# Patient Record
Sex: Female | Born: 1935 | Race: Black or African American | Hispanic: No | Smoking: Former smoker
Health system: Southern US, Community
[De-identification: ages and names within clinical notes are randomized; demographics above are authoritative.]

## PROBLEM LIST (undated history)

## (undated) DIAGNOSIS — N3941 Urge incontinence: Secondary | ICD-10-CM

## (undated) DIAGNOSIS — I5189 Other ill-defined heart diseases: Secondary | ICD-10-CM

## (undated) DIAGNOSIS — N183 Chronic kidney disease, stage 3 unspecified: Secondary | ICD-10-CM

## (undated) DIAGNOSIS — R609 Edema, unspecified: Secondary | ICD-10-CM

## (undated) DIAGNOSIS — Z8719 Personal history of other diseases of the digestive system: Secondary | ICD-10-CM

## (undated) DIAGNOSIS — R12 Heartburn: Secondary | ICD-10-CM

## (undated) DIAGNOSIS — R8281 Pyuria: Secondary | ICD-10-CM

## (undated) DIAGNOSIS — M810 Age-related osteoporosis without current pathological fracture: Secondary | ICD-10-CM

## (undated) DIAGNOSIS — I10 Essential (primary) hypertension: Secondary | ICD-10-CM

## (undated) DIAGNOSIS — R42 Dizziness and giddiness: Secondary | ICD-10-CM

## (undated) DIAGNOSIS — D649 Anemia, unspecified: Secondary | ICD-10-CM

## (undated) DIAGNOSIS — I671 Cerebral aneurysm, nonruptured: Secondary | ICD-10-CM

## (undated) DIAGNOSIS — N059 Unspecified nephritic syndrome with unspecified morphologic changes: Secondary | ICD-10-CM

## (undated) DIAGNOSIS — M47816 Spondylosis without myelopathy or radiculopathy, lumbar region: Secondary | ICD-10-CM

## (undated) DIAGNOSIS — H6980 Other specified disorders of Eustachian tube, unspecified ear: Secondary | ICD-10-CM

## (undated) DIAGNOSIS — R339 Retention of urine, unspecified: Secondary | ICD-10-CM

## (undated) DIAGNOSIS — R350 Frequency of micturition: Secondary | ICD-10-CM

## (undated) DIAGNOSIS — H5450 Low vision, one eye, unspecified eye: Secondary | ICD-10-CM

## (undated) DIAGNOSIS — H6990 Unspecified Eustachian tube disorder, unspecified ear: Secondary | ICD-10-CM

## (undated) HISTORY — DX: Personal history of other diseases of the digestive system: Z87.19

## (undated) HISTORY — DX: Frequency of micturition: R35.0

## (undated) HISTORY — PX: FRACTURE SURGERY: SHX138

## (undated) HISTORY — DX: Spondylosis without myelopathy or radiculopathy, lumbar region: M47.816

## (undated) HISTORY — DX: Essential (primary) hypertension: I10

## (undated) HISTORY — DX: Low vision, one eye, unspecified eye: H54.50

## (undated) HISTORY — DX: Age-related osteoporosis without current pathological fracture: M81.0

## (undated) HISTORY — DX: Edema, unspecified: R60.9

## (undated) HISTORY — DX: Chronic kidney disease, stage 3 unspecified: N18.30

## (undated) HISTORY — PX: TOTAL KNEE ARTHROPLASTY: SHX125

## (undated) HISTORY — DX: Urge incontinence: N39.41

## (undated) HISTORY — DX: Unspecified eustachian tube disorder, unspecified ear: H69.90

## (undated) HISTORY — DX: Other ill-defined heart diseases: I51.89

## (undated) HISTORY — DX: Hypercalcemia: E83.52

## (undated) HISTORY — DX: Cerebral aneurysm, nonruptured: I67.1

## (undated) HISTORY — DX: Retention of urine, unspecified: R33.9

## (undated) HISTORY — DX: Heartburn: R12

## (undated) HISTORY — DX: Pyuria: R82.81

## (undated) HISTORY — DX: Other specified disorders of Eustachian tube, unspecified ear: H69.80

## (undated) HISTORY — DX: Dizziness and giddiness: R42

## (undated) HISTORY — DX: Unspecified nephritic syndrome with unspecified morphologic changes: N05.9

## (undated) HISTORY — PX: OTHER SURGICAL HISTORY: SHX169

## (undated) HISTORY — PX: JOINT REPLACEMENT: SHX530

## (undated) HISTORY — DX: Anemia, unspecified: D64.9

## (undated) HISTORY — DX: Chronic kidney disease, stage 3 (moderate): N18.3

---

## 1965-10-18 HISTORY — PX: VAGINAL HYSTERECTOMY: SUR661

## 2003-11-01 ENCOUNTER — Other Ambulatory Visit: Payer: Self-pay

## 2005-06-24 ENCOUNTER — Ambulatory Visit: Payer: Self-pay | Admitting: Family Medicine

## 2005-08-11 ENCOUNTER — Ambulatory Visit: Payer: Self-pay | Admitting: Neurology

## 2005-08-12 ENCOUNTER — Ambulatory Visit: Payer: Self-pay | Admitting: Neurology

## 2005-09-07 ENCOUNTER — Ambulatory Visit: Payer: Self-pay | Admitting: Neurology

## 2006-06-27 ENCOUNTER — Ambulatory Visit: Payer: Self-pay | Admitting: Family Medicine

## 2007-02-28 ENCOUNTER — Ambulatory Visit: Payer: Self-pay | Admitting: Emergency Medicine

## 2007-08-24 ENCOUNTER — Ambulatory Visit: Payer: Self-pay | Admitting: Family Medicine

## 2008-08-29 ENCOUNTER — Ambulatory Visit: Payer: Self-pay

## 2009-01-15 ENCOUNTER — Ambulatory Visit: Payer: Self-pay

## 2009-09-01 ENCOUNTER — Ambulatory Visit: Payer: Self-pay | Admitting: Family Medicine

## 2010-03-02 ENCOUNTER — Ambulatory Visit: Payer: Self-pay | Admitting: Unknown Physician Specialty

## 2010-05-04 ENCOUNTER — Ambulatory Visit: Payer: Self-pay | Admitting: Unknown Physician Specialty

## 2010-09-24 ENCOUNTER — Ambulatory Visit: Payer: Self-pay | Admitting: Family Medicine

## 2011-09-28 ENCOUNTER — Ambulatory Visit: Payer: Self-pay | Admitting: Family Medicine

## 2011-11-25 ENCOUNTER — Emergency Department: Payer: Self-pay | Admitting: Emergency Medicine

## 2011-11-25 LAB — CBC
MCV: 94 fL (ref 80–100)
Platelet: 134 10*3/uL — ABNORMAL LOW (ref 150–440)
RBC: 3.91 10*6/uL (ref 3.80–5.20)
RDW: 14.8 % — ABNORMAL HIGH (ref 11.5–14.5)
WBC: 4.2 10*3/uL (ref 3.6–11.0)

## 2011-11-25 LAB — COMPREHENSIVE METABOLIC PANEL
Albumin: 3.8 g/dL (ref 3.4–5.0)
Anion Gap: 11 (ref 7–16)
Calcium, Total: 9.6 mg/dL (ref 8.5–10.1)
Chloride: 99 mmol/L (ref 98–107)
Co2: 27 mmol/L (ref 21–32)
EGFR (Non-African Amer.): >60
Glucose: 115 mg/dL — ABNORMAL HIGH (ref 65–99)
Osmolality: 275 (ref 275–301)
Potassium: 3.4 mmol/L — ABNORMAL LOW (ref 3.5–5.1)
Sodium: 137 mmol/L (ref 136–145)
Total Protein: 8.6 g/dL — ABNORMAL HIGH (ref 6.4–8.2)

## 2011-11-25 LAB — URINALYSIS, COMPLETE
Bacteria: NONE SEEN
Bilirubin,UR: NEGATIVE
Blood: NEGATIVE
Nitrite: NEGATIVE
Squamous Epithelial: 1

## 2012-04-06 ENCOUNTER — Ambulatory Visit: Payer: Self-pay | Admitting: General Practice

## 2012-09-29 ENCOUNTER — Ambulatory Visit: Payer: Self-pay | Admitting: Family Medicine

## 2013-08-16 ENCOUNTER — Ambulatory Visit: Payer: Self-pay | Admitting: Unknown Physician Specialty

## 2013-08-20 LAB — PATHOLOGY REPORT

## 2014-07-02 ENCOUNTER — Ambulatory Visit: Payer: Self-pay | Admitting: Family Medicine

## 2015-09-26 ENCOUNTER — Other Ambulatory Visit: Payer: Self-pay | Admitting: Obstetrics and Gynecology

## 2015-09-26 DIAGNOSIS — G458 Other transient cerebral ischemic attacks and related syndromes: Secondary | ICD-10-CM

## 2015-10-02 ENCOUNTER — Ambulatory Visit
Admission: RE | Admit: 2015-10-02 | Discharge: 2015-10-02 | Disposition: A | Discharge status: Home / Self Care | Payer: Medicare HMO | Source: Ambulatory Visit | Attending: Obstetrics and Gynecology | Admitting: Obstetrics and Gynecology

## 2015-10-02 DIAGNOSIS — G458 Other transient cerebral ischemic attacks and related syndromes: Secondary | ICD-10-CM

## 2015-10-02 DIAGNOSIS — I6523 Occlusion and stenosis of bilateral carotid arteries: Secondary | ICD-10-CM | POA: Insufficient documentation

## 2016-04-23 ENCOUNTER — Ambulatory Visit (INDEPENDENT_AMBULATORY_CARE_PROVIDER_SITE_OTHER): Payer: Medicare HMO | Admitting: Urology

## 2016-04-23 ENCOUNTER — Encounter: Payer: Self-pay | Admitting: Urology

## 2016-04-23 VITALS — BP 134/72 | HR 67 | Ht 63.0 in | Wt 166.9 lb

## 2016-04-23 DIAGNOSIS — R339 Retention of urine, unspecified: Secondary | ICD-10-CM

## 2016-04-23 DIAGNOSIS — N183 Chronic kidney disease, stage 3 unspecified: Secondary | ICD-10-CM

## 2016-04-23 DIAGNOSIS — N390 Urinary tract infection, site not specified: Secondary | ICD-10-CM

## 2016-04-23 DIAGNOSIS — N39490 Overflow incontinence: Secondary | ICD-10-CM | POA: Diagnosis not present

## 2016-04-23 LAB — MICROSCOPIC EXAMINATION: Epithelial Cells (non renal): >10 /hpf — AB (ref 0–10)

## 2016-04-23 LAB — URINALYSIS, COMPLETE
Bilirubin, UA: NEGATIVE
Glucose, UA: NEGATIVE
Ketones, UA: NEGATIVE
Nitrite, UA: POSITIVE — AB
PH UA: 7 (ref 5.0–7.5)
SPEC GRAV UA: 1.015 (ref 1.005–1.030)
Urobilinogen, Ur: 0.2 mg/dL (ref 0.2–1.0)

## 2016-04-23 LAB — BLADDER SCAN AMB NON-IMAGING

## 2016-04-23 NOTE — Progress Notes (Signed)
04/23/2016 2:35 PM   [name] 02-16-1931 [id number]  Referring provider: No referring provider defined for this encounter.  Chief Complaint  Patient presents with  . Urinary Retention    referred by Dr. Trellis Moment    HPI: 82 yo F with history of chronic urinary retention, Incontinence, and recurrent urinary tract infections. She was seen in urology clinic a little over 3 years ago at which time she was recommended to clean intermittent catheter. She's been unable to catheterize /noncompliant with this.  More recently, she's had worsening problems with incontinence and was referred back by her PCP.  She does have issues with recurrent UTIs.  She was treated ~1 month ago as well as a few months ago prior.  At that time, she did report that she was having increased urgency and frequency consistent with infection.  She was treated by her PCP Dr. Mancel Bale.  UA today highly suspicious for ongoing infection/colonization. The patient denies fevers, chills, dysuria, gross hematuria, fevers, chills or any other symptoms related to urinary tract infection.  Kelfex and cipro are currently both on her medication list from her pharmacy but she does not think that she is taking these medications anymore.    No personal history of bladder or kidney stones.    She does wear pads and diapers which are often saturated.  She is currently on ditropan 5 mg bid started by her PCP which has not made much difference.  She is wet constantly wet but also has episodes where she'll have a sudden urge and not able to make it to the bathroom in time.  No leakage with physical activity.  In the past, there was some concern for overflow incontinence.    Bladder scan today shows >500 cc initially.  She was able to return to the bathroom but post void bladder scan still greater than 400 cc without sensation fo bladder fullness.   Cr 0.91 4 years ago, now 1.1 today, GFR 53.   PMH: Past Medical  History  Diagnosis Date  . Urinary retention   . Heartburn   . HTN (hypertension)     Surgical History: Past Surgical History  Procedure Laterality Date  . Total knee arthroplasty Bilateral   . Abdominal hysterectomy      Home Medications:    Medication List       This list is accurate as of: 04/23/16  2:35 PM.  Always use your most recent med list.               amLODipine 10 MG tablet  Commonly known as:  NORVASC     atenolol 25 MG tablet  Commonly known as:  TENORMIN     cephALEXin 500 MG capsule  Commonly known as:  KEFLEX  Reported on 04/23/2016     ciprofloxacin 250 MG tablet  Commonly known as:  CIPRO  Reported on 04/23/2016     furosemide 20 MG tablet  Commonly known as:  LASIX  Reported on 04/23/2016     hydrochlorothiazide 25 MG tablet  Commonly known as:  HYDRODIURIL     losartan 100 MG tablet  Commonly known as:  COZAAR     oxybutynin 5 MG tablet  Commonly known as:  DITROPAN        Allergies: No Known Allergies  Family History: Family History  Problem Relation Age of Onset  . Kidney disease Neg Hx   . Bladder Cancer Neg Hx     Social  History:  reports that she has quit smoking. She does not have any smokeless tobacco history on file. She reports that she does not drink alcohol or use illicit drugs.  ROS: UROLOGY Frequent Urination?: Yes Hard to postpone urination?: Yes Burning/pain with urination?: No Get up at night to urinate?: Yes Leakage of urine?: Yes Urine stream starts and stops?: No Trouble starting stream?: Yes Do you have to strain to urinate?: No Blood in urine?: No Urinary tract infection?: No Sexually transmitted disease?: No Injury to kidneys or bladder?: No Painful intercourse?: No Weak stream?: No Currently pregnant?: No Vaginal bleeding?: No Last menstrual period?: n  Gastrointestinal Nausea?: No Vomiting?: No Indigestion/heartburn?: No Diarrhea?: No Constipation?: Yes  Constitutional Fever: No Night  sweats?: No Weight loss?: No Fatigue?: No  Skin Skin rash/lesions?: No Itching?: No  Eyes Blurred vision?: No Double vision?: No  Ears/Nose/Throat Sore throat?: No Sinus problems?: No  Hematologic/Lymphatic Swollen glands?: No Easy bruising?: Yes  Cardiovascular Leg swelling?: No Chest pain?: No  Respiratory Cough?: No Shortness of breath?: No  Endocrine Excessive thirst?: No  Musculoskeletal Back pain?: No Joint pain?: No  Neurological Headaches?: No Dizziness?: No  Psychologic Depression?: No Anxiety?: No  Physical Exam: BP 134/72 mmHg  Pulse 67  Ht 5\' 3"  (1.6 m)  Wt 166 lb 14.4 oz (75.705 kg)  BMI 29.57 kg/m2  Constitutional:  Alert and oriented, No acute distress. HEENT: [place] AT, moist mucus membranes.  Trachea midline, no masses. Cardiovascular: No clubbing, cyanosis, or edema. Respiratory: Normal respiratory effort, no increased work of breathing. GI: Abdomen is soft, nontender, nondistended, no abdominal masses.  No suprapubic tenderness.   GU: No CVA tenderness.  Skin: No rashes, bruises or suspicious lesions. Lymph: No cervical adenopathy. Neurologic: Grossly intact, no focal deficits, moving all 4 extremities. Psychiatric: Normal mood and affect.  Laboratory Data: Lab Results  Component Value Date   WBC 4.2 11/25/2011   HGB 12.0 11/25/2011   HCT 36.7 11/25/2011   MCV 94 11/25/2011   PLT 134* 11/25/2011    Lab Results  Component Value Date   CREATININE 0.91 11/25/2011     Urinalysis Results for orders placed or performed in visit on 04/23/16  Microscopic Examination  Result Value Ref Range   WBC, UA >30 (A) 0 -  5 /hpf   RBC, UA 0-2 0 -  2 /hpf   Epithelial Cells (non renal) >10 (A) 0 - 10 /hpf   Bacteria, UA Few None seen/Few  Urinalysis, Complete  Result Value Ref Range   Specific Gravity, UA 1.015 1.005 - 1.030   pH, UA 7.0 5.0 - 7.5   Color, UA Yellow Yellow   Appearance Ur Cloudy (A) Clear   Leukocytes, UA 3+ (A)  Negative   Protein, UA 2+ (A) Negative/Trace   Glucose, UA Negative Negative   Ketones, UA Negative Negative   RBC, UA 1+ (A) Negative   Bilirubin, UA Negative Negative   Urobilinogen, Ur 0.2 0.2 - 1.0 mg/dL   Nitrite, UA Positive (A) Negative   Microscopic Examination See below:   Basic metabolic panel  Result Value Ref Range   Glucose 96 65 - 99 mg/dL   BUN 16 8 - 27 mg/dL   Creatinine, Ser 1.11 (H) 0.57 - 1.00 mg/dL   GFR calc non Af Amer 46 (L) >59 mL/min/1.73   GFR calc Af Amer 53 (L) >59 mL/min/1.73   BUN/Creatinine Ratio 14 12 - 28   Sodium 137 134 - 144 mmol/L   Potassium  3.7 3.5 - 5.2 mmol/L   Chloride 92 (L) 96 - 106 mmol/L   CO2 25 18 - 29 mmol/L   Calcium 10.1 8.7 - 10.3 mg/dL  BLADDER SCAN AMB NON-IMAGING  Result Value Ref Range   Scan Result 456ml     Pertinent Imaging: Bladder scan as above  Assessment & Plan:    1. Urinary retention Chronic urinary retention which is likely contributing to possible overflow incontinence as well as recurrent urinary tract infections/colonization and may also be underlying cause of worsening renal function.  Ms. Visone was made aware of the above and I expressed my concern about complications of incomplete bladder emptying.    As in the past, highly recommended learning clean intermittent catheterization today. She states that she is unable to do this due to motility issues done knee pain. I have offered her of indwelling Foley catheter which he also refused today. We also discussed the possibility of pursuing a suprapubic catheter which she is not particularly interested in.  As an alternative, she has ageed to try timed voiding every 2 hours during the daytime and double voiding.    - Urinalysis, Complete - BLADDER SCAN AMB NON-IMAGING - Basic metabolic panel  2. Overflow incontinence I have advised Mrs. Marseille to stop Ditropan as this may be contributing to her inability to empty her bladder.  A dditionally,  anticholinergic should be used sparingly and octogenarians due to concern for AMS.  3. Recurrent UTI As above.  Urine not sent for urine culture today as is currently asymptomatic and urine appears to be contaminated (not clean catch).  Will recheck next visit with catheterized specimen but only treat if symptomatic.  Patient advised to return if develops any sign/ symptoms of UTI.  4. CKD (chronic kidney disease) stage 3, GFR 30-59 ml/min New stage 3 CKD, mild renal decompensation over past 4 years which may (or at least partially) secondary to obstructive nephropathy.     Follow up in a few weeks for symptoms recheck, recheck PVR via catheterized urine specimen, pelvic exam  Hollice Espy, MD  [place] [address] [place], [place] [address] [phone]

## 2016-04-24 LAB — BASIC METABOLIC PANEL
BUN/Creatinine Ratio: 14 (ref 12–28)
BUN: 16 mg/dL (ref 8–27)
CHLORIDE: 92 mmol/L — AB (ref 96–106)
CO2: 25 mmol/L (ref 18–29)
CREATININE: 1.11 mg/dL — AB (ref 0.57–1.00)
Calcium: 10.1 mg/dL (ref 8.7–10.3)
GFR calc Af Amer: 53 mL/min/{1.73_m2} — ABNORMAL LOW (ref >59)
GFR calc non Af Amer: 46 mL/min/{1.73_m2} — ABNORMAL LOW (ref >59)
GLUCOSE: 96 mg/dL (ref 65–99)
Potassium: 3.7 mmol/L (ref 3.5–5.2)
Sodium: 137 mmol/L (ref 134–144)

## 2016-05-08 DIAGNOSIS — T1490XA Injury, unspecified, initial encounter: Secondary | ICD-10-CM | POA: Insufficient documentation

## 2016-05-08 DIAGNOSIS — S065XAA Traumatic subdural hemorrhage with loss of consciousness status unknown, initial encounter: Secondary | ICD-10-CM | POA: Insufficient documentation

## 2016-05-08 DIAGNOSIS — G936 Cerebral edema: Secondary | ICD-10-CM | POA: Insufficient documentation

## 2016-05-08 DIAGNOSIS — I609 Nontraumatic subarachnoid hemorrhage, unspecified: Secondary | ICD-10-CM | POA: Insufficient documentation

## 2016-05-08 DIAGNOSIS — D62 Acute posthemorrhagic anemia: Secondary | ICD-10-CM | POA: Insufficient documentation

## 2016-05-08 DIAGNOSIS — S37019A Minor contusion of unspecified kidney, initial encounter: Secondary | ICD-10-CM | POA: Insufficient documentation

## 2016-05-08 DIAGNOSIS — S065X9A Traumatic subdural hemorrhage with loss of consciousness of unspecified duration, initial encounter: Secondary | ICD-10-CM | POA: Insufficient documentation

## 2016-05-08 DIAGNOSIS — N189 Chronic kidney disease, unspecified: Secondary | ICD-10-CM | POA: Insufficient documentation

## 2016-05-08 DIAGNOSIS — I10 Essential (primary) hypertension: Secondary | ICD-10-CM | POA: Insufficient documentation

## 2016-05-08 DIAGNOSIS — T07XXXA Unspecified multiple injuries, initial encounter: Secondary | ICD-10-CM | POA: Insufficient documentation

## 2016-05-09 DIAGNOSIS — S37019A Minor contusion of unspecified kidney, initial encounter: Secondary | ICD-10-CM | POA: Insufficient documentation

## 2016-05-25 DIAGNOSIS — S62314A Displaced fracture of base of fourth metacarpal bone, right hand, initial encounter for closed fracture: Secondary | ICD-10-CM | POA: Insufficient documentation

## 2016-05-25 DIAGNOSIS — S62316A Displaced fracture of base of fifth metacarpal bone, right hand, initial encounter for closed fracture: Secondary | ICD-10-CM | POA: Insufficient documentation

## 2016-05-25 DIAGNOSIS — M9712XA Periprosthetic fracture around internal prosthetic left knee joint, initial encounter: Secondary | ICD-10-CM | POA: Insufficient documentation

## 2016-05-25 DIAGNOSIS — S72402A Unspecified fracture of lower end of left femur, initial encounter for closed fracture: Secondary | ICD-10-CM | POA: Insufficient documentation

## 2016-05-26 ENCOUNTER — Ambulatory Visit: Payer: Medicare HMO | Admitting: Urology

## 2016-06-08 ENCOUNTER — Emergency Department: Payer: Medicare HMO

## 2016-06-08 ENCOUNTER — Observation Stay
Admission: EM | Admit: 2016-06-08 | Discharge: 2016-06-11 | Disposition: A | Discharge status: Skilled Nursing Facility | Payer: Medicare HMO | Attending: Internal Medicine | Admitting: Internal Medicine

## 2016-06-08 ENCOUNTER — Encounter: Payer: Self-pay | Admitting: Emergency Medicine

## 2016-06-08 DIAGNOSIS — M5136 Other intervertebral disc degeneration, lumbar region: Secondary | ICD-10-CM | POA: Insufficient documentation

## 2016-06-08 DIAGNOSIS — X58XXXA Exposure to other specified factors, initial encounter: Secondary | ICD-10-CM | POA: Diagnosis not present

## 2016-06-08 DIAGNOSIS — I6602 Occlusion and stenosis of left middle cerebral artery: Secondary | ICD-10-CM | POA: Diagnosis not present

## 2016-06-08 DIAGNOSIS — Z87891 Personal history of nicotine dependence: Secondary | ICD-10-CM | POA: Insufficient documentation

## 2016-06-08 DIAGNOSIS — R55 Syncope and collapse: Secondary | ICD-10-CM | POA: Diagnosis present

## 2016-06-08 DIAGNOSIS — G9341 Metabolic encephalopathy: Secondary | ICD-10-CM | POA: Insufficient documentation

## 2016-06-08 DIAGNOSIS — L89619 Pressure ulcer of right heel, unspecified stage: Secondary | ICD-10-CM | POA: Diagnosis not present

## 2016-06-08 DIAGNOSIS — I129 Hypertensive chronic kidney disease with stage 1 through stage 4 chronic kidney disease, or unspecified chronic kidney disease: Secondary | ICD-10-CM | POA: Diagnosis not present

## 2016-06-08 DIAGNOSIS — N183 Chronic kidney disease, stage 3 (moderate): Secondary | ICD-10-CM | POA: Diagnosis not present

## 2016-06-08 DIAGNOSIS — L8990 Pressure ulcer of unspecified site, unspecified stage: Secondary | ICD-10-CM | POA: Insufficient documentation

## 2016-06-08 DIAGNOSIS — L89629 Pressure ulcer of left heel, unspecified stage: Secondary | ICD-10-CM | POA: Diagnosis not present

## 2016-06-08 DIAGNOSIS — K219 Gastro-esophageal reflux disease without esophagitis: Secondary | ICD-10-CM | POA: Diagnosis not present

## 2016-06-08 DIAGNOSIS — M810 Age-related osteoporosis without current pathological fracture: Secondary | ICD-10-CM | POA: Diagnosis not present

## 2016-06-08 DIAGNOSIS — N390 Urinary tract infection, site not specified: Principal | ICD-10-CM | POA: Diagnosis present

## 2016-06-08 DIAGNOSIS — S065X9A Traumatic subdural hemorrhage with loss of consciousness of unspecified duration, initial encounter: Secondary | ICD-10-CM | POA: Diagnosis not present

## 2016-06-08 DIAGNOSIS — S066X9A Traumatic subarachnoid hemorrhage with loss of consciousness of unspecified duration, initial encounter: Secondary | ICD-10-CM | POA: Diagnosis not present

## 2016-06-08 DIAGNOSIS — R4189 Other symptoms and signs involving cognitive functions and awareness: Secondary | ICD-10-CM

## 2016-06-08 LAB — CBC
HEMATOCRIT: 25.3 % — AB (ref 35.0–47.0)
Hemoglobin: 8.3 g/dL — ABNORMAL LOW (ref 12.0–16.0)
MCH: 29.3 pg (ref 26.0–34.0)
MCHC: 33 g/dL (ref 32.0–36.0)
MCV: 89 fL (ref 80.0–100.0)
PLATELETS: 226 10*3/uL (ref 150–440)
RBC: 2.85 MIL/uL — ABNORMAL LOW (ref 3.80–5.20)
RDW: 16.6 % — ABNORMAL HIGH (ref 11.5–14.5)
WBC: 13.5 10*3/uL — ABNORMAL HIGH (ref 3.6–11.0)

## 2016-06-08 LAB — COMPREHENSIVE METABOLIC PANEL
ALK PHOS: 96 U/L (ref 38–126)
ALT: 20 U/L (ref 14–54)
AST: 33 U/L (ref 15–41)
Albumin: 3 g/dL — ABNORMAL LOW (ref 3.5–5.0)
Anion gap: 11 (ref 5–15)
BILIRUBIN TOTAL: 0.6 mg/dL (ref 0.3–1.2)
BUN: 43 mg/dL — AB (ref 6–20)
CALCIUM: 9.9 mg/dL (ref 8.9–10.3)
CHLORIDE: 95 mmol/L — AB (ref 101–111)
CO2: 28 mmol/L (ref 22–32)
CREATININE: 1.17 mg/dL — AB (ref 0.44–1.00)
GFR calc Af Amer: 49 mL/min — ABNORMAL LOW (ref >60)
GFR, EST NON AFRICAN AMERICAN: 42 mL/min — AB (ref >60)
Glucose, Bld: 117 mg/dL — ABNORMAL HIGH (ref 65–99)
Potassium: 3.9 mmol/L (ref 3.5–5.1)
Sodium: 134 mmol/L — ABNORMAL LOW (ref 135–145)
Total Protein: 7.4 g/dL (ref 6.5–8.1)

## 2016-06-08 LAB — URINALYSIS COMPLETE WITH MICROSCOPIC (ARMC ONLY)
Bilirubin Urine: NEGATIVE
Glucose, UA: NEGATIVE mg/dL
KETONES UR: NEGATIVE mg/dL
Nitrite: NEGATIVE
PROTEIN: 100 mg/dL — AB
Specific Gravity, Urine: 1.014 (ref 1.005–1.030)
pH: 6 (ref 5.0–8.0)

## 2016-06-08 LAB — TROPONIN I
TROPONIN I: 0.04 ng/mL — AB (ref <0.03)
Troponin I: 0.03 ng/mL (ref <0.03)

## 2016-06-08 LAB — MAGNESIUM: MAGNESIUM: 1.7 mg/dL (ref 1.7–2.4)

## 2016-06-08 MED ORDER — LOSARTAN POTASSIUM 50 MG PO TABS: 100.0000 mg | ORAL_TABLET | Freq: Every day | ORAL | Status: DC

## 2016-06-08 MED ORDER — OXYBUTYNIN CHLORIDE 5 MG PO TABS
5.0000 mg | ORAL_TABLET | Freq: Two times a day (BID) | ORAL | Status: DC
  Administered 2016-06-09 – 2016-06-11 (×5): 5 mg via ORAL
  Filled 2016-06-08 (×6): qty 1

## 2016-06-08 MED ORDER — DEXTROSE 5 % IV SOLN
1.0000 g | INTRAVENOUS | Status: DC
  Administered 2016-06-08 – 2016-06-10 (×3): 1 g via INTRAVENOUS
  Filled 2016-06-08 (×4): qty 10

## 2016-06-08 MED ORDER — FERROUS SULFATE 325 (65 FE) MG PO TABS
325.0000 mg | ORAL_TABLET | Freq: Every day | ORAL | Status: DC
  Administered 2016-06-09 – 2016-06-11 (×3): 325 mg via ORAL
  Filled 2016-06-08 (×3): qty 1

## 2016-06-08 MED ORDER — ADULT MULTIVITAMIN W/MINERALS CH
1.0000 | ORAL_TABLET | Freq: Every day | ORAL | Status: DC
  Administered 2016-06-09 – 2016-06-11 (×3): 1 via ORAL
  Filled 2016-06-08 (×3): qty 1

## 2016-06-08 MED ORDER — ENOXAPARIN SODIUM 40 MG/0.4ML ~~LOC~~ SOLN
40.0000 mg | SUBCUTANEOUS | Status: DC
Start: 2016-06-08 — End: 2016-06-08

## 2016-06-08 MED ORDER — ACETAMINOPHEN 650 MG RE SUPP: 650.0000 mg | Freq: Four times a day (QID) | RECTAL | Status: DC | PRN

## 2016-06-08 MED ORDER — ONDANSETRON HCL 4 MG/2ML IJ SOLN: 4.0000 mg | Freq: Four times a day (QID) | INTRAMUSCULAR | Status: DC | PRN

## 2016-06-08 MED ORDER — AMLODIPINE BESYLATE 5 MG PO TABS: 5.0000 mg | ORAL_TABLET | Freq: Every day | ORAL | Status: DC

## 2016-06-08 MED ORDER — ONDANSETRON HCL 4 MG PO TABS
4.0000 mg | ORAL_TABLET | Freq: Four times a day (QID) | ORAL | Status: DC | PRN
Start: 2016-06-08 — End: 2016-06-11

## 2016-06-08 MED ORDER — SODIUM CHLORIDE 0.9 % IV SOLN
INTRAVENOUS | Status: DC
Start: 2016-06-08 — End: 2016-06-10
  Administered 2016-06-09 – 2016-06-10 (×3): via INTRAVENOUS

## 2016-06-08 MED ORDER — OXYCODONE HCL 5 MG PO TABS: 5.0000 mg | ORAL_TABLET | ORAL | Status: DC | PRN

## 2016-06-08 MED ORDER — GABAPENTIN 300 MG PO CAPS
300.0000 mg | ORAL_CAPSULE | Freq: Three times a day (TID) | ORAL | Status: DC
  Administered 2016-06-09 – 2016-06-11 (×8): 300 mg via ORAL
  Filled 2016-06-08 (×8): qty 1

## 2016-06-08 MED ORDER — ATENOLOL 25 MG PO TABS: 25.0000 mg | ORAL_TABLET | Freq: Every day | ORAL | Status: DC

## 2016-06-08 MED ORDER — ACETAMINOPHEN 325 MG PO TABS: 650.0000 mg | ORAL_TABLET | Freq: Four times a day (QID) | ORAL | Status: DC | PRN
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
## 2016-06-08 NOTE — ED Notes (Signed)
IV attempted by Shirlee Limerick, RN , Shelda Pal, and [employer] unsuccessful. MD Karma Greaser notified
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
## 2016-06-08 NOTE — ED Notes (Addendum)
Radiology called with critical values of CT results, MD Karma Greaser notified at this time

## 2016-06-09 ENCOUNTER — Observation Stay (HOSPITAL_COMMUNITY): Payer: Medicare HMO

## 2016-06-09 ENCOUNTER — Observation Stay: Payer: Medicare HMO

## 2016-06-09 DIAGNOSIS — R55 Syncope and collapse: Secondary | ICD-10-CM

## 2016-06-09 DIAGNOSIS — L8990 Pressure ulcer of unspecified site, unspecified stage: Secondary | ICD-10-CM | POA: Insufficient documentation

## 2016-06-09 LAB — URINE CULTURE: SPECIAL REQUESTS: NORMAL

## 2016-06-09 LAB — TROPONIN I: Troponin I: 0.04 ng/mL (ref <0.03)

## 2016-06-09 LAB — MRSA PCR SCREENING: MRSA by PCR: NEGATIVE

## 2016-06-09 MED ORDER — ENSURE ENLIVE PO LIQD
237.0000 mL | Freq: Three times a day (TID) | ORAL | Status: DC
  Administered 2016-06-09 – 2016-06-11 (×6): 237 mL via ORAL

## 2016-06-09 MED ORDER — LOSARTAN POTASSIUM 50 MG PO TABS
100.0000 mg | ORAL_TABLET | Freq: Every day | ORAL | Status: DC
  Administered 2016-06-10 – 2016-06-11 (×2): 100 mg via ORAL
  Filled 2016-06-09 (×3): qty 2

## 2016-06-09 MED ORDER — AMLODIPINE BESYLATE 5 MG PO TABS
5.0000 mg | ORAL_TABLET | Freq: Every day | ORAL | Status: DC
  Administered 2016-06-09 – 2016-06-10 (×2): 5 mg via ORAL
  Filled 2016-06-09 (×2): qty 1

## 2016-06-09 MED ORDER — GADOBENATE DIMEGLUMINE 529 MG/ML IV SOLN
15.0000 mL | Freq: Once | INTRAVENOUS | Status: AC | PRN
  Administered 2016-06-09: 14 mL via INTRAVENOUS

## 2016-06-09 MED ORDER — ATENOLOL 25 MG PO TABS
25.0000 mg | ORAL_TABLET | Freq: Every day | ORAL | Status: DC
  Administered 2016-06-09 – 2016-06-10 (×2): 25 mg via ORAL
  Filled 2016-06-09 (×2): qty 1

## 2016-06-09 NOTE — Care Management Obs Status (Signed)
MEDICARE OBSERVATION STATUS NOTIFICATION   Patient Details  Name: [name] MRN: [id number] Date of Birth: 11-Nov-1930   Medicare Observation Status Notification Given:  Yes    Jolly Mango, RN 06/09/2016, 9:07 AM
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
## 2016-06-09 NOTE — Progress Notes (Signed)
Paged and text paged dr. Benjie Karvonen to make aware of MRI result,right mca aneurysm measuring 85mm. Also patients blood pressure is back up to 143/61 and hr 103. Waiting for md to return page

## 2016-06-09 NOTE — Clinical Social Work Note (Signed)
MSW received consult that patient is from [facility].  MSW attempted to meet with patient to complete assessment, however she was in a procedure, but her sisters were at bedside.  The patient's sisters confirmed that patient would like to return back to [facility] to continue with her short term rehab.  MSW to complete assessment at another time.  Jones Broom. Norval Morton, MSW [phone]  Mon-Fri 8a-4:30p 06/09/2016 3:41 PM

## 2016-06-09 NOTE — Progress Notes (Signed)
Spoke with dr. Benjie Karvonen regarding blood pressure and mri result. Per md reorder norvasc 5mg  daily, atenolol 25mg  daily, losartan 100mg  daily

## 2016-06-09 NOTE — Consult Note (Signed)
[place] Nurse wound consult note Reason for Consult: Bilateral heel deep tissue injury  Wound type: Deep tissue injury Pressure Ulcer POA: Yes Wound bed: deep purple, soft skin over bilateral heels Drainage (amount, consistency, odor) none Periwound: soft, blanchable intact skin Dressing procedure/placement/frequency: foam heel pads, prevalon boots, strict off loading  Discussed POC with patient and bedside nurse.  Re consult if needed, will not follow at this time. Thank you, Dorna Bloom BSN, RN, CWOCN:

## 2016-06-09 NOTE — Care Management (Addendum)
From [facility]. CSW aware.

## 2016-06-10 DIAGNOSIS — R55 Syncope and collapse: Secondary | ICD-10-CM | POA: Diagnosis not present

## 2016-06-10 MED ORDER — ENSURE ENLIVE PO LIQD: 237.0000 mL | Freq: Three times a day (TID) | ORAL | 12 refills | Status: DC

## 2016-06-10 MED ORDER — LOSARTAN POTASSIUM 100 MG PO TABS: 100.0000 mg | ORAL_TABLET | Freq: Every day | ORAL | 0 refills | Status: DC

## 2016-06-10 MED ORDER — ONE-DAILY MULTI VITAMINS PO TABS: 1.0000 | ORAL_TABLET | Freq: Every day | ORAL | 0 refills | Status: DC

## 2016-06-10 MED ORDER — CEFUROXIME AXETIL 500 MG PO TABS: 500.0000 mg | ORAL_TABLET | Freq: Two times a day (BID) | ORAL | 0 refills | Status: DC

## 2016-06-10 MED ORDER — ATENOLOL 25 MG PO TABS: 25.0000 mg | ORAL_TABLET | Freq: Every day | ORAL | 0 refills | Status: DC

## 2016-06-10 MED ORDER — AMLODIPINE BESYLATE 5 MG PO TABS: 5.0000 mg | ORAL_TABLET | Freq: Every day | ORAL | 0 refills | Status: DC
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
## 2016-06-10 NOTE — Clinical Social Work Note (Addendum)
MSW faxed clinical information to insurance company awaiting insurance authorization for patient to return back to SNF.  MSW to facilitate discharge planning once insurance authorization has been received.  2:30pm  MSW received phone call from [place] ext [phone] from insurance company and they would like an OT evaluation for patient.  MSW contacted physician who ordered OT for patient.  MSW notified OT who are going to see patient as soon as they can.  MSW still awaiting insurance authorization for patient to return back to SNF.  5:00pm  MSW spoke to [employer] and they are requesting a peer to peer from the attending physician and the Market researcher.  MSW gave [facility] attending physician's phone number.  MSW informed [employer], the physician will only be available till 6pm today.  [employer] stated they will call MSW back with update on authorization in the morning.  MSW updated case manager, SNF, bedside nurse, physician, patient and her sister [name] who was at bedside.  MSW to continue to follow patient's progress. throughout discharge planning.  Jones Broom. Duilio Heritage, MSW [phone]  Mon-Fri 8a-4:30p 06/10/2016 1:34 PM

## 2016-06-10 NOTE — NC FL2 (Signed)
[place] LEVEL OF CARE SCREENING TOOL     IDENTIFICATION  Patient Name: [name] Birthdate: [birth year] Sex: female Admission Date (Current Location): 06/08/2016  [place] and [place] Number:  Engineering geologist and Address:  [facility], [address], [place],  [address]      Provider Number: [id number]  Attending Physician Name and Address:  Bettey Costa, MD  Relative Name and Phone Number:       Current Level of Care: Hospital Recommended Level of Care: [place] Prior Approval Number:    Date Approved/Denied:   PASRR Number: [id number]  Discharge Plan: SNF    Current Diagnoses: Patient Active Problem List   Diagnosis Date Noted  . Pressure ulcer 06/09/2016  . Syncope 06/08/2016  . Urinary tract infection 06/08/2016    Orientation RESPIRATION BLADDER Height & Weight     Self  Normal Continent Weight: 161 lb 1.6 oz (73.1 kg) Height:  5\' 6"  (167.6 cm)  BEHAVIORAL SYMPTOMS/MOOD NEUROLOGICAL BOWEL NUTRITION STATUS      Continent Diet (Cardiac )  AMBULATORY STATUS COMMUNICATION OF NEEDS Skin   Limited Assist Verbally PU Stage and Appropriate Care                       Personal Care Assistance Level of Assistance  Bathing, Dressing Bathing Assistance: Limited assistance   Dressing Assistance: Limited assistance     Functional Limitations Info  Sight, Hearing, Speech Sight Info: Adequate Hearing Info: Adequate Speech Info: Adequate    SPECIAL CARE FACTORS FREQUENCY  PT (By licensed PT)     PT Frequency: 5x a week              Contractures Contractures Info: Not present    Additional Factors Info  Code Status, Allergies Code Status Info: Full Code Allergies Info: NKA           Current Medications (06/10/2016):  This is the current hospital active medication list Current Facility-Administered Medications  Medication Dose Route Frequency Provider Last Rate Last Dose  .  0.9 %  sodium chloride infusion   Intravenous Continuous Lytle Butte, MD 75 mL/hr at 06/10/16 [phone]    . acetaminophen (TYLENOL) tablet 650 mg  650 mg Oral Q6H PRN Lytle Butte, MD       Or  . acetaminophen (TYLENOL) suppository 650 mg  650 mg Rectal Q6H PRN Lytle Butte, MD      . amLODipine (NORVASC) tablet 5 mg  5 mg Oral Daily Bettey Costa, MD   5 mg at 06/09/16 1638  . atenolol (TENORMIN) tablet 25 mg  25 mg Oral Daily Bettey Costa, MD   25 mg at 06/09/16 1638  . cefTRIAXone (ROCEPHIN) 1 g in dextrose 5 % 50 mL IVPB  1 g Intravenous Q24H Lytle Butte, MD   1 g at 06/09/16 2120  . feeding supplement (ENSURE ENLIVE) (ENSURE ENLIVE) liquid 237 mL  237 mL Oral TID BM Bettey Costa, MD   237 mL at 06/09/16 2054  . ferrous sulfate tablet 325 mg  325 mg Oral Q breakfast Lytle Butte, MD   325 mg at 06/10/16 0741  . gabapentin (NEURONTIN) capsule 300 mg  300 mg Oral TID Lytle Butte, MD   300 mg at 06/10/16 0941  . losartan (COZAAR) tablet 100 mg  100 mg Oral Daily Bettey Costa, MD   100 mg at 06/10/16 0941  . multivitamin with minerals  tablet 1 tablet  1 tablet Oral Daily Lytle Butte, MD   1 tablet at 06/10/16 0941  . ondansetron (ZOFRAN) tablet 4 mg  4 mg Oral Q6H PRN Lytle Butte, MD       Or  . ondansetron [facility]) injection 4 mg  4 mg Intravenous Q6H PRN Lytle Butte, MD      . oxybutynin (DITROPAN) tablet 5 mg  5 mg Oral BID Lytle Butte, MD   5 mg at 06/10/16 0941  . oxyCODONE (Oxy IR/ROXICODONE) immediate release tablet 5 mg  5 mg Oral Q4H PRN Lytle Butte, MD         Discharge Medications: Please see discharge summary for a list of discharge medications.  Relevant Imaging Results:  Relevant Lab Results:   Additional Information SSN [id number]  Ross Ludwig

## 2016-06-10 NOTE — Progress Notes (Signed)
Awake but confused. Very withdrawn, not speaking much. Refused to take nighttime PO meds. No s/s distress. Slept well through the night. Foley intact. IV fluids infusing.

## 2016-06-10 NOTE — Clinical Social Work Note (Signed)
Clinical Social Work Assessment  Patient Details  Name: [name] MRN: [id number] Date of Birth: Dec 21, 1930  Date of referral:  06/10/16               Reason for consult:  Facility Placement                Permission sought to share information with:  Facility Sport and exercise psychologist, Family Supports Permission granted to share information::  Yes, Verbal Permission Granted  Name::     Silvio Pate [id number]  [phone]   Agency::  SNF admissions  Relationship::     Contact Information:     Housing/Transportation Living arrangements for the past 2 months:  [place] of Information:  Patient, Other (Comment Required), Medical Team (Sister) Patient Interpreter Needed:  None Criminal Activity/Legal Involvement Pertinent to Current Situation/Hospitalization:    Significant Relationships:  Other Family Members Lives with:  Facility Resident Do you feel safe going back to the place where you live?  Yes Need for family participation in patient care:  Yes (Comment) (Patient has some confusion)  Care giving concerns:  Patient and family do not have any concerns about returning to [facility].   Social Worker assessment / plan:  Patient is an 82 year old female who is at [facility] [place] for short term rehab.  Patient is alert x3 and able to express herself.  Patient has some confusion but she is aware of what happened and where she is at.  Patient expressed she has been at [facility] for awhile, and is getting therapy there.  Patient states she does not have any issues with patient returning back to SNF.  Patient is familiar with process of going to SNF, MSW explained to her process for discharging back to SNF.  Patient expressed she did not have any other questions or concerns.  Patient states she is hopeful to discharge soon to continue with her therapy.  Employment status:  Retired Nurse, adult PT  Recommendations:  [place] / Referral to community resources:  [place]  Patient/Family's Response to care: Patient and family agreeable to returning back to SNF.  Patient/Family's Understanding of and Emotional Response to Diagnosis, Current Treatment, and Prognosis:  Patient has mild confusion but expressed that she is hopeful that she can begin walking again.  Patient expressed frustration with not healing quick enough, even though she stated she feels like she is doing better.  Patient is looking forward to getting back to rehab so she can return back home.   Emotional Assessment Appearance:  Appears stated age Attitude/Demeanor/Rapport:    Affect (typically observed):  Calm, Pleasant Orientation:  Oriented to Self Alcohol / Substance use:  Not Applicable Psych involvement (Current and /or in the community):  No (Comment)  Discharge Needs  Concerns to be addressed:  No discharge needs identified Readmission within the last 30 days:  No Current discharge risk:  None Barriers to Discharge:  No Barriers Identified   Ross Ludwig 06/10/2016, 11:45 AM
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
## 2016-06-11 NOTE — Procedures (Signed)
Patient remains alert and oriented, denies any pain at this time, VSS,  Mood calm, right hand splint in place, MD at bedside, patient updated about plan of care. NO distress noted
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
## 2016-06-11 NOTE — Progress Notes (Signed)
Patient is being discharge to SNF, patient and family informed by Education officer, museum. IV removed tele removed, report called to receiving nurse .

## 2016-07-14 ENCOUNTER — Telehealth: Payer: Self-pay | Admitting: Radiology

## 2016-07-14 ENCOUNTER — Ambulatory Visit (INDEPENDENT_AMBULATORY_CARE_PROVIDER_SITE_OTHER): Payer: Medicare HMO | Admitting: Urology

## 2016-07-14 ENCOUNTER — Other Ambulatory Visit: Payer: Self-pay | Admitting: Radiology

## 2016-07-14 ENCOUNTER — Encounter: Payer: Self-pay | Admitting: Urology

## 2016-07-14 VITALS — BP 180/80 | HR 80 | Ht 63.0 in | Wt 160.0 lb

## 2016-07-14 DIAGNOSIS — R339 Retention of urine, unspecified: Secondary | ICD-10-CM | POA: Diagnosis not present

## 2016-07-14 DIAGNOSIS — N183 Chronic kidney disease, stage 3 unspecified: Secondary | ICD-10-CM

## 2016-07-14 DIAGNOSIS — N390 Urinary tract infection, site not specified: Secondary | ICD-10-CM

## 2016-07-14 NOTE — Telephone Encounter (Signed)
Notified Pat, pt's nurse at [employer], as well as pt's sister, Mariane Duval, of SPT placement scheduled on 07/23/16 with arrival time to [facility] of 10:30. Advised that pt should be npo after mn except she should take amlodipine, atenolol & losartan with a sip of water. Both Pat & [name] voice understanding.
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
## 2016-07-16 ENCOUNTER — Ambulatory Visit: Payer: Medicare HMO

## 2016-07-22 ENCOUNTER — Other Ambulatory Visit: Payer: Self-pay | Admitting: General Surgery

## 2016-07-23 ENCOUNTER — Other Ambulatory Visit: Payer: Self-pay | Admitting: Urology

## 2016-07-23 ENCOUNTER — Ambulatory Visit
Admission: RE | Admit: 2016-07-23 | Discharge: 2016-07-23 | Disposition: A | Discharge status: Home / Self Care | Payer: Medicare HMO | Source: Ambulatory Visit | Attending: Urology | Admitting: Urology

## 2016-07-23 DIAGNOSIS — R339 Retention of urine, unspecified: Secondary | ICD-10-CM | POA: Insufficient documentation

## 2016-07-23 MED ORDER — SODIUM CHLORIDE 0.9 % IV SOLN
INTRAVENOUS | Status: DC
  Administered 2016-07-23: 12:00:00 via INTRAVENOUS

## 2016-07-23 MED ORDER — FENTANYL CITRATE (PF) 100 MCG/2ML IJ SOLN
INTRAMUSCULAR | Status: AC | PRN
  Administered 2016-07-23: 50 ug via INTRAVENOUS

## 2016-07-23 MED ORDER — MIDAZOLAM HCL 5 MG/5ML IJ SOLN
INTRAMUSCULAR | Status: AC | PRN
  Administered 2016-07-23 (×2): 1 mg via INTRAVENOUS

## 2016-07-23 MED ORDER — HYDROCODONE-ACETAMINOPHEN 5-325 MG PO TABS: 1.0000 | ORAL_TABLET | ORAL | Status: DC | PRN

## 2016-07-23 NOTE — Procedures (Signed)
Under CT guidance, 59F drainage catheter placed into urinary bladder using suprapubic approach. No immediate complication.

## 2016-07-23 NOTE — Procedures (Signed)
Procedure and risks discussed with patient. Informed consent obtained. Will perform CT-guided suprapubic catheter placement.

## 2016-08-02 ENCOUNTER — Telehealth: Payer: Self-pay

## 2016-08-02 NOTE — Telephone Encounter (Signed)
Notified Elmyra Ricks, pt's caregiver at [employer], that appt for SPT exchange has been scheduled for 08/05/16 with arrival time of 1:30 to [facility] Registration desk. Advised that pt should be npo after 7:00am on 08/05/16 except she should take amlodipine, atenolol & losartan with a sip of water. Elmyra Ricks voices understanding. Also notified pt's sister, Mariane Duval of the same.

## 2016-08-02 NOTE — Telephone Encounter (Signed)
Cathy caregiver from [employer] called stating that the patient 12" Suprapubic placed on 10/6 by specials is no longer draining as of this morning. They attempted to irrigate the the SP, but was unsuccessful. The caregiver states the catheter is full of sediment and leaking around the catheter. I spoke w/ Larene Beach she instructed that they cath the patient and call us back with her urine volume.   Tye Maryland called back stating Foley catheter was placed and 250cc of urine volume. She stated the urine appearance  hazy w/ a lot of sediment. She also stated the pt was asymptomatic w/ no dysuria, discomfort. I informed Tye Maryland that I will inform Larene Beach and give her a call back with her recommendation. Tye Maryland # [phone]

## 2016-08-05 ENCOUNTER — Ambulatory Visit
Admission: RE | Admit: 2016-08-05 | Discharge: 2016-08-05 | Disposition: A | Discharge status: Home / Self Care | Payer: Medicare HMO | Source: Ambulatory Visit | Attending: Urology | Admitting: Urology

## 2016-08-05 DIAGNOSIS — R339 Retention of urine, unspecified: Secondary | ICD-10-CM | POA: Diagnosis present

## 2016-08-05 HISTORY — PX: IR GENERIC HISTORICAL: IMG1180011

## 2016-08-05 MED ORDER — IOPAMIDOL (ISOVUE-300) INJECTION 61%: 10.0000 mL | Freq: Once | INTRAVENOUS | Status: DC | PRN

## 2016-08-05 NOTE — OR Nursing (Signed)
Foley bag leaking, dragging floor under wheelchair, changed new bag.

## 2016-08-05 NOTE — Discharge Instructions (Signed)
Suprapubic Catheter Replacement, Care After Refer to this sheet in the next few weeks. These instructions provide you with information on caring for yourself after changing your catheter. Your caregiver may also give you more specific instructions. Call your caregiver if you have any problems or questions after you change your catheter. HOME CARE INSTRUCTIONS   Take all medicines prescribed by your caregiver. Follow the directions carefully.  Drink 8 glasses of water every day. This produces good urine flow.  Check the skin around your catheter a few times every day. Watch for redness and swelling. Look for any fluids coming out of the opening.  Do not use powder or cream around the catheter opening.  Do not take tub baths or use pools or hot tubs.  Keep all follow-up appointments. SEEK MEDICAL CARE IF:   You leak urine.  Your skin around the catheter becomes red or sore.  Your urine flow slows down.  Your urine gets cloudy or smelly. SEEK IMMEDIATE MEDICAL CARE IF:   You have chills, nausea, or back pain.  You have trouble changing your catheter.  Your catheter comes out.  You have blood in your urine.  You have no urine flow for 1 hour.  You have a fever.   This information is not intended to replace advice given to you by your health care provider. Make sure you discuss any questions you have with your health care provider.   please flush suprapubic catheter daily with 10 ml normal saline or sterile water, do not aspirate    Document Released: 06/22/2011 Document Revised: 10/25/2014 Document Reviewed: 06/22/2011 Elsevier Interactive Patient Education 2016 [employer].

## 2016-09-01 ENCOUNTER — Ambulatory Visit: Payer: Medicare HMO | Admitting: Urology

## 2016-09-01 ENCOUNTER — Encounter: Payer: Self-pay | Admitting: Urology

## 2016-09-01 VITALS — BP 166/82 | HR 66

## 2016-09-01 DIAGNOSIS — R339 Retention of urine, unspecified: Secondary | ICD-10-CM

## 2016-09-01 DIAGNOSIS — R8299 Other abnormal findings in urine: Secondary | ICD-10-CM

## 2016-09-01 DIAGNOSIS — T83518A Infection and inflammatory reaction due to other urinary catheter, initial encounter: Principal | ICD-10-CM

## 2016-09-01 DIAGNOSIS — N390 Urinary tract infection, site not specified: Secondary | ICD-10-CM

## 2016-09-01 NOTE — Progress Notes (Signed)
09/01/2016 2:21 PM   [name] 1931-01-22 [id number]  Referring provider: [name], MD [address] [place], [place] [address]  Chief Complaint  Patient presents with  . Follow-up    urinary retention, supapubic     HPI: The patient is an 82 year old female who is managed with a suprapubic catheter who presents today for chief complaint of purple urine.  She is currently having no urinary symptoms at this time. She has no fevers, altered mental status, dysuria, flank pain, or suprapubic discomfort. She does want to check to make sure things wrong since her urine was purple. She currently has a 12 French suprapubic tube placed by interventional radiology. She is scheduled to undergo a catheter upsize in approximately 2 weeks.   PMH: Past Medical History:  Diagnosis Date  . Anemia   . Chronic kidney disease, stage 3   . Degenerative joint disease (DJD) of lumbar spine   . Diastolic dysfunction   . Dizziness   . Edema   . ETD (eustachian tube dysfunction)   . Heartburn   . History of rectal bleeding   . HTN (hypertension)   . Hypercalcemia   . Intracranial aneurysm   . Low vision, one eye   . Nephritis and nephropathy   . Osteoporosis   . Pyuria   . Urge incontinence   . Urinary frequency   . Urinary retention     Surgical History: Past Surgical History:  Procedure Laterality Date  . dilation of esophageal web    . FRACTURE SURGERY Left    femur  . IR GENERIC HISTORICAL  08/05/2016   IR CATHETER TUBE CHANGE 08/05/2016 [name], MD [facility]-INTERV RAD  . JOINT REPLACEMENT Bilateral   . TOTAL KNEE ARTHROPLASTY Bilateral   . VAGINAL HYSTERECTOMY  1967   ovaries also removed because of fibroida    Home Medications:    Medication List       Accurate as of 09/01/16  2:21 PM. Always use your most recent med list.          acetaminophen 325 MG tablet Commonly known as:  TYLENOL Take 650 mg by mouth 2 (two) times daily as needed.   amLODipine 5  MG tablet Commonly known as:  NORVASC Take 1 tablet (5 mg total) by mouth daily.   atenolol 25 MG tablet Commonly known as:  TENORMIN Take 1 tablet (25 mg total) by mouth daily.   clindamycin 300 MG capsule Commonly known as:  CLEOCIN   ferrous sulfate 325 (65 FE) MG tablet Take 325 mg by mouth daily with breakfast.   gabapentin 300 MG capsule Commonly known as:  NEURONTIN Take 300 mg by mouth 3 (three) times daily.   losartan 100 MG tablet Commonly known as:  COZAAR Take 1 tablet (100 mg total) by mouth daily.   multivitamin tablet Take 1 tablet by mouth daily.   silver sulfADIAZINE 1 % cream Commonly known as:  SILVADENE       Allergies: No Known Allergies  Family History: Family History  Problem Relation Age of Onset  . Kidney disease Neg Hx   . Bladder Cancer Neg Hx     Social History:  reports that she quit smoking about 52 years ago. She has never used smokeless tobacco. She reports that she does not drink alcohol or use drugs.  ROS: UROLOGY Frequent Urination?: No Hard to postpone urination?: No Burning/pain with urination?: No Get up at night to urinate?: No Leakage of urine?: No  Urine stream starts and stops?: No Trouble starting stream?: No Do you have to strain to urinate?: No Blood in urine?: No Urinary tract infection?: No Sexually transmitted disease?: No Injury to kidneys or bladder?: No Painful intercourse?: No Weak stream?: No Currently pregnant?: No Vaginal bleeding?: No Last menstrual period?: n  Gastrointestinal Nausea?: No Vomiting?: No Indigestion/heartburn?: No Diarrhea?: No Constipation?: No  Constitutional Fever: No Night sweats?: No Weight loss?: No Fatigue?: No  Skin Skin rash/lesions?: No Itching?: No  Eyes Blurred vision?: No Double vision?: No  Ears/Nose/Throat Sore throat?: No Sinus problems?: No  Hematologic/Lymphatic Swollen glands?: No Easy bruising?: No  Cardiovascular Leg swelling?:  Yes Chest pain?: No  Respiratory Cough?: No Shortness of breath?: No  Endocrine Excessive thirst?: No  Musculoskeletal Back pain?: No Joint pain?: No  Neurological Headaches?: No Dizziness?: No  Psychologic Depression?: No Anxiety?: No  Physical Exam: BP (!) 166/82   Pulse 66   Constitutional:  Alert and oriented, No acute distress. HEENT: [place] AT, moist mucus membranes.  Trachea midline, no masses. Cardiovascular: No clubbing, cyanosis, or edema. Respiratory: Normal respiratory effort, no increased work of breathing. GI: Abdomen is soft, nontender, nondistended, no abdominal masses GU: No CVA tenderness. 12 French superpubic catheter in place. Urine is purple and the back consistent with purple urine bag syndrome. Skin: No rashes, bruises or suspicious lesions. Lymph: No cervical or inguinal adenopathy. Neurologic: Grossly intact, no focal deficits, moving all 4 extremities. Psychiatric: Normal mood and affect.  Laboratory Data: Lab Results  Component Value Date   WBC 13.5 (H) 06/08/2016   HGB 8.3 (L) 06/08/2016   HCT 25.3 (L) 06/08/2016   MCV 89.0 06/08/2016   PLT 226 06/08/2016    Lab Results  Component Value Date   CREATININE 1.17 (H) 06/08/2016    No results found for: PSA  No results found for: TESTOSTERONE  No results found for: HGBA1C  Urinalysis    Component Value Date/Time   COLORURINE YELLOW (A) 06/08/2016 1740   APPEARANCEUR HAZY (A) 06/08/2016 1740   APPEARANCEUR Cloudy (A) 04/23/2016 1451   LABSPEC 1.014 06/08/2016 1740   LABSPEC 1.011 11/25/2011 1204   PHURINE 6.0 06/08/2016 1740   GLUCOSEU NEGATIVE 06/08/2016 1740   GLUCOSEU Negative 11/25/2011 1204   HGBUR 2+ (A) 06/08/2016 1740   BILIRUBINUR NEGATIVE 06/08/2016 1740   BILIRUBINUR Negative 04/23/2016 1451   BILIRUBINUR Negative 11/25/2011 1204   KETONESUR NEGATIVE 06/08/2016 1740   PROTEINUR 100 (A) 06/08/2016 1740   NITRITE NEGATIVE 06/08/2016 1740   LEUKOCYTESUR 3+ (A)  06/08/2016 1740   LEUKOCYTESUR 3+ (A) 04/23/2016 1451   LEUKOCYTESUR Negative 11/25/2011 1204      Assessment & Plan:   1. Purple urine bag syndrome 2. Urinary retention I discussed with the patient that having purple urine with a chronic SP tube is very rare phenomenon but it is harmless. This results from colonized bacteria in the bladder converting tryptophan into indigo. This does not need to be treated in the asymptomatic situation as the bacteria will likely come back and the urine will return to purple in color. This puts her at risk for unnecessary antibiotic resistance. She is scheduled to upsize her SP tube in a couple weeks with IR. She should continue with this appointment. She can follow up in our office as previously scheduled.  Return for as previously scheduled.  Nickie Retort, MD  [facility] [address] [place], [place] [address] [phone]

## 2016-09-01 NOTE — Progress Notes (Signed)
Supapubic catheter Irrigation Instilled and irrigated the 12' supapubic tube r with a 10 ml syringe through the night bag catheter port..  After irrigation urine flow was noted no complications were noted catheter is now draining fine.  Catheter was reattached to the night bag for drainage. Patient tolerated well.   Preformed by: K.Liyana Suniga,CMA

## 2016-09-03 ENCOUNTER — Ambulatory Visit: Admission: RE | Admit: 2016-09-03 | Payer: Medicare HMO | Source: Ambulatory Visit

## 2016-09-06 ENCOUNTER — Encounter: Payer: Medicare HMO | Attending: Surgery | Admitting: Surgery

## 2016-09-06 DIAGNOSIS — Z87891 Personal history of nicotine dependence: Secondary | ICD-10-CM | POA: Diagnosis not present

## 2016-09-06 DIAGNOSIS — L89620 Pressure ulcer of left heel, unstageable: Secondary | ICD-10-CM | POA: Diagnosis present

## 2016-09-06 DIAGNOSIS — L97512 Non-pressure chronic ulcer of other part of right foot with fat layer exposed: Secondary | ICD-10-CM | POA: Diagnosis not present

## 2016-09-06 DIAGNOSIS — L89610 Pressure ulcer of right heel, unstageable: Secondary | ICD-10-CM | POA: Diagnosis not present

## 2016-09-06 DIAGNOSIS — Z993 Dependence on wheelchair: Secondary | ICD-10-CM | POA: Diagnosis not present

## 2016-09-06 DIAGNOSIS — I129 Hypertensive chronic kidney disease with stage 1 through stage 4 chronic kidney disease, or unspecified chronic kidney disease: Secondary | ICD-10-CM | POA: Diagnosis not present

## 2016-09-06 DIAGNOSIS — N189 Chronic kidney disease, unspecified: Secondary | ICD-10-CM | POA: Diagnosis not present

## 2016-09-06 NOTE — Progress Notes (Signed)
[name], [name] ([id number]) Visit Report for 09/06/2016 Abuse/Suicide Risk Screen Details Patient Name: [name], [name]. Date of Service: 09/06/2016 8:45 AM Medical Record Number: [id number] Patient Account Number: [id number] Date of Birth/Sex: Jul 06, 1931 (82 y.o. Female) Treating RN: Cornell Barman Primary Care Physician: [name] Other Clinician: Referring Physician: [name] Treating Physician/Extender: Frann Rider in Treatment: 0 Abuse/Suicide Risk Screen Items Answer ABUSE/SUICIDE RISK SCREEN: Has anyone close to you tried to hurt or harm you recentlyo No Do you feel uncomfortable with anyone in your familyo No Has anyone forced you do things that you didnot want to doo No Do you have any thoughts of harming yourselfo No Patient displays signs or symptoms of abuse and/or neglect. No Electronic Signature(s) Signed: 09/06/2016 1:17:45 PM By: Gretta Cool, RN, BSN, Kim RN, BSN Entered By: Gretta Cool, RN, BSN, Kim on 09/06/2016 09:54:37 Urquilla, [name] ([id number]) -------------------------------------------------------------------------------- Activities of Daily Living Details Patient Name: [name], SCAFF. Date of Service: 09/06/2016 8:45 AM Medical Record Number: [id number] Patient Account Number: [id number] Date of Birth/Sex: 04-20-1931 (82 y.o. Female) Treating RN: Cornell Barman Primary Care Physician: [name] Other Clinician: Referring Physician: [name] Treating Physician/Extender: Frann Rider in Treatment: 0 Activities of Daily Living Items Answer Activities of Daily Living (Please select one for each item) Drive Automobile Not Able Take Medications Completely Able Use Telephone Completely Able Care for Appearance Need Assistance Use Toilet Need Assistance Bath / Shower Need Assistance Dress Self Need Assistance Feed Self Completely Able Walk Not Able Get In / Out Bed Not Able Housework Not Able Prepare Meals Not Able Handle  Money Completely Able Shop for Self Need Assistance Electronic Signature(s) Signed: 09/06/2016 1:17:45 PM By: Gretta Cool, RN, BSN, Kim RN, BSN Entered By: Gretta Cool, RN, BSN, Kim on 09/06/2016 09:55:22 [name] ([id number]) -------------------------------------------------------------------------------- Education Assessment Details Patient Name: [name] Date of Service: 09/06/2016 8:45 AM Medical Record Number: [id number] Patient Account Number: [id number] Date of Birth/Sex: [birth year] (82 y.o. Female) Treating RN: Cornell Barman Primary Care Physician: [name] Other Clinician: Referring Physician: [name] Treating Physician/Extender: Frann Rider in Treatment: 0 Primary Learner Assessed: Patient Learning Preferences/Education Level/Primary Language Learning Preference: Explanation Highest Education Level: High School Preferred Language: English Cognitive Barrier Assessment/Beliefs Language Barrier: No Translator Needed: No Memory Deficit: No Emotional Barrier: No Cultural/Religious Beliefs Affecting Medical No Care: Physical Barrier Assessment Impaired Vision: No Impaired Hearing: No Decreased Hand dexterity: No Knowledge/Comprehension Assessment Knowledge Level: High Comprehension Level: High Ability to understand written High instructions: Ability to understand verbal High instructions: Motivation Assessment Anxiety Level: Calm Cooperation: Cooperative Education Importance: Acknowledges Need Interest in Health Problems: Asks Questions Perception: Coherent Willingness to Engage in Self- High Management Activities: Readiness to Engage in Self- High Management Activities: Electronic Signature(s) [name], [name] ([id number]) Signed: 09/06/2016 1:17:45 PM By: Gretta Cool, RN, BSN, Kim RN, BSN Entered By: Gretta Cool, RN, BSN, Kim on 09/06/2016 09:55:53 [name]  ([id number]) -------------------------------------------------------------------------------- Fall Risk Assessment Details Patient Name: [name]. Date of Service: 09/06/2016 8:45 AM Medical Record Number: [id number] Patient Account Number: [id number] Date of Birth/Sex: 1931/01/19 (82 y.o. Female) Treating RN: Cornell Barman Primary Care Physician: [name] Other Clinician: Referring Physician: [name] Treating Physician/Extender: Frann Rider in Treatment: 0 Fall Risk Assessment Items Have you had 2 or more falls in the last 12 monthso 0 No Have you had any fall that resulted in injury in the last 12 monthso 0 No FALL RISK ASSESSMENT: History of falling - immediate or within 3 months  0 No Secondary diagnosis 15 Yes Ambulatory aid None/bed rest/wheelchair/nurse 0 Yes Crutches/cane/walker 0 No Furniture 0 No IV Access/Saline Lock 0 No Gait/Training Normal/bed rest/immobile 0 Yes Weak 0 No Impaired 0 No Mental Status Oriented to own ability 0 Yes Electronic Signature(s) Signed: 09/06/2016 1:17:45 PM By: Gretta Cool, RN, BSN, Kim RN, BSN Entered By: Gretta Cool, RN, BSN, Kim on 09/06/2016 09:56:21 [name] ([id number]) -------------------------------------------------------------------------------- Nutrition Risk Assessment Details Patient Name: [name]. Date of Service: 09/06/2016 8:45 AM Medical Record Number: [id number] Patient Account Number: [id number] Date of Birth/Sex: [birth year] (82 y.o. Female) Treating RN: Cornell Barman Primary Care Physician: [name] Other Clinician: Referring Physician: [name] Treating Physician/Extender: Frann Rider in Treatment: 0 Height (in): 63 Weight (lbs): 160 Body Mass Index (BMI): 28.3 Nutrition Risk Assessment Items NUTRITION RISK SCREEN: I have an illness or condition that made me change the kind and/or 0 No amount of food I eat I eat fewer than two meals per day 0 No I eat few  fruits and vegetables, or milk products 0 No I have three or more drinks of beer, liquor or wine almost every day 0 No I have tooth or mouth problems that make it hard for me to eat 0 No I don't always have enough money to buy the food I need 0 No I eat alone most of the time 0 No I take three or more different prescribed or over-the-counter drugs a 1 Yes day Without wanting to, I have lost or gained 10 pounds in the last six 0 No months I am not always physically able to shop, cook and/or feed myself 0 No Nutrition Protocols Good Risk Protocol 0 No interventions needed Moderate Risk Protocol Electronic Signature(s) Signed: 09/06/2016 1:17:45 PM By: Gretta Cool, RN, BSN, Kim RN, BSN Entered By: Gretta Cool, RN, BSN, Kim on 09/06/2016 09:56:30
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
## 2016-09-06 NOTE — Progress Notes (Signed)
ANJALINA, BERGEVIN ([id number]) Visit Report for 09/06/2016 Allergy List Details Patient Name: [name], [name]. Date of Service: 09/06/2016 8:45 AM Medical Record Number: [id number] Patient Account Number: [id number] Date of Birth/Sex: [birth year] (82 y.o. Female) Treating RN: Cornell Barman Primary Care Physician: [name] Other Clinician: Referring Physician: [name] Treating Physician/Extender: Frann Rider in Treatment: 0 Allergies Active Allergies No Known Drug Allergies Allergy Notes Electronic Signature(s) Signed: 09/06/2016 1:17:45 PM By: Gretta Cool, RN, BSN, Kim RN, BSN Entered By: Gretta Cool, RN, BSN, Kim on 09/06/2016 09:49:37 [name] ([id number]) -------------------------------------------------------------------------------- Arrival Information Details Patient Name: [name] Date of Service: 09/06/2016 8:45 AM Medical Record Number: [id number] Patient Account Number: [id number] Date of Birth/Sex: [birth year] (82 y.o. Female) Treating RN: Cornell Barman Primary Care Physician: [name] Other Clinician: Referring Physician: [name] Treating Physician/Extender: Frann Rider in Treatment: 0 Visit Information Patient Arrived: Ambulatory Arrival Time: 09:08 Accompanied By: sister Transfer Assistance: Hoyer Lift Patient Identification Verified: Yes Secondary Verification Process Yes Completed: Patient Requires Transmission-Based No Precautions: Patient Has Alerts: No Electronic Signature(s) Signed: 09/06/2016 1:17:45 PM By: Gretta Cool, RN, BSN, Kim RN, BSN Entered By: Gretta Cool, RN, BSN, Kim on 09/06/2016 09:11:06 [name] ([id number]) -------------------------------------------------------------------------------- Clinic Level of Care Assessment Details Patient Name: [name] Date of Service: 09/06/2016 8:45 AM Medical Record Number: [id number] Patient Account Number: [id number] Date of Birth/Sex: 1931-04-18 (82 y.o.  Female) Treating RN: Cornell Barman Primary Care Physician: [name] Other Clinician: Referring Physician: [name] Treating Physician/Extender: Frann Rider in Treatment: 0 Clinic Level of Care Assessment Items TOOL 2 Quantity Score []  - Use when only an EandM is performed on the INITIAL visit 0 ASSESSMENTS - Nursing Assessment / Reassessment []  - General Physical Exam (combine w/ comprehensive assessment (listed just 0 below) when performed on new pt. evals) []  - Comprehensive Assessment (HX, ROS, Risk Assessments, Wounds Hx, etc.) 0 ASSESSMENTS - Wound and Skin Assessment / Reassessment []  - Simple Wound Assessment / Reassessment - one wound 0 X - Complex Wound Assessment / Reassessment - multiple wounds 6 5 []  - Dermatologic / Skin Assessment (not related to wound area) 0 ASSESSMENTS - Ostomy and/or Continence Assessment and Care []  - Incontinence Assessment and Management 0 []  - Ostomy Care Assessment and Management (repouching, etc.) 0 PROCESS - Coordination of Care []  - Simple Patient / Family Education for ongoing care 0 X - Complex (extensive) Patient / Family Education for ongoing care 1 20 X - Staff obtains Programmer, systems, Records, Test Results / Process Orders 1 10 X - Staff telephones HHA, Nursing Homes / Clarify orders / etc 1 10 []  - Routine Transfer to another Facility (non-emergent condition) 0 []  - Routine Hospital Admission (non-emergent condition) 0 []  - New Admissions / Biomedical engineer / Ordering NPWT, Apligraf, etc. 0 []  - Emergency Hospital Admission (emergent condition) 0 X - Simple Discharge Coordination 1 10 [name], Danielle S. ([id number]) []  - Complex (extensive) Discharge Coordination 0 PROCESS - Special Needs []  - Pediatric / Minor Patient Management 0 []  - Isolation Patient Management 0 []  - Hearing / Language / Visual special needs 0 []  - Assessment of Community assistance (transportation, D/C planning, etc.) 0 []  - Additional  assistance / Altered mentation 0 []  - Support Surface(s) Assessment (bed, cushion, seat, etc.) 0 INTERVENTIONS - Wound Cleansing / Measurement X - Wound Imaging (photographs - any number of wounds) 1 5 []  - Wound Tracing (instead of photographs) 0 []  - Simple Wound Measurement - one wound 0 X -  Complex Wound Measurement - multiple wounds 6 5 []  - Simple Wound Cleansing - one wound 0 X - Complex Wound Cleansing - multiple wounds 6 5 INTERVENTIONS - Wound Dressings X - Small Wound Dressing one or multiple wounds 4 10 X - Medium Wound Dressing one or multiple wounds 2 15 []  - Large Wound Dressing one or multiple wounds 0 []  - Application of Medications - injection 0 INTERVENTIONS - Miscellaneous []  - External ear exam 0 []  - Specimen Collection (cultures, biopsies, blood, body fluids, etc.) 0 []  - Specimen(s) / Culture(s) sent or taken to Lab for analysis 0 []  - Patient Transfer (multiple staff / Civil Service fast streamer / Similar devices) 0 []  - Simple Staple / Suture removal (25 or less) 0 []  - Complex Staple / Suture removal (26 or more) 0 [name], Danielle S. ([id number]) []  - Hypo / Hyperglycemic Management (close monitor of Blood Glucose) 0 X - Ankle / Brachial Index (ABI) - do not check if billed separately 1 15 Has the patient been seen at the hospital within the last three years: Yes Total Score: 230 Level Of Care: New/Established - Level 5 Electronic Signature(s) Signed: 09/06/2016 1:17:45 PM By: Gretta Cool, RN, BSN, Kim RN, BSN Entered By: Gretta Cool, RN, BSN, Kim on 09/06/2016 11:52:42 [name] ([id number]) -------------------------------------------------------------------------------- Encounter Discharge Information Details Patient Name: [name]. Date of Service: 09/06/2016 8:45 AM Medical Record Number: [id number] Patient Account Number: [id number] Date of Birth/Sex: 11-24-1930 (82 y.o. Female) Treating RN: Cornell Barman Primary Care Physician: [name] Other  Clinician: Referring Physician: [name] Treating Physician/Extender: Frann Rider in Treatment: 0 Encounter Discharge Information Items Discharge Pain Level: 0 Discharge Condition: Stable Ambulatory Status: Wheelchair Discharge Destination: Home Transportation: Private Auto Accompanied By: sister Schedule Follow-up Appointment: Yes Medication Reconciliation completed and provided to Patient/Care Yes Karandeep Resende: Provided on Clinical Summary of Care: 09/06/2016 Form Type Recipient Paper Patient EB Electronic Signature(s) Signed: 09/06/2016 1:17:45 PM By: Gretta Cool RN, BSN, Kim RN, BSN Previous Signature: 09/06/2016 10:53:43 AM Version By: Ruthine Dose Entered By: Gretta Cool RN, BSN, Kim on 09/06/2016 12:54:01 [name] ([id number]) -------------------------------------------------------------------------------- Lower Extremity Assessment Details Patient Name: [name]. Date of Service: 09/06/2016 8:45 AM Medical Record Number: [id number] Patient Account Number: [id number] Date of Birth/Sex: 05/17/1931 (82 y.o. Female) Treating RN: Cornell Barman Primary Care Physician: [name] Other Clinician: Referring Physician: [name] Treating Physician/Extender: Frann Rider in Treatment: 0 Vascular Assessment Pulses: Posterior Tibial Palpable: [Left:Yes] [Right:Yes] Dorsalis Pedis Palpable: [Left:Yes] [Right:Yes] Extremity colors, hair growth, and conditions: Extremity Color: [Left:Normal] [Right:Normal] Hair Growth on Extremity: [Left:Yes] [Right:Yes] Temperature of Extremity: [Left:Warm] [Right:Warm] Capillary Refill: [Left:< 3 seconds] [Right:< 3 seconds] Dependent Rubor: [Left:No] [Right:No] Blanched when Elevated: [Left:No] [Right:No] Lipodermatosclerosis: [Left:No] [Right:No] Blood Pressure: Brachial: [Left:170] [Right:170] Dorsalis Pedis: 150 [Left:Dorsalis Pedis: 200] Ankle: Posterior Tibial: [Left:Posterior Tibial: 200 0.88]  [Right:1.18] Toe Nail Assessment Left: Right: Thick: Yes Yes Discolored: Yes Yes Deformed: Yes Yes Improper Length and Hygiene: Yes Yes Electronic Signature(s) Signed: 09/06/2016 1:17:45 PM By: Gretta Cool, RN, BSN, Kim RN, BSN Entered By: Gretta Cool, RN, BSN, Kim on 09/06/2016 09:36:36 [name], [name] ([id number]) -------------------------------------------------------------------------------- Multi Wound Chart Details Patient Name: [name]. Date of Service: 09/06/2016 8:45 AM Medical Record Number: [id number] Patient Account Number: [id number] Date of Birth/Sex: 1930-12-25 (82 y.o. Female) Treating RN: Cornell Barman Primary Care Physician: [name] Other Clinician: Referring Physician: [name] Treating Physician/Extender: Frann Rider in Treatment: 0 Vital Signs Height(in): 63 Pulse(bpm): 80 Weight(lbs): 160 Blood Pressure 148/62 (mmHg):  Body Mass Index(BMI): 28 Temperature(F): 98.2 Respiratory Rate 16 (breaths/min): Photos: Wound Location: Right Calcaneus - Medial Left Calcaneus - Medial Right Toe Fifth - Medial Wounding Event: Pressure Injury Pressure Injury Gradually Appeared Primary Etiology: Pressure Ulcer Pressure Ulcer Fungal Date Acquired: 07/02/2016 07/02/2016 08/02/2016 Weeks of Treatment: 0 0 0 Wound Status: Open Open Open Measurements L x W x D 3.5x4.5x0.1 4.5x5.6x0.1 0.5x0.7x0.1 (cm) Area (cm) : 12.37 19.792 0.275 Volume (cm) : 1.237 1.979 0.027 Classification: Unstageable/Unclassified Unstageable/Unclassified Full Thickness Without Exposed Support Structures Exudate Amount: None Present Small Medium Exudate Type: N/A Serous Purulent Exudate Color: N/A amber yellow, brown, green Foul Odor After No Yes Yes Cleansing: Odor Anticipated Due to N/A No No Product Use: Wound Margin: Flat and Intact Flat and Intact Flat and Intact Granulation Amount: None Present (0%) None Present (0%) None Present (0%) Necrotic Amount: Large  (67-100%) Large (67-100%) Large (67-100%) Necrotic Tissue: Eschar Eschar Adherent [name], Danielle S. ([id number]) Epithelialization: None None None Periwound Skin Texture: Edema: No Edema: No Edema: No Excoriation: No Excoriation: No Excoriation: No Induration: No Induration: No Induration: No Callus: No Callus: No Callus: No Crepitus: No Crepitus: No Crepitus: No Fluctuance: No Fluctuance: No Fluctuance: No Friable: No Friable: No Friable: No Rash: No Rash: No Rash: No Scarring: No Scarring: No Scarring: No Periwound Skin Dry/Scaly: Yes Moist: Yes Maceration: Yes Moisture: Maceration: No Maceration: No Moist: No Moist: No Dry/Scaly: No Dry/Scaly: No Periwound Skin Color: Atrophie Blanche: No Atrophie Blanche: No Atrophie Blanche: No Cyanosis: No Cyanosis: No Cyanosis: No Ecchymosis: No Ecchymosis: No Ecchymosis: No Erythema: No Erythema: No Erythema: No Hemosiderin Staining: No Hemosiderin Staining: No Hemosiderin Staining: No Mottled: No Mottled: No Mottled: No Pallor: No Pallor: No Pallor: No Rubor: No Rubor: No Rubor: No Temperature: N/A No Abnormality N/A Tenderness on No Yes No Palpation: Wound Preparation: Ulcer Cleansing: Ulcer Cleansing: Ulcer Cleansing: Rinsed/Irrigated with Rinsed/Irrigated with Rinsed/Irrigated with Saline Saline Saline Topical Anesthetic Topical Anesthetic Topical Anesthetic Applied: None Applied: None Applied: Other: lidocaine 4% Wound Number: [id number]  Photos: Wound Location: Right Toe Fourth - Lateral Toe Fourth - Medial Right Toe Third - Lateral Wounding Event: Gradually Appeared Gradually Appeared Gradually Appeared Primary Etiology: Fungal Fungal Fungal Date Acquired: 08/01/2016 08/02/2016 08/02/2016 Weeks of Treatment: 0 0 0 Wound Status: Open Open Open Measurements L x W x D 0.5x0.9x0.4 0.1x0.1x0.1 0.7x0.8x0.5 (cm) Area (cm) : 0.353 0.008 0.44 Volume (cm) : 0.141 0.001 0.22 Classification:  Partial Thickness [name], BUELNA. ([id number]) Full Thickness Without Full Thickness Without Exposed Support Exposed Support Structures Structures Exudate Amount: Medium None Present Large Exudate Type: Purulent N/A Purulent Exudate Color: yellow, brown, green N/A yellow, brown, green Foul Odor After N/A No Yes Cleansing: Odor Anticipated Due to N/A N/A No Product Use: Wound Margin: Flat and Intact Flat and Intact Flat and Intact Granulation Amount: None Present (0%) Large (67-100%) None Present (0%) Necrotic Amount: Large (67-100%) None Present (0%) Large (67-100%) Necrotic Tissue: Adherent [place] Exposed Structures: Fat: Yes Fascia: No Fat: Yes Fascia: No Fat: No Fascia: No Tendon: No Tendon: No Tendon: No Muscle: No Muscle: No Muscle: No Joint: No Joint: No Joint: No Bone: No Bone: No Bone: No Limited to Skin Breakdown Epithelialization: Large (67-100%) Large (67-100%) None Periwound Skin Texture: Excoriation: Yes Edema: No Edema: No Edema: No Excoriation: No Excoriation: No Induration: No Induration: No Induration: No Callus: No Callus: No Callus: No Crepitus: No Crepitus: No Crepitus: No Fluctuance: No Fluctuance: No Fluctuance: No Friable: No Friable:  No Friable: No Rash: No Rash: No Rash: No Scarring: No Scarring: No Scarring: No Periwound Skin Moist: Yes Maceration: No Maceration: Yes Moisture: Maceration: No Moist: No Moist: No Dry/Scaly: No Dry/Scaly: No Dry/Scaly: No Periwound Skin Color: Atrophie Blanche: No Atrophie Blanche: No Atrophie Blanche: No Cyanosis: No Cyanosis: No Cyanosis: No Ecchymosis: No Ecchymosis: No Ecchymosis: No Erythema: No Erythema: No Erythema: No Hemosiderin Staining: No Hemosiderin Staining: No Hemosiderin Staining: No Mottled: No Mottled: No Mottled: No Pallor: No Pallor: No Pallor: No Rubor: No Rubor: No Rubor: No Temperature: N/A N/A N/A Tenderness on No No  No Palpation: Wound Preparation: Ulcer Cleansing: Ulcer Cleansing: Ulcer Cleansing: Rinsed/Irrigated with Rinsed/Irrigated with Rinsed/Irrigated with Saline Saline Saline [name], Danielle S. ([id number]) Topical Anesthetic Topical Anesthetic Topical Anesthetic Applied: Other: lidocaine Applied: None Applied: Other: lidociane 4% 4% Treatment Notes Electronic Signature(s) Signed: 09/06/2016 1:17:45 PM By: Gretta Cool, RN, BSN, Kim RN, BSN Entered By: Gretta Cool, RN, BSN, Kim on 09/06/2016 10:27:37 [name] ([id number]) -------------------------------------------------------------------------------- [place] Details Patient Name: [name] Date of Service: 09/06/2016 8:45 AM Medical Record Number: [id number] Patient Account Number: [id number] Date of Birth/Sex: May 15, 1931 (82 y.o. Female) Treating RN: Cornell Barman Primary Care Physician: [name] Other Clinician: Referring Physician: [name] Treating Physician/Extender: Frann Rider in Treatment: 0 Active Inactive Abuse / Safety / Falls / Self Care Management Nursing Diagnoses: Impaired physical mobility Potential for falls Goals: Patient will remain injury free Date Initiated: 09/06/2016 Goal Status: Active Interventions: Assess fall risk on admission and as needed Assess self care needs on admission and as needed Notes: Necrotic Tissue Nursing Diagnoses: Impaired tissue integrity related to necrotic/devitalized tissue Goals: Necrotic/devitalized tissue will be minimized in the wound bed Date Initiated: 09/06/2016 Goal Status: Active Interventions: Assess patient pain level pre-, during and post procedure and prior to discharge Notes: Orientation to the Wound Care Program Nursing Diagnoses: Knowledge deficit related to the wound healing center program Goals: SHEMIAH, ROSCH ([id number]) Patient/caregiver will verbalize understanding of the [place] Program Date  Initiated: 09/06/2016 Goal Status: Active Interventions: Provide education on orientation to the wound center Notes: Pressure Nursing Diagnoses: Knowledge deficit related to management of pressures ulcers Potential for impaired tissue integrity related to pressure, friction, moisture, and shear Goals: Patient will remain free of pressure ulcers Date Initiated: 09/06/2016 Goal Status: Active Interventions: Assess: immobility, friction, shearing, incontinence upon admission and as needed Notes: Soft Tissue Infection Nursing Diagnoses: Impaired tissue integrity Potential for infection: soft tissue Goals: Patient will remain free of wound infection Date Initiated: 09/06/2016 Goal Status: Active Interventions: Assess signs and symptoms of infection every visit Notes: Wound/Skin Impairment Nursing Diagnoses: Impaired tissue integrity Goals: Ulcer/skin breakdown will heal within 14 weeks [name], [name] ([id number]) Date Initiated: 09/06/2016 Goal Status: Active Interventions: Assess ulceration(s) every visit Notes: Electronic Signature(s) Signed: 09/06/2016 1:17:45 PM By: Gretta Cool, RN, BSN, Kim RN, BSN Entered By: Gretta Cool, RN, BSN, Kim on 09/06/2016 10:26:46 [name] ([id number]) -------------------------------------------------------------------------------- Pain Assessment Details Patient Name: [name]. Date of Service: 09/06/2016 8:45 AM Medical Record Number: [id number] Patient Account Number: [id number] Date of Birth/Sex: 1931/03/26 (82 y.o. Female) Treating RN: Cornell Barman Primary Care Physician: [name] Other Clinician: Referring Physician: [name] Treating Physician/Extender: Frann Rider in Treatment: 0 Active Problems Location of Pain Severity and Description of Pain Patient Has Paino No Site Locations With Dressing Change: No Pain Management and Medication Current Pain Management: Electronic Signature(s) Signed:  09/06/2016 1:17:45 PM By: Gretta Cool, RN, BSN, Kim RN,  BSN Entered By: Gretta Cool, RN, BSN, Kim on 09/06/2016 32:95:18 [name] ([id number]) -------------------------------------------------------------------------------- Patient/Caregiver Education Details Patient Name: [name] Date of Service: 09/06/2016 8:45 AM Medical Record Number: [id number] Patient Account Number: [id number] Date of Birth/Gender: 1931/07/26 (82 y.o. Female) Treating RN: Cornell Barman Primary Care Physician: [name] Other Clinician: Referring Physician: [name] Treating Physician/Extender: Frann Rider in Treatment: 0 Education Assessment Education Provided To: Patient Education Topics Provided Pressure: Preventing Pressure Ulcers, Other: float heels when in bed, no pressureon heels when sitting Handouts: in wheel chair Methods: Demonstration, Explain/Verbal Responses: State content correctly Welcome To [facility] [place]: Handouts: Welcome To [facility] [place] Methods: Demonstration, Explain/Verbal Wound/Skin Impairment: Handouts: Caring for Your Ulcer Methods: Demonstration Responses: State content correctly Electronic Signature(s) Signed: 09/06/2016 1:17:45 PM By: Gretta Cool, RN, BSN, Kim RN, BSN Entered By: Gretta Cool, RN, BSN, Kim on 09/06/2016 12:55:01 [name] ([id number]) -------------------------------------------------------------------------------- Wound Assessment Details Patient Name: [name]. Date of Service: 09/06/2016 8:45 AM Medical Record Number: [id number] Patient Account Number: [id number] Date of Birth/Sex: 11-11-1930 (82 y.o. Female) Treating RN: Cornell Barman Primary Care Physician: [name] Other Clinician: Referring Physician: [name] Treating Physician/Extender: Frann Rider in Treatment: 0 Wound Status Wound Number: 1 Primary Etiology: Pressure Ulcer Wound Location: Right Calcaneus - Medial Wound Status:  Open Wounding Event: Pressure Injury Date Acquired: 07/02/2016 Weeks Of Treatment: 0 Clustered Wound: No Photos Wound Measurements Length: (cm) 3.5 Width: (cm) 4.5 Depth: (cm) 0.1 Area: (cm) 12.37 Volume: (cm) 1.237 % Reduction in Area: % Reduction in Volume: Epithelialization: None Wound Description Classification: Unstageable/Unclassified Wound Margin: Flat and Intact Exudate Amount: None Present Foul Odor After Cleansing: No Wound Bed Granulation Amount: None Present (0%) Necrotic Amount: Large (67-100%) Necrotic Quality: Eschar Periwound Skin Texture Texture Color No Abnormalities Noted: No No Abnormalities Noted: No Callus: No Atrophie Blanche: No Crepitus: No Cyanosis: No Excoriation: No Ecchymosis: No EVORA, SCHECHTER. ([id number]) Fluctuance: No Erythema: No Friable: No Hemosiderin Staining: No Induration: No Mottled: No Localized Edema: No Pallor: No Rash: No Rubor: No Scarring: No Moisture No Abnormalities Noted: No Dry / Scaly: Yes Maceration: No Moist: No Wound Preparation Ulcer Cleansing: Rinsed/Irrigated with Saline Topical Anesthetic Applied: None Treatment Notes Wound #1 (Right, Medial Calcaneus) 1. Cleansed with: Clean wound with Normal Saline Cleanse wound with antibacterial soap and water 2. Anesthetic Topical Lidocaine 4% cream to wound bed prior to debridement 4. Dressing Applied: Aquacel Ag 5. Secondary Dressing Applied Gauze and Kerlix/Conform 7. Secured with Tape Notes Bilateral heel cups. Electronic Signature(s) Signed: 09/06/2016 1:17:45 PM By: Gretta Cool, RN, BSN, Kim RN, BSN Entered By: Gretta Cool, RN, BSN, Kim on 09/06/2016 09:38:52 [name], [name] ([id number]) -------------------------------------------------------------------------------- Wound Assessment Details Patient Name: [name]. Date of Service: 09/06/2016 8:45 AM Medical Record Number: [id number] Patient Account Number: [id number] Date of Birth/Sex:  11-21-1930 (82 y.o. Female) Treating RN: Cornell Barman Primary Care Physician: [name] Other Clinician: Referring Physician: [name] Treating Physician/Extender: Frann Rider in Treatment: 0 Wound Status Wound Number: 2 Primary Etiology: Pressure Ulcer Wound Location: Left Calcaneus - Medial Wound Status: Open Wounding Event: Pressure Injury Date Acquired: 07/02/2016 Weeks Of Treatment: 0 Clustered Wound: No Photos Wound Measurements Length: (cm) 4.5 Width: (cm) 5.6 Depth: (cm) 0.1 Area: (cm) 19.792 Volume: (cm) 1.979 % Reduction in Area: % Reduction in Volume: Epithelialization: None Wound Description Classification: Unstageable/Unclassified Wound Margin: Flat and Intact Exudate Amount: Small Exudate Type: Serous Exudate Color: amber Foul Odor After Cleansing: Yes Due to  Product Use: No Wound Bed Granulation Amount: None Present (0%) Necrotic Amount: Large (67-100%) Necrotic Quality: Eschar Periwound Skin Texture Texture Color No Abnormalities Noted: No No Abnormalities Noted: No Callus: No Atrophie Blanche: No [name], [name] ([id number]) Crepitus: No Cyanosis: No Excoriation: No Ecchymosis: No Fluctuance: No Erythema: No Friable: No Hemosiderin Staining: No Induration: No Mottled: No Localized Edema: No Pallor: No Rash: No Rubor: No Scarring: No Temperature / Pain Moisture Temperature: No Abnormality No Abnormalities Noted: No Tenderness on Palpation: Yes Dry / Scaly: No Maceration: No Moist: Yes Wound Preparation Ulcer Cleansing: Rinsed/Irrigated with Saline Topical Anesthetic Applied: None Treatment Notes Wound #2 (Left, Medial Calcaneus) 1. Cleansed with: Clean wound with Normal Saline Cleanse wound with antibacterial soap and water 2. Anesthetic Topical Lidocaine 4% cream to wound bed prior to debridement 4. Dressing Applied: Aquacel Ag 5. Secondary Dressing Applied Gauze and Kerlix/Conform 7. Secured  with Tape Notes Bilateral heel cups. Electronic Signature(s) Signed: 09/06/2016 1:17:45 PM By: Gretta Cool, RN, BSN, Kim RN, BSN Entered By: Gretta Cool, RN, BSN, Kim on 09/06/2016 09:40:37 Glad, [name] ([id number]) -------------------------------------------------------------------------------- Wound Assessment Details Patient Name: [name]. Date of Service: 09/06/2016 8:45 AM Medical Record Number: [id number] Patient Account Number: [id number] Date of Birth/Sex: 02/28/1931 (82 y.o. Female) Treating RN: Cornell Barman Primary Care Physician: [name] Other Clinician: Referring Physician: [name] Treating Physician/Extender: Frann Rider in Treatment: 0 Wound Status Wound Number: 3 Primary Etiology: Fungal Wound Location: Right Toe Fifth - Medial Wound Status: Open Wounding Event: Gradually Appeared Date Acquired: 08/02/2016 Weeks Of Treatment: 0 Clustered Wound: No Photos Wound Measurements Length: (cm) 0.5 Width: (cm) 0.7 Depth: (cm) 0.1 Area: (cm) 0.275 Volume: (cm) 0.027 % Reduction in Area: % Reduction in Volume: Epithelialization: None Tunneling: No Undermining: No Wound Description Full Thickness Without Exposed Classification: Support Structures Wound Margin: Flat and Intact Exudate Medium Amount: Exudate Type: Purulent Exudate Color: yellow, brown, green Foul Odor After Cleansing: Yes Due to Product Use: No Wound Bed Granulation Amount: None Present (0%) Exposed Structure Necrotic Amount: Large (67-100%) Fascia Exposed: No Necrotic Quality: Adherent Slough Fat Layer Exposed: Yes Tendon Exposed: No Muscle Exposed: No Joint Exposed: No [name], Danielle S. ([id number]) Bone Exposed: No Periwound Skin Texture Texture Color No Abnormalities Noted: No No Abnormalities Noted: No Callus: No Atrophie Blanche: No Crepitus: No Cyanosis: No Excoriation: No Ecchymosis: No Fluctuance: No Erythema: No Friable: No Hemosiderin  Staining: No Induration: No Mottled: No Localized Edema: No Pallor: No Rash: No Rubor: No Scarring: No Moisture No Abnormalities Noted: No Dry / Scaly: No Maceration: Yes Moist: No Wound Preparation Ulcer Cleansing: Rinsed/Irrigated with Saline Topical Anesthetic Applied: Other: lidocaine 4%, Treatment Notes Wound #3 (Right, Medial Toe Fifth) 1. Cleansed with: Clean wound with Normal Saline Cleanse wound with antibacterial soap and water 2. Anesthetic Topical Lidocaine 4% cream to wound bed prior to debridement 4. Dressing Applied: Aquacel Ag 5. Secondary Dressing Applied Gauze and Kerlix/Conform 7. Secured with Tape Notes Bilateral heel cups. Electronic Signature(s) Signed: 09/06/2016 1:17:45 PM By: Gretta Cool, RN, BSN, Kim RN, BSN Entered By: Gretta Cool, RN, BSN, Kim on 09/06/2016 09:42:54 [name] ([id number]) -------------------------------------------------------------------------------- Wound Assessment Details Patient Name: [name]. Date of Service: 09/06/2016 8:45 AM Medical Record Number: [id number] Patient Account Number: [id number] Date of Birth/Sex: 04/25/1931 (82 y.o. 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 - Lateral Wound Status: Open Wounding Event: Gradually Appeared Date Acquired: 08/01/2016 Weeks Of Treatment: 0 Clustered Wound: No Photos Wound Measurements Length: (cm) 0.5 Width: (cm) 0.9 Depth: (cm) 0.4 Area: (cm) 0.353 Volume: (cm) 0.141 % Reduction in Area: % Reduction in Volume: Epithelialization: Large (67-100%) Tunneling: No Undermining: No Wound Description Full Thickness Without Exposed Classification: Support Structures Wound Margin: Flat and  Intact Exudate Medium Amount: Exudate Type: Purulent Exudate Color: yellow, brown, green Wound Bed Granulation Amount: None Present (0%) Exposed Structure Necrotic Amount: Large (67-100%) Fascia Exposed: No Necrotic Quality: Adherent Slough Fat Layer Exposed: Yes Tendon Exposed: No Muscle Exposed: No Joint Exposed: No Hellard, Kyrin S. ([id number]) Bone Exposed: No Periwound Skin Texture Texture Color No Abnormalities Noted: No No Abnormalities Noted: No Callus: No Atrophie Blanche: No Crepitus: No Cyanosis: No Excoriation: Yes Ecchymosis: No Fluctuance: No Erythema: No Friable: No Hemosiderin Staining: No Induration: No Mottled: No Localized Edema: No Pallor: No Rash: No Rubor: No Scarring: No Moisture No Abnormalities Noted: No Dry / Scaly: No Maceration: No Moist: Yes Wound Preparation Ulcer Cleansing: Rinsed/Irrigated with Saline Topical Anesthetic Applied: Other: lidocaine 4%, Treatment Notes Wound #4 (Right, Lateral Toe Fourth) 1. Cleansed with: Clean wound with Normal Saline Cleanse wound with antibacterial soap and water 2. Anesthetic Topical Lidocaine 4% cream to wound bed prior to debridement 4. Dressing Applied: Aquacel Ag 5. Secondary Dressing Applied Gauze and Kerlix/Conform 7. Secured with Tape Notes Bilateral heel cups. Electronic Signature(s) Signed: 09/06/2016 1:17:45 PM By: Gretta Cool, RN, BSN, Kim RN, BSN Entered By: Gretta Cool, RN, BSN, Kim on 09/06/2016 09:44:52 Iorio, [name] ([id number]) -------------------------------------------------------------------------------- Wound Assessment Details Patient Name: [name]. Date of Service: 09/06/2016 8:45 AM Medical Record Number: [id number] Patient Account Number: [id number] Date of Birth/Sex: 06/21/1931 (82 y.o. Female) Treating RN: Cornell Barman Primary Care Physician: [name] Other Clinician: Referring Physician: [name] Treating Physician/Extender: Frann Rider in Treatment: 0 Wound Status Wound Number: 5 Primary Etiology: Fungal Wound Location: Toe Fourth - Medial Wound Status: Open Wounding Event: Gradually Appeared Date Acquired: 08/02/2016 Weeks Of Treatment: 0 Clustered Wound: No Photos Wound Measurements Length: (cm) 0.1 Width: (cm) 0.1 Depth: (cm) 0.1 Area: (cm) 0.008 Volume: (cm) 0.001 % Reduction in Area: % Reduction in Volume: Epithelialization: Large (67-100%) Tunneling: No Undermining: No Wound Description Classification: Partial Thickness Wound Margin: Flat and Intact Exudate Amount: None Present Foul Odor After Cleansing: No Wound Bed Granulation Amount: Large (67-100%) Exposed Structure Necrotic Amount: None Present (0%) Fascia Exposed: No Fat Layer Exposed: No Tendon Exposed: No Muscle Exposed: No Joint Exposed: No Bone Exposed: No Limited to Skin Breakdown Periwound Skin Texture Achor, Jamilla S. ([id number]) Texture Color No Abnormalities Noted: No No Abnormalities Noted: No Callus: No Atrophie Blanche: No Crepitus: No Cyanosis: No Excoriation: No Ecchymosis: No Fluctuance: No Erythema: No Friable: No Hemosiderin Staining: No Induration: No Mottled: No Localized Edema: No Pallor: No Rash: No Rubor: No Scarring: No Moisture No Abnormalities Noted: No Dry / Scaly: No Maceration: No Moist: No Wound Preparation Ulcer Cleansing: Rinsed/Irrigated with Saline Topical Anesthetic Applied: None Treatment Notes Wound #5 (Medial Toe Fourth) 1. Cleansed with: Clean wound with Normal Saline Cleanse wound with antibacterial soap and water 2. Anesthetic Topical Lidocaine 4% cream to wound bed prior to debridement 4. Dressing Applied: Aquacel Ag 5. Secondary Dressing Applied Gauze and Kerlix/Conform 7. Secured with Tape Notes Bilateral heel cups. Electronic Signature(s) Signed: 09/06/2016 1:17:45 PM By: Gretta Cool, RN, BSN, Kim RN, BSN Entered By: Gretta Cool, RN, BSN,  Kim on 09/06/2016  09:46:22 MATISHA, TERMINE ([id number]) -------------------------------------------------------------------------------- Wound Assessment Details Patient Name: THEODORE, RAHRIG. Date of Service: 09/06/2016 8:45 AM Medical Record Number: [id number] Patient Account Number: [id number] Date of Birth/Sex: Feb 09, 1931 (82 y.o. Female) Treating RN: Cornell Barman Primary Care Physician: [name] Other Clinician: Referring Physician: [name] Treating Physician/Extender: Frann Rider in Treatment: 0 Wound Status Wound Number: 6 Primary Etiology: Fungal Wound Location: Right Toe Third - Lateral Wound Status: Open Wounding Event: Gradually Appeared Date Acquired: 08/02/2016 Weeks Of Treatment: 0 Clustered Wound: No Photos Wound Measurements Length: (cm) 0.7 Width: (cm) 0.8 Depth: (cm) 0.5 Area: (cm) 0.44 Volume: (cm) 0.22 % Reduction in Area: % Reduction in Volume: Epithelialization: None Tunneling: No Undermining: No Wound Description Full Thickness Without Exposed Classification: Support Structures Wound Margin: Flat and Intact Exudate Large Amount: Exudate Type: Purulent Exudate Color: yellow, brown, green Foul Odor After Cleansing: Yes Due to Product Use: No Wound Bed Granulation Amount: None Present (0%) Exposed Structure Necrotic Amount: Large (67-100%) Fascia Exposed: No Necrotic Quality: Adherent Slough Fat Layer Exposed: Yes Tendon Exposed: No Muscle Exposed: No Joint Exposed: No Peacock, Leda S. ([id number]) Bone Exposed: No Periwound Skin Texture Texture Color No Abnormalities Noted: No No Abnormalities Noted: No Callus: No Atrophie Blanche: No Crepitus: No Cyanosis: No Excoriation: No Ecchymosis: No Fluctuance: No Erythema: No Friable: No Hemosiderin Staining: No Induration: No Mottled: No Localized Edema: No Pallor: No Rash: No Rubor: No Scarring: No Moisture No Abnormalities Noted: No Dry / Scaly: No Maceration:  Yes Moist: No Wound Preparation Ulcer Cleansing: Rinsed/Irrigated with Saline Topical Anesthetic Applied: Other: lidociane 4%, Treatment Notes Wound #6 (Right, Lateral Toe Third) 1. Cleansed with: Clean wound with Normal Saline Cleanse wound with antibacterial soap and water 2. Anesthetic Topical Lidocaine 4% cream to wound bed prior to debridement 4. Dressing Applied: Aquacel Ag 5. Secondary Dressing Applied Gauze and Kerlix/Conform 7. Secured with Tape Notes Bilateral heel cups. Electronic Signature(s) Signed: 09/06/2016 1:17:45 PM By: Gretta Cool, RN, BSN, Kim RN, BSN Entered By: Gretta Cool, RN, BSN, Kim on 09/06/2016 09:48:57 [name] ([id number]) -------------------------------------------------------------------------------- [facility]le Details Patient Name: [name] Date of Service: 09/06/2016 8:45 AM Medical Record Number: [id number] Patient Account Number: [id number] Date of Birth/Sex: 1930/11/22 (82 y.o. Female) Treating RN: Cornell Barman Primary Care Physician: [name] Other Clinician: Referring Physician: [name] Treating Physician/Extender: Frann Rider in Treatment: 0 Vital Signs Time Taken: 09:09 Temperature (F): 98.2 Height (in): 63 Pulse (bpm): 80 Source: Stated Respiratory Rate (breaths/min): 16 Weight (lbs): 160 Blood Pressure (mmHg): 148/62 Source: Stated Reference Range: 80 - 120 mg / dl Body Mass Index (BMI): 28.3 Electronic Signature(s) Signed: 09/06/2016 1:17:45 PM By: Gretta Cool, RN, BSN, Kim RN, BSN Entered By: Gretta Cool, RN, BSN, Kim on 09/06/2016 09:36:00

## 2016-09-13 ENCOUNTER — Encounter: Payer: Medicare HMO | Admitting: Surgery

## 2016-09-13 DIAGNOSIS — L89620 Pressure ulcer of left heel, unstageable: Secondary | ICD-10-CM | POA: Diagnosis not present

## 2016-09-13 NOTE — Progress Notes (Addendum)
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 serosanguineous drainage noted. The wound margin is flat and intact. There is large (67-100%) granulation within the wound bed. There is a small (1-33%) amount of necrotic tissue within the wound bed including Adherent Slough. The periwound skin appearance exhibited: Excoriation, Moist. The periwound skin appearance did not exhibit: Callus, Crepitus, Fluctuance, Friable, Induration, Localized Edema, Rash, Scarring, Dry/Scaly, Maceration, Atrophie Blanche, Cyanosis, Ecchymosis, Hemosiderin  Staining, Mottled, Pallor, Rubor,  Erythema. Wound #5 status is Open. Original cause of wound was Gradually Appeared. The wound is located on the Right,Medial Toe Fourth. The wound measures 0cm length x 0cm width x 0cm depth; 0cm^2 area and 0cm^3 volume. The wound is limited to skin breakdown. There is a none present amount of drainage noted. The wound margin is flat and intact. There is large (67-100%) granulation within the wound bed. There is no necrotic tissue within the wound bed. The periwound skin appearance did not exhibit: Callus, Crepitus, Excoriation, Fluctuance, Friable, Induration, Localized Edema, Rash, Scarring, Dry/Scaly, Maceration, Moist, Atrophie Blanche, Cyanosis, Ecchymosis, Hemosiderin Staining, Mottled, Pallor, Rubor, Erythema. Wound #6 status is Open. Original cause of wound was Gradually Appeared. The wound is located on the Right,Lateral Toe Third. The wound measures 0.4cm length x 0.7cm width x 0.2cm depth; 0.22cm^2 area and 0.044cm^3 volume. There is fat exposed. There is no tunneling or undermining noted. There is a medium amount of serosanguineous drainage noted. The wound margin is flat and intact. There is no granulation within the wound bed. There is a large (67-100%) amount of necrotic tissue within the wound bed including Adherent Slough. The periwound skin appearance exhibited: Dry/Scaly. The periwound skin appearance did not exhibit: Callus, Crepitus, Excoriation, Fluctuance, Friable, Induration, Localized Edema, Rash, Scarring, Maceration, Moist, Atrophie Blanche, Cyanosis, Ecchymosis, Hemosiderin Staining, Mottled, Pallor, Rubor, Erythema. Assessment Active Problems ICD-10 L89.620 - Pressure ulcer of left heel, unstageable L89.610 - Pressure ulcer of right heel, unstageable L97.512 - Non-pressure chronic ulcer of other part of right foot with fat layer exposed Traber, Shakala S. 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 There was a Skin/Subcutaneous Tissue Debridement ([id number]) debridement with total area of 16 sq cm performed by Christin Fudge, MD. with the following instrument(s): Zuniga, Forceps, and Scissors to remove Non-Viable tissue/material including Fat, Fibrin/Slough, Eschar, and Subcutaneous after achieving pain control using Other (lidocaine 4%). A time out was conducted at 14:40, prior to the start of the procedure. A Minimum amount of bleeding was controlled with Pressure. The procedure was tolerated well with a pain level of 0 throughout and a pain level of 0 following the procedure. Post Debridement Measurements: 3.2cm length x 5cm width x 0.6cm depth; 7.54cm^3 volume. Post debridement Stage noted as Unstageable/Unclassified. Character of Wound/Ulcer Post Debridement requires further debridement. Severity of Tissue Post Debridement is: Fat layer exposed. Post procedure Diagnosis Wound #1: Same as Pre-Procedure Wound #2 Wound #2 is a Pressure Ulcer located on the Left,Medial Calcaneus . There was a Skin/Subcutaneous Tissue Debridement ([id number]) debridement with total area of 21.28 sq cm performed by Christin Fudge, MD. with the following instrument(s): Zuniga, Forceps, and Scissors to remove Non-Viable tissue/material including Fat, Fibrin/Slough, Eschar, and Subcutaneous after achieving pain control using Other (lidocaine 4%). A time out was conducted at 14:40, prior to the start of the procedure. A Minimum amount of bleeding was controlled with Pressure. The procedure was tolerated well with a pain level of 0 throughout and a pain level of 0 following the procedure. Post Debridement Measurements: 3.8cm length x 5.6cm width x 0.1cm depth; 1.671cm^3 volume. Post debridement Stage noted as Unstageable/Unclassified. Character of Wound/Ulcer Post Debridement requires further debridement. Severity of Tissue Post Debridement is: Fat layer exposed. Post procedure Diagnosis  Wound #2: Same as Pre-Procedure Plan Wound Cleansing: Wound #1 Right,Medial Calcaneus: Clean wound with Normal Saline. Wound #2 Left,Medial Calcaneus: Clean wound with Normal  Saline. Wound #4 Right,Lateral Toe Fourth: Clean wound with Normal Saline. CLAIRA, JETER ([id number]) Wound #6 Right,Lateral Toe Third: Clean wound with Normal Saline. Anesthetic: Wound #1 Right,Medial Calcaneus: Topical Lidocaine 4% cream applied to wound bed prior to debridement Wound #2 Left,Medial Calcaneus: Topical Lidocaine 4% cream applied to wound bed prior to debridement Wound #4 Right,Lateral Toe Fourth: Topical Lidocaine 4% cream applied to wound bed prior to debridement Wound #6 Right,Lateral Toe Third: Topical Lidocaine 4% cream applied to wound bed prior to debridement Primary Wound Dressing: Wound #1 Right,Medial Calcaneus: Santyl Ointment Wound #2 Left,Medial Calcaneus: Santyl Ointment Wound #4 Right,Lateral Toe Fourth: Aquacel Ag Wound #6 Right,Lateral Toe Third: Aquacel Ag Secondary Dressing: Wound #1 Right,Medial Calcaneus: Conform/Kerlix Other - heel cups Wound #2 Left,Medial Calcaneus: Conform/Kerlix Other - heel cups Wound #4 Right,Lateral Toe Fourth: Conform/Kerlix Wound #6 Right,Lateral Toe Third: Conform/Kerlix Dressing Change Frequency: Wound #1 Right,Medial Calcaneus: Change dressing every day. Wound #2 Left,Medial Calcaneus: Change dressing every day. Wound #4 Right,Lateral Toe Fourth: Change dressing every other day. Wound #6 Right,Lateral Toe Third: Change dressing every other day. Follow-up Appointments: Wound #1 Right,Medial Calcaneus: Return Appointment in 1 week. Wound #2 Left,Medial Calcaneus: Return Appointment in 1 week. Wound #4 Right,Lateral Toe Fourth: Return Appointment in 1 week. Wound #6 Right,Lateral Toe Third: Return Appointment in 1 week. Off-Loading: Wound #1 Right,Medial Calcaneus: DONYA, HITCH. ([id number]) Other: - float heels when  in bed; keep pressure off during the day Wound #2 Left,Medial Calcaneus: Other: - float heels when in bed; keep pressure off during the day Home Health: Wound #1 Right,Medial Calcaneus: Continue Home Health Visits - [place] Nurse may visit PRN to address patient s wound care needs. FACE TO FACE ENCOUNTER: MEDICARE and MEDICAID PATIENTS: I certify that this patient is under my care and that I had a face-to-face encounter that meets the physician face-to-face encounter requirements with this patient on this date. The encounter with the patient was in whole or in part for the following MEDICAL CONDITION: (primary reason for [place]) MEDICAL NECESSITY: I certify, that based on my findings, NURSING services are a medically necessary home health service. HOME BOUND STATUS: I certify that my clinical findings support that this patient is homebound (i.e., Due to illness or injury, pt requires aid of supportive devices such as crutches, cane, wheelchairs, walkers, the use of special transportation or the assistance of another person to leave their place of residence. There is a normal inability to leave the home and doing so requires considerable and taxing effort. Other absences are for medical reasons / religious services and are infrequent or of short duration when for other reasons). If current dressing causes regression in wound condition, may D/C ordered dressing product/s and apply Normal Saline Moist Dressing daily until next [place] / Other MD appointment. [place] of regression in wound condition at [phone]. Please direct any NON-WOUND related issues/requests for orders to patient's Primary Care Physician Wound #2 Left,Medial Calcaneus: [place] Visits - [place] Nurse may visit PRN to address patient s wound care needs. FACE TO FACE ENCOUNTER: MEDICARE and MEDICAID PATIENTS: I certify that this patient is  under my care and that I had a face-to-face encounter that meets the physician face-to-face encounter requirements with this patient on this date. The encounter with the patient was in whole or in part for the following MEDICAL CONDITION: (primary reason for [place]) MEDICAL NECESSITY: I certify, that based on my findings,  NURSING services are a medically necessary home health service. HOME BOUND STATUS: I certify that my clinical findings support that this patient is homebound (i.e., Due to illness or injury, pt requires aid of supportive devices such as crutches, cane, wheelchairs, walkers, the use of special transportation or the assistance of another person to leave their place of residence. There is a normal inability to leave the home and doing so requires considerable and taxing effort. Other absences are for medical reasons / religious services and are infrequent or of short duration when for other reasons). If current dressing causes regression in wound condition, may D/C ordered dressing product/s and apply Normal Saline Moist Dressing daily until next [place] / Other MD appointment. [place] of regression in wound condition at [phone]. Please direct any NON-WOUND related issues/requests for orders to patient's Primary Care Physician Wound #4 Right,Lateral Toe Fourth: [place] Visits - [place] Nurse may visit PRN to address patient s wound care needs. FACE TO FACE ENCOUNTER: MEDICARE and MEDICAID PATIENTS: I certify that this patient is under my care and that I had a face-to-face encounter that meets the physician face-to-face encounter requirements with this patient on this date. The encounter with the patient was in whole or in part for the following MEDICAL CONDITION: (primary reason for [place]) MEDICAL NECESSITY: I certify, that based on my findings, NURSING services are a medically necessary home health  service. HOME BOUND STATUS: I certify that my clinical findings support that this patient is homebound (i.e., Due to illness or injury, pt requires aid of supportive devices such as crutches, cane, wheelchairs, walkers, the use of special transportation or the assistance of another person to leave their place of residence. There is a normal inability to leave the home and doing so requires considerable and taxing effort. Other absences are for medical reasons / religious services and are infrequent or of short duration when for other reasons). AVENELL, SELLERS ([id number]) If current dressing causes regression in wound condition, may D/C ordered dressing product/s and apply Normal Saline Moist Dressing daily until next [place] / Other MD appointment. [place] of regression in wound condition at [phone]. Please direct any NON-WOUND related issues/requests for orders to patient's Primary Care Physician Wound #6 Right,Lateral Toe Third: [place] Visits - [place] Nurse may visit PRN to address patient s wound care needs. FACE TO FACE ENCOUNTER: MEDICARE and MEDICAID PATIENTS: I certify that this patient is under my care and that I had a face-to-face encounter that meets the physician face-to-face encounter requirements with this patient on this date. The encounter with the patient was in whole or in part for the following MEDICAL CONDITION: (primary reason for [place]) MEDICAL NECESSITY: I certify, that based on my findings, NURSING services are a medically necessary home health service. HOME BOUND STATUS: I certify that my clinical findings support that this patient is homebound (i.e., Due to illness or injury, pt requires aid of supportive devices such as crutches, cane, wheelchairs, walkers, the use of special transportation or the assistance of another person to leave their place of residence. There is a normal inability to  leave the home and doing so requires considerable and taxing effort. Other absences are for medical reasons / religious services and are infrequent or of short duration when for other reasons). If current dressing causes regression in wound condition, may D/C ordered dressing product/s and apply Normal Saline Moist  Dressing daily until next [place] / Other MD appointment. [place] of regression in wound condition at [phone]. Please direct any NON-WOUND related issues/requests for orders to patient's Primary Care Physician Radiology ordered were: X-ray, foot - Bilateral heels I have recommended: 1. Santyl ointment to both heels, heel cups and offloading with Sage boots. 2. Silver alginate to be placed between her toes on the right foot. 3. x-rays of both feet -- she will get it done this week 4. Adequate proteins, vitamin A, vitamin C and zinc. Electronic Signature(s) Signed: 09/14/2016 10:12:40 AM By: Gretta Cool, RN, BSN, Kim RN, BSN Signed: 09/14/2016 10:22:59 AM By: Christin Fudge MD, FACS Previous Signature: 09/13/2016 3:12:11 PM Version By: Christin Fudge MD, FACS Previous Signature: 09/13/2016 2:38:53 PM Version By: Christin Fudge MD, FACS Entered By: Gretta Cool RN, BSN, Kim on 09/14/2016 10:12:39 [name] ([id number]) -------------------------------------------------------------------------------- SuperBill Details Patient Name: VIVICA, DOBOSZ. Date of Service: 09/13/2016 Medical Record Number: [id number] Patient Account Number: [id number] Date of Birth/Sex: [birth year] (82 y.o. Female) Treating RN: Cornell Barman Primary Care Physician: [name] Other Clinician: Referring Physician: [name] Treating Physician/Extender: Frann Rider in Treatment: 1 Diagnosis Coding ICD-10 Codes Code Description [phone] Pressure ulcer of left heel, unstageable L89.610 Pressure ulcer of right heel, unstageable L97.512 Non-pressure chronic ulcer  of other part of right foot with fat layer exposed Z99.3 Dependence on wheelchair Facility Procedures CPT4 Code Description: [id number] 11042 - DEB SUBQ TISSUE 20 SQ CM/< ICD-10 Description Diagnosis L89.620 Pressure ulcer of left heel, unstageable L89.610 Pressure ulcer of right heel, unstageable 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 Z99.3 Dependence on wheelchair Modifier: ot with fat la Quantity: 1 yer exposed CPT4 Code Description: [id number] 11045 - DEB SUBQ TISS EA ADDL 20CM ICD-10 Description Diagnosis L89.620 Pressure ulcer of left heel, unstageable L89.610 Pressure ulcer of right heel, unstageable 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 Z99.3 Dependence on wheelchair Modifier: ot with fat la Quantity: 1 yer exposed Physician Procedures CPT4 Code Description: [id number] 11042 - WC PHYS SUBQ TISS 20 SQ CM ICD-10 Description Diagnosis L89.620 Pressure ulcer of left heel, unstageable L89.610 Pressure ulcer of right heel, unstageable 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o Z99.3 Dependence on wheelchair KAMARIE, VENO ([id number]) Modifier: t with fat lay Quantity: 1 er exposed Electronic Signature(s) Signed: 09/13/2016 3:12:31 PM By: Christin Fudge MD, FACS Entered By: Christin Fudge on 09/13/2016 15:12:31

## 2016-09-14 NOTE — Progress Notes (Addendum)
SARAHI, BORLAND ([id number]) Visit Report for 09/13/2016 Arrival Information Details Patient Name: [name], [name]. Date of Service: 09/13/2016 1:45 PM Medical Record Number: [id number] Patient Account Number: [id number] Date of Birth/Sex: [birth year] (82 y.o. Female) Treating RN: Cornell Barman Primary Care Physician: [name] Other Clinician: Referring Physician: [name] Treating Physician/Extender: Frann Rider in Treatment: 1 Visit Information History Since Last Visit Added or deleted any medications: No Patient Arrived: Wheel Chair Any new allergies or adverse reactions: No Arrival Time: 13:58 Had a fall or experienced change in No activities of daily living that may affect Accompanied By: sister risk of falls: Transfer Assistance: [employer] Signs or symptoms of abuse/neglect since last No Patient Identification Verified: Yes visito Secondary Verification Process Yes Hospitalized since last visit: No Completed: Has Dressing in Place as Prescribed: Yes Patient Requires Transmission-Based No Pain Present Now: No Precautions: Patient Has Alerts: No Electronic Signature(s) Signed: 09/13/2016 5:17:06 PM By: Gretta Cool, RN, BSN, Kim RN, BSN Entered By: Gretta Cool, RN, BSN, Kim on 09/13/2016 13:58:46 [name], [name] ([id number]) -------------------------------------------------------------------------------- Encounter Discharge Information Details Patient Name: [name]. Date of Service: 09/13/2016 1:45 PM Medical Record Number: [id number] Patient Account Number: [id number] Date of Birth/Sex: 1931/09/15 (82 y.o. Female) Treating RN: Cornell Barman Primary Care Physician: [name] Other Clinician: Referring Physician: [name] Treating Physician/Extender: Frann Rider in Treatment: 1 Encounter Discharge Information Items Discharge Pain Level: 0 Discharge Condition: Stable Ambulatory Status: Wheelchair Discharge Destination:  Home Transportation: Private Auto Accompanied By: sister Schedule Follow-up Appointment: Yes Medication Reconciliation completed and provided to Patient/Care Yes [name]: Provided on Clinical Summary of Care: 09/13/2016 Form Type Recipient Paper Patient EB Electronic Signature(s) Signed: 09/13/2016 3:13:05 PM By: Ruthine Dose Entered By: Ruthine Dose on 09/13/2016 15:13:05 [name] ([id number]) -------------------------------------------------------------------------------- Lower Extremity Assessment Details Patient Name: [name]. Date of Service: 09/13/2016 1:45 PM Medical Record Number: [id number] Patient Account Number: [id number] Date of Birth/Sex: [birth year] (82 y.o. Female) Treating RN: Cornell Barman Primary Care Physician: [name] Other Clinician: Referring Physician: [name] Treating Physician/Extender: Frann Rider in Treatment: 1 Vascular Assessment Pulses: Posterior Tibial Dorsalis Pedis Palpable: [Left:Yes] [Right:Yes] Extremity colors, hair growth, and conditions: Capillary Refill: [Left:< 3 seconds] [Right:< 3 seconds] Electronic Signature(s) Signed: 09/13/2016 5:17:06 PM By: Gretta Cool, RN, BSN, Kim RN, BSN Entered By: Gretta Cool, RN, BSN, Kim on 09/13/2016 14:02:48 [name] ([id number]) -------------------------------------------------------------------------------- Multi Wound Chart Details Patient Name: [name]. Date of Service: 09/13/2016 1:45 PM Medical Record Number: [id number] Patient Account Number: [id number] Date of Birth/Sex: 21-Sep-1931 (82 y.o. Female) Treating RN: Cornell Barman Primary Care Physician: [name] Other Clinician: Referring Physician: [name] Treating Physician/Extender: Frann Rider in Treatment: 1 Vital Signs Height(in): 63 Pulse(bpm): 67 Weight(lbs): 160 Blood Pressure 139/61 (mmHg): Body Mass Index(BMI): 28 Temperature(F): 98.7 Respiratory  Rate 16 (breaths/min): Photos: [1:No Photos] [2:No Photos] [3:No Photos] Wound Location: [1:Right Calcaneus - Medial] [2:Left Calcaneus - Medial] [3:Right Toe Fifth - Medial] Wounding Event: [1:Pressure Injury] [2:Pressure Injury] [3:Gradually Appeared] Primary Etiology: [1:Pressure Ulcer] [2:Pressure Ulcer] [3:Fungal] Comorbid History: [1:Cataracts, Hypertension] [2:Cataracts, Hypertension] [3:Cataracts, Hypertension] Date Acquired: [1:07/02/2016] [2:07/02/2016] [3:08/02/2016] Weeks of Treatment: [1:1] [2:1] [3:1] Wound Status: [1:Open] [2:Open] [3:Open] Measurements L x W x D 3.2x5x0.1 [2:3.8x5.6x0.1] [3:0.1x0.1x0.1] (cm) Area (cm) : [1:12.566] [2:16.713] [3:0.008] Volume (cm) : [1:1.257] [2:1.671] [3:0.001] % Reduction in Area: [1:-1.60%] [2:15.60%] [3:97.10%] % Reduction in Volume: -1.60% [2:15.60%] [3:96.30%] Classification: [1:Unstageable/Unclassified] [2:Unstageable/Unclassified] [3:Full Thickness Without Exposed Support Structures] Exudate Amount: [1:Medium] [2:Small] [3:None Present] Exudate Type: [1:Serous] [  2:Serosanguineous] [3:N/A] Exudate Color: [1:amber] [2:red, brown] [3:N/A] Foul Odor After [1:Yes] [2:Yes] [3:No] Cleansing: Odor Anticipated Due to [1:No] [2:No] [3:N/A] Product Use: Wound Margin: [1:Flat and Intact] [2:Flat and Intact] [3:Flat and Intact] Granulation Amount: [1:None Present (0%)] [2:None Present (0%)] [3:Large (67-100%)] Necrotic Amount: [1:Large (67-100%)] [2:Large (67-100%)] [3:None Present (0%)] Necrotic Tissue: [1:Eschar] [2:Eschar] [3:N/A] Epithelialization: [1:None] [2:None] [3:Large (67-100%)] Periwound Skin Texture: Edema: No Edema: No Edema: No Excoriation: No Excoriation: No Excoriation: No Induration: No Induration: No Induration: No Callus: No Callus: No Callus: No Crepitus: No Crepitus: No Crepitus: No Fluctuance: No Fluctuance: No Fluctuance: No Friable: No Friable: No Friable: No Rash: No Rash: No Rash: No Scarring:  No Scarring: No Scarring: No Periwound Skin Maceration: Yes Maceration: Yes Maceration: Yes Moisture: Moist: No Moist: No Moist: No Dry/Scaly: No Dry/Scaly: No Dry/Scaly: No Periwound Skin Color: Atrophie Blanche: No Atrophie Blanche: No Atrophie Blanche: No Cyanosis: No Cyanosis: No Cyanosis: No Ecchymosis: No Ecchymosis: No Ecchymosis: No Erythema: No Erythema: No Erythema: No Hemosiderin Staining: No Hemosiderin Staining: No Hemosiderin Staining: No Mottled: No Mottled: No Mottled: No Pallor: No Pallor: No Pallor: No Rubor: No Rubor: No Rubor: No Temperature: N/A No Abnormality N/A Tenderness on No Yes No Palpation: Wound Preparation: Ulcer Cleansing: Ulcer Cleansing: Ulcer Cleansing: Rinsed/Irrigated with Rinsed/Irrigated with Rinsed/Irrigated with Saline Saline Saline Topical Anesthetic Topical Anesthetic Applied: None, Other: Applied: None, Other: lidocaine 4% Lidocaine 4% Wound Number: [id number]  Photos: No Photos No Photos No Photos Wound Location: Right Toe Fourth - Lateral Right Toe Fourth - Medial Right Toe Third - Lateral Wounding Event: Gradually Appeared Gradually Appeared Gradually Appeared Primary Etiology: Fungal Fungal Fungal Comorbid History: Cataracts, Hypertension Cataracts, Hypertension Cataracts, Hypertension Date Acquired: 08/01/2016 08/02/2016 08/02/2016 Weeks of Treatment: 1 1 1  Wound Status: Open Open Open Measurements L x W x D 0.5x0.4x0.1 0x0x0 0.4x0.7x0.2 (cm) Area (cm) : 0.157 0 0.22 Volume (cm) : 0.016 0 0.044 % Reduction in Area: 55.50% 100.00% 50.00% % Reduction in Volume: 88.70% 100.00% 80.00% Classification: Full Thickness Without Partial Thickness Full Thickness Without Exposed Support Exposed Support Structures Structures Exudate Amount: Small None Present Medium [name], [name] ([id number]) Exudate Type: Serosanguineous N/A Serosanguineous Exudate Color: red, brown N/A red, brown Foul Odor After No No  Yes Cleansing: Odor Anticipated Due to N/A N/A No Product Use: Wound Margin: Flat and Intact Flat and Intact Flat and Intact Granulation Amount: Large (67-100%) Large (67-100%) None Present (0%) Necrotic Amount: Small (1-33%) None Present (0%) Large (67-100%) Necrotic Tissue: Adherent Tolar Exposed Structures: Fat: Yes Fascia: No Fat: Yes Fascia: No Fat: No Fascia: No Tendon: No Tendon: No Tendon: No Muscle: No Muscle: No Muscle: No Joint: No Joint: No Joint: No Bone: No Bone: No Bone: No Limited to Skin Breakdown Epithelialization: None Large (67-100%) None Periwound Skin Texture: Excoriation: Yes Edema: No Edema: No Edema: No Excoriation: No Excoriation: No Induration: No Induration: No Induration: No Callus: No Callus: No Callus: No Crepitus: No Crepitus: No Crepitus: No Fluctuance: No Fluctuance: No Fluctuance: No Friable: No Friable: No Friable: No Rash: No Rash: No Rash: No Scarring: No Scarring: No Scarring: No Periwound Skin Moist: Yes Maceration: No Dry/Scaly: Yes Moisture: Maceration: No Moist: No Maceration: No Dry/Scaly: No Dry/Scaly: No Moist: No Periwound Skin Color: Atrophie Blanche: No Atrophie Blanche: No Atrophie Blanche: No Cyanosis: No Cyanosis: No Cyanosis: No Ecchymosis: No Ecchymosis: No Ecchymosis: No Erythema: No Erythema: No Erythema: No Hemosiderin Staining: No Hemosiderin Staining: No Hemosiderin Staining: No Mottled: No  Mottled: No Mottled: No Pallor: No Pallor: No Pallor: No Rubor: No Rubor: No Rubor: No Temperature: N/A N/A N/A Tenderness on No No No Palpation: Wound Preparation: Ulcer Cleansing: Ulcer Cleansing: Ulcer Cleansing: Rinsed/Irrigated with Rinsed/Irrigated with Rinsed/Irrigated with Saline Saline Saline Topical Anesthetic Topical Anesthetic Topical Anesthetic Applied: Other: lidocaine Applied: None Applied: Other: lidociane 4% 4% [name], [name]  ([id number]) Treatment Notes Electronic Signature(s) Signed: 09/13/2016 5:17:06 PM By: Gretta Cool, RN, BSN, Kim RN, BSN Entered By: Gretta Cool, RN, BSN, Kim on 09/13/2016 14:18:42 [name] ([id number]) -------------------------------------------------------------------------------- [place] Details Patient Name: [name] Date of Service: 09/13/2016 1:45 PM Medical Record Number: [id number] Patient Account Number: [id number] Date of Birth/Sex: October 24, 1930 (82 y.o. Female) Treating RN: Cornell Barman Primary Care Physician: [name] Other Clinician: Referring Physician: [name] Treating Physician/Extender: Frann Rider in Treatment: 1 Active Inactive Abuse / Safety / Falls / Self Care Management Nursing Diagnoses: Impaired physical mobility Potential for falls Goals: Patient will remain injury free Date Initiated: 09/06/2016 Goal Status: Active Interventions: Assess fall risk on admission and as needed Assess self care needs on admission and as needed Notes: Necrotic Tissue Nursing Diagnoses: Impaired tissue integrity related to necrotic/devitalized tissue Goals: Necrotic/devitalized tissue will be minimized in the wound bed Date Initiated: 09/06/2016 Goal Status: Active Interventions: Assess patient pain level pre-, during and post procedure and prior to discharge Notes: Orientation to the Wound Care Program Nursing Diagnoses: Knowledge deficit related to the wound healing center program Goals: [name], [name] ([id number]) Patient/caregiver will verbalize understanding of the [place] Program Date Initiated: 09/06/2016 Goal Status: Active Interventions: Provide education on orientation to the wound center Notes: Pressure Nursing Diagnoses: Knowledge deficit related to management of pressures ulcers Potential for impaired tissue integrity related to pressure, friction, moisture, and shear Goals: Patient will  remain free of pressure ulcers Date Initiated: 09/06/2016 Goal Status: Active Interventions: Assess: immobility, friction, shearing, incontinence upon admission and as needed Notes: Soft Tissue Infection Nursing Diagnoses: Impaired tissue integrity Potential for infection: soft tissue Goals: Patient will remain free of wound infection Date Initiated: 09/06/2016 Goal Status: Active Interventions: Assess signs and symptoms of infection every visit Notes: Wound/Skin Impairment Nursing Diagnoses: Impaired tissue integrity Goals: Ulcer/skin breakdown will heal within 14 weeks [name], [name] ([id number]) Date Initiated: 09/06/2016 Goal Status: Active Interventions: Assess ulceration(s) every visit Notes: Electronic Signature(s) Signed: 09/13/2016 5:17:06 PM By: Gretta Cool, RN, BSN, Kim RN, BSN Entered By: Gretta Cool, RN, BSN, Kim on 09/13/2016 14:18:24 [name] ([id number]) -------------------------------------------------------------------------------- Pain Assessment Details Patient Name: [name]. Date of Service: 09/13/2016 1:45 PM Medical Record Number: [id number] Patient Account Number: [id number] Date of Birth/Sex: January 22, 1931 (82 y.o. Female) Treating RN: Cornell Barman Primary Care Physician: [name] Other Clinician: Referring Physician: [name] Treating Physician/Extender: Frann Rider in Treatment: 1 Active Problems Location of Pain Severity and Description of Pain Patient Has Paino No Site Locations With Dressing Change: No Pain Management and Medication Current Pain Management: Electronic Signature(s) Signed: 09/13/2016 5:17:06 PM By: Gretta Cool, RN, BSN, Kim RN, BSN Entered By: Gretta Cool, RN, BSN, Kim on 09/13/2016 13:58:54 [name] ([id number]) -------------------------------------------------------------------------------- Patient/Caregiver Education Details Patient Name: [name] Date of Service: 09/13/2016 1:45 PM Medical  Record Number: [id number] Patient Account Number: [id number] Date of Birth/Gender: Mar 09, 1931 (82 y.o. Female) Treating RN: Cornell Barman Primary Care Physician: [name] Other Clinician: Referring Physician: [name] Treating Physician/Extender: Frann Rider in Treatment: 1 Education Assessment Education Provided To: Patient Education Topics Provided Pressure:  Handouts: Pressure Ulcers: Care and Offloading, Other: elevate heels Methods: Demonstration Responses: State content correctly Wound/Skin Impairment: Handouts: Caring for Your Ulcer, Other: daily wound care as prescribed Methods: Demonstration, Explain/Verbal Responses: State content correctly Electronic Signature(s) Signed: 09/13/2016 5:17:06 PM By: Gretta Cool, RN, BSN, Kim RN, BSN Entered By: Gretta Cool, RN, BSN, Kim on 09/13/2016 15:11:57 [name] ([id number]) -------------------------------------------------------------------------------- Wound Assessment Details Patient Name: [name]. Date of Service: 09/13/2016 1:45 PM Medical Record Number: [id number] Patient Account Number: [id number] Date of Birth/Sex: 1931/02/23 (82 y.o. Female) Treating RN: Cornell Barman Primary Care Physician: [name] Other Clinician: Referring Physician: [name] Treating Physician/Extender: Frann Rider in Treatment: 1 Wound Status Wound Number: 1 Primary Etiology: Pressure Ulcer Wound Location: Right Calcaneus - Medial Wound Status: Open Wounding Event: Pressure Injury Comorbid History: Cataracts, Hypertension Date Acquired: 07/02/2016 Weeks Of Treatment: 1 Clustered Wound: No Photos Wound Measurements Length: (cm) 3.2 Width: (cm) 5 Depth: (cm) 0.1 Area: (cm) 12.566 Volume: (cm) 1.257 % Reduction in Area: -1.6% % Reduction in Volume: -1.6% Epithelialization: None Tunneling: No Undermining: No Wound Description Classification: Category/Stage III Wound Margin: Flat and  Intact Exudate Amount: Medium Exudate Type: Serous Exudate Color: amber Foul Odor After Cleansing: Yes Due to Product Use: No Wound Bed Granulation Amount: None Present (0%) Necrotic Amount: Large (67-100%) Necrotic Quality: Eschar Periwound Skin Texture Texture Color No Abnormalities Noted: No No Abnormalities Noted: No Callus: No Atrophie Blanche: No [name], [name] ([id number]) Crepitus: No Cyanosis: No Excoriation: No Ecchymosis: No Fluctuance: No Erythema: No Friable: No Hemosiderin Staining: No Induration: No Mottled: No Localized Edema: No Pallor: No Rash: No Rubor: No Scarring: No Moisture No Abnormalities Noted: No Dry / Scaly: No Maceration: Yes Moist: No Wound Preparation Ulcer Cleansing: Rinsed/Irrigated with Saline Topical Anesthetic Applied: None, Other: lidocaine 4%, Treatment Notes Wound #1 (Right, Medial Calcaneus) 1. Cleansed with: Clean wound with Normal Saline 2. Anesthetic Topical Lidocaine 4% cream to wound bed prior to debridement 4. Dressing Applied: Santyl Ointment 5. Secondary Dressing Applied Kerlix/Conform 7. Secured with Tape Notes heel cops; Best boy) Signed: 09/14/2016 1:34:55 PM By: Gretta Cool, RN, BSN, Kim RN, BSN Previous Signature: 09/13/2016 5:17:06 PM Version By: Gretta Cool, RN, BSN, Kim RN, BSN Entered By: Gretta Cool, RN, BSN, Kim on 09/14/2016 09:21:00 [name] ([id number]) -------------------------------------------------------------------------------- Wound Assessment Details Patient Name: [name]. Date of Service: 09/13/2016 1:45 PM Medical Record Number: [id number] Patient Account Number: [id number] Date of Birth/Sex: 1931-06-13 (82 y.o. Female) Treating RN: Cornell Barman Primary Care Physician: [name] Other Clinician: Referring Physician: [name] Treating Physician/Extender: Frann Rider in Treatment: 1 Wound Status Wound Number: 2 Primary Etiology:  Pressure Ulcer Wound Location: Left Calcaneus - Medial Wound Status: Open Wounding Event: Pressure Injury Comorbid History: Cataracts, Hypertension Date Acquired: 07/02/2016 Weeks Of Treatment: 1 Clustered Wound: No Photos Wound Measurements Length: (cm) 3.8 Width: (cm) 5.6 Depth: (cm) 0.1 Area: (cm) 16.713 Volume: (cm) 1.671 % Reduction in Area: 15.6% % Reduction in Volume: 15.6% Epithelialization: None Tunneling: No Undermining: No Wound Description Classification: Category/Stage III Wound Margin: Flat and Intact Exudate Amount: Small Exudate Type: Serosanguineous Exudate Color: red, brown Foul Odor After Cleansing: Yes Due to Product Use: No Wound Bed Granulation Amount: None Present (0%) Necrotic Amount: Large (67-100%) Necrotic Quality: Eschar Periwound Skin Texture Texture Color No Abnormalities Noted: No No Abnormalities Noted: No Callus: No Atrophie Blanche: No ANALYAH, MCCONNON. ([id number]) Crepitus: No Cyanosis: No Excoriation: No Ecchymosis: No Fluctuance: No Erythema: No Friable: No Hemosiderin Staining:  No Induration: No Mottled: No Localized Edema: No Pallor: No Rash: No Rubor: No Scarring: No Temperature / Pain Moisture Temperature: No Abnormality No Abnormalities Noted: No Tenderness on Palpation: Yes Dry / Scaly: No Maceration: Yes Moist: No Wound Preparation Ulcer Cleansing: Rinsed/Irrigated with Saline Topical Anesthetic Applied: None, Other: Lidocaine 4%, Treatment Notes Wound #2 (Left, Medial Calcaneus) 1. Cleansed with: Clean wound with Normal Saline 2. Anesthetic Topical Lidocaine 4% cream to wound bed prior to debridement 4. Dressing Applied: Santyl Ointment 5. Secondary Dressing Applied Kerlix/Conform 7. Secured with Tape Notes heel cops; Best boy) Signed: 09/14/2016 1:34:55 PM By: Gretta Cool, RN, BSN, Kim RN, BSN Previous Signature: 09/13/2016 5:17:06 PM Version By: Gretta Cool, RN, BSN, Kim RN,  BSN Entered By: Gretta Cool, RN, BSN, Kim on 09/14/2016 09:21:26 [name] ([id number]) -------------------------------------------------------------------------------- Wound Assessment Details Patient Name: [name]. Date of Service: 09/13/2016 1:45 PM Medical Record Number: [id number] Patient Account Number: [id number] Date of Birth/Sex: [birth year] (82 y.o. Female) Treating RN: Cornell Barman Primary Care Physician: [name] Other Clinician: Referring Physician: [name] Treating Physician/Extender: Frann Rider in Treatment: 1 Wound Status Wound Number: 3 Primary Etiology: Fungal Wound Location: Right Toe Fifth - Medial Wound Status: Healed - Epithelialized Wounding Event: Gradually Appeared Comorbid History: Cataracts, Hypertension Date Acquired: 08/02/2016 Weeks Of Treatment: 1 Clustered Wound: No Photos Photo Uploaded By: Gretta Cool, RN, BSN, Kim on 09/13/2016 17:10:36 Wound Measurements Length: (cm) 0 % Reduction i Width: (cm) 0 % Reduction i Depth: (cm) 0 Epithelializa Area: (cm) 0 Tunneling: Volume: (cm) 0 Undermining: n Area: 100% n Volume: 100% tion: Large (67-100%) No No Wound Description Full Thickness Without Exposed Foul Odor Aft Classification: Support Structures Wound Margin: Flat and Intact Exudate None Present Amount: er Cleansing: No Wound Bed Granulation Amount: Large (67-100%) Exposed Structure Necrotic Amount: None Present (0%) Fascia Exposed: No Fat Layer Exposed: Yes Tendon Exposed: No Muscle Exposed: No Joint Exposed: No Bone Exposed: No [name], Danielle S. ([id number]) Periwound Skin Texture Texture Color No Abnormalities Noted: No No Abnormalities Noted: No Callus: No Atrophie Blanche: No Crepitus: No Cyanosis: No Excoriation: No Ecchymosis: No Fluctuance: No Erythema: No Friable: No Hemosiderin Staining: No Induration: No Mottled: No Localized Edema: No Pallor: No Rash: No Rubor: No Scarring:  No Moisture No Abnormalities Noted: No Dry / Scaly: No Maceration: No Moist: No Wound Preparation Ulcer Cleansing: Rinsed/Irrigated with Saline Topical Anesthetic Applied: None Electronic Signature(s) Signed: 09/13/2016 5:17:06 PM By: Gretta Cool, RN, BSN, Kim RN, BSN Entered By: Gretta Cool, RN, BSN, Kim on 09/13/2016 15:03:56 [name] ([id number]) -------------------------------------------------------------------------------- Wound Assessment Details Patient Name: [name]. Date of Service: 09/13/2016 1:45 PM Medical Record Number: [id number] Patient Account Number: [id number] Date of Birth/Sex: [birth year] (82 y.o. Female) Treating RN: Cornell Barman Primary Care Physician: [name] Other Clinician: Referring Physician: [name] Treating Physician/Extender: Frann Rider in Treatment: 1 Wound Status Wound Number: 4 Primary Etiology: Fungal Wound Location: Right Toe Fourth - Lateral Wound Status: Open Wounding Event: Gradually Appeared Comorbid History: Cataracts, Hypertension Date Acquired: 08/01/2016 Weeks Of Treatment: 1 Clustered Wound: No Photos Photo Uploaded By: Gretta Cool, RN, BSN, Kim on 09/13/2016 17:10:36 Wound Measurements Length: (cm) 0.5 Width: (cm) 0.4 Depth: (cm) 0.1 Area: (cm) 0.157 Volume: (cm) 0.016 % Reduction in Area: 55.5% % Reduction in Volume: 88.7% Epithelialization: None Tunneling: No Undermining: No Wound Description Full Thickness Without Exposed Classification: Support Structures Wound Margin: Flat and Intact Exudate Small Amount: Exudate Type: Serosanguineous Exudate Color: red, brown Foul Odor After Cleansing:  No Wound Bed Granulation Amount: Large (67-100%) Exposed Structure Necrotic Amount: Small (1-33%) Fascia Exposed: No Necrotic Quality: Adherent Slough Fat Layer Exposed: Yes Tendon Exposed: No Muscle Exposed: No [name], Danielle S. ([id number]) Joint Exposed: No Bone Exposed: No Periwound Skin  Texture Texture Color No Abnormalities Noted: No No Abnormalities Noted: No Callus: No Atrophie Blanche: No Crepitus: No Cyanosis: No Excoriation: Yes Ecchymosis: No Fluctuance: No Erythema: No Friable: No Hemosiderin Staining: No Induration: No Mottled: No Localized Edema: No Pallor: No Rash: No Rubor: No Scarring: No Moisture No Abnormalities Noted: No Dry / Scaly: No Maceration: No Moist: Yes Wound Preparation Ulcer Cleansing: Rinsed/Irrigated with Saline Topical Anesthetic Applied: Other: lidocaine 4%, Electronic Signature(s) Signed: 09/13/2016 5:17:06 PM By: Gretta Cool, RN, BSN, Kim RN, BSN Entered By: Gretta Cool, RN, BSN, Kim on 09/13/2016 14:15:27 [name] ([id number]) -------------------------------------------------------------------------------- Wound Assessment Details Patient Name: [name]. Date of Service: 09/13/2016 1:45 PM Medical Record Number: [id number] Patient Account Number: [id number] Date of Birth/Sex: March 21, 1931 (82 y.o. Female) Treating RN: Cornell Barman Primary Care Physician: [name] Other Clinician: Referring Physician: [name] Treating Physician/Extender: Frann Rider in Treatment: 1 Wound Status Wound Number: 5 Primary Etiology: Fungal Wound Location: Right Toe Fourth - Medial Wound Status: Open Wounding Event: Gradually Appeared Comorbid History: Cataracts, Hypertension Date Acquired: 08/02/2016 Weeks Of Treatment: 1 Clustered Wound: No Photos Photo Uploaded By: Gretta Cool, RN, BSN, Kim on 09/13/2016 17:11:10 Wound Measurements Length: (cm) 0 % Reduction i Width: (cm) 0 % Reduction i Depth: (cm) 0 Epithelializa Area: (cm) 0 Volume: (cm) 0 n Area: 100% n Volume: 100% tion: Large (67-100%) Wound Description Classification: Partial Thickness Foul Odor Aft Wound Margin: Flat and Intact Exudate Amount: None Present er Cleansing: No Wound Bed Granulation Amount: Large (67-100%) Exposed  Structure Necrotic Amount: None Present (0%) Fascia Exposed: No Fat Layer Exposed: No Tendon Exposed: No Muscle Exposed: No Joint Exposed: No Bone Exposed: No Limited to Skin Breakdown [name], [name] ([id number]) Periwound Skin Texture Texture Color No Abnormalities Noted: No No Abnormalities Noted: No Callus: No Atrophie Blanche: No Crepitus: No Cyanosis: No Excoriation: No Ecchymosis: No Fluctuance: No Erythema: No Friable: No Hemosiderin Staining: No Induration: No Mottled: No Localized Edema: No Pallor: No Rash: No Rubor: No Scarring: No Moisture No Abnormalities Noted: No Dry / Scaly: No Maceration: No Moist: No Wound Preparation Ulcer Cleansing: Rinsed/Irrigated with Saline Topical Anesthetic Applied: None Electronic Signature(s) Signed: 09/13/2016 5:17:06 PM By: Gretta Cool, RN, BSN, Kim RN, BSN Entered By: Gretta Cool, RN, BSN, Kim on 09/13/2016 14:15:55 [name] ([id number]) -------------------------------------------------------------------------------- Wound Assessment Details Patient Name: [name]. Date of Service: 09/13/2016 1:45 PM Medical Record Number: [id number] Patient Account Number: [id number] Date of Birth/Sex: [birth year] (82 y.o. Female) Treating RN: Cornell Barman Primary Care Physician: [name] Other Clinician: Referring Physician: [name] Treating Physician/Extender: Frann Rider in Treatment: 1 Wound Status Wound Number: 6 Primary Etiology: Fungal Wound Location: Right Toe Third - Lateral Wound Status: Open Wounding Event: Gradually Appeared Comorbid History: Cataracts, Hypertension Date Acquired: 08/02/2016 Weeks Of Treatment: 1 Clustered Wound: No Photos Photo Uploaded By: Gretta Cool, RN, BSN, Kim on 09/13/2016 17:11:10 Wound Measurements Length: (cm) 0.4 Width: (cm) 0.7 Depth: (cm) 0.2 Area: (cm) 0.22 Volume: (cm) 0.044 % Reduction in Area: 50% % Reduction in Volume: 80% Epithelialization:  None Tunneling: No Undermining: No Wound Description Full Thickness Without Exposed Classification: Support Structures Wound Margin: Flat and Intact Exudate Medium Amount: Exudate Type: Serosanguineous Exudate Color: red, brown Foul Odor After Cleansing: Yes  Due to Product Use: No Wound Bed Granulation Amount: None Present (0%) Exposed Structure Necrotic Amount: Large (67-100%) Fascia Exposed: No Necrotic Quality: Adherent Slough Fat Layer Exposed: Yes Tendon Exposed: No Muscle Exposed: No [name], [name] S. ([id number]) Joint Exposed: No Bone Exposed: No Periwound Skin Texture Texture Color No Abnormalities Noted: No No Abnormalities Noted: No Callus: No Atrophie Blanche: No Crepitus: No Cyanosis: No Excoriation: No Ecchymosis: No Fluctuance: No Erythema: No Friable: No Hemosiderin Staining: No Induration: No Mottled: No Localized Edema: No Pallor: No Rash: No Rubor: No Scarring: No Moisture No Abnormalities Noted: No Dry / Scaly: Yes Maceration: No Moist: No Wound Preparation Ulcer Cleansing: Rinsed/Irrigated with Saline Topical Anesthetic Applied: Other: lidociane 4%, Treatment Notes Wound #6 (Right, Lateral Toe Third) 1. Cleansed with: Clean wound with Normal Saline 2. Anesthetic Topical Lidocaine 4% cream to wound bed prior to debridement 4. Dressing Applied: Aquacel Ag 5. Secondary Dressing Applied Kerlix/Conform 7. Secured with Recruitment consultant) Signed: 09/13/2016 5:17:06 PM By: Gretta Cool, RN, BSN, Kim RN, BSN Entered By: Gretta Cool, RN, BSN, Kim on 09/13/2016 14:15:39 [name] ([id number]) -------------------------------------------------------------------------------- [place] Details Patient Name: [name] Date of Service: 09/13/2016 1:45 PM Medical Record Number: [id number] Patient Account Number: [id number] Date of Birth/Sex: [birth year] (82 y.o. Female) Treating RN: Cornell Barman Primary Care Physician: [name] Other Clinician: Referring Physician: [name] Treating Physician/Extender: Frann Rider in Treatment: 1 Vital Signs Time Taken: 13:58 Temperature (F): 98.7 Height (in): 63 Pulse (bpm): 67 Weight (lbs): 160 Respiratory Rate (breaths/min): 16 Body Mass Index (BMI): 28.3 Blood Pressure (mmHg): 139/61 Reference Range: 80 - 120 mg / dl Electronic Signature(s) Signed: 09/13/2016 5:17:06 PM By: Gretta Cool, RN, BSN, Kim RN, BSN Entered By: Gretta Cool, RN, BSN, Kim on 09/13/2016 14:00:29

## 2016-09-17 ENCOUNTER — Ambulatory Visit
Admission: RE | Admit: 2016-09-17 | Discharge: 2016-09-17 | Disposition: A | Discharge status: Home / Self Care | Payer: Medicare HMO | Source: Ambulatory Visit | Attending: Surgery | Admitting: Surgery

## 2016-09-17 ENCOUNTER — Ambulatory Visit
Admission: RE | Admit: 2016-09-17 | Discharge: 2016-09-17 | Disposition: A | Discharge status: Home / Self Care | Payer: Medicare HMO | Source: Ambulatory Visit | Attending: Urology | Admitting: Urology

## 2016-09-17 ENCOUNTER — Other Ambulatory Visit: Payer: Self-pay | Admitting: Surgery

## 2016-09-17 DIAGNOSIS — R339 Retention of urine, unspecified: Secondary | ICD-10-CM | POA: Insufficient documentation

## 2016-09-17 DIAGNOSIS — M868X7 Other osteomyelitis, ankle and foot: Secondary | ICD-10-CM

## 2016-09-17 DIAGNOSIS — Z466 Encounter for fitting and adjustment of urinary device: Secondary | ICD-10-CM | POA: Insufficient documentation

## 2016-09-17 DIAGNOSIS — M7989 Other specified soft tissue disorders: Secondary | ICD-10-CM | POA: Diagnosis not present

## 2016-09-17 DIAGNOSIS — L97529 Non-pressure chronic ulcer of other part of left foot with unspecified severity: Secondary | ICD-10-CM | POA: Diagnosis not present

## 2016-09-17 DIAGNOSIS — L97519 Non-pressure chronic ulcer of other part of right foot with unspecified severity: Secondary | ICD-10-CM | POA: Insufficient documentation

## 2016-09-17 HISTORY — PX: IR GENERIC HISTORICAL: IMG1180011

## 2016-09-17 MED ORDER — LIDOCAINE HCL (PF) 1 % IJ SOLN
INTRAMUSCULAR | Status: DC | PRN
  Administered 2016-09-17: 8 mL

## 2016-09-17 MED ORDER — SODIUM CHLORIDE FLUSH 0.9 % IV SOLN
INTRAVENOUS | Status: AC
  Filled 2016-09-17: qty 80

## 2016-09-17 MED ORDER — IOPAMIDOL (ISOVUE-300) INJECTION 61%
10.0000 mL | Freq: Once | INTRAVENOUS | Status: AC | PRN
  Administered 2016-09-17: 10 mL

## 2016-09-17 NOTE — Sedation Documentation (Signed)
Unable to start IV after multiple tries-no sedation

## 2016-09-17 NOTE — Procedures (Signed)
12 Fr to 14 suprapubic catheter upsizing No comp/EBL

## 2016-09-20 ENCOUNTER — Other Ambulatory Visit: Payer: Self-pay | Admitting: Urology

## 2016-09-20 ENCOUNTER — Ambulatory Visit: Payer: Medicare HMO | Admitting: Surgery

## 2016-09-20 DIAGNOSIS — R339 Retention of urine, unspecified: Secondary | ICD-10-CM

## 2016-09-21 NOTE — Telephone Encounter (Signed)
Notified pt's sister, Mariane Duval, that appt for SPT exchange has been scheduled for 10/29/16 with arrival time of 7:30 to [facility] Registration desk. Advised that pt should be npo after mn on 10/28/16 except she should take amlodipine, atenolol & losartan with a sip of water. Also notified [name] that appt with Dr Erlene Quan has been rescheduled from 10/22/16 to 12/03/16 @1 :76 at the [facility] office. [name] voices understanding.

## 2016-09-23 ENCOUNTER — Encounter: Payer: Medicare HMO | Attending: Surgery | Admitting: Surgery

## 2016-09-23 DIAGNOSIS — D649 Anemia, unspecified: Secondary | ICD-10-CM | POA: Diagnosis not present

## 2016-09-23 DIAGNOSIS — L89620 Pressure ulcer of left heel, unstageable: Secondary | ICD-10-CM | POA: Insufficient documentation

## 2016-09-23 DIAGNOSIS — N189 Chronic kidney disease, unspecified: Secondary | ICD-10-CM | POA: Diagnosis not present

## 2016-09-23 DIAGNOSIS — L97512 Non-pressure chronic ulcer of other part of right foot with fat layer exposed: Secondary | ICD-10-CM | POA: Insufficient documentation

## 2016-09-23 DIAGNOSIS — Z993 Dependence on wheelchair: Secondary | ICD-10-CM | POA: Insufficient documentation

## 2016-09-23 DIAGNOSIS — I129 Hypertensive chronic kidney disease with stage 1 through stage 4 chronic kidney disease, or unspecified chronic kidney disease: Secondary | ICD-10-CM | POA: Insufficient documentation

## 2016-09-23 DIAGNOSIS — L89610 Pressure ulcer of right heel, unstageable: Secondary | ICD-10-CM | POA: Diagnosis not present

## 2016-09-24 NOTE — Progress Notes (Signed)
ELYANAH, FARINO ([id number]) Visit Report for 09/23/2016 Arrival Information Details Patient Name: [name], [name]. Date of Service: 09/23/2016 2:30 PM Medical Record Number: [id number] Patient Account Number: [id number] Date of Birth/Sex: 09-Aug-1931 (82 y.o. Female) Treating RN: Afful, RN, BSN, Velva Harman Primary Care Physician: [name] Other Clinician: Referring Physician: [name] Treating Physician/Extender: Frann Rider in Treatment: 2 Visit Information History Since Last Visit All ordered tests and consults were completed: No Patient Arrived: Wheel Chair Added or deleted any medications: No Arrival Time: 14:38 Any new allergies or adverse reactions: No Accompanied By: self Had a fall or experienced change in No activities of daily living that may affect Transfer Assistance: Harrel Lemon Lift risk of falls: Patient Identification Verified: Yes Signs or symptoms of abuse/neglect since last No Secondary Verification Process Yes visito Completed: Hospitalized since last visit: No Patient Requires Transmission-Based No Has Dressing in Place as Prescribed: Yes Precautions: Pain Present Now: No Patient Has Alerts: No Electronic Signature(s) Signed: 09/23/2016 5:09:39 PM By: Regan Lemming BSN, RN Entered By: Regan Lemming on 09/23/2016 14:45:13 [name] ([id number]) -------------------------------------------------------------------------------- Encounter Discharge Information Details Patient Name: [name]. Date of Service: 09/23/2016 2:30 PM Medical Record Number: [id number] Patient Account Number: [id number] Date of Birth/Sex: Jan 27, 1931 (82 y.o. Female) Treating RN: Baruch Gouty, RN, BSN, Velva Harman Primary Care Physician: [name] Other Clinician: Referring Physician: [name] Treating Physician/Extender: Frann Rider in Treatment: 2 Encounter Discharge Information Items Schedule Follow-up Appointment: No Medication Reconciliation  completed No and provided to Patient/Care Harumi Yamin: Provided on Clinical Summary of Care: 09/23/2016 Form Type Recipient Paper Patient EB Electronic Signature(s) Signed: 09/23/2016 3:43:22 PM By: Ruthine Dose Entered By: Ruthine Dose on 09/23/2016 15:43:22 [place], [name] ([id number]) -------------------------------------------------------------------------------- Lower Extremity Assessment Details Patient Name: [name]. Date of Service: 09/23/2016 2:30 PM Medical Record Number: [id number] Patient Account Number: [id number] Date of Birth/Sex: Feb 20, 1931 (82 y.o. Female) Treating RN: Afful, RN, BSN, Velva Harman Primary Care Physician: [name] Other Clinician: Referring Physician: [name] Treating Physician/Extender: Frann Rider in Treatment: 2 Edema Assessment Assessed: [Left: No] [Right: No] Edema: [Left: Yes] [Right: Yes] Vascular Assessment Pulses: Posterior Tibial Palpable: [Left:No] [Right:No] Dorsalis Pedis Palpable: [Left:Yes] [Right:Yes] Extremity colors, hair growth, and conditions: Extremity Color: [Left:Dusky] [Right:Dusky] Hair Growth on Extremity: [Left:No] [Right:No] Temperature of Extremity: [Left:Warm] [Right:Warm] Capillary Refill: [Left:< 3 seconds] [Right:< 3 seconds] Toe Nail Assessment Left: Right: Thick: Yes Yes Discolored: Yes Yes Deformed: No No Improper Length and Hygiene: Yes Yes Electronic Signature(s) Signed: 09/23/2016 5:09:39 PM By: Regan Lemming BSN, RN Entered By: Regan Lemming on 09/23/2016 14:52:27 [name] ([id number]) -------------------------------------------------------------------------------- Multi Wound Chart Details Patient Name: [name]. Date of Service: 09/23/2016 2:30 PM Medical Record Number: [id number] Patient Account Number: [id number] Date of Birth/Sex: 1931/08/05 (82 y.o. Female) Treating RN: Baruch Gouty, RN, BSN, Velva Harman Primary Care Physician: [name] Other Clinician: Referring  Physician: [name] Treating Physician/Extender: Frann Rider in Treatment: 2 Vital Signs Height(in): 63 Pulse(bpm): 69 Weight(lbs): 160 Blood Pressure 168/69 (mmHg): Body Mass Index(BMI): 28 Temperature(F): 98.7 Respiratory Rate 16 (breaths/min): Photos: [1:No Photos] [2:No Photos] [4:No Photos] Wound Location: [1:Right Calcaneus - Medial] [2:Left Calcaneus - Medial] [4:Right Toe Fourth - Lateral] Wounding Event: [1:Pressure Injury] [2:Pressure Injury] [4:Gradually Appeared] Primary Etiology: [1:Pressure Ulcer] [2:Pressure Ulcer] [4:Fungal] Secondary Etiology: [1:N/A] [2:N/A] [4:Pressure Ulcer] Comorbid History: [1:Cataracts, Hypertension] [2:Cataracts, Hypertension] [4:Cataracts, Hypertension] Date Acquired: [1:07/02/2016] [2:07/02/2016] [4:08/01/2016] Weeks of Treatment: [1:2] [2:2] [4:2] Wound Status: [1:Open] [2:Open] [4:Healed - Epithelialized] Measurements L x W x D 3.8x5x0.1 [  2:3.5x4.5x0.1] [4:0x0x0] (cm) Area (cm) : [1:14.923] [2:12.37] [4:0] Volume (cm) : [1:1.492] [2:1.237] [4:0] % Reduction in Area: [1:-20.60%] [2:37.50%] [4:100.00%] % Reduction in Volume: -20.60% [2:37.50%] [4:100.00%] Classification: [1:Category/Stage III] [2:Category/Stage III] [4:Full Thickness Without Exposed Support Structures] Exudate Amount: [1:Medium] [2:Small] [4:None Present] Exudate Type: [1:Serous] [2:Serosanguineous] [4:N/A] Exudate Color: [1:amber] [2:red, brown] [4:N/A] Foul Odor After [1:Yes] [2:Yes] [4:No] Cleansing: Odor Anticipated Due to [1:No] [2:No] [4:N/A] Product Use: Wound Margin: [1:Flat and Intact] [2:Flat and Intact] [4:Flat and Intact] Granulation Amount: [1:None Present (0%)] [2:None Present (0%)] [4:None Present (0%)] Necrotic Amount: [1:Large (67-100%)] [2:Large (67-100%)] [4:None Present (0%)] Necrotic Tissue: [1:Eschar, Adherent Slough] [2:Eschar] [4:N/A] Epithelialization: None None Large (67-100%) Periwound Skin Texture: Edema: No Edema: No  Edema: No Excoriation: No Excoriation: No Excoriation: No Induration: No Induration: No Induration: No Callus: No Callus: No Callus: No Crepitus: No Crepitus: No Crepitus: No Fluctuance: No Fluctuance: No Fluctuance: No Friable: No Friable: No Friable: No Rash: No Rash: No Rash: No Scarring: No Scarring: No Scarring: No Periwound Skin Moist: Yes Moist: Yes Maceration: No Moisture: Maceration: No Dry/Scaly: Yes Moist: No Dry/Scaly: No Maceration: No Dry/Scaly: No Periwound Skin Color: Atrophie Blanche: No Atrophie Blanche: No Atrophie Blanche: No Cyanosis: No Cyanosis: No Cyanosis: No Ecchymosis: No Ecchymosis: No Ecchymosis: No Erythema: No Erythema: No Erythema: No Hemosiderin Staining: No Hemosiderin Staining: No Hemosiderin Staining: No Mottled: No Mottled: No Mottled: No Pallor: No Pallor: No Pallor: No Rubor: No Rubor: No Rubor: No Temperature: N/A No Abnormality N/A Tenderness on No Yes No Palpation: Wound Preparation: Ulcer Cleansing: Ulcer Cleansing: Ulcer Cleansing: Rinsed/Irrigated with Rinsed/Irrigated with Rinsed/Irrigated with Saline Saline Saline Topical Anesthetic Topical Anesthetic Topical Anesthetic Applied: Other: lidocaine Applied: Other: Lidocaine Applied: None 4% 4% Wound Number: 6 N/A N/A Photos: No Photos N/A N/A Wound Location: Right Toe Third - Lateral N/A N/A Wounding Event: Gradually Appeared N/A N/A Primary Etiology: Fungal N/A N/A Secondary Etiology: N/A N/A N/A Comorbid History: Cataracts, Hypertension N/A N/A Date Acquired: 08/02/2016 N/A N/A Weeks of Treatment: 2 N/A N/A Wound Status: Open N/A N/A Measurements L x W x D 0.4x0.4x0.2 N/A N/A (cm) Area (cm) : 0.126 N/A N/A Volume (cm) : 0.025 N/A N/A % Reduction in Area: 71.40% N/A N/A % Reduction in Volume: 88.60% N/A N/A Classification: N/A N/A [name], [name] ([id number]) Full Thickness Without Exposed Support Structures Exudate Amount: Medium  N/A N/A Exudate Type: Serosanguineous N/A N/A Exudate Color: red, brown N/A N/A Foul Odor After Yes N/A N/A Cleansing: Odor Anticipated Due to No N/A N/A Product Use: Wound Margin: Flat and Intact N/A N/A Granulation Amount: None Present (0%) N/A N/A Necrotic Amount: Large (67-100%) N/A N/A Necrotic Tissue: Adherent Slough N/A N/A Exposed Structures: Fat: Yes N/A N/A Fascia: No Tendon: No Muscle: No Joint: No Bone: No Epithelialization: None N/A N/A Periwound Skin Texture: Edema: No N/A N/A Excoriation: No Induration: No Callus: No Crepitus: No Fluctuance: No Friable: No Rash: No Scarring: No Periwound Skin Moist: Yes N/A N/A Moisture: Dry/Scaly: Yes Maceration: No Periwound Skin Color: Atrophie Blanche: No N/A N/A Cyanosis: No Ecchymosis: No Erythema: No Hemosiderin Staining: No Mottled: No Pallor: No Rubor: No Temperature: No Abnormality N/A N/A Tenderness on No N/A N/A Palpation: Wound Preparation: Ulcer Cleansing: N/A N/A Rinsed/Irrigated with Saline Topical Anesthetic [name], [name] ([id number]) Applied: Other: lidociane 4% Treatment Notes Electronic Signature(s) Signed: 09/23/2016 5:09:39 PM By: Regan Lemming BSN, RN Entered By: Regan Lemming on 09/23/2016 15:10:23 [name] ([id number]) -------------------------------------------------------------------------------- Multi-Disciplinary Care Plan Details Patient Name: [name],  Danielle S. Date of Service: 09/23/2016 2:30 PM Medical Record Number: [id number] Patient Account Number: [id number] Date of Birth/Sex: 12-26-1930 (82 y.o. Female) Treating RN: Afful, RN, BSN, Velva Harman Primary Care Physician: [name] Other Clinician: Referring Physician: [name] Treating Physician/Extender: Frann Rider in Treatment: 2 Active Inactive Abuse / Safety / Falls / Self Care Management Nursing Diagnoses: Impaired physical mobility Potential for falls Goals: Patient will remain injury  free Date Initiated: 09/06/2016 Goal Status: Active Interventions: Assess fall risk on admission and as needed Assess self care needs on admission and as needed Notes: Necrotic Tissue Nursing Diagnoses: Impaired tissue integrity related to necrotic/devitalized tissue Goals: Necrotic/devitalized tissue will be minimized in the wound bed Date Initiated: 09/06/2016 Goal Status: Active Interventions: Assess patient pain level pre-, during and post procedure and prior to discharge Notes: Orientation to the Wound Care Program Nursing Diagnoses: Knowledge deficit related to the wound healing center program Goals: [name], [name] ([id number]) Patient/caregiver will verbalize understanding of the [facility] Date Initiated: 09/06/2016 Goal Status: Active Interventions: Provide education on orientation to the wound center Notes: Pressure Nursing Diagnoses: Knowledge deficit related to management of pressures ulcers Potential for impaired tissue integrity related to pressure, friction, moisture, and shear Goals: Patient will remain free of pressure ulcers Date Initiated: 09/06/2016 Goal Status: Active Interventions: Assess: immobility, friction, shearing, incontinence upon admission and as needed Notes: Soft Tissue Infection Nursing Diagnoses: Impaired tissue integrity Potential for infection: soft tissue Goals: Patient will remain free of wound infection Date Initiated: 09/06/2016 Goal Status: Active Interventions: Assess signs and symptoms of infection every visit Notes: Wound/Skin Impairment Nursing Diagnoses: Impaired tissue integrity Goals: Ulcer/skin breakdown will heal within 14 weeks [name], [name] ([id number]) Date Initiated: 09/06/2016 Goal Status: Active Interventions: Assess ulceration(s) every visit Notes: Electronic Signature(s) Signed: 09/23/2016 5:09:39 PM By: Regan Lemming BSN, RN Entered By: Regan Lemming on 09/23/2016 15:00:19 [name] ([id number]) -------------------------------------------------------------------------------- Pain Assessment Details Patient Name: [name]. Date of Service: 09/23/2016 2:30 PM Medical Record Number: [id number] Patient Account Number: [id number] Date of Birth/Sex: 1931/09/26 (82 y.o. Female) Treating RN: Baruch Gouty, RN, BSN, Velva Harman Primary Care Physician: [name] Other Clinician: Referring Physician: [name] Treating Physician/Extender: Frann Rider in Treatment: 2 Active Problems Location of Pain Severity and Description of Pain Patient Has Paino No Site Locations With Dressing Change: No Pain Management and Medication Current Pain Management: Electronic Signature(s) Signed: 09/23/2016 5:09:39 PM By: Regan Lemming BSN, RN Entered By: Regan Lemming on 09/23/2016 14:45:25 [name] ([id number]) -------------------------------------------------------------------------------- Wound Assessment Details Patient Name: [name]. Date of Service: 09/23/2016 2:30 PM Medical Record Number: [id number] Patient Account Number: [id number] Date of Birth/Sex: December 17, 1930 (82 y.o. Female) Treating RN: Afful, RN, BSN, Velva Harman Primary Care Physician: [name] Other Clinician: Referring Physician: [name] Treating Physician/Extender: Frann Rider in Treatment: 2 Wound Status Wound Number: 1 Primary Etiology: Pressure Ulcer Wound Location: Right Calcaneus - Medial Wound Status: Open Wounding Event: Pressure Injury Comorbid History: Cataracts, Hypertension Date Acquired: 07/02/2016 Weeks Of Treatment: 2 Clustered Wound: No Photos Photo Uploaded By: Regan Lemming on 09/23/2016 16:01:23 Wound Measurements Length: (cm) 3.8 Width: (cm) 5 Depth: (cm) 0.1 Area: (cm) 14.923 Volume: (cm) 1.492 % Reduction in Area: -20.6% % Reduction in Volume: -20.6% Epithelialization: None Tunneling: No Undermining: No Wound  Description Classification: Category/Stage III Wound Margin: Flat and Intact Exudate Amount: Medium Exudate Type: Serous Exudate Color: amber [place], [name] ([id number]) Foul Odor After Cleansing: Yes Due to Product Use: No  Wound Bed Granulation Amount: None Present (0%) Necrotic Amount: Large (67-100%) Necrotic Quality: Eschar, Adherent Slough Periwound Skin Texture Texture Color No Abnormalities Noted: No No Abnormalities Noted: No Callus: No Atrophie Blanche: No Crepitus: No Cyanosis: No Excoriation: No Ecchymosis: No Fluctuance: No Erythema: No Friable: No Hemosiderin Staining: No Induration: No Mottled: No Localized Edema: No Pallor: No Rash: No Rubor: No Scarring: No Moisture No Abnormalities Noted: No Dry / Scaly: No Maceration: No Moist: Yes Wound Preparation Ulcer Cleansing: Rinsed/Irrigated with Saline Topical Anesthetic Applied: Other: lidocaine 4%, Electronic Signature(s) Signed: 09/23/2016 5:09:39 PM By: Regan Lemming BSN, RN Entered By: Regan Lemming on 09/23/2016 14:58:56 [name] ([id number]) -------------------------------------------------------------------------------- Wound Assessment Details Patient Name: [name]. Date of Service: 09/23/2016 2:30 PM Medical Record Number: [id number] Patient Account Number: [id number] Date of Birth/Sex: December 24, 1930 (82 y.o. Female) Treating RN: Afful, RN, BSN, [place] Primary Care Physician: [name] Other Clinician: Referring Physician: [name] Treating Physician/Extender: Frann Rider in Treatment: 2 Wound Status Wound Number: 2 Primary Etiology: Pressure Ulcer Wound Location: Left Calcaneus - Medial Wound Status: Open Wounding Event: Pressure Injury Comorbid History: Cataracts, Hypertension Date Acquired: 07/02/2016 Weeks Of Treatment: 2 Clustered Wound: No Photos Photo Uploaded By: Regan Lemming on 09/23/2016 16:02:08 Wound Measurements Length: (cm) 3.5 Width: (cm)  4.5 Depth: (cm) 0.1 Area: (cm) 12.37 Volume: (cm) 1.237 % Reduction in Area: 37.5% % Reduction in Volume: 37.5% Epithelialization: None Tunneling: No Undermining: No Wound Description Classification: Category/Stage III Wound Margin: Flat and Intact Exudate Amount: Small Exudate Type: Serosanguineous Exudate Color: red, brown Foul Odor After Cleansing: Yes Due to Product Use: No Wound Bed Granulation Amount: None Present (0%) Necrotic Amount: Large (67-100%) Necrotic Quality: Eschar Periwound Skin Texture Krigbaum, Danielle S. ([id number]) Texture Color No Abnormalities Noted: No No Abnormalities Noted: No Callus: No Atrophie Blanche: No Crepitus: No Cyanosis: No Excoriation: No Ecchymosis: No Fluctuance: No Erythema: No Friable: No Hemosiderin Staining: No Induration: No Mottled: No Localized Edema: No Pallor: No Rash: No Rubor: No Scarring: No Temperature / Pain Moisture Temperature: No Abnormality No Abnormalities Noted: No Tenderness on Palpation: Yes Dry / Scaly: Yes Maceration: No Moist: Yes Wound Preparation Ulcer Cleansing: Rinsed/Irrigated with Saline Topical Anesthetic Applied: Other: Lidocaine 4%, Electronic Signature(s) Signed: 09/23/2016 5:09:39 PM By: Regan Lemming BSN, RN Entered By: Regan Lemming on 09/23/2016 14:59:20 [name] ([id number]) -------------------------------------------------------------------------------- Wound Assessment Details Patient Name: [name]. Date of Service: 09/23/2016 2:30 PM Medical Record Number: [id number] Patient Account Number: [id number] Date of Birth/Sex: [birth year] (82 y.o. Female) Treating RN: Afful, RN, BSN, [place] Primary Care Physician: [name] Other Clinician: Referring Physician: [name] Treating Physician/Extender: Frann Rider in Treatment: 2 Wound Status Wound Number: 4 Primary Etiology: Fungal Wound Location: Right Toe Fourth - Lateral Secondary Etiology:  Pressure Ulcer Wounding Event: Gradually Appeared Wound Status: Healed - Epithelialized Date Acquired: 08/01/2016 Comorbid History: Cataracts, Hypertension Weeks Of Treatment: 2 Clustered Wound: No Photos Photo Uploaded By: Regan Lemming on 09/23/2016 16:02:46 Wound Measurements Length: (cm) 0 % Reduction Width: (cm) 0 % Reduction Depth: (cm) 0 Epithelializ Area: (cm) 0 Tunneling: Volume: (cm) 0 Undermining in Area: 100% in Volume: 100% ation: Large (67-100%) No : No Wound Description Full Thickness Without Exposed Classification: Support Structures Wound Margin: Flat and Intact Exudate None Present Amount: Foul Odor After Cleansing: No Wound Bed Granulation Amount: None Present (0%) Exposed Structure Necrotic Amount: None Present (0%) Fascia Exposed: No Fat Layer Exposed: No Tendon Exposed: No Muscle Exposed: No [name], Danielle S. (  [id number]) Joint Exposed: No Bone Exposed: No Periwound Skin Texture Texture Color No Abnormalities Noted: No No Abnormalities Noted: No Callus: No Atrophie Blanche: No Crepitus: No Cyanosis: No Excoriation: No Ecchymosis: No Fluctuance: No Erythema: No Friable: No Hemosiderin Staining: No Induration: No Mottled: No Localized Edema: No Pallor: No Rash: No Rubor: No Scarring: No Moisture No Abnormalities Noted: No Dry / Scaly: No Maceration: No Moist: No Wound Preparation Ulcer Cleansing: Rinsed/Irrigated with Saline Topical Anesthetic Applied: None Electronic Signature(s) Signed: 09/23/2016 5:09:39 PM By: Regan Lemming BSN, RN Entered By: Regan Lemming on 09/23/2016 14:59:53 [name] ([id number]) -------------------------------------------------------------------------------- Wound Assessment Details Patient Name: [name]. Date of Service: 09/23/2016 2:30 PM Medical Record Number: [id number] Patient Account Number: [id number] Date of Birth/Sex: 04-12-1931 (82 y.o. Female) Treating RN: Afful, RN, BSN,  [place] Primary Care Physician: [name] Other Clinician: Referring Physician: [name] Treating Physician/Extender: Frann Rider in Treatment: 2 Wound Status Wound Number: 6 Primary Etiology: Fungal Wound Location: Right Toe Third - Lateral Wound Status: Open Wounding Event: Gradually Appeared Comorbid History: Cataracts, Hypertension Date Acquired: 08/02/2016 Weeks Of Treatment: 2 Clustered Wound: No Photos Photo Uploaded By: Regan Lemming on 09/23/2016 16:02:46 Wound Measurements Length: (cm) 0.4 Width: (cm) 0.4 Depth: (cm) 0.2 Area: (cm) 0.126 Volume: (cm) 0.025 % Reduction in Area: 71.4% % Reduction in Volume: 88.6% Epithelialization: None Tunneling: No Undermining: No Wound Description Full Thickness Without Exposed Classification: Support Structures Wound Margin: Flat and Intact Exudate Medium Amount: [name], [name] ([id number]) Foul Odor After Cleansing: Yes Due to Product Use: No Exudate Type: Serosanguineous Exudate Color: red, brown Wound Bed Granulation Amount: None Present (0%) Exposed Structure Necrotic Amount: Large (67-100%) Fascia Exposed: No Necrotic Quality: Adherent Slough Fat Layer Exposed: Yes Tendon Exposed: No Muscle Exposed: No Joint Exposed: No Bone Exposed: No Periwound Skin Texture Texture Color No Abnormalities Noted: No No Abnormalities Noted: No Callus: No Atrophie Blanche: No Crepitus: No Cyanosis: No Excoriation: No Ecchymosis: No Fluctuance: No Erythema: No Friable: No Hemosiderin Staining: No Induration: No Mottled: No Localized Edema: No Pallor: No Rash: No Rubor: No Scarring: No Temperature / Pain Moisture Temperature: No Abnormality No Abnormalities Noted: No Dry / Scaly: Yes Maceration: No Moist: Yes Wound Preparation Ulcer Cleansing: Rinsed/Irrigated with Saline Topical Anesthetic Applied: Other: lidociane 4%, Electronic Signature(s) Signed: 09/23/2016 5:09:39 PM By:  Regan Lemming BSN, RN Entered By: Regan Lemming on 09/23/2016 15:00:12 [name] ([id number]) -------------------------------------------------------------------------------- Vitals Details Patient Name: [name] Date of Service: 09/23/2016 2:30 PM Medical Record Number: [id number] Patient Account Number: [id number] Date of Birth/Sex: July 11, 1931 (82 y.o. Female) Treating RN: Afful, RN, BSN, Velva Harman Primary Care Physician: [name] Other Clinician: Referring Physician: [name] Treating Physician/Extender: Frann Rider in Treatment: 2 Vital Signs Time Taken: 14:47 Temperature (F): 98.7 Height (in): 63 Pulse (bpm): 69 Weight (lbs): 160 Respiratory Rate (breaths/min): 16 Body Mass Index (BMI): 28.3 Blood Pressure (mmHg): 168/69 Reference Range: 80 - 120 mg / dl Electronic Signature(s) Signed: 09/23/2016 5:09:39 PM By: Regan Lemming BSN, RN Entered By: Regan Lemming on 09/23/2016 14:47:39

## 2016-09-24 NOTE — Progress Notes (Addendum)
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 performed by Christin Fudge, MD. with the following instrument(s): Zuniga and Forceps to remove Viable and Non-Viable tissue/material including Fibrin/Slough, Eschar, and Subcutaneous after achieving pain control using Lidocaine 4% Topical Solution. A time out was conducted at 15:11, prior to the start of the procedure. A Minimum amount of bleeding was controlled with Pressure. The procedure was tolerated well with a pain level of 0 throughout and a pain level of 0 following the procedure. Post Debridement Measurements: 3.8cm length x 5cm width x 0.3cm depth; 4.477cm^3 volume. Post debridement Stage noted as Category/Stage III. Character of  Wound/Ulcer Post Debridement requires further  debridement. Severity of Tissue Post Debridement is: Fat layer exposed. Post procedure Diagnosis Wound #1: Same as Pre-Procedure Wound #2 Wound #2 is a Pressure Ulcer located on the Left,Medial Calcaneus . There was a Skin/Subcutaneous Tissue Debridement ([id number]) debridement with total area of 15.75 sq cm performed by Christin Fudge, MD. with the following instrument(s): Zuniga and Forceps to remove Viable and Non-Viable tissue/material including Fibrin/Slough, Eschar, and Subcutaneous after achieving pain control using Lidocaine 4% Topical Solution. A time out was conducted at 15:07, prior to the start of the procedure. A Minimum amount of bleeding was controlled with Pressure. The procedure was tolerated well with a pain level of 0 throughout and a pain level of 0 following the procedure. Post Debridement Measurements: 3.5cm length x 4.5cm width x 0.3cm depth; 3.711cm^3 volume. Post debridement Stage noted as Category/Stage III. Character of Wound/Ulcer Post Debridement requires further debridement. Severity of Tissue Post Debridement is: Fat layer exposed. Post procedure Diagnosis Wound #2: Same as Pre-Procedure Plan Wound Cleansing: Wound #1 Right,Medial Calcaneus: Clean wound with Normal Saline. Wound #2 Left,Medial Calcaneus: Clean wound with Normal Saline. Wound #6 Right,Lateral Toe Third: Clean wound with Normal Saline. Anesthetic: Wound #1 Right,Medial Calcaneus: Topical Lidocaine 4% cream applied to wound bed prior to debridement Wound #2 Left,Medial Calcaneus: Topical Lidocaine 4% cream applied to wound bed prior to debridement Wound #6 Right,Lateral Toe Third: Topical Lidocaine 4% cream applied to wound bed prior to debridement Primary Wound Dressing: Wound #1 Right,Medial Calcaneus: Santyl Ointment LAKITHA, GORDY ([id number]) Wound #2 Left,Medial Calcaneus: Santyl Ointment Wound #6 Right,Lateral Toe Third: Aquacel Ag Secondary Dressing: Wound #1 Right,Medial  Calcaneus: Conform/Kerlix Other - heel cups Wound #2 Left,Medial Calcaneus: Conform/Kerlix Other - heel cups Wound #6 Right,Lateral Toe Third: Conform/Kerlix Dressing Change Frequency: Wound #1 Right,Medial Calcaneus: Change dressing every day. Wound #2 Left,Medial Calcaneus: Change dressing every day. Wound #6 Right,Lateral Toe Third: Change dressing every other day. Follow-up Appointments: Wound #1 Right,Medial Calcaneus: Return Appointment in 1 week. Wound #2 Left,Medial Calcaneus: Return Appointment in 1 week. Wound #6 Right,Lateral Toe Third: Return Appointment in 1 week. Off-Loading: Wound #1 Right,Medial Calcaneus: Other: - float heels when in bed; keep pressure off during the day Wound #2 Left,Medial Calcaneus: Other: - float heels when in bed; keep pressure off during the day Home Health: Wound #1 Right,Medial Calcaneus: Continue Home Health Visits - [facility] Nurse may visit PRN to address patient s wound care needs. FACE TO FACE ENCOUNTER: MEDICARE and MEDICAID PATIENTS: I certify that this patient is under my care and that I had a face-to-face encounter that meets the physician face-to-face encounter requirements with this patient on this date. The encounter with the patient was in whole or in part for the following MEDICAL CONDITION: (primary reason for [place]) MEDICAL NECESSITY: I certify, that based on my findings, NURSING services are a medically necessary home health service. HOME BOUND STATUS: I certify that my clinical findings support that this patient is homebound (i.e., Due to illness or injury, pt requires aid of supportive devices such as crutches, cane, wheelchairs, walkers, the use of special transportation or the assistance of another person to leave their place of residence. There is a normal inability to leave the home and doing so requires considerable and taxing effort. Other absences are for medical reasons / religious  services and are infrequent or of short duration when for other reasons). If current dressing causes regression in wound condition, may D/C ordered dressing  product/s and apply Normal Saline Moist Dressing daily until next [place] / Other MD appointment. [place] of regression in wound condition at [phone]. Please direct any NON-WOUND related issues/requests for orders to patient's Primary Care Physician Wound #2 Left,Medial Calcaneus: VAANI, MORREN ([id number]) [place] Visits - [place] Nurse may visit PRN to address patient s wound care needs. FACE TO FACE ENCOUNTER: MEDICARE and MEDICAID PATIENTS: I certify that this patient is under my care and that I had a face-to-face encounter that meets the physician face-to-face encounter requirements with this patient on this date. The encounter with the patient was in whole or in part for the following MEDICAL CONDITION: (primary reason for [place]) MEDICAL NECESSITY: I certify, that based on my findings, NURSING services are a medically necessary home health service. HOME BOUND STATUS: I certify that my clinical findings support that this patient is homebound (i.e., Due to illness or injury, pt requires aid of supportive devices such as crutches, cane, wheelchairs, walkers, the use of special transportation or the assistance of another person to leave their place of residence. There is a normal inability to leave the home and doing so requires considerable and taxing effort. Other absences are for medical reasons / religious services and are infrequent or of short duration when for other reasons). If current dressing causes regression in wound condition, may D/C ordered dressing product/s and apply Normal Saline Moist Dressing daily until next [place] / Other MD appointment. [place] of regression in wound condition at [phone]. Please direct  any NON-WOUND related issues/requests for orders to patient's Primary Care Physician Wound #6 Right,Lateral Toe Third: [place] Visits - [place] Nurse may visit PRN to address patient s wound care needs. FACE TO FACE ENCOUNTER: MEDICARE and MEDICAID PATIENTS: I certify that this patient is under my care and that I had a face-to-face encounter that meets the physician face-to-face encounter requirements with this patient on this date. The encounter with the patient was in whole or in part for the following MEDICAL CONDITION: (primary reason for [place]) MEDICAL NECESSITY: I certify, that based on my findings, NURSING services are a medically necessary home health service. HOME BOUND STATUS: I certify that my clinical findings support that this patient is homebound (i.e., Due to illness or injury, pt requires aid of supportive devices such as crutches, cane, wheelchairs, walkers, the use of special transportation or the assistance of another person to leave their place of residence. There is a normal inability to leave the home and doing so requires considerable and taxing effort. Other absences are for medical reasons / religious services and are infrequent or of short duration when for other reasons). If current dressing causes regression in wound condition, may D/C ordered dressing product/s and apply Normal Saline Moist Dressing daily until next [place] / Other MD appointment. [place] of regression in wound condition at [phone]. Please direct any NON-WOUND related issues/requests for orders to patient's Primary Care Physician I have recommended: 1. Santyl ointment to both heels, heel cups and offloading with Sage boots. 2. Silver alginate to be placed between her toes on the right foot. 3. x-rays of both feet -- she will get it done this week -- x-ray results discussed with the patient 4. Adequate proteins, vitamin A,  vitamin C and zinc. Electronic Signature(s) Signed: 09/23/2016 3:53:19 PM By: Christin Fudge MD, FACS [name] ([id number]) Entered  By: Christin Fudge on 09/23/2016 15:53:19 [place], [name] ([id number]) -------------------------------------------------------------------------------- SuperBill Details Patient Name: [name]. Date of Service: 09/23/2016 Medical Record Number: [id number] Patient Account Number: [id number] Date of Birth/Sex: 11/25/1930 (82 y.o. 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 Facility Procedures CPT4 Code Description: [id number] 11042 - DEB SUBQ TISSUE 20 SQ CM/< ICD-10 Description Diagnosis L89.620 Pressure ulcer of left heel, unstageable L89.610 Pressure ulcer of right heel, unstageable 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 Z99.3 Dependence on wheelchair Modifier: ot with fat la Quantity: 1 yer exposed CPT4 Code Description: [id number] 11045 - DEB SUBQ TISS EA ADDL 20CM ICD-10 Description Diagnosis L89.620 Pressure ulcer of left heel, unstageable L89.610 Pressure ulcer of right heel, unstageable 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 Z99.3 Dependence on wheelchair Modifier: ot with fat la Quantity: 1 yer exposed Physician Procedures CPT4 Code Description: 1840375 99213 - WC PHYS LEVEL 3 - EST PT ICD-10 Description Diagnosis L89.620 Pressure ulcer of left heel, unstageable L89.610 Pressure ulcer of right heel, unstageable L97.512 Non-pressure chronic ulcer of other part of right foo  Z99.3  Dependence on wheelchair RAYEN, PALEN ([id number]) Modifier: 25 t with fat lay Quantity: 1 er exposed Electronic Signature(s) Signed: 09/23/2016 3:53:47 PM By: Christin Fudge MD, FACS Entered By: Christin Fudge on 09/23/2016 15:53:46

## 2016-10-04 ENCOUNTER — Encounter: Payer: Medicare HMO | Admitting: Surgery

## 2016-10-04 DIAGNOSIS — L89620 Pressure ulcer of left heel, unstageable: Secondary | ICD-10-CM | POA: Diagnosis not present

## 2016-10-05 NOTE — Progress Notes (Signed)
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 another person to leave their place of residence. There is a normal inability to leave the home and doing so requires considerable and taxing effort. Other absences are for medical reasons / religious services and are infrequent or of short duration when for other reasons). If current dressing causes regression in wound condition, may D/C ordered dressing product/s and apply Normal Saline Moist Dressing daily until next [place] / Other MD appointment. [place] of regression in wound condition at [phone]. Please direct any NON-WOUND related issues/requests for orders to patient's Primary Care Physician Wound #2 Left,Medial Calcaneus: [place] Visits - [place] Nurse [phone]  visit PRN to address patient s wound care needs. FACE TO FACE ENCOUNTER: MEDICARE and MEDICAID PATIENTS: I certify that this patient is under my care and that I had a face-to-face encounter  that meets the physician face-to-face encounter requirements with this patient on this date. The encounter with the patient was in whole or in part for the following MEDICAL CONDITION: (primary reason for [place]) MEDICAL NECESSITY: I certify, that based on my findings, NURSING services are a medically necessary home health service. HOME BOUND STATUS: I certify that my clinical findings support that this patient is homebound (i.e., Due to illness or injury, pt requires aid of supportive devices such as crutches, cane, wheelchairs, walkers, the use of special transportation or the assistance of another person to leave their place of residence. There is a normal inability to leave the home and doing so requires considerable and taxing effort. Other absences are for medical reasons / religious services and are infrequent or of short duration when for other reasons). If current dressing causes regression in wound condition, may D/C ordered dressing product/s and apply Normal Saline Moist Dressing daily until next [place] / Other MD appointment. [place] of regression in wound condition at [phone]. Please direct any NON-WOUND related issues/requests for orders to patient's Primary Care Physician I have recommended: 1. Santyl ointment to both heels, heel cups and offloading with Sage boots. 2. Silver alginate to be placed between her toes on the right foot. 3. Adequate proteins, vitamin A, vitamin C and zinc. Electronic Signature(s) Signed: 10/04/2016 1:39:15 PM By: Christin Fudge MD, FACS Entered By: Christin Fudge on 10/04/2016 13:39:15 [name], [name] ([id number]) [name], [name] ([id number]) -------------------------------------------------------------------------------- [facility]Bill Details Patient Name: [name] Date of Service: 10/04/2016 Medical Record Number: [id number] Patient Account Number: [id number] Date of Birth/Sex: April 19, 1931 (82 y.o.  Female) Treating RN: Cornell Barman Primary Care Physician: [name] Other Clinician: Referring Physician: [name] Treating Physician/Extender: Frann Rider in Treatment: 4 Diagnosis Coding ICD-10 Codes Code Description [phone] Pressure ulcer of left heel, unstageable L89.610 Pressure ulcer of right heel, unstageable L97.512 Non-pressure chronic ulcer of other part of right foot with fat layer exposed Z99.3 Dependence on wheelchair Facility Procedures CPT4 Code Description: [id number] 11042 - DEB SUBQ TISSUE 20 SQ CM/< ICD-10 Description Diagnosis L89.620 Pressure ulcer of left heel, unstageable L89.610 Pressure ulcer of right heel, unstageable 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 Modifier: ot with fat la Quantity: 1 yer exposed CPT4 Code Description: [id number] 11045 - DEB SUBQ TISS EA ADDL 20CM ICD-10 Description Diagnosis L89.620 Pressure ulcer of left heel, unstageable L89.610 Pressure ulcer of right heel, unstageable 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 Modifier: ot with fat la Quantity: 1 yer exposed Physician Procedures CPT4 Code Description: [id number] 11042 - WC PHYS SUBQ TISS 20 SQ CM ICD-10 Description Diagnosis L89.620 Pressure ulcer of left heel, unstageable L89.610 Pressure ulcer of right heel, unstageable 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o Modifier: t with fat lay Quantity: 1 er exposed CPT4 Code Description: 5916384 11045 - WC PHYS SUBQ TISS EA ADDL 20 CM Description BRITNAY, MAGNUSSEN ([id number]) Modifier: Quantity: 1 Electronic Signature(s) Signed: 10/04/2016 1:39:40 PM By: Christin Fudge MD, FACS Entered By: Christin Fudge on 10/04/2016 13:39:40

## 2016-10-05 NOTE — Progress Notes (Signed)
KAROLYN, MESSING ([id number]) Visit Report for 10/04/2016 Arrival Information Details Patient Name: [name], [name]. Date of Service: 10/04/2016 12:30 PM Medical Record Number: [id number] Patient Account Number: [id number] Date of Birth/Sex: April 07, 1931 (82 y.o. Female) Treating RN: Cornell Barman Primary Care Physician: [name] Other Clinician: Referring Physician: [name] Treating Physician/Extender: Frann Rider in Treatment: 4 Visit Information History Since Last Visit Added or deleted any medications: No Patient Arrived: Wheel Chair Any new allergies or adverse reactions: No Arrival Time: 12:50 Had a fall or experienced change in No activities of daily living that may affect Accompanied By: self risk of falls: Transfer Assistance: Hoyer Lift Signs or symptoms of abuse/neglect since last No Patient Identification Verified: Yes visito Secondary Verification Process Yes Hospitalized since last visit: No Completed: Has Dressing in Place as Prescribed: Yes Patient Requires Transmission-Based No Pain Present Now: No Precautions: Patient Has Alerts: No Electronic Signature(s) Signed: 10/04/2016 5:05:40 PM By: Gretta Cool, RN, BSN, Kim RN, BSN Entered By: Gretta Cool, RN, BSN, Kim on 10/04/2016 12:51:09 [name] ([id number]) -------------------------------------------------------------------------------- Encounter Discharge Information Details Patient Name: [name]. Date of Service: 10/04/2016 12:30 PM Medical Record Number: [id number] Patient Account Number: [id number] Date of Birth/Sex: 1931-05-07 (82 y.o. Female) Treating RN: Cornell Barman Primary Care Physician: [name] Other Clinician: Referring Physician: [name] Treating Physician/Extender: Frann Rider in Treatment: 4 Encounter Discharge Information Items Schedule Follow-up Appointment: No Medication Reconciliation completed and provided to Patient/Care  No Elizbeth Posa: Provided on Clinical Summary of Care: 10/04/2016 Form Type Recipient Paper Patient EB Electronic Signature(s) Signed: 10/04/2016 1:35:34 PM By: Ruthine Dose Entered By: Ruthine Dose on 10/04/2016 13:35:34 Chiarelli, [name] ([id number]) -------------------------------------------------------------------------------- Lower Extremity Assessment Details Patient Name: [name]. Date of Service: 10/04/2016 12:30 PM Medical Record Number: [id number] Patient Account Number: [id number] Date of Birth/Sex: 1931/04/05 (82 y.o. Female) Treating RN: Cornell Barman Primary Care Physician: [name] Other Clinician: Referring Physician: [name] Treating Physician/Extender: Frann Rider in Treatment: 4 Edema Assessment Assessed: [Left: No] [Right: No] Edema: [Left: No] [Right: No] Vascular Assessment Claudication: Claudication Assessment [Left:None] [Right:None] Pulses: Posterior Tibial Popliteal Doppler Audible: [Left:Yes] [Right:Yes] Extremity colors, hair growth, and conditions: Extremity Color: [Left:Dusky] [Right:Dusky] Hair Growth on Extremity: [Left:No] [Right:No] Temperature of Extremity: [Left:Warm] [Right:Warm] Capillary Refill: [Left:< 3 seconds] [Right:< 3 seconds] Dependent Rubor: [Left:No] [Right:No] Blanched when Elevated: [Left:No] [Right:No] Lipodermatosclerosis: [Left:No] [Right:No] Toe Nail Assessment Left: Right: Thick: Yes Yes Discolored: Yes Yes Deformed: Yes Yes Improper Length and Hygiene: Yes Yes Electronic Signature(s) Signed: 10/04/2016 5:05:40 PM By: Gretta Cool, RN, BSN, Kim RN, BSN Entered By: Gretta Cool, RN, BSN, Kim on 10/04/2016 13:02:17 [name] ([id number]) -------------------------------------------------------------------------------- Multi Wound Chart Details Patient Name: [name]. Date of Service: 10/04/2016 12:30 PM Medical Record Number: [id number] Patient Account Number: [id number] Date of Birth/Sex:  [birth year] (82 y.o. Female) Treating RN: Cornell Barman Primary Care Physician: [name] Other Clinician: Referring Physician: [name] Treating Physician/Extender: Frann Rider in Treatment: 4 Vital Signs Height(in): 63 Pulse(bpm): 70 Weight(lbs): 160 Blood Pressure 148/90 (mmHg): Body Mass Index(BMI): 28 Temperature(F): 98.5 Respiratory Rate 16 (breaths/min): Photos: [1:No Photos] [2:No Photos] [6:No Photos] Wound Location: [1:Right Calcaneus - Medial] [2:Left Calcaneus - Medial] [6:Right Toe Third - Lateral] Wounding Event: [1:Pressure Injury] [2:Pressure Injury] [6:Gradually Appeared] Primary Etiology: [1:Pressure Ulcer] [2:Pressure Ulcer] [6:Fungal] Comorbid History: [1:Cataracts, Hypertension] [2:Cataracts, Hypertension] [6:Cataracts, Hypertension] Date Acquired: [1:07/02/2016] [2:07/02/2016] [6:08/02/2016] Weeks of Treatment: [1:4] [2:4] [6:4] Wound Status: [1:Open] [2:Open] [6:Open] Measurements L x W x D 3.8x5x0.3 [2:3.2x4.5x0.3] [6:0.2x0.4x0.1] (  cm) Area (cm) : [1:14.923] [2:11.31] [6:0.063] Volume (cm) : [1:4.477] [2:3.393] [6:0.006] % Reduction in Area: [1:-20.60%] [2:42.90%] [6:85.70%] % Reduction in Volume: -261.90% [2:-71.50%] [6:97.30%] Classification: [1:Category/Stage III] [2:Category/Stage III] [6:Full Thickness Without Exposed Support Structures] Exudate Amount: [1:Medium] [2:Large] [6:Medium] Exudate Type: [1:Serous] [2:Serosanguineous] [6:Serosanguineous] Exudate Color: [1:amber] [2:red, brown] [6:red, brown] Foul Odor After [1:Yes] [2:Yes] [6:Yes] Cleansing: Odor Anticipated Due to [1:No] [2:No] [6:No] Product Use: Wound Margin: [1:Flat and Intact] [2:Flat and Intact] [6:Flat and Intact] Granulation Amount: [1:None Present (0%)] [2:None Present (0%)] [6:None Present (0%)] Necrotic Amount: [1:Large (67-100%)] [2:Large (67-100%)] [6:Large (67-100%)] Necrotic Tissue: [1:Eschar, Adherent Slough] [2:Eschar] [6:Eschar] Exposed  Structures: [1:Fat: Yes] [2:Fat: Yes] [6:Fat: Yes Fascia: No] Tendon: No Muscle: No Joint: No Bone: No Epithelialization: None None None Periwound Skin Texture: Edema: No Edema: No Edema: No Excoriation: No Excoriation: No Excoriation: No Induration: No Induration: No Induration: No Callus: No Callus: No Callus: No Crepitus: No Crepitus: No Crepitus: No Fluctuance: No Fluctuance: No Fluctuance: No Friable: No Friable: No Friable: No Rash: No Rash: No Rash: No Scarring: No Scarring: No Scarring: No Periwound Skin Moist: Yes Moist: Yes Dry/Scaly: Yes Moisture: Maceration: No Maceration: No Maceration: No Dry/Scaly: No Dry/Scaly: No Moist: No Periwound Skin Color: Atrophie Blanche: No Atrophie Blanche: No Atrophie Blanche: No Cyanosis: No Cyanosis: No Cyanosis: No Ecchymosis: No Ecchymosis: No Ecchymosis: No Erythema: No Erythema: No Erythema: No Hemosiderin Staining: No Hemosiderin Staining: No Hemosiderin Staining: No Mottled: No Mottled: No Mottled: No Pallor: No Pallor: No Pallor: No Rubor: No Rubor: No Rubor: No Temperature: No Abnormality No Abnormality No Abnormality Tenderness on Yes Yes No Palpation: Wound Preparation: Ulcer Cleansing: Ulcer Cleansing: Ulcer Cleansing: Rinsed/Irrigated with Rinsed/Irrigated with Rinsed/Irrigated with Saline Saline Saline Topical Anesthetic Topical Anesthetic Topical Anesthetic Applied: Other: lidocaine Applied: Other: Lidocaine Applied: Other: lidociane 4% 4% 4% Treatment Notes Electronic Signature(s) Signed: 10/04/2016 5:05:40 PM By: Gretta Cool, RN, BSN, Kim RN, BSN Entered By: Gretta Cool, RN, BSN, Kim on 10/04/2016 13:12:44 [name] ([id number]) -------------------------------------------------------------------------------- [place] Details Patient Name: [name] Date of Service: 10/04/2016 12:30 PM Medical Record Number: [id number] Patient Account Number:  [id number] Date of Birth/Sex: [birth year] (82 y.o. Female) Treating RN: Cornell Barman Primary Care Physician: [name] Other Clinician: Referring Physician: [name] Treating Physician/Extender: Frann Rider in Treatment: 4 Active Inactive Abuse / Safety / Falls / Self Care Management Nursing Diagnoses: Impaired physical mobility Potential for falls Goals: Patient will remain injury free Date Initiated: 09/06/2016 Goal Status: Active Interventions: Assess fall risk on admission and as needed Assess self care needs on admission and as needed Notes: Necrotic Tissue Nursing Diagnoses: Impaired tissue integrity related to necrotic/devitalized tissue Goals: Necrotic/devitalized tissue will be minimized in the wound bed Date Initiated: 09/06/2016 Goal Status: Active Interventions: Assess patient pain level pre-, during and post procedure and prior to discharge Notes: Orientation to the Wound Care Program Nursing Diagnoses: Knowledge deficit related to the wound healing center program Goals: MYKAH, BELLOMO ([id number]) Patient/caregiver will verbalize understanding of the [place] Program Date Initiated: 09/06/2016 Goal Status: Active Interventions: Provide education on orientation to the wound center Notes: Pressure Nursing Diagnoses: Knowledge deficit related to management of pressures ulcers Potential for impaired tissue integrity related to pressure, friction, moisture, and shear Goals: Patient will remain free of pressure ulcers Date Initiated: 09/06/2016 Goal Status: Active Interventions: Assess: immobility, friction, shearing, incontinence upon admission and as needed Notes: Soft Tissue Infection Nursing Diagnoses: Impaired tissue integrity Potential for infection: soft tissue Goals:  Patient will remain free of wound infection Date Initiated: 09/06/2016 Goal Status: Active Interventions: Assess signs and symptoms of infection  every visit Notes: Wound/Skin Impairment Nursing Diagnoses: Impaired tissue integrity Goals: Ulcer/skin breakdown will heal within 14 weeks ESTELLAR, CADENA ([id number]) Date Initiated: 09/06/2016 Goal Status: Active Interventions: Assess ulceration(s) every visit Notes: Electronic Signature(s) Signed: 10/04/2016 5:05:40 PM By: Gretta Cool, RN, BSN, Kim RN, BSN Entered By: Gretta Cool, RN, BSN, Kim on 10/04/2016 13:12:37 [name] ([id number]) -------------------------------------------------------------------------------- Pain Assessment Details Patient Name: [name]. Date of Service: 10/04/2016 12:30 PM Medical Record Number: [id number] Patient Account Number: [id number] Date of Birth/Sex: 09/20/1931 (82 y.o. Female) Treating RN: Cornell Barman Primary Care Physician: [name] Other Clinician: Referring Physician: [name] Treating Physician/Extender: Frann Rider in Treatment: 4 Active Problems Location of Pain Severity and Description of Pain Patient Has Paino No Site Locations With Dressing Change: No Pain Management and Medication Current Pain Management: Electronic Signature(s) Signed: 10/04/2016 5:05:40 PM By: Gretta Cool, RN, BSN, Kim RN, BSN Entered By: Gretta Cool, RN, BSN, Kim on 10/04/2016 12:51:39 [name] ([id number]) -------------------------------------------------------------------------------- Wound Assessment Details Patient Name: [name]. Date of Service: 10/04/2016 12:30 PM Medical Record Number: [id number] Patient Account Number: [id number] Date of Birth/Sex: Oct 02, 1931 (82 y.o. Female) Treating RN: Cornell Barman Primary Care Physician: [name] Other Clinician: Referring Physician: [name] Treating Physician/Extender: Frann Rider in Treatment: 4 Wound Status Wound Number: 1 Primary Etiology: Pressure Ulcer Wound Location: Right Calcaneus - Medial Wound Status: Open Wounding Event: Pressure  Injury Comorbid History: Cataracts, Hypertension Date Acquired: 07/02/2016 Weeks Of Treatment: 4 Clustered Wound: No Photos Photo Uploaded By: Gretta Cool, RN, BSN, Kim on 10/04/2016 15:46:12 Wound Measurements Length: (cm) 3.8 Width: (cm) 5 Depth: (cm) 0.3 Area: (cm) 14.923 Volume: (cm) 4.477 % Reduction in Area: -20.6% % Reduction in Volume: -261.9% Epithelialization: None Tunneling: No Undermining: No Wound Description Classification: Category/Stage III Wound Margin: Flat and Intact Exudate Amount: Medium Exudate Type: Serous Exudate Color: amber Foul Odor After Cleansing: Yes Due to Product Use: No Wound Bed Granulation Amount: None Present (0%) Exposed Structure Necrotic Amount: Large (67-100%) Fat Layer Exposed: Yes Necrotic Quality: Eschar, Adherent Slough Periwound Skin Texture Texture Color No Abnormalities Noted: No No Abnormalities Noted: No CHALISE, PE. ([id number]) Callus: No Atrophie Blanche: No Crepitus: No Cyanosis: No Excoriation: No Ecchymosis: No Fluctuance: No Erythema: No Friable: No Hemosiderin Staining: No Induration: No Mottled: No Localized Edema: No Pallor: No Rash: No Rubor: No Scarring: No Temperature / Pain Moisture Temperature: No Abnormality No Abnormalities Noted: No Tenderness on Palpation: Yes Dry / Scaly: No Maceration: No Moist: Yes Wound Preparation Ulcer Cleansing: Rinsed/Irrigated with Saline Topical Anesthetic Applied: Other: lidocaine 4%, Electronic Signature(s) Signed: 10/04/2016 5:05:40 PM By: Gretta Cool, RN, BSN, Kim RN, BSN Entered By: Gretta Cool, RN, BSN, Kim on 10/04/2016 13:03:50 [name] ([id number]) -------------------------------------------------------------------------------- Wound Assessment Details Patient Name: [name] Date of Service: 10/04/2016 12:30 PM Medical Record Number: [id number] Patient Account Number: [id number] Date of Birth/Sex: [birth year] (82 y.o. Female) Treating RN: Cornell Barman Primary Care Physician: [name] Other Clinician: Referring Physician: [name] Treating Physician/Extender: Frann Rider in Treatment: 4 Wound Status Wound Number: 2 Primary Etiology: Pressure Ulcer Wound Location: Left Calcaneus - Medial Wound Status: Open Wounding Event: Pressure Injury Comorbid History: Cataracts, Hypertension Date Acquired: 07/02/2016 Weeks Of Treatment: 4 Clustered Wound: No Photos Photo Uploaded By: Gretta Cool, RN, BSN, Kim on 10/04/2016 15:46:12 Wound Measurements Length: (cm) 3.2 Width: (cm) 4.5 Depth: (cm) 0.3  Area: (cm) 11.31 Volume: (cm) 3.393 % Reduction in Area: 42.9% % Reduction in Volume: -71.5% Epithelialization: None Tunneling: No Undermining: No Wound Description Classification: Category/Stage III Wound Margin: Flat and Intact Exudate Amount: Large Exudate Type: Serosanguineous Exudate Color: red, brown Foul Odor After Cleansing: Yes Due to Product Use: No Wound Bed Granulation Amount: None Present (0%) Exposed Structure Necrotic Amount: Large (67-100%) Fat Layer Exposed: Yes Necrotic Quality: Eschar Periwound Skin Texture Texture Color No Abnormalities Noted: No No Abnormalities Noted: No Strickland, Nakeya S. ([id number]) Callus: No Atrophie Blanche: No Crepitus: No Cyanosis: No Excoriation: No Ecchymosis: No Fluctuance: No Erythema: No Friable: No Hemosiderin Staining: No Induration: No Mottled: No Localized Edema: No Pallor: No Rash: No Rubor: No Scarring: No Temperature / Pain Moisture Temperature: No Abnormality No Abnormalities Noted: No Tenderness on Palpation: Yes Dry / Scaly: No Maceration: No Moist: Yes Wound Preparation Ulcer Cleansing: Rinsed/Irrigated with Saline Topical Anesthetic Applied: Other: Lidocaine 4%, Electronic Signature(s) Signed: 10/04/2016 5:05:40 PM By: Gretta Cool, RN, BSN, Kim RN, BSN Entered By: Gretta Cool, RN, BSN, Kim on 10/04/2016 13:04:16 [name]  ([id number]) -------------------------------------------------------------------------------- Wound Assessment Details Patient Name: [name] Date of Service: 10/04/2016 12:30 PM Medical Record Number: [id number] Patient Account Number: [id number] Date of Birth/Sex: 1930-10-23 (82 y.o. Female) Treating RN: Cornell Barman Primary Care Physician: [name] Other Clinician: Referring Physician: [name] Treating Physician/Extender: Frann Rider in Treatment: 4 Wound Status Wound Number: 6 Primary Etiology: Fungal Wound Location: Right Toe Third - Lateral Wound Status: Healed - Epithelialized Wounding Event: Gradually Appeared Comorbid History: Cataracts, Hypertension Date Acquired: 08/02/2016 Weeks Of Treatment: 4 Clustered Wound: No Photos Photo Uploaded By: Gretta Cool, RN, BSN, Kim on 10/04/2016 15:46:31 Wound Measurements Length: (cm) 0 % Reduction i Width: (cm) 0 % Reduction i Depth: (cm) 0 Epithelializa Area: (cm) 0 Tunneling: Volume: (cm) 0 Undermining: n Area: 100% n Volume: 100% tion: Large (67-100%) No No Wound Description Full Thickness Without Exposed Classification: Support Structures Wound Margin: Flat and Intact Exudate Medium Amount: Exudate Type: Serosanguineous Exudate Color: red, brown Foul Odor After Cleansing: Yes Due to Product Use: No Wound Bed Granulation Amount: None Present (0%) Exposed Structure Necrotic Amount: None Present (0%) Fascia Exposed: No Fat Layer Exposed: Yes Tendon Exposed: No Muscle Exposed: No Seelye, Shandi S. ([id number]) Joint Exposed: No Bone Exposed: No Periwound Skin Texture Texture Color No Abnormalities Noted: No No Abnormalities Noted: No Callus: No Atrophie Blanche: No Crepitus: No Cyanosis: No Excoriation: No Ecchymosis: No Fluctuance: No Erythema: No Friable: No Hemosiderin Staining: No Induration: No Mottled: No Localized Edema: No Pallor: No Rash: No Rubor:  No Scarring: No Temperature / Pain Moisture Temperature: No Abnormality No Abnormalities Noted: No Dry / Scaly: Yes Maceration: No Moist: No Wound Preparation Ulcer Cleansing: Rinsed/Irrigated with Saline Topical Anesthetic Applied: None Electronic Signature(s) Signed: 10/04/2016 5:05:40 PM By: Gretta Cool, RN, BSN, Kim RN, BSN Entered By: Gretta Cool, RN, BSN, Kim on 10/04/2016 13:17:52 [name] ([id number]) -------------------------------------------------------------------------------- [place] Details Patient Name: [name]. Date of Service: 10/04/2016 12:30 PM Medical Record Number: [id number] Patient Account Number: [id number] Date of Birth/Sex: 1931/09/04 (82 y.o. Female) Treating RN: Cornell Barman Primary Care Physician: [name] Other Clinician: Referring Physician: [name] Treating Physician/Extender: Frann Rider in Treatment: 4 Vital Signs Time Taken: 12:50 Temperature (F): 98.5 Height (in): 63 Pulse (bpm): 70 Weight (lbs): 160 Respiratory Rate (breaths/min): 16 Body Mass Index (BMI): 28.3 Blood Pressure (mmHg): 148/90 Reference Range: 80 - 120 mg / dl Electronic Signature(s) Signed: 10/04/2016  5:05:40 PM By: Gretta Cool, RN, BSN, Kim RN, BSN Entered By: Gretta Cool, RN, BSN, Kim on 10/04/2016 12:52:25

## 2016-10-15 ENCOUNTER — Encounter: Payer: Medicare HMO | Admitting: Surgery

## 2016-10-15 ENCOUNTER — Ambulatory Visit: Payer: Medicare HMO | Admitting: Surgery

## 2016-10-15 DIAGNOSIS — L89620 Pressure ulcer of left heel, unstageable: Secondary | ICD-10-CM | POA: Diagnosis not present

## 2016-10-16 NOTE — Progress Notes (Signed)
JARELYN, BAMBACH ([id number]) Visit Report for 10/15/2016 Arrival Information Details Patient Name: [name], [name]. Date of Service: 10/15/2016 12:45 PM Medical Record Number: [id number] Patient Account Number: [id number] Date of Birth/Sex: 04-Feb-1931 (82 y.o. Female) Treating RN: Afful, RN, BSN, Velva Harman Primary Care Physician: [name] Other Clinician: Referring Physician: [name] Treating Physician/Extender: Frann Rider in Treatment: 5 Visit Information History Since Last Visit All ordered tests and consults were completed: No Patient Arrived: Wheel Chair Added or deleted any medications: No Arrival Time: 12:40 Any new allergies or adverse reactions: No Accompanied By: self Had a fall or experienced change in No activities of daily living that may affect Transfer Assistance: None risk of falls: Patient Identification Verified: Yes Signs or symptoms of abuse/neglect since last No Secondary Verification Process Yes visito Completed: Hospitalized since last visit: No Patient Requires Transmission-Based No Has Dressing in Place as Prescribed: Yes Precautions: Pain Present Now: No Patient Has Alerts: No Electronic Signature(s) Signed: 10/15/2016 5:28:47 PM By: Regan Lemming BSN, RN Entered By: Regan Lemming on 10/15/2016 12:40:59 [name] ([id number]) -------------------------------------------------------------------------------- Encounter Discharge Information Details Patient Name: [name]. Date of Service: 10/15/2016 12:45 PM Medical Record Number: [id number] Patient Account Number: [id number] Date of Birth/Sex: [birth year] (82 y.o. Female) Treating RN: Afful, RN, BSN, Velva Harman Primary Care Physician: [name] Other Clinician: Referring Physician: [name] Treating Physician/Extender: Frann Rider in Treatment: 5 Encounter Discharge Information Items Discharge Pain Level: 0 Discharge Condition: Stable Ambulatory Status:  Wheelchair Discharge Destination: Home Transportation: Other Accompanied By: self Schedule Follow-up Appointment: No Medication Reconciliation completed and provided to Patient/Care No [name]: Provided on Clinical Summary of Care: 10/15/2016 Form Type Recipient Paper Patient EB Electronic Signature(s) Signed: 10/15/2016 1:22:21 PM By: Regan Lemming BSN, RN Previous Signature: 10/15/2016 1:13:56 PM Version By: Ruthine Dose Entered By: Regan Lemming on 10/15/2016 13:22:21 [name] ([id number]) -------------------------------------------------------------------------------- Lower Extremity Assessment Details Patient Name: [name]. Date of Service: 10/15/2016 12:45 PM Medical Record Number: [id number] Patient Account Number: [id number] Date of Birth/Sex: 1931-08-26 (82 y.o. Female) Treating RN: Afful, RN, BSN, Velva Harman Primary Care Physician: [name] Other Clinician: Referring Physician: [name] Treating Physician/Extender: Frann Rider in Treatment: 5 Edema Assessment Assessed: [Left: No] [Right: No] Edema: [Left: No] [Right: No] Vascular Assessment Pulses: Posterior Tibial Extremity colors, hair growth, and conditions: Extremity Color: [Left:Dusky] [Right:Dusky] Hair Growth on Extremity: [Left:No] [Right:No] Temperature of Extremity: [Left:Warm] [Right:Warm] Capillary Refill: [Left:< 3 seconds] [Right:< 3 seconds] Toe Nail Assessment Left: Right: Thick: Yes Yes Discolored: Yes Yes Deformed: Yes Yes Improper Length and Hygiene: Yes Yes Electronic Signature(s) Signed: 10/15/2016 5:28:47 PM By: Regan Lemming BSN, RN Entered By: Regan Lemming on 10/15/2016 12:41:42 [name] ([id number]) -------------------------------------------------------------------------------- Multi Wound Chart Details Patient Name: [name]. Date of Service: 10/15/2016 12:45 PM Medical Record Number: [id number] Patient Account Number: [id number] Date of  Birth/Sex: [birth year] (82 y.o. Female) Treating RN: Baruch Gouty, RN, BSN, Velva Harman Primary Care Physician: [name] Other Clinician: Referring Physician: [name] Treating Physician/Extender: Frann Rider in Treatment: 5 Vital Signs Height(in): 63 Pulse(bpm): 72 Weight(lbs): 160 Blood Pressure 143/87 (mmHg): Body Mass Index(BMI): 28 Temperature(F): 97.9 Respiratory Rate 16 (breaths/min): Photos: [1:No Photos] [2:No Photos] [N/A:N/A] Wound Location: [1:Right Calcaneus - Medial] [2:Left Calcaneus - Medial] [N/A:N/A] Wounding Event: [1:Pressure Injury] [2:Pressure Injury] [N/A:N/A] Primary Etiology: [1:Pressure Ulcer] [2:Pressure Ulcer] [N/A:N/A] Comorbid History: [1:Cataracts, Hypertension] [2:Cataracts, Hypertension] [N/A:N/A] Date Acquired: [1:07/02/2016] [2:07/02/2016] [N/A:N/A] Weeks of Treatment: [1:5] [2:5] [N/A:N/A] Wound Status: [1:Open] [2:Open] [N/A:N/A] Measurements L  x W x D 3.4x4.8x0.3 [2:3x4.3x0.3] [N/A:N/A] (cm) Area (cm) : [1:12.818] [2:10.132] [N/A:N/A] Volume (cm) : [1:3.845] [2:3.039] [N/A:N/A] % Reduction in Area: [1:-3.60%] [2:48.80%] [N/A:N/A] % Reduction in Volume: -210.80% [2:-53.60%] [N/A:N/A] Classification: [1:Category/Stage III] [2:Category/Stage III] [N/A:N/A] Exudate Amount: [1:Medium] [2:Large] [N/A:N/A] Exudate Type: [1:Serosanguineous] [2:Serosanguineous] [N/A:N/A] Exudate Color: [1:red, brown] [2:red, brown] [N/A:N/A] Foul Odor After [1:Yes] [2:Yes] [N/A:N/A] Cleansing: Odor Anticipated Due to No [2:No] [N/A:N/A] Product Use: Wound Margin: [1:Flat and Intact] [2:Flat and Intact] [N/A:N/A] Granulation Amount: [1:Small (1-33%)] [2:Small (1-33%)] [N/A:N/A] Granulation Quality: [1:Pink] [2:Pink] [N/A:N/A] Necrotic Amount: [1:Large (67-100%)] [2:Large (67-100%)] [N/A:N/A] Necrotic Tissue: [1:Eschar, Adherent Slough] [2:Eschar] [N/A:N/A] Exposed Structures: [1:Fat: Yes] [2:Fat: Yes] [N/A:N/A] Epithelialization: [1:None]  [2:None] [N/A:N/A] Periwound Skin Texture: Edema: No Edema: No N/A Excoriation: No Excoriation: No Induration: No Induration: No Callus: No Callus: No Crepitus: No Crepitus: No Fluctuance: No Fluctuance: No Friable: No Friable: No Rash: No Rash: No Scarring: No Scarring: No Periwound Skin Moist: Yes Moist: Yes N/A Moisture: Maceration: No Maceration: No Dry/Scaly: No Dry/Scaly: No Periwound Skin Color: Atrophie Blanche: No Atrophie Blanche: No N/A Cyanosis: No Cyanosis: No Ecchymosis: No Ecchymosis: No Erythema: No Erythema: No Hemosiderin Staining: No Hemosiderin Staining: No Mottled: No Mottled: No Pallor: No Pallor: No Rubor: No Rubor: No Temperature: No Abnormality No Abnormality N/A Tenderness on Yes Yes N/A Palpation: Wound Preparation: Ulcer Cleansing: Ulcer Cleansing: N/A Rinsed/Irrigated with Rinsed/Irrigated with Saline Saline Topical Anesthetic Topical Anesthetic Applied: Other: lidocaine Applied: Other: Lidocaine 4% 4% Treatment Notes Electronic Signature(s) Signed: 10/15/2016 5:28:47 PM By: Regan Lemming BSN, RN Entered By: Regan Lemming on 10/15/2016 12:58:45 [name] ([id number]) -------------------------------------------------------------------------------- [place] Details Patient Name: [name] Date of Service: 10/15/2016 12:45 PM Medical Record Number: [id number] Patient Account Number: [id number] Date of Birth/Sex: 05/14/1931 (82 y.o. Female) Treating RN: Afful, RN, BSN, Velva Harman Primary Care Physician: [name] Other Clinician: Referring Physician: [name] Treating Physician/Extender: Frann Rider in Treatment: 5 Active Inactive Abuse / Safety / Falls / Self Care Management Nursing Diagnoses: Impaired physical mobility Potential for falls Goals: Patient will remain injury free Date Initiated: 09/06/2016 Goal Status: Active Interventions: Assess fall risk on admission and  as needed Assess self care needs on admission and as needed Notes: Necrotic Tissue Nursing Diagnoses: Impaired tissue integrity related to necrotic/devitalized tissue Goals: Necrotic/devitalized tissue will be minimized in the wound bed Date Initiated: 09/06/2016 Goal Status: Active Interventions: Assess patient pain level pre-, during and post procedure and prior to discharge Notes: Orientation to the Wound Care Program Nursing Diagnoses: Knowledge deficit related to the wound healing center program Goals: [name], [name] ([id number]) Patient/caregiver will verbalize understanding of the [facility] Date Initiated: 09/06/2016 Goal Status: Active Interventions: Provide education on orientation to the wound center Notes: Pressure Nursing Diagnoses: Knowledge deficit related to management of pressures ulcers Potential for impaired tissue integrity related to pressure, friction, moisture, and shear Goals: Patient will remain free of pressure ulcers Date Initiated: 09/06/2016 Goal Status: Active Interventions: Assess: immobility, friction, shearing, incontinence upon admission and as needed Notes: Soft Tissue Infection Nursing Diagnoses: Impaired tissue integrity Potential for infection: soft tissue Goals: Patient will remain free of wound infection Date Initiated: 09/06/2016 Goal Status: Active Interventions: Assess signs and symptoms of infection every visit Notes: Wound/Skin Impairment Nursing Diagnoses: Impaired tissue integrity Goals: Ulcer/skin breakdown will heal within 14 weeks [name], [name] ([id number]) Date Initiated: 09/06/2016 Goal Status: Active Interventions: Assess ulceration(s) every visit Notes: Electronic Signature(s) Signed: 10/15/2016 5:28:47 PM By: Regan Lemming  BSN, RN Entered By: Regan Lemming on 10/15/2016 12:58:35 [name]  ([id number]) -------------------------------------------------------------------------------- Pain Assessment Details Patient Name: MALAYNA, NOORI. Date of Service: 10/15/2016 12:45 PM Medical Record Number: [id number] Patient Account Number: [id number] Date of Birth/Sex: 20-Nov-1930 (82 y.o. Female) Treating RN: Baruch Gouty, RN, BSN, Velva Harman Primary Care Physician: [name] Other Clinician: Referring Physician: [name] Treating Physician/Extender: Frann Rider in Treatment: 5 Active Problems Location of Pain Severity and Description of Pain Patient Has Paino No Site Locations With Dressing Change: No Pain Management and Medication Current Pain Management: Electronic Signature(s) Signed: 10/15/2016 5:28:47 PM By: Regan Lemming BSN, RN Entered By: Regan Lemming on 10/15/2016 12:41:12 [name] ([id number]) -------------------------------------------------------------------------------- Patient/Caregiver Education Details Patient Name: [name]. Date of Service: 10/15/2016 12:45 PM Medical Record Number: [id number] Patient Account Number: [id number] Date of Birth/Gender: 1931-07-31 (82 y.o. Female) Treating RN: Baruch Gouty, RN, BSN, Velva Harman Primary Care Physician: [name] Other Clinician: Referring Physician: [name] Treating Physician/Extender: Frann Rider in Treatment: 5 Education Assessment Education Provided To: Patient Education Topics Provided Welcome To [facility]: Methods: Explain/Verbal Responses: State content correctly Wound Debridement: Methods: Explain/Verbal Responses: State content correctly Wound/Skin Impairment: Methods: Explain/Verbal Responses: State content correctly Electronic Signature(s) Signed: 10/15/2016 5:28:47 PM By: Regan Lemming BSN, RN Entered By: Regan Lemming on 10/15/2016 13:22:41 [name]  ([id number]) -------------------------------------------------------------------------------- Wound Assessment Details Patient Name: [name]. Date of Service: 10/15/2016 12:45 PM Medical Record Number: [id number] Patient Account Number: [id number] Date of Birth/Sex: 1931-05-13 (82 y.o. Female) Treating RN: Afful, RN, BSN, [place] Primary Care Physician: [name] Other Clinician: Referring Physician: [name] Treating Physician/Extender: Frann Rider in Treatment: 5 Wound Status Wound Number: 1 Primary Etiology: Pressure Ulcer Wound Location: Right Calcaneus - Medial Wound Status: Open Wounding Event: Pressure Injury Comorbid History: Cataracts, Hypertension Date Acquired: 07/02/2016 Weeks Of Treatment: 5 Clustered Wound: No Photos Photo Uploaded By: Regan Lemming on 10/15/2016 17:25:52 Wound Measurements Length: (cm) 3.4 Width: (cm) 4.8 Depth: (cm) 0.3 Area: (cm) 12.818 Volume: (cm) 3.845 % Reduction in Area: -3.6% % Reduction in Volume: -210.8% Epithelialization: None Tunneling: No Undermining: No Wound Description Classification: Category/Stage III Wound Margin: Flat and Intact Exudate Amount: Medium Exudate Type: Serosanguineous Exudate Color: red, brown [name], BERTELSON. ([id number]) Foul Odor After Cleansing: Yes Due to Product Use: No Wound Bed Granulation Amount: Small (1-33%) Exposed Structure Granulation Quality: Pink Fat Layer Exposed: Yes Necrotic Amount: Large (67-100%) Necrotic Quality: Eschar, Adherent Slough Periwound Skin Texture Texture Color No Abnormalities Noted: No No Abnormalities Noted: No Callus: No Atrophie Blanche: No Crepitus: No Cyanosis: No Excoriation: No Ecchymosis: No Fluctuance: No Erythema: No Friable: No Hemosiderin Staining: No Induration: No Mottled: No Localized Edema: No Pallor: No Rash: No Rubor: No Scarring: No Temperature / Pain Moisture Temperature: No Abnormality No  Abnormalities Noted: No Tenderness on Palpation: Yes Dry / Scaly: No Maceration: No Moist: Yes Wound Preparation Ulcer Cleansing: Rinsed/Irrigated with Saline Topical Anesthetic Applied: Other: lidocaine 4%, Treatment Notes Wound #1 (Right, Medial Calcaneus) 1. Cleansed with: Clean wound with Normal Saline 2. Anesthetic Topical Lidocaine 4% cream to wound bed prior to debridement 4. Dressing Applied: Santyl Ointment 5. Secondary Dressing Applied Kerlix/Conform 7. Secured with Tape Notes heel cops; sage boots Electronic Signature(s) Signed: 10/15/2016 5:28:47 PM By: Regan Lemming BSN, RN [name], [name] ([id number]) Entered By: Regan Lemming on 10/15/2016 12:50:58 [name] ([id number]) -------------------------------------------------------------------------------- Wound Assessment Details Patient Name: [name]. Date of Service: 10/15/2016 12:45 PM Medical Record  Number: [id number] Patient Account Number: [id number] Date of Birth/Sex: 16-Jan-1931 (82 y.o. Female) Treating RN: Afful, RN, BSN, [place] Primary Care Physician: [name] Other Clinician: Referring Physician: [name] Treating Physician/Extender: Frann Rider in Treatment: 5 Wound Status Wound Number: 2 Primary Etiology: Pressure Ulcer Wound Location: Left Calcaneus - Medial Wound Status: Open Wounding Event: Pressure Injury Comorbid History: Cataracts, Hypertension Date Acquired: 07/02/2016 Weeks Of Treatment: 5 Clustered Wound: No Photos Photo Uploaded By: Regan Lemming on 10/15/2016 17:25:52 Wound Measurements Length: (cm) 3 Width: (cm) 4.3 Depth: (cm) 0.3 Area: (cm) 10.132 Volume: (cm) 3.039 % Reduction in Area: 48.8% % Reduction in Volume: -53.6% Epithelialization: None Tunneling: No Undermining: No Wound Description Classification: Category/Stage III Wound Margin: Flat and Intact Exudate Amount: Large Exudate Type: Serosanguineous Exudate Color: red,  brown [name], Danielle S. ([id number]) Foul Odor After Cleansing: Yes Due to Product Use: No Wound Bed Granulation Amount: Small (1-33%) Exposed Structure Granulation Quality: Pink Fat Layer Exposed: Yes Necrotic Amount: Large (67-100%) Necrotic Quality: Eschar Periwound Skin Texture Texture Color No Abnormalities Noted: No No Abnormalities Noted: No Callus: No Atrophie Blanche: No Crepitus: No Cyanosis: No Excoriation: No Ecchymosis: No Fluctuance: No Erythema: No Friable: No Hemosiderin Staining: No Induration: No Mottled: No Localized Edema: No Pallor: No Rash: No Rubor: No Scarring: No Temperature / Pain Moisture Temperature: No Abnormality No Abnormalities Noted: No Tenderness on Palpation: Yes Dry / Scaly: No Maceration: No Moist: Yes Wound Preparation Ulcer Cleansing: Rinsed/Irrigated with Saline Topical Anesthetic Applied: Other: Lidocaine 4%, Treatment Notes Wound #2 (Left, Medial Calcaneus) 1. Cleansed with: Clean wound with Normal Saline 2. Anesthetic Topical Lidocaine 4% cream to wound bed prior to debridement 4. Dressing Applied: Santyl Ointment 5. Secondary Dressing Applied Kerlix/Conform 7. Secured with Tape Notes heel cops; sage boots Electronic Signature(s) Signed: 10/15/2016 5:28:47 PM By: Regan Lemming BSN, RN [name], [name] ([id number]) Entered By: Regan Lemming on 10/15/2016 12:51:13 [name] ([id number]) -------------------------------------------------------------------------------- Vitals Details Patient Name: [name] Date of Service: 10/15/2016 12:45 PM Medical Record Number: [id number] Patient Account Number: [id number] Date of Birth/Sex: Jan 01, 1931 (82 y.o. Female) Treating RN: Afful, RN, BSN, Velva Harman Primary Care Physician: [name] Other Clinician: Referring Physician: [name] Treating Physician/Extender: Frann Rider in Treatment: 5 Vital Signs Time Taken: 12:47 Temperature (F):  97.9 Height (in): 63 Pulse (bpm): 72 Weight (lbs): 160 Respiratory Rate (breaths/min): 16 Body Mass Index (BMI): 28.3 Blood Pressure (mmHg): 143/87 Reference Range: 80 - 120 mg / dl Electronic Signature(s) Signed: 10/15/2016 5:28:47 PM By: Regan Lemming BSN, RN Entered By: Regan Lemming on 10/15/2016 12:47:29

## 2016-10-16 NOTE — Progress Notes (Signed)
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 [id number] 11042 - DEB SUBQ TISSUE 20 SQ CM/< ICD-10 Description Diagnosis L89.620 Pressure ulcer of left heel, unstageable L89.610 Pressure ulcer of right heel, unstageable 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 Modifier: ot with fat la Quantity: 1 yer exposed CPT4 Code Description: [id number] 11045 - DEB SUBQ TISS EA ADDL 20CM ICD-10 Description Diagnosis L89.620 Pressure ulcer of left heel, unstageable L89.610 Pressure ulcer of right heel, unstageable 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 Modifier: ot with fat la Quantity: 1 yer exposed Physician Procedures CPT4 Code Description: [id number] 11042 - WC PHYS SUBQ TISS 20 SQ CM ICD-10 Description Diagnosis L89.620 Pressure ulcer of left heel, unstageable L89.610 Pressure ulcer of right heel, unstageable 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o Modifier: t with fat lay Quantity: 1 er exposed CPT4 Code Description: [id number] 11045 - WC PHYS SUBQ  TISS EA ADDL 20 CM Description BOWEN, GOYAL ([id number]) Modifier: Quantity: 1 Electronic Signature(s) Signed: 10/15/2016 1:12:13  PM By: Christin Fudge MD, FACS Entered By: Christin Fudge on 10/15/2016 13:12:13

## 2016-10-20 ENCOUNTER — Ambulatory Visit: Payer: Medicare HMO | Admitting: Urology

## 2016-10-22 ENCOUNTER — Ambulatory Visit: Payer: Medicare HMO | Admitting: Urology

## 2016-10-29 ENCOUNTER — Ambulatory Visit: Payer: Medicare HMO

## 2016-10-29 ENCOUNTER — Ambulatory Visit: Admission: RE | Admit: 2016-10-29 | Payer: Medicare HMO | Source: Ambulatory Visit

## 2016-11-01 ENCOUNTER — Encounter: Payer: Medicare HMO | Attending: Surgery | Admitting: Surgery

## 2016-11-01 DIAGNOSIS — D649 Anemia, unspecified: Secondary | ICD-10-CM | POA: Insufficient documentation

## 2016-11-01 DIAGNOSIS — L89620 Pressure ulcer of left heel, unstageable: Secondary | ICD-10-CM | POA: Insufficient documentation

## 2016-11-01 DIAGNOSIS — L97512 Non-pressure chronic ulcer of other part of right foot with fat layer exposed: Secondary | ICD-10-CM | POA: Insufficient documentation

## 2016-11-01 DIAGNOSIS — I129 Hypertensive chronic kidney disease with stage 1 through stage 4 chronic kidney disease, or unspecified chronic kidney disease: Secondary | ICD-10-CM | POA: Diagnosis not present

## 2016-11-01 DIAGNOSIS — Z993 Dependence on wheelchair: Secondary | ICD-10-CM | POA: Insufficient documentation

## 2016-11-01 DIAGNOSIS — N189 Chronic kidney disease, unspecified: Secondary | ICD-10-CM | POA: Diagnosis not present

## 2016-11-01 DIAGNOSIS — L89610 Pressure ulcer of right heel, unstageable: Secondary | ICD-10-CM | POA: Diagnosis not present

## 2016-11-02 NOTE — Progress Notes (Signed)
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 Left,Medial Calcaneus: Dry Gauze Conform/Kerlix Other - heel cups Dressing Change Frequency: Wound #1 Right,Medial Calcaneus: Change dressing every day. - by [facility] or trained caregiver. Three times weekly - HHRN Wound #2 Left,Medial Calcaneus: [name], [name]. ([id number]) Change dressing every day. - by [facility] or trained caregiver. Three times weekly - HHRN Follow-up Appointments: Wound #1 Right,Medial Calcaneus: Return Appointment in 1 week. Wound #2 Left,Medial Calcaneus: Return Appointment in 1 week. Off-Loading: Wound #1 Right,Medial Calcaneus: Other: - float heels when in bed; keep pressure off during the day. Sage Boots at night. Wound #2 Left,Medial Calcaneus: Other: - float heels when in bed; keep pressure off during the day. Sage Boots at night. Home Health: Wound #1 Right,Medial Calcaneus: Continue Home Health Visits - [place]  Nurse may visit PRN to address  patient s wound care needs. FACE TO FACE ENCOUNTER: MEDICARE and MEDICAID PATIENTS: I certify that this patient is under my care and that I had a face-to-face encounter that meets the physician face-to-face encounter requirements with this patient on this date. The encounter with the patient was in whole or in part for the following MEDICAL CONDITION: (primary reason for [place]) MEDICAL NECESSITY: I certify, that based on my findings, NURSING services are a medically necessary home health service. HOME BOUND STATUS: I certify that my clinical findings support that this patient is homebound (i.e., Due to illness or injury, pt requires aid of supportive devices such as crutches, cane, wheelchairs, walkers, the use of special transportation or the assistance of another person to leave their place of residence. There is a normal inability to leave the home and doing so requires considerable and taxing effort. Other absences are for medical reasons / religious services and are infrequent or of short duration when for other reasons). If current dressing causes regression in wound condition, may D/C ordered dressing product/s and apply Normal Saline Moist Dressing daily until next [place] / Other MD appointment. [place] of regression in wound condition at [phone]. Please direct any NON-WOUND related issues/requests for orders to patient's Primary Care Physician Wound #2 Left,Medial Calcaneus: [place] Visits - [place] Nurse may visit PRN to address patient s wound care needs. FACE TO FACE ENCOUNTER: MEDICARE and MEDICAID PATIENTS: I certify that this patient is under my care and that I had a face-to-face encounter that meets the physician face-to-face encounter requirements with this patient on this date. The encounter with the patient was in whole or in part for the following MEDICAL CONDITION:  (primary reason for [place]) MEDICAL NECESSITY: I certify, that based on my findings, NURSING services are a medically necessary home health service. HOME BOUND STATUS: I certify that my clinical findings support that this patient is homebound (i.e., Due to illness or injury, pt requires aid of supportive devices such as crutches, cane, wheelchairs, walkers, the use of special transportation or the assistance of another person to leave their place of residence. There is a normal inability to leave the home and doing so requires considerable and taxing effort. Other absences are for medical reasons / religious services and are infrequent or of short duration when for other reasons). If current dressing causes regression in wound condition, may D/C ordered dressing product/s and apply Normal Saline Moist Dressing daily until next [place] / Other MD appointment. [place] of regression in wound condition at [phone]. Please direct any NON-WOUND related issues/requests for orders to patient's Primary Care Physician General Notes: *****Please address patient's urine***** [name] ([id number]) I have recommended: 1. Santyl ointment to both heels, heel cups and offloading with Sage boots. 2. Silver alginate to be placed between her toes on the right foot. 3. Adequate proteins, vitamin A, vitamin C and zinc. 4. also asked her to talk to her urologist or PCP regarding regular flushing of her suprapubic catheter. Electronic Signature(s) Signed: 11/01/2016 3:44:30 PM By: Christin Fudge MD, FACS Previous Signature: 11/01/2016 1:43:00 PM Version By: Christin Fudge MD, FACS Entered By: Christin Fudge on 11/01/2016 15:44:30 [name] ([id number]) -------------------------------------------------------------------------------- SuperBill Details Patient Name: [name] Date of Service: 11/01/2016 Medical Record Number: [id number] Patient Account Number:  [id number] Date of Birth/Sex: 07-14-1931 (83 y.o. Female) Treating RN: Cornell Barman Primary Care Physician: [name] Other Clinician: Referring  Physician: [name] Treating Physician/Extender: Frann Rider in Treatment: 8 Diagnosis Coding ICD-10 Codes Code Description [phone] Pressure ulcer of left heel, unstageable L89.610 Pressure ulcer of right heel, unstageable L97.512 Non-pressure chronic ulcer of other part of right foot with fat layer exposed Z99.3 Dependence on wheelchair Facility Procedures CPT4 Code Description: 75732256 11042 - DEB SUBQ TISSUE 20 SQ CM/< ICD-10 Description Diagnosis L89.620 Pressure ulcer of left heel, unstageable L89.610 Pressure ulcer of right heel, unstageable 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 Z99.3 Dependence on wheelchair Modifier: ot with fat la Quantity: 1 yer exposed Physician Procedures CPT4 Code Description: 7209198 11042 - WC PHYS SUBQ TISS 20 SQ CM ICD-10 Description Diagnosis L89.620 Pressure ulcer of left heel, unstageable L89.610 Pressure ulcer of right heel, unstageable L97.512 Non-pressure chronic ulcer of other part of right fo  Z99.3 Dependence on wheelchair Modifier: ot with fat lay Quantity: 1 er exposed Electronic Signature(s) Signed: 11/01/2016 3:44:41 PM By: Christin Fudge MD, FACS Previous Signature: 11/01/2016 1:43:10 PM Version By: Christin Fudge MD, FACS Entered By: Christin Fudge on 11/01/2016 15:44:41

## 2016-11-02 NOTE — Progress Notes (Addendum)
Danielle, Zuniga ([id number]) Visit Report for 11/01/2016 Arrival Information Details Patient Name: [name], [name]. Date of Service: 11/01/2016 12:30 PM Medical Record Number: [id number] Patient Account Number: [id number] Date of Birth/Sex: 1931/06/04 (83 y.o. Female) Treating RN: Carolyne Fiscal, Debi Primary Care Physician: [name] Other Clinician: Referring Physician: [name] Treating Physician/Extender: Frann Rider in Treatment: 8 Visit Information History Since Last Visit All ordered tests and consults were completed: No Patient Arrived: Wheel Chair Added or deleted any medications: No Arrival Time: 12:35 Any new allergies or adverse reactions: No Accompanied By: self Had a fall or experienced change in No activities of daily living that may affect Transfer Assistance: Harrel Lemon Lift risk of falls: Patient Identification Verified: Yes Signs or symptoms of abuse/neglect since last No Secondary Verification Process Yes visito Completed: Hospitalized since last visit: No Patient Requires Transmission-Based No Has Dressing in Place as Prescribed: Yes Precautions: Pain Present Now: No Patient Has Alerts: No Electronic Signature(s) Signed: 11/02/2016 10:45:15 AM By: Alric Quan Entered By: Alric Quan on 11/01/2016 12:35:30 [name] ([id number]) -------------------------------------------------------------------------------- Encounter Discharge Information Details Patient Name: [name]. Date of Service: 11/01/2016 12:30 PM Medical Record Number: [id number] Patient Account Number: [id number] Date of Birth/Sex: 02/03/1931 (83 y.o. Female) Treating RN: Carolyne Fiscal, Debi Primary Care Blayton Huttner: [name] Other Clinician: Referring Allysen Lazo: [name] Treating Marcell Pfeifer/Extender: Frann Rider in Treatment: 8 Encounter Discharge Information Items Discharge Pain Level: 0 Discharge Condition: Stable Ambulatory Status:  Wheelchair Discharge Destination: Home Transportation: Other Accompanied By: self Schedule Follow-up Appointment: Yes Medication Reconciliation completed and provided to Patient/Care Yes Desira Alessandrini: Provided on Clinical Summary of Care: 11/08/2016 Form Type Recipient Paper Patient EB Electronic Signature(s) Signed: 02/15/2017 4:43:15 PM By: Alric Quan Previous Signature: 11/02/2016 10:45:15 AM Version By: Alric Quan Previous Signature: 11/01/2016 1:32:42 PM Version By: Sharon Mt Entered By: Alric Quan on 11/11/2016 14:19:02 [name] ([id number]) -------------------------------------------------------------------------------- Lower Extremity Assessment Details Patient Name: [name]. Date of Service: 11/01/2016 12:30 PM Medical Record Number: [id number] Patient Account Number: [id number] Date of Birth/Sex: [birth year] (83 y.o. Female) Treating RN: Carolyne Fiscal, Debi Primary Care Physician: [name] Other Clinician: Referring Physician: [name] Treating Physician/Extender: Frann Rider in Treatment: 8 Vascular Assessment Pulses: Dorsalis Pedis Palpable: [Left:Yes] [Right:Yes] Posterior Tibial Extremity colors, hair growth, and conditions: Extremity Color: [Left:Dusky] [Right:Dusky] Temperature of Extremity: [Left:Warm] [Right:Warm] Capillary Refill: [Left:< 3 seconds] [Right:< 3 seconds] Toe Nail Assessment Left: Right: Thick: Yes Yes Discolored: Yes Yes Deformed: Yes Yes Improper Length and Hygiene: Yes Yes Electronic Signature(s) Signed: 11/02/2016 10:45:15 AM By: Alric Quan Entered By: Alric Quan on 11/01/2016 12:58:20 [name] ([id number]) -------------------------------------------------------------------------------- Multi Wound Chart Details Patient Name: [name]. Date of Service: 11/01/2016 12:30 PM Medical Record Number: [id number] Patient Account Number: [id number] Date of Birth/Sex:  03-15-1931 (83 y.o. Female) Treating RN: Carolyne Fiscal, Debi Primary Care Physician: [name] Other Clinician: Referring Physician: [name] Treating Physician/Extender: Frann Rider in Treatment: 8 Vital Signs Height(in): 63 Pulse(bpm): 68 Weight(lbs): 160 Blood Pressure 156/64 (mmHg): Body Mass Index(BMI): 28 Temperature(F): 98.2 Respiratory Rate 16 (breaths/min): Photos: [N/A:N/A] Wound Location: Right Calcaneus - Medial Left Calcaneus - Medial N/A Wounding Event: Pressure Injury Pressure Injury N/A Primary Etiology: Pressure Ulcer Pressure Ulcer N/A Comorbid History: Cataracts, Hypertension Cataracts, Hypertension N/A Date Acquired: 07/02/2016 07/02/2016 N/A Weeks of Treatment: 8 8 N/A Wound Status: Open Open N/A Measurements L x W x D 3x3x0.3 3.5x4x0.4 N/A (cm) Area (cm) : 7.069 10.996 N/A Volume (cm) : 2.121 4.398 N/A %  Reduction in Area: 42.90% 44.40% N/A % Reduction in Volume: -71.50% -122.20% N/A Starting Position 1 7 (o'clock): Ending Position 1 12 (o'clock): Maximum Distance 1 0.6 (cm): Undermining: No Yes N/A Classification: Category/Stage III Category/Stage III N/A Exudate Amount: Large Large N/A Exudate Type: Serosanguineous Serosanguineous N/A JACARRA, BOBAK. ([id number]) Exudate Color: red, brown red, brown N/A Foul Odor After Yes Yes N/A Cleansing: Odor Anticipated Due to No No N/A Product Use: Wound Margin: Flat and Intact Flat and Intact N/A Granulation Amount: Medium (34-66%) Medium (34-66%) N/A Granulation Quality: Pink Pink N/A Necrotic Amount: Medium (34-66%) Medium (34-66%) N/A Necrotic Tissue: Eschar, Adherent Slough Eschar N/A Exposed Structures: Fat: Yes Fat: Yes N/A Epithelialization: None None N/A Debridement: Debridement (40981- Debridement (19147- N/A 11047) 11047) Pre-procedure 13:03 13:03 N/A Verification/Time Out Taken: Pain Control: Lidocaine 4% Topical Lidocaine 4% Topical N/A Solution  Solution Tissue Debrided: Fibrin/Slough, Exudates, Fibrin/Slough, Exudates, N/A Subcutaneous Subcutaneous Level: Skin/Subcutaneous Skin/Subcutaneous N/A Tissue Tissue Debridement Area (sq 9 14 N/A cm): Instrument: Forceps, Scissors Forceps, Scissors N/A Bleeding: Minimum Moderate N/A Hemostasis Achieved: Pressure Silver Nitrate N/A Procedural Pain: 0 0 N/A Post Procedural Pain: 0 0 N/A Debridement Treatment Procedure was tolerated Procedure was tolerated N/A Response: well well Post Debridement 3x3x0.3 3.6x4.2x0.5 N/A Measurements L x W x D (cm) Post Debridement 2.121 5.938 N/A Volume: (cm) Post Debridement Category/Stage III Category/Stage III N/A Stage: Periwound Skin Texture: Edema: No Edema: No N/A Excoriation: No Excoriation: No Induration: No Induration: No Callus: No Callus: No Crepitus: No Crepitus: No Fluctuance: No Fluctuance: No Friable: No Friable: No Rash: No Rash: No Scarring: No Scarring: No N/A [place], Donis S. ([id number]) Periwound Skin Moist: Yes Moist: Yes Moisture: Maceration: No Maceration: No Dry/Scaly: No Dry/Scaly: No Periwound Skin Color: Atrophie Blanche: No Atrophie Blanche: No N/A Cyanosis: No Cyanosis: No Ecchymosis: No Ecchymosis: No Erythema: No Erythema: No Hemosiderin Staining: No Hemosiderin Staining: No Mottled: No Mottled: No Pallor: No Pallor: No Rubor: No Rubor: No Temperature: No Abnormality No Abnormality N/A Tenderness on Yes Yes N/A Palpation: Wound Preparation: Ulcer Cleansing: Ulcer Cleansing: N/A Rinsed/Irrigated with Rinsed/Irrigated with Saline Saline Topical Anesthetic Topical Anesthetic Applied: Other: lidocaine Applied: Other: Lidocaine 4% 4% Procedures Performed: Debridement Debridement N/A Treatment Notes Electronic Signature(s) Signed: 11/01/2016 1:40:16 PM By: Christin Fudge MD, FACS Entered By: Christin Fudge on 11/01/2016 13:40:16 [name]  ([id number]) -------------------------------------------------------------------------------- [place] Details Patient Name: [name]. Date of Service: 11/01/2016 12:30 PM Medical Record Number: [id number] Patient Account Number: [id number] Date of Birth/Sex: 1931-08-27 (83 y.o. Female) Treating RN: Carolyne Fiscal, Debi Primary Care Physician: [name] Other Clinician: Referring Physician: [name] Treating Physician/Extender: Frann Rider in Treatment: 8 Active Inactive Abuse / Safety / Falls / Self Care Management Nursing Diagnoses: Impaired physical mobility Potential for falls Goals: Patient will remain injury free Date Initiated: 09/06/2016 Goal Status: Active Interventions: Assess fall risk on admission and as needed Assess self care needs on admission and as needed Notes: Necrotic Tissue Nursing Diagnoses: Impaired tissue integrity related to necrotic/devitalized tissue Goals: Necrotic/devitalized tissue will be minimized in the wound bed Date Initiated: 09/06/2016 Goal Status: Active Interventions: Assess patient pain level pre-, during and post procedure and prior to discharge Notes: Orientation to the Wound Care Program Nursing Diagnoses: Knowledge deficit related to the wound healing center program Goals: PESSY, DELAMAR ([id number]) Patient/caregiver will verbalize understanding of the [place] Program Date Initiated: 09/06/2016 Goal Status: Active Interventions: Provide education on orientation to the wound center Notes: Pressure Nursing Diagnoses:  Knowledge deficit related to management of pressures ulcers Potential for impaired tissue integrity related to pressure, friction, moisture, and shear Goals: Patient will remain free of pressure ulcers Date Initiated: 09/06/2016 Goal Status: Active Interventions: Assess: immobility, friction, shearing, incontinence upon admission and as  needed Notes: Soft Tissue Infection Nursing Diagnoses: Impaired tissue integrity Potential for infection: soft tissue Goals: Patient will remain free of wound infection Date Initiated: 09/06/2016 Goal Status: Active Interventions: Assess signs and symptoms of infection every visit Notes: Wound/Skin Impairment Nursing Diagnoses: Impaired tissue integrity Goals: Ulcer/skin breakdown will heal within 14 weeks EVELINE, SAUVE ([id number]) Date Initiated: 09/06/2016 Goal Status: Active Interventions: Assess ulceration(s) every visit Notes: Electronic Signature(s) Signed: 11/02/2016 10:45:15 AM By: Alric Quan Entered By: Alric Quan on 11/01/2016 12:58:24 [name] ([id number]) -------------------------------------------------------------------------------- Pain Assessment Details Patient Name: [name]. Date of Service: 11/01/2016 12:30 PM Medical Record Number: [id number] Patient Account Number: [id number] Date of Birth/Sex: Jun 21, 1931 (83 y.o. Female) Treating RN: Carolyne Fiscal, Debi Primary Care Physician: [name] Other Clinician: Referring Physician: [name] Treating Physician/Extender: Frann Rider in Treatment: 8 Active Problems Location of Pain Severity and Description of Pain Patient Has Paino No Site Locations With Dressing Change: No Pain Management and Medication Current Pain Management: Electronic Signature(s) Signed: 11/02/2016 10:45:15 AM By: Alric Quan Entered By: Alric Quan on 11/01/2016 12:35:40 [name] ([id number]) -------------------------------------------------------------------------------- Patient/Caregiver Education Details Patient Name: [name]. Date of Service: 11/01/2016 12:30 PM Medical Record Number: [id number] Patient Account Number: [id number] Date of Birth/Gender: 06-11-1931 (83 y.o. Female) Treating RN: Carolyne Fiscal, Debi Primary Care Physician: [name] Other  Clinician: Referring Physician: [name] Treating Physician/Extender: Frann Rider in Treatment: 8 Education Assessment Education Provided To: Patient Education Topics Provided Wound/Skin Impairment: Handouts: Other: change dressing as ordered Methods: Demonstration, Explain/Verbal Responses: State content correctly Electronic Signature(s) Signed: 02/15/2017 4:43:15 PM By: Alric Quan Previous Signature: 11/02/2016 10:45:15 AM Version By: Alric Quan Entered By: Alric Quan on 11/11/2016 14:19:08 [name] ([id number]) -------------------------------------------------------------------------------- Wound Assessment Details Patient Name: [name]. Date of Service: 11/01/2016 12:30 PM Medical Record Number: [id number] Patient Account Number: [id number] Date of Birth/Sex: 1931/03/27 (83 y.o. Female) Treating RN: Carolyne Fiscal, Debi Primary Care Physician: [name] Other Clinician: Referring Physician: [name] Treating Physician/Extender: Frann Rider in Treatment: 8 Wound Status Wound Number: 1 Primary Etiology: Pressure Ulcer Wound Location: Right Calcaneus - Medial Wound Status: Open Wounding Event: Pressure Injury Comorbid History: Cataracts, Hypertension Date Acquired: 07/02/2016 Weeks Of Treatment: 8 Clustered Wound: No Photos Photo Uploaded By: Alric Quan on 11/01/2016 13:01:17 Wound Measurements Length: (cm) 3 Width: (cm) 3 Depth: (cm) 0.3 Area: (cm) 7.069 Volume: (cm) 2.121 % Reduction in Area: 42.9% % Reduction in Volume: -71.5% Epithelialization: None Tunneling: No Undermining: No Wound Description Classification: Category/Stage III Wound Margin: Flat and Intact Exudate Amount: Large Exudate Type: Serosanguineous Exudate Color: red, brown Foul Odor After Cleansing: Yes Due to Product Use: No Wound Bed Granulation Amount: Medium (34-66%) Exposed Structure Granulation Quality:  Pink Fat Layer Exposed: Yes Necrotic Amount: Medium (34-66%) Necrotic Quality: Eschar, Adherent [address] [place]. ([id number]) Periwound Skin Texture Texture Color No Abnormalities Noted: No No Abnormalities Noted: No Callus: No Atrophie Blanche: No Crepitus: No Cyanosis: No Excoriation: No Ecchymosis: No Fluctuance: No Erythema: No Friable: No Hemosiderin Staining: No Induration: No Mottled: No Localized Edema: No Pallor: No Rash: No Rubor: No Scarring: No Temperature / Pain Moisture Temperature: No Abnormality No Abnormalities Noted: No Tenderness on Palpation: Yes Dry / Scaly:  No Maceration: No Moist: Yes Wound Preparation Ulcer Cleansing: Rinsed/Irrigated with Saline Topical Anesthetic Applied: Other: lidocaine 4%, Electronic Signature(s) Signed: 11/02/2016 10:45:15 AM By: Alric Quan Entered By: Alric Quan on 11/01/2016 12:56:36 [name] ([id number]) -------------------------------------------------------------------------------- Wound Assessment Details Patient Name: [name]. Date of Service: 11/01/2016 12:30 PM Medical Record Number: [id number] Patient Account Number: [id number] Date of Birth/Sex: Aug 13, 1931 (83 y.o. Female) Treating RN: Carolyne Fiscal, Debi Primary Care Physician: [name] Other Clinician: Referring Physician: [name] Treating Physician/Extender: Frann Rider in Treatment: 8 Wound Status Wound Number: 2 Primary Etiology: Pressure Ulcer Wound Location: Left Calcaneus - Medial Wound Status: Open Wounding Event: Pressure Injury Comorbid History: Cataracts, Hypertension Date Acquired: 07/02/2016 Weeks Of Treatment: 8 Clustered Wound: No Photos Photo Uploaded By: Alric Quan on 11/01/2016 13:01:17 Wound Measurements Length: (cm) 3.5 Width: (cm) 4 Depth: (cm) 0.4 Area: (cm) 10.996 Volume: (cm) 4.398 % Reduction in Area: 44.4% % Reduction in Volume: -122.2% Epithelialization:  None Tunneling: No Undermining: Yes Starting Position (o'clock): 7 Ending Position (o'clock): 12 Maximum Distance: (cm) 0.6 Wound Description Classification: Category/Stage III Wound Margin: Flat and Intact Exudate Amount: Large Exudate Type: Serosanguineous Exudate Color: red, brown Foul Odor After Cleansing: Yes Due to Product Use: No Wound Bed FRANZISKA, PODGURSKI. ([id number]) Granulation Amount: Medium (34-66%) Exposed Structure Granulation Quality: Pink Fat Layer Exposed: Yes Necrotic Amount: Medium (34-66%) Necrotic Quality: Eschar Periwound Skin Texture Texture Color No Abnormalities Noted: No No Abnormalities Noted: No Callus: No Atrophie Blanche: No Crepitus: No Cyanosis: No Excoriation: No Ecchymosis: No Fluctuance: No Erythema: No Friable: No Hemosiderin Staining: No Induration: No Mottled: No Localized Edema: No Pallor: No Rash: No Rubor: No Scarring: No Temperature / Pain Moisture Temperature: No Abnormality No Abnormalities Noted: No Tenderness on Palpation: Yes Dry / Scaly: No Maceration: No Moist: Yes Wound Preparation Ulcer Cleansing: Rinsed/Irrigated with Saline Topical Anesthetic Applied: Other: Lidocaine 4%, Electronic Signature(s) Signed: 11/02/2016 10:45:15 AM By: Alric Quan Entered By: Alric Quan on 11/01/2016 12:57:25 [name] ([id number]) -------------------------------------------------------------------------------- Vitals Details Patient Name: [name] Date of Service: 11/01/2016 12:30 PM Medical Record Number: [id number] Patient Account Number: [id number] Date of Birth/Sex: 04-14-1931 (83 y.o. Female) Treating RN: Carolyne Fiscal, Debi Primary Care Physician: [name] Other Clinician: Referring Physician: [name] Treating Physician/Extender: Frann Rider in Treatment: 8 Vital Signs Time Taken: 12:43 Temperature (F): 98.2 Height (in): 63 Pulse (bpm): 68 Weight (lbs):  160 Respiratory Rate (breaths/min): 16 Body Mass Index (BMI): 28.3 Blood Pressure (mmHg): 156/64 Reference Range: 80 - 120 mg / dl Electronic Signature(s) Signed: 11/02/2016 10:45:15 AM By: Alric Quan Entered By: Alric Quan on 11/01/2016 12:44:38

## 2016-11-05 ENCOUNTER — Ambulatory Visit: Admission: RE | Admit: 2016-11-05 | Payer: Medicare HMO | Source: Ambulatory Visit

## 2016-11-08 ENCOUNTER — Ambulatory Visit: Payer: Medicare HMO | Admitting: Surgery

## 2016-11-11 ENCOUNTER — Encounter: Payer: Medicare HMO | Admitting: Surgery

## 2016-11-11 DIAGNOSIS — L89620 Pressure ulcer of left heel, unstageable: Secondary | ICD-10-CM | POA: Diagnosis not present

## 2016-11-12 ENCOUNTER — Ambulatory Visit
Admission: RE | Admit: 2016-11-12 | Discharge: 2016-11-12 | Disposition: A | Discharge status: Home / Self Care | Payer: Medicare HMO | Source: Ambulatory Visit | Attending: Urology | Admitting: Urology

## 2016-11-12 NOTE — OR Nursing (Signed)
Pt called since didn't arrive for procedure at 9am as scheduled. She said she was so confused and needed to reschedule. She wasn't clear about what happened this am. She was encouraged to call her physician and report her confusion and reschedule tube exchange to another day.

## 2016-11-12 NOTE — Progress Notes (Signed)
ESMA, KILTS ([id number]) Visit Report for 11/11/2016 Arrival Information Details Patient Name: [name], [name]. Date of Service: 11/11/2016 1:30 PM Medical Record Number: [id number] Patient Account Number: [id number] Date of Birth/Sex: 03/10/1931 (83 y.o. Female) Treating RN: Carolyne Fiscal, Debi Primary Care Sundiata Ferrick: [name] Other Clinician: Referring Audrey Eller: [name] Treating Gyneth Hubka/Extender: Frann Rider in Treatment: 9 Visit Information History Since Last Visit All ordered tests and consults were completed: No Patient Arrived: Wheel Chair Added or deleted any medications: No Arrival Time: 13:27 Any new allergies or adverse reactions: No Accompanied By: self Had a fall or experienced change in No activities of daily living that may affect Transfer Assistance: Harrel Lemon Lift risk of falls: Patient Identification Verified: Yes Signs or symptoms of abuse/neglect since last No Secondary Verification Process Yes visito Completed: Hospitalized since last visit: No Patient Requires Transmission-Based No Has Dressing in Place as Prescribed: Yes Precautions: Pain Present Now: No Patient Has Alerts: No Electronic Signature(s) Signed: 11/11/2016 3:37:28 PM By: Alric Quan Entered By: Alric Quan on 11/11/2016 13:34:56 [name] ([id number]) -------------------------------------------------------------------------------- Encounter Discharge Information Details Patient Name: [name]. Date of Service: 11/11/2016 1:30 PM Medical Record Number: [id number] Patient Account Number: [id number] Date of Birth/Sex: 01-11-1931 (83 y.o. Female) Treating RN: Carolyne Fiscal, Debi Primary Care Samit Sylve: [name] Other Clinician: Referring Azaryah Heathcock: [name] Treating Tovah Slavick/Extender: Frann Rider in Treatment: 9 Encounter Discharge Information Items Discharge Pain Level: 0 Discharge Condition: Stable Ambulatory Status:  Wheelchair Discharge Destination: Home Transportation: Private Auto Accompanied By: self Schedule Follow-up Appointment: Yes Medication Reconciliation completed and provided to Patient/Care Yes Excell Neyland: Provided on Clinical Summary of Care: 11/11/2016 Form Type Recipient Paper Patient EB Electronic Signature(s) Signed: 11/11/2016 3:37:28 PM By: Alric Quan Previous Signature: 11/11/2016 2:13:16 PM Version By: Ruthine Dose Entered By: Alric Quan on 11/11/2016 14:52:57 [name] ([id number]) -------------------------------------------------------------------------------- Lower Extremity Assessment Details Patient Name: [name]. Date of Service: 11/11/2016 1:30 PM Medical Record Number: [id number] Patient Account Number: [id number] Date of Birth/Sex: 1930-12-15 (83 y.o. Female) Treating RN: Carolyne Fiscal, Debi Primary Care Shawntavia Saunders: [name] Other Clinician: Referring Damiano Stamper: [name] Treating Angelica Wix/Extender: Frann Rider in Treatment: 9 Vascular Assessment Pulses: Dorsalis Pedis Palpable: [Left:Yes] [Right:Yes] Posterior Tibial Extremity colors, hair growth, and conditions: Extremity Color: [Left:Dusky] [Right:Dusky] Temperature of Extremity: [Left:Warm] [Right:Warm] Capillary Refill: [Left:< 3 seconds] [Right:< 3 seconds] Toe Nail Assessment Left: Right: Thick: Yes Yes Discolored: Yes Yes Deformed: Yes Yes Improper Length and Hygiene: Yes Yes Electronic Signature(s) Signed: 11/11/2016 3:37:28 PM By: Alric Quan Entered By: Alric Quan on 11/11/2016 13:39:13 Seitzinger, [name] ([id number]) -------------------------------------------------------------------------------- Multi Wound Chart Details Patient Name: [name]. Date of Service: 11/11/2016 1:30 PM Medical Record Number: [id number] Patient Account Number: [id number] Date of Birth/Sex: 1930-11-12 (83 y.o. Female) Treating RN: Carolyne Fiscal, Debi Primary Care  Jearlean Demauro: [name] Other Clinician: Referring Nacole Fluhr: [name] Treating Shaymus Eveleth/Extender: Frann Rider in Treatment: 9 Vital Signs Height(in): 63 Pulse(bpm): 67 Weight(lbs): 160 Blood Pressure 156/60 (mmHg): Body Mass Index(BMI): 28 Temperature(F): 98.0 Respiratory Rate 16 (breaths/min): Photos: [1:No Photos] [2:No Photos] [N/A:N/A] Wound Location: [1:Right Calcaneus - Medial] [2:Left Calcaneus - Medial] [N/A:N/A] Wounding Event: [1:Pressure Injury] [2:Pressure Injury] [N/A:N/A] Primary Etiology: [1:Pressure Ulcer] [2:Pressure Ulcer] [N/A:N/A] Comorbid History: [1:Cataracts, Hypertension] [2:Cataracts, Hypertension] [N/A:N/A] Date Acquired: [1:07/02/2016] [2:07/02/2016] [N/A:N/A] Weeks of Treatment: [1:9] [2:9] [N/A:N/A] Wound Status: [1:Open] [2:Open] [N/A:N/A] Measurements L x W x D 2.5x3.5x0.3 [2:3x3.8x0.4] [N/A:N/A] (cm) Area (cm) : [1:6.872] [2:8.954] [N/A:N/A] Volume (cm) : [1:2.062] [2:3.581] [N/A:N/A] % Reduction in Area: [1:44.40%] [  2:54.80%] [N/A:N/A] % Reduction in Volume: -66.70% [2:-80.90%] [N/A:N/A] Starting Position 1 [2:6] (o'clock): Ending Position 1 [2:5] (o'clock): Maximum Distance 1 [2:0.8] (cm): Undermining: [1:No] [2:Yes] [N/A:N/A] Classification: [1:Category/Stage III] [2:Category/Stage III] [N/A:N/A] Exudate Amount: [1:Large] [2:Large] [N/A:N/A] Exudate Type: [1:Serosanguineous] [2:Serosanguineous] [N/A:N/A] Exudate Color: [1:red, brown] [2:red, brown] [N/A:N/A] Foul Odor After [1:Yes] [2:Yes] [N/A:N/A] Cleansing: Odor Anticipated Due to No [2:No] [N/A:N/A] Product Use: Wound Margin: [1:Flat and Intact] [2:Flat and Intact] [N/A:N/A] Granulation Amount: Medium (34-66%) Medium (34-66%) N/A Granulation Quality: Pink Pink N/A Necrotic Amount: Medium (34-66%) Medium (34-66%) N/A Necrotic Tissue: Eschar, Adherent Slough Eschar, Adherent Slough N/A Exposed Structures: Fat Layer (Subcutaneous Fat Layer (Subcutaneous  N/A Tissue) Exposed: Yes Tissue) Exposed: Yes Epithelialization: None None N/A Debridement: Debridement (81448- Debridement (18563- N/A 11047) 11047) Pre-procedure 13:52 13:52 N/A Verification/Time Out Taken: Pain Control: Lidocaine 4% Topical Lidocaine 4% Topical N/A Solution Solution Tissue Debrided: Fibrin/Slough, Exudates, Fibrin/Slough, Exudates, N/A Subcutaneous Subcutaneous Level: Skin/Subcutaneous Skin/Subcutaneous N/A Tissue Tissue Debridement Area (sq 8.75 11.4 N/A cm): Instrument: Zuniga, Forceps Zuniga, Forceps N/A Bleeding: Minimum Minimum N/A Hemostasis Achieved: Pressure Pressure N/A Procedural Pain: 0 0 N/A Post Procedural Pain: 0 0 N/A Debridement Treatment Procedure was tolerated Procedure was tolerated N/A Response: well well Post Debridement 2.5x3.5x0.3 3x3.8x0.4 N/A Measurements L x W x D (cm) Post Debridement 2.062 3.581 N/A Volume: (cm) Post Debridement Category/Stage III Category/Stage III N/A Stage: Periwound Skin Texture: Excoriation: No Excoriation: No N/A Induration: No Induration: No Callus: No Callus: No Crepitus: No Crepitus: No Rash: No Rash: No Scarring: No Scarring: No Periwound Skin Maceration: No Maceration: No N/A Moisture: Dry/Scaly: No Dry/Scaly: No Periwound Skin Color: Atrophie Blanche: No Atrophie Blanche: No N/A Cyanosis: No Cyanosis: No Ecchymosis: No Ecchymosis: No Erythema: No Erythema: No Hemosiderin Staining: No Hemosiderin Staining: No Mottled: No Mottled: No Pallor: No Pallor: No Rubor: No Rubor: No Amano, Meesha S. ([id number]) Temperature: No Abnormality No Abnormality N/A Tenderness on Yes Yes N/A Palpation: Wound Preparation: Ulcer Cleansing: Ulcer Cleansing: N/A Rinsed/Irrigated with Rinsed/Irrigated with Saline Saline Topical Anesthetic Topical Anesthetic Applied: Other: lidocaine Applied: Other: Lidocaine 4% 4% Procedures Performed: Debridement Debridement N/A Treatment Notes Wound #1  (Right, Medial Calcaneus) 1. Cleansed with: Clean wound with Normal Saline 2. Anesthetic Topical Lidocaine 4% cream to wound bed prior to debridement 4. Dressing Applied: Santyl Ointment 5. Secondary Dressing Applied ABD Pad Dry Gauze Kerlix/Conform Notes coban, heel cups Wound #2 (Left, Medial Calcaneus) 1. Cleansed with: Clean wound with Normal Saline 2. Anesthetic Topical Lidocaine 4% cream to wound bed prior to debridement 4. Dressing Applied: Santyl Ointment 5. Secondary Dressing Applied ABD Pad Dry Gauze Kerlix/Conform Notes coban, heel cups Electronic Signature(s) Signed: 11/11/2016 2:28:15 PM By: Christin Fudge MD, FACS Entered By: Christin Fudge on 11/11/2016 14:28:15 BOBBYJO, MARULANDA ([name] ([id number]) -------------------------------------------------------------------------------- Pain Assessment Details Patient Name: [name]. Date of Service: 11/11/2016 1:30 PM Medical Record Number: [id number] Patient Account Number: [id number] Date of Birth/Sex: Jan 28, 1931 (83 y.o. Female) Treating RN: Carolyne Fiscal, Debi Primary Care Nicoletta Hush: [name] Other Clinician: Referring Aaidyn San: [name] Treating Sole Lengacher/Extender: Frann Rider in Treatment: 9 Active Problems Location of Pain Severity and Description of Pain Patient Has Paino No Site Locations With Dressing Change: No Pain Management and Medication Current Pain Management: Electronic Signature(s) Signed: 11/11/2016 3:37:28 PM By: Alric Quan Entered By: Alric Quan on 11/11/2016 13:35:05 [name] ([id number]) -------------------------------------------------------------------------------- Patient/Caregiver Education Details Patient Name: [name] Date of Service: 11/11/2016 1:30 PM Medical Record Number: [id number] Patient Account Number:  [id number] Date of Birth/Gender: 1931/10/17 (83 y.o. Female) Treating RN: Carolyne Fiscal, Debi Primary Care  Physician: [name] Other Clinician: Referring Physician: [name] Treating Physician/Extender: Frann Rider in Treatment: 9 Education Assessment Education Provided To: Patient Education Topics Provided Wound/Skin Impairment: Handouts: Other: change dressing as ordered Methods: Demonstration, Explain/Verbal Responses: State content correctly Electronic Signature(s) Signed: 11/11/2016 3:37:28 PM By: Alric Quan Entered By: Alric Quan on 11/11/2016 14:53:12 [place], [name] ([id number]) -------------------------------------------------------------------------------- Wound Assessment Details Patient Name: [name]. Date of Service: 11/11/2016 1:30 PM Medical Record Number: [id number] Patient Account Number: [id number] Date of Birth/Sex: 05-07-1931 (83 y.o. Female) Treating RN: Carolyne Fiscal, Debi Primary Care Velma Agnes: [name] Other Clinician: Referring Jakaree Pickard: [name] Treating Deshaun Weisinger/Extender: Frann Rider in Treatment: 9 Wound Status Wound Number: 1 Primary Etiology: Pressure Ulcer Wound Location: Right Calcaneus - Medial Wound Status: Open Wounding Event: Pressure Injury Comorbid History: Cataracts, Hypertension Date Acquired: 07/02/2016 Weeks Of Treatment: 9 Clustered Wound: No Photos Photo Uploaded By: Gretta Cool, RN, BSN, Kim on 11/11/2016 15:24:37 Wound Measurements Length: (cm) 2.5 Width: (cm) 3.5 Depth: (cm) 0.3 Area: (cm) 6.872 Volume: (cm) 2.062 % Reduction in Area: 44.4% % Reduction in Volume: -66.7% Epithelialization: None Tunneling: No Undermining: No Wound Description Classification: Category/Stage III Wound Margin: Flat and Intact Exudate Amount: Large Exudate Type: Serosanguineous Exudate Color: red, brown Foul Odor After Cleansing: Yes Due to Product Use: No Wound Bed Granulation Amount: Medium (34-66%) Exposed Structure Granulation Quality: Pink Fat Layer (Subcutaneous Tissue)  Exposed: Yes Necrotic Amount: Medium (34-66%) Necrotic Quality: Eschar, Adherent [address], Iridessa S. ([id number]) Periwound Skin Texture Texture Color No Abnormalities Noted: No No Abnormalities Noted: No Callus: No Atrophie Blanche: No Crepitus: No Cyanosis: No Excoriation: No Ecchymosis: No Induration: No Erythema: No Rash: No Hemosiderin Staining: No Scarring: No Mottled: No Pallor: No Moisture Rubor: No No Abnormalities Noted: No Dry / Scaly: No Temperature / Pain Maceration: No Temperature: No Abnormality Tenderness on Palpation: Yes Wound Preparation Ulcer Cleansing: Rinsed/Irrigated with Saline Topical Anesthetic Applied: Other: lidocaine 4%, Treatment Notes Wound #1 (Right, Medial Calcaneus) 1. Cleansed with: Clean wound with Normal Saline 2. Anesthetic Topical Lidocaine 4% cream to wound bed prior to debridement 4. Dressing Applied: Santyl Ointment 5. Secondary Dressing Applied ABD Pad Dry Gauze Kerlix/Conform Notes coban, heel cups Electronic Signature(s) Signed: 11/11/2016 3:37:28 PM By: Alric Quan Entered By: Alric Quan on 11/11/2016 13:48:28 Bohanon, [name] ([id number]) -------------------------------------------------------------------------------- Wound Assessment Details Patient Name: [name]. Date of Service: 11/11/2016 1:30 PM Medical Record Number: [id number] Patient Account Number: [id number] Date of Birth/Sex: 1930-11-07 (83 y.o. Female) Treating RN: Carolyne Fiscal, Debi Primary Care Aaleyah Witherow: [name] Other Clinician: Referring Providencia Hottenstein: [name] Treating Elva Breaker/Extender: Frann Rider in Treatment: 9 Wound Status Wound Number: 2 Primary Etiology: Pressure Ulcer Wound Location: Left Calcaneus - Medial Wound Status: Open Wounding Event: Pressure Injury Comorbid History: Cataracts, Hypertension Date Acquired: 07/02/2016 Weeks Of Treatment: 9 Clustered Wound: No Photos Photo Uploaded By:  Gretta Cool, RN, BSN, Kim on 11/11/2016 15:24:54 Wound Measurements Length: (cm) 3 Width: (cm) 3.8 Depth: (cm) 0.4 Area: (cm) 8.954 Volume: (cm) 3.581 % Reduction in Area: 54.8% % Reduction in Volume: -80.9% Epithelialization: None Tunneling: No Undermining: Yes Starting Position (o'clock): 6 Ending Position (o'clock): 5 Maximum Distance: (cm) 0.8 Wound Description Classification: Category/Stage III Foul Odor Afte Wound Margin: Flat and Intact Due to Product Exudate Amount: Large Slough/Fibrino Exudate Type: Serosanguineous Exudate Color: red, brown r Cleansing: Yes Use: No Yes Wound Bed Umholtz, Josceline S. ([id number]) Granulation Amount: Medium (  34-66%) Exposed Structure Granulation Quality: Pink Fat Layer (Subcutaneous Tissue) Exposed: Yes Necrotic Amount: Medium (34-66%) Necrotic Quality: Eschar, Adherent Slough Periwound Skin Texture Texture Color No Abnormalities Noted: No No Abnormalities Noted: No Callus: No Atrophie Blanche: No Crepitus: No Cyanosis: No Excoriation: No Ecchymosis: No Induration: No Erythema: No Rash: No Hemosiderin Staining: No Scarring: No Mottled: No Pallor: No Moisture Rubor: No No Abnormalities Noted: No Dry / Scaly: No Temperature / Pain Maceration: No Temperature: No Abnormality Tenderness on Palpation: Yes Wound Preparation Ulcer Cleansing: Rinsed/Irrigated with Saline Topical Anesthetic Applied: Other: Lidocaine 4%, Treatment Notes Wound #2 (Left, Medial Calcaneus) 1. Cleansed with: Clean wound with Normal Saline 2. Anesthetic Topical Lidocaine 4% cream to wound bed prior to debridement 4. Dressing Applied: Santyl Ointment 5. Secondary Dressing Applied ABD Pad Dry Gauze Kerlix/Conform Notes coban, heel cups Electronic Signature(s) Signed: 11/11/2016 3:37:28 PM By: Alric Quan Entered By: Alric Quan on 11/11/2016 13:47:04 Gruver, [name]  ([id number]) -------------------------------------------------------------------------------- [place] Details Patient Name: [name] Date of Service: 11/11/2016 1:30 PM Medical Record Number: [id number] Patient Account Number: [id number] Date of Birth/Sex: 1930-12-24 (83 y.o. Female) Treating RN: Carolyne Fiscal, Debi Primary Care Yovani Cogburn: [name] Other Clinician: Referring Delwyn Scoggin: [name] Treating Koleton Duchemin/Extender: Frann Rider in Treatment: 9 Vital Signs Time Taken: 13:35 Temperature (F): 98.0 Height (in): 63 Pulse (bpm): 67 Weight (lbs): 160 Respiratory Rate (breaths/min): 16 Body Mass Index (BMI): 28.3 Blood Pressure (mmHg): 156/60 Reference Range: 80 - 120 mg / dl Electronic Signature(s) Signed: 11/11/2016 3:37:28 PM By: Alric Quan Entered By: Alric Quan on 11/11/2016 13:35:49

## 2016-11-12 NOTE — Progress Notes (Signed)
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 Appointment in 1 week. Off-Loading: Wound #1 Right,Medial Calcaneus: Other: - float heels when in bed; keep pressure off during the day. Sage Boots at night. Wound #2 Left,Medial Calcaneus: Other: - float heels when in bed; keep pressure off during the day. Sage Boots at night. Home Health: Wound #1 Right,Medial Calcaneus: Continue Home Health Visits - [place] Nurse may visit PRN to address patient s wound care needs. FACE TO FACE ENCOUNTER: MEDICARE and MEDICAID PATIENTS: I certify that this patient is under my care and that I had a face-to-face encounter  that meets the physician face-to-face encounter requirements with this patient on this date. The encounter with the patient was in whole or in part  for the following MEDICAL CONDITION: (primary reason for [place]) MEDICAL NECESSITY: I certify, that based on my findings, NURSING services are a medically necessary home health service. HOME BOUND STATUS: I certify that my clinical findings support that this patient is homebound (i.e., Due to illness or injury, pt requires aid of supportive devices such as crutches, cane, wheelchairs, walkers, the use of special transportation or the assistance of another person to leave their place of residence. There is a normal inability to leave the home and doing so requires considerable and taxing effort. Other absences are for medical reasons / religious services and are infrequent or of short duration when for other reasons). If current dressing causes regression in wound condition, may D/C ordered dressing product/s and apply Normal Saline Moist Dressing daily until next [place] / Other MD appointment. [place] of regression in wound condition at [phone]. Please direct any NON-WOUND related issues/requests for orders to patient's Primary Care Physician Wound #2 Left,Medial Calcaneus: [place] Visits - [place] Nurse may visit PRN to address patient s wound care needs. FACE TO FACE ENCOUNTER: MEDICARE and MEDICAID PATIENTS: I certify that this patient is under my care and that I had a face-to-face encounter that meets the physician face-to-face encounter requirements with this patient on this date. The encounter with the patient was in whole or in part for the following MEDICAL CONDITION: (primary reason for [place]) MEDICAL NECESSITY: I certify, that based on my findings, NURSING services are a medically necessary home health service. HOME BOUND STATUS: I certify that my clinical findings support that this patient is homebound (i.e., Due to illness or injury, pt requires aid of supportive devices such as crutches, cane,  wheelchairs, walkers, the use of special transportation or the assistance of another person to leave their place of residence. There is a normal inability to leave the home and doing so requires considerable and taxing effort. Other absences are for medical reasons / religious services and are infrequent or of short duration when for other reasons). If current dressing causes regression in wound condition, may D/C ordered dressing product/s and apply Normal Saline Moist Dressing daily until next [place] / Other MD appointment. [place] of regression in wound condition at [phone]. Please direct any NON-WOUND related issues/requests for orders to patient's Primary Care Physician [name], [name] ([id number]) I have recommended: 1. Santyl ointment to both heels, heel cups and offloading with Sage boots. 2. Silver alginate to be placed between her toes on the right foot. 3. Adequate proteins, vitamin A, vitamin C and zinc. 4. also asked her to talk to her urologist or PCP regarding regular flushing of her suprapubic catheter. Electronic Signature(s) Signed: 11/11/2016 2:30:04 PM By: Christin Fudge MD, FACS Entered By: Christin Fudge on 11/11/2016 14:30:04 [name] ([id number]) -------------------------------------------------------------------------------- SuperBill Details Patient Name: [name] Date of Service: 11/11/2016 Medical Record Number: [id number] Patient Account Number: [id number] Date of Birth/Sex: 18-Aug-1931 (83 y.o. 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, unstageable L89.610 Pressure ulcer of right heel, unstageable L97.512 Non-pressure chronic ulcer of other part of right foot with fat layer  exposed  Z99.3 Dependence on wheelchair Facility Procedures CPT4 Code Description: 06269485 11042 - DEB SUBQ TISSUE 20 SQ CM/< ICD-10 Description Diagnosis L89.620 Pressure ulcer of left heel, unstageable L89.610 Pressure ulcer of right heel, unstageable 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 Modifier: ot with fat la Quantity: 1 yer exposed CPT4 Code Description: 46270350 11045 - DEB SUBQ TISS EA ADDL 20CM ICD-10 Description Diagnosis L89.620 Pressure ulcer of left heel, unstageable L89.610 Pressure ulcer of right heel, unstageable 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 Modifier: ot with fat la Quantity: 1 yer exposed Physician Procedures CPT4 Code Description: 0938182 11042 - WC PHYS SUBQ TISS 20 SQ CM ICD-10 Description Diagnosis L89.620 Pressure ulcer of left heel, unstageable L89.610 Pressure ulcer of right heel, unstageable 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o Modifier: t with fat lay Quantity: 1 er exposed CPT4 Code Description: 9937169 11045 - WC PHYS SUBQ TISS EA ADDL 20 CM Description [name], [name] ([id number]) Modifier: Quantity: 1 Electronic Signature(s) Signed: 11/11/2016 2:34:55 PM By: Christin Fudge MD, FACS Entered By: Christin Fudge on 11/11/2016 14:34:54

## 2016-11-15 ENCOUNTER — Ambulatory Visit: Payer: Medicare HMO | Admitting: Surgery

## 2016-11-18 ENCOUNTER — Encounter: Payer: Medicare HMO | Attending: Surgery | Admitting: Surgery

## 2016-11-18 ENCOUNTER — Other Ambulatory Visit: Payer: Self-pay | Admitting: Physician Assistant

## 2016-11-18 DIAGNOSIS — N189 Chronic kidney disease, unspecified: Secondary | ICD-10-CM | POA: Diagnosis not present

## 2016-11-18 DIAGNOSIS — Z993 Dependence on wheelchair: Secondary | ICD-10-CM | POA: Insufficient documentation

## 2016-11-18 DIAGNOSIS — D649 Anemia, unspecified: Secondary | ICD-10-CM | POA: Insufficient documentation

## 2016-11-18 DIAGNOSIS — L89610 Pressure ulcer of right heel, unstageable: Secondary | ICD-10-CM | POA: Insufficient documentation

## 2016-11-18 DIAGNOSIS — L89620 Pressure ulcer of left heel, unstageable: Secondary | ICD-10-CM | POA: Insufficient documentation

## 2016-11-18 DIAGNOSIS — L97512 Non-pressure chronic ulcer of other part of right foot with fat layer exposed: Secondary | ICD-10-CM | POA: Insufficient documentation

## 2016-11-18 DIAGNOSIS — I129 Hypertensive chronic kidney disease with stage 1 through stage 4 chronic kidney disease, or unspecified chronic kidney disease: Secondary | ICD-10-CM | POA: Diagnosis not present

## 2016-11-19 ENCOUNTER — Encounter: Payer: Self-pay | Admitting: Diagnostic Radiology

## 2016-11-19 ENCOUNTER — Ambulatory Visit
Admission: RE | Admit: 2016-11-19 | Discharge: 2016-11-19 | Disposition: A | Discharge status: Home / Self Care | Payer: Medicare HMO | Source: Ambulatory Visit | Attending: Urology | Admitting: Urology

## 2016-11-19 DIAGNOSIS — R339 Retention of urine, unspecified: Secondary | ICD-10-CM | POA: Diagnosis not present

## 2016-11-19 DIAGNOSIS — Z435 Encounter for attention to cystostomy: Secondary | ICD-10-CM | POA: Insufficient documentation

## 2016-11-19 HISTORY — PX: IR GENERIC HISTORICAL: IMG1180011

## 2016-11-19 MED ORDER — IOHEXOL 300 MG/ML  SOLN
10.0000 mL | Freq: Once | INTRAMUSCULAR | Status: AC | PRN
  Administered 2016-11-19: 10 mL via INTRATHECAL

## 2016-11-19 MED ORDER — LIDOCAINE HCL (PF) 1 % IJ SOLN
INTRAMUSCULAR | Status: DC | PRN
  Administered 2016-11-19: 3 mL

## 2016-11-19 MED ORDER — SODIUM CHLORIDE 0.9 % IV SOLN: INTRAVENOUS | Status: DC

## 2016-11-19 NOTE — Procedures (Signed)
Suprapubic catheter was successfully exchanged with fluoroscopy.  New 16 French catheter placed.  No immediate complication.  No bleeding.

## 2016-11-19 NOTE — Progress Notes (Addendum)
[name], [name] ([id number]) Visit Report for 11/18/2016 Arrival Information Details Patient Name: [name], [name]. Date of Service: 11/18/2016 12:45 PM Medical Record Number: [id number] Patient Account Number: [id number] Date of Birth/Sex: 1931/01/05 (83 y.o. Female) Treating RN: Afful, RN, BSN, Velva Harman Primary Care Yashvi Jasinski: [name] Other Clinician: Referring Symia Herdt: [name] Treating Jennamarie Goings/Extender: Frann Rider in Treatment: 10 Visit Information History Since Last Visit All ordered tests and consults were completed: No Patient Arrived: Wheel Chair Added or deleted any medications: No Arrival Time: 12:48 Any new allergies or adverse reactions: No Accompanied By: son Had a fall or experienced change in No activities of daily living that may affect Transfer Assistance: Harrel Lemon Lift risk of falls: Patient Identification Verified: Yes Signs or symptoms of abuse/neglect since last No Secondary Verification Process Yes visito Completed: Hospitalized since last visit: No Patient Requires Transmission-Based No Has Dressing in Place as Prescribed: Yes Precautions: Pain Present Now: No Patient Has Alerts: No Electronic Signature(s) Signed: 11/18/2016 3:37:28 PM By: Regan Lemming BSN, RN Entered By: Regan Lemming on 11/18/2016 12:48:40 [name] ([id number]) -------------------------------------------------------------------------------- Encounter Discharge Information Details Patient Name: [name]. Date of Service: 11/18/2016 12:45 PM Medical Record Number: [id number] Patient Account Number: [id number] Date of Birth/Sex: 04-02-1931 (83 y.o. Female) Treating RN: Afful, RN, BSN, Velva Harman Primary Care Miqueas Whilden: [name] Other Clinician: Referring Alessio Bogan: [name] Treating Carlotta Telfair/Extender: Frann Rider in Treatment: 10 Encounter Discharge Information Items Discharge Pain Level: 0 Discharge Condition: Stable Ambulatory Status:  Wheelchair Discharge Destination: Home Transportation: Private Auto nephew in Accompanied By: lobby Schedule Follow-up Appointment: No Medication Reconciliation completed No and provided to Patient/Care Lizzy Hamre: Provided on Clinical Summary of Care: 11/18/2016 Form Type Recipient Paper Patient EB Electronic Signature(s) Signed: 12/10/2016 10:04:27 AM By: Regan Lemming BSN, RN Previous Signature: 11/18/2016 1:28:59 PM Version By: Ruthine Dose Entered By: Regan Lemming on 12/10/2016 10:04:26 [name] ([id number]) -------------------------------------------------------------------------------- Lower Extremity Assessment Details Patient Name: [name]. Date of Service: 11/18/2016 12:45 PM Medical Record Number: [id number] Patient Account Number: [id number] Date of Birth/Sex: 10/21/1930 (83 y.o. Female) Treating RN: Afful, RN, BSN, Velva Harman Primary Care Devory Mckinzie: [name] Other Clinician: Referring Reise Hietala: [name] Treating Yolande Skoda/Extender: Frann Rider in Treatment: 10 Vascular Assessment Pulses: Dorsalis Pedis Palpable: [Left:Yes] [Right:Yes] Posterior Tibial Extremity colors, hair growth, and conditions: Temperature of Extremity: [Left:Warm] [Right:Warm] Dependent Rubor: [Left:No] [Right:No] Blanched when Elevated: [Left:No] [Right:No] Lipodermatosclerosis: [Left:No] [Right:No] Toe Nail Assessment Left: Right: Thick: Yes Yes Discolored: Yes Yes Deformed: Yes Yes Improper Length and Hygiene: Yes Yes Electronic Signature(s) Signed: 11/18/2016 3:37:28 PM By: Regan Lemming BSN, RN Entered By: Regan Lemming on 11/18/2016 13:01:56 [name] ([id number]) -------------------------------------------------------------------------------- Multi Wound Chart Details Patient Name: [name]. Date of Service: 11/18/2016 12:45 PM Medical Record Number: [id number] Patient Account Number: [id number] Date of Birth/Sex: 1931-05-14 (83 y.o. Female) Treating  RN: Baruch Gouty, RN, BSN, Velva Harman Primary Care Isaiah Cianci: [name] Other Clinician: Referring Simisola Sandles: [name] Treating Navia Lindahl/Extender: Frann Rider in Treatment: 10 Vital Signs Height(in): 63 Pulse(bpm): 65 Weight(lbs): 160 Blood Pressure 133/66 (mmHg): Body Mass Index(BMI): 28 Temperature(F): 98.5 Respiratory Rate 16 (breaths/min): Photos: [1:No Photos] [2:No Photos] [N/A:N/A] Wound Location: [1:Right Calcaneus - Medial] [2:Left Calcaneus - Medial] [N/A:N/A] Wounding Event: [1:Pressure Injury] [2:Pressure Injury] [N/A:N/A] Primary Etiology: [1:Pressure Ulcer] [2:Pressure Ulcer] [N/A:N/A] Comorbid History: [1:Cataracts, Hypertension] [2:Cataracts, Hypertension] [N/A:N/A] Date Acquired: [1:07/02/2016] [2:07/02/2016] [N/A:N/A] Weeks of Treatment: [1:10] [2:10] [N/A:N/A] Wound Status: [1:Open] [2:Open] [N/A:N/A] Measurements L x W x D 3.5x4x0.7 [2:3.2x5.3x0.4] [N/A:N/A] (cm) Area (  cm) : [1:10.996] [2:13.32] [N/A:N/A] Volume (cm) : [1:7.697] [2:5.328] [N/A:N/A] % Reduction in Area: [1:11.10%] [2:32.70%] [N/A:N/A] % Reduction in Volume: -522.20% [2:-169.20%] [N/A:N/A] Starting Position 1 [2:5] (o'clock): Ending Position 1 [2:7] (o'clock): Maximum Distance 1 [2:2] (cm): Undermining: [1:No] [2:Yes] [N/A:N/A] Classification: [1:Category/Stage III] [2:Category/Stage III] [N/A:N/A] Exudate Amount: [1:Large] [2:Large] [N/A:N/A] Exudate Type: [1:Serosanguineous] [2:Serosanguineous] [N/A:N/A] Exudate Color: [1:red, brown] [2:red, brown] [N/A:N/A] Foul Odor After [1:Yes] [2:Yes] [N/A:N/A] Cleansing: Odor Anticipated Due to No [2:No] [N/A:N/A] Product Use: Wound Margin: [1:Flat and Intact] [2:Flat and Intact] [N/A:N/A] Granulation Amount: Medium (34-66%) Medium (34-66%) N/A Granulation Quality: Pink Pink N/A Necrotic Amount: Medium (34-66%) Medium (34-66%) N/A Necrotic Tissue: Eschar, Adherent Slough Eschar, Adherent Slough N/A Exposed Structures: Fat  Layer (Subcutaneous Fat Layer (Subcutaneous N/A Tissue) Exposed: Yes Tissue) Exposed: Yes Epithelialization: None None N/A Debridement: Debridement ([id number]- Debridement (13244- N/A 11047) 11047) Pre-procedure 13:09 13:09 N/A Verification/Time Out Taken: Pain Control: Lidocaine 4% Topical Lidocaine 4% Topical N/A Solution Solution Tissue Debrided: Necrotic/Eschar, Necrotic/Eschar, N/A Fibrin/Slough, Exudates, Fibrin/Slough, Exudates, Subcutaneous Subcutaneous Level: Skin/Subcutaneous Skin/Subcutaneous N/A Tissue Tissue Debridement Area (sq 14 16.96 N/A cm): Instrument: Forceps, Scissors Forceps, Scissors N/A Bleeding: Moderate Moderate N/A Hemostasis Achieved: Pressure Pressure N/A Procedural Pain: 0 0 N/A Post Procedural Pain: 0 0 N/A Debridement Treatment Procedure was tolerated Procedure was tolerated N/A Response: well well Post Debridement 3.5x4x0.7 3.2x5.3x0.4 N/A Measurements L x W x D (cm) Post Debridement 7.697 5.328 N/A Volume: (cm) Post Debridement Category/Stage III Category/Stage III N/A Stage: Periwound Skin Texture: Excoriation: No Excoriation: No N/A Induration: No Induration: No Callus: No Callus: No Crepitus: No Crepitus: No Rash: No Rash: No Scarring: No Scarring: No Periwound Skin Maceration: No Maceration: No N/A Moisture: Dry/Scaly: No Dry/Scaly: No Periwound Skin Color: Atrophie Blanche: No Atrophie Blanche: No N/A Cyanosis: No Cyanosis: No Ecchymosis: No Ecchymosis: No Erythema: No Erythema: No Hemosiderin Staining: No Hemosiderin Staining: No Mottled: No Mottled: No [name], RANDA. ([id number]) Pallor: No Pallor: No Rubor: No Rubor: No Temperature: No Abnormality No Abnormality N/A Tenderness on Yes Yes N/A Palpation: Wound Preparation: Ulcer Cleansing: Ulcer Cleansing: N/A Rinsed/Irrigated with Rinsed/Irrigated with Saline Saline Topical Anesthetic Topical Anesthetic Applied: Other: lidocaine Applied: Other:  Lidocaine 4% 4% Procedures Performed: Debridement Debridement N/A Treatment Notes Electronic Signature(s) Signed: 11/18/2016 1:20:50 PM By: Christin Fudge MD, FACS Entered By: Christin Fudge on 11/18/2016 13:20:50 [name] ([id number]) -------------------------------------------------------------------------------- [place] Details Patient Name: [name]. Date of Service: 11/18/2016 12:45 PM Medical Record Number: [id number] Patient Account Number: [id number] Date of Birth/Sex: 08/14/1931 (83 y.o. Female) Treating RN: Afful, RN, BSN, Velva Harman Primary Care Kadin Bera: [name] Other Clinician: Referring Stein Windhorst: [name] Treating Amor Packard/Extender: Frann Rider in Treatment: 10 Active Inactive ` Abuse / Safety / Falls / Self Care Management Nursing Diagnoses: Impaired physical mobility Potential for falls Goals: Patient will remain injury free Date Initiated: 09/06/2016 Target Resolution Date: 12/02/2016 Goal Status: Active Interventions: Assess fall risk on admission and as needed Assess self care needs on admission and as needed Notes: ` Necrotic Tissue Nursing Diagnoses: Impaired tissue integrity related to necrotic/devitalized tissue Goals: Necrotic/devitalized tissue will be minimized in the wound bed Date Initiated: 09/06/2016 Target Resolution Date: 12/02/2016 Goal Status: Active Interventions: Assess patient pain level pre-, during and post procedure and prior to discharge Notes: ` Orientation to the Wound Care Program Nursing Diagnoses: [name], [name] ([id number]) Knowledge deficit related to the wound healing center program Goals: Patient/caregiver will verbalize understanding of the [facility] Date Initiated: 09/06/2016  Target Resolution Date: 12/02/2016 Goal Status: Active Interventions: Provide education on orientation to the wound center Notes: ` Pressure Nursing Diagnoses: Knowledge  deficit related to management of pressures ulcers Potential for impaired tissue integrity related to pressure, friction, moisture, and shear Goals: Patient will remain free of pressure ulcers Date Initiated: 09/06/2016 Target Resolution Date: 12/02/2016 Goal Status: Active Interventions: Assess: immobility, friction, shearing, incontinence upon admission and as needed Notes: ` Soft Tissue Infection Nursing Diagnoses: Impaired tissue integrity Potential for infection: soft tissue Goals: Patient will remain free of wound infection Date Initiated: 09/06/2016 Target Resolution Date: 12/02/2016 Goal Status: Active Interventions: Assess signs and symptoms of infection every visit Notes: ` Wound/Skin Impairment [name], [name] ([id number]) Nursing Diagnoses: Impaired tissue integrity Goals: Ulcer/skin breakdown will heal within 14 weeks Date Initiated: 09/06/2016 Target Resolution Date: 12/16/2016 Goal Status: Active Interventions: Assess ulceration(s) every visit Notes: Electronic Signature(s) Signed: 11/18/2016 3:37:28 PM By: Regan Lemming BSN, RN Entered By: Regan Lemming on 11/18/2016 13:03:07 [name] ([id number]) -------------------------------------------------------------------------------- Pain Assessment Details Patient Name: [name]. Date of Service: 11/18/2016 12:45 PM Medical Record Number: [id number] Patient Account Number: [id number] Date of Birth/Sex: 02/06/1931 (83 y.o. Female) Treating RN: Afful, RN, BSN, Velva Harman Primary Care Brizeyda Holtmeyer: [name] Other Clinician: Referring Ilisha Blust: [name] Treating Tomas Schamp/Extender: Frann Rider in Treatment: 10 Active Problems Location of Pain Severity and Description of Pain Patient Has Paino No Site Locations With Dressing Change: No Pain Management and Medication Current Pain Management: Electronic Signature(s) Signed: 11/18/2016 3:37:28 PM By: Regan Lemming BSN, RN Entered By: Regan Lemming on  11/18/2016 12:48:47 [name] ([id number]) -------------------------------------------------------------------------------- Patient/Caregiver Education Details Patient Name: [name]. Date of Service: 11/18/2016 12:45 PM Medical Record Number: [id number] Patient Account Number: [id number] Date of Birth/Gender: 10/24/1930 (83 y.o. Female) Treating RN: Baruch Gouty, RN, BSN, Velva Harman Primary Care Physician: [name] Other Clinician: Referring Physician: [name] Treating Physician/Extender: Frann Rider in Treatment: 10 Education Assessment Education Provided To: Patient Education Topics Provided Basic Hygiene: Methods: Explain/Verbal Responses: State content correctly Welcome To [facility]: Methods: Explain/Verbal Responses: State content correctly Wound Debridement: Methods: Explain/Verbal Responses: State content correctly Wound/Skin Impairment: Methods: Explain/Verbal Responses: State content correctly Electronic Signature(s) Signed: 02/15/2017 4:24:13 PM By: Regan Lemming BSN, RN Entered By: Regan Lemming on 12/10/2016 10:06:16 [name] ([id number]) -------------------------------------------------------------------------------- Wound Assessment Details Patient Name: [name]. Date of Service: 11/18/2016 12:45 PM Medical Record Number: [id number] Patient Account Number: [id number] Date of Birth/Sex: [birth year] (83 y.o. Female) Treating RN: Afful, RN, BSN, [place] Primary Care Timberlynn Kizziah: [name] Other Clinician: Referring Amandalee Lacap: [name] Treating Anabeth Chilcott/Extender: Frann Rider in Treatment: 10 Wound Status Wound Number: 1 Primary Etiology: Pressure Ulcer Wound Location: Right Calcaneus - Medial Wound Status: Open Wounding Event: Pressure Injury Comorbid History: Cataracts, Hypertension Date Acquired: 07/02/2016 Weeks Of Treatment: 10 Clustered Wound: No Photos Photo Uploaded By: Regan Lemming on  11/18/2016 15:33:06 Wound Measurements Length: (cm) 3.5 Width: (cm) 4 Depth: (cm) 0.7 Area: (cm) 10.996 Volume: (cm) 7.697 % Reduction in Area: 11.1% % Reduction in Volume: -522.2% Epithelialization: None Tunneling: No Undermining: No Wound Description Classification: Category/Stage III Foul Odor Aft Wound Margin: Flat and Intact Due to Produc Exudate Amount: Large Exudate Type: Serosanguineous Exudate Color: red, brown SUA, [name] ([id number]) er Cleansing: Yes t Use: No Wound Bed Granulation Amount: Medium (34-66%) Exposed Structure Granulation Quality: Pink Fat Layer (Subcutaneous Tissue) Exposed: Yes Necrotic Amount: Medium (34-66%) Necrotic Quality: Eschar, Adherent Slough Periwound Skin Texture Texture Color  No Abnormalities Noted: No No Abnormalities Noted: No Callus: No Atrophie Blanche: No Crepitus: No Cyanosis: No Excoriation: No Ecchymosis: No Induration: No Erythema: No Rash: No Hemosiderin Staining: No Scarring: No Mottled: No Pallor: No Moisture Rubor: No No Abnormalities Noted: No Dry / Scaly: No Temperature / Pain Maceration: No Temperature: No Abnormality Tenderness on Palpation: Yes Wound Preparation Ulcer Cleansing: Rinsed/Irrigated with Saline Topical Anesthetic Applied: Other: lidocaine 4%, Electronic Signature(s) Signed: 11/18/2016 3:37:28 PM By: Regan Lemming BSN, RN Entered By: Regan Lemming on 11/18/2016 12:59:08 [name] ([id number]) -------------------------------------------------------------------------------- Wound Assessment Details Patient Name: [name]. Date of Service: 11/18/2016 12:45 PM Medical Record Number: [id number] Patient Account Number: [id number] Date of Birth/Sex: [birth year] (83 y.o. Female) Treating RN: Afful, RN, BSN, [place] Primary Care Douglas Rooks: [name] Other Clinician: Referring Chidinma Clites: [name] Treating Pookela Sellin/Extender: Frann Rider in Treatment: 10 Wound  Status Wound Number: 2 Primary Etiology: Pressure Ulcer Wound Location: Left Calcaneus - Medial Wound Status: Open Wounding Event: Pressure Injury Comorbid History: Cataracts, Hypertension Date Acquired: 07/02/2016 Weeks Of Treatment: 10 Clustered Wound: No Photos Photo Uploaded By: Regan Lemming on 11/18/2016 15:33:07 Wound Measurements Length: (cm) 3.2 % Reduction in Width: (cm) 5.3 % Reduction in Depth: (cm) 0.4 Epithelializat Area: (cm) 13.32 Undermining: Volume: (cm) 5.328 Starting P Ending Posi Maximum Dis Area: 32.7% Volume: -169.2% ion: None Yes osition (o'clock): 5 tion (o'clock): 7 tance: (cm) 2 Wound Description Classification: Category/Stage III Foul Odor Aft Wound Margin: Flat and Intact Due to Produc [name], [name] ([id number]) er Cleansing: Yes t Use: No Exudate Amount: Large Slough/Fibrino Yes Exudate Type: Serosanguineous Exudate Color: red, brown Wound Bed Granulation Amount: Medium (34-66%) Exposed Structure Granulation Quality: Pink Fat Layer (Subcutaneous Tissue) Exposed: Yes Necrotic Amount: Medium (34-66%) Necrotic Quality: Eschar, Adherent Slough Periwound Skin Texture Texture Color No Abnormalities Noted: No No Abnormalities Noted: No Callus: No Atrophie Blanche: No Crepitus: No Cyanosis: No Excoriation: No Ecchymosis: No Induration: No Erythema: No Rash: No Hemosiderin Staining: No Scarring: No Mottled: No Pallor: No Moisture Rubor: No No Abnormalities Noted: No Dry / Scaly: No Temperature / Pain Maceration: No Temperature: No Abnormality Tenderness on Palpation: Yes Wound Preparation Ulcer Cleansing: Rinsed/Irrigated with Saline Topical Anesthetic Applied: Other: Lidocaine 4%, Electronic Signature(s) Signed: 11/18/2016 3:37:28 PM By: Regan Lemming BSN, RN Entered By: Regan Lemming on 11/18/2016 13:01:05 [name] ([id number]) -------------------------------------------------------------------------------- Vitals  Details Patient Name: [name] Date of Service: 11/18/2016 12:45 PM Medical Record Number: [id number] Patient Account Number: [id number] Date of Birth/Sex: 03-05-1931 (83 y.o. Female) Treating RN: Afful, RN, BSN, [place] Primary Care Megahn Killings: [name] Other Clinician: Referring Selassie Spatafore: [name] Treating Ehsan Corvin/Extender: Frann Rider in Treatment: 10 Vital Signs Time Taken: 12:48 Temperature (F): 98.5 Height (in): 63 Pulse (bpm): 65 Weight (lbs): 160 Respiratory Rate (breaths/min): 16 Body Mass Index (BMI): 28.3 Blood Pressure (mmHg): 133/66 Reference Range: 80 - 120 mg / dl Electronic Signature(s) Signed: 11/18/2016 3:37:28 PM By: Regan Lemming BSN, RN Entered By: Regan Lemming on 11/18/2016 12:49:05

## 2016-11-19 NOTE — Progress Notes (Signed)
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 dressing every day. - by [facility] or trained caregiver. Three times weekly - [place]. ([id number]) Follow-up Appointments: Wound #1 Right,Medial Calcaneus: Return Appointment in 1 week. Wound #2 Left,Medial Calcaneus: Return Appointment in 1 week. Off-Loading: Wound #1 Right,Medial Calcaneus: Other: - float heels when in bed; keep pressure off during the day. Sage Boots at night. Wound #2 Left,Medial Calcaneus: Other: - float heels when in bed; keep pressure off during the day. Sage Boots at night. Home Health: Wound #1 Right,Medial Calcaneus: Continue Home Health Visits - [place] Nurse may visit PRN to address patient s wound care needs. FACE TO FACE ENCOUNTER: MEDICARE and MEDICAID PATIENTS: I certify that this patient  is under my care and that I had a face-to-face encounter that meets the physician face-to-face encounter requirements with this patient on this date. The encounter with the patient was in whole or in  part for the following MEDICAL CONDITION: (primary reason for [place]) MEDICAL NECESSITY: I certify, that based on my findings, NURSING services are a medically necessary home health service. HOME BOUND STATUS: I certify that my clinical findings support that this patient is homebound (i.e., Due to illness or injury, pt requires aid of supportive devices such as crutches, cane, wheelchairs, walkers, the use of special transportation or the assistance of another person to leave their place of residence. There is a normal inability to leave the home and doing so requires considerable and taxing effort. Other absences are for medical reasons / religious services and are infrequent or of short duration when for other reasons). If current dressing causes regression in wound condition, may D/C ordered dressing product/s and apply Normal Saline Moist Dressing daily until next [place] / Other MD appointment. [place] of regression in wound condition at [phone]. Please direct any NON-WOUND related issues/requests for orders to patient's Primary Care Physician Wound #2 Left,Medial Calcaneus: [place] Visits - [place] Nurse may visit PRN to address patient s wound care needs. FACE TO FACE ENCOUNTER: MEDICARE and MEDICAID PATIENTS: I certify that this patient is under my care and that I had a face-to-face encounter that meets the physician face-to-face encounter requirements with this patient on this date. The encounter with the patient was in whole or in part for the following MEDICAL CONDITION: (primary reason for [place]) MEDICAL NECESSITY: I certify, that based on my findings, NURSING services are a medically necessary home health service. HOME BOUND STATUS: I certify that my clinical findings support that this patient is homebound (i.e., Due to illness or injury, pt requires aid of supportive devices such as crutches, cane,  wheelchairs, walkers, the use of special transportation or the assistance of another person to leave their place of residence. There is a normal inability to leave the home and doing so requires considerable and taxing effort. Other absences are for medical reasons / religious services and are infrequent or of short duration when for other reasons). If current dressing causes regression in wound condition, may D/C ordered dressing product/s and apply Normal Saline Moist Dressing daily until next [place] / Other MD appointment. [place] of regression in wound condition at [phone]. Please direct any NON-WOUND related issues/requests for orders to patient's Primary Care Physician [name], [name] ([id number]) I have recommended: 1. Santyl ointment to both heels, heel cups and offloading with Sage boots. 2. Silver alginate to be placed between her toes on the right foot. 3. Adequate proteins, vitamin A, vitamin C and zinc. 4. also asked her to talk to her urologist or PCP regarding regular flushing of her suprapubic catheter -- I understand her appointment is tomorrow. Electronic Signature(s) Signed: 11/18/2016 1:23:54 PM By: Danielle Fudge MD, FACS Entered By: [name] on 11/18/2016 13:23:54 [name] ([id number]) -------------------------------------------------------------------------------- SuperBill Details Patient Name: [name] Date of Service: 11/18/2016 Medical Record Number: [id number] Patient Account Number: [id number] Date of Birth/Sex: Oct 17, 1931 (83 y.o. 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 Facility Procedures CPT4 Code Description: [id number] 11042 - DEB SUBQ TISSUE 20 SQ CM/< ICD-10 Description Diagnosis L89.620 Pressure ulcer of left heel, unstageable L97.512 Non-pressure chronic ulcer of other part of right fo L89.610 Pressure ulcer of right heel,  unstageable 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

## 2016-11-25 ENCOUNTER — Ambulatory Visit: Payer: Medicare HMO | Admitting: Surgery

## 2016-11-26 ENCOUNTER — Ambulatory Visit: Payer: Medicare HMO

## 2016-11-26 ENCOUNTER — Ambulatory Visit: Payer: Medicare HMO | Admitting: Surgery

## 2016-11-29 ENCOUNTER — Ambulatory Visit
Admission: RE | Admit: 2016-11-29 | Discharge: 2016-11-29 | Disposition: A | Discharge status: Home / Self Care | Payer: Medicare HMO | Source: Ambulatory Visit | Attending: Urology | Admitting: Urology

## 2016-11-29 ENCOUNTER — Other Ambulatory Visit: Payer: Self-pay | Admitting: Urology

## 2016-11-29 ENCOUNTER — Encounter: Payer: Self-pay | Admitting: Interventional Radiology

## 2016-11-29 ENCOUNTER — Telehealth: Payer: Self-pay

## 2016-11-29 DIAGNOSIS — R339 Retention of urine, unspecified: Secondary | ICD-10-CM

## 2016-11-29 DIAGNOSIS — Z435 Encounter for attention to cystostomy: Secondary | ICD-10-CM | POA: Insufficient documentation

## 2016-11-29 HISTORY — PX: IR GENERIC HISTORICAL: IMG1180011

## 2016-11-29 MED ORDER — IOPAMIDOL (ISOVUE-300) INJECTION 61%: 5.0000 mL | Freq: Once | INTRAVENOUS | Status: DC | PRN

## 2016-11-29 NOTE — Procedures (Signed)
Interventional Radiology Procedure Note  Procedure: Rescue of displaced supra-pubic catheter.  New 48F pigtail to gravity.  Retention suture placed.   Complications: None Recommendations:  - Ok to DC home now - To gravity drainage. - Do not submerge   - Routine drain and wound care   Signed,  Dulcy Fanny. Earleen Newport, DO

## 2016-11-29 NOTE — Telephone Encounter (Signed)
Patient's son called stating patient's home nurse had just notified him that her tube from her belly came out. Patient recently had an Suprapubic tube exchange in [email or web address]  on 11-19-16. Tried contacting [facility] to see if patient could be seen today, nurse was in a procedure unable to update. Spoke with Zara Council, PAC she states patient needs to go to the ER for cath replacement with fluoroscopy. Spoke with Audry Pili and notified him of this and he states he will take patient to ER for SP Tube replacement.

## 2016-12-03 ENCOUNTER — Ambulatory Visit: Payer: Medicare HMO | Admitting: Urology

## 2016-12-03 ENCOUNTER — Encounter: Payer: Medicare HMO | Admitting: Surgery

## 2016-12-03 DIAGNOSIS — L89620 Pressure ulcer of left heel, unstageable: Secondary | ICD-10-CM | POA: Diagnosis not present

## 2016-12-04 NOTE — Progress Notes (Signed)
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 caregiver. Three times weekly - HHRN Follow-up Appointments: Wound #1 Right,Medial Calcaneus: Return Appointment in 1 week. Wound #2 Left,Medial Calcaneus: Return Appointment in 1 week. Off-Loading: Wound #1 Right,Medial Calcaneus: Other: - float heels when in bed; keep pressure off during the day. Sage Boots at night. Wound #2 Left,Medial Calcaneus: Other: - float heels when in bed; keep pressure off during the day. Sage Boots at night. Home Health: Wound #1 Right,Medial Calcaneus: Continue Home Health Visits - [facility]  Home Health Nurse may visit PRN to address patient s wound care needs. FACE TO FACE ENCOUNTER: MEDICARE and MEDICAID PATIENTS: I certify that this patient is under my care and that I had a face-to-face encounter that meets the physician face-to-face encounter requirements with this patient on this date. The encounter  with the patient was in whole or in part for the following MEDICAL CONDITION: (primary reason for [place]) MEDICAL NECESSITY: I certify, that based on my findings, NURSING services are a medically necessary home health service. HOME BOUND STATUS: I certify that my clinical findings support that this patient is homebound (i.e., Due to illness or injury, pt requires aid of supportive devices such as crutches, cane, wheelchairs, walkers, the use of special transportation or the assistance of another person to leave their place of residence. There is a normal inability to leave the home and doing so requires considerable and taxing effort. Other absences are for medical reasons / religious services and are infrequent or of short duration when for other reasons). If current dressing causes regression in wound condition, may D/C ordered dressing product/s and apply Normal Saline Moist Dressing daily until next [place] / Other MD appointment. [place] of regression in wound condition at [phone]. Please direct any NON-WOUND related issues/requests for orders to patient's Primary Care Physician Wound #2 Left,Medial Calcaneus: [place] Visits - [place] Nurse may visit PRN to address patient s wound care needs. FACE TO FACE ENCOUNTER: MEDICARE and MEDICAID PATIENTS: I certify that this patient is under my care and that I had a face-to-face encounter that meets the physician face-to-face encounter requirements with this patient on this date. The encounter with the patient was in whole or in part for the following MEDICAL CONDITION: (primary reason for [place]) MEDICAL NECESSITY: I certify, that based on my findings, NURSING services are a medically necessary home health service. HOME BOUND STATUS: I certify that my clinical findings support that this patient is homebound (i.e., Due to illness or injury, pt requires aid of  supportive devices such as crutches, cane, wheelchairs, walkers, the use of special transportation or the assistance of another person to leave their place of residence. There is a normal inability to leave the home and doing so requires considerable and taxing effort. Other absences are for medical reasons / religious services and are infrequent or of short duration when for other reasons). If current dressing causes regression in wound condition, may D/C ordered dressing product/s and apply Normal Saline Moist Dressing daily until next [place] / Other MD appointment. [place] of regression in wound condition at [phone]. Please direct any NON-WOUND related issues/requests for orders to patient's Primary Care Physician General Notes: Epifix authorization [name], [name] ([id number]) After 12 weeks of aggressive wound care treatment this is the best her heels have looked at at this stage she has healthy granulation tissue and minimal subcutaneous debris. I have recommended application of a skin substitute and depending on her insurance we will try and get up Epifix, Oasis or appropriate cellular tissue base product. In the meanwhile I have recommended: 1. Santyl ointment to both heels, heel cups and offloading with Sage boots. 2. Silver alginate to be placed between her toes on the right foot. 3. Adequate proteins, vitamin A, vitamin C and zinc. Electronic Signature(s) Signed: 12/03/2016 3:59:20 PM By: Christin Fudge MD, FACS Previous Signature: 12/03/2016 3:03:11 PM Version By: Christin Fudge MD, FACS Previous Signature: 12/03/2016  3:02:55 PM Version By: Christin Fudge MD, FACS Entered By: Christin Fudge on 12/03/2016 15:59:20 [name] ([id number]) -------------------------------------------------------------------------------- SuperBill Details Patient Name: [name] Date of Service: 12/03/2016 Medical Record Number: [id number] Patient Account  Number: [id number] Date of Birth/Sex: 1931-10-15 (83 y.o. 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 Facility Procedures CPT4 Code Description: 60479987 11042 - DEB SUBQ TISSUE 20 SQ CM/< ICD-10 Description Diagnosis L89.620 Pressure ulcer of left heel, unstageable L89.610 Pressure ulcer of right heel, unstageable 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 Modifier: ot with fat la Quantity: 1 yer exposed Physician Procedures CPT4 Code Description: [id number] 11042 - WC PHYS SUBQ TISS 20 SQ CM ICD-10 Description Diagnosis L89.620 Pressure ulcer of left heel, unstageable L89.610 Pressure ulcer of right heel, unstageable L97.512 Non-pressure chronic ulcer of other part of right fo Modifier: ot with fat lay Quantity: 1 er exposed Electronic Signature(s) Signed: 12/03/2016 3:03:20 PM By: Christin Fudge MD, FACS Entered By: Christin Fudge on 12/03/2016 15:03:20

## 2016-12-04 NOTE — Progress Notes (Signed)
[name], [name] ([id number]) Visit Report for 12/03/2016 Arrival Information Details Patient Name: [name], [name]. Date of Service: 12/03/2016 2:15 PM Medical Record Number: [id number] Patient Account Number: [id number] Date of Birth/Sex: [birth year] (83 y.o. Female) Treating RN: Cornell Barman Primary Care Morgen Ritacco: [name] Other Clinician: Referring Amy Belloso: [name] Treating Holdan Stucke/Extender: Frann Rider in Treatment: 12 Visit Information History Since Last Visit Added or deleted any medications: No Patient Arrived: Wheel Chair Any new allergies or adverse reactions: No Arrival Time: 14:16 Had a fall or experienced change in No activities of daily living that may affect Accompanied By: self risk of falls: Transfer Assistance: Hoyer Lift Signs or symptoms of abuse/neglect since last No Patient Identification Verified: Yes visito Secondary Verification Process Yes Hospitalized since last visit: No Completed: Has Dressing in Place as Prescribed: Yes Patient Requires Transmission-Based No Pain Present Now: No Precautions: Patient Has Alerts: No Electronic Signature(s) Signed: 12/03/2016 4:57:49 PM By: Gretta Cool, RN, BSN, Kim RN, BSN Entered By: Gretta Cool, RN, BSN, Kim on 12/03/2016 14:16:51 [name] ([id number]) -------------------------------------------------------------------------------- Encounter Discharge Information Details Patient Name: [name]. Date of Service: 12/03/2016 2:15 PM Medical Record Number: [id number] Patient Account Number: [id number] Date of Birth/Sex: Jul 23, 1931 (83 y.o. Female) Treating RN: Cornell Barman Primary Care Rosaisela Jamroz: [name] Other Clinician: Referring Leticia Coletta: [name] Treating Karsyn Jamie/Extender: Frann Rider in Treatment: 12 Encounter Discharge Information Items Discharge Pain Level: 2 Discharge Condition: Stable Ambulatory Status: Wheelchair Discharge Destination: Home Transportation:  Private Auto Accompanied By: self Schedule Follow-up Appointment: Yes Medication Reconciliation completed and provided to Patient/Care Yes Eulonda Andalon: Provided on Clinical Summary of Care: 12/03/2016 Form Type Recipient Paper Patient EB Electronic Signature(s) Signed: 12/03/2016 3:08:22 PM By: Ruthine Dose Entered By: Ruthine Dose on 12/03/2016 15:08:22 [name] ([id number]) -------------------------------------------------------------------------------- Lower Extremity Assessment Details Patient Name: [name]. Date of Service: 12/03/2016 2:15 PM Medical Record Number: [id number] Patient Account Number: [id number] Date of Birth/Sex: [birth year] (83 y.o. Female) Treating RN: Cornell Barman Primary Care Areg Bialas: [name] Other Clinician: Referring Bethel Gaglio: [name] Treating Kensey Luepke/Extender: Frann Rider in Treatment: 12 Vascular Assessment Pulses: Dorsalis Pedis Palpable: [Left:Yes] [Right:Yes] Posterior Tibial Extremity colors, hair growth, and conditions: Hair Growth on Extremity: [Left:No] [Right:No] Temperature of Extremity: [Left:Warm] [Right:Warm] Capillary Refill: [Left:< 3 seconds] [Right:> 3 seconds] Dependent Rubor: [Left:No] [Right:No] Blanched when Elevated: [Left:No] [Right:No] Lipodermatosclerosis: [Left:No] [Right:No] Toe Nail Assessment Left: Right: Thick: Yes Yes Discolored: Yes Yes Deformed: Yes Yes Improper Length and Hygiene: Yes Yes Electronic Signature(s) Signed: 12/03/2016 4:57:49 PM By: Gretta Cool, RN, BSN, Kim RN, BSN Entered By: Gretta Cool, RN, BSN, Kim on 12/03/2016 14:33:39 [name] ([id number]) -------------------------------------------------------------------------------- Multi Wound Chart Details Patient Name: [name]. Date of Service: 12/03/2016 2:15 PM Medical Record Number: [id number] Patient Account Number: [id number] Date of Birth/Sex: Nov 01, 1930 (83 y.o. Female) Treating RN: Cornell Barman Primary  Care Jonesha Tsuchiya: [name] Other Clinician: Referring Beckam Abdulaziz: [name] Treating Maisen Schmit/Extender: Frann Rider in Treatment: 12 Vital Signs Height(in): 63 Pulse(bpm): 72 Weight(lbs): 160 Blood Pressure 114/44 (mmHg): Body Mass Index(BMI): 28 Temperature(F): 98.2 Respiratory Rate 16 (breaths/min): Photos: [1:No Photos] [2:No Photos] [N/A:N/A] Wound Location: [1:Right Calcaneus - Medial] [2:Left Calcaneus - Medial] [N/A:N/A] Wounding Event: [1:Pressure Injury] [2:Pressure Injury] [N/A:N/A] Primary Etiology: [1:Pressure Ulcer] [2:Pressure Ulcer] [N/A:N/A] Comorbid History: [1:Cataracts, Hypertension] [2:Cataracts, Hypertension] [N/A:N/A] Date Acquired: [1:07/02/2016] [2:07/02/2016] [N/A:N/A] Weeks of Treatment: [1:12] [2:12] [N/A:N/A] Wound Status: [1:Open] [2:Open] [N/A:N/A] Measurements L x W x D 4x4.5x0.9 [2:2x3x0.9] [N/A:N/A] (cm) Area (cm) : [1:14.137] [2:4.712] [N/A:N/A] Volume (cm) : [  1:12.723] [2:4.241] [N/A:N/A] % Reduction in Area: [1:-14.30%] [2:76.20%] [N/A:N/A] % Reduction in Volume: -928.50% [2:-114.30%] [N/A:N/A] Classification: [1:Category/Stage III] [2:Category/Stage III] [N/A:N/A] Exudate Amount: [1:Large] [2:Large] [N/A:N/A] Exudate Type: [1:Serosanguineous] [2:Serosanguineous] [N/A:N/A] Exudate Color: [1:red, brown] [2:red, brown] [N/A:N/A] Foul Odor After [1:Yes] [2:Yes] [N/A:N/A] Cleansing: Odor Anticipated Due to No [2:No] [N/A:N/A] Product Use: Wound Margin: [1:Flat and Intact] [2:Flat and Intact] [N/A:N/A] Granulation Amount: [1:Medium (34-66%)] [2:Medium (34-66%)] [N/A:N/A] Granulation Quality: [1:Pink] [2:Pink] [N/A:N/A] Necrotic Amount: [1:Medium (34-66%)] [2:Medium (34-66%)] [N/A:N/A] Necrotic Tissue: [1:Eschar, Adherent Slough] [2:Eschar, Adherent Slough] [N/A:N/A] Exposed Structures: [1:Fat Layer (Subcutaneous Tissue) Exposed: Yes] [2:Fat Layer (Subcutaneous Tissue) Exposed: Yes] [N/A:N/A] Epithelialization: None  None N/A Debridement: Debridement (77412- Debridement (87867- N/A 11047) 11047) Pre-procedure 14:39 14:39 N/A Verification/Time Out Taken: Pain Control: Other Other N/A Tissue Debrided: Fat Fat, Subcutaneous N/A Level: Skin/Subcutaneous Skin/Subcutaneous N/A Tissue Tissue Debridement Area (sq 18 6 N/A cm): Instrument: Forceps, Scissors Forceps, Scissors N/A Bleeding: Minimum Minimum N/A Hemostasis Achieved: Pressure Pressure N/A Procedural Pain: 0 0 N/A Debridement Treatment Procedure was tolerated Procedure was tolerated N/A Response: well well Post Debridement 4x4.5x0.9 2x3x0.9 N/A Measurements L x W x D (cm) Post Debridement 12.723 4.241 N/A Volume: (cm) Post Debridement Category/Stage III Category/Stage III N/A Stage: Periwound Skin Texture: Excoriation: Yes Excoriation: Yes N/A Induration: No Induration: No Callus: No Callus: No Crepitus: No Crepitus: No Rash: No Rash: No Scarring: No Scarring: No Periwound Skin Maceration: Yes Maceration: Yes N/A Moisture: Dry/Scaly: No Dry/Scaly: No Periwound Skin Color: Atrophie Blanche: No Atrophie Blanche: No N/A Cyanosis: No Cyanosis: No Ecchymosis: No Ecchymosis: No Erythema: No Erythema: No Hemosiderin Staining: No Hemosiderin Staining: No Mottled: No Mottled: No Pallor: No Pallor: No Rubor: No Rubor: No Temperature: No Abnormality No Abnormality N/A Tenderness on Yes Yes N/A Palpation: Wound Preparation: Ulcer Cleansing: Ulcer Cleansing: N/A Rinsed/Irrigated with Rinsed/Irrigated with Saline Saline Topical Anesthetic Topical Anesthetic [name], [name] ([id number]) Applied: Other: lidocaine Applied: Other: Lidocaine 4% 4% Procedures Performed: Debridement Debridement N/A Treatment Notes Wound #1 (Right, Medial Calcaneus) 1. Cleansed with: Clean wound with Normal Saline 2. Anesthetic Topical Lidocaine 4% cream to wound bed prior to debridement 4. Dressing Applied: Santyl Ointment 5. Secondary  Dressing Applied Gauze and Kerlix/Conform Notes heel cup Wound #2 (Left, Medial Calcaneus) 1. Cleansed with: Clean wound with Normal Saline 2. Anesthetic Topical Lidocaine 4% cream to wound bed prior to debridement 4. Dressing Applied: Santyl Ointment 5. Secondary Dressing Applied Gauze and Kerlix/Conform Notes heel cup Electronic Signature(s) Signed: 12/03/2016 2:59:10 PM By: Christin Fudge MD, FACS Entered By: Christin Fudge on 12/03/2016 14:59:09 [name] ([id number]) -------------------------------------------------------------------------------- [place] Details Patient Name: [name] Date of Service: 12/03/2016 2:15 PM Medical Record Number: [id number] Patient Account Number: [id number] Date of Birth/Sex: 05-Mar-1931 (83 y.o. Female) Treating RN: Cornell Barman Primary Care Joniya Boberg: [name] Other Clinician: Referring Derran Sear: [name] Treating Inocencio Roy/Extender: Frann Rider in Treatment: 12 Active Inactive ` Abuse / Safety / Falls / Self Care Management Nursing Diagnoses: Impaired physical mobility Potential for falls Goals: Patient will remain injury free Date Initiated: 09/06/2016 Target Resolution Date: 12/02/2016 Goal Status: Active Interventions: Assess fall risk on admission and as needed Assess self care needs on admission and as needed Notes: ` Necrotic Tissue Nursing Diagnoses: Impaired tissue integrity related to necrotic/devitalized tissue Goals: Necrotic/devitalized tissue will be minimized in the wound bed Date Initiated: 09/06/2016 Target Resolution Date: 12/02/2016 Goal Status: Active Interventions: Assess patient pain level pre-, during and post procedure and prior to discharge Notes: ` Orientation  to the Wound Care Program Nursing Diagnoses: [name], [name] ([id number]) Knowledge deficit related to the wound healing center program Goals: Patient/caregiver will verbalize understanding  of the [facility] Date Initiated: 09/06/2016 Target Resolution Date: 12/02/2016 Goal Status: Active Interventions: Provide education on orientation to the wound center Notes: ` Pressure Nursing Diagnoses: Knowledge deficit related to management of pressures ulcers Potential for impaired tissue integrity related to pressure, friction, moisture, and shear Goals: Patient will remain free of pressure ulcers Date Initiated: 09/06/2016 Target Resolution Date: 12/02/2016 Goal Status: Active Interventions: Assess: immobility, friction, shearing, incontinence upon admission and as needed Notes: ` Soft Tissue Infection Nursing Diagnoses: Impaired tissue integrity Potential for infection: soft tissue Goals: Patient will remain free of wound infection Date Initiated: 09/06/2016 Target Resolution Date: 12/02/2016 Goal Status: Active Interventions: Assess signs and symptoms of infection every visit Notes: ` Wound/Skin Impairment [name], [name] ([id number]) Nursing Diagnoses: Impaired tissue integrity Goals: Ulcer/skin breakdown will heal within 14 weeks Date Initiated: 09/06/2016 Target Resolution Date: 12/16/2016 Goal Status: Active Interventions: Assess ulceration(s) every visit Notes: Electronic Signature(s) Signed: 12/03/2016 4:57:49 PM By: Gretta Cool, RN, BSN, Kim RN, BSN Entered By: Gretta Cool, RN, BSN, Kim on 12/03/2016 14:33:44 [name] ([id number]) -------------------------------------------------------------------------------- Pain Assessment Details Patient Name: [name]. Date of Service: 12/03/2016 2:15 PM Medical Record Number: [id number] Patient Account Number: [id number] Date of Birth/Sex: 1931/02/08 (83 y.o. Female) Treating RN: Cornell Barman Primary Care Nathali Vent: [name] Other Clinician: Referring Aalyssa Elderkin: [name] Treating Stephana Morell/Extender: Frann Rider in Treatment: 12 Active Problems Location of Pain Severity  and Description of Pain Patient Has Paino No Site Locations With Dressing Change: No Pain Management and Medication Current Pain Management: Electronic Signature(s) Signed: 12/03/2016 4:57:49 PM By: Gretta Cool, RN, BSN, Kim RN, BSN Entered By: Gretta Cool, RN, BSN, Kim on 12/03/2016 14:16:58 [name] ([id number]) -------------------------------------------------------------------------------- Patient/Caregiver Education Details Patient Name: [name] Date of Service: 12/03/2016 2:15 PM Medical Record Number: [id number] Patient Account Number: [id number] Date of Birth/Gender: Jan 01, 1931 (83 y.o. Female) Treating RN: Cornell Barman Primary Care Physician: [name] Other Clinician: Referring Physician: [name] Treating Physician/Extender: Frann Rider in Treatment: 12 Education Assessment Education Provided To: Patient Education Topics Provided Wound Debridement: Handouts: Wound Debridement Methods: Demonstration Responses: State content correctly Wound/Skin Impairment: Handouts: Caring for Your Ulcer Methods: Demonstration, Explain/Verbal Responses: State content correctly Electronic Signature(s) Signed: 12/03/2016 4:57:49 PM By: Gretta Cool, RN, BSN, Kim RN, BSN Entered By: Gretta Cool, RN, BSN, Kim on 12/03/2016 14:46:22 [name] ([id number]) -------------------------------------------------------------------------------- Wound Assessment Details Patient Name: [name]. Date of Service: 12/03/2016 2:15 PM Medical Record Number: [id number] Patient Account Number: [id number] Date of Birth/Sex: Feb 15, 1931 (83 y.o. Female) Treating RN: Cornell Barman Primary Care Junaid Wurzer: [name] Other Clinician: Referring Evadna Donaghy: [name] Treating Mindy Behnken/Extender: Frann Rider in Treatment: 12 Wound Status Wound Number: 1 Primary Etiology: Pressure Ulcer Wound Location: Right Calcaneus - Medial Wound Status: Open Wounding Event: Pressure  Injury Comorbid History: Cataracts, Hypertension Date Acquired: 07/02/2016 Weeks Of Treatment: 12 Clustered Wound: No Photos Photo Uploaded By: Gretta Cool, RN, BSN, Kim on 12/03/2016 15:20:00 Wound Measurements Length: (cm) 4 Width: (cm) 4.5 Depth: (cm) 0.9 Area: (cm) 14.137 Volume: (cm) 12.723 % Reduction in Area: -14.3% % Reduction in Volume: -928.5% Epithelialization: None Tunneling: No Undermining: No Wound Description Classification: Category/Stage III Wound Margin: Flat and Intact Exudate Amount: Large Exudate Type: Serosanguineous Exudate Color: red, brown Foul Odor After Cleansing: Yes Due to Product Use: No Wound Bed Granulation Amount: Medium (34-66%)  Exposed Structure Granulation Quality: Pink Fat Layer (Subcutaneous Tissue) Exposed: Yes Necrotic Amount: Medium (34-66%) Necrotic Quality: Eschar, Adherent Slough Periwound Skin Texture Texture Color [name], HENSARLING. ([id number]) No Abnormalities Noted: No No Abnormalities Noted: No Callus: No Atrophie Blanche: No Crepitus: No Cyanosis: No Excoriation: Yes Ecchymosis: No Induration: No Erythema: No Rash: No Hemosiderin Staining: No Scarring: No Mottled: No Pallor: No Moisture Rubor: No No Abnormalities Noted: No Dry / Scaly: No Temperature / Pain Maceration: Yes Temperature: No Abnormality Tenderness on Palpation: Yes Wound Preparation Ulcer Cleansing: Rinsed/Irrigated with Saline Topical Anesthetic Applied: Other: lidocaine 4%, Treatment Notes Wound #1 (Right, Medial Calcaneus) 1. Cleansed with: Clean wound with Normal Saline 2. Anesthetic Topical Lidocaine 4% cream to wound bed prior to debridement 4. Dressing Applied: Santyl Ointment 5. Secondary Dressing Applied Gauze and Kerlix/Conform Notes heel cup Electronic Signature(s) Signed: 12/03/2016 4:57:49 PM By: Gretta Cool, RN, BSN, Kim RN, BSN Entered By: Gretta Cool, RN, BSN, Kim on 12/03/2016 14:32:12 [name]  ([id number]) -------------------------------------------------------------------------------- Wound Assessment Details Patient Name: [name]. Date of Service: 12/03/2016 2:15 PM Medical Record Number: [id number] Patient Account Number: [id number] Date of Birth/Sex: [birth year] (83 y.o. Female) Treating RN: Cornell Barman Primary Care Kween Bacorn: [name] Other Clinician: Referring Lyda Colcord: [name] Treating Fajr Fife/Extender: Frann Rider in Treatment: 12 Wound Status Wound Number: 2 Primary Etiology: Pressure Ulcer Wound Location: Left Calcaneus - Medial Wound Status: Open Wounding Event: Pressure Injury Comorbid History: Cataracts, Hypertension Date Acquired: 07/02/2016 Weeks Of Treatment: 12 Clustered Wound: No Photos Photo Uploaded By: Gretta Cool, RN, BSN, Kim on 12/03/2016 15:20:16 Wound Measurements Length: (cm) 2 Width: (cm) 3 Depth: (cm) 0.9 Area: (cm) 4.712 Volume: (cm) 4.241 % Reduction in Area: 76.2% % Reduction in Volume: -114.3% Epithelialization: None Tunneling: No Undermining: No Wound Description Classification: Category/Stage III Wound Margin: Flat and Intact Exudate Amount: Large Exudate Type: Serosanguineous Exudate Color: red, brown Foul Odor After Cleansing: Yes Due to Product Use: No Slough/Fibrino Yes Wound Bed Granulation Amount: Medium (34-66%) Exposed Structure Granulation Quality: Pink Fat Layer (Subcutaneous Tissue) Exposed: Yes Necrotic Amount: Medium (34-66%) Necrotic Quality: Eschar, Adherent Slough Periwound Skin Texture Texture Color [name], ROYCE. ([id number]) No Abnormalities Noted: No No Abnormalities Noted: No Callus: No Atrophie Blanche: No Crepitus: No Cyanosis: No Excoriation: Yes Ecchymosis: No Induration: No Erythema: No Rash: No Hemosiderin Staining: No Scarring: No Mottled: No Pallor: No Moisture Rubor: No No Abnormalities Noted: No Dry / Scaly: No Temperature / Pain Maceration:  Yes Temperature: No Abnormality Tenderness on Palpation: Yes Wound Preparation Ulcer Cleansing: Rinsed/Irrigated with Saline Topical Anesthetic Applied: Other: Lidocaine 4%, Treatment Notes Wound #2 (Left, Medial Calcaneus) 1. Cleansed with: Clean wound with Normal Saline 2. Anesthetic Topical Lidocaine 4% cream to wound bed prior to debridement 4. Dressing Applied: Santyl Ointment 5. Secondary Dressing Applied Gauze and Kerlix/Conform Notes heel cup Electronic Signature(s) Signed: 12/03/2016 4:57:49 PM By: Gretta Cool, RN, BSN, Kim RN, BSN Entered By: Gretta Cool, RN, BSN, Kim on 12/03/2016 14:32:30 [name] ([id number]) -------------------------------------------------------------------------------- [place] Details Patient Name: [name] Date of Service: 12/03/2016 2:15 PM Medical Record Number: [id number] Patient Account Number: [id number] Date of Birth/Sex: [birth year] (83 y.o. Female) Treating RN: Cornell Barman Primary Care Chantella Creech: [name] Other Clinician: Referring Dyonna Jaspers: [name] Treating Marvia Troost/Extender: Frann Rider in Treatment: 12 Vital Signs Time Taken: 14:17 Temperature (F): 98.2 Height (in): 63 Pulse (bpm): 72 Weight (lbs): 160 Respiratory Rate (breaths/min): 16 Body Mass Index (BMI): 28.3 Blood Pressure (mmHg): 114/44 Reference Range: 80 - 120 mg /  dl Electronic Signature(s) Signed: 12/03/2016 4:57:49 PM By: Gretta Cool, RN, BSN, Kim RN, BSN Entered By: Gretta Cool, RN, BSN, Kim on 12/03/2016 14:17:49

## 2016-12-10 ENCOUNTER — Encounter: Payer: Medicare HMO | Admitting: Surgery

## 2016-12-10 DIAGNOSIS — L89620 Pressure ulcer of left heel, unstageable: Secondary | ICD-10-CM | POA: Diagnosis not present

## 2016-12-11 NOTE — Progress Notes (Addendum)
MAKYNLEIGH, BRESLIN ([id number]) Visit Report for 12/10/2016 Arrival Information Details Patient Name: [name], [name]. Date of Service: 12/10/2016 2:15 PM Medical Record Number: [id number] Patient Account Number: [id number] Date of Birth/Sex: [birth year] (83 y.o. Female) Treating RN: Afful, RN, BSN, Velva Harman Primary Care Kimela Malstrom: [name] Other Clinician: Referring Gabriele Loveland: [name] Treating Karsin Pesta/Extender: Frann Rider in Treatment: 13 Visit Information History Since Last Visit All ordered tests and consults were completed: No Patient Arrived: Wheel Chair Added or deleted any medications: No Arrival Time: 14:42 Any new allergies or adverse reactions: No Accompanied By: self Had a fall or experienced change in No Transfer Assistance: [employer] activities of daily living that may affect Patient Lift risk of falls: Patient Identification Verified: Yes Signs or symptoms of abuse/neglect since last No Secondary Verification Process Yes visito Completed: Hospitalized since last visit: No Patient Requires Transmission- No Has Dressing in Place as Prescribed: Yes Based Precautions: Pain Present Now: Yes Patient Has Alerts: No Electronic Signature(s) Signed: 12/10/2016 3:39:46 PM By: Regan Lemming BSN, RN Entered By: Regan Lemming on 12/10/2016 14:50:40 [name] ([id number]) -------------------------------------------------------------------------------- Encounter Discharge Information Details Patient Name: [name]. Date of Service: 12/10/2016 2:15 PM Medical Record Number: [id number] Patient Account Number: [id number] Date of Birth/Sex: [name], 1931 (83 y.o. Female) Treating RN: Afful, RN, BSN, Velva Harman Primary Care Kamillah Didonato: [name] Other Clinician: Referring Mayumi Summerson: [name] Treating Jaekwon Mcclune/Extender: Frann Rider in Treatment: 13 Encounter Discharge Information Items Discharge Pain Level: 3 Discharge Condition:  Stable Ambulatory Status: Wheelchair Discharge Destination: Home Transportation: Other Accompanied By: self Schedule Follow-up Appointment: No Medication Reconciliation completed and provided to Patient/Care No Joren Rehm: Provided on Clinical Summary of Care: 12/10/2016 Form Type Recipient Paper Patient EB Notes transported by [facility] transportation. Electronic Signature(s) Signed: 12/10/2016 3:39:46 PM By: Regan Lemming BSN, RN Previous Signature: 12/10/2016 3:20:32 PM Version By: Ruthine Dose Entered By: Regan Lemming on 12/10/2016 15:21:24 [name] ([id number]) -------------------------------------------------------------------------------- Lower Extremity Assessment Details Patient Name: [name]. Date of Service: 12/10/2016 2:15 PM Medical Record Number: [id number] Patient Account Number: [id number] Date of Birth/Sex: 01-15-1931 (83 y.o. Female) Treating RN: Afful, RN, BSN, Velva Harman Primary Care Eldine Rencher: [name] Other Clinician: Referring Anias Bartol: [name] Treating Rainn Zupko/Extender: Frann Rider in Treatment: 13 Edema Assessment Assessed: [Left: No] [Right: No] Edema: [Left: No] [Right: No] Vascular Assessment Pulses: Dorsalis Pedis Palpable: [Left:Yes] [Right:Yes] Posterior Tibial Extremity colors, hair growth, and conditions: Extremity Color: [Left:Dusky] [Right:Dusky] Hair Growth on Extremity: [Left:No] [Right:No] Temperature of Extremity: [Left:Warm] [Right:Warm] Capillary Refill: [Left:< 3 seconds] [Right:< 3 seconds] Toe Nail Assessment Left: Right: Thick: Yes Yes Discolored: Yes Yes Deformed: Yes Yes Improper Length and Hygiene: Yes Yes Electronic Signature(s) Signed: 12/10/2016 3:39:46 PM By: Regan Lemming BSN, RN Entered By: Regan Lemming on 12/10/2016 15:03:11 [name] ([id number]) -------------------------------------------------------------------------------- Multi Wound Chart Details Patient Name: [name]. Date  of Service: 12/10/2016 2:15 PM Medical Record Number: [id number] Patient Account Number: [id number] Date of Birth/Sex: 09-Jun-1931 (83 y.o. Female) Treating RN: Baruch Gouty, RN, BSN, Velva Harman Primary Care Gentri Guardado: [name] Other Clinician: Referring Akisha Sturgill: [name] Treating Luise Yamamoto/Extender: Frann Rider in Treatment: 13 Vital Signs Height(in): 63 Pulse(bpm): 65 Weight(lbs): 160 Blood Pressure 136/74 (mmHg): Body Mass Index(BMI): 28 Temperature(F): 98.4 Respiratory Rate 16 (breaths/min): Photos: [N/A:N/A] Wound Location: Right Calcaneus - Medial Left Calcaneus - Medial N/A Wounding Event: Pressure Injury Pressure Injury N/A Primary Etiology: Pressure Ulcer Pressure Ulcer N/A Comorbid History: Cataracts, Hypertension Cataracts, Hypertension N/A Date Acquired: 07/02/2016 07/02/2016 N/A Weeks of Treatment: 13  13 N/A Wound Status: Open Open N/A Measurements L x W x D 4x4.5x0.3 1.8x2.5x0.3 N/A (cm) Area (cm) : 14.137 3.534 N/A Volume (cm) : 4.241 1.06 N/A % Reduction in Area: -14.30% 82.10% N/A % Reduction in Volume: -242.80% 46.40% N/A Classification: Category/Stage III Category/Stage III N/A Exudate Amount: Large Large N/A Exudate Type: Serosanguineous Serosanguineous N/A Exudate Color: red, brown red, brown N/A Foul Odor After Yes Yes N/A Cleansing: Odor Anticipated Due to No No N/A Product Use: Wound Margin: Flat and Intact Flat and Intact N/A Granulation Amount: Large (67-100%) Medium (34-66%) N/A KAMERIN, AXFORD. ([id number]) Granulation Quality: Pink Pink N/A Necrotic Amount: Small (1-33%) Medium (34-66%) N/A Exposed Structures: Fat Layer (Subcutaneous Fat Layer (Subcutaneous N/A Tissue) Exposed: Yes Tissue) Exposed: Yes Epithelialization: None None N/A Debridement: Debridement (06301- Debridement (60109- N/A 11047) 11047) Pre-procedure 15:02 15:06 N/A Verification/Time Out Taken: Pain Control: Lidocaine 4% Topical Lidocaine 4% Topical  N/A Solution Solution Tissue Debrided: Fibrin/Slough, Fibrin/Slough, N/A Subcutaneous Subcutaneous Level: Skin/Subcutaneous Skin/Subcutaneous N/A Tissue Tissue Debridement Area (sq 18 4.5 N/A cm): Instrument: Curette Curette N/A Bleeding: Minimum Minimum N/A Hemostasis Achieved: Pressure Pressure N/A Procedural Pain: 3 3 N/A Post Procedural Pain: 3 3 N/A Debridement Treatment Procedure was tolerated Procedure was tolerated N/A Response: well well Post Debridement 4x4.5x0.3 1.8x2.5x0.3 N/A Measurements L x W x D (cm) Post Debridement 4.241 1.06 N/A Volume: (cm) Post Debridement Category/Stage III Category/Stage III N/A Stage: Periwound Skin Texture: Excoriation: Yes Excoriation: Yes N/A Induration: No Induration: No Callus: No Callus: No Crepitus: No Crepitus: No Rash: No Rash: No Scarring: No Scarring: No Periwound Skin Maceration: Yes Maceration: Yes N/A Moisture: Dry/Scaly: No Dry/Scaly: No Periwound Skin Color: Atrophie Blanche: No Atrophie Blanche: No N/A Cyanosis: No Cyanosis: No Ecchymosis: No Ecchymosis: No Erythema: No Erythema: No Hemosiderin Staining: No Hemosiderin Staining: No Mottled: No Mottled: No Pallor: No Pallor: No Rubor: No Rubor: No Temperature: No Abnormality No Abnormality N/A Yes Yes N/A Raver, Celica S. ([id number]) Tenderness on Palpation: Wound Preparation: Ulcer Cleansing: Ulcer Cleansing: N/A Rinsed/Irrigated with Rinsed/Irrigated with Saline Saline Topical Anesthetic Topical Anesthetic Applied: Other: lidocaine Applied: Other: Lidocaine 4% 4% Procedures Performed: Debridement Debridement N/A Treatment Notes Wound #1 (Right, Medial Calcaneus) 1. Cleansed with: Clean wound with Normal Saline 2. Anesthetic Topical Lidocaine 4% cream to wound bed prior to debridement 4. Dressing Applied: Santyl Ointment 5. Secondary Dressing Applied Gauze and Kerlix/Conform Notes heel cup Wound #2 (Left, Medial Calcaneus) 1.  Cleansed with: Clean wound with Normal Saline 2. Anesthetic Topical Lidocaine 4% cream to wound bed prior to debridement 4. Dressing Applied: Santyl Ointment 5. Secondary Dressing Applied Gauze and Kerlix/Conform Notes heel cup Electronic Signature(s) Signed: 12/10/2016 4:02:20 PM By: Christin Fudge MD, FACS Previous Signature: 12/10/2016 3:39:46 PM Version By: Regan Lemming BSN, RN Entered By: Christin Fudge on 12/10/2016 16:02:20 [name] ([id number]) -------------------------------------------------------------------------------- [place] Details Patient Name: [name] Date of Service: 12/10/2016 2:15 PM Medical Record Number: [id number] Patient Account Number: [id number] Date of Birth/Sex: [birth year] (83 y.o. Female) Treating RN: Afful, RN, BSN, Velva Harman Primary Care Maryah Marinaro: [name] Other Clinician: Referring Na Waldrip: [name] Treating Jaquaya Coyle/Extender: Frann Rider in Treatment: 13 Active Inactive ` Abuse / Safety / Falls / Self Care Management Nursing Diagnoses: Impaired physical mobility Potential for falls Goals: Patient will remain injury free Date Initiated: 09/06/2016 Target Resolution Date: 12/02/2016 Goal Status: Active Interventions: Assess fall risk on admission and as needed Assess self care needs on admission and as needed Notes: ` Necrotic Tissue Nursing  Diagnoses: Impaired tissue integrity related to necrotic/devitalized tissue Goals: Necrotic/devitalized tissue will be minimized in the wound bed Date Initiated: 09/06/2016 Target Resolution Date: 12/02/2016 Goal Status: Active Interventions: Assess patient pain level pre-, during and post procedure and prior to discharge Notes: ` Orientation to the Wound Care Program Nursing Diagnoses: BREEONNA, MONE ([id number]) Knowledge deficit related to the wound healing center program Goals: Patient/caregiver will verbalize understanding of the [place] Date Initiated: 09/06/2016 Target Resolution Date: 12/02/2016 Goal Status: Active Interventions: Provide education on orientation to the wound center Notes: ` Pressure Nursing Diagnoses: Knowledge deficit related to management of pressures ulcers Potential for impaired tissue integrity related to pressure, friction, moisture, and shear Goals: Patient will remain free of pressure ulcers Date Initiated: 09/06/2016 Target Resolution Date: 12/02/2016 Goal Status: Active Interventions: Assess: immobility, friction, shearing, incontinence upon admission and as needed Notes: ` Soft Tissue Infection Nursing Diagnoses: Impaired tissue integrity Potential for infection: soft tissue Goals: Patient will remain free of wound infection Date Initiated: 09/06/2016 Target Resolution Date: 12/02/2016 Goal Status: Active Interventions: Assess signs and symptoms of infection every visit Notes: ` Wound/Skin Impairment MILLY, GOGGINS ([id number]) Nursing Diagnoses: Impaired tissue integrity Goals: Ulcer/skin breakdown will heal within 14 weeks Date Initiated: 09/06/2016 Target Resolution Date: 12/16/2016 Goal Status: Active Interventions: Assess ulceration(s) every visit Notes: Electronic Signature(s) Signed: 12/10/2016 3:39:46 PM By: Regan Lemming BSN, RN Entered By: Regan Lemming on 12/10/2016 15:03:15 [name] ([id number]) -------------------------------------------------------------------------------- Pain Assessment Details Patient Name: [name]. Date of Service: 12/10/2016 2:15 PM Medical Record Number: [id number] Patient Account Number: [id number] Date of Birth/Sex: 1931-06-05 (83 y.o. Female) Treating RN: Afful, RN, BSN, Velva Harman Primary Care Marylu Dudenhoeffer: [name] Other Clinician: Referring Coulson Wehner: [name] Treating Saeed Toren/Extender: Frann Rider in Treatment: 13 Active Problems Location of Pain Severity and Description of  Pain Patient Has Paino Yes Site Locations Pain Location: Pain in Ulcers Rate the pain. Current Pain Level: 3 Character of Pain Describe the Pain: Aching, Tender Pain Management and Medication Current Pain Management: Electronic Signature(s) Signed: 12/10/2016 3:39:46 PM By: Regan Lemming BSN, RN Entered By: Regan Lemming on 12/10/2016 14:51:10 [name] ([id number]) -------------------------------------------------------------------------------- Patient/Caregiver Education Details Patient Name: [name]. Date of Service: 12/10/2016 2:15 PM Medical Record Number: [id number] Patient Account Number: [id number] Date of Birth/Gender: 03-23-1931 (83 y.o. Female) Treating RN: Baruch Gouty, RN, BSN, Velva Harman Primary Care Physician: [name] Other Clinician: Referring Physician: [name] Treating Physician/Extender: Frann Rider in Treatment: 13 Education Assessment Education Provided To: Patient and [place] [facility] Education Topics Provided [place]: Methods: Explain/Verbal Responses: State content correctly Wound Debridement: Methods: Explain/Verbal Responses: State content correctly Wound/Skin Impairment: Methods: Explain/Verbal Responses: State content correctly Electronic Signature(s) Signed: 12/10/2016 3:39:46 PM By: Regan Lemming BSN, RN Entered By: Regan Lemming on 12/10/2016 15:21:48 [name] ([id number]) -------------------------------------------------------------------------------- Wound Assessment Details Patient Name: [name]. Date of Service: 12/10/2016 2:15 PM Medical Record Number: [id number] Patient Account Number: [id number] Date of Birth/Sex: [birth year] (83 y.o. Female) Treating RN: Afful, RN, BSN, Velva Harman Primary Care Mackie Goon: [name] Other Clinician: Referring  Jon: [name] Treating Noell Shular/Extender: Frann Rider in Treatment: 13 Wound Status Wound Number:  1 Primary Etiology: Pressure Ulcer Wound Location: Right Calcaneus - Medial Wound Status: Open Wounding Event: Pressure Injury Comorbid History: Cataracts, Hypertension Date Acquired: 07/02/2016 Weeks Of Treatment: 13 Clustered Wound: No Photos Photo Uploaded By: Regan Lemming on 12/10/2016 15:39:18 Wound Measurements Length: (cm) 4 Width: (cm) 4.5  Depth: (cm) 0.3 Area: (cm) 14.137 Volume: (cm) 4.241 % Reduction in Area: -14.3% % Reduction in Volume: -242.8% Epithelialization: None Tunneling: No Undermining: No Wound Description Classification: Category/Stage III Foul Odor Aft Wound Margin: Flat and Intact Due to Produc Exudate Amount: Large Exudate Type: Serosanguineous Exudate Color: red, brown er Cleansing: Yes t Use: No Wound Bed Granulation Amount: Large (67-100%) Exposed Structure Granulation Quality: Pink Fat Layer (Subcutaneous Tissue) Exposed: Yes Necrotic Amount: Small (1-33%) Necrotic Quality: [address] [place]. ([id number]) Periwound Skin Texture Texture Color No Abnormalities Noted: No No Abnormalities Noted: No Callus: No Atrophie Blanche: No Crepitus: No Cyanosis: No Excoriation: Yes Ecchymosis: No Induration: No Erythema: No Rash: No Hemosiderin Staining: No Scarring: No Mottled: No Pallor: No Moisture Rubor: No No Abnormalities Noted: No Dry / Scaly: No Temperature / Pain Maceration: Yes Temperature: No Abnormality Tenderness on Palpation: Yes Wound Preparation Ulcer Cleansing: Rinsed/Irrigated with Saline Topical Anesthetic Applied: Other: lidocaine 4%, Treatment Notes Wound #1 (Right, Medial Calcaneus) 1. Cleansed with: Clean wound with Normal Saline 2. Anesthetic Topical Lidocaine 4% cream to wound bed prior to debridement 4. Dressing Applied: Santyl Ointment 5. Secondary Dressing Applied Gauze and Kerlix/Conform Notes heel cup Electronic Signature(s) Signed: 12/10/2016 3:39:46 PM By: Regan Lemming BSN,  RN Entered By: Regan Lemming on 12/10/2016 14:57:08 [name] ([id number]) -------------------------------------------------------------------------------- Wound Assessment Details Patient Name: [name]. Date of Service: 12/10/2016 2:15 PM Medical Record Number: [id number] Patient Account Number: [id number] Date of Birth/Sex: [birth year] (83 y.o. Female) Treating RN: Afful, RN, BSN, [place] Primary Care Advay Volante: [name] Other Clinician: Referring Jilene Spohr: [name] Treating Ensley [name]/Extender: Frann Rider in Treatment: 13 Wound Status Wound Number: 2 Primary Etiology: Pressure Ulcer Wound Location: Left Calcaneus - Medial Wound Status: Open Wounding Event: Pressure Injury Comorbid History: Cataracts, Hypertension Date Acquired: 07/02/2016 Weeks Of Treatment: 13 Clustered Wound: No Photos Photo Uploaded By: Regan Lemming on 12/10/2016 15:39:19 Wound Measurements Length: (cm) 1.8 % Reduction in Width: (cm) 2.5 % Reduction in Depth: (cm) 0.3 Epithelializat Area: (cm) 3.534 Tunneling: Volume: (cm) 1.06 Undermining: Area: 82.1% Volume: 46.4% ion: None No No Wound Description Classification: Category/Stage III Foul Odor Aft Wound Margin: Flat and Intact Due to Produc Exudate Amount: Large Slough/Fibrin Exudate Type: Serosanguineous Exudate Color: red, brown er Cleansing: Yes t Use: No o Yes Wound Bed Granulation Amount: Medium (34-66%) Exposed Structure Granulation Quality: Pink Fat Layer (Subcutaneous Tissue) Exposed: Yes Necrotic Amount: Medium (34-66%) Necrotic Quality: [address] [place] S. ([id number]) Periwound Skin Texture Texture Color No Abnormalities Noted: No No Abnormalities Noted: No Callus: No Atrophie Blanche: No Crepitus: No Cyanosis: No Excoriation: Yes Ecchymosis: No Induration: No Erythema: No Rash: No Hemosiderin Staining: No Scarring: No Mottled: No Pallor: No Moisture Rubor: No No  Abnormalities Noted: No Dry / Scaly: No Temperature / Pain Maceration: Yes Temperature: No Abnormality Tenderness on Palpation: Yes Wound Preparation Ulcer Cleansing: Rinsed/Irrigated with Saline Topical Anesthetic Applied: Other: Lidocaine 4%, Treatment Notes Wound #2 (Left, Medial Calcaneus) 1. Cleansed with: Clean wound with Normal Saline 2. Anesthetic Topical Lidocaine 4% cream to wound bed prior to debridement 4. Dressing Applied: Santyl Ointment 5. Secondary Dressing Applied Gauze and Kerlix/Conform Notes heel cup Electronic Signature(s) Signed: 12/10/2016 3:39:46 PM By: Regan Lemming BSN, RN Entered By: Regan Lemming on 12/10/2016 14:57:21 [name] ([id number]) -------------------------------------------------------------------------------- [place] Details Patient Name: [name] Date of Service: 12/10/2016 2:15 PM Medical Record Number: [id number] Patient Account Number: [id number] Date of Birth/Sex: 1931-03-22 (83 y.o. Female) Treating RN: Afful,  RN, BSN, Velva Harman Primary Care Shahad Mazurek: [name] Other Clinician: Referring Mohan Erven: [name] Treating Kaulana Brindle/Extender: Frann Rider in Treatment: 13 Vital Signs Time Taken: 14:51 Temperature (F): 98.4 Height (in): 63 Pulse (bpm): 65 Weight (lbs): 160 Respiratory Rate (breaths/min): 16 Body Mass Index (BMI): 28.3 Blood Pressure (mmHg): 136/74 Reference Range: 80 - 120 mg / dl Electronic Signature(s) Signed: 12/10/2016 3:39:46 PM By: Regan Lemming BSN, RN Entered By: Regan Lemming on 12/10/2016 14:51:57

## 2016-12-15 NOTE — Progress Notes (Signed)
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 Normal Saline. Wound #2 Left,Medial Calcaneus: Clean wound with Normal Saline. Anesthetic: Wound #1 Right,Medial Calcaneus: Topical Lidocaine 4% cream applied to wound bed prior to debridement - in clinic only Wound #2 Left,Medial Calcaneus: Topical Lidocaine 4% cream applied to wound bed prior to debridement - in clinic only Primary Wound Dressing: Wound #1 Right,Medial Calcaneus: Santyl Ointment Wound #2 Left,Medial Calcaneus: Santyl Ointment Secondary Dressing: Wound #1 Right,Medial Calcaneus: Dry Gauze -  Foam on tops of feet due to reddened areas. Conform/Kerlix Other - heel cups or ABD pads to support offloading Wound #2 Left,Medial Calcaneus: Dry Gauze - Foam on tops of feet due to reddened areas. Conform/Kerlix Other - heel cups/ABD pads for offloading support Dressing Change Frequency: Wound #1 Right,Medial  Calcaneus: [name], Danielle S. ([id number]) Change dressing every day. - by [facility] or trained caregiver. Three times weekly - HHRN Wound #2 Left,Medial Calcaneus: Change dressing every day. - by [facility] or trained caregiver. Three times weekly - HHRN Follow-up Appointments: Wound #1 Right,Medial Calcaneus: Return Appointment in 1 week. Wound #2 Left,Medial Calcaneus: Return Appointment in 1 week. Off-Loading: Wound #1 Right,Medial Calcaneus: Other: - float heels when in bed; keep pressure off during the day. Sage Boots at night. Wound #2 Left,Medial Calcaneus: Other: - float heels when in bed; keep pressure off during the day. Sage Boots at night. Home Health: Wound #1 Right,Medial Calcaneus: Continue Home Health Visits - [place] Nurse may visit PRN to address patient s wound care needs. FACE TO FACE ENCOUNTER: MEDICARE and MEDICAID PATIENTS: I certify that this patient is under my care and that I had a face-to-face encounter that meets the physician face-to-face encounter requirements with this patient on this date. The encounter with the patient was in whole or in part for the following MEDICAL CONDITION: (primary reason for [place]) MEDICAL NECESSITY: I certify, that based on my findings, NURSING services are a medically necessary home health service. HOME BOUND STATUS: I certify that my clinical findings support that this patient is homebound (i.e., Due to illness or injury, pt requires aid of supportive devices such as crutches, cane, wheelchairs, walkers, the use of special transportation or the assistance of another person to leave their place of residence. There is a normal inability to leave the home and doing so requires considerable and taxing effort. Other absences are for medical reasons / religious services and are infrequent or of short duration when for other reasons). If current dressing causes regression in wound condition, may D/C ordered dressing  product/s and apply Normal Saline Moist Dressing daily until next [place] / Other MD appointment. [place] of regression in wound condition at [phone]. Please direct any NON-WOUND related issues/requests for orders to patient's Primary Care Physician Wound #2 Left,Medial Calcaneus: [place] Visits - [place] Nurse may visit PRN to address patient s wound care needs. FACE TO FACE ENCOUNTER: MEDICARE and MEDICAID PATIENTS: I certify that this patient is under my care and that I had a face-to-face encounter that meets the physician face-to-face encounter requirements with this patient on this date. The encounter with the patient was in whole or in part for the following MEDICAL CONDITION: (primary reason for [place]) MEDICAL NECESSITY: I certify, that based on my findings, NURSING services are a medically necessary home health service. HOME BOUND STATUS: I certify that my clinical findings support that this patient is homebound (i.e., Due to illness or injury, pt requires aid of supportive devices such as crutches, cane, wheelchairs, walkers, the use of special transportation or the assistance of another person to leave their place of residence. There is a normal inability to leave the home and doing so requires considerable and taxing effort. Other absences are for medical reasons / religious services and are infrequent or of short duration when for other reasons). If current dressing causes regression in wound condition, may D/C ordered dressing product/s and apply Normal Saline Moist  Dressing daily until next [place] / Other MD appointment. [place] of regression in wound condition at [phone]. Please direct any NON-WOUND related issues/requests for orders to patient's Primary Care Physician [name], [name] ([id number]) I have recommended application of a skin substitute and depending on her  insurance we will try and get up Epifix, Oasis or appropriate cellular tissue base product. Co-payment and insurance clearance is pending. In the meanwhile I have recommended: 1. Santyl ointment to both heels, heel cups and offloading with Sage boots. 2. Silver alginate to be placed between her toes on the right foot. 3. Adequate proteins, vitamin A, vitamin C and zinc. Electronic Signature(s) Signed: 12/10/2016 4:08:51 PM By: Christin Fudge MD, FACS Entered By: Christin Fudge on 12/10/2016 16:08:51 [name] ([id number]) -------------------------------------------------------------------------------- SuperBill Details Patient Name: [name]. Date of Service: 12/10/2016 Medical Record Number: [id number] Patient Account Number: [id number] Date of Birth/Sex: Oct 08, 1931 (83 y.o. 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 Facility Procedures CPT4 Code: 10034961 Description: 11042 - DEB SUBQ TISSUE 20 SQ CM/< ICD-10 Description Diagnosis L89.620 Pressure ulcer of left heel, unstageable L89.610 Pressure ulcer of right heel, unstageable Z99.3 Dependence on wheelchair Modifier: Quantity: 1 Physician Procedures CPT4 Code: [id number] Description: 11042 - WC PHYS SUBQ TISS 20 SQ CM ICD-10 Description Diagnosis L89.620 Pressure ulcer of left heel, unstageable L89.610 Pressure ulcer of right heel, unstageable Z99.3 Dependence on wheelchair Modifier: Quantity: 1 Electronic Signature(s) Signed: 12/10/2016 4:09:02 PM By: Christin Fudge MD, FACS Entered  By: Christin Fudge on 12/10/2016 16:09:02

## 2016-12-17 ENCOUNTER — Ambulatory Visit: Payer: Medicare HMO | Admitting: Urology

## 2016-12-17 ENCOUNTER — Encounter: Payer: Medicare HMO | Attending: Surgery | Admitting: Surgery

## 2016-12-17 DIAGNOSIS — L89620 Pressure ulcer of left heel, unstageable: Secondary | ICD-10-CM | POA: Insufficient documentation

## 2016-12-17 DIAGNOSIS — I129 Hypertensive chronic kidney disease with stage 1 through stage 4 chronic kidney disease, or unspecified chronic kidney disease: Secondary | ICD-10-CM | POA: Diagnosis not present

## 2016-12-17 DIAGNOSIS — L97512 Non-pressure chronic ulcer of other part of right foot with fat layer exposed: Secondary | ICD-10-CM | POA: Insufficient documentation

## 2016-12-17 DIAGNOSIS — D649 Anemia, unspecified: Secondary | ICD-10-CM | POA: Diagnosis not present

## 2016-12-17 DIAGNOSIS — Z993 Dependence on wheelchair: Secondary | ICD-10-CM | POA: Diagnosis not present

## 2016-12-17 DIAGNOSIS — N189 Chronic kidney disease, unspecified: Secondary | ICD-10-CM | POA: Insufficient documentation

## 2016-12-17 DIAGNOSIS — L89610 Pressure ulcer of right heel, unstageable: Secondary | ICD-10-CM | POA: Insufficient documentation

## 2016-12-18 NOTE — Progress Notes (Signed)
[name], [name] ([id number]) Visit Report for 12/17/2016 Arrival Information Details Patient Name: [name]. Date of Service: 12/17/2016 3:30 PM Medical Record Number: [id number] Patient Account Number: [id number] Date of Birth/Sex: [birth year] (83 y.o. Female) Treating RN: Montey Hora Primary Care Neo Yepiz: [name] Other Clinician: Referring Edker Punt: [name] Treating Natasha Paulson/Extender: Frann Rider in Treatment: 14 Visit Information History Since Last Visit Added or deleted any medications: No Patient Arrived: Ambulatory Any new allergies or adverse reactions: No Arrival Time: 15:48 Hospitalized since last visit: No Accompanied By: self Has Dressing in Place as Prescribed: Yes Transfer Assistance: None Has Compression in Place as Prescribed: No Patient Identification Verified: Yes Has Footwear/Offloading in Place as Yes Secondary Verification Process Yes Prescribed: Completed: Left: Other:Sage Patient Requires Transmission-Based No Boot Precautions: Right: Other:Sage Patient Has Alerts: No Boot Pain Present Now: No Electronic Signature(s) Signed: 12/17/2016 4:52:07 PM By: Montey Hora Entered By: Montey Hora on 12/17/2016 15:49:21 [name] ([id number]) -------------------------------------------------------------------------------- Encounter Discharge Information Details Patient Name: [name]. Date of Service: 12/17/2016 3:30 PM Medical Record Number: [id number] Patient Account Number: [id number] Date of Birth/Sex: March 20, 1931 (83 y.o. Female) Treating RN: Montey Hora Primary Care Amberlyn Martinezgarcia: [name] Other Clinician: Referring Yandel Zeiner: [name] Treating Pennie Vanblarcom/Extender: Frann Rider in Treatment: 14 Encounter Discharge Information Items Discharge Pain Level: 0 Discharge Condition: Stable Ambulatory Status: Wheelchair Discharge Destination: Home Transportation: Private Auto Accompanied By:  self Schedule Follow-up Appointment: Yes Medication Reconciliation completed No and provided to Patient/Care [place]: Provided on Clinical Summary of Care: 12/17/2016 Form Type Recipient Paper Patient EB Electronic Signature(s) Signed: 12/17/2016 4:40:11 PM By: Montey Hora Previous Signature: 12/17/2016 4:27:03 PM Version By: Sharon Mt Entered By: Montey Hora on 12/17/2016 16:40:11 [name] ([id number]) -------------------------------------------------------------------------------- Lower Extremity Assessment Details Patient Name: [name]. Date of Service: 12/17/2016 3:30 PM Medical Record Number: [id number] Patient Account Number: [id number] Date of Birth/Sex: 11/12/1930 (83 y.o. Female) Treating RN: Montey Hora Primary Care Verdene Creson: [name] Other Clinician: Referring Kham Zuckerman: [name] Treating Jearld Hemp/Extender: Frann Rider in Treatment: 14 Vascular Assessment Pulses: Dorsalis Pedis Palpable: [Left:Yes] [Right:Yes] Posterior Tibial Extremity colors, hair growth, and conditions: Extremity Color: [Left:Hyperpigmented] [Right:Hyperpigmented] Hair Growth on Extremity: [Left:No] [Right:No] Temperature of Extremity: [Left:Warm] [Right:Warm] Capillary Refill: [Left:< 3 seconds] [Right:< 3 seconds] Toe Nail Assessment Left: Right: Thick: Yes Yes Discolored: Yes Yes Deformed: Yes Yes Improper Length and Hygiene: Yes Yes Electronic Signature(s) Signed: 12/17/2016 4:52:07 PM By: Montey Hora Entered By: Montey Hora on 12/17/2016 15:58:45 [name] ([id number]) -------------------------------------------------------------------------------- Multi Wound Chart Details Patient Name: [name]. Date of Service: 12/17/2016 3:30 PM Medical Record Number: [id number] Patient Account Number: [id number] Date of Birth/Sex: 1931-08-21 (83 y.o. Female) Treating RN: Montey Hora Primary Care Markale Birdsell: [name] Other  Clinician: Referring Dura Mccormack: [name] Treating Adron Geisel/Extender: Frann Rider in Treatment: 14 Vital Signs Height(in): 63 Pulse(bpm): 67 Weight(lbs): 160 Blood Pressure 126/55 (mmHg): Body Mass Index(BMI): 28 Temperature(F): 98.2 Respiratory Rate 16 (breaths/min): Photos: [1:No Photos] [2:No Photos] [N/A:N/A] Wound Location: [1:Right Calcaneus - Medial] [2:Left Calcaneus - Medial] [N/A:N/A] Wounding Event: [1:Pressure Injury] [2:Pressure Injury] [N/A:N/A] Primary Etiology: [1:Pressure Ulcer] [2:Pressure Ulcer] [N/A:N/A] Comorbid History: [1:Cataracts, Hypertension] [2:Cataracts, Hypertension] [N/A:N/A] Date Acquired: [1:07/02/2016] [2:07/02/2016] [N/A:N/A] Weeks of Treatment: [1:14] [2:14] [N/A:N/A] Wound Status: [1:Open] [2:Open] [N/A:N/A] Measurements L x W x D 1.4x3x0.3 [2:2x6x0.9] [N/A:N/A] (cm) Area (cm) : [1:3.299] [2:9.425] [N/A:N/A] Volume (cm) : [1:0.99] [2:8.482] [N/A:N/A] % Reduction in Area: [1:73.30%] [2:52.40%] [N/A:N/A] % Reduction in Volume: 20.00% [2:-328.60%] [N/A:N/A] Classification: [1:Category/Stage  III] [2:Category/Stage III] [N/A:N/A] Exudate Amount: [1:Large] [2:Large] [N/A:N/A] Exudate Type: [1:Serosanguineous] [2:Serosanguineous] [N/A:N/A] Exudate Color: [1:red, brown] [2:red, brown] [N/A:N/A] Foul Odor After [1:Yes] [2:Yes] [N/A:N/A] Cleansing: Odor Anticipated Due to No [2:No] [N/A:N/A] Product Use: Wound Margin: [1:Flat and Intact] [2:Flat and Intact] [N/A:N/A] Granulation Amount: [1:Large (67-100%)] [2:Large (67-100%)] [N/A:N/A] Granulation Quality: [1:Pink] [2:Pink] [N/A:N/A] Necrotic Amount: [1:Small (1-33%)] [2:Small (1-33%)] [N/A:N/A] Exposed Structures: [1:Fat Layer (Subcutaneous Tissue) Exposed: Yes] [2:Fat Layer (Subcutaneous Tissue) Exposed: Yes] [N/A:N/A] Epithelialization: [1:None] [2:None] [N/A:N/A] Debridement: Debridement ([id number]- Debridement ([id number]- N/A 11047) 11047) Pre-procedure 15:59 16:01  N/A Verification/Time Out Taken: Pain Control: Lidocaine 4% Topical Lidocaine 4% Topical N/A Solution Solution Tissue Debrided: Fibrin/Slough, Fibrin/Slough, N/A Subcutaneous Subcutaneous Level: Skin/Subcutaneous Skin/Subcutaneous N/A Tissue Tissue Debridement Area (sq 4.2 12 N/A cm): Instrument: Curette Curette N/A Bleeding: Minimum Minimum N/A Hemostasis Achieved: Pressure Pressure N/A Procedural Pain: 0 0 N/A Post Procedural Pain: 0 0 N/A Debridement Treatment Procedure was tolerated Procedure was tolerated N/A Response: well well Post Debridement 1.4x3x0.3 2x6x0.9 N/A Measurements L x W x D (cm) Post Debridement 0.99 8.482 N/A Volume: (cm) Post Debridement Category/Stage III Category/Stage III N/A Stage: Periwound Skin Texture: Excoriation: Yes Excoriation: Yes N/A Induration: No Induration: No Callus: No Callus: No Crepitus: No Crepitus: No Rash: No Rash: No Scarring: No Scarring: No Periwound Skin Maceration: Yes Maceration: Yes N/A Moisture: Dry/Scaly: No Dry/Scaly: No Periwound Skin Color: Atrophie Blanche: No Atrophie Blanche: No N/A Cyanosis: No Cyanosis: No Ecchymosis: No Ecchymosis: No Erythema: No Erythema: No Hemosiderin Staining: No Hemosiderin Staining: No Mottled: No Mottled: No Pallor: No Pallor: No Rubor: No Rubor: No Temperature: No Abnormality No Abnormality N/A Tenderness on Yes Yes N/A Palpation: Wound Preparation: Ulcer Cleansing: Ulcer Cleansing: N/A Rinsed/Irrigated with Rinsed/Irrigated with Saline Saline Robotham, Zell S. ([id number]) Topical Anesthetic Topical Anesthetic Applied: Other: lidocaine Applied: Other: Lidocaine 4% 4% Procedures Performed: Debridement Debridement N/A Treatment Notes Electronic Signature(s) Signed: 12/17/2016 4:09:46 PM By: Christin Fudge MD, FACS Entered By: Christin Fudge on 12/17/2016 16:09:46 [name]  ([id number]) -------------------------------------------------------------------------------- [place] Details Patient Name: [name] Date of Service: 12/17/2016 3:30 PM Medical Record Number: [id number] Patient Account Number: [id number] Date of Birth/Sex: 04-23-1931 (83 y.o. Female) Treating RN: Montey Hora Primary Care Alyssandra Hulsebus: [name] Other Clinician: Referring Jazzmin Newbold: [name] Treating Delmer Kowalski/Extender: Frann Rider in Treatment: 14 Active Inactive ` Abuse / Safety / Falls / Self Care Management Nursing Diagnoses: Impaired physical mobility Potential for falls Goals: Patient will remain injury free Date Initiated: 09/06/2016 Target Resolution Date: 12/02/2016 Goal Status: Active Interventions: Assess fall risk on admission and as needed Assess self care needs on admission and as needed Notes: ` Necrotic Tissue Nursing Diagnoses: Impaired tissue integrity related to necrotic/devitalized tissue Goals: Necrotic/devitalized tissue will be minimized in the wound bed Date Initiated: 09/06/2016 Target Resolution Date: 12/02/2016 Goal Status: Active Interventions: Assess patient pain level pre-, during and post procedure and prior to discharge Notes: ` Orientation to the Wound Care Program Nursing Diagnoses: LASHUNDA, GREIS ([id number]) Knowledge deficit related to the wound healing center program Goals: Patient/caregiver will verbalize understanding of the [facility] Date Initiated: 09/06/2016 Target Resolution Date: 12/02/2016 Goal Status: Active Interventions: Provide education on orientation to the wound center Notes: ` Pressure Nursing Diagnoses: Knowledge deficit related to management of pressures ulcers Potential for impaired tissue integrity related to pressure, friction, moisture, and shear Goals: Patient will remain free of pressure ulcers Date Initiated: 09/06/2016 Target  Resolution Date: 12/02/2016 Goal Status: Active Interventions: Assess: immobility,  friction, shearing, incontinence upon admission and as needed Notes: ` Soft Tissue Infection Nursing Diagnoses: Impaired tissue integrity Potential for infection: soft tissue Goals: Patient will remain free of wound infection Date Initiated: 09/06/2016 Target Resolution Date: 12/02/2016 Goal Status: Active Interventions: Assess signs and symptoms of infection every visit Notes: ` Wound/Skin Impairment DILAN, FULLENWIDER ([id number]) Nursing Diagnoses: Impaired tissue integrity Goals: Ulcer/skin breakdown will heal within 14 weeks Date Initiated: 09/06/2016 Target Resolution Date: 12/16/2016 Goal Status: Active Interventions: Assess ulceration(s) every visit Notes: Electronic Signature(s) Signed: 12/17/2016 4:52:07 PM By: Montey Hora Entered By: Montey Hora on 12/17/2016 15:58:59 Sirmon, Bobbe Zuniga ([id number]) -------------------------------------------------------------------------------- Pain Assessment Details Patient Name: [name]. Date of Service: 12/17/2016 3:30 PM Medical Record Number: [id number] Patient Account Number: [id number] Date of Birth/Sex: 1931-08-11 (83 y.o. Female) Treating RN: Montey Hora Primary Care Zalayah Pizzuto: [name] Other Clinician: Referring Berniece Abid: [name] Treating Maurico Perrell/Extender: Frann Rider in Treatment: 14 Active Problems Location of Pain Severity and Description of Pain Patient Has Paino No Site Locations With Dressing Change: No Pain Management and Medication Current Pain Management: Electronic Signature(s) Signed: 12/17/2016 4:52:07 PM By: Montey Hora Entered By: Montey Hora on 12/17/2016 15:49:27 [name] ([id number]) -------------------------------------------------------------------------------- Patient/Caregiver Education Details Patient Name: [name]. Date of Service: 12/17/2016 3:30  PM Medical Record Number: [id number] Patient Account Number: [id number] Date of Birth/Gender: [birth year] (83 y.o. Female) Treating RN: Montey Hora Primary Care Physician: [name] Other Clinician: Referring Physician: [name] Treating Physician/Extender: Frann Rider in Treatment: 14 Education Assessment Education Provided To: Patient Education Topics Provided Wound/Skin Impairment: Handouts: Other: wound care as ordered Methods: Demonstration, Explain/Verbal Responses: State content correctly Electronic Signature(s) Signed: 12/17/2016 4:52:07 PM By: Montey Hora Entered By: Montey Hora on 12/17/2016 16:40:27 Blankley, Bobbe Zuniga ([id number]) -------------------------------------------------------------------------------- Wound Assessment Details Patient Name: [name]. Date of Service: 12/17/2016 3:30 PM Medical Record Number: [id number] Patient Account Number: [id number] Date of Birth/Sex: 04-15-1931 (83 y.o. Female) Treating RN: Montey Hora Primary Care Gini Caputo: [name] Other Clinician: Referring Joycie Aerts: [name] Treating Dezyrae Kensinger/Extender: Frann Rider in Treatment: 14 Wound Status Wound Number: 1 Primary Etiology: Pressure Ulcer Wound Location: Right Calcaneus - Medial Wound Status: Open Wounding Event: Pressure Injury Comorbid History: Cataracts, Hypertension Date Acquired: 07/02/2016 Weeks Of Treatment: 14 Clustered Wound: No Photos Photo Uploaded By: Montey Hora on 12/17/2016 16:54:39 Wound Measurements Length: (cm) 1.4 Width: (cm) 3 Depth: (cm) 0.3 Area: (cm) 3.299 Volume: (cm) 0.99 % Reduction in Area: 73.3% % Reduction in Volume: 20% Epithelialization: None Tunneling: No Undermining: No Wound Description Classification: Category/Stage III Wound Margin: Flat and Intact Exudate Amount: Large Exudate Type: Serosanguineous Exudate Color: red, brown Foul Odor After Cleansing: Yes Due to  Product Use: No Wound Bed Granulation Amount: Large (67-100%) Exposed Structure Granulation Quality: Pink Fat Layer (Subcutaneous Tissue) Exposed: Yes Necrotic Amount: Small (1-33%) Necrotic Quality: Adherent [address] [place] ([id number]) Periwound Skin Texture Texture Color No Abnormalities Noted: No No Abnormalities Noted: No Callus: No Atrophie Blanche: No Crepitus: No Cyanosis: No Excoriation: Yes Ecchymosis: No Induration: No Erythema: No Rash: No Hemosiderin Staining: No Scarring: No Mottled: No Pallor: No Moisture Rubor: No No Abnormalities Noted: No Dry / Scaly: No Temperature / Pain Maceration: Yes Temperature: No Abnormality Tenderness on Palpation: Yes Wound Preparation Ulcer Cleansing: Rinsed/Irrigated with Saline Topical Anesthetic Applied: Other: lidocaine 4%, Treatment Notes Wound #1 (Right, Medial Calcaneus) 1. Cleansed with: Clean wound with Normal Saline 2. Anesthetic Topical Lidocaine 4% cream to wound bed prior to debridement  4. Dressing Applied: Other dressing (specify in notes) 5. Secondary Dressing Applied Gauze and Kerlix/Conform Notes endoform, heel cup Electronic Signature(s) Signed: 12/17/2016 4:52:07 PM By: Montey Hora Entered By: Montey Hora on 12/17/2016 15:57:03 Sharber, Bobbe Zuniga ([id number]) -------------------------------------------------------------------------------- Wound Assessment Details Patient Name: [name]. Date of Service: 12/17/2016 3:30 PM Medical Record Number: [id number] Patient Account Number: [id number] Date of Birth/Sex: 24-Jul-1931 (83 y.o. Female) Treating RN: Montey Hora Primary Care Marla Pouliot: [name] Other Clinician: Referring Kabria Hetzer: [name] Treating Ciria Bernardini/Extender: Frann Rider in Treatment: 14 Wound Status Wound Number: 2 Primary Etiology: Pressure Ulcer Wound Location: Left Calcaneus - Medial Wound Status: Open Wounding Event: Pressure  Injury Comorbid History: Cataracts, Hypertension Date Acquired: 07/02/2016 Weeks Of Treatment: 14 Clustered Wound: No Photos Photo Uploaded By: Montey Hora on 12/17/2016 16:54:20 Wound Measurements Length: (cm) 2 Width: (cm) 6 Depth: (cm) 0.9 Area: (cm) 9.425 Volume: (cm) 8.482 % Reduction in Area: 52.4% % Reduction in Volume: -328.6% Epithelialization: None Tunneling: No Undermining: No Wound Description Classification: Category/Stage III Wound Margin: Flat and Intact Exudate Amount: Large Exudate Type: Serosanguineous Exudate Color: red, brown Foul Odor After Cleansing: Yes Due to Product Use: No Slough/Fibrino Yes Wound Bed Granulation Amount: Large (67-100%) Exposed Structure Granulation Quality: Pink Fat Layer (Subcutaneous Tissue) Exposed: Yes Necrotic Amount: Small (1-33%) Necrotic Quality: Adherent [address], [place]. ([id number]) Periwound Skin Texture Texture Color No Abnormalities Noted: No No Abnormalities Noted: No Callus: No Atrophie Blanche: No Crepitus: No Cyanosis: No Excoriation: Yes Ecchymosis: No Induration: No Erythema: No Rash: No Hemosiderin Staining: No Scarring: No Mottled: No Pallor: No Moisture Rubor: No No Abnormalities Noted: No Dry / Scaly: No Temperature / Pain Maceration: Yes Temperature: No Abnormality Tenderness on Palpation: Yes Wound Preparation Ulcer Cleansing: Rinsed/Irrigated with Saline Topical Anesthetic Applied: Other: Lidocaine 4%, Treatment Notes Wound #2 (Left, Medial Calcaneus) 1. Cleansed with: Clean wound with Normal Saline 2. Anesthetic Topical Lidocaine 4% cream to wound bed prior to debridement 4. Dressing Applied: Other dressing (specify in notes) 5. Secondary Dressing Applied Gauze and Kerlix/Conform Notes endoform, heel cup Electronic Signature(s) Signed: 12/17/2016 4:52:07 PM By: Montey Hora Entered By: Montey Hora on 12/17/2016 15:56:25 [name]  ([id number]) -------------------------------------------------------------------------------- [place] Details Patient Name: [name]. Date of Service: 12/17/2016 3:30 PM Medical Record Number: [id number] Patient Account Number: [id number] Date of Birth/Sex: 09-30-1931 (83 y.o. Female) Treating RN: Montey Hora Primary Care Chai Routh: [name] Other Clinician: Referring Alenah Sarria: [name] Treating Lopez Dentinger/Extender: Frann Rider in Treatment: 14 Vital Signs Time Taken: 15:49 Temperature (F): 98.2 Height (in): 63 Pulse (bpm): 67 Weight (lbs): 160 Respiratory Rate (breaths/min): 16 Body Mass Index (BMI): 28.3 Blood Pressure (mmHg): 126/55 Reference Range: 80 - 120 mg / dl Electronic Signature(s) Signed: 12/17/2016 4:52:07 PM By: Montey Hora Entered By: Montey Hora on 12/17/2016 15:50:00

## 2016-12-18 NOTE — Progress Notes (Signed)
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 Dressing Monday, Wednesday, Friday Three times weekly - [facility] Wound #2 Left,Medial Calcaneus: [name], [name] ([id number]) Change Dressing Monday, Wednesday, Friday Three times weekly - [facility] Follow-up Appointments: Wound #1 Right,Medial Calcaneus: Return Appointment in 1 week. Wound #2 Left,Medial Calcaneus: Return Appointment in 1 week. Off-Loading: Wound #1 Right,Medial Calcaneus: Other: - float heels  when in bed; keep pressure off during the day. Sage Boots at night. Wound #2 Left,Medial Calcaneus: Other: - float heels when in bed; keep pressure off during the day. Sage Boots at night. Home Health: Wound #1 Right,Medial Calcaneus: Continue Home Health Visits - [place] Nurse may visit PRN to address patient s wound care needs. FACE TO  FACE ENCOUNTER: MEDICARE and MEDICAID PATIENTS: I certify that this patient is under my care and that I had a face-to-face encounter that meets the physician face-to-face encounter requirements with this patient on this date. The encounter with the patient was in whole or in part for the following MEDICAL CONDITION: (primary reason for [place]) MEDICAL NECESSITY: I certify, that based on my findings, NURSING services are a medically necessary home health service. HOME BOUND STATUS: I certify that my clinical findings support that this patient is homebound (i.e., Due to illness or injury, pt requires aid of supportive devices such as crutches, cane, wheelchairs, walkers, the use of special transportation or the assistance of another person to leave their place of residence. There is a normal inability to leave the home and doing so requires considerable and taxing effort. Other absences are for medical reasons / religious services and are infrequent or of short duration when for other reasons). If current dressing causes regression in wound condition, may D/C ordered dressing product/s and apply Normal Saline Moist Dressing daily until next [place] / Other MD appointment. [place] of regression in wound condition at [phone]. Please direct any NON-WOUND related issues/requests for orders to patient's Primary Care Physician Wound #2 Left,Medial Calcaneus: [place] Visits - [place] Nurse may visit PRN to address patient s wound care needs. FACE TO FACE ENCOUNTER: MEDICARE and MEDICAID PATIENTS: I certify that this patient is under my care and that I had a face-to-face encounter that meets the physician face-to-face encounter requirements with this patient on this date. The encounter with the patient was in whole or in part for the following MEDICAL CONDITION: (primary reason for [place]) MEDICAL NECESSITY: I certify, that  based on my findings, NURSING services are a medically necessary home health service. HOME BOUND STATUS: I certify that my clinical findings support that this patient is homebound (i.e., Due to illness or injury, pt requires aid of supportive devices such as crutches, cane, wheelchairs, walkers, the use of special transportation or the assistance of another person to leave their place of residence. There is a normal inability to leave the home and doing so requires considerable and taxing effort. Other absences are for medical reasons / religious services and are infrequent or of short duration when for other reasons). If current dressing causes regression in wound condition, may D/C ordered dressing product/s and apply Normal Saline Moist Dressing daily until next [place] / Other MD appointment. [place] of regression in wound condition at [phone]. Please direct any NON-WOUND related issues/requests for orders to patient's Primary Care Physician [name], [name] ([id number]) Authotization for a skin substitute has not yet been approved. In the meanwhile I have recommended: 1. Endoform to both heels, heel cups and offloading with Sage boots. 2. Adequate proteins, vitamin A, vitamin C and zinc. Electronic Signature(s) Signed: 12/17/2016 4:13:01 PM By: Christin Fudge MD, FACS Entered By: Christin Fudge on 12/17/2016 16:13:01 [name] ([id number]) -------------------------------------------------------------------------------- SuperBill Details Patient Name: [name]. Date of Service: 12/17/2016 Medical Record Number: [id number] Patient Account Number: [id number] Date  of Birth/Sex: Jan 04, 1931 (83 y.o. 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 Facility Procedures CPT4 Code Description: 32951884 11042 - DEB SUBQ TISSUE 20 SQ CM/< ICD-10 Description Diagnosis L89.620 Pressure ulcer of left heel, unstageable L89.610 Pressure ulcer of right heel, unstageable 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

## 2016-12-24 ENCOUNTER — Encounter: Payer: Medicare HMO | Admitting: Surgery

## 2016-12-24 DIAGNOSIS — L89620 Pressure ulcer of left heel, unstageable: Secondary | ICD-10-CM | POA: Diagnosis not present

## 2016-12-25 NOTE — Progress Notes (Signed)
KADINCE, BOXLEY ([id number]) Visit Report for 12/24/2016 Arrival Information Details Patient Name: [name], [name]. Date of Service: 12/24/2016 1:30 PM Medical Record Number: [id number] Patient Account Number: [id number] Date of Birth/Sex: 1930-11-23 (83 y.o. Female) Treating RN: Montey Hora Primary Care Yevette Knust: [name] Other Clinician: Referring Divine Imber: [name] Treating Raniya Golembeski/Extender: Frann Rider in Treatment: 15 Visit Information History Since Last Visit Added or deleted any medications: No Patient Arrived: Wheel Chair Any new allergies or adverse reactions: No Arrival Time: 14:45 Had a fall or experienced change in No activities of daily living that may affect Accompanied By: self risk of falls: Transfer Assistance: Hoyer Lift Signs or symptoms of abuse/neglect since last No Patient Identification Verified: Yes visito Secondary Verification Process Yes Hospitalized since last visit: No Completed: Has Dressing in Place as Prescribed: Yes Patient Requires Transmission-Based No Pain Present Now: No Precautions: Patient Has Alerts: No Electronic Signature(s) Signed: 12/24/2016 5:14:48 PM By: Montey Hora Entered By: Montey Hora on 12/24/2016 14:46:08 [name] ([id number]) -------------------------------------------------------------------------------- Encounter Discharge Information Details Patient Name: [name]. Date of Service: 12/24/2016 1:30 PM Medical Record Number: [id number] Patient Account Number: [id number] Date of Birth/Sex: [birth year] (83 y.o. Female) Treating RN: Montey Hora Primary Care Toniann Dickerson: [name] Other Clinician: Referring Nayelli Inglis: [name] Treating Irbin Fines/Extender: Frann Rider in Treatment: 15 Encounter Discharge Information Items Discharge Pain Level: 0 Discharge Condition: Stable Ambulatory Status: Wheelchair Discharge Destination: Home Transportation:  Other Accompanied By: self Schedule Follow-up Appointment: Yes Medication Reconciliation completed No and provided to Patient/Care Lidya Mccalister: Provided on Clinical Summary of Care: 12/24/2016 Form Type Recipient Paper Patient EB Electronic Signature(s) Signed: 12/24/2016 3:17:48 PM By: Ruthine Dose Entered By: Ruthine Dose on 12/24/2016 15:17:48 [name] ([id number]) -------------------------------------------------------------------------------- Lower Extremity Assessment Details Patient Name: [name]. Date of Service: 12/24/2016 1:30 PM Medical Record Number: [id number] Patient Account Number: [id number] Date of Birth/Sex: [birth year] (83 y.o. Female) Treating RN: Montey Hora Primary Care Rebel Willcutt: [name] Other Clinician: Referring Zyliah Schier: [name] Treating Aliyanna Wassmer/Extender: Frann Rider in Treatment: 15 Vascular Assessment Pulses: Dorsalis Pedis Palpable: [Left:Yes] [Right:Yes] Posterior Tibial Extremity colors, hair growth, and conditions: Extremity Color: [Left:Hyperpigmented] [Right:Hyperpigmented] Temperature of Extremity: [Left:Warm] [Right:Warm] Electronic Signature(s) Signed: 12/24/2016 5:14:48 PM By: Montey Hora Entered By: Montey Hora on 12/24/2016 15:00:30 [name] ([id number]) -------------------------------------------------------------------------------- Multi Wound Chart Details Patient Name: [name]. Date of Service: 12/24/2016 1:30 PM Medical Record Number: [id number] Patient Account Number: [id number] Date of Birth/Sex: 08-Feb-1931 (83 y.o. Female) Treating RN: Montey Hora Primary Care Corion Sherrod: [name] Other Clinician: Referring Verneta Hamidi: [name] Treating Hoang Pettingill/Extender: Frann Rider in Treatment: 15 Vital Signs Height(in): 63 Pulse(bpm): 64 Weight(lbs): 160 Blood Pressure 123/53 (mmHg): Body Mass Index(BMI): 28 Temperature(F): 98.1 Respiratory  Rate 16 (breaths/min): Photos: [1:No Photos] [2:No Photos] [7:No Photos] Wound Location: [1:Right Calcaneus - Medial] [2:Left Calcaneus - Medial] [7:Left Gluteal fold] Wounding Event: [1:Pressure Injury] [2:Pressure Injury] [7:Pressure Injury] Primary Etiology: [1:Pressure Ulcer] [2:Pressure Ulcer] [7:Pressure Ulcer] Comorbid History: [1:Cataracts, Hypertension] [2:Cataracts, Hypertension] [7:Cataracts, Hypertension] Date Acquired: [1:07/02/2016] [2:07/02/2016] [7:12/24/2016] Weeks of Treatment: [1:15] [2:15] [7:0] Wound Status: [1:Open] [2:Open] [7:Open] Measurements L x W x D 1.9x2.5x0.2 [2:2.5x3.7x1] [7:2.5x1.5x0.2] (cm) Area (cm) : [1:3.731] [2:7.265] [7:2.945] Volume (cm) : [1:0.746] [2:7.265] [7:0.589] % Reduction in Area: [1:69.80%] [2:63.30%] [7:N/A] % Reduction in Volume: 39.70% [2:-267.10%] [7:N/A] Classification: [1:Category/Stage III] [2:Category/Stage III] [7:Category/Stage II] Exudate Amount: [1:Large] [2:Large] [7:Large] Exudate Type: [1:Serosanguineous] [2:Serosanguineous] [7:Serous] Exudate Color: [1:red, brown] [2:red, brown] [7:amber] Foul Odor After [1:Yes] [2:Yes] [7:No] Cleansing:  Odor Anticipated Due to No [2:No] [7:N/A] Product Use: Wound Margin: [1:Flat and Intact] [2:Flat and Intact] [7:Flat and Intact] Granulation Amount: [1:Medium (34-66%)] [2:Large (67-100%)] [7:Large (67-100%)] Granulation Quality: [1:Pink] [2:Pink] [7:Red] Necrotic Amount: [1:Medium (34-66%)] [2:Small (1-33%)] [7:None Present (0%)] Exposed Structures: [1:Fat Layer (Subcutaneous Tissue) Exposed: Yes] [2:Fat Layer (Subcutaneous Tissue) Exposed: Yes] [7:Fascia: No Fat Layer (Subcutaneous Tissue) Exposed: No Tendon: No] Muscle: No Joint: No Bone: No Limited to Skin Breakdown Epithelialization: None None None Debridement: N/A Debridement ([id number]) Pre-procedure N/A 15:00 N/A Verification/Time Out Taken: Pain Control: N/A Lidocaine 4% Topical N/A Solution Tissue  Debrided: N/A Fibrin/Slough, Exudates, N/A Subcutaneous Level: N/A Skin/Subcutaneous N/A Tissue Debridement Area (sq N/A 9.25 N/A cm): Instrument: N/A Curette N/A Bleeding: N/A Minimum N/A Hemostasis Achieved: N/A Pressure N/A Procedural Pain: N/A 0 N/A Post Procedural Pain: N/A 0 N/A Debridement Treatment N/A Procedure was tolerated N/A Response: well Post Debridement N/A 2.5x3.7x1 N/A Measurements L x W x D (cm) Post Debridement N/A 7.265 N/A Volume: (cm) Post Debridement N/A Category/Stage III N/A Stage: Periwound Skin Texture: Excoriation: Yes Excoriation: Yes Excoriation: No Induration: No Induration: No Induration: No Callus: No Callus: No Callus: No Crepitus: No Crepitus: No Crepitus: No Rash: No Rash: No Rash: No Scarring: No Scarring: No Scarring: No Periwound Skin Maceration: Yes Maceration: Yes Maceration: No Moisture: Dry/Scaly: No Dry/Scaly: No Dry/Scaly: No Periwound Skin Color: Atrophie Blanche: No Atrophie Blanche: No Atrophie Blanche: No Cyanosis: No Cyanosis: No Cyanosis: No Ecchymosis: No Ecchymosis: No Ecchymosis: No Erythema: No Erythema: No Erythema: No Hemosiderin Staining: No Hemosiderin Staining: No Hemosiderin Staining: No Mottled: No Mottled: No Mottled: No Pallor: No Pallor: No Pallor: No Rubor: No Rubor: No Rubor: No Temperature: No Abnormality No Abnormality No Abnormality Tregoning, Breanda S. ([id number]) Tenderness on Yes Yes Yes Palpation: Wound Preparation: Ulcer Cleansing: Ulcer Cleansing: Ulcer Cleansing: Rinsed/Irrigated with Rinsed/Irrigated with Rinsed/Irrigated with Saline Saline Saline Topical Anesthetic Topical Anesthetic Topical Anesthetic Applied: Other: lidocaine Applied: Other: Lidocaine Applied: Other: lidocaine 4% 4% 4% Procedures Performed: N/A Debridement N/A Treatment Notes Wound #1 (Right, Medial Calcaneus) 1. Cleansed with: Clean wound with Normal Saline 2. Anesthetic Topical Lidocaine  4% cream to wound bed prior to debridement 4. Dressing Applied: Other dressing (specify in notes) 5. Secondary Dressing Applied Dry Gauze Kerlix/Conform 7. Secured with Tape Notes endoform Wound #2 (Left, Medial Calcaneus) 1. Cleansed with: Clean wound with Normal Saline 2. Anesthetic Topical Lidocaine 4% cream to wound bed prior to debridement 4. Dressing Applied: Other dressing (specify in notes) 5. Secondary Dressing Applied Dry Gauze Kerlix/Conform 7. Secured with Tape Notes endoform Wound #7 (Left Gluteal fold) 1. Cleansed with: Clean wound with Normal Saline 2. Anesthetic DAYANNARA, PASCAL ([id number]) Topical Lidocaine 4% cream to wound bed prior to debridement 3. Peri-wound Care: Skin Prep 4. Dressing Applied: Aquacel Ag 5. Secondary Dressing Applied Bordered Foam Dressing Electronic Signature(s) Signed: 12/24/2016 3:05:58 PM By: Christin Fudge MD, FACS Entered By: Christin Fudge on 12/24/2016 15:05:58 [name] ([id number]) -------------------------------------------------------------------------------- [place] Details Patient Name: [name] Date of Service: 12/24/2016 1:30 PM Medical Record Number: [id number] Patient Account Number: [id number] Date of Birth/Sex: 06-08-1931 (83 y.o. Female) Treating RN: Montey Hora Primary Care Dura Mccormack: [name] Other Clinician: Referring Wilbert Hayashi: [name] Treating Yuval Nolet/Extender: Frann Rider in Treatment: 15 Active Inactive ` Abuse / Safety / Falls / Self Care Management Nursing Diagnoses: Impaired physical mobility Potential for falls Goals: Patient will remain injury free Date Initiated: 09/06/2016 Target Resolution Date: 12/02/2016 Goal Status:  Active Interventions: Assess fall risk on admission and as needed Assess self care needs on admission and as needed Notes: ` Necrotic Tissue Nursing Diagnoses: Impaired tissue integrity related to  necrotic/devitalized tissue Goals: Necrotic/devitalized tissue will be minimized in the wound bed Date Initiated: 09/06/2016 Target Resolution Date: 12/02/2016 Goal Status: Active Interventions: Assess patient pain level pre-, during and post procedure and prior to discharge Notes: ` Orientation to the Wound Care Program Nursing Diagnoses: MYLISSA, LAMBE ([id number]) Knowledge deficit related to the wound healing center program Goals: Patient/caregiver will verbalize understanding of the [place] Program Date Initiated: 09/06/2016 Target Resolution Date: 12/02/2016 Goal Status: Active Interventions: Provide education on orientation to the wound center Notes: ` Pressure Nursing Diagnoses: Knowledge deficit related to management of pressures ulcers Potential for impaired tissue integrity related to pressure, friction, moisture, and shear Goals: Patient will remain free of pressure ulcers Date Initiated: 09/06/2016 Target Resolution Date: 12/02/2016 Goal Status: Active Interventions: Assess: immobility, friction, shearing, incontinence upon admission and as needed Notes: ` Soft Tissue Infection Nursing Diagnoses: Impaired tissue integrity Potential for infection: soft tissue Goals: Patient will remain free of wound infection Date Initiated: 09/06/2016 Target Resolution Date: 12/02/2016 Goal Status: Active Interventions: Assess signs and symptoms of infection every visit Notes: ` Wound/Skin Impairment MARIGENE, ERLER ([id number]) Nursing Diagnoses: Impaired tissue integrity Goals: Ulcer/skin breakdown will heal within 14 weeks Date Initiated: 09/06/2016 Target Resolution Date: 12/16/2016 Goal Status: Active Interventions: Assess ulceration(s) every visit Notes: Electronic Signature(s) Signed: 12/24/2016 5:14:48 PM By: Montey Hora Entered By: Montey Hora on 12/24/2016 14:51:23 [name]  ([id number]) -------------------------------------------------------------------------------- Pain Assessment Details Patient Name: [name]. Date of Service: 12/24/2016 1:30 PM Medical Record Number: [id number] Patient Account Number: [id number] Date of Birth/Sex: [birth year] (83 y.o. Female) Treating RN: Montey Hora Primary Care Jaslene Marsteller: [name] Other Clinician: Referring Izaias Krupka: [name] Treating Thaddaeus Granja/Extender: Frann Rider in Treatment: 15 Active Problems Location of Pain Severity and Description of Pain Patient Has Paino No Site Locations Pain Management and Medication Current Pain Management: Electronic Signature(s) Signed: 12/24/2016 5:14:48 PM By: Montey Hora Entered By: Montey Hora on 12/24/2016 14:46:27 [name] ([id number]) -------------------------------------------------------------------------------- Patient/Caregiver Education Details Patient Name: [name]. Date of Service: 12/24/2016 1:30 PM Medical Record Number: [id number] Patient Account Number: [id number] Date of Birth/Gender: 07-Apr-1931 (83 y.o. Female) Treating RN: Montey Hora Primary Care Physician: [name] Other Clinician: Referring Physician: [name] Treating Physician/Extender: Frann Rider in Treatment: 15 Education Assessment Education Provided To: Patient Education Topics Provided Wound/Skin Impairment: Handouts: Other: change dressing as ordered Methods: Demonstration, Explain/Verbal Responses: State content correctly Electronic Signature(s) Signed: 12/24/2016 5:14:48 PM By: Montey Hora Entered By: Montey Hora on 12/24/2016 14:51:15 [name] ([id number]) -------------------------------------------------------------------------------- Wound Assessment Details Patient Name: [name]. Date of Service: 12/24/2016 1:30 PM Medical Record Number: [id number] Patient Account Number: [id number] Date of  Birth/Sex: Jan 03, 1931 (83 y.o. Female) Treating RN: Montey Hora Primary Care Lorrayne Ismael: [name] Other Clinician: Referring Jayli Fogleman: [name] Treating Aaron Boeh/Extender: Frann Rider in Treatment: 15 Wound Status Wound Number: 1 Primary Etiology: Pressure Ulcer Wound Location: Right Calcaneus - Medial Wound Status: Open Wounding Event: Pressure Injury Comorbid History: Cataracts, Hypertension Date Acquired: 07/02/2016 Weeks Of Treatment: 15 Clustered Wound: No Wound Measurements Length: (cm) 1.9 Width: (cm) 2.5 Depth: (cm) 0.2 Area: (cm) 3.731 Volume: (cm) 0.746 % Reduction in Area: 69.8% % Reduction in Volume: 39.7% Epithelialization: None Tunneling: No Undermining: No Wound Description Classification: Category/Stage III Wound Margin: Flat and Intact  Exudate Amount: Large Exudate Type: Serosanguineous Exudate Color: red, brown Foul Odor After Cleansing: Yes Due to Product Use: No Wound Bed Granulation Amount: Medium (34-66%) Exposed Structure Granulation Quality: Pink Fat Layer (Subcutaneous Tissue) Exposed: Yes Necrotic Amount: Medium (34-66%) Necrotic Quality: Adherent Slough Periwound Skin Texture Texture Color No Abnormalities Noted: No No Abnormalities Noted: No Callus: No Atrophie Blanche: No Crepitus: No Cyanosis: No Excoriation: Yes Ecchymosis: No Induration: No Erythema: No Rash: No Hemosiderin Staining: No Scarring: No Mottled: No Pallor: No Moisture Rubor: No No Abnormalities Noted: No JAVEN, RIDINGS. ([id number]) Dry / Scaly: No Temperature / Pain Maceration: Yes Temperature: No Abnormality Tenderness on Palpation: Yes Wound Preparation Ulcer Cleansing: Rinsed/Irrigated with Saline Topical Anesthetic Applied: Other: lidocaine 4%, Treatment Notes Wound #1 (Right, Medial Calcaneus) 1. Cleansed with: Clean wound with Normal Saline 2. Anesthetic Topical Lidocaine 4% cream to wound bed prior to  debridement 4. Dressing Applied: Other dressing (specify in notes) 5. Secondary Dressing Applied Dry Gauze Kerlix/Conform 7. Secured with Tape Notes endoform Electronic Signature(s) Signed: 12/24/2016 5:14:48 PM By: Montey Hora Entered By: Montey Hora on 12/24/2016 14:56:37 [name] ([id number]) -------------------------------------------------------------------------------- Wound Assessment Details Patient Name: [name]. Date of Service: 12/24/2016 1:30 PM Medical Record Number: [id number] Patient Account Number: [id number] Date of Birth/Sex: Jan 16, 1931 (83 y.o. Female) Treating RN: Montey Hora Primary Care Trenell Moxey: [name] Other Clinician: Referring Vinal Rosengrant: [name] Treating Cheryel Kyte/Extender: Frann Rider in Treatment: 15 Wound Status Wound Number: 2 Primary Etiology: Pressure Ulcer Wound Location: Left Calcaneus - Medial Wound Status: Open Wounding Event: Pressure Injury Comorbid History: Cataracts, Hypertension Date Acquired: 07/02/2016 Weeks Of Treatment: 15 Clustered Wound: No Wound Measurements Length: (cm) 2.5 Width: (cm) 3.7 Depth: (cm) 1 Area: (cm) 7.265 Volume: (cm) 7.265 % Reduction in Area: 63.3% % Reduction in Volume: -267.1% Epithelialization: None Tunneling: No Undermining: No Wound Description Classification: Category/Stage III Wound Margin: Flat and Intact Exudate Amount: Large Exudate Type: Serosanguineous Exudate Color: red, brown Foul Odor After Cleansing: Yes Due to Product Use: No Slough/Fibrino Yes Wound Bed Granulation Amount: Large (67-100%) Exposed Structure Granulation Quality: Pink Fat Layer (Subcutaneous Tissue) Exposed: Yes Necrotic Amount: Small (1-33%) Necrotic Quality: Adherent Slough Periwound Skin Texture Texture Color No Abnormalities Noted: No No Abnormalities Noted: No Callus: No Atrophie Blanche: No Crepitus: No Cyanosis: No Excoriation: Yes Ecchymosis:  No Induration: No Erythema: No Rash: No Hemosiderin Staining: No Scarring: No Mottled: No Pallor: No Moisture Rubor: No No Abnormalities Noted: No HONESTII, MARTON. ([id number]) Dry / Scaly: No Temperature / Pain Maceration: Yes Temperature: No Abnormality Tenderness on Palpation: Yes Wound Preparation Ulcer Cleansing: Rinsed/Irrigated with Saline Topical Anesthetic Applied: Other: Lidocaine 4%, Treatment Notes Wound #2 (Left, Medial Calcaneus) 1. Cleansed with: Clean wound with Normal Saline 2. Anesthetic Topical Lidocaine 4% cream to wound bed prior to debridement 4. Dressing Applied: Other dressing (specify in notes) 5. Secondary Dressing Applied Dry Gauze Kerlix/Conform 7. Secured with Tape Notes endoform Electronic Signature(s) Signed: 12/24/2016 5:14:48 PM By: Montey Hora Entered By: Montey Hora on 12/24/2016 14:57:24 [name] ([id number]) -------------------------------------------------------------------------------- Wound Assessment Details Patient Name: [name]. Date of Service: 12/24/2016 1:30 PM Medical Record Number: [id number] Patient Account Number: [id number] Date of Birth/Sex: 1931-09-12 (83 y.o. Female) Treating RN: Montey Hora Primary Care Lynnzie Blackson: [name] Other Clinician: Referring Elic Vencill: [name] Treating Cleola Perryman/Extender: Frann Rider in Treatment: 15 Wound Status Wound Number: 7 Primary Etiology: Pressure Ulcer Wound Location: Left Gluteal fold Wound Status: Open Wounding Event: Pressure Injury Comorbid History:  Cataracts, Hypertension Date Acquired: 12/24/2016 Weeks Of Treatment: 0 Clustered Wound: No Wound Measurements Length: (cm) 2.5 Width: (cm) 1.5 Depth: (cm) 0.2 Area: (cm) 2.945 Volume: (cm) 0.589 % Reduction in Area: % Reduction in Volume: Epithelialization: None Tunneling: No Undermining: No Wound Description Classification: Category/Stage II Wound Margin: Flat and  Intact Exudate Amount: Large Exudate Type: Serous Exudate Color: amber Foul Odor After Cleansing: No Slough/Fibrino No Wound Bed Granulation Amount: Large (67-100%) Exposed Structure Granulation Quality: Red Fascia Exposed: No Necrotic Amount: None Present (0%) Fat Layer (Subcutaneous Tissue) Exposed: No Tendon Exposed: No Muscle Exposed: No Joint Exposed: No Bone Exposed: No Limited to Skin Breakdown Periwound Skin Texture Texture Color No Abnormalities Noted: No No Abnormalities Noted: No Callus: No Atrophie Blanche: No Crepitus: No Cyanosis: No Excoriation: No Ecchymosis: No Induration: No Erythema: No GENESE, QUEBEDEAUX. ([id number]) Rash: No Hemosiderin Staining: No Scarring: No Mottled: No Pallor: No Moisture Rubor: No No Abnormalities Noted: No Dry / Scaly: No Temperature / Pain Maceration: No Temperature: No Abnormality Tenderness on Palpation: Yes Wound Preparation Ulcer Cleansing: Rinsed/Irrigated with Saline Topical Anesthetic Applied: Other: lidocaine 4%, Treatment Notes Wound #7 (Left Gluteal fold) 1. Cleansed with: Clean wound with Normal Saline 2. Anesthetic Topical Lidocaine 4% cream to wound bed prior to debridement 3. Peri-wound Care: Skin Prep 4. Dressing Applied: Aquacel Ag 5. Secondary Dressing Applied Bordered Foam Dressing Electronic Signature(s) Signed: 12/24/2016 5:14:48 PM By: Montey Hora Entered By: Montey Hora on 12/24/2016 14:55:50 [name] ([id number]) -------------------------------------------------------------------------------- [place] Details Patient Name: [name] Date of Service: 12/24/2016 1:30 PM Medical Record Number: [id number] Patient Account Number: [id number] Date of Birth/Sex: 1931/06/29 (83 y.o. Female) Treating RN: Montey Hora Primary Care Yuritza Paulhus: [name] Other Clinician: Referring Lalena Salas: [name] Treating Hilari Wethington/Extender: Frann Rider in Treatment:  15 Vital Signs Time Taken: 14:46 Temperature (F): 98.1 Height (in): 63 Pulse (bpm): 64 Weight (lbs): 160 Respiratory Rate (breaths/min): 16 Body Mass Index (BMI): 28.3 Blood Pressure (mmHg): 123/53 Reference Range: 80 - 120 mg / dl Electronic Signature(s) Signed: 12/24/2016 5:14:48 PM By: Montey Hora Entered By: Montey Hora on 12/24/2016 14:46:48

## 2016-12-25 NOTE — Progress Notes (Signed)
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 procedure was tolerated well with a pain level of 0 throughout and a pain level of 0 following the procedure. Post Debridement Measurements: 2.5cm length x 3.7cm width x 1cm depth; 7.265cm^3 volume. Post debridement Stage noted as Category/Stage III. Character of Wound/Ulcer Post Debridement requires further debridement. Severity of Tissue Post Debridement is: Fat layer exposed. Post procedure Diagnosis Wound #2: Same as Pre-Procedure Plan Wound Cleansing: Wound #1 Right,Medial Calcaneus: Clean wound with Normal Saline. Wound #7 Left Gluteal fold: Clean wound with Normal Saline. Wound #2 Left,Medial Calcaneus: Clean wound with Normal Saline. Anesthetic: Wound #1 Right,Medial Calcaneus: Topical Lidocaine 4% cream applied to wound bed prior to debridement - in clinic only Wound  #7 Left Gluteal fold: Topical Lidocaine 4% cream applied to wound bed prior to debridement - in clinic only Wound #2 Left,Medial Calcaneus: Topical Lidocaine 4% cream applied to wound bed prior to debridement - in clinic only Skin Barriers/Peri-Wound Care: Wound #7 Left Gluteal fold: Skin Prep Primary Wound Dressing: Wound #1 Right,Medial Calcaneus: Other: - endoform - HH to provide this for patient to use Wound #2 Left,Medial Calcaneus: Other: - endoform - HH to provide this for patient to use Wound #7 Left Gluteal fold: Aquacel Ag Secondary Dressing: [name] ([id number]) Wound #1 Right,Medial Calcaneus: Dry Gauze - Foam on tops of feet due to reddened areas. Conform/Kerlix Other - heel cups or ABD pads to support offloading Wound #2 Left,Medial Calcaneus: Dry Gauze - Foam on tops of feet due to reddened areas. Conform/Kerlix Other - heel cups/ABD pads for offloading support Wound #7 Left Gluteal fold: Boardered Foam Dressing Dressing Change Frequency: Wound #1 Right,Medial Calcaneus: Change Dressing Monday, Wednesday, Friday Three times weekly - [facility] Wound #7 Left Gluteal fold: Change Dressing Monday, Wednesday, Friday Three times weekly - [facility] Wound #2 Left,Medial Calcaneus: Change Dressing Monday, Wednesday, Friday Three times weekly - [facility] Follow-up Appointments: Wound #1 Right,Medial Calcaneus: Return Appointment in 1 week. Wound #7 Left Gluteal fold: Return Appointment in 1 week. Wound #2 Left,Medial Calcaneus: Return Appointment in 1 week. Off-Loading: Wound #1 Right,Medial Calcaneus: Other: - float heels when in bed; keep pressure off during the day. Sage Boots at night. Wound #7 Left Gluteal fold: Other: - float heels when in bed; keep pressure off during the day. Sage Boots at night. Wound #2 Left,Medial Calcaneus: Other: - float heels when in bed; keep pressure off during the day. Sage Boots at night. Home Health: Wound #1 Right,Medial  Calcaneus: Continue Home Health Visits - [place] Nurse may visit PRN to address patient s wound care needs. FACE TO FACE ENCOUNTER: MEDICARE and MEDICAID PATIENTS: I certify that this patient is under my care and that I had a face-to-face encounter that meets the physician face-to-face encounter requirements with this patient on this date. The encounter with the patient was in whole or in part for the following MEDICAL CONDITION: (primary reason for [place]) MEDICAL NECESSITY: I certify, that based on my findings, NURSING services are a medically necessary home health service. HOME BOUND STATUS: I certify that my clinical findings support that this patient is homebound (i.e., Due to illness or injury, pt requires aid of supportive devices such as crutches, cane, wheelchairs, walkers, the use of special transportation or the assistance of another person to leave their place of residence. There is a normal inability to leave the home and doing so requires considerable and  taxing effort. Other absences are for medical reasons / religious services and are infrequent or of short duration when for other reasons). If current dressing causes regression in wound condition, may D/C ordered dressing product/s and apply Normal Saline Moist Dressing daily until next [place] / Other MD appointment. [place] of regression in wound condition at [phone]. DALAYSIA, HARMS ([id number]) Please direct any NON-WOUND related issues/requests for orders to patient's Primary Care Physician Wound #7 Left Gluteal fold: [place] Visits - [place] Nurse may visit PRN to address patient s wound care needs. FACE TO FACE ENCOUNTER: MEDICARE and MEDICAID PATIENTS: I certify that this patient is under my care and that I had a face-to-face encounter that meets the physician face-to-face encounter requirements with this patient on this date. The  encounter with the patient was in whole or in part for the following MEDICAL CONDITION: (primary reason for [place]) MEDICAL NECESSITY: I certify, that based on my findings, NURSING services are a medically necessary home health service. HOME BOUND STATUS: I certify that my clinical findings support that this patient is homebound (i.e., Due to illness or injury, pt requires aid of supportive devices such as crutches, cane, wheelchairs, walkers, the use of special transportation or the assistance of another person to leave their place of residence. There is a normal inability to leave the home and doing so requires considerable and taxing effort. Other absences are for medical reasons / religious services and are infrequent or of short duration when for other reasons). If current dressing causes regression in wound condition, may D/C ordered dressing product/s and apply Normal Saline Moist Dressing daily until next [place] / Other MD appointment. [place] of regression in wound condition at [phone]. Please direct any NON-WOUND related issues/requests for orders to patient's Primary Care Physician Wound #2 Left,Medial Calcaneus: [place] Visits - [place] Nurse may visit PRN to address patient s wound care needs. FACE TO FACE ENCOUNTER: MEDICARE and MEDICAID PATIENTS: I certify that this patient is under my care and that I had a face-to-face encounter that meets the physician face-to-face encounter requirements with this patient on this date. The encounter with the patient was in whole or in part for the following MEDICAL CONDITION: (primary reason for [place]) MEDICAL NECESSITY: I certify, that based on my findings, NURSING services are a medically necessary home health service. HOME BOUND STATUS: I certify that my clinical findings support that this patient is homebound (i.e., Due to illness or injury, pt requires aid  of supportive devices such as crutches, cane, wheelchairs, walkers, the use of special transportation or the assistance of another person to leave their place of residence. There is a normal inability to leave the home and doing so requires considerable and taxing effort. Other absences are for medical reasons / religious services and are infrequent or of short duration when for other reasons). If current dressing causes regression in wound condition, may D/C ordered dressing product/s and apply Normal Saline Moist Dressing daily until next [place] / Other MD appointment. [place] of regression in wound condition at [phone]. Please direct any NON-WOUND related issues/requests for orders to patient's Primary Care Physician Authotization for a skin substitute has not yet been approved. In the meanwhile I have recommended: 1. Endoform to both heels, heel cups and offloading with Sage boots. 2. Adequate proteins, vitamin A, vitamin C and zinc. 3. Silver alginate  and a bordered foam to the left gluteal region. 4. Offloading has been stressed with her again in great detail especially about change in position while sitting and sleeping JESYCA, [place] ([id number]) Electronic Signature(s) Signed: 12/24/2016 3:08:05 PM By: Christin Fudge MD, FACS Entered By: Christin Fudge on 12/24/2016 15:08:05 [name] ([id number]) -------------------------------------------------------------------------------- SuperBill Details Patient Name: [name]. Date of Service: 12/24/2016 Medical Record Number: [id number] Patient Account Number: [id number] Date of Birth/Sex: October 11, 1931 (83 y.o. Female) Treating RN: Afful, RN, BSN, [place] Primary Care Provider: [name] Other Clinician: Referring Provider: [name] Treating Provider/Extender: Christin Fudge Service Line: Outpatient Weeks in Treatment: 15 Diagnosis Coding ICD-10 Codes Code Description L89.620  Pressure ulcer of left heel, unstageable L89.610 Pressure ulcer of right heel, unstageable L97.512 Non-pressure chronic ulcer of other part of right foot with fat layer exposed Z99.3 Dependence on wheelchair L89.322 Pressure ulcer of left buttock, stage 2 Facility Procedures CPT4 Code: [id number] Description: 11042 - DEB SUBQ TISSUE 20 SQ CM/< ICD-10 Description Diagnosis L89.620 Pressure ulcer of left heel, unstageable L89.610 Pressure ulcer of right heel, unstageable L89.322 Pressure ulcer of left buttock, stage 2 Z99.3 Dependence on wheelchair Modifier: Quantity: 1 Physician Procedures CPT4 Code Description: 8381840 99213 - WC PHYS LEVEL 3 - EST PT ICD-10 Description Diagnosis L89.620 Pressure ulcer of left heel, unstageable L89.610 Pressure ulcer of right heel, unstageable L97.512 Non-pressure chronic ulcer of other part of right foo  L89.322 Pressure ulcer of left buttock, stage 2 Modifier: 25 t with fat lay Quantity: 1 er exposed CPT4 Code Description: [id number] 11042 - WC PHYS SUBQ TISS 20 SQ CM ICD-10 Description Diagnosis L89.620 Pressure ulcer of left heel, unstageable L89.610 Pressure ulcer of right heel, KODA, DEFRANK ([id number]) Modifier: Quantity: 1 Electronic Signature(s) Signed: 12/24/2016 3:08:36 PM By: Christin Fudge MD, FACS Entered By: Christin Fudge on 12/24/2016 15:08:36

## 2016-12-30 ENCOUNTER — Ambulatory Visit (INDEPENDENT_AMBULATORY_CARE_PROVIDER_SITE_OTHER): Payer: Medicare HMO | Admitting: Urology

## 2016-12-30 ENCOUNTER — Encounter: Payer: Self-pay | Admitting: Urology

## 2016-12-30 VITALS — BP 103/52 | HR 56

## 2016-12-30 DIAGNOSIS — R339 Retention of urine, unspecified: Secondary | ICD-10-CM | POA: Diagnosis not present

## 2016-12-30 DIAGNOSIS — N183 Chronic kidney disease, stage 3 unspecified: Secondary | ICD-10-CM

## 2016-12-30 DIAGNOSIS — N390 Urinary tract infection, site not specified: Secondary | ICD-10-CM | POA: Diagnosis not present
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
## 2016-12-31 ENCOUNTER — Encounter: Payer: Medicare HMO | Admitting: Surgery

## 2016-12-31 DIAGNOSIS — L89620 Pressure ulcer of left heel, unstageable: Secondary | ICD-10-CM | POA: Diagnosis not present

## 2017-01-01 NOTE — Progress Notes (Addendum)
KEANDRA, MEDERO ([id number]) Visit Report for 12/31/2016 Arrival Information Details Patient Name: [name], [name]. Date of Service: 12/31/2016 3:00 PM Medical Record Number: [id number] Patient Account Number: [id number] Date of Birth/Sex: 11-Feb-1931 (83 y.o. Female) Treating RN: Afful, RN, BSN, Velva Harman Primary Care Miku Udall: [name] Other Clinician: Referring Tyashia Morrisette: [name] Treating Lekia Nier/Extender: Frann Rider in Treatment: 16 Visit Information History Since Last Visit All ordered tests and consults were completed: No Patient Arrived: Wheel Chair Added or deleted any medications: No Arrival Time: 14:55 Any new allergies or adverse reactions: No Accompanied By: self Had a fall or experienced change in No activities of daily living that may affect Transfer Assistance: Harrel Lemon Lift risk of falls: Patient Identification Verified: Yes Signs or symptoms of abuse/neglect since last No Secondary Verification Process Yes visito Completed: Hospitalized since last visit: No Patient Requires Transmission-Based No Has Dressing in Place as Prescribed: Yes Precautions: Pain Present Now: Yes Patient Has Alerts: No Electronic Signature(s) Signed: 12/31/2016 2:55:29 PM By: Regan Lemming BSN, RN Entered By: Regan Lemming on 12/31/2016 14:55:29 [name] ([id number]) -------------------------------------------------------------------------------- Encounter Discharge Information Details Patient Name: [name]. Date of Service: 12/31/2016 3:00 PM Medical Record Number: [id number] Patient Account Number: [id number] Date of Birth/Sex: 10-26-1930 (83 y.o. Female) Treating RN: Afful, RN, BSN, Velva Harman Primary Care Lazarus Sudbury: [name] Other Clinician: Referring Micheal Murad: [name] Treating Alphonsus Doyel/Extender: Frann Rider in Treatment: 16 Encounter Discharge Information Items Discharge Pain Level: 0 Discharge Condition: Stable Ambulatory Status:  Wheelchair Discharge Destination: Home Transportation: Other Accompanied By: self Schedule Follow-up Appointment: No Medication Reconciliation completed and No provided to Patient/Care Forrestine Lecrone: Clinical Summary of Care: Electronic Signature(s) Signed: 12/31/2016 4:40:53 PM By: Regan Lemming BSN, RN Previous Signature: 12/31/2016 3:43:05 PM Version By: Ruthine Dose Entered By: Regan Lemming on 12/31/2016 15:43:24 [name] ([id number]) -------------------------------------------------------------------------------- Lower Extremity Assessment Details Patient Name: [name]. Date of Service: 12/31/2016 3:00 PM Medical Record Number: [id number] Patient Account Number: [id number] Date of Birth/Sex: May 06, 1931 (83 y.o. Female) Treating RN: Afful, RN, BSN, Velva Harman Primary Care Mysha Peeler: [name] Other Clinician: Referring Venecia Mehl: [name] Treating Galina Haddox/Extender: Frann Rider in Treatment: 16 Vascular Assessment Pulses: Dorsalis Pedis Palpable: [Left:Yes] [Right:Yes] Posterior Tibial Extremity colors, hair growth, and conditions: Extremity Color: [Left:Hyperpigmented] [Right:Hyperpigmented] Hair Growth on Extremity: [Left:No] [Right:No] Temperature of Extremity: [Left:Warm] Capillary Refill: [Left:< 3 seconds] [Right:< 3 seconds] Toe Nail Assessment Left: Right: Thick: Yes Yes Discolored: Yes Yes Deformed: Yes Yes Improper Length and Hygiene: Yes Yes Electronic Signature(s) Signed: 12/31/2016 4:40:53 PM By: Regan Lemming BSN, RN Entered By: Regan Lemming on 12/31/2016 15:07:16 [name] ([id number]) -------------------------------------------------------------------------------- Multi Wound Chart Details Patient Name: [name]. Date of Service: 12/31/2016 3:00 PM Medical Record Number: [id number] Patient Account Number: [id number] Date of Birth/Sex: [birth year] (83 y.o. Female) Treating RN: Baruch Gouty, RN, BSN, Velva Harman Primary Care Surie Suchocki:  [name] Other Clinician: Referring Amando Ishikawa: [name] Treating Cyara Devoto/Extender: Frann Rider in Treatment: 16 Vital Signs Height(in): 63 Pulse(bpm): 78 Weight(lbs): 160 Blood Pressure 122/67 (mmHg): Body Mass Index(BMI): 28 Temperature(F): 98.1 Respiratory Rate 16 (breaths/min): Photos: [1:No Photos] [2:No Photos] [7:No Photos] Wound Location: [1:Right Calcaneus - Medial] [2:Left Calcaneus - Medial] [7:Left Gluteal fold] Wounding Event: [1:Pressure Injury] [2:Pressure Injury] [7:Pressure Injury] Primary Etiology: [1:Pressure Ulcer] [2:Pressure Ulcer] [7:Pressure Ulcer] Comorbid History: [1:Cataracts, Hypertension] [2:Cataracts, Hypertension] [7:Cataracts, Hypertension] Date Acquired: [1:07/02/2016] [2:07/02/2016] [7:12/24/2016] Weeks of Treatment: [1:16] [2:16] [7:1] Wound Status: [1:Open] [2:Open] [7:Open] Measurements L x W x D 1x2x0.2 [2:2x4x1] [7:1x2x0.1] (cm) Area (cm) : [  1:1.571] [2:6.283] [7:1.571] Volume (cm) : [1:0.314] [2:6.283] [7:0.157] % Reduction in Area: [1:87.30%] [2:68.30%] [7:46.70%] % Reduction in Volume: 74.60% [2:-217.50%] [7:73.30%] Classification: [1:Category/Stage III] [2:Category/Stage III] [7:Category/Stage II] Exudate Amount: [1:Large] [2:Large] [7:Large] Exudate Type: [1:Serosanguineous] [2:Serosanguineous] [7:Serous] Exudate Color: [1:red, brown] [2:red, brown] [7:amber] Foul Odor After [1:Yes] [2:Yes] [7:No] Cleansing: Odor Anticipated Due to No [2:No] [7:N/A] Product Use: Wound Margin: [1:Flat and Intact] [2:Flat and Intact] [7:Flat and Intact] Granulation Amount: [1:Medium (34-66%)] [2:Medium (34-66%)] [7:Medium (34-66%)] Granulation Quality: [1:Pink] [2:Pink] [7:Red] Necrotic Amount: [1:Medium (34-66%)] [2:Medium (34-66%)] [7:Medium (34-66%)] Exposed Structures: [1:Fat Layer (Subcutaneous Tissue) Exposed: Yes] [2:Fat Layer (Subcutaneous Tissue) Exposed: Yes] [7:Fat Layer (Subcutaneous Tissue) Exposed: Yes  Fascia: No Tendon: No] Muscle: No Joint: No Bone: No Epithelialization: Small (1-33%) None None Debridement: N/A Debridement ([id number]- Debridement ([id number]) 11047) Pre-procedure N/A 15:20 15:20 Verification/Time Out Taken: Pain Control: N/A Lidocaine 4% Topical Lidocaine 4% Topical Solution Solution Tissue Debrided: N/A Fibrin/Slough, Fibrin/Slough, Subcutaneous Subcutaneous Level: N/A Skin/Subcutaneous Skin/Subcutaneous Tissue Tissue Debridement Area (sq N/A 8 2 cm): Instrument: N/A Curette Curette Bleeding: N/A Minimum Minimum Hemostasis Achieved: N/A Pressure Pressure Procedural Pain: N/A 0 0 Post Procedural Pain: N/A 0 0 Debridement Treatment N/A Procedure was tolerated Procedure was tolerated Response: well well Post Debridement N/A 2x4x1 1x2x0.1 Measurements L x W x D (cm) Post Debridement N/A 6.283 0.157 Volume: (cm) Post Debridement N/A Category/Stage III Category/Stage II Stage: Periwound Skin Texture: Excoriation: Yes Excoriation: Yes Excoriation: No Induration: No Induration: No Induration: No Callus: No Callus: No Callus: No Crepitus: No Crepitus: No Crepitus: No Rash: No Rash: No Rash: No Scarring: No Scarring: No Scarring: No Periwound Skin Maceration: Yes Maceration: Yes Maceration: Yes Moisture: Dry/Scaly: No Dry/Scaly: No Dry/Scaly: No Periwound Skin Color: Atrophie Blanche: No Atrophie Blanche: No Atrophie Blanche: No Cyanosis: No Cyanosis: No Cyanosis: No Ecchymosis: No Ecchymosis: No Ecchymosis: No Erythema: No Erythema: No Erythema: No Hemosiderin Staining: No Hemosiderin Staining: No Hemosiderin Staining: No Mottled: No Mottled: No Mottled: No Pallor: No Pallor: No Pallor: No Rubor: No Rubor: No Rubor: No Temperature: No Abnormality No Abnormality No Abnormality Tenderness on Yes Yes Yes Palpation: [name], [name] ([id number]) Wound Preparation: Ulcer Cleansing: Ulcer Cleansing: Ulcer  Cleansing: Rinsed/Irrigated with Rinsed/Irrigated with Rinsed/Irrigated with Saline Saline Saline Topical Anesthetic Topical Anesthetic Topical Anesthetic Applied: Other: lidocaine Applied: Other: Lidocaine Applied: Other: lidocaine 4% 4% 4% Procedures Performed: N/A Debridement Debridement Treatment Notes Electronic Signature(s) Signed: 12/31/2016 3:26:07 PM By: Christin Fudge MD, FACS Entered By: Christin Fudge on 12/31/2016 15:26:07 [name] ([id number]) -------------------------------------------------------------------------------- [place] Details Patient Name: [name] Date of Service: 12/31/2016 3:00 PM Medical Record Number: [id number] Patient Account Number: [id number] Date of Birth/Sex: Mar 05, 1931 (83 y.o. Female) Treating RN: Afful, RN, BSN, Velva Harman Primary Care Lamount Bankson: [name] Other Clinician: Referring Makenah Karas: [name] Treating Lida Berkery/Extender: Frann Rider in Treatment: 16 Active Inactive ` Abuse / Safety / Falls / Self Care Management Nursing Diagnoses: Impaired physical mobility Potential for falls Goals: Patient will remain injury free Date Initiated: 09/06/2016 Target Resolution Date: 12/02/2016 Goal Status: Active Interventions: Assess fall risk on admission and as needed Assess self care needs on admission and as needed Notes: ` Necrotic Tissue Nursing Diagnoses: Impaired tissue integrity related to necrotic/devitalized tissue Goals: Necrotic/devitalized tissue will be minimized in the wound bed Date Initiated: 09/06/2016 Target Resolution Date: 12/02/2016 Goal Status: Active Interventions: Assess patient pain level pre-, during and post procedure and prior to discharge Notes: ` Orientation to the Wound Care Program Nursing Diagnoses:  [name], [name] ([id number]) Knowledge deficit related to the wound healing center program Goals: Patient/caregiver will verbalize understanding of the  [place] Date Initiated: 09/06/2016 Target Resolution Date: 12/02/2016 Goal Status: Active Interventions: Provide education on orientation to the wound center Notes: ` Pressure Nursing Diagnoses: Knowledge deficit related to management of pressures ulcers Potential for impaired tissue integrity related to pressure, friction, moisture, and shear Goals: Patient will remain free of pressure ulcers Date Initiated: 09/06/2016 Target Resolution Date: 12/02/2016 Goal Status: Active Interventions: Assess: immobility, friction, shearing, incontinence upon admission and as needed Notes: ` Soft Tissue Infection Nursing Diagnoses: Impaired tissue integrity Potential for infection: soft tissue Goals: Patient will remain free of wound infection Date Initiated: 09/06/2016 Target Resolution Date: 12/02/2016 Goal Status: Active Interventions: Assess signs and symptoms of infection every visit Notes: ` Wound/Skin Impairment [name], [name] ([id number]) Nursing Diagnoses: Impaired tissue integrity Goals: Ulcer/skin breakdown will heal within 14 weeks Date Initiated: 09/06/2016 Target Resolution Date: 12/16/2016 Goal Status: Active Interventions: Assess ulceration(s) every visit Notes: Electronic Signature(s) Signed: 12/31/2016 4:40:53 PM By: Regan Lemming BSN, RN Entered By: Regan Lemming on 12/31/2016 15:22:37 [name] ([id number]) -------------------------------------------------------------------------------- Pain Assessment Details Patient Name: [name]. Date of Service: 12/31/2016 3:00 PM Medical Record Number: [id number] Patient Account Number: [id number] Date of Birth/Sex: October 20, 1930 (83 y.o. Female) Treating RN: Afful, RN, BSN, Velva Harman Primary Care Mykela Mewborn: [name] Other Clinician: Referring Tyera Hansley: [name] Treating Patrica Mendell/Extender: Frann Rider in Treatment: 16 Active Problems Location of Pain Severity and Description  of Pain Patient Has Paino Yes Site Locations Pain Location: Pain in Ulcers Rate the pain. Current Pain Level: 4 Character of Pain Describe the Pain: Aching, Tender Pain Management and Medication Current Pain Management: How does your wound impact your activities of daily livingo Sleep: Yes Bathing: Yes Appetite: Yes Relationship With Others: Yes Bladder Continence: Yes Emotions: Yes Bowel Continence: Yes Work: Yes Toileting: Yes Drive: Yes Dressing: Yes Hobbies: Yes Electronic Signature(s) Signed: 12/31/2016 2:55:44 PM By: Regan Lemming BSN, RN Entered By: Regan Lemming on 12/31/2016 14:55:43 [name] ([id number]) -------------------------------------------------------------------------------- Patient/Caregiver Education Details Patient Name: [name] Date of Service: 12/31/2016 3:00 PM Medical Record Number: [id number] Patient Account Number: [id number] Date of Birth/Gender: 11-24-1930 (83 y.o. Female) Treating RN: Baruch Gouty, RN, BSN, Velva Harman Primary Care Physician: [name] Other Clinician: Referring Physician: [name] Treating Physician/Extender: Frann Rider in Treatment: 16 Education Assessment Education Provided To: Patient Education Topics Provided Welcome To The [place]: Methods: Explain/Verbal Responses: State content correctly Wound Debridement: Methods: Explain/Verbal Responses: Reinforcements needed Wound/Skin Impairment: Methods: Explain/Verbal Responses: Reinforcements needed Electronic Signature(s) Signed: 12/31/2016 4:40:53 PM By: Regan Lemming BSN, RN Entered By: Regan Lemming on 12/31/2016 15:43:46 [name] ([id number]) -------------------------------------------------------------------------------- Wound Assessment Details Patient Name: [name]. Date of Service: 12/31/2016 3:00 PM Medical Record Number: [id number] Patient Account Number: [id number] Date of Birth/Sex: 12-30-1930 (83 y.o.  Female) Treating RN: Afful, RN, BSN, [place] Primary Care Yeshaya Vath: [name] Other Clinician: Referring Adele Milson: [name] Treating Kamani Lewter/Extender: Frann Rider in Treatment: 16 Wound Status Wound Number: 1 Primary Etiology: Pressure Ulcer Wound Location: Right Calcaneus - Medial Wound Status: Open Wounding Event: Pressure Injury Comorbid History: Cataracts, Hypertension Date Acquired: 07/02/2016 Weeks Of Treatment: 16 Clustered Wound: No Photos Wound Measurements Length: (cm) 1 Width: (cm) 2 Depth: (cm) 0.2 Area: (cm) 1.571 Volume: (cm) 0.314 % Reduction in Area: 87.3% % Reduction in Volume: 74.6% Epithelialization: Small (1-33%) Tunneling: No Undermining: No Wound Description Classification:  Category/Stage III Wound Margin: Flat and Intact Exudate Amount: Large Exudate Type: Serosanguineous Exudate Color: red, brown [name], [name]. ([id number]) Foul Odor After Cleansing: Yes Due to Product Use: No Wound Bed Granulation Amount: Medium (34-66%) Exposed Structure Granulation Quality: Pink Fat Layer (Subcutaneous Tissue) Exposed: Yes Necrotic Amount: Medium (34-66%) Necrotic Quality: Adherent Slough Periwound Skin Texture Texture Color No Abnormalities Noted: No No Abnormalities Noted: No Callus: No Atrophie Blanche: No Crepitus: No Cyanosis: No Excoriation: Yes Ecchymosis: No Induration: No Erythema: No Rash: No Hemosiderin Staining: No Scarring: No Mottled: No Pallor: No Moisture Rubor: No No Abnormalities Noted: No Dry / Scaly: No Temperature / Pain Maceration: Yes Temperature: No Abnormality Tenderness on Palpation: Yes Wound Preparation Ulcer Cleansing: Rinsed/Irrigated with Saline Topical Anesthetic Applied: Other: lidocaine 4%, Treatment Notes Wound #1 (Right, Medial Calcaneus) 1. Cleansed with: Clean wound with Normal Saline 2. Anesthetic Topical Lidocaine 4% cream to wound bed prior to debridement 4.  Dressing Applied: Aquacel Ag Other dressing (specify in notes) 5. Secondary Dressing Applied Bordered Foam Dressing Dry Gauze Kerlix/Conform 7. Secured with Tape Notes endoform Electronic Signature(s) Signed: 12/31/2016 4:04:36 PM By: Regan Lemming BSN, RN Entered By: Regan Lemming on 12/31/2016 16:04:36 [name], [name] ([id number]) [name], [name] ([id number]) -------------------------------------------------------------------------------- Wound Assessment Details Patient Name: [name]. Date of Service: 12/31/2016 3:00 PM Medical Record Number: [id number] Patient Account Number: [id number] Date of Birth/Sex: 06-Feb-1931 (83 y.o. Female) Treating RN: Afful, RN, BSN, [place] Primary Care Pradeep Beaubrun: [name] Other Clinician: Referring Nazier Neyhart: [name] Treating Shenita Trego/Extender: Frann Rider in Treatment: 16 Wound Status Wound Number: 2 Primary Etiology: Pressure Ulcer Wound Location: Left Calcaneus - Medial Wound Status: Open Wounding Event: Pressure Injury Comorbid History: Cataracts, Hypertension Date Acquired: 07/02/2016 Weeks Of Treatment: 16 Clustered Wound: No Photos Photo Uploaded By: Regan Lemming on 12/31/2016 16:03:07 Wound Measurements Length: (cm) 2 Width: (cm) 4 Depth: (cm) 1 Area: (cm) 6.283 Volume: (cm) 6.283 % Reduction in Area: 68.3% % Reduction in Volume: -217.5% Epithelialization: None Tunneling: No Undermining: No Wound Description Classification: Category/Stage III Foul Odor Aft Wound Margin: Flat and Intact Due to Produc Exudate Amount: Large Slough/Fibrin Exudate Type: Serosanguineous Exudate Color: red, brown [name], [name] ([id number]) er Cleansing: Yes t Use: No o Yes Wound Bed Granulation Amount: Medium (34-66%) Exposed Structure Granulation Quality: Pink Fat Layer (Subcutaneous Tissue) Exposed: Yes Necrotic Amount: Medium (34-66%) Necrotic Quality: Adherent Slough Periwound Skin Texture Texture Color No  Abnormalities Noted: No No Abnormalities Noted: No Callus: No Atrophie Blanche: No Crepitus: No Cyanosis: No Excoriation: Yes Ecchymosis: No Induration: No Erythema: No Rash: No Hemosiderin Staining: No Scarring: No Mottled: No Pallor: No Moisture Rubor: No No Abnormalities Noted: No Dry / Scaly: No Temperature / Pain Maceration: Yes Temperature: No Abnormality Tenderness on Palpation: Yes Wound Preparation Ulcer Cleansing: Rinsed/Irrigated with Saline Topical Anesthetic Applied: Other: Lidocaine 4%, Treatment Notes Wound #2 (Left, Medial Calcaneus) 1. Cleansed with: Clean wound with Normal Saline 2. Anesthetic Topical Lidocaine 4% cream to wound bed prior to debridement 4. Dressing Applied: Aquacel Ag Other dressing (specify in notes) 5. Secondary Dressing Applied Bordered Foam Dressing Dry Gauze Kerlix/Conform 7. Secured with Tape Notes endoform Electronic Signature(s) Signed: 12/31/2016 4:40:53 PM By: Regan Lemming BSN, RN [name], [name] ([id number]) Entered By: Regan Lemming on 12/31/2016 15:22:01 [name] ([id number]) -------------------------------------------------------------------------------- Wound Assessment Details Patient Name: [name]. Date of Service: 12/31/2016 3:00 PM Medical Record Number: [id number] Patient Account Number: [id number] Date of Birth/Sex: 06/24/1931 (83 y.o. Female) Treating RN: Afful,  RN, BSN, Velva Harman Primary Care Olga Seyler: [name] Other Clinician: Referring Joscelin Fray: [name] Treating Graceanna Theissen/Extender: Frann Rider in Treatment: 16 Wound Status Wound Number: 7 Primary Etiology: Pressure Ulcer Wound Location: Left Gluteal fold Wound Status: Open Wounding Event: Pressure Injury Comorbid History: Cataracts, Hypertension Date Acquired: 12/24/2016 Weeks Of Treatment: 1 Clustered Wound: No Photos Photo Uploaded By: Regan Lemming on 12/31/2016 16:03:08 Wound Measurements Length: (cm) 1 %  Reduction in Width: (cm) 2 % Reduction in Depth: (cm) 0.1 Epithelializat Area: (cm) 1.571 Tunneling: Volume: (cm) 0.157 Undermining: Area: 46.7% Volume: 73.3% ion: None No No Wound Description Classification: Category/Stage II Foul Odor Aft Wound Margin: Flat and Intact Slough/Fibrin Exudate Amount: Large Exudate Type: Serous Exudate Color: amber er Cleansing: No o No Wound Bed Granulation Amount: Medium (34-66%) Exposed Structure Granulation Quality: Red Fascia Exposed: No Necrotic Amount: Medium (34-66%) Fat Layer (Subcutaneous Tissue) Exposed: Yes Necrotic Quality: Adherent Slough Tendon Exposed: No [name], Danielle S. ([id number]) Muscle Exposed: No Joint Exposed: No Bone Exposed: No Periwound Skin Texture Texture Color No Abnormalities Noted: No No Abnormalities Noted: No Callus: No Atrophie Blanche: No Crepitus: No Cyanosis: No Excoriation: No Ecchymosis: No Induration: No Erythema: No Rash: No Hemosiderin Staining: No Scarring: No Mottled: No Pallor: No Moisture Rubor: No No Abnormalities Noted: No Dry / Scaly: No Temperature / Pain Maceration: Yes Temperature: No Abnormality Tenderness on Palpation: Yes Wound Preparation Ulcer Cleansing: Rinsed/Irrigated with Saline Topical Anesthetic Applied: Other: lidocaine 4%, Treatment Notes Wound #7 (Left Gluteal fold) 1. Cleansed with: Clean wound with Normal Saline 2. Anesthetic Topical Lidocaine 4% cream to wound bed prior to debridement 4. Dressing Applied: Aquacel Ag Other dressing (specify in notes) 5. Secondary Dressing Applied Bordered Foam Dressing Dry Gauze Kerlix/Conform 7. Secured with Tape Notes endoform Electronic Signature(s) Signed: 12/31/2016 4:40:53 PM By: Regan Lemming BSN, RN Entered By: Regan Lemming on 12/31/2016 15:22:26 [name] ([id number]) -------------------------------------------------------------------------------- [place] Details Patient Name: [name] Date of Service: 12/31/2016 3:00 PM Medical Record Number: [id number] Patient Account Number: [id number] Date of Birth/Sex: 11-28-1930 (83 y.o. Female) Treating RN: Afful, RN, BSN, [place] Primary Care Kaeley Vinje: [name] Other Clinician: Referring Len Azeez: [name] Treating Betti Goodenow/Extender: Frann Rider in Treatment: 16 Vital Signs Time Taken: 15:00 Temperature (F): 98.1 Height (in): 63 Pulse (bpm): 78 Weight (lbs): 160 Respiratory Rate (breaths/min): 16 Body Mass Index (BMI): 28.3 Blood Pressure (mmHg): 122/67 Reference Range: 80 - 120 mg / dl Electronic Signature(s) Signed: 12/31/2016 4:40:53 PM By: Regan Lemming BSN, RN Previous Signature: 12/31/2016 2:56:14 PM Version By: Regan Lemming BSN, RN Previous Signature: 12/31/2016 2:56:01 PM Version By: Regan Lemming BSN, RN Entered By: Regan Lemming on 12/31/2016 15:06:45

## 2017-01-01 NOTE — Progress Notes (Addendum)
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 exhibited: Excoriation, Maceration. The periwound skin appearance did not exhibit: Callus, Crepitus,  Induration, Rash, Scarring, Dry/Scaly, Atrophie Blanche, Cyanosis, Ecchymosis, Hemosiderin Staining, Mottled, Pallor, Rubor, Erythema. Periwound temperature was noted as No Abnormality. The periwound has tenderness on palpation. Wound #7 status is Open. Original cause of wound was Pressure Injury. The wound is located on the Left Gluteal fold. The wound measures 1cm length x 2cm width x 0.1cm depth; 1.571cm^2 area and 0.157cm^3 volume. There is Fat Layer (Subcutaneous Tissue) Exposed exposed. There is no tunneling or undermining noted. There is a large amount of serous drainage noted. The wound margin is flat and intact. There is medium (34-66%) red granulation within the wound bed. There is a medium (34-66%) amount of necrotic tissue within the  wound bed including Adherent Slough. The periwound skin appearance exhibited: Maceration. The periwound skin appearance did not exhibit: Callus, Crepitus, Excoriation, Induration, Rash, Scarring, Dry/Scaly, Atrophie Blanche, Cyanosis, Ecchymosis, Hemosiderin Staining, Mottled, Pallor, Rubor, Erythema. Periwound temperature was noted as No Abnormality. The periwound has tenderness on palpation. 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 ([id number]) Procedures Wound #2 Wound #2 is a Pressure Ulcer located on the Left,Medial Calcaneus . There was a Skin/Subcutaneous Tissue Debridement ([id number]) debridement with total area of 8 sq cm performed by Christin Fudge, MD. with the following instrument(s): Curette to remove Non-Viable tissue/material including Fibrin/Slough and Subcutaneous after achieving pain control using Lidocaine 4% Topical Solution. A time out was conducted at 15:20, prior to the start of the procedure. A Minimum amount of bleeding was controlled with Pressure. The procedure was tolerated well with a pain level of 0 throughout and a pain level of 0 following the procedure. Post Debridement Measurements: 2cm length x 4cm width x 1cm depth; 6.283cm^3 volume. Post debridement Stage noted as Category/Stage III. Character of Wound/Ulcer Post Debridement requires further debridement. Severity of Tissue Post Debridement is: Fat layer exposed. Post procedure Diagnosis Wound #2: Same as Pre-Procedure Wound #7 Wound #7 is a Pressure Ulcer located on the Left Gluteal fold . There was a Skin/Subcutaneous Tissue Debridement ([id number]) debridement with total area of 2 sq cm performed by Christin Fudge, MD.  with the following instrument(s): Curette to remove Non-Viable tissue/material including Fibrin/Slough and Subcutaneous after achieving pain control using Lidocaine 4% Topical Solution. A time out was conducted at 15:20, prior to the start of the procedure. A Minimum amount of bleeding was controlled with Pressure. The procedure was tolerated well with a pain level of 0 throughout and a pain level of 0 following the procedure. Post Debridement Measurements: 1cm length x 2cm width x 0.1cm depth; 0.157cm^3 volume. Post debridement Stage noted as Category/Stage II. Character of Wound/Ulcer Post Debridement requires further debridement. Severity of Tissue Post Debridement is: Fat layer exposed. Post procedure Diagnosis Wound #7: Same as Pre-Procedure Plan Wound Cleansing: Wound #1 Right,Medial Calcaneus: Clean wound with Normal Saline. Wound #2 Left,Medial Calcaneus: Clean wound with Normal Saline. Wound #7 Left Gluteal fold: Clean wound with Normal Saline. Anesthetic: Wound #1 Right,Medial Calcaneus: Topical Lidocaine 4% cream applied to wound bed prior to debridement - in clinic only Wound #2 Left,Medial Calcaneus: JEWELDEAN, DROHAN. ([id number]) Topical Lidocaine 4% cream applied to wound bed prior to debridement - in clinic only Wound #7 Left Gluteal fold: Topical Lidocaine 4% cream applied to wound bed prior to  debridement - in clinic only Skin Barriers/Peri-Wound Care: Wound #7 Left Gluteal fold: Skin Prep Primary Wound Dressing: Wound #1 Right,Medial Calcaneus: Other: - endoform - HH to provide this for patient to use Wound #2 Left,Medial Calcaneus: Other: - endoform - HH to provide this for patient to use Wound #7 Left Gluteal fold: Aquacel Ag Secondary Dressing: Wound #1 Right,Medial Calcaneus: Dry Gauze - Foam on tops of feet due to reddened areas. Conform/Kerlix Other - heel cups or ABD pads to support offloading Wound #2 Left,Medial Calcaneus: Dry Gauze - Foam on tops of  feet due to reddened areas. Conform/Kerlix Other - heel cups/ABD pads for offloading support Wound #7 Left Gluteal fold: Boardered Foam Dressing Dressing Change Frequency: Wound #1 Right,Medial Calcaneus: Change Dressing Monday, Wednesday, Friday Three times weekly - [facility] Wound #2 Left,Medial Calcaneus: Change Dressing Monday, Wednesday, Friday Three times weekly - [facility] Wound #7 Left Gluteal fold: Change Dressing Monday, Wednesday, Friday Three times weekly - [facility] Follow-up Appointments: Wound #1 Right,Medial Calcaneus: Return Appointment in 1 week. Wound #2 Left,Medial Calcaneus: Return Appointment in 1 week. Wound #7 Left Gluteal fold: Return Appointment in 1 week. Off-Loading: Wound #1 Right,Medial Calcaneus: Other: - float heels when in bed; keep pressure off during the day. Sage Boots at night. Wound #2 Left,Medial Calcaneus: Other: - float heels when in bed; keep pressure off during the day. Sage Boots at night. Wound #7 Left Gluteal fold: Other: - float heels when in bed; keep pressure off during the day. Sage Boots at night. Home Health: Wound #1 Right,Medial Calcaneus: [facility] Visits - [address] [place], MCCAMY ([id number]) [place] Nurse may visit PRN to address patient s wound care needs. FACE TO FACE ENCOUNTER: MEDICARE and MEDICAID PATIENTS: I certify that this patient is under my care and that I had a face-to-face encounter that meets the physician face-to-face encounter requirements with this patient on this date. The encounter with the patient was in whole or in part for the following MEDICAL CONDITION: (primary reason for [place]) MEDICAL NECESSITY: I certify, that based on my findings, NURSING services are a medically necessary home health service. HOME BOUND STATUS: I certify that my clinical findings support that this patient is homebound (i.e., Due to illness or injury, pt requires aid of supportive devices such as crutches, cane,  wheelchairs, walkers, the use of special transportation or the assistance of another person to leave their place of residence. There is a normal inability to leave the home and doing so requires considerable and taxing effort. Other absences are for medical reasons / religious services and are infrequent or of short duration when for other reasons). If current dressing causes regression in wound condition, may D/C ordered dressing product/s and apply Normal Saline Moist Dressing daily until next [place] / Other MD appointment. [place] of regression in wound condition at [phone]. Please direct any NON-WOUND related issues/requests for orders to patient's Primary Care Physician Wound #2 Left,Medial Calcaneus: [place] Visits - [place] Nurse may visit PRN to address patient s wound care needs. FACE TO FACE ENCOUNTER: MEDICARE and MEDICAID PATIENTS: I certify that this patient is under my care and that I had a face-to-face encounter that meets the physician face-to-face encounter requirements with this patient on this date. The encounter with the patient was in whole or in part for the following MEDICAL CONDITION: (primary reason for [place]) MEDICAL NECESSITY: I certify, that based on my findings, NURSING services are  a medically necessary home health service. HOME BOUND STATUS: I certify that my clinical findings support that this patient is homebound (i.e., Due to illness or injury, pt requires aid of supportive devices such as crutches, cane, wheelchairs, walkers, the use of special transportation or the assistance of another person to leave their place of residence. There is a normal inability to leave the home and doing so requires considerable and taxing effort. Other absences are for medical reasons / religious services and are infrequent or of short duration when for other reasons). If current dressing causes regression  in wound condition, may D/C ordered dressing product/s and apply Normal Saline Moist Dressing daily until next [place] / Other MD appointment. [place] of regression in wound condition at [phone]. Please direct any NON-WOUND related issues/requests for orders to patient's Primary Care Physician Wound #7 Left Gluteal fold: [place] Visits - [place] Nurse may visit PRN to address patient s wound care needs. FACE TO FACE ENCOUNTER: MEDICARE and MEDICAID PATIENTS: I certify that this patient is under my care and that I had a face-to-face encounter that meets the physician face-to-face encounter requirements with this patient on this date. The encounter with the patient was in whole or in part for the following MEDICAL CONDITION: (primary reason for [name]) MEDICAL NECESSITY: I certify, that based on my findings, NURSING services are a medically necessary home health service. HOME BOUND STATUS: I certify that my clinical findings support that this patient is homebound (i.e., Due to illness or injury, pt requires aid of supportive devices such as crutches, cane, wheelchairs, walkers, the use of special transportation or the assistance of another person to leave their place of residence. There is a normal inability to leave the home and doing so requires considerable and taxing effort. Other absences are for medical reasons / religious services and are infrequent or of short duration when for other reasons). If current dressing causes regression in wound condition, may D/C ordered dressing product/s and apply Normal Saline Moist Dressing daily until next [place] / Other MD appointment. [place] of regression in wound condition at [phone]. Please direct any NON-WOUND related issues/requests for orders to patient's Primary Care Physician SHIAH, BERHOW ([id number]) She has reached a stage where we  will try and get her some skin substitute samples to hasten the process of healing. In the meanwhile I have recommended: 1. Endoform to both heels, heel cups and offloading with Sage boots. 2. Adequate proteins, vitamin A, vitamin C and zinc. 3. Silver alginate and a bordered foam to the left gluteal region. 4. Offloading has been stressed with her again in great detail especially about change in position while sitting and sleeping Electronic Signature(s) Signed: 12/31/2016 5:01:14 PM By: Gretta Cool RN, BSN, Kim RN, BSN Signed: 01/03/2017 4:53:16 PM By: Christin Fudge MD, FACS Previous Signature: 12/31/2016 3:55:50 PM Version By: Christin Fudge MD, FACS Previous Signature: 12/31/2016 3:28:36 PM Version By: Christin Fudge MD, FACS Entered By: Gretta Cool RN, BSN, Kim on 12/31/2016 17:00:37 [name] ([id number]) -------------------------------------------------------------------------------- SuperBill Details Patient Name: NAIAH, DONAHOE. Date of Service: 12/31/2016 Medical Record Number: [id number] Patient Account Number: [id number] Date of Birth/Sex: 10/18/1931 (83 y.o. 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, stage 2 Facility Procedures CPT4 Code Description: [id number] 11042 - DEB SUBQ TISSUE 20 SQ CM/< ICD-10 Description Diagnosis L89.620 Pressure ulcer of left heel, unstageable L89.610 Pressure ulcer of right heel, unstageable 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 L89.322 Pressure ulcer  of left buttock, stage 2 Modifier: ot with fat la Quantity: 1 yer exposed Physician Procedures CPT4 Code Description: [id number] 11042 - WC PHYS SUBQ TISS 20 SQ CM ICD-10 Description Diagnosis L89.620 Pressure ulcer of left heel, unstageable L89.610 Pressure ulcer of right heel, unstageable 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o L89.322 Pressure ulcer of left buttock, stage 2 Modifier: t with fat lay Quantity: 1 er exposed Electronic Signature(s) Signed: 12/31/2016 3:28:48 PM By: Christin Fudge MD, FACS Entered By: Christin Fudge on 12/31/2016 31:54:00

## 2017-01-06 ENCOUNTER — Telehealth: Payer: Self-pay | Admitting: Urology

## 2017-01-06 NOTE — Telephone Encounter (Signed)
Spoke with pt in reference to SPT not draining and urine going into depends. Offered pt a nurse visit today or tomorrow. Pt declined stating she has other appts. Pt then stated that she will call back when she is able to RTC.

## 2017-01-06 NOTE — Telephone Encounter (Signed)
Pt called office asking to speak with Dr. Erlene Quan.  Pt states instead of her urine going into her bag, it is going into her diaper. Would like for someone to call her back. Please advise. Thanks.

## 2017-01-07 ENCOUNTER — Encounter: Payer: Medicare HMO | Admitting: Surgery

## 2017-01-07 DIAGNOSIS — L89620 Pressure ulcer of left heel, unstageable: Secondary | ICD-10-CM | POA: Diagnosis not present

## 2017-01-12 NOTE — Progress Notes (Signed)
[name], [name] ([id number]) Visit Report for 01/07/2017 Arrival Information Details Patient Name: [name], [name]. Date of Service: 01/07/2017 3:15 PM Medical Record Number: [id number] Patient Account Number: [id number] Date of Birth/Sex: August 26, 1931 (83 y.o. Female) Treating RN: Afful, RN, BSN, Velva Harman Primary Care Idelia Caudell: [name] Other Clinician: Referring Davonta Stroot: [name] Treating Jelan Batterton/Extender: Frann Rider in Treatment: 2 Visit Information History Since Last Visit All ordered tests and consults were completed: No Patient Arrived: Wheel Chair Added or deleted any medications: No Arrival Time: 15:30 Any new allergies or adverse reactions: No Accompanied By: self Had a fall or experienced change in No activities of daily living that may affect Transfer Assistance: Harrel Lemon Lift risk of falls: Patient Identification Verified: Yes Signs or symptoms of abuse/neglect since last No Secondary Verification Process Yes visito Completed: Hospitalized since last visit: No Patient Requires Transmission-Based No Has Dressing in Place as Prescribed: Yes Precautions: Pain Present Now: Yes Patient Has Alerts: No Electronic Signature(s) Signed: 01/11/2017 4:40:29 PM By: Regan Lemming BSN, RN Entered By: Regan Lemming on 01/07/2017 15:33:50 [name] ([id number]) -------------------------------------------------------------------------------- Encounter Discharge Information Details Patient Name: [name]. Date of Service: 01/07/2017 3:15 PM Medical Record Number: [id number] Patient Account Number: [id number] Date of Birth/Sex: 03/20/1931 (83 y.o. Female) Treating RN: Afful, RN, BSN, Velva Harman Primary Care Inocente Krach: [name] Other Clinician: Referring Savier Trickett: [name] Treating Derak Schurman/Extender: Frann Rider in Treatment: 17 Encounter Discharge Information Items Discharge Pain Level: 0 Discharge Condition: Stable Ambulatory Status:  Wheelchair Discharge Destination: Home Transportation: Other Accompanied By: self Schedule Follow-up Appointment: No Medication Reconciliation completed and provided to Patient/Care No Daijha Leggio: Provided on Clinical Summary of Care: 01/07/2017 Form Type Recipient Paper Patient EB Electronic Signature(s) Signed: 01/07/2017 4:27:12 PM By: Regan Lemming BSN, RN Previous Signature: 01/07/2017 4:07:53 PM Version By: Ruthine Dose Entered By: Regan Lemming on 01/07/2017 16:27:12 [name] ([id number]) -------------------------------------------------------------------------------- Lower Extremity Assessment Details Patient Name: [name]. Date of Service: 01/07/2017 3:15 PM Medical Record Number: [id number] Patient Account Number: [id number] Date of Birth/Sex: 07-Dec-1930 (83 y.o. Female) Treating RN: Afful, RN, BSN, Velva Harman Primary Care Yeraldi Fidler: [name] Other Clinician: Referring Loralye Loberg: [name] Treating Nithila Sumners/Extender: Frann Rider in Treatment: 17 Edema Assessment Assessed: [Left: No] [Right: No] Edema: [Left: No] [Right: No] Vascular Assessment Pulses: Dorsalis Pedis Palpable: [Left:Yes] [Right:Yes] Posterior Tibial Extremity colors, hair growth, and conditions: Extremity Color: [Left:Dusky] [Right:Dusky] Hair Growth on Extremity: [Left:No] [Right:No] Temperature of Extremity: [Left:Warm] [Right:Warm] Capillary Refill: [Left:< 3 seconds] [Right:< 3 seconds] Toe Nail Assessment Left: Right: Thick: Yes Yes Discolored: Yes Yes Deformed: Yes Yes Improper Length and Hygiene: Yes Yes Electronic Signature(s) Signed: 01/11/2017 4:40:29 PM By: Regan Lemming BSN, RN Entered By: Regan Lemming on 01/07/2017 15:34:48 [name] ([id number]) -------------------------------------------------------------------------------- Multi Wound Chart Details Patient Name: [name]. Date of Service: 01/07/2017 3:15 PM Medical Record Number:  [id number] Patient Account Number: [id number] Date of Birth/Sex: 08/16/1931 (83 y.o. Female) Treating RN: Baruch Gouty, RN, BSN, Velva Harman Primary Care Panda Crossin: [name] Other Clinician: Referring Zamir Staples: [name] Treating Mirakle Tomlin/Extender: Frann Rider in Treatment: 17 Vital Signs Height(in): 63 Pulse(bpm): 64 Weight(lbs): 160 Blood Pressure 108/40 (mmHg): Body Mass Index(BMI): 28 Temperature(F): 98.2 Respiratory Rate 17 (breaths/min): Photos: [1:No Photos] [2:No Photos] [7:No Photos] Wound Location: [1:Right Calcaneus - Medial] [2:Left Calcaneus - Medial] [7:Left Gluteal fold] Wounding Event: [1:Pressure Injury] [2:Pressure Injury] [7:Pressure Injury] Primary Etiology: [1:Pressure Ulcer] [2:Pressure Ulcer] [7:Pressure Ulcer] Comorbid History: [1:Cataracts, Hypertension] [2:Cataracts, Hypertension] [7:Cataracts, Hypertension] Date Acquired: [1:07/02/2016] [2:07/02/2016] [7:12/24/2016] Weeks  of Treatment: [1:17] [2:17] [7:2] Wound Status: [1:Open] [2:Open] [7:Open] Measurements L x W x D 1.3x2x0.2 [2:2x4x1] [7:0.8x1.5x0.1] (cm) Area (cm) : [1:2.042] [2:6.283] [7:0.942] Volume (cm) : [1:0.408] [2:6.283] [7:0.094] % Reduction in Area: [1:83.50%] [2:68.30%] [7:68.00%] % Reduction in Volume: 67.00% [2:-217.50%] [7:84.00%] Classification: [1:Category/Stage III] [2:Category/Stage III] [7:Category/Stage II] Exudate Amount: [1:Large] [2:Large] [7:Large] Exudate Type: [1:Serosanguineous] [2:Serosanguineous] [7:Serous] Exudate Color: [1:red, brown] [2:red, brown] [7:amber] Foul Odor After [1:Yes] [2:Yes] [7:Yes] Cleansing: Odor Anticipated Due to No [2:No] [7:No] Product Use: Wound Margin: [1:Flat and Intact] [2:Flat and Intact] [7:Flat and Intact] Granulation Amount: [1:Medium (34-66%)] [2:Medium (34-66%)] [7:Small (1-33%)] Granulation Quality: [1:Pink] [2:Pink] [7:Pink] Necrotic Amount: [1:Medium (34-66%)] [2:Medium (34-66%)] [7:Medium (34-66%)] Exposed  Structures: [1:Fat Layer (Subcutaneous Tissue) Exposed: Yes] [2:Fat Layer (Subcutaneous Tissue) Exposed: Yes] [7:Fat Layer (Subcutaneous Tissue) Exposed: Yes Fascia: No Tendon: No] Muscle: No Joint: No Bone: No Epithelialization: Small (1-33%) None None Debridement: N/A N/A Debridement ([id number]) Pre-procedure N/A N/A 15:49 Verification/Time Out Taken: Pain Control: N/A N/A Lidocaine 4% Topical Solution Tissue Debrided: N/A N/A Fibrin/Slough, Subcutaneous Level: N/A N/A Skin/Subcutaneous Tissue Debridement Area (sq N/A N/A 1.2 cm): Instrument: N/A N/A Curette Bleeding: N/A N/A Minimum Hemostasis Achieved: N/A N/A Silver Nitrate Procedural Pain: N/A N/A 0 Post Procedural Pain: N/A N/A 0 Debridement Treatment N/A N/A Procedure was tolerated Response: well Post Debridement N/A N/A 0.8x1.5x0.1 Measurements L x W x D (cm) Post Debridement N/A N/A 0.094 Volume: (cm) Post Debridement N/A N/A Category/Stage III Stage: Periwound Skin Texture: Excoriation: Yes Excoriation: Yes Excoriation: No Callus: Yes Induration: No Induration: No Induration: No Callus: No Callus: No Crepitus: No Crepitus: No Crepitus: No Rash: No Rash: No Rash: No Scarring: No Scarring: No Scarring: No Periwound Skin Maceration: Yes Maceration: Yes Maceration: Yes Moisture: Dry/Scaly: No Dry/Scaly: No Dry/Scaly: No Periwound Skin Color: Atrophie Blanche: No Atrophie Blanche: No Atrophie Blanche: No Cyanosis: No Cyanosis: No Cyanosis: No Ecchymosis: No Ecchymosis: No Ecchymosis: No Erythema: No Erythema: No Erythema: No Hemosiderin Staining: No Hemosiderin Staining: No Hemosiderin Staining: No Mottled: No Mottled: No Mottled: No Pallor: No Pallor: No Pallor: No Rubor: No Rubor: No Rubor: No Temperature: No Abnormality No Abnormality No Abnormality Tenderness on Yes Yes Yes Palpation: [name], [name] ([id number]) Wound Preparation: Ulcer Cleansing: Ulcer Cleansing:  Ulcer Cleansing: Rinsed/Irrigated with Rinsed/Irrigated with Rinsed/Irrigated with Saline Saline Saline Topical Anesthetic Topical Anesthetic Topical Anesthetic Applied: Other: lidocaine Applied: Other: Lidocaine Applied: Other: lidocaine 4% 4% 4% Procedures Performed: N/A N/A Debridement Treatment Notes Electronic Signature(s) Signed: 01/07/2017 3:55:03 PM By: Christin Fudge MD, FACS Previous Signature: 01/07/2017 3:45:27 PM Version By: Regan Lemming BSN, RN Entered By: Christin Fudge on 01/07/2017 15:55:03 [name] ([id number]) -------------------------------------------------------------------------------- [place] Details Patient Name: [name]. Date of Service: 01/07/2017 3:15 PM Medical Record Number: [id number] Patient Account Number: [id number] Date of Birth/Sex: 1931/08/13 (83 y.o. Female) Treating RN: Afful, RN, BSN, Velva Harman Primary Care Spyros Winch: [name] Other Clinician: Referring Jamiria Langill: [name] Treating Faatima Tench/Extender: Frann Rider in Treatment: 17 Active Inactive ` Abuse / Safety / Falls / Self Care Management Nursing Diagnoses: Impaired physical mobility Potential for falls Goals: Patient will remain injury free Date Initiated: 09/06/2016 Target Resolution Date: 12/02/2016 Goal Status: Active Interventions: Assess fall risk on admission and as needed Assess self care needs on admission and as needed Notes: ` Necrotic Tissue Nursing Diagnoses: Impaired tissue integrity related to necrotic/devitalized tissue Goals: Necrotic/devitalized tissue will be minimized in the wound bed Date Initiated: 09/06/2016 Target Resolution Date: 12/02/2016 Goal Status: Active  Interventions: Assess patient pain level pre-, during and post procedure and prior to discharge Notes: ` Orientation to the Wound Care Program Nursing Diagnoses: [name], [name] ([id number]) Knowledge deficit related to the wound healing center  program Goals: Patient/caregiver will verbalize understanding of the [place] Date Initiated: 09/06/2016 Target Resolution Date: 12/02/2016 Goal Status: Active Interventions: Provide education on orientation to the wound center Notes: ` Pressure Nursing Diagnoses: Knowledge deficit related to management of pressures ulcers Potential for impaired tissue integrity related to pressure, friction, moisture, and shear Goals: Patient will remain free of pressure ulcers Date Initiated: 09/06/2016 Target Resolution Date: 12/02/2016 Goal Status: Active Interventions: Assess: immobility, friction, shearing, incontinence upon admission and as needed Notes: ` Soft Tissue Infection Nursing Diagnoses: Impaired tissue integrity Potential for infection: soft tissue Goals: Patient will remain free of wound infection Date Initiated: 09/06/2016 Target Resolution Date: 12/02/2016 Goal Status: Active Interventions: Assess signs and symptoms of infection every visit Notes: ` Wound/Skin Impairment [name], [name] ([id number]) Nursing Diagnoses: Impaired tissue integrity Goals: Ulcer/skin breakdown will heal within 14 weeks Date Initiated: 09/06/2016 Target Resolution Date: 12/16/2016 Goal Status: Active Interventions: Assess ulceration(s) every visit Notes: Electronic Signature(s) Signed: 01/07/2017 3:45:15 PM By: Regan Lemming BSN, RN Entered By: Regan Lemming on 01/07/2017 15:45:15 [name] ([id number]) -------------------------------------------------------------------------------- Pain Assessment Details Patient Name: [name]. Date of Service: 01/07/2017 3:15 PM Medical Record Number: [id number] Patient Account Number: [id number] Date of Birth/Sex: January 25, 1931 (83 y.o. Female) Treating RN: Afful, RN, BSN, Velva Harman Primary Care Saphyre Cillo: [name] Other Clinician: Referring Jacai Kipp: [name] Treating Malik Ruffino/Extender: Frann Rider in  Treatment: 17 Active Problems Location of Pain Severity and Description of Pain Patient Has Paino Yes Site Locations Pain Location: Pain in Ulcers Rate the pain. Current Pain Level: 3 Character of Pain Describe the Pain: Tender Pain Management and Medication Current Pain Management: Electronic Signature(s) Signed: 01/11/2017 4:40:29 PM By: Regan Lemming BSN, RN Entered By: Regan Lemming on 01/07/2017 15:34:01 [name] ([id number]) -------------------------------------------------------------------------------- Patient/Caregiver Education Details Patient Name: [name]. Date of Service: 01/07/2017 3:15 PM Medical Record Number: [id number] Patient Account Number: [id number] Date of Birth/Gender: 1931/09/03 (83 y.o. Female) Treating RN: Afful, RN, BSN, Velva Harman Primary Care Physician: [name] Other Clinician: Referring Physician: [name] Treating Physician/Extender: Frann Rider in Treatment: 17 Education Assessment Education Provided To: Patient Education Topics Provided Welcome To [facility]: Electronic Signature(s) Signed: 01/11/2017 4:40:29 PM By: Regan Lemming BSN, RN Entered By: Regan Lemming on 01/07/2017 16:27:27 [name] ([id number]) -------------------------------------------------------------------------------- Wound Assessment Details Patient Name: [name]. Date of Service: 01/07/2017 3:15 PM Medical Record Number: [id number] Patient Account Number: [id number] Date of Birth/Sex: 1931/09/09 (83 y.o. Female) Treating RN: Afful, RN, BSN, [place] Primary Care Cesily Cuoco: [name] Other Clinician: Referring Sharyon Peitz: [name] Treating Eraina Winnie/Extender: Frann Rider in Treatment: 17 Wound Status Wound Number: 1 Primary Etiology: Pressure Ulcer Wound Location: Right Calcaneus - Medial Wound Status: Open Wounding Event: Pressure Injury Comorbid History: Cataracts, Hypertension Date Acquired:  07/02/2016 Weeks Of Treatment: 17 Clustered Wound: No Photos Photo Uploaded By: Regan Lemming on 01/07/2017 16:22:23 Wound Measurements Length: (cm) 1.3 Width: (cm) 2 Depth: (cm) 0.2 Area: (cm) 2.042 Volume: (cm) 0.408 % Reduction in Area: 83.5% % Reduction in Volume: 67% Epithelialization: Small (1-33%) Tunneling: No Undermining: No Wound Description Classification: Category/Stage III Foul Odor Aft Wound Margin: Flat and Intact Due to Produc Exudate Amount: Large Slough/Fibrin Exudate Type: Serosanguineous Exudate Color: red, brown er Cleansing: Yes  t Use: No o Yes Wound Bed Granulation Amount: Medium (34-66%) Exposed Structure Granulation Quality: Pink Fat Layer (Subcutaneous Tissue) Exposed: Yes Necrotic Amount: Medium (34-66%) Necrotic Quality: [address] [place] ([id number]) Periwound Skin Texture Texture Color No Abnormalities Noted: No No Abnormalities Noted: No Callus: Yes Atrophie Blanche: No Crepitus: No Cyanosis: No Excoriation: Yes Ecchymosis: No Induration: No Erythema: No Rash: No Hemosiderin Staining: No Scarring: No Mottled: No Pallor: No Moisture Rubor: No No Abnormalities Noted: No Dry / Scaly: No Temperature / Pain Maceration: Yes Temperature: No Abnormality Tenderness on Palpation: Yes Wound Preparation Ulcer Cleansing: Rinsed/Irrigated with Saline Topical Anesthetic Applied: Other: lidocaine 4%, Treatment Notes Wound #1 (Right, Medial Calcaneus) 1. Cleansed with: Clean wound with Normal Saline 4. Dressing Applied: Other dressing (specify in notes) 5. Secondary Dressing Applied Bordered Foam Dressing Dry Gauze Kerlix/Conform 7. Secured with Tape Notes endoform Electronic Signature(s) Signed: 01/07/2017 3:45:05 PM By: Regan Lemming BSN, RN Entered By: Regan Lemming on 01/07/2017 15:45:05 [name] ([id number]) -------------------------------------------------------------------------------- Wound Assessment  Details Patient Name: [name]. Date of Service: 01/07/2017 3:15 PM Medical Record Number: [id number] Patient Account Number: [id number] Date of Birth/Sex: [birth year] (83 y.o. Female) Treating RN: Afful, RN, BSN, [place] Primary Care Fitzgerald Dunne: [name] Other Clinician: Referring Tod Abrahamsen: [name] Treating Azayla Polo/Extender: Frann Rider in Treatment: 17 Wound Status Wound Number: 2 Primary Etiology: Pressure Ulcer Wound Location: Left Calcaneus - Medial Wound Status: Open Wounding Event: Pressure Injury Comorbid History: Cataracts, Hypertension Date Acquired: 07/02/2016 Weeks Of Treatment: 17 Clustered Wound: No Photos Photo Uploaded By: Regan Lemming on 01/07/2017 16:22:24 Wound Measurements Length: (cm) 2 Width: (cm) 4 Depth: (cm) 1 Area: (cm) 6.283 Volume: (cm) 6.283 % Reduction in Area: 68.3% % Reduction in Volume: -217.5% Epithelialization: None Tunneling: No Undermining: No Wound Description Classification: Category/Stage III Foul Odor Aft Wound Margin: Flat and Intact Due to Produc Exudate Amount: Large Slough/Fibrin Exudate Type: Serosanguineous Exudate Color: red, brown er Cleansing: Yes t Use: No o Yes Wound Bed Granulation Amount: Medium (34-66%) Exposed Structure Granulation Quality: Pink Fat Layer (Subcutaneous Tissue) Exposed: Yes Necrotic Amount: Medium (34-66%) Necrotic Quality: [address], Danielle S. ([id number]) Periwound Skin Texture Texture Color No Abnormalities Noted: No No Abnormalities Noted: No Callus: No Atrophie Blanche: No Crepitus: No Cyanosis: No Excoriation: Yes Ecchymosis: No Induration: No Erythema: No Rash: No Hemosiderin Staining: No Scarring: No Mottled: No Pallor: No Moisture Rubor: No No Abnormalities Noted: No Dry / Scaly: No Temperature / Pain Maceration: Yes Temperature: No Abnormality Tenderness on Palpation: Yes Wound Preparation Ulcer Cleansing: Rinsed/Irrigated  with Saline Topical Anesthetic Applied: Other: Lidocaine 4%, Treatment Notes Wound #2 (Left, Medial Calcaneus) 1. Cleansed with: Clean wound with Normal Saline 4. Dressing Applied: Other dressing (specify in notes) 5. Secondary Dressing Applied Bordered Foam Dressing Dry Gauze Kerlix/Conform 7. Secured with Tape Notes endoform Electronic Signature(s) Signed: 01/11/2017 4:40:29 PM By: Regan Lemming BSN, RN Entered By: Regan Lemming on 01/07/2017 15:43:09 [name] ([id number]) -------------------------------------------------------------------------------- Wound Assessment Details Patient Name: [name]. Date of Service: 01/07/2017 3:15 PM Medical Record Number: [id number] Patient Account Number: [id number] Date of Birth/Sex: 03-Oct-1931 (83 y.o. Female) Treating RN: Afful, RN, BSN, Velva Harman Primary Care Sohan Potvin: [name] Other Clinician: Referring Lucila Klecka: [name] Treating Kerra Guilfoil/Extender: Frann Rider in Treatment: 17 Wound Status Wound Number: 7 Primary Etiology: Pressure Ulcer Wound Location: Left Gluteal fold Wound Status: Open Wounding Event: Pressure Injury Comorbid History: Cataracts, Hypertension Date Acquired: 12/24/2016 Weeks Of Treatment: 2 Clustered Wound: No Photos  Photo Uploaded By: Regan Lemming on 01/07/2017 16:22:45 Wound Measurements Length: (cm) 0.8 % Reduction in Width: (cm) 1.5 % Reduction in Depth: (cm) 0.1 Epithelializat Area: (cm) 0.942 Tunneling: Volume: (cm) 0.094 Undermining: Area: 68% Volume: 84% ion: None No No Wound Description Classification: Category/Stage II Foul Odor Aft Wound Margin: Flat and Intact Due to Produc Exudate Amount: Large Slough/Fibrin Exudate Type: Serous Exudate Color: amber er Cleansing: Yes t Use: No o Yes Wound Bed Granulation Amount: Small (1-33%) Exposed Structure Granulation Quality: Pink Fascia Exposed: No Necrotic Amount: Medium (34-66%) Fat Layer (Subcutaneous  Tissue) Exposed: Yes Necrotic Quality: Adherent Slough Tendon Exposed: No Zuniga, Danielle S. ([id number]) Muscle Exposed: No Joint Exposed: No Bone Exposed: No Periwound Skin Texture Texture Color No Abnormalities Noted: No No Abnormalities Noted: No Callus: No Atrophie Blanche: No Crepitus: No Cyanosis: No Excoriation: No Ecchymosis: No Induration: No Erythema: No Rash: No Hemosiderin Staining: No Scarring: No Mottled: No Pallor: No Moisture Rubor: No No Abnormalities Noted: No Dry / Scaly: No Temperature / Pain Maceration: Yes Temperature: No Abnormality Tenderness on Palpation: Yes Wound Preparation Ulcer Cleansing: Rinsed/Irrigated with Saline Topical Anesthetic Applied: Other: lidocaine 4%, Treatment Notes Wound #7 (Left Gluteal fold) 1. Cleansed with: Clean wound with Normal Saline 4. Dressing Applied: Aquacel Ag 5. Secondary Dressing Applied Bordered Foam Dressing Electronic Signature(s) Signed: 01/07/2017 3:44:40 PM By: Regan Lemming BSN, RN Entered By: Regan Lemming on 01/07/2017 15:44:40 [name] ([id number]) -------------------------------------------------------------------------------- [place] Details Patient Name: [name] Date of Service: 01/07/2017 3:15 PM Medical Record Number: [id number] Patient Account Number: [id number] Date of Birth/Sex: [birth year] (83 y.o. Female) Treating RN: Afful, RN, BSN, [place] Primary Care Iraida Cragin: [name] Other Clinician: Referring Lukasz Rogus: [name] Treating Lisha Vitale/Extender: Frann Rider in Treatment: 17 Vital Signs Time Taken: 15:34 Temperature (F): 98.2 Height (in): 63 Pulse (bpm): 64 Weight (lbs): 160 Respiratory Rate (breaths/min): 17 Body Mass Index (BMI): 28.3 Blood Pressure (mmHg): 108/40 Reference Range: 80 - 120 mg / dl Electronic Signature(s) Signed: 01/11/2017 4:40:29 PM By: Regan Lemming BSN, RN Entered By: Regan Lemming on 01/07/2017 15:34:21

## 2017-01-12 NOTE — Progress Notes (Signed)
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 controlled with Silver Nitrate. The procedure was tolerated well with a pain level of 0 throughout and a pain level of 0 following the procedure. Post Debridement Measurements: 0.8cm length x 1.5cm width x 0.1cm depth; 0.094cm^3 volume. Post debridement Stage noted as Category/Stage III. Character of Wound/Ulcer Post Debridement is stable. Severity of Tissue Post Debridement is: Fat layer exposed. Post procedure Diagnosis Wound #7: Same as Pre-Procedure Plan Wound Cleansing: Wound #1 Right,Medial Calcaneus: Clean wound with Normal Saline. Wound #2 Left,Medial Calcaneus: Clean wound with Normal Saline. Wound #7 Left Gluteal fold: Clean wound with Normal Saline. Anesthetic: Wound #1 Right,Medial Calcaneus: Topical Lidocaine 4% cream applied to wound bed prior to debridement - in clinic only Wound #2 Left,Medial Calcaneus: Topical Lidocaine 4% cream  applied to wound bed prior to debridement - in clinic only Wound #7 Left Gluteal fold: Topical Lidocaine 4% cream applied to wound bed prior to debridement - in clinic only Skin Barriers/Peri-Wound Care: Wound #7 Left Gluteal fold: Skin Prep Primary Wound Dressing: Wound #1 Right,Medial Calcaneus: Other: - endoform - HH to provide this for patient to use Wound #2 Left,Medial Calcaneus: Other: - endoform - HH to provide this for patient to use Wound #7 Left Gluteal fold: Aquacel Ag Secondary Dressing: [name], [name] ([id number]) Wound #1 Right,Medial Calcaneus: Dry Gauze - Foam on tops of feet due to reddened areas. Conform/Kerlix Other - heel cups or ABD pads to support offloading Wound #2 Left,Medial Calcaneus: Dry Gauze - Foam on tops of feet due to reddened areas. Conform/Kerlix Other - heel cups/ABD pads for offloading support Wound #7 Left Gluteal fold: Boardered Foam Dressing Dressing Change Frequency: Wound #1 Right,Medial Calcaneus: Change Dressing Monday, Wednesday, Friday Three times weekly - [facility] Wound #2 Left,Medial Calcaneus: Change Dressing Monday, Wednesday, Friday Three times weekly - [facility] Wound #7 Left Gluteal fold: Change Dressing Monday, Wednesday, Friday Three times weekly - [facility] Follow-up Appointments: Wound #1 Right,Medial Calcaneus: Return Appointment in 1 week. Wound #2 Left,Medial Calcaneus: Return Appointment in 1 week. Wound #7 Left Gluteal fold: Return Appointment in 1 week. Off-Loading: Wound #1 Right,Medial Calcaneus: Other: - float heels when in bed; keep pressure off during the day. Sage Boots at night. Wound #2 Left,Medial Calcaneus: Other: - float heels when in bed; keep pressure off during the day. Sage Boots at night. Wound #7 Left Gluteal fold: Other: - float heels when in bed; keep pressure off during the day. Sage Boots at night. Home Health: Wound #1 Right,Medial Calcaneus: Continue Home Health Visits - [place]  Nurse may visit PRN to address patient s wound care needs. FACE TO FACE ENCOUNTER: MEDICARE and MEDICAID PATIENTS: I certify that this patient is under my care and that I had a face-to-face encounter that meets the physician face-to-face encounter requirements with this patient on this date. The encounter with the patient was in whole or in part for the following MEDICAL CONDITION: (primary reason for [place]) MEDICAL NECESSITY: I certify, that based on my findings, NURSING services are a medically necessary home health service. HOME BOUND STATUS: I certify that my clinical findings support that this patient is homebound (i.e., Due to illness or injury, pt requires aid of supportive devices such as crutches, cane, wheelchairs, walkers, the use of special transportation or the assistance of another person to leave their place of residence. There is a normal inability to leave the home and doing so requires considerable and taxing effort. Other absences  are for medical reasons / religious services and are infrequent or of short duration when for other reasons). If current dressing causes regression in wound condition, may D/C ordered dressing product/s and apply Normal Saline Moist Dressing daily until next [place] / Other MD appointment. [place] of regression in wound condition at [phone]. [name], [name] ([id number]) Please direct any NON-WOUND related issues/requests for orders to patient's Primary Care Physician Wound #2 Left,Medial Calcaneus: [place] Visits - [place] Nurse may visit PRN to address patient s wound care needs. FACE TO FACE ENCOUNTER: MEDICARE and MEDICAID PATIENTS: I certify that this patient is under my care and that I had a face-to-face encounter that meets the physician face-to-face encounter requirements with this patient on this date. The encounter with the patient was in whole or in part for  the following MEDICAL CONDITION: (primary reason for [place]) MEDICAL NECESSITY: I certify, that based on my findings, NURSING services are a medically necessary home health service. HOME BOUND STATUS: I certify that my clinical findings support that this patient is homebound (i.e., Due to illness or injury, pt requires aid of supportive devices such as crutches, cane, wheelchairs, walkers, the use of special transportation or the assistance of another person to leave their place of residence. There is a normal inability to leave the home and doing so requires considerable and taxing effort. Other absences are for medical reasons / religious services and are infrequent or of short duration when for other reasons). If current dressing causes regression in wound condition, may D/C ordered dressing product/s and apply Normal Saline Moist Dressing daily until next [place] / Other MD appointment. [place] of regression in wound condition at [phone]. Please direct any NON-WOUND related issues/requests for orders to patient's Primary Care Physician Wound #7 Left Gluteal fold: [place] Visits - [place] Nurse may visit PRN to address patient s wound care needs. FACE TO FACE ENCOUNTER: MEDICARE and MEDICAID PATIENTS: I certify that this patient is under my care and that I had a face-to-face encounter that meets the physician face-to-face encounter requirements with this patient on this date. The encounter with the patient was in whole or in part for the following MEDICAL CONDITION: (primary reason for [place]) MEDICAL NECESSITY: I certify, that based on my findings, NURSING services are a medically necessary home health service. HOME BOUND STATUS: I certify that my clinical findings support that this patient is homebound (i.e., Due to illness or injury, pt requires aid of supportive devices such as crutches, cane, wheelchairs,  walkers, the use of special transportation or the assistance of another person to leave their place of residence. There is a normal inability to leave the home and doing so requires considerable and taxing effort. Other absences are for medical reasons / religious services and are infrequent or of short duration when for other reasons). If current dressing causes regression in wound condition, may D/C ordered dressing product/s and apply Normal Saline Moist Dressing daily until next [place] / Other MD appointment. [place] of regression in wound condition at [phone]. Please direct any NON-WOUND related issues/requests for orders to patient's Primary Care Physician She has reached a stage where we will try and get her some skin substitute samples to hasten the process of healing. her insurance will not cover this so we are trying to get some samples from the vendors In the meanwhile I have  recommended: 1. Endoform to both heels, heel cups and offloading with Sage boots. 2. Adequate proteins, vitamin A, vitamin C and zinc. 3. Silver alginate and a bordered foam to the left gluteal region. [name], [name] ([id number]) 4. Offloading has been stressed with her again in great detail especially about change in position while sitting and sleeping Electronic Signature(s) Signed: 01/07/2017 3:57:13 PM By: Christin Fudge MD, FACS Entered By: Christin Fudge on 01/07/2017 15:57:13 [name] ([id number]) -------------------------------------------------------------------------------- SuperBill Details Patient Name: [name]. Date of Service: 01/07/2017 Medical Record Number: [id number] Patient Account Number: [id number] Date of Birth/Sex: April 27, 1931 (83 y.o. 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 L89.322 Pressure ulcer of left buttock, stage 2 Facility Procedures CPT4 Code: [id number] Description: 16553 - DEB SUBQ TISSUE 20 SQ CM/< ICD-10 Description Diagnosis L89.322 Pressure ulcer of left buttock, stage 2 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

## 2017-01-13 ENCOUNTER — Encounter: Payer: Medicare HMO | Admitting: Surgery

## 2017-01-13 DIAGNOSIS — L89620 Pressure ulcer of left heel, unstageable: Secondary | ICD-10-CM | POA: Diagnosis not present

## 2017-01-14 NOTE — Progress Notes (Signed)
AISHI, COURTS ([id number]) Visit Report for 01/13/2017 Arrival Information Details Patient Name: [name], [name]. Date of Service: 01/13/2017 3:30 PM Medical Record Number: [id number] Patient Account Number: [id number] Date of Birth/Sex: 1930-12-16 (83 y.o. Female) Treating RN: Carolyne Fiscal, Debi Primary Care Jadence Kinlaw: [name] Other Clinician: Referring Ishika Chesterfield: [name] Treating Elsie Baynes/Extender: Frann Rider in Treatment: 18 Visit Information History Since Last Visit All ordered tests and consults were completed: No Patient Arrived: Wheel Chair Added or deleted any medications: No Arrival Time: 15:48 Any new allergies or adverse reactions: No Accompanied By: self Had a fall or experienced change in No activities of daily living that may affect Transfer Assistance: Harrel Lemon Lift risk of falls: Patient Identification Verified: Yes Signs or symptoms of abuse/neglect since last No Secondary Verification Process Yes visito Completed: Hospitalized since last visit: No Patient Requires Transmission-Based No Has Dressing in Place as Prescribed: Yes Precautions: Pain Present Now: No Patient Has Alerts: No Electronic Signature(s) Signed: 01/13/2017 5:02:31 PM By: Alric Quan Entered By: Alric Quan on 01/13/2017 15:52:41 [name] ([id number]) -------------------------------------------------------------------------------- Encounter Discharge Information Details Patient Name: [name]. Date of Service: 01/13/2017 3:30 PM Medical Record Number: [id number] Patient Account Number: [id number] Date of Birth/Sex: 01-29-1931 (83 y.o. Female) Treating RN: Carolyne Fiscal, Debi Primary Care Sundance Moise: [name] Other Clinician: Referring Brelyn Woehl: [name] Treating Jonai Weyland/Extender: Frann Rider in Treatment: 31 Encounter Discharge Information Items Discharge Pain Level: 2 Discharge Condition: Stable Ambulatory Status:  Wheelchair Discharge Destination: Home Transportation: Other Accompanied By: self Schedule Follow-up Appointment: Yes Medication Reconciliation completed and provided to Patient/Care Yes Destiny Hagin: Provided on Clinical Summary of Care: 01/13/2017 Form Type Recipient Paper Patient EB Electronic Signature(s) Signed: 01/13/2017 4:34:27 PM By: Ruthine Dose Entered By: Ruthine Dose on 01/13/2017 16:34:26 [name] ([id number]) -------------------------------------------------------------------------------- General Visit Notes Details Patient Name: [name]. Date of Service: 01/13/2017 3:30 PM Medical Record Number: [id number] Patient Account Number: [id number] Date of Birth/Sex: [birth year] (83 y.o. Female) Treating RN: Montey Hora Primary Care Nikol Lemar: [name] Other Clinician: Referring Jorian [place]hoite: [name] Treating Kree Rafter/Extender: Frann Rider in Treatment: 18 Notes Patient arrived via transport. Upon arrival to patient room, she was found to be sitting in her wheelchair at the bottom of her chair on the pillow where she keeps her feet propped up. Her feet were on the floor and her head was resting on the back of the seat slightly below where her shoulders should have been. Debi, Sallie and I pulled patient up to the proper position in her chair prior to moving her from chair to bed via hoyer lift. Patient states that she was positioned like this at home and was transported this way to [facility]. Electronic Signature(s) Signed: 01/13/2017 4:14:36 PM By: Montey Hora Entered By: Montey Hora on 01/13/2017 16:14:36 Holley, [name] ([id number]) -------------------------------------------------------------------------------- Lower Extremity Assessment Details Patient Name: [name]. Date of Service: 01/13/2017 3:30 PM Medical Record Number: [id number] Patient Account Number: [id number] Date of Birth/Sex: 11-Oct-1931 (83 y.o. Female) Treating  RN: Carolyne Fiscal, Debi Primary Care Margot Oriordan: [name] Other Clinician: Referring Ellyana Crigler: [name] Treating Zebastian Carico/Extender: Frann Rider in Treatment: 18 Vascular Assessment Pulses: Dorsalis Pedis Palpable: [Left:Yes] [Right:Yes] Posterior Tibial Extremity colors, hair growth, and conditions: Extremity Color: [Left:Dusky] [Right:Dusky] Hair Growth on Extremity: [Left:No] [Right:No] Temperature of Extremity: [Left:Warm] [Right:Warm] Capillary Refill: [Left:< 3 seconds] [Right:< 3 seconds] Dependent Rubor: [Left:No] [Right:No] Blanched when Elevated: [Left:No] [Right:No] Lipodermatosclerosis: [Right:No] Toe Nail Assessment Left: Right: Thick: Yes Yes Discolored: Yes Yes Deformed: Yes  Yes Improper Length and Hygiene: Yes Yes Electronic Signature(s) Signed: 01/13/2017 5:02:31 PM By: Alric Quan Entered By: Alric Quan on 01/13/2017 16:08:08 [name] ([id number]) -------------------------------------------------------------------------------- Multi Wound Chart Details Patient Name: [name]. Date of Service: 01/13/2017 3:30 PM Medical Record Number: [id number] Patient Account Number: [id number] Date of Birth/Sex: 02-26-1931 (83 y.o. Female) Treating RN: Carolyne Fiscal, Debi Primary Care Lucyann Romano: [name] Other Clinician: Referring Verneice Caspers: [name] Treating Abir Craine/Extender: Frann Rider in Treatment: 18 Vital Signs Height(in): 63 Pulse(bpm): 62 Weight(lbs): 160 Blood Pressure 105/48 (mmHg): Body Mass Index(BMI): 28 Temperature(F): 98.7 Respiratory Rate 18 (breaths/min): Photos: [1:No Photos] [2:No Photos] [7:No Photos] Wound Location: [1:Right Calcaneus - Medial] [2:Left Calcaneus - Medial] [7:Left Gluteal fold] Wounding Event: [1:Pressure Injury] [2:Pressure Injury] [7:Pressure Injury] Primary Etiology: [1:Pressure Ulcer] [2:Pressure Ulcer] [7:Pressure Ulcer] Comorbid History: [1:Cataracts,  Hypertension] [2:Cataracts, Hypertension] [7:Cataracts, Hypertension] Date Acquired: [1:07/02/2016] [2:07/02/2016] [7:12/24/2016] Weeks of Treatment: [1:18] [2:18] [7:2] Wound Status: [1:Open] [2:Open] [7:Open] Measurements L x W x D 1.4x2x0.2 [2:2.3x3.2x0.8] [7:0.5x1x0.1] (cm) Area (cm) : [1:2.199] [2:5.781] [7:0.393] Volume (cm) : [1:0.44] [2:4.624] [7:0.039] % Reduction in Area: [1:82.20%] [2:70.80%] [7:86.70%] % Reduction in Volume: 64.40% [2:-133.70%] [7:93.40%] Classification: [1:Category/Stage III] [2:Category/Stage III] [7:Category/Stage II] Exudate Amount: [1:Large] [2:Large] [7:Medium] Exudate Type: [1:Serosanguineous] [2:Serosanguineous] [7:Serous] Exudate Color: [1:red, brown] [2:red, brown] [7:amber] Foul Odor After [1:Yes] [2:Yes] [7:Yes] Cleansing: Odor Anticipated Due to No [2:No] [7:No] Product Use: Wound Margin: [1:Flat and Intact] [2:Flat and Intact] [7:Flat and Intact] Granulation Amount: [1:Medium (34-66%)] [2:Medium (34-66%)] [7:None Present (0%)] Granulation Quality: [1:Pink] [2:Pink] [7:N/A] Necrotic Amount: [1:Medium (34-66%)] [2:Medium (34-66%)] [7:Large (67-100%)] Exposed Structures: [1:Fat Layer (Subcutaneous Tissue) Exposed: Yes] [2:Fat Layer (Subcutaneous Tissue) Exposed: Yes] [7:Fat Layer (Subcutaneous Tissue) Exposed: Yes Fascia: No Tendon: No] Muscle: No Joint: No Bone: No Epithelialization: Small (1-33%) None None Debridement: Debridement (95093- Debridement (26712- Debridement (11042- 11047) 11047) 11047) Pre-procedure 16:00 16:00 16:00 Verification/Time Out Taken: Pain Control: Other Other Other Tissue Debrided: Fibrin/Slough, Exudates, Fibrin/Slough, Exudates, Fibrin/Slough, Exudates, Subcutaneous Subcutaneous Subcutaneous Level: Skin/Subcutaneous Skin/Subcutaneous Skin/Subcutaneous Tissue Tissue Tissue Debridement Area (sq 2.8 7.36 0.5 cm): Instrument: Curette Curette Curette Bleeding: Minimum Minimum Minimum Hemostasis Achieved:  Pressure Pressure Pressure Procedural Pain: 3 3 3  Post Procedural Pain: 3 3 3  Debridement Treatment Procedure was tolerated Procedure was tolerated Procedure was tolerated Response: well well well Post Debridement 1.4x2x0.3 2.3x3.2x0.8 0.5x1x0.2 Measurements L x W x D (cm) Post Debridement 0.66 4.624 0.079 Volume: (cm) Post Debridement Category/Stage III Category/Stage III Category/Stage II Stage: Periwound Skin Texture: Callus: Yes Excoriation: No Excoriation: No Excoriation: No Induration: No Induration: No Induration: No Callus: No Callus: No Crepitus: No Crepitus: No Crepitus: No Rash: No Rash: No Rash: No Scarring: No Scarring: No Scarring: No Periwound Skin Maceration: No Maceration: Yes Maceration: No Moisture: Dry/Scaly: No Dry/Scaly: No Dry/Scaly: No Periwound Skin Color: Atrophie Blanche: No Atrophie Blanche: No Atrophie Blanche: No Cyanosis: No Cyanosis: No Cyanosis: No Ecchymosis: No Ecchymosis: No Ecchymosis: No Erythema: No Erythema: No Erythema: No Hemosiderin Staining: No Hemosiderin Staining: No Hemosiderin Staining: No Mottled: No Mottled: No Mottled: No Pallor: No Pallor: No Pallor: No Rubor: No Rubor: No Rubor: No Temperature: No Abnormality No Abnormality No Abnormality Tenderness on Yes Yes Yes Palpation: Wound Preparation: [name], [name] ([id number]) Ulcer Cleansing: Ulcer Cleansing: Ulcer Cleansing: Rinsed/Irrigated with Rinsed/Irrigated with Rinsed/Irrigated with Saline Saline Saline Topical Anesthetic Topical Anesthetic Topical Anesthetic Applied: Other: lidocaine Applied: Other: Lidocaine Applied: Other: lidocaine 4% 4% 4% Procedures Performed: Debridement Debridement Debridement Treatment Notes Electronic Signature(s) Signed: 01/13/2017 4:18:34 PM By:  Christin Fudge MD, FACS Entered By: Christin Fudge on 01/13/2017 16:18:34 [name]  ([id number]) -------------------------------------------------------------------------------- [place] Details Patient Name: [name], [name]. Date of Service: 01/13/2017 3:30 PM Medical Record Number: [id number] Patient Account Number: [id number] Date of Birth/Sex: 09/23/1931 (83 y.o. Female) Treating RN: Carolyne Fiscal, Debi Primary Care Raylynn Hersh: [name] Other Clinician: Referring Virl Coble: [name] Treating Maija Biggers/Extender: Frann Rider in Treatment: 34 Active Inactive ` Abuse / Safety / Falls / Self Care Management Nursing Diagnoses: Impaired physical mobility Potential for falls Goals: Patient will remain injury free Date Initiated: 09/06/2016 Target Resolution Date: 12/02/2016 Goal Status: Active Interventions: Assess fall risk on admission and as needed Assess self care needs on admission and as needed Notes: ` Necrotic Tissue Nursing Diagnoses: Impaired tissue integrity related to necrotic/devitalized tissue Goals: Necrotic/devitalized tissue will be minimized in the wound bed Date Initiated: 09/06/2016 Target Resolution Date: 12/02/2016 Goal Status: Active Interventions: Assess patient pain level pre-, during and post procedure and prior to discharge Notes: ` Orientation to the Wound Care Program Nursing Diagnoses: [name], [name] ([id number]) Knowledge deficit related to the wound healing center program Goals: Patient/caregiver will verbalize understanding of the [facility] Program Date Initiated: 09/06/2016 Target Resolution Date: 12/02/2016 Goal Status: Active Interventions: Provide education on orientation to the wound center Notes: ` Pressure Nursing Diagnoses: Knowledge deficit related to management of pressures ulcers Potential for impaired tissue integrity related to pressure, friction, moisture, and shear Goals: Patient will remain free of pressure ulcers Date Initiated: 09/06/2016 Target  Resolution Date: 12/02/2016 Goal Status: Active Interventions: Assess: immobility, friction, shearing, incontinence upon admission and as needed Notes: ` Soft Tissue Infection Nursing Diagnoses: Impaired tissue integrity Potential for infection: soft tissue Goals: Patient will remain free of wound infection Date Initiated: 09/06/2016 Target Resolution Date: 12/02/2016 Goal Status: Active Interventions: Assess signs and symptoms of infection every visit Notes: ` Wound/Skin Impairment [name], [name] ([id number]) Nursing Diagnoses: Impaired tissue integrity Goals: Ulcer/skin breakdown will heal within 14 weeks Date Initiated: 09/06/2016 Target Resolution Date: 12/16/2016 Goal Status: Active Interventions: Assess ulceration(s) every visit Notes: Electronic Signature(s) Signed: 01/13/2017 5:02:31 PM By: Alric Quan Entered By: Alric Quan on 01/13/2017 16:08:27 [name] ([id number]) -------------------------------------------------------------------------------- Pain Assessment Details Patient Name: [name]. Date of Service: 01/13/2017 3:30 PM Medical Record Number: [id number] Patient Account Number: [id number] Date of Birth/Sex: [birth year] (83 y.o. Female) Treating RN: Carolyne Fiscal, Debi Primary Care Anedra Penafiel: [name] Other Clinician: Referring Cionna Collantes: [name] Treating Macarena Langseth/Extender: Frann Rider in Treatment: 18 Active Problems Location of Pain Severity and Description of Pain Patient Has Paino No Site Locations With Dressing Change: No Pain Management and Medication Current Pain Management: Electronic Signature(s) Signed: 01/13/2017 5:02:31 PM By: Alric Quan Entered By: Alric Quan on 01/13/2017 15:52:48 [name] ([id number]) -------------------------------------------------------------------------------- Patient/Caregiver Education Details Patient Name: [name]. Date of Service: 01/13/2017  3:30 PM Medical Record Number: [id number] Patient Account Number: [id number] Date of Birth/Gender: May 27, 1931 (83 y.o. Female) Treating RN: Carolyne Fiscal, Debi Primary Care Physician: [name] Other Clinician: Referring Physician: [name] Treating Physician/Extender: Frann Rider in Treatment: 4 Education Assessment Education Provided To: Patient Education Topics Provided Wound/Skin Impairment: Handouts: Caring for Your Ulcer, Other: HHRN to continue wound care as prescribed Methods: Demonstration, Explain/Verbal Responses: State content correctly Notes Patient was not positioned properly in her wheelchair upon arrival to the wound care center. Electronic Signature(s) Signed: 01/13/2017 5:02:31 PM By: Alric Quan Entered By: Alric Quan on 01/13/2017 16:23:09 [name] ([id number]) --------------------------------------------------------------------------------  Wound Assessment Details Patient Name: [name], [name]. Date of Service: 01/13/2017 3:30 PM Medical Record Number: [id number] Patient Account Number: [id number] Date of Birth/Sex: 1931-06-17 (83 y.o. Female) Treating RN: Carolyne Fiscal, Debi Primary Care Aundrea Higginbotham: [name] Other Clinician: Referring Joquan Lotz: [name] Treating Terri Malerba/Extender: Frann Rider in Treatment: 18 Wound Status Wound Number: 1 Primary Etiology: Pressure Ulcer Wound Location: Right Calcaneus - Medial Wound Status: Open Wounding Event: Pressure Injury Comorbid History: Cataracts, Hypertension Date Acquired: 07/02/2016 Weeks Of Treatment: 18 Clustered Wound: No Wound Measurements Length: (cm) 1.4 Width: (cm) 2 Depth: (cm) 0.2 Area: (cm) 2.199 Volume: (cm) 0.44 % Reduction in Area: 82.2% % Reduction in Volume: 64.4% Epithelialization: Small (1-33%) Tunneling: No Undermining: No Wound Description Classification: Category/Stage III Wound Margin: Flat and Intact Exudate Amount:  Large Exudate Type: Serosanguineous Exudate Color: red, brown Foul Odor After Cleansing: Yes Due to Product Use: No Slough/Fibrino Yes Wound Bed Granulation Amount: Medium (34-66%) Exposed Structure Granulation Quality: Pink Fat Layer (Subcutaneous Tissue) Exposed: Yes Necrotic Amount: Medium (34-66%) Necrotic Quality: Adherent Slough Periwound Skin Texture Texture Color No Abnormalities Noted: No No Abnormalities Noted: No Callus: Yes Atrophie Blanche: No Crepitus: No Cyanosis: No Excoriation: No Ecchymosis: No Induration: No Erythema: No Rash: No Hemosiderin Staining: No Scarring: No Mottled: No Pallor: No Moisture Rubor: No No Abnormalities Noted: No [name], [name]. ([id number]) Dry / Scaly: No Temperature / Pain Maceration: No Temperature: No Abnormality Tenderness on Palpation: Yes Wound Preparation Ulcer Cleansing: Rinsed/Irrigated with Saline Topical Anesthetic Applied: Other: lidocaine 4%, Treatment Notes Wound #1 (Right, Medial Calcaneus) 1. Cleansed with: Clean wound with Normal Saline 4. Dressing Applied: Other dressing (specify in notes) 5. Secondary Dressing Applied Bordered Foam Dressing Dry Gauze Kerlix/Conform 7. Secured with Tape Notes endoform Electronic Signature(s) Signed: 01/13/2017 5:02:31 PM By: Alric Quan Entered By: Alric Quan on 01/13/2017 16:07:04 [name] ([id number]) -------------------------------------------------------------------------------- Wound Assessment Details Patient Name: [name]. Date of Service: 01/13/2017 3:30 PM Medical Record Number: [id number] Patient Account Number: [id number] Date of Birth/Sex: November 11, 1930 (83 y.o. Female) Treating RN: Carolyne Fiscal, Debi Primary Care Malene Blaydes: [name] Other Clinician: Referring Arlisha Patalano: [name] Treating Dmetrius Ambs/Extender: Frann Rider in Treatment: 18 Wound Status Wound Number: 2 Primary Etiology: Pressure Ulcer Wound  Location: Left Calcaneus - Medial Wound Status: Open Wounding Event: Pressure Injury Comorbid History: Cataracts, Hypertension Date Acquired: 07/02/2016 Weeks Of Treatment: 18 Clustered Wound: No Wound Measurements Length: (cm) 2.3 Width: (cm) 3.2 Depth: (cm) 0.8 Area: (cm) 5.781 Volume: (cm) 4.624 % Reduction in Area: 70.8% % Reduction in Volume: -133.7% Epithelialization: None Tunneling: No Undermining: No Wound Description Classification: Category/Stage III Wound Margin: Flat and Intact Exudate Amount: Large Exudate Type: Serosanguineous Exudate Color: red, brown Foul Odor After Cleansing: Yes Due to Product Use: No Slough/Fibrino Yes Wound Bed Granulation Amount: Medium (34-66%) Exposed Structure Granulation Quality: Pink Fat Layer (Subcutaneous Tissue) Exposed: Yes Necrotic Amount: Medium (34-66%) Necrotic Quality: Adherent Slough Periwound Skin Texture Texture Color No Abnormalities Noted: No No Abnormalities Noted: No Callus: No Atrophie Blanche: No Crepitus: No Cyanosis: No Excoriation: No Ecchymosis: No Induration: No Erythema: No Rash: No Hemosiderin Staining: No Scarring: No Mottled: No Pallor: No Moisture Rubor: No No Abnormalities Noted: No [name], [name]. ([id number]) Dry / Scaly: No Temperature / Pain Maceration: Yes Temperature: No Abnormality Tenderness on Palpation: Yes Wound Preparation Ulcer Cleansing: Rinsed/Irrigated with Saline Topical Anesthetic Applied: Other: Lidocaine 4%, Treatment Notes Wound #2 (Left, Medial Calcaneus) 1. Cleansed with: Clean wound with Normal Saline 4. Dressing Applied: Other  dressing (specify in notes) 5. Secondary Dressing Applied Bordered Foam Dressing Dry Gauze Kerlix/Conform 7. Secured with Tape Notes endoform Electronic Signature(s) Signed: 01/13/2017 5:02:31 PM By: Alric Quan Entered By: Alric Quan on 01/13/2017 [name], Danielle [place].  ([id number]) -------------------------------------------------------------------------------- Wound Assessment Details Patient Name: [name]. Date of Service: 01/13/2017 3:30 PM Medical Record Number: [id number] Patient Account Number: [id number] Date of Birth/Sex: Oct 29, 1930 (83 y.o. Female) Treating RN: Carolyne Fiscal, Debi Primary Care Masako Overall: [name] Other Clinician: Referring Paeton Studer: [name] Treating Altin Sease/Extender: Frann Rider in Treatment: 18 Wound Status Wound Number: 7 Primary Etiology: Pressure Ulcer Wound Location: Left Gluteal fold Wound Status: Open Wounding Event: Pressure Injury Comorbid History: Cataracts, Hypertension Date Acquired: 12/24/2016 Weeks Of Treatment: 2 Clustered Wound: No Wound Measurements Length: (cm) 0.5 Width: (cm) 1 Depth: (cm) 0.1 Area: (cm) 0.393 Volume: (cm) 0.039 % Reduction in Area: 86.7% % Reduction in Volume: 93.4% Epithelialization: None Tunneling: No Undermining: No Wound Description Classification: Category/Stage II Wound Margin: Flat and Intact Exudate Amount: Medium Exudate Type: Serous Exudate Color: amber Foul Odor After Cleansing: Yes Due to Product Use: No Slough/Fibrino Yes Wound Bed Granulation Amount: None Present (0%) Exposed Structure Necrotic Amount: Large (67-100%) Fascia Exposed: No Necrotic Quality: Adherent Slough Fat Layer (Subcutaneous Tissue) Exposed: Yes Tendon Exposed: No Muscle Exposed: No Joint Exposed: No Bone Exposed: No Periwound Skin Texture Texture Color No Abnormalities Noted: No No Abnormalities Noted: No Callus: No Atrophie Blanche: No Crepitus: No Cyanosis: No Excoriation: No Ecchymosis: No Induration: No Erythema: No Rash: No Hemosiderin Staining: No [name], [name]. ([id number]) Scarring: No Mottled: No Pallor: No Moisture Rubor: No No Abnormalities Noted: No Dry / Scaly: No Temperature / Pain Maceration: No Temperature: No  Abnormality Tenderness on Palpation: Yes Wound Preparation Ulcer Cleansing: Rinsed/Irrigated with Saline Topical Anesthetic Applied: Other: lidocaine 4%, Treatment Notes Wound #7 (Left Gluteal fold) 1. Cleansed with: Clean wound with Normal Saline 2. Anesthetic Topical Lidocaine 4% cream to wound bed prior to debridement 4. Dressing Applied: Santyl Ointment 5. Secondary Dressing Applied Bordered Foam Dressing Electronic Signature(s) Signed: 01/13/2017 5:02:31 PM By: Alric Quan Entered By: Alric Quan on 01/13/2017 16:02:48 [name] ([id number]) -------------------------------------------------------------------------------- [place] Details Patient Name: [name] Date of Service: 01/13/2017 3:30 PM Medical Record Number: [id number] Patient Account Number: [id number] Date of Birth/Sex: [birth year] (83 y.o. Female) Treating RN: Carolyne Fiscal, Debi Primary Care Annamaria Salah: [name] Other Clinician: Referring Phillips Goulette: [name] Treating Kyndal Gloster/Extender: Frann Rider in Treatment: 18 Vital Signs Time Taken: 15:52 Temperature (F): 98.7 Height (in): 63 Pulse (bpm): 62 Weight (lbs): 160 Respiratory Rate (breaths/min): 18 Body Mass Index (BMI): 28.3 Blood Pressure (mmHg): 105/48 Reference Range: 80 - 120 mg / dl Electronic Signature(s) Signed: 01/13/2017 5:02:31 PM By: Alric Quan Entered By: Alric Quan on 01/13/2017 15:55:11

## 2017-01-15 NOTE — Progress Notes (Signed)
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 within the wound bed. There is a medium (34- 66%) amount of necrotic tissue within the wound bed including Adherent Slough. The periwound skin appearance exhibited: Callus. The periwound skin appearance did not exhibit: Crepitus, Excoriation, Induration, Rash, Scarring, Dry/Scaly, Maceration, Atrophie Blanche, Cyanosis, Ecchymosis, Hemosiderin Staining, Mottled, Pallor, Rubor, Erythema.  Periwound temperature was noted as No Abnormality. The periwound has tenderness on palpation. Wound #2 status is Open. Original cause of wound was Pressure Injury. The wound is located on the Left,Medial Calcaneus. The wound measures 2.3cm length x 3.2cm width x 0.8cm depth; 5.781cm^2  area and 4.624cm^3 volume. There is Fat Layer (Subcutaneous Tissue) Exposed exposed. There is no tunneling or undermining noted. There is a large amount of serosanguineous drainage noted. The wound margin is flat and intact. There is medium (34-66%) pink granulation within the wound bed. There is a medium (34- 66%) amount of necrotic tissue within the wound bed including Adherent Slough. The periwound skin appearance exhibited: Maceration. The periwound skin appearance did not exhibit: Callus, Crepitus, Excoriation, Induration, Rash, Scarring, Dry/Scaly, Atrophie Blanche, Cyanosis, Ecchymosis, Hemosiderin Staining, Mottled, Pallor, Rubor, Erythema. Periwound temperature was noted as No Abnormality. The periwound has tenderness on palpation. Wound #7 status is Open. Original cause of wound was Pressure Injury. The wound is located on the Left Gluteal fold. The wound measures 0.5cm length x 1cm width x 0.1cm depth; 0.393cm^2 area and 0.039cm^3 volume. There is Fat Layer (Subcutaneous Tissue) Exposed exposed. There is no tunneling or undermining noted. There is a medium amount of serous drainage noted. The wound margin is flat and intact. There is no granulation within the wound bed. There is a large (67-100%) amount of necrotic tissue within the wound bed including Adherent Slough. The periwound skin appearance did not exhibit: Callus, Crepitus, Excoriation, Induration, Rash, Scarring, Dry/Scaly, Maceration, Atrophie Blanche, Cyanosis, Ecchymosis, Hemosiderin Staining, Mottled, Pallor, Rubor, Erythema. Periwound temperature was noted as No Abnormality. The periwound has tenderness on palpation. 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 ([id number]) Wound #1 Wound #1 is a Pressure Ulcer located on the Right,Medial Calcaneus . There was a Skin/Subcutaneous Tissue Debridement ([id number]) debridement with total area of 2.8 sq cm performed by Christin Fudge, MD. with the following instrument(s): Curette to remove Viable and Non-Viable tissue/material including Exudate, Fibrin/Slough, and Subcutaneous after achieving pain control using Other (lidocaine 4%). A time out was conducted at 16:00, prior to the start of the procedure. A Minimum amount of bleeding was controlled with Pressure. The procedure was tolerated well with a pain level of 3 throughout and a pain level of 3 following the procedure. Post Debridement Measurements: 1.4cm length x 2cm width x 0.3cm depth; 0.66cm^3 volume. Post debridement Stage noted as Category/Stage III. Character of Wound/Ulcer Post Debridement requires further debridement. Severity of Tissue Post Debridement is: Fat layer exposed. Post procedure Diagnosis Wound #1: Same as Pre-Procedure Wound #2 Wound #2 is a Pressure Ulcer located on the Left,Medial Calcaneus . There was a Skin/Subcutaneous Tissue Debridement ([id number]) debridement with total area of 7.36 sq cm performed by Christin Fudge, MD. with the following instrument(s): Curette to remove Viable and Non-Viable tissue/material including Exudate, Fibrin/Slough, and Subcutaneous after achieving pain control using Other (lidocaine 4%). A time out was conducted at 16:00, prior to the start of the procedure. A Minimum amount of bleeding was controlled with Pressure. The procedure was tolerated well with a pain level of 3 throughout and a pain level of 3 following the procedure. Post Debridement Measurements: 2.3cm length x 3.2cm width x 0.8cm depth; 4.624cm^3 volume. Post debridement  Stage noted as Category/Stage III. Character of Wound/Ulcer Post Debridement requires further debridement. Severity of Tissue  Post Debridement is: Fat layer exposed. Post procedure Diagnosis Wound #2: Same as Pre-Procedure Wound #7 Wound #7 is a Pressure Ulcer located on the Left Gluteal fold . There was a Skin/Subcutaneous Tissue Debridement ([id number]) debridement with total area of 0.5 sq cm performed by Christin Fudge, MD. with the following instrument(s): Curette to remove Viable and Non-Viable tissue/material including Exudate, Fibrin/Slough, and Subcutaneous after achieving pain control using Other (lidocaine 4%). A time out was conducted at 16:00, prior to the start of the procedure. A Minimum amount of bleeding was controlled with Pressure. The procedure was tolerated well with a pain level of 3 throughout and a pain level of 3 following the procedure. Post Debridement Measurements: 0.5cm length x 1cm width x 0.2cm depth; 0.079cm^3 volume. Post debridement Stage noted as Category/Stage II. Character of Wound/Ulcer Post Debridement requires further debridement. Severity of Tissue Post Debridement is: Fat layer exposed. Post procedure Diagnosis Wound #7: Same as Pre-Procedure Plan Wound Cleansing: [name], [name]. ([id number]) Wound #1 Right,Medial Calcaneus: Clean wound with Normal Saline. Wound #2 Left,Medial Calcaneus: Clean wound with Normal Saline. Wound #7 Left Gluteal fold: Clean wound with Normal Saline. Anesthetic: Wound #1 Right,Medial Calcaneus: Topical Lidocaine 4% cream applied to wound bed prior to debridement - in clinic only Wound #2 Left,Medial Calcaneus: Topical Lidocaine 4% cream applied to wound bed prior to debridement - in clinic only Wound #7 Left Gluteal fold: Topical Lidocaine 4% cream applied to wound bed prior to debridement - in clinic only Skin Barriers/Peri-Wound Care: Wound #1 Right,Medial Calcaneus: Skin Prep Wound #2 Left,Medial Calcaneus: Skin Prep Wound #7 Left Gluteal fold: Skin Prep Primary Wound Dressing: Wound #1 Right,Medial Calcaneus: Other: - endoform -  HH to provide this for patient to use Wound #2 Left,Medial Calcaneus: Other: - endoform - HH to provide this for patient to use Wound #7 Left Gluteal fold: Santyl Ointment Secondary Dressing: Wound #1 Right,Medial Calcaneus: Dry Gauze - Foam on tops of feet due to reddened areas. Conform/Kerlix Other - heel cups or ABD pads to support offloading Wound #2 Left,Medial Calcaneus: Dry Gauze - Foam on tops of feet due to reddened areas. Conform/Kerlix Other - heel cups or ABD pads to support offloading Wound #7 Left Gluteal fold: Boardered Foam Dressing Dressing Change Frequency: Wound #1 Right,Medial Calcaneus: Change Dressing Monday, Wednesday, Friday Three times weekly - [facility] Wound #2 Left,Medial Calcaneus: Change Dressing Monday, Wednesday, Friday Three times weekly - [facility] Wound #7 Left Gluteal fold: Change Dressing Monday, Wednesday, Friday Three times weekly - [facility] Follow-up Appointments: Wound #1 Right,Medial Calcaneus: Return Appointment in 1 week. [name], [name] ([id number]) Wound #2 Left,Medial Calcaneus: Return Appointment in 1 week. Wound #7 Left Gluteal fold: Return Appointment in 1 week. Off-Loading: Wound #1 Right,Medial Calcaneus: Other: - float heels when in bed; keep pressure off during the day. Sage Boots at night. Wound #2 Left,Medial Calcaneus: Other: - float heels when in bed; keep pressure off during the day. Sage Boots at night. Wound #7 Left Gluteal fold: Turn and reposition every 2 hours Home Health: Wound #1 Right,Medial Calcaneus: Continue Home Health Visits - [place] Nurse may visit PRN to address patient s wound care needs. FACE TO FACE ENCOUNTER: MEDICARE and MEDICAID PATIENTS: I certify that this patient is under my care and that I had a face-to-face encounter that meets the physician face-to-face encounter requirements with this patient on this date. The encounter  with the patient was in whole or in part for the following  MEDICAL CONDITION: (primary reason for [place]) MEDICAL NECESSITY: I certify, that based on my findings, NURSING services are a medically necessary home health service. HOME BOUND STATUS: I certify that my clinical findings support that this patient is homebound (i.e., Due to illness or injury, pt requires aid of supportive devices such as crutches, cane, wheelchairs, walkers, the use of special transportation or the assistance of another person to leave their place of residence. There is a normal inability to leave the home and doing so requires considerable and taxing effort. Other absences are for medical reasons / religious services and are infrequent or of short duration when for other reasons). If current dressing causes regression in wound condition, may D/C ordered dressing product/s and apply Normal Saline Moist Dressing daily until next [place] / Other MD appointment. [place] of regression in wound condition at [phone]. Please direct any NON-WOUND related issues/requests for orders to patient's Primary Care Physician Wound #2 Left,Medial Calcaneus: [place] Visits - [place] Nurse may visit PRN to address patient s wound care needs. FACE TO FACE ENCOUNTER: MEDICARE and MEDICAID PATIENTS: I certify that this patient is under my care and that I had a face-to-face encounter that meets the physician face-to-face encounter requirements with this patient on this date. The encounter with the patient was in whole or in part for the following MEDICAL CONDITION: (primary reason for [place]) MEDICAL NECESSITY: I certify, that based on my findings, NURSING services are a medically necessary home health service. HOME BOUND STATUS: I certify that my clinical findings support that this patient is homebound (i.e., Due to illness or injury, pt requires aid of supportive devices such as crutches, cane, wheelchairs, walkers, the  use of special transportation or the assistance of another person to leave their place of residence. There is a normal inability to leave the home and doing so requires considerable and taxing effort. Other absences are for medical reasons / religious services and are infrequent or of short duration when for other reasons). If current dressing causes regression in wound condition, may D/C ordered dressing product/s and apply Normal Saline Moist Dressing daily until next [place] / Other MD appointment. [place] of regression in wound condition at [phone]. Please direct any NON-WOUND related issues/requests for orders to patient's Primary Care Physician Wound #7 Left Gluteal fold: [place] Visits - [place] Nurse may visit PRN to address patient s wound care needs. FACE TO FACE ENCOUNTER: MEDICARE and MEDICAID PATIENTS: I certify that this patient is under my care and that I had a face-to-face encounter that meets the physician face-to-face encounter [name], [name] ([id number]) requirements with this patient on this date. The encounter with the patient was in whole or in part for the following MEDICAL CONDITION: (primary reason for [place]) MEDICAL NECESSITY: I certify, that based on my findings, NURSING services are a medically necessary home health service. HOME BOUND STATUS: I certify that my clinical findings support that this patient is homebound (i.e., Due to illness or injury, pt requires aid of supportive devices such as crutches, cane, wheelchairs, walkers, the use of special transportation or the assistance of another person to leave their place of residence. There is a normal inability to leave the home and doing so requires considerable and taxing effort. Other absences are for medical reasons / religious services and are  infrequent or of short duration when for other reasons). If current dressing causes regression  in wound condition, may D/C ordered dressing product/s and apply Normal Saline Moist Dressing daily until next [place] / Other MD appointment. [place] of regression in wound condition at [phone]. Please direct any NON-WOUND related issues/requests for orders to patient's Primary Care Physician Medications-please add to medication list.: Wound #7 Left Gluteal fold: Santyl Enzymatic Ointment She has reached a stage where we will try and get her some skin substitute samples to hasten the process of healing. Her insurance will not cover this so we are trying to get some samples from the vendors -- this has not yet happened In the meanwhile I have recommended: 1. Endoform to both heels, heel cups and offloading with Sage boots. 2. Adequate proteins, vitamin A, vitamin C and zinc. 3. Santyl ointment and a bordered foam to the left gluteal region. 4. Offloading has been stressed with her again in great detail especially about change in position while sitting and sleeping Electronic Signature(s) Signed: 01/13/2017 4:21:39 PM By: Christin Fudge MD, FACS Entered By: Christin Fudge on 01/13/2017 16:21:39 [name] ([id number]) -------------------------------------------------------------------------------- SuperBill Details Patient Name: [name]. Date of Service: 01/13/2017 Medical Record Number: [id number] Patient Account Number: [id number] Date of Birth/Sex: 05/20/1931 (83 y.o. Female) Treating RN: Carolyne Fiscal, Debi Primary Care Provider: [name] Other Clinician: Referring Provider: [name] Treating Provider/Extender: Frann Rider in Treatment: 18 Diagnosis Coding ICD-10 Codes Code Description L89.620 Pressure ulcer of left heel, unstageable L89.610 Pressure ulcer of right heel, unstageable L97.512 Non-pressure chronic ulcer of other part of right foot with fat layer exposed Z99.3 Dependence on wheelchair L89.322  Pressure ulcer of left buttock, stage 2 Facility Procedures CPT4 Code Description: [id number] 11042 - DEB SUBQ TISSUE 20 SQ CM/< ICD-10 Description Diagnosis L89.620 Pressure ulcer of left heel, unstageable L97.512 Non-pressure chronic ulcer of other part of right fo L89.610 Pressure ulcer of right heel,  unstageable L89.322 Pressure ulcer of left buttock, stage 2 Modifier: ot with fat la Quantity: 1 yer exposed Physician Procedures CPT4 Code Description: [id number] 11042 - WC PHYS SUBQ TISS 20 SQ CM ICD-10 Description Diagnosis L89.620 Pressure ulcer of left heel, unstageable L97.512 Non-pressure chronic ulcer of other part of right fo L89.610 Pressure ulcer of right heel, unstageable  L89.322 Pressure ulcer of left buttock, stage 2 Modifier: ot with fat lay Quantity: 1 er exposed Electronic Signature(s) Signed: 01/13/2017 4:21:50 PM By: Christin Fudge MD, FACS Entered By: Christin Fudge on 01/13/2017 16:21:50

## 2017-01-20 ENCOUNTER — Encounter: Payer: Medicare HMO | Attending: Surgery | Admitting: Surgery

## 2017-01-20 DIAGNOSIS — I129 Hypertensive chronic kidney disease with stage 1 through stage 4 chronic kidney disease, or unspecified chronic kidney disease: Secondary | ICD-10-CM | POA: Insufficient documentation

## 2017-01-20 DIAGNOSIS — L97512 Non-pressure chronic ulcer of other part of right foot with fat layer exposed: Secondary | ICD-10-CM | POA: Insufficient documentation

## 2017-01-20 DIAGNOSIS — L89610 Pressure ulcer of right heel, unstageable: Secondary | ICD-10-CM | POA: Insufficient documentation

## 2017-01-20 DIAGNOSIS — Z993 Dependence on wheelchair: Secondary | ICD-10-CM | POA: Insufficient documentation

## 2017-01-20 DIAGNOSIS — L89322 Pressure ulcer of left buttock, stage 2: Secondary | ICD-10-CM | POA: Insufficient documentation

## 2017-01-20 DIAGNOSIS — N189 Chronic kidney disease, unspecified: Secondary | ICD-10-CM | POA: Insufficient documentation

## 2017-01-20 DIAGNOSIS — D649 Anemia, unspecified: Secondary | ICD-10-CM | POA: Diagnosis not present

## 2017-01-20 DIAGNOSIS — L89620 Pressure ulcer of left heel, unstageable: Secondary | ICD-10-CM | POA: Insufficient documentation

## 2017-01-21 NOTE — Progress Notes (Addendum)
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 2.3cm width x 0.2cm depth; 2.348cm^2 area and 0.47cm^3 volume. There is Fat Layer (Subcutaneous Tissue) Exposed exposed. There is no tunneling or undermining noted. There is a large amount of serosanguineous drainage noted. The wound margin is flat and intact. There is medium (34-66%) pink granulation within the wound bed. There is a medium (34- 66%) amount of necrotic  tissue within the wound bed including Adherent Slough. The periwound skin appearance exhibited: Callus. The periwound skin appearance did not exhibit: Crepitus, Excoriation, Induration, Rash, Scarring, Dry/Scaly, Maceration, Atrophie Blanche, Cyanosis, Ecchymosis, Hemosiderin Staining, Mottled, Pallor, Rubor, Erythema. Periwound temperature was noted as No Abnormality. The periwound has tenderness on palpation. Wound #2 status is Open. Original cause of wound was  Pressure Injury. The wound is located on the Left,Medial Calcaneus. The wound measures 2cm length x 2.5cm width x 0.8cm depth; 3.927cm^2 area and 3.142cm^3 volume. There is Fat Layer (Subcutaneous Tissue) Exposed exposed. There is no tunneling or undermining noted. There is a large amount of serosanguineous drainage noted. The wound margin is flat and intact. There is medium (34-66%) pink granulation within the wound bed. There is a medium (34- 66%) amount of necrotic tissue within the wound bed including Adherent Slough. The periwound skin appearance exhibited: Maceration. The periwound skin appearance did not exhibit: Callus, Crepitus, Excoriation, Induration, Rash, Scarring, Dry/Scaly, Atrophie Blanche, Cyanosis, Ecchymosis, Hemosiderin Staining, Mottled, Pallor, Rubor, Erythema. Periwound temperature was noted as No Abnormality. The periwound has tenderness on palpation. Wound #7 status is Open. Original cause of wound was Pressure Injury. The wound is located on the Left Gluteal fold. The wound measures 0.8cm length x 0.8cm width x 0.2cm depth; 0.503cm^2 area and 0.101cm^3 volume. There is Fat Layer (Subcutaneous Tissue) Exposed exposed. There is no tunneling or undermining noted. There is a medium amount of serous drainage noted. The wound margin is flat and intact. There is medium (34-66%) pink, pale granulation within the wound bed. There is a small (1-33%) amount of necrotic tissue within the wound bed including Adherent Slough. The periwound skin appearance did not exhibit: Callus, Crepitus, Excoriation, Induration, Rash, Scarring, Dry/Scaly, Maceration, Atrophie Blanche, Cyanosis, Ecchymosis, Hemosiderin Staining, Mottled, Pallor, Rubor, Erythema. Periwound temperature was noted as No Abnormality. The periwound has tenderness on palpation. 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 ([id number]) Procedures Wound #1 Wound #1 is a Pressure Ulcer located on the Right,Medial Calcaneus . There was a Skin/Subcutaneous Tissue Debridement ([id number]) debridement with total area of 2.99 sq cm performed by Christin Fudge, MD. with the following instrument(s): Curette to remove Non-Viable tissue/material including Exudate, Fat Layer (and Subcutaneous Tissue) Exposed, Fibrin/Slough, Eschar, and Subcutaneous after achieving pain control using Lidocaine 4% Topical Solution. A time out was conducted at 09:55, prior to the start of the procedure. A Minimum amount of bleeding was controlled with Pressure. The procedure was tolerated well with a pain level of 0 throughout and a pain level of 0 following the procedure. Post Debridement Measurements: 1.3cm length x 2.3cm width x 0.2cm depth; 0.47cm^3 volume. Post debridement Stage noted as Category/Stage III. Character of Wound/Ulcer Post Debridement requires further debridement. Severity of Tissue Post Debridement is: Fat layer exposed. Post procedure Diagnosis Wound #1: Same as Pre-Procedure Wound #2 Wound #2 is a Pressure Ulcer located on the Left,Medial Calcaneus . There was a Skin/Subcutaneous Tissue Debridement ([id number]) debridement with total area of 5 sq cm performed by Christin Fudge, MD. with the following instrument(s): Curette to remove Non-Viable tissue/material including Exudate, Fat Layer (and Subcutaneous Tissue) Exposed, Fibrin/Slough, Eschar, and Subcutaneous after achieving pain control using Lidocaine 4% Topical Solution. A time out was conducted at  09:55, prior to the start of the procedure. A Minimum amount of bleeding was controlled with Pressure. The procedure was tolerated well with a pain level of 0 throughout and a pain level of 0  following the procedure. Post Debridement Measurements: 2cm length x 2.5cm width x 0.2cm depth; 0.785cm^3 volume. Post debridement Stage noted as Category/Stage III. Character of Wound/Ulcer Post Debridement requires further debridement. Severity of Tissue Post Debridement is: Fat layer exposed. Post procedure Diagnosis Wound #2: Same as Pre-Procedure Wound #7 Wound #7 is a Pressure Ulcer located on the Left Gluteal fold . There was a Skin/Subcutaneous Tissue Debridement ([id number]) debridement with total area of 0.64 sq cm performed by Christin Fudge, MD. with the following instrument(s): Curette to remove Non-Viable tissue/material including Exudate, Fat Layer (and Subcutaneous Tissue) Exposed, Fibrin/Slough, Eschar, and Subcutaneous after achieving pain control using Lidocaine 4% Topical Solution. A time out was conducted at 09:55, prior to the start of the procedure. A Minimum amount of bleeding was controlled with Pressure. The procedure was tolerated well with a pain level of 0 throughout and a pain level of 0 following the procedure. Post Debridement Measurements: 0.8cm length x 0.8cm width x 0.2cm depth; 0.101cm^3 volume. Post debridement Stage noted as Category/Stage II. Character of Wound/Ulcer Post Debridement requires further debridement. Severity of Tissue Post Debridement is: Fat layer exposed. Post procedure Diagnosis Wound #7: Same as Pre-Procedure [name], [name]. ([id number]) Plan Wound Cleansing: Wound #1 Right,Medial Calcaneus: Clean wound with Normal Saline. Wound #2 Left,Medial Calcaneus: Clean wound with Normal Saline. Wound #7 Left Gluteal fold: Clean wound with Normal Saline. Anesthetic: Wound #1 Right,Medial Calcaneus: Topical Lidocaine 4% cream applied to wound bed prior to debridement - in clinic only Wound #2 Left,Medial Calcaneus: Topical Lidocaine 4% cream applied to wound bed prior to debridement - in clinic only Wound #7 Left Gluteal fold: Topical  Lidocaine 4% cream applied to wound bed prior to debridement - in clinic only Skin Barriers/Peri-Wound Care: Wound #1 Right,Medial Calcaneus: Skin Prep Wound #2 Left,Medial Calcaneus: Skin Prep Wound #7 Left Gluteal fold: Skin Prep Primary Wound Dressing: Wound #1 Right,Medial Calcaneus: Aquacel Ag Wound #2 Left,Medial Calcaneus: Aquacel Ag Wound #7 Left Gluteal fold: Aquacel Ag Secondary Dressing: Wound #1 Right,Medial Calcaneus: Dry Gauze - Foam on tops of feet due to reddened areas. Conform/Kerlix Other - heel cups or ABD pads to support offloading Wound #2 Left,Medial Calcaneus: Dry Gauze - Foam on tops of feet due to reddened areas. Conform/Kerlix Other - heel cups or ABD pads to support offloading Wound #7 Left Gluteal fold: Boardered Foam Dressing Dressing Change Frequency: Wound #1 Right,Medial Calcaneus: Change Dressing Monday, Wednesday, Friday Three times weekly - [facility] Wound #2 Left,Medial Calcaneus: Change Dressing Monday, Wednesday, Friday [name], [name]. ([id number]) Three times weekly - [facility] Wound #7 Left Gluteal fold: Change Dressing Monday, Wednesday, Friday Three times weekly - [facility] Follow-up Appointments: Wound #1 Right,Medial Calcaneus: Return Appointment in 1 week. Wound #2 Left,Medial Calcaneus: Return Appointment in 1 week. Wound #7 Left Gluteal fold: Return Appointment in 1 week. Off-Loading: Wound #1 Right,Medial Calcaneus: Other: - float heels when in bed; keep pressure off during the day. Sage Boots at night. Wound #2 Left,Medial Calcaneus: Other: - float heels when in bed; keep pressure off during the day. Sage Boots at night. Wound #7 Left Gluteal fold: Turn and reposition every 2 hours Home Health: Wound #1 Right,Medial Calcaneus: Continue Home Health Visits - [place] Nurse may visit PRN to address patient s wound care needs.  FACE TO FACE ENCOUNTER: MEDICARE and MEDICAID PATIENTS: I certify that this patient is under my  care and that I had a face-to-face encounter that meets the physician face-to-face encounter requirements with this patient on this date. The encounter with the patient was in whole or in part for the following MEDICAL CONDITION: (primary reason for [place]) MEDICAL NECESSITY: I certify, that based on my findings, NURSING services are a medically necessary home health service. HOME BOUND STATUS: I certify that my clinical findings support that this patient is homebound (i.e., Due to illness or injury, pt requires aid of supportive devices such as crutches, cane, wheelchairs, walkers, the use of special transportation or the assistance of another [name] to leave their place of residence. There is a normal inability to leave the home and doing so requires considerable and taxing effort. Other absences are for medical reasons / religious services and are infrequent or of short duration when for other reasons). If current dressing causes regression in wound condition, may D/C ordered dressing product/s and apply Normal Saline Moist Dressing daily until next [place] / Other MD appointment. [place] of regression in wound condition at [phone]. Please direct any NON-WOUND related issues/requests for orders to patient's Primary Care Physician Wound #2 Left,Medial Calcaneus: [place] Visits - [place] Nurse may visit PRN to address patient s wound care needs. FACE TO FACE ENCOUNTER: MEDICARE and MEDICAID PATIENTS: I certify that this patient is under my care and that I had a face-to-face encounter that meets the physician face-to-face encounter requirements with this patient on this date. The encounter with the patient was in whole or in part for the following MEDICAL CONDITION: (primary reason for [place]) MEDICAL NECESSITY: I certify, that based on my findings, NURSING services are a medically necessary home health service.  HOME BOUND STATUS: I certify that my clinical findings support that this patient is homebound (i.e., Due to illness or injury, pt requires aid of supportive devices such as crutches, cane, wheelchairs, walkers, the use of special transportation or the assistance of another [name] to leave their place of residence. There is a normal inability to leave the home and doing so requires considerable and taxing effort. Other absences are for medical reasons / religious services and are infrequent or of short duration when for other reasons). If current dressing causes regression in wound condition, may D/C ordered dressing product/s and apply Normal Saline Moist Dressing daily until next [place] / Other MD appointment. Notify Wound [name], [name] ([id number]) [name] of regression in wound condition at [phone]. Please direct any NON-WOUND related issues/requests for orders to patient's Primary Care Physician Wound #7 Left Gluteal fold: [place] Visits - [place] Nurse may visit PRN to address patient s wound care needs. FACE TO FACE ENCOUNTER: MEDICARE and MEDICAID PATIENTS: I certify that this patient is under my care and that I had a face-to-face encounter that meets the physician face-to-face encounter requirements with this patient on this date. The encounter with the patient was in whole or in part for the following MEDICAL CONDITION: (primary reason for [place]) MEDICAL NECESSITY: I certify, that based on my findings, NURSING services are a medically necessary home health service. HOME BOUND STATUS: I certify that my clinical findings support that this patient is homebound (i.e., Due to illness or injury, pt requires aid of supportive devices such as crutches, cane, wheelchairs, walkers, the use of special transportation or  the assistance of another [name] to leave their place of residence. There is a normal inability to leave the home and  doing so requires considerable and taxing effort. Other absences are for medical reasons / religious services and are infrequent or of short duration when for other reasons). If current dressing causes regression in wound condition, may D/C ordered dressing product/s and apply Normal Saline Moist Dressing daily until next [place] / Other MD appointment. [place] of regression in wound condition at [phone]. Please direct any NON-WOUND related issues/requests for orders to patient's Primary Care Physician Medications-please add to medication list.: Wound #7 Left Gluteal fold: Santyl Enzymatic Ointment She has reached a stage where we will try and get her some skin substitute samples to hasten the process of healing. We are trying to get some samples from the vendors -- this will be available next week In the meanwhile I have recommended: 1. Silver alginate to both heels, heel cups and offloading with Sage boots. 2. Adequate proteins, vitamin A, vitamin C and zinc. 3. Santyl ointment and a bordered foam to the left gluteal region. 4. Offloading has been stressed with her again in great detail especially about change in position while sitting and sleeping Electronic Signature(s) Signed: 01/20/2017 4:32:06 PM By: Christin Fudge MD, FACS Previous Signature: 01/20/2017 10:13:44 AM Version By: Christin Fudge MD, FACS Entered By: Christin Fudge on 01/20/2017 16:32:06 [name] ([id number]) -------------------------------------------------------------------------------- SuperBill Details Patient Name: [name]. Date of Service: 01/20/2017 Medical Record Number: [id number] Patient Account Number: [id number] Date of Birth/Sex: 08/25/1931 (83 y.o. Female) Treating RN: Afful, RN, BSN, [place] Primary Care Provider: [name] Other Clinician: Referring Provider: [name] Treating Provider/Extender: Frann Rider in Treatment: 19 Diagnosis  Coding ICD-10 Codes Code Description L89.620 Pressure ulcer of left heel, unstageable L89.610 Pressure ulcer of right heel, unstageable L97.512 Non-pressure chronic ulcer of other part of right foot with fat layer exposed Z99.3 Dependence on wheelchair L89.322 Pressure ulcer of left buttock, stage 2 Facility Procedures CPT4 Code Description: [id number] 11042 - DEB SUBQ TISSUE 20 SQ CM/< ICD-10 Description Diagnosis L89.620 Pressure ulcer of left heel, unstageable L89.610 Pressure ulcer of right heel, unstageable 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 L89.322 Pressure ulcer of left buttock, stage 2 Modifier: ot with fat la Quantity: 1 yer exposed Physician Procedures CPT4 Code Description: [id number] 11042 - WC PHYS SUBQ TISS 20 SQ CM ICD-10 Description Diagnosis L89.620 Pressure ulcer of left heel, unstageable L89.610 Pressure ulcer of right heel, unstageable 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o L89.322 Pressure ulcer of left buttock, stage 2 Modifier: t with fat lay Quantity: 1 er exposed Electronic Signature(s) Signed: 01/20/2017 10:14:02 AM By: Christin Fudge MD, FACS Entered By: Christin Fudge on 01/20/2017 10:14:01

## 2017-01-21 NOTE — Progress Notes (Signed)
[name], [name] ([id number]) Visit Report for 01/20/2017 Arrival Information Details Patient Name: [name], [name]. Date of Service: 01/20/2017 9:15 AM Medical Record Number: [id number] Patient Account Number: [id number] Date of Birth/Sex: 05-06-1931 (83 y.o. Female) Treating RN: Afful, RN, BSN, Velva Harman Primary Care Benjamin Merrihew: [name] Other Clinician: Referring Keithon Mccoin: [name] Treating Reida Hem/Extender: Frann Rider in Treatment: 88 Visit Information History Since Last Visit All ordered tests and consults were completed: No Patient Arrived: Wheel Chair Added or deleted any medications: No Arrival Time: 09:35 Any new allergies or adverse reactions: No Accompanied By: careigver Had a fall or experienced change in No activities of daily living that may affect Transfer Assistance: Harrel Lemon Lift risk of falls: Patient Identification Verified: Yes Signs or symptoms of abuse/neglect since last No Secondary Verification Process Yes visito Completed: Hospitalized since last visit: No Patient Requires Transmission-Based No Has Dressing in Place as Prescribed: Yes Precautions: Pain Present Now: Yes Patient Has Alerts: No Electronic Signature(s) Signed: 01/20/2017 3:20:12 PM By: Regan Lemming BSN, RN Entered By: Regan Lemming on 01/20/2017 09:40:43 [name] ([id number]) -------------------------------------------------------------------------------- Encounter Discharge Information Details Patient Name: [name]. Date of Service: 01/20/2017 9:15 AM Medical Record Number: [id number] Patient Account Number: [id number] Date of Birth/Sex: 1931/09/10 (83 y.o. Female) Treating RN: Afful, RN, BSN, Velva Harman Primary Care Anvita Hirata: [name] Other Clinician: Referring Nicolena Schurman: [name] Treating Leeam Cedrone/Extender: Frann Rider in Treatment: 41 Encounter Discharge Information Items Discharge Pain Level: 0 Discharge Condition: Stable Ambulatory Status:  Wheelchair Discharge Destination: Home Transportation: Other Accompanied By: CAREGIVER Schedule Follow-up Appointment: No Medication Reconciliation completed and provided to Patient/Care No Petula Rotolo: Provided on Clinical Summary of Care: 01/20/2017 Form Type Recipient Paper Patient EB Electronic Signature(s) Signed: 01/20/2017 1:05:09 PM By: Regan Lemming BSN, RN Previous Signature: 01/20/2017 10:20:28 AM Version By: Ruthine Dose Entered By: Regan Lemming on 01/20/2017 13:05:08 [name] ([id number]) -------------------------------------------------------------------------------- Lower Extremity Assessment Details Patient Name: [name]. Date of Service: 01/20/2017 9:15 AM Medical Record Number: [id number] Patient Account Number: [id number] Date of Birth/Sex: [birth year] (83 y.o. Female) Treating RN: Afful, RN, BSN, Velva Harman Primary Care Emilea Goga: [name] Other Clinician: Referring Margaretta Chittum: [name] Treating Camdynn Maranto/Extender: Frann Rider in Treatment: 19 Vascular Assessment Pulses: Dorsalis Pedis Palpable: [Left:Yes] [Right:Yes] Posterior Tibial Extremity colors, hair growth, and conditions: Extremity Color: [Left:Dusky] [Right:Dusky] Hair Growth on Extremity: [Left:No] [Right:No] Temperature of Extremity: [Left:Warm] [Right:Warm] Capillary Refill: [Left:< 3 seconds] [Right:< 3 seconds] Toe Nail Assessment Left: Right: Thick: Yes Yes Discolored: Yes Yes Deformed: Yes Yes Improper Length and Hygiene: Yes Yes Electronic Signature(s) Signed: 01/20/2017 3:20:12 PM By: Regan Lemming BSN, RN Entered By: Regan Lemming on 01/20/2017 09:52:59 [name] ([id number]) -------------------------------------------------------------------------------- Multi Wound Chart Details Patient Name: [name]. Date of Service: 01/20/2017 9:15 AM Medical Record Number: [id number] Patient Account Number: [id number] Date of Birth/Sex: May 24, 1931 (83 y.o.  Female) Treating RN: Baruch Gouty, RN, BSN, Velva Harman Primary Care Adriene Knipfer: [name] Other Clinician: Referring Danniell Rotundo: [name] Treating Breon Rehm/Extender: Frann Rider in Treatment: 19 Vital Signs Height(in): 63 Pulse(bpm): 58 Weight(lbs): 160 Blood Pressure 97/42 (mmHg): Body Mass Index(BMI): 28 Temperature(F): 97.4 Respiratory Rate 16 (breaths/min): Photos: [1:No Photos] [2:No Photos] [7:No Photos] Wound Location: [1:Right Calcaneus - Medial] [2:Left Calcaneus - Medial] [7:Left Gluteal fold] Wounding Event: [1:Pressure Injury] [2:Pressure Injury] [7:Pressure Injury] Primary Etiology: [1:Pressure Ulcer] [2:Pressure Ulcer] [7:Pressure Ulcer] Comorbid History: [1:Cataracts, Hypertension] [2:Cataracts, Hypertension] [7:Cataracts, Hypertension] Date Acquired: [1:07/02/2016] [2:07/02/2016] [7:12/24/2016] Weeks of Treatment: [1:19] [2:19] [7:3] Wound Status: [1:Open] [2:Open] [7:Open] Measurements L  x W x D 1.3x2.3x0.2 [2:2x2.5x0.8] [7:0.8x0.8x0.2] (cm) Area (cm) : [1:2.348] [2:3.927] [7:0.503] Volume (cm) : [1:0.47] [2:3.142] [7:0.101] % Reduction in Area: [1:81.00%] [2:80.20%] [7:82.90%] % Reduction in Volume: 62.00% [2:-58.80%] [7:82.90%] Classification: [1:Category/Stage III] [2:Category/Stage III] [7:Category/Stage II] Exudate Amount: [1:Large] [2:Large] [7:Medium] Exudate Type: [1:Serosanguineous] [2:Serosanguineous] [7:Serous] Exudate Color: [1:red, brown] [2:red, brown] [7:amber] Foul Odor After [1:Yes] [2:Yes] [7:Yes] Cleansing: Odor Anticipated Due to No [2:No] [7:No] Product Use: Wound Margin: [1:Flat and Intact] [2:Flat and Intact] [7:Flat and Intact] Granulation Amount: [1:Medium (34-66%)] [2:Medium (34-66%)] [7:Medium (34-66%)] Granulation Quality: [1:Pink] [2:Pink] [7:Pink, Pale] Necrotic Amount: [1:Medium (34-66%)] [2:Medium (34-66%)] [7:Small (1-33%)] Exposed Structures: [1:Fat Layer (Subcutaneous Tissue) Exposed: Yes] [2:Fat Layer  (Subcutaneous Tissue) Exposed: Yes] [7:Fat Layer (Subcutaneous Tissue) Exposed: Yes Fascia: No Tendon: No] Muscle: No Joint: No Bone: No Epithelialization: Small (1-33%) None None Debridement: Debridement (20254- Debridement (27062- Debridement (11042- 11047) 11047) 11047) Pre-procedure 09:55 09:55 09:55 Verification/Time Out Taken: Pain Control: Lidocaine 4% Topical Lidocaine 4% Topical Lidocaine 4% Topical Solution Solution Solution Tissue Debrided: Necrotic/Eschar, Necrotic/Eschar, Necrotic/Eschar, Fibrin/Slough, Fat, Fibrin/Slough, Fat, Fibrin/Slough, Fat, Exudates, Subcutaneous Exudates, Subcutaneous Exudates, Subcutaneous Level: Skin/Subcutaneous Skin/Subcutaneous Skin/Subcutaneous Tissue Tissue Tissue Debridement Area (sq 2.99 5 0.64 cm): Instrument: Curette Curette Curette Bleeding: Minimum Minimum Minimum Hemostasis Achieved: Pressure Pressure Pressure Procedural Pain: 0 0 0 Post Procedural Pain: 0 0 0 Debridement Treatment Procedure was tolerated Procedure was tolerated Procedure was tolerated Response: well well well Post Debridement 1.3x2.3x0.2 2x2.5x0.2 0.8x0.8x0.2 Measurements L x W x D (cm) Post Debridement 0.47 0.785 0.101 Volume: (cm) Post Debridement Category/Stage III Category/Stage III Category/Stage II Stage: Periwound Skin Texture: Callus: Yes Excoriation: No Excoriation: No Excoriation: No Induration: No Induration: No Induration: No Callus: No Callus: No Crepitus: No Crepitus: No Crepitus: No Rash: No Rash: No Rash: No Scarring: No Scarring: No Scarring: No Periwound Skin Maceration: No Maceration: Yes Maceration: No Moisture: Dry/Scaly: No Dry/Scaly: No Dry/Scaly: No Periwound Skin Color: Atrophie Blanche: No Atrophie Blanche: No Atrophie Blanche: No Cyanosis: No Cyanosis: No Cyanosis: No Ecchymosis: No Ecchymosis: No Ecchymosis: No Erythema: No Erythema: No Erythema: No Hemosiderin Staining: No Hemosiderin Staining:  No Hemosiderin Staining: No Mottled: No Mottled: No Mottled: No Pallor: No Pallor: No Pallor: No Rubor: No Rubor: No Rubor: No Temperature: No Abnormality No Abnormality No Abnormality Yes Yes Yes [name], Danielle S. ([id number]) Tenderness on Palpation: Wound Preparation: Ulcer Cleansing: Ulcer Cleansing: Ulcer Cleansing: Rinsed/Irrigated with Rinsed/Irrigated with Rinsed/Irrigated with Saline Saline Saline Topical Anesthetic Topical Anesthetic Topical Anesthetic Applied: Other: lidocaine Applied: Other: Lidocaine Applied: Other: lidocaine 4% 4% 4% Procedures Performed: Debridement Debridement Debridement Treatment Notes Electronic Signature(s) Signed: 01/20/2017 10:10:24 AM By: Christin Fudge MD, FACS Entered By: Christin Fudge on 01/20/2017 10:10:24 [name] ([id number]) -------------------------------------------------------------------------------- [place] Details Patient Name: [name] Date of Service: 01/20/2017 9:15 AM Medical Record Number: [id number] Patient Account Number: [id number] Date of Birth/Sex: [birth year] (83 y.o. Female) Treating RN: Afful, RN, BSN, Velva Harman Primary Care Welborn Keena: [name] Other Clinician: Referring Michelena Culmer: [name] Treating Elantra Caprara/Extender: Frann Rider in Treatment: 47 Active Inactive ` Abuse / Safety / Falls / Self Care Management Nursing Diagnoses: Impaired physical mobility Potential for falls Goals: Patient will remain injury free Date Initiated: 09/06/2016 Target Resolution Date: 12/02/2016 Goal Status: Active Interventions: Assess fall risk on admission and as needed Assess self care needs on admission and as needed Notes: ` Necrotic Tissue Nursing Diagnoses: Impaired tissue integrity related to necrotic/devitalized tissue Goals: Necrotic/devitalized tissue will be minimized in the wound bed Date Initiated:  09/06/2016 Target Resolution Date: 12/02/2016 Goal Status:  Active Interventions: Assess patient pain level pre-, during and post procedure and prior to discharge Notes: ` Orientation to the Wound Care Program Nursing Diagnoses: [name], [name] ([id number]) Knowledge deficit related to the wound healing center program Goals: Patient/caregiver will verbalize understanding of the [place] Date Initiated: 09/06/2016 Target Resolution Date: 12/02/2016 Goal Status: Active Interventions: Provide education on orientation to the wound center Notes: ` Pressure Nursing Diagnoses: Knowledge deficit related to management of pressures ulcers Potential for impaired tissue integrity related to pressure, friction, moisture, and shear Goals: Patient will remain free of pressure ulcers Date Initiated: 09/06/2016 Target Resolution Date: 12/02/2016 Goal Status: Active Interventions: Assess: immobility, friction, shearing, incontinence upon admission and as needed Notes: ` Soft Tissue Infection Nursing Diagnoses: Impaired tissue integrity Potential for infection: soft tissue Goals: Patient will remain free of wound infection Date Initiated: 09/06/2016 Target Resolution Date: 12/02/2016 Goal Status: Active Interventions: Assess signs and symptoms of infection every visit Notes: ` Wound/Skin Impairment [name], [name] ([id number]) Nursing Diagnoses: Impaired tissue integrity Goals: Ulcer/skin breakdown will heal within 14 weeks Date Initiated: 09/06/2016 Target Resolution Date: 12/16/2016 Goal Status: Active Interventions: Assess ulceration(s) every visit Notes: Electronic Signature(s) Signed: 01/20/2017 3:20:12 PM By: Regan Lemming BSN, RN Entered By: Regan Lemming on 01/20/2017 09:57:48 [name] ([id number]) -------------------------------------------------------------------------------- Pain Assessment Details Patient Name: [name]. Date of Service: 01/20/2017 9:15 AM Medical Record Number: [id number] Patient  Account Number: [id number] Date of Birth/Sex: Mar 06, 1931 (83 y.o. Female) Treating RN: Afful, RN, BSN, Velva Harman Primary Care Kanyon Bunn: [name] Other Clinician: Referring Alayzia Pavlock: [name] Treating Chamar Broughton/Extender: Frann Rider in Treatment: 19 Active Problems Location of Pain Severity and Description of Pain Patient Has Paino Yes Site Locations Pain Location: Pain in Ulcers With Dressing Change: Yes Rate the pain. Current Pain Level: 4 Character of Pain Describe the Pain: Tender Pain Management and Medication Current Pain Management: Electronic Signature(s) Signed: 01/20/2017 3:20:12 PM By: Regan Lemming BSN, RN Entered By: Regan Lemming on 01/20/2017 09:40:55 [name] ([id number]) -------------------------------------------------------------------------------- Patient/Caregiver Education Details Patient Name: [name]. Date of Service: 01/20/2017 9:15 AM Medical Record Number: [id number] Patient Account Number: [id number] Date of Birth/Gender: 01/15/1931 (83 y.o. Female) Treating RN: Baruch Gouty, RN, BSN, Velva Harman Primary Care Physician: [name] Other Clinician: Referring Physician: [name] Treating Physician/Extender: Frann Rider in Treatment: 83 Education Assessment Education Provided To: Patient Education Topics Provided Welcome To [facility]: Methods: Explain/Verbal Responses: State content correctly Wound Debridement: Methods: Explain/Verbal Responses: State content correctly Wound/Skin Impairment: Methods: Explain/Verbal Responses: State content correctly Electronic Signature(s) Signed: 01/20/2017 3:20:12 PM By: Regan Lemming BSN, RN Entered By: Regan Lemming on 01/20/2017 13:05:30 [name] ([id number]) -------------------------------------------------------------------------------- Wound Assessment Details Patient Name: [name]. Date of Service: 01/20/2017 9:15 AM Medical Record Number:  [id number] Patient Account Number: [id number] Date of Birth/Sex: 1930/11/16 (83 y.o. Female) Treating RN: Afful, RN, BSN, [place] Primary Care Nehemiah Mcfarren: [name] Other Clinician: Referring Gyselle Matthew: [name] Treating Manon Banbury/Extender: Frann Rider in Treatment: 19 Wound Status Wound Number: 1 Primary Etiology: Pressure Ulcer Wound Location: Right Calcaneus - Medial Wound Status: Open Wounding Event: Pressure Injury Comorbid History: Cataracts, Hypertension Date Acquired: 07/02/2016 Weeks Of Treatment: 19 Clustered Wound: No Photos Photo Uploaded By: Regan Lemming on 01/20/2017 15:03:56 Wound Measurements Length: (cm) 1.3 Width: (cm) 2.3 Depth: (cm) 0.2 Area: (cm) 2.348 Volume: (cm) 0.47 % Reduction in Area: 81% % Reduction in Volume: 62% Epithelialization: Small (1-33%)  Tunneling: No Undermining: No Wound Description Classification: Category/Stage III Foul Odor Aft Wound Margin: Flat and Intact Due to Produc Exudate Amount: Large Slough/Fibrin Exudate Type: Serosanguineous Exudate Color: red, brown er Cleansing: Yes t Use: No o Yes Wound Bed Granulation Amount: Medium (34-66%) Exposed Structure Granulation Quality: Pink Fat Layer (Subcutaneous Tissue) Exposed: Yes Necrotic Amount: Medium (34-66%) Necrotic Quality: [address] [place] ([id number]) Periwound Skin Texture Texture Color No Abnormalities Noted: No No Abnormalities Noted: No Callus: Yes Atrophie Blanche: No Crepitus: No Cyanosis: No Excoriation: No Ecchymosis: No Induration: No Erythema: No Rash: No Hemosiderin Staining: No Scarring: No Mottled: No Pallor: No Moisture Rubor: No No Abnormalities Noted: No Dry / Scaly: No Temperature / Pain Maceration: No Temperature: No Abnormality Tenderness on Palpation: Yes Wound Preparation Ulcer Cleansing: Rinsed/Irrigated with Saline Topical Anesthetic Applied: Other: lidocaine 4%, Treatment Notes Wound #1  (Right, Medial Calcaneus) 1. Cleansed with: Clean wound with Normal Saline 3. Peri-wound Care: Skin Prep 4. Dressing Applied: Aquacel Ag 5. Secondary Dressing Applied Bordered Foam Dressing Dry Gauze Foam Kerlix/Conform 6. Footwear/Offloading device applied Other footwear/offloading device applied (specify in notes) 7. Secured with Tape Notes Surveyor, mining) Signed: 01/20/2017 3:20:12 PM By: Regan Lemming BSN, RN Entered By: Regan Lemming on 01/20/2017 09:51:41 [name] ([id number]) -------------------------------------------------------------------------------- Wound Assessment Details Patient Name: [name]. Date of Service: 01/20/2017 9:15 AM Medical Record Number: [id number] Patient Account Number: [id number] Date of Birth/Sex: 1931/06/02 (83 y.o. Female) Treating RN: Afful, RN, BSN, [place] Primary Care Myca Perno: [name] Other Clinician: Referring Dwanna Goshert: [name] Treating Tamaira Ciriello/Extender: Frann Rider in Treatment: 19 Wound Status Wound Number: 2 Primary Etiology: Pressure Ulcer Wound Location: Left Calcaneus - Medial Wound Status: Open Wounding Event: Pressure Injury Comorbid History: Cataracts, Hypertension Date Acquired: 07/02/2016 Weeks Of Treatment: 19 Clustered Wound: No Photos Photo Uploaded By: Regan Lemming on 01/20/2017 15:03:57 Wound Measurements Length: (cm) 2 % Reduction in Width: (cm) 2.5 % Reduction in Depth: (cm) 0.8 Epithelializat Area: (cm) 3.927 Tunneling: Volume: (cm) 3.142 Undermining: Area: 80.2% Volume: -58.8% ion: None No No Wound Description Classification: Category/Stage III Foul Odor Aft Wound Margin: Flat and Intact Due to Produc Exudate Amount: Large Slough/Fibrin Exudate Type: Serosanguineous Exudate Color: red, brown er Cleansing: Yes t Use: No o Yes Wound Bed Granulation Amount: Medium (34-66%) Exposed Structure Granulation Quality: Pink Fat Layer (Subcutaneous  Tissue) Exposed: Yes Necrotic Amount: Medium (34-66%) Necrotic Quality: [address] [place]. ([id number]) Periwound Skin Texture Texture Color No Abnormalities Noted: No No Abnormalities Noted: No Callus: No Atrophie Blanche: No Crepitus: No Cyanosis: No Excoriation: No Ecchymosis: No Induration: No Erythema: No Rash: No Hemosiderin Staining: No Scarring: No Mottled: No Pallor: No Moisture Rubor: No No Abnormalities Noted: No Dry / Scaly: No Temperature / Pain Maceration: Yes Temperature: No Abnormality Tenderness on Palpation: Yes Wound Preparation Ulcer Cleansing: Rinsed/Irrigated with Saline Topical Anesthetic Applied: Other: Lidocaine 4%, Treatment Notes Wound #2 (Left, Medial Calcaneus) 1. Cleansed with: Clean wound with Normal Saline 3. Peri-wound Care: Skin Prep 4. Dressing Applied: Aquacel Ag 5. Secondary Dressing Applied Bordered Foam Dressing Dry Gauze Foam Kerlix/Conform 6. Footwear/Offloading device applied Other footwear/offloading device applied (specify in notes) 7. Secured with Tape Notes Surveyor, mining) Signed: 01/20/2017 3:20:12 PM By: Regan Lemming BSN, RN Entered By: Regan Lemming on 01/20/2017 09:51:55 [name] ([id number]) -------------------------------------------------------------------------------- Wound Assessment Details Patient Name: [name]. Date of Service: 01/20/2017 9:15 AM Medical Record Number: [id number] Patient Account Number: [id number] Date of Birth/Sex:  1931-07-06 (83 y.o. Female) Treating RN: Afful, RN, BSN, [place] Primary Care Jasean Ambrosia: [name] Other Clinician: Referring Clearnce Leja: [name] Treating Sohan Potvin/Extender: Frann Rider in Treatment: 19 Wound Status Wound Number: 7 Primary Etiology: Pressure Ulcer Wound Location: Left Gluteal fold Wound Status: Open Wounding Event: Pressure Injury Comorbid History: Cataracts, Hypertension Date Acquired:  12/24/2016 Weeks Of Treatment: 3 Clustered Wound: No Photos Photo Uploaded By: Regan Lemming on 01/20/2017 15:06:39 Wound Measurements Length: (cm) 0.8 % Reduction in Width: (cm) 0.8 % Reduction in Depth: (cm) 0.2 Epithelializat Area: (cm) 0.503 Tunneling: Volume: (cm) 0.101 Undermining: Area: 82.9% Volume: 82.9% ion: None No No Wound Description Classification: Category/Stage II Foul Odor Aft Wound Margin: Flat and Intact Due to Produc Exudate Amount: Medium Slough/Fibrin Exudate Type: Serous Exudate Color: amber er Cleansing: Yes t Use: No o Yes Wound Bed Granulation Amount: Medium (34-66%) Exposed Structure Granulation Quality: Pink, Pale Fascia Exposed: No Necrotic Amount: Small (1-33%) Fat Layer (Subcutaneous Tissue) Exposed: Yes Necrotic Quality: Adherent Slough Tendon Exposed: No [name], Danielle S. ([id number]) Muscle Exposed: No Joint Exposed: No Bone Exposed: No Periwound Skin Texture Texture Color No Abnormalities Noted: No No Abnormalities Noted: No Callus: No Atrophie Blanche: No Crepitus: No Cyanosis: No Excoriation: No Ecchymosis: No Induration: No Erythema: No Rash: No Hemosiderin Staining: No Scarring: No Mottled: No Pallor: No Moisture Rubor: No No Abnormalities Noted: No Dry / Scaly: No Temperature / Pain Maceration: No Temperature: No Abnormality Tenderness on Palpation: Yes Wound Preparation Ulcer Cleansing: Rinsed/Irrigated with Saline Topical Anesthetic Applied: Other: lidocaine 4%, Treatment Notes Wound #7 (Left Gluteal fold) 1. Cleansed with: Clean wound with Normal Saline 3. Peri-wound Care: Skin Prep 4. Dressing Applied: Aquacel Ag 5. Secondary Dressing Applied Bordered Foam Dressing Dry Gauze Foam Kerlix/Conform 6. Footwear/Offloading device applied Other footwear/offloading device applied (specify in notes) 7. Secured with Tape Notes Surveyor, mining) Signed: 01/20/2017 3:20:12 PM By: Regan Lemming BSN, RN Entered By: Regan Lemming on 01/20/2017 09:52:34 [name], [name] ([name] ([id number]) -------------------------------------------------------------------------------- Vitals Details Patient Name: [name] Date of Service: 01/20/2017 9:15 AM Medical Record Number: [id number] Patient Account Number: [id number] Date of Birth/Sex: Apr 03, 1931 (83 y.o. Female) Treating RN: Afful, RN, BSN, [place] Primary Care Odester Nilson: [name] Other Clinician: Referring Vallarie Fei: [name] Treating Lylith Bebeau/Extender: Frann Rider in Treatment: 19 Vital Signs Time Taken: 09:41 Temperature (F): 97.4 Height (in): 63 Pulse (bpm): 58 Weight (lbs): 160 Respiratory Rate (breaths/min): 16 Body Mass Index (BMI): 28.3 Blood Pressure (mmHg): 97/42 Reference Range: 80 - 120 mg / dl Electronic Signature(s) Signed: 01/20/2017 3:20:12 PM By: Regan Lemming BSN, RN Entered By: Regan Lemming on 01/20/2017 09:42:09

## 2017-01-27 ENCOUNTER — Encounter: Payer: Medicare HMO | Admitting: Surgery

## 2017-01-27 DIAGNOSIS — L89620 Pressure ulcer of left heel, unstageable: Secondary | ICD-10-CM | POA: Diagnosis not present
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
## 2017-01-29 NOTE — Progress Notes (Addendum)
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 will dress this with silver alginate The left heel wound had a lot of maceration today and had to be  sharply debrided with a #3 curet and we will dress this with silver alginate and drawtex. The right heel was prepared in the normal fashion and a sample of Affinity 2.5 x 2.5 skin substitute was applied bolster in place and [name], [name]. ([id number]) protected appropriately for the week Integumentary (Hair, Skin) No suspicious lesions. No crepitus or fluctuance. No peri-wound warmth or erythema. No masses.. Wound #1 status is Open. Original cause of wound was Pressure Injury. The wound is located on the Right,Medial Calcaneus. The wound measures 2cm length x 1.5cm width x 0.2cm depth; 2.356cm^2 area and 0.471cm^3 volume. There is Fat Layer (Subcutaneous Tissue) Exposed exposed. There is no tunneling noted. There is a large amount of  serosanguineous drainage noted. The wound margin is flat and intact. There is medium (34-66%) pink granulation within the wound bed. There is a medium (34-66%) amount of necrotic tissue within the wound bed including Adherent Slough. The periwound skin appearance exhibited: Callus, Maceration. The periwound skin appearance did not exhibit: Crepitus, Excoriation, Induration, Rash, Scarring, Dry/Scaly, Atrophie Blanche, Cyanosis, Ecchymosis, Hemosiderin Staining, Mottled, Pallor, Rubor, Erythema. Periwound temperature was noted as No Abnormality. The periwound has tenderness on palpation. Wound #2 status is Open. Original cause of wound was Pressure Injury. The wound is located on the Left,Medial Calcaneus. The wound measures 2.5cm length x 3.5cm width x 0.2cm depth; 6.872cm^2 area and 1.374cm^3 volume. There is Fat Layer (Subcutaneous Tissue) Exposed exposed. There is no tunneling or undermining noted. There is a large amount of serosanguineous drainage noted. The wound margin is flat and intact. There is medium (34-66%) pink granulation within the wound bed. There is a medium (34- 66%) amount of necrotic tissue within the wound bed including Adherent Slough. The periwound skin appearance exhibited: Callus, Maceration. The periwound skin appearance did not exhibit: Crepitus, Excoriation, Induration, Rash, Scarring, Dry/Scaly, Atrophie Blanche, Cyanosis, Ecchymosis, Hemosiderin Staining, Mottled, Pallor, Rubor, Erythema. Periwound temperature was noted as No Abnormality. The periwound has tenderness on palpation. Wound #7 status is Open. Original cause of wound was Pressure Injury. The wound is located on the Left Gluteal fold. The wound measures 0.4cm length x 0.8cm width x 0.2cm depth; 0.251cm^2 area and 0.05cm^3 volume. There is Fat Layer (Subcutaneous Tissue) Exposed exposed. There is no tunneling or undermining noted. There is a medium amount of serous drainage noted. The wound margin is flat  and intact. There is medium (34-66%) pink, pale granulation within the wound bed. There is a small (1-33%) amount of necrotic tissue within the wound bed including Adherent Slough. The periwound skin appearance did not exhibit: Callus, Crepitus, Excoriation, Induration, Rash, Scarring, Dry/Scaly, Maceration, Atrophie Blanche, Cyanosis, Ecchymosis, Hemosiderin Staining, Mottled, Pallor, Rubor, Erythema. Periwound temperature was noted as No Abnormality. The periwound has tenderness on palpation. 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 ([id number]) Procedures Wound #1 Wound #1 is a Pressure Ulcer located on the Right,Medial Calcaneus . There was a Skin/Subcutaneous Tissue Debridement ([id number]) debridement with total area of 3 sq cm performed by Christin Fudge, MD. with the following instrument(s): Curette to remove Non-Viable tissue/material including Fibrin/Slough and Subcutaneous after achieving pain control using Lidocaine 4% Topical Solution. A time out was conducted at 11:37, prior to the start of the procedure. A Minimum amount  of bleeding was controlled with Pressure. The procedure was tolerated well with a pain level of 0 throughout and a pain level of 0 following the procedure. Post Debridement Measurements: 2cm length x 1.5cm width x 0.2cm depth; 0.471cm^3 volume. Post debridement Stage noted as Category/Stage III. Character of Wound/Ulcer Post Debridement requires further debridement. Severity of Tissue Post Debridement is: Fat layer exposed. Post procedure Diagnosis Wound #1: Same as Pre-Procedure Wound #2 Wound #2 is a Pressure Ulcer located on the Left,Medial Calcaneus . There was a  Skin/Subcutaneous Tissue Debridement ([id number]) debridement with total area of 8.75 sq cm performed by Christin Fudge, MD. with the following instrument(s): Curette to remove Non-Viable tissue/material including Exudate, Fibrin/Slough, and Subcutaneous after achieving pain control using Lidocaine 4% Topical Solution. A time out was conducted at 11:39, prior to the start of the procedure. A Minimum amount of bleeding was controlled with Pressure. The procedure was tolerated well with a pain level of 0 throughout and a pain level of 0 following the procedure. Post Debridement Measurements: 2.5cm length x 3.5cm width x 0.2cm depth; 1.374cm^3 volume. Post debridement Stage noted as Category/Stage III. Character of Wound/Ulcer Post Debridement requires further debridement. Severity of Tissue Post Debridement is: Fat layer exposed. Post procedure Diagnosis Wound #2: Same as Pre-Procedure Wound #2 is a Pressure Ulcer located on the Left,Medial Calcaneus. A skin graft procedure using a bioengineered skin substitute/cellular or tissue based product was performed by Christin Fudge, MD. Other was applied and secured with Steri-Strips. 6.25 sq cm of product was utilized and 0 sq cm was wasted. Post Application, Mepitel and gauze bolster was applied. A Time Out was conducted at 11:45, prior to the start of the procedure. The procedure was tolerated well with a pain level of 0 throughout and a pain level of 0 following the procedure. Post procedure Diagnosis Wound #2: Same as Pre-Procedure General Notes: Donated Affinity 2.5 x 2.5- there is no charge to the patient for this product. Plan [name], [name] ([id number]) Wound Cleansing: Wound #1 Right,Medial Calcaneus: Clean wound with Normal Saline. Wound #2 Left,Medial Calcaneus: Clean wound with Normal Saline. Wound #7 Left Gluteal fold: Clean wound with Normal Saline. Anesthetic: Wound #1 Right,Medial Calcaneus: Topical Lidocaine 4% cream applied to  wound bed prior to debridement - in clinic only Wound #2 Left,Medial Calcaneus: Topical Lidocaine 4% cream applied to wound bed prior to debridement - in clinic only Wound #7 Left Gluteal fold: Topical Lidocaine 4% cream applied to wound bed prior to debridement - in clinic only Skin Barriers/Peri-Wound Care: Wound #1 Right,Medial Calcaneus: Skin Prep Wound #2 Left,Medial Calcaneus: Skin Prep Wound #7 Left Gluteal fold: Skin Prep Primary Wound Dressing: Wound #1 Right,Medial Calcaneus: Aquacel Ag Wound #7 Left Gluteal fold: Aquacel Ag Secondary Dressing: Wound #1 Right,Medial Calcaneus: Dry Gauze - Foam on tops of feet due to reddened areas. Conform/Kerlix Other - heel cups or ABD pads to support offloading Wound #7 Left Gluteal fold: Dry Gauze - Foam on tops of feet due to reddened areas. Conform/Kerlix Other - heel cups or ABD pads to support offloading Wound #2 Left,Medial Calcaneus: Gauze, ABD and Kerlix/Conform Dressing Change Frequency: Wound #1 Right,Medial Calcaneus: Change Dressing Monday, Wednesday, Friday Three times weekly - [facility] Wound #7 Left Gluteal fold: Change Dressing Monday, Wednesday, Friday Three times weekly - [facility]N Wound #2 Left,Medial Calcaneus: Change dressing every week - DO NOT TOUCH! Skin SUBSTITUTE APPLIED IN CLINIC TODAY. Follow-up Appointments: Wound #1 Right,Medial Calcaneus: Return Appointment in 1 week. Wound #2 Left,Medial Calcaneus:  [name], [name] ([id number]) Return Appointment in 1 week. Wound #7 Left Gluteal fold: Return Appointment in 1 week. Edema Control: Wound #1 Right,Medial Calcaneus: Elevate legs to the level of the heart and pump ankles as often as possible Wound #2 Left,Medial Calcaneus: Elevate legs to the level of the heart and pump ankles as often as possible Off-Loading: Wound #1 Right,Medial Calcaneus: Other: - float heels when in bed; keep pressure off during the day. Sage Boots at night. Wound #2 Left,Medial  Calcaneus: Other: - float heels when in bed; keep pressure off during the day. Sage Boots at night. Wound #7 Left Gluteal fold: Turn and reposition every 2 hours Home Health: [place] Visits - [place] Nurse may visit PRN to address patient s wound care needs. FACE TO FACE ENCOUNTER: MEDICARE and MEDICAID PATIENTS: I certify that this patient is under my care and that I had a face-to-face encounter that meets the physician face-to-face encounter requirements with this patient on this date. The encounter with the patient was in whole or in part for the following MEDICAL CONDITION: (primary reason for [place]) MEDICAL NECESSITY: I certify, that based on my findings, NURSING services are a medically necessary home health service. HOME BOUND STATUS: I certify that my clinical findings support that this patient is homebound (i.e., Due to illness or injury, pt requires aid of supportive devices such as crutches, cane, wheelchairs, walkers, the use of special transportation or the assistance of another person to leave their place of residence. There is a normal inability to leave the home and doing so requires considerable and taxing effort. Other absences are for medical reasons / religious services and are infrequent or of short duration when for other reasons). If current dressing causes regression in wound condition, may D/C ordered dressing product/s and apply Normal Saline Moist Dressing daily until next [place] / Other MD appointment. [place] of regression in wound condition at [phone]. Please direct any NON-WOUND related issues/requests for orders to patient's Primary Care Physician Wound #1 Right,Medial Calcaneus: [place] Visits - [place] Nurse may visit PRN to address patient s wound care needs. FACE TO FACE ENCOUNTER: MEDICARE and MEDICAID PATIENTS: I certify that this patient is under my care  and that I had a face-to-face encounter that meets the physician face-to-face encounter requirements with this patient on this date. The encounter with the patient was in whole or in part for the following MEDICAL CONDITION: (primary reason for [place]) MEDICAL NECESSITY: I certify, that based on my findings, NURSING services are a medically necessary home health service. HOME BOUND STATUS: I certify that my clinical findings support that this patient is homebound (i.e., Due to illness or injury, pt requires aid of supportive devices such as crutches, cane, wheelchairs, walkers, the use of special transportation or the assistance of another person to leave their place of residence. There is a normal inability to leave the home and doing so requires considerable and taxing effort. Other absences are for medical reasons / religious services and are infrequent or of short duration when for other reasons). If current dressing causes regression in wound condition, may D/C ordered dressing product/s and apply Normal Saline Moist Dressing daily until next [place] / Other MD appointment. [place] of regression in wound condition at [phone]. Please direct any NON-WOUND related issues/requests for orders to patient's Primary Care Physician Wound #7 Left Gluteal fold: [place] Visits -  [address] [name], [name] ([id number]) Home Health Nurse may visit PRN to address patient s wound care needs. FACE TO FACE ENCOUNTER: MEDICARE and MEDICAID PATIENTS: I certify that this patient is under my care and that I had a face-to-face encounter that meets the physician face-to-face encounter requirements with this patient on this date. The encounter with the patient was in whole or in part for the following MEDICAL CONDITION: (primary reason for [place]) MEDICAL NECESSITY: I certify, that based on my findings, NURSING services are a medically necessary home  health service. HOME BOUND STATUS: I certify that my clinical findings support that this patient is homebound (i.e., Due to illness or injury, pt requires aid of supportive devices such as crutches, cane, wheelchairs, walkers, the use of special transportation or the assistance of another person to leave their place of residence. There is a normal inability to leave the home and doing so requires considerable and taxing effort. Other absences are for medical reasons / religious services and are infrequent or of short duration when for other reasons). If current dressing causes regression in wound condition, may D/C ordered dressing product/s and apply Normal Saline Moist Dressing daily until next [place] / Other MD appointment. [place] of regression in wound condition at [phone]. Please direct any NON-WOUND related issues/requests for orders to patient's Primary Care Physician General Notes: Affinity 2.5 x 2.5 applied in clinic today. Secured with Mepitel, gauze bolster, steristrips. I have recommended: 1. Silver alginate to the left heels, heel cups and offloading with Sage boots. 2. the skin substitute Affinity has been applied to the right heel and will not be disturbed for the week 3. Adequate proteins, vitamin A, vitamin C and zinc. 4. Silver alginate and a bordered foam to the left gluteal region. 5. Offloading has been stressed with her again in great detail especially about change in position while sitting and sleeping Electronic Signature(s) Signed: 01/27/2017 2:20:26 PM By: Christin Fudge MD, FACS Previous Signature: 01/27/2017 12:18:10 PM Version By: Christin Fudge MD, FACS Entered By: Christin Fudge on 01/27/2017 14:20:26 [name] ([id number]) -------------------------------------------------------------------------------- SuperBill Details Patient Name: [name]. Date of Service: 01/27/2017 Medical Record Number: [id number] Patient  Account Number: [id number] Date of Birth/Sex: 28-Mar-1931 (83 y.o. Female) Treating RN: Afful, RN, BSN, [place] Primary Care Provider: [name] Other Clinician: Referring Provider: [name] Treating Provider/Extender: Frann Rider in Treatment: 20 Diagnosis Coding ICD-10 Codes Code Description L89.620 Pressure ulcer of left heel, unstageable L89.610 Pressure ulcer of right heel, unstageable L97.512 Non-pressure chronic ulcer of other part of right foot with fat layer exposed Z99.3 Dependence on wheelchair L89.322 Pressure ulcer of left buttock, stage 2 Facility Procedures CPT4 Code Description: [id number] 11042 - DEB SUBQ TISSUE 20 SQ CM/< ICD-10 Description Diagnosis L89.620 Pressure ulcer of left heel, unstageable L97.512 Non-pressure chronic ulcer of other part of right fo L89.322 Pressure ulcer of left buttock, stage 2 Modifier: ot with fat la Quantity: 1 yer exposed Physician Procedures CPT4 Code Description: [id number] 11042 - WC PHYS SUBQ TISS 20 SQ CM ICD-10 Description Diagnosis L89.620 Pressure ulcer of left heel, unstageable L97.512 Non-pressure chronic ulcer of other part of right foot L89.322 Pressure ulcer of left buttock, stage 2 Modifier: with fat lay Quantity: 1 er exposed CPT4 Code Description: [id number] 15275 - WC PHYS SKIN SUB GRAFT FACE/NK/HF/G ICD-10 Description Diagnosis L89.610 Pressure ulcer of right heel, unstageable Modifier: Quantity: 1 Notes Donated Affinity 2.5 x 2.5- there is no charge to the  patient for this product. [name], [name] ([id number]) Electronic Signature(s) Signed: 03/09/2017 5:00:31 PM By: Gretta Cool, BSN, RN, CWS, Kim RN, BSN Previous Signature: 03/09/2017 4:22:29 PM Version By: Gretta Cool BSN, RN, CWS, Kim RN, BSN Previous Signature: 01/27/2017 2:17:24 PM Version By: Christin Fudge MD, FACS Previous Signature: 01/27/2017 2:42:46 PM Version By: Gretta Cool RN, BSN, Kim RN, BSN Previous Signature: 01/27/2017 12:45:06 PM Version By: Christin Fudge  MD, FACS Entered By: Gretta Cool, BSN, RN, CWS, Kim on 03/09/2017 17:00:31

## 2017-01-31 ENCOUNTER — Ambulatory Visit: Payer: Medicare HMO

## 2017-02-02 ENCOUNTER — Ambulatory Visit (INDEPENDENT_AMBULATORY_CARE_PROVIDER_SITE_OTHER): Payer: Medicare HMO

## 2017-02-02 VITALS — BP 130/73 | HR 57 | Ht 63.0 in

## 2017-02-02 DIAGNOSIS — R339 Retention of urine, unspecified: Secondary | ICD-10-CM | POA: Diagnosis not present

## 2017-02-02 NOTE — Progress Notes (Signed)
Suprapubic Cath Change  Patient is present today for a suprapubic catheter change due to urinary retention.  9 ml of water was drained from the balloon, a 16 FR foley cath was removed from the tract with out difficulty.  Site was cleaned and prepped in a sterile fashion with betadine.  A 16 FR foley cath was replaced into the tract no complications were noted. Urine return was noted, 10 ml of sterile water was inflated into the balloon and a leg bag was attached for drainage.  Patient tolerated well. A night bag was given to patient and proper instruction was given on how to switch bags.    Preformed by: K.Russell,CMA

## 2017-02-03 ENCOUNTER — Encounter: Payer: Medicare HMO | Admitting: Surgery

## 2017-02-03 DIAGNOSIS — Z993 Dependence on wheelchair: Secondary | ICD-10-CM | POA: Diagnosis not present

## 2017-02-03 DIAGNOSIS — L97512 Non-pressure chronic ulcer of other part of right foot with fat layer exposed: Secondary | ICD-10-CM | POA: Diagnosis not present

## 2017-02-03 DIAGNOSIS — L89322 Pressure ulcer of left buttock, stage 2: Secondary | ICD-10-CM | POA: Diagnosis not present

## 2017-02-03 DIAGNOSIS — N189 Chronic kidney disease, unspecified: Secondary | ICD-10-CM | POA: Diagnosis not present

## 2017-02-03 DIAGNOSIS — L89610 Pressure ulcer of right heel, unstageable: Secondary | ICD-10-CM | POA: Diagnosis not present

## 2017-02-03 DIAGNOSIS — D649 Anemia, unspecified: Secondary | ICD-10-CM | POA: Diagnosis not present

## 2017-02-03 DIAGNOSIS — I129 Hypertensive chronic kidney disease with stage 1 through stage 4 chronic kidney disease, or unspecified chronic kidney disease: Secondary | ICD-10-CM | POA: Diagnosis not present

## 2017-02-03 DIAGNOSIS — L89620 Pressure ulcer of left heel, unstageable: Secondary | ICD-10-CM | POA: Diagnosis not present

## 2017-02-04 ENCOUNTER — Telehealth: Payer: Self-pay

## 2017-02-04 NOTE — Telephone Encounter (Signed)
Spoke with pt in reference to SPT draining properly. Pt stated that she has not emptied her SPT leg bag since Thursday. Pt denied any pain, n/v, f/c, discomfort of any kind. Made pt aware she needs to be seen. Offered pt an appt this afternoon. Pt declined stating she did not have a ride. Offered pt to come in on Monday and pt declined stating again she did not have a ride and could not get one that soon. Reinforced with pt she will need to seek tx at the ER if her s/s get worse. Pt voiced understanding of whole conversation.

## 2017-02-04 NOTE — Telephone Encounter (Signed)
The pt called complaining that urine was going in her diaper. The pt came in on Wednesday for Suprapubic change and the catheter was placed w/ no issues. She states since the catheter change she have not had to empty the bag, because no urine have been in her bag. I tried to get the pt to come in today and she informed me that she doesn't have transportation to get here. I stress to the pt the importants of coming in because she could go into urinary retention, but she said she's not in pain and just got to arrange transportation first.

## 2017-02-04 NOTE — Telephone Encounter (Signed)
Pt called stating she is taking her oxybutynin but continues to have urine leakage into her depends. Pt requested another medication. Please advise.

## 2017-02-04 NOTE — Telephone Encounter (Signed)
First, make sure her suprapubic tube is draining appropriately. She may have samples of Mybetriq 25 mg to try.  Hollice Espy, MD

## 2017-02-05 NOTE — Progress Notes (Addendum)
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 looked in a long while. 12/10/2016 -- we  had run her insurance regarding skin substitute and one of them was a copayment of $295 and we are awaiting a callback from the other vendors. 12/24/2016 -- she has a new ulceration on the left buttock which has come in during the last week. DEMETRIUS, BARRELL ([id number]) 01/27/2017 -- she had the first application of Affinity 2.5 x 2.5 cm applied to her right heel. This was a Scientist, research (medical) supplied sample product 02/03/2017 -- she had the second application of Affinity 2.5 x 2.5 cm applied to her right heel. This was a Scientist, research (medical) supplied sample product she had the first application of Nushield 2x3 cm applied to her leftt heel. This was a Scientist, research (medical) supplied  sample product Objective Constitutional Pulse regular. Respirations normal and unlabored. Afebrile. Vitals Time Taken: 2:35 PM, Height: 63 in, Weight: 160 lbs, BMI: 28.3, Temperature: 98.3 F, Pulse: 65 bpm, Respiratory Rate: 16 breaths/min, Blood Pressure: 123/65 mmHg. Eyes Nonicteric. Reactive to light. Ears, Nose, Mouth, and Throat Lips, teeth, and gums WNL.Marland Kitchen Moist mucosa without lesions. Neck supple and nontender. No palpable supraclavicular or cervical adenopathy. Normal sized without goiter. Respiratory WNL. No retractions.. Breath sounds WNL, No rubs, rales, rhonchi, or wheeze.. Cardiovascular Heart rhythm and rate regular, no murmur or gallop.. Pedal Pulses WNL. No clubbing, cyanosis or edema. Chest Breasts symmetical and no nipple discharge.. Breast tissue WNL, no masses, lumps, or tenderness.. Lymphatic No adneopathy. No adenopathy. No adenopathy. Musculoskeletal Adexa without tenderness or enlargement.. Digits and nails w/o clubbing, cyanosis, infection, petechiae, ischemia, or inflammatory conditions.Marland Kitchen Psychiatric Judgement and insight Intact.. No evidence of depression, anxiety, or agitation.Marland Kitchen JENIFER, STRUVE ([id number]) General Notes: The gluteal wound is looking very good and almost completely healed. We will continue with silver alginate there. The skin substitutes were used to the left and right heel after appropriate sharp debridement was done and bleeding controlled with pressure. Integumentary (Hair, Skin) No suspicious lesions. No crepitus or fluctuance. No peri-wound warmth or erythema. No masses.. Wound #1 status is Open. Original cause of wound was Pressure Injury. The wound is located on the Right,Medial Calcaneus. The wound measures 1.5cm length x 2cm width x 0.2cm depth; 2.356cm^2 area and 0.471cm^3 volume. There is Fat Layer (Subcutaneous Tissue) Exposed exposed. There is no tunneling or undermining noted. There is a large amount of serosanguineous  drainage noted. The wound margin is flat and intact. There is medium (34-66%) pink granulation within the wound bed. There is a small (1-33%) amount of necrotic tissue within the wound bed including Adherent Slough. The periwound skin appearance exhibited: Callus, Excoriation, Maceration. The periwound skin appearance did not exhibit: Crepitus, Induration, Rash, Scarring, Dry/Scaly, Atrophie Blanche, Cyanosis, Ecchymosis, Hemosiderin Staining, Mottled, Pallor, Rubor, Erythema. Periwound temperature was noted as No Abnormality. The periwound has tenderness on palpation. Wound #2 status is Open. Original cause of wound was Pressure Injury. The wound is located on the Left,Medial Calcaneus. The wound measures 2.5cm length x 3cm width x 0.2cm depth; 5.89cm^2 area and 1.178cm^3 volume. There is Fat Layer (Subcutaneous Tissue) Exposed exposed. There is no tunneling or undermining noted. There is a large amount of serosanguineous drainage noted. The wound margin is flat and intact. There is medium (34-66%) pink granulation within the wound bed. There is a medium (34-66%) amount of necrotic tissue within the wound bed including Adherent Slough. The periwound skin appearance exhibited: Callus, Maceration. The periwound skin appearance did not exhibit: Crepitus, Excoriation, Induration, Rash, Scarring, Dry/Scaly, Atrophie Blanche, Cyanosis, Ecchymosis, Hemosiderin Staining, Mottled, Pallor, Rubor, Erythema. Periwound temperature was noted  as No Abnormality. The periwound has tenderness on palpation. Wound #7 status is Open. Original cause of wound was Pressure Injury. The wound is located on the Left Gluteal fold. The wound measures 0.1cm length x 0.1cm width x 0.1cm depth; 0.008cm^2 area and 0.001cm^3 volume. There is Fat Layer (Subcutaneous Tissue) Exposed exposed. There is no tunneling or undermining noted. There is a none present amount of drainage noted. The wound margin is flat and intact. There is  no granulation within the wound bed. There is a small (1-33%) amount of necrotic tissue within the wound bed including Adherent Slough. The periwound skin appearance exhibited: Dry/Scaly. The periwound skin appearance did not exhibit: Callus, Crepitus, Excoriation, Induration, Rash, Scarring, Maceration, Atrophie Blanche, Cyanosis, Ecchymosis, Hemosiderin Staining, Mottled, Pallor, Rubor, Erythema. Periwound temperature was noted as No Abnormality. Assessment Active Problems ICD-10 L89.620 - Pressure ulcer of left heel, unstageable L89.610 - Pressure ulcer of right heel, unstageable L97.512 - Non-pressure chronic ulcer of other part of right foot with fat layer exposed Exantus, Christyanna S. ([id number]) Z99.3 - Dependence on wheelchair [phone] - Pressure ulcer of left buttock, stage 2 Procedures Wound #1 Wound #1 is a Pressure Ulcer located on the Right,Medial Calcaneus . There was a Skin/Subcutaneous Tissue Debridement ([id number]) debridement with total area of 3 sq cm performed by Christin Fudge, MD. with the following instrument(s): Curette to remove Non-Viable tissue/material including Exudate, Fibrin/Slough, and Subcutaneous after achieving pain control using Lidocaine 4% Topical Solution. A time out was conducted at 15:00, prior to the start of the procedure. A Minimum amount of bleeding was controlled with Pressure. The procedure was tolerated well with a pain level of 0 throughout and a pain level of 0 following the procedure. Post Debridement Measurements: 1.5cm length x 2cm width x 0.2cm depth; 0.471cm^3 volume. Post debridement Stage noted as Category/Stage III. Character of Wound/Ulcer Post Debridement is stable. Severity of Tissue Post Debridement is: Fat layer exposed. Post procedure Diagnosis Wound #1: Same as Pre-Procedure General Notes: after sharp debridement a sample of Affinity 2 x 5 x 2 x 5 cm was used.. Wound #1 is a Pressure Ulcer located on the Right,Medial Calcaneus. A  skin graft procedure using a bioengineered skin substitute/cellular or tissue based product was performed by Christin Fudge, MD. Other was applied and secured with Steri-Strips. 6.25 sq cm of product was utilized and 0 sq cm was wasted. Post Application, mepitel 1 was applied. A Time Out was conducted at 15:05, prior to the start of the procedure. The procedure was tolerated well with a pain level of 0 throughout and a pain level of 0 following the procedure. Post procedure Diagnosis Wound #1: Same as Pre-Procedure General Notes: Affinity 2.5x2.5 Applied . Donated. No charge. Wound #2 Wound #2 is a Pressure Ulcer located on the Left,Medial Calcaneus . There was a Skin/Subcutaneous Tissue Debridement ([id number]) debridement with total area of 7.5 sq cm performed by Christin Fudge, MD. with the following instrument(s): Curette to remove Non-Viable tissue/material including Exudate, Fibrin/Slough, and Subcutaneous after achieving pain control using Lidocaine 4% Topical Solution. A time out was conducted at 14:55, prior to the start of the procedure. A Minimum amount of bleeding was controlled with Pressure. The procedure was tolerated well with a pain level of 0 throughout and a pain level of 0 following the procedure. Post Debridement Measurements: 2.5cm length x 3cm width x 0.2cm depth; 1.178cm^3 volume. Post debridement Stage noted as Category/Stage III. Character of Wound/Ulcer Post Debridement is stable. Severity of Tissue Post Debridement is:  Fat layer exposed. Post procedure Diagnosis Wound #2: Same as Pre-Procedure General Notes: after sharp debridement a sample of nushield 2 x 3 cm was used on the left side. Wound #2 is a Pressure Ulcer located on the Left,Medial Calcaneus. A skin graft procedure using a bioengineered skin substitute/cellular or tissue based product was performed by Christin Fudge, MD. Other was applied and secured with Steri-Strips. 6 sq cm of product was utilized and 0  sq cm was wasted. [address] LUISANA, LUTZKE ([id number]) Application, mepitel 1 was applied. A Time Out was conducted at 15:05, prior to the start of the procedure. The procedure was tolerated well with a pain level of 0 throughout and a pain level of 0 following the procedure. Post procedure Diagnosis Wound #2: Same as Pre-Procedure General Notes: 2x3 Nushield donated. No Charge for this product. Plan Wound Cleansing: Wound #1 Right,Medial Calcaneus: Clean wound with Normal Saline. Wound #2 Left,Medial Calcaneus: Clean wound with Normal Saline. Wound #7 Left Gluteal fold: Clean wound with Normal Saline. Anesthetic: Wound #1 Right,Medial Calcaneus: Topical Lidocaine 4% cream applied to wound bed prior to debridement - in clinic only Wound #2 Left,Medial Calcaneus: Topical Lidocaine 4% cream applied to wound bed prior to debridement - in clinic only Wound #7 Left Gluteal fold: Topical Lidocaine 4% cream applied to wound bed prior to debridement - in clinic only Skin Barriers/Peri-Wound Care: Wound #1 Right,Medial Calcaneus: Skin Prep Wound #2 Left,Medial Calcaneus: Skin Prep Wound #7 Left Gluteal fold: Skin Prep Primary Wound Dressing: Wound #1 Right,Medial Calcaneus: Other: - NUSHIELD Other: - AFFINITY Wound #7 Left Gluteal fold: Aquacel Ag Secondary Dressing: Wound #1 Right,Medial Calcaneus: Dry Gauze - Foam on tops of feet due to reddened areas. Conform/Kerlix Other - heel cups or ABD pads to support offloading Wound #2 Left,Medial Calcaneus: Gauze, ABD and Kerlix/Conform Drawtex Wound #7 Left Gluteal fold: Dry Gauze - Foam on tops of feet due to reddened areas. Conform/Kerlix JULIENNE, VOGLER ([id number]) Drawtex Other - heel cups or ABD pads to support offloading Dressing Change Frequency: Wound #1 Right,Medial Calcaneus: Change dressing every week - TO BE CHANGED AT THE [place] Wound #2 Left,Medial Calcaneus: Change dressing every week - DO NOT TOUCH! Skin SUBSTITUTE  APPLIED IN CLINIC TODAY. TO BE CHANGED AT THE [facility] Wound #7 Left Gluteal fold: Change Dressing Monday, Wednesday, Friday Three times weekly - [facility] Follow-up Appointments: Wound #1 Right,Medial Calcaneus: Return Appointment in 1 week. Wound #2 Left,Medial Calcaneus: Return Appointment in 1 week. Wound #7 Left Gluteal fold: Return Appointment in 1 week. Edema Control: Wound #1 Right,Medial Calcaneus: Elevate legs to the level of the heart and pump ankles as often as possible Wound #2 Left,Medial Calcaneus: Elevate legs to the level of the heart and pump ankles as often as possible Off-Loading: Wound #1 Right,Medial Calcaneus: Other: - float heels when in bed; keep pressure off during the day. [employer] at night. Wound #2 Left,Medial Calcaneus: Other: - float heels when in bed; keep pressure off during the day. [employer] at night. Wound #7 Left Gluteal fold: Turn and reposition every 2 hours Home Health: [place] Visits - [place] Nurse may visit PRN to address patient s wound care needs. FACE TO FACE ENCOUNTER: MEDICARE and MEDICAID PATIENTS: I certify that this patient is under my care and that I had a face-to-face encounter that meets the physician face-to-face encounter requirements with this patient on this date. The encounter with the patient was in whole or in part for the  following MEDICAL CONDITION: (primary reason for Home Healthcare) MEDICAL NECESSITY: I certify, that based on my findings, NURSING services are a medically necessary home health service. HOME BOUND STATUS: I certify that my clinical findings support that this patient is homebound (i.e., Due to illness or injury, pt requires aid of supportive devices such as crutches, cane, wheelchairs, walkers, the use of special transportation or the assistance of another person to leave their place of residence. There is a normal inability to leave the home and doing so requires considerable and  taxing effort. Other absences are for medical reasons / religious services and are infrequent or of short duration when for other reasons). If current dressing causes regression in wound condition, may D/C ordered dressing product/s and apply Normal Saline Moist Dressing daily until next [place] / Other MD appointment. [place] of regression in wound condition at [phone]. Please direct any NON-WOUND related issues/requests for orders to patient's Primary Care Physician Wound #1 Right,Medial Calcaneus: [place] Visits - [place] Nurse may visit PRN to address patient s wound care needs. FACE TO FACE ENCOUNTER: MEDICARE and MEDICAID PATIENTS: I certify that this patient is under ZELIA, YZAGUIRRE ([id number]) my care and that I had a face-to-face encounter that meets the physician face-to-face encounter requirements with this patient on this date. The encounter with the patient was in whole or in part for the following MEDICAL CONDITION: (primary reason for [place]) MEDICAL NECESSITY: I certify, that based on my findings, NURSING services are a medically necessary home health service. HOME BOUND STATUS: I certify that my clinical findings support that this patient is homebound (i.e., Due to illness or injury, pt requires aid of supportive devices such as crutches, cane, wheelchairs, walkers, the use of special transportation or the assistance of another person to leave their place of residence. There is a normal inability to leave the home and doing so requires considerable and taxing effort. Other absences are for medical reasons / religious services and are infrequent or of short duration when for other reasons). If current dressing causes regression in wound condition, may D/C ordered dressing product/s and apply Normal Saline Moist Dressing daily until next [place] / Other MD appointment. [place] of regression in wound condition at [phone]. Please direct any NON-WOUND related issues/requests for orders to patient's Primary Care Physician Wound #7 Left Gluteal fold: [place] Visits - [place] Nurse may visit PRN to address patient s wound care needs. FACE TO FACE ENCOUNTER: MEDICARE and MEDICAID PATIENTS: I certify that this patient is under my care and that I had a face-to-face encounter that meets the physician face-to-face encounter requirements with this patient on this date. The encounter with the patient was in whole or in part for the following MEDICAL CONDITION: (primary reason for [place]) MEDICAL NECESSITY: I certify, that based on my findings, NURSING services are a medically necessary home health service. HOME BOUND STATUS: I certify that my clinical findings support that this patient is homebound (i.e., Due to illness or injury, pt requires aid of supportive devices such as crutches, cane, wheelchairs, walkers, the use of special transportation or the assistance of another person to leave their place of residence. There is a normal inability to leave the home and doing so requires considerable and taxing effort. Other absences are for medical reasons / religious services and are infrequent or of short duration when for other reasons). If current  dressing causes regression in wound condition, may D/C ordered dressing product/s and apply Normal Saline Moist Dressing daily until next [place] / Other MD appointment. [place] of regression in wound condition at [phone]. Please direct any NON-WOUND related issues/requests for orders to patient's Primary Care Physician I have recommended: 1. the skin substitute Nushield has been applied to the left heel and will be left undisturbed for the week 2. the skin substitute Affinity has been applied to the right heel and will not be disturbed for the week 3.  Adequate proteins, vitamin A, vitamin C and zinc. 4. Silver alginate and a bordered foam to the left gluteal region. 5. Offloading has been stressed with her again in great detail especially about change in position while sitting and sleeping Electronic Signature(s) Signed: 02/16/2017 11:27:18 AM By: Gretta Cool RN, BSN, Kim RN, BSN Signed: 02/17/2017 4:01:18 PM By: Christin Fudge MD, FACS Previous Signature: 02/10/2017 10:45:51 AM Version By: Christin Fudge MD, FACS LOIS, SLAGEL ([id number]) Previous Signature: 02/03/2017 3:28:11 PM Version By: Christin Fudge MD, FACS Entered By: Gretta Cool RN, BSN, Kim on 02/16/2017 11:27:18 [name] ([id number]) -------------------------------------------------------------------------------- SuperBill Details Patient Name: MAYLYN, NARVAIZ. Date of Service: 02/03/2017 Medical Record Number: [id number] Patient Account Number: [id number] Date of Birth/Sex: 29-Mar-1931 (83 y.o. Female) Treating RN: Afful, RN, BSN, [place] Primary Care Provider: [name] Other Clinician: Referring Provider: [name] Treating Provider/Extender: Frann Rider in Treatment: 21 Diagnosis Coding ICD-10 Codes Code Description L89.620 Pressure ulcer of left heel, unstageable L89.610 Pressure ulcer of right heel, unstageable L97.512 Non-pressure chronic ulcer of other part of right foot with fat layer exposed Z99.3 Dependence on wheelchair L89.322 Pressure ulcer of left buttock, stage 2 Facility Procedures CPT4 Code Description: [id number] 11042 - DEB SUBQ TISSUE 20 SQ CM/< ICD-10 Description Diagnosis L89.620 Pressure ulcer of left heel, unstageable L89.610 Pressure ulcer of right heel, unstageable 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 L89.322 Pressure ulcer of left buttock, stage 2 Modifier: ot with fat la Quantity: 1 yer exposed Physician Procedures CPT4 Code Description: [id number] 11042 - WC PHYS SUBQ TISS 20 SQ CM ICD-10 Description Diagnosis  L89.620 Pressure ulcer of left heel, unstageable L89.610 Pressure ulcer of right heel, unstageable 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o L89.322 Pressure ulcer of left buttock, stage 2 Modifier: t with fat lay Quantity: 1 er exposed Electronic Signature(s) Signed: 02/03/2017 3:28:26 PM By: Christin Fudge MD, FACS Entered By: Christin Fudge on 02/03/2017 15:28:25

## 2017-02-05 NOTE — Progress Notes (Signed)
EMILLIE, CHASEN ([id number]) Visit Report for 02/03/2017 Arrival Information Details Patient Name: [name], [name]. Date of Service: 02/03/2017 2:00 PM Medical Record Number: [id number] Patient Account Number: [id number] Date of Birth/Sex: 1931/01/21 (83 y.o. Female) Treating RN: Afful, RN, BSN, Velva Harman Primary Care Kasie Leccese: [name] Other Clinician: Referring Jaycie Kregel: [name] Treating Kimerly Rowand/Extender: Frann Rider in Treatment: 21 Visit Information History Since Last Visit All ordered tests and consults were completed: No Patient Arrived: Wheel Chair Added or deleted any medications: No Arrival Time: 14:35 Any new allergies or adverse reactions: No Accompanied By: self Had a fall or experienced change in No activities of daily living that may affect Transfer Assistance: Harrel Lemon Lift risk of falls: Patient Identification Verified: Yes Signs or symptoms of abuse/neglect since last No Secondary Verification Process Yes visito Completed: Hospitalized since last visit: No Patient Requires Transmission-Based No Has Dressing in Place as Prescribed: Yes Precautions: Pain Present Now: No Patient Has Alerts: No Electronic Signature(s) Signed: 02/03/2017 6:06:48 PM By: Regan Lemming BSN, RN Entered By: Regan Lemming on 02/03/2017 15:18:08 [name] ([id number]) -------------------------------------------------------------------------------- Encounter Discharge Information Details Patient Name: [name]. Date of Service: 02/03/2017 2:00 PM Medical Record Number: [id number] Patient Account Number: [id number] Date of Birth/Sex: [birth year] (83 y.o. Female) Treating RN: Afful, RN, BSN, Velva Harman Primary Care Santasia Rew: [name] Other Clinician: Referring Vangie Henthorn: [name] Treating Zori Benbrook/Extender: Frann Rider in Treatment: 21 Encounter Discharge Information Items Discharge Pain Level: 0 Discharge Condition: Stable Ambulatory Status:  Wheelchair Discharge Destination: Home Transportation: Other Accompanied By: self Schedule Follow-up Appointment: No Medication Reconciliation completed and provided to Patient/Care No Tyonna Talerico: Provided on Clinical Summary of Care: 02/03/2017 Form Type Recipient Paper Patient EB Electronic Signature(s) Signed: 02/03/2017 5:57:42 PM By: Regan Lemming BSN, RN Previous Signature: 02/03/2017 3:38:36 PM Version By: Ruthine Dose Entered By: Regan Lemming on 02/03/2017 17:57:42 [name], [name] ([id number]) -------------------------------------------------------------------------------- Lower Extremity Assessment Details Patient Name: [name]. Date of Service: 02/03/2017 2:00 PM Medical Record Number: [id number] Patient Account Number: [id number] Date of Birth/Sex: 1931-02-21 (83 y.o. Female) Treating RN: Afful, RN, BSN, Velva Harman Primary Care Ermelinda Eckert: [name] Other Clinician: Referring Nikholas Geffre: [name] Treating Larua Collier/Extender: Frann Rider in Treatment: 21 Edema Assessment Assessed: [Left: No] [Right: No] Edema: [Left: No] [Right: No] Vascular Assessment Pulses: Dorsalis Pedis Palpable: [Left:Yes] [Right:Yes] Posterior Tibial Extremity colors, hair growth, and conditions: Extremity Color: [Left:Hyperpigmented] [Right:Normal] Hair Growth on Extremity: [Left:No] [Right:No] Temperature of Extremity: [Left:Warm] [Right:Warm] Capillary Refill: [Left:< 3 seconds] [Right:< 3 seconds] Toe Nail Assessment Left: Right: Thick: Yes Yes Discolored: Yes Yes Deformed: Yes Yes Improper Length and Hygiene: Yes Yes Electronic Signature(s) Signed: 02/03/2017 6:06:48 PM By: Regan Lemming BSN, RN Entered By: Regan Lemming on 02/03/2017 14:51:25 [name] ([id number]) -------------------------------------------------------------------------------- Multi Wound Chart Details Patient Name: [name]. Date of Service: 02/03/2017 2:00 PM Medical Record Number:  [id number] Patient Account Number: [id number] Date of Birth/Sex: 01-08-1931 (83 y.o. Female) Treating RN: Baruch Gouty, RN, BSN, Velva Harman Primary Care Charlsie Fleeger: [name] Other Clinician: Referring Khrystian Schauf: [name] Treating Adrien Shankar/Extender: Frann Rider in Treatment: 21 Vital Signs Height(in): 63 Pulse(bpm): 65 Weight(lbs): 160 Blood Pressure 123/65 (mmHg): Body Mass Index(BMI): 28 Temperature(F): 98.3 Respiratory Rate 16 (breaths/min): Photos: [1:No Photos] [2:No Photos] [7:No Photos] Wound Location: [1:Right Calcaneus - Medial] [2:Left Calcaneus - Medial] [7:Left Gluteal fold] Wounding Event: [1:Pressure Injury] [2:Pressure Injury] [7:Pressure Injury] Primary Etiology: [1:Pressure Ulcer] [2:Pressure Ulcer] [7:Pressure Ulcer] Comorbid History: [1:Cataracts, Hypertension] [2:Cataracts, Hypertension] [7:Cataracts, Hypertension] Date Acquired: [1:07/02/2016] [2:07/02/2016] [7:12/24/2016] Weeks  of Treatment: [1:21] [2:21] [7:5] Wound Status: [1:Open] [2:Open] [7:Open] Measurements L x W x D 1.5x2x0.2 [2:2.5x3x0.2] [7:0.1x0.1x0.1] (cm) Area (cm) : [1:2.356] [2:5.89] [7:0.008] Volume (cm) : [1:0.471] [2:1.178] [7:0.001] % Reduction in Area: [1:81.00%] [2:70.20%] [7:99.70%] % Reduction in Volume: 61.90% [2:40.50%] [7:99.80%] Classification: [1:Category/Stage III] [2:Category/Stage III] [7:Category/Stage II] Exudate Amount: [1:Large] [2:Large] [7:None Present] Exudate Type: [1:Serosanguineous] [2:Serosanguineous] [7:N/A] Exudate Color: [1:red, brown] [2:red, brown] [7:N/A] Foul Odor After [1:Yes] [2:No] [7:No] Cleansing: Odor Anticipated Due to No [2:N/A] [7:N/A] Product Use: Wound Margin: [1:Flat and Intact] [2:Flat and Intact] [7:Flat and Intact] Granulation Amount: [1:Medium (34-66%)] [2:Medium (34-66%)] [7:None Present (0%)] Granulation Quality: [1:Pink] [2:Pink] [7:N/A] Necrotic Amount: [1:Small (1-33%)] [2:Medium (34-66%)] [7:Small (1-33%)] Exposed  Structures: [1:Fat Layer (Subcutaneous Tissue) Exposed: Yes] [2:Fat Layer (Subcutaneous Tissue) Exposed: Yes] [7:Fat Layer (Subcutaneous Tissue) Exposed: Yes Fascia: No Tendon: No] Muscle: No Joint: No Bone: No Epithelialization: Small (1-33%) None Large (67-100%) Debridement: Debridement (31540- Debridement (11042- N/A 11047) 11047) Pre-procedure 15:00 14:55 N/A Verification/Time Out Taken: Pain Control: Lidocaine 4% Topical Lidocaine 4% Topical N/A Solution Solution Tissue Debrided: Fibrin/Slough, Exudates, Fibrin/Slough, Exudates, N/A Subcutaneous Subcutaneous Level: Skin/Subcutaneous Skin/Subcutaneous N/A Tissue Tissue Debridement Area (sq 3 7.5 N/A cm): Instrument: Curette Curette N/A Bleeding: Minimum Minimum N/A Hemostasis Achieved: Pressure Pressure N/A Procedural Pain: 0 0 N/A Post Procedural Pain: 0 0 N/A Debridement Treatment Procedure was tolerated Procedure was tolerated N/A Response: well well Post Debridement 1.5x2x0.2 2.5x3x0.2 N/A Measurements L x W x D (cm) Post Debridement 0.471 1.178 N/A Volume: (cm) Post Debridement Category/Stage III Category/Stage III N/A Stage: Periwound Skin Texture: Excoriation: Yes Callus: Yes Excoriation: No Callus: Yes Excoriation: No Induration: No Induration: No Induration: No Callus: No Crepitus: No Crepitus: No Crepitus: No Rash: No Rash: No Rash: No Scarring: No Scarring: No Scarring: No Periwound Skin Maceration: Yes Maceration: Yes Dry/Scaly: Yes Moisture: Dry/Scaly: No Dry/Scaly: No Maceration: No Periwound Skin Color: Atrophie Blanche: No Atrophie Blanche: No Atrophie Blanche: No Cyanosis: No Cyanosis: No Cyanosis: No Ecchymosis: No Ecchymosis: No Ecchymosis: No Erythema: No Erythema: No Erythema: No Hemosiderin Staining: No Hemosiderin Staining: No Hemosiderin Staining: No Mottled: No Mottled: No Mottled: No Pallor: No Pallor: No Pallor: No Rubor: No Rubor: No Rubor:  No Temperature: No Abnormality No Abnormality No Abnormality Tenderness on Yes Yes No Palpation: [name], [name] ([id number]) Wound Preparation: Ulcer Cleansing: Ulcer Cleansing: Ulcer Cleansing: Rinsed/Irrigated with Rinsed/Irrigated with Rinsed/Irrigated with Saline Saline Saline Topical Anesthetic Topical Anesthetic Topical Anesthetic Applied: Other: lidocaine Applied: Other: Lidocaine Applied: Other: lidocaine 4% 4% 4% Procedures Performed: Debridement Debridement N/A Treatment Notes Electronic Signature(s) Signed: 02/03/2017 3:23:33 PM By: Christin Fudge MD, FACS Entered By: Christin Fudge on 02/03/2017 15:23:32 [name] ([id number]) -------------------------------------------------------------------------------- [place] Details Patient Name: [name]. Date of Service: 02/03/2017 2:00 PM Medical Record Number: [id number] Patient Account Number: [id number] Date of Birth/Sex: Jul 16, 1931 (83 y.o. Female) Treating RN: Afful, RN, BSN, Velva Harman Primary Care Demetrick Eichenberger: [name] Other Clinician: Referring Caidon Foti: [name] Treating Skye Plamondon/Extender: Frann Rider in Treatment: 21 Active Inactive ` Abuse / Safety / Falls / Self Care Management Nursing Diagnoses: Impaired physical mobility Potential for falls Goals: Patient will remain injury free Date Initiated: 09/06/2016 Target Resolution Date: 12/02/2016 Goal Status: Active Interventions: Assess fall risk on admission and as needed Assess self care needs on admission and as needed Notes: ` Necrotic Tissue Nursing Diagnoses: Impaired tissue integrity related to necrotic/devitalized tissue Goals: Necrotic/devitalized tissue will be minimized in the wound bed Date Initiated: 09/06/2016 Target Resolution Date:  12/02/2016 Goal Status: Active Interventions: Assess patient pain level pre-, during and post procedure and prior to discharge Notes: ` Orientation to the Wound  Care Program Nursing Diagnoses: [name], [name] ([id number]) Knowledge deficit related to the wound healing center program Goals: Patient/caregiver will verbalize understanding of the [facility] Date Initiated: 09/06/2016 Target Resolution Date: 12/02/2016 Goal Status: Active Interventions: Provide education on orientation to the wound center Notes: ` Pressure Nursing Diagnoses: Knowledge deficit related to management of pressures ulcers Potential for impaired tissue integrity related to pressure, friction, moisture, and shear Goals: Patient will remain free of pressure ulcers Date Initiated: 09/06/2016 Target Resolution Date: 12/02/2016 Goal Status: Active Interventions: Assess: immobility, friction, shearing, incontinence upon admission and as needed Notes: ` Soft Tissue Infection Nursing Diagnoses: Impaired tissue integrity Potential for infection: soft tissue Goals: Patient will remain free of wound infection Date Initiated: 09/06/2016 Target Resolution Date: 12/02/2016 Goal Status: Active Interventions: Assess signs and symptoms of infection every visit Notes: ` Wound/Skin Impairment [name], [name] ([id number]) Nursing Diagnoses: Impaired tissue integrity Goals: Ulcer/skin breakdown will heal within 14 weeks Date Initiated: 09/06/2016 Target Resolution Date: 12/16/2016 Goal Status: Active Interventions: Assess ulceration(s) every visit Notes: Electronic Signature(s) Signed: 02/03/2017 6:06:48 PM By: Regan Lemming BSN, RN Entered By: Regan Lemming on 02/03/2017 15:10:01 [name] ([id number]) -------------------------------------------------------------------------------- Pain Assessment Details Patient Name: [name]. Date of Service: 02/03/2017 2:00 PM Medical Record Number: [id number] Patient Account Number: [id number] Date of Birth/Sex: Dec 14, 1930 (83 y.o. Female) Treating RN: Afful, RN, BSN, Velva Harman Primary Care Ka Bench: [name] Other Clinician: Referring Leonor Darnell: [name] Treating Breyona Swander/Extender: Frann Rider in Treatment: 21 Active Problems Location of Pain Severity and Description of Pain Patient Has Paino No Site Locations With Dressing Change: No Pain Management and Medication Current Pain Management: Electronic Signature(s) Signed: 02/03/2017 6:06:48 PM By: Regan Lemming BSN, RN Entered By: Regan Lemming on 02/03/2017 14:35:40 [name] ([id number]) -------------------------------------------------------------------------------- Patient/Caregiver Education Details Patient Name: [name]. Date of Service: 02/03/2017 2:00 PM Medical Record Number: [id number] Patient Account Number: [id number] Date of Birth/Gender: 1931-06-01 (83 y.o. Female) Treating RN: Afful, RN, BSN, Velva Harman Primary Care Physician: [name] Other Clinician: Referring Physician: [name] Treating Physician/Extender: Frann Rider in Treatment: 21 Education Assessment Education Provided To: Patient Education Topics Provided Welcome To [facility]: Methods: Explain/Verbal Responses: State content correctly Electronic Signature(s) Signed: 02/03/2017 6:06:48 PM By: Regan Lemming BSN, RN Entered By: Regan Lemming on 02/03/2017 17:57:53 [name] ([id number]) -------------------------------------------------------------------------------- Wound Assessment Details Patient Name: [name]. Date of Service: 02/03/2017 2:00 PM Medical Record Number: [id number] Patient Account Number: [id number] Date of Birth/Sex: 26-Jul-1931 (83 y.o. Female) Treating RN: Afful, RN, BSN, Velva Harman Primary Care Yaret Hush: [name] Other Clinician: Referring Draiden Mirsky: [name] Treating Katye Valek/Extender: Frann Rider in Treatment: 21 Wound Status Wound Number: 1 Primary Etiology: Pressure Ulcer Wound Location: Right Calcaneus - Medial Wound Status: Open Wounding  Event: Pressure Injury Comorbid History: Cataracts, Hypertension Date Acquired: 07/02/2016 Weeks Of Treatment: 21 Clustered Wound: No Photos Photo Uploaded By: Regan Lemming on 02/03/2017 18:04:57 Wound Measurements Length: (cm) 1.5 Width: (cm) 2 Depth: (cm) 0.2 Area: (cm) 2.356 Volume: (cm) 0.471 % Reduction in Area: 81% % Reduction in Volume: 61.9% Epithelialization: Small (1-33%) Tunneling: No Undermining: No Wound Description Classification: Category/Stage III Foul Odor Aft Wound Margin: Flat and Intact Due to Produc Exudate Amount: Large Slough/Fibrin Exudate Type: Serosanguineous Exudate Color: red, brown er Cleansing: Yes t Use: No o Yes  Wound Bed Granulation Amount: Medium (34-66%) Exposed Structure Granulation Quality: Pink Fat Layer (Subcutaneous Tissue) Exposed: Yes Necrotic Amount: Small (1-33%) Necrotic Quality: [address] [place] ([id number]) Periwound Skin Texture Texture Color No Abnormalities Noted: No No Abnormalities Noted: No Callus: Yes Atrophie Blanche: No Crepitus: No Cyanosis: No Excoriation: Yes Ecchymosis: No Induration: No Erythema: No Rash: No Hemosiderin Staining: No Scarring: No Mottled: No Pallor: No Moisture Rubor: No No Abnormalities Noted: No Dry / Scaly: No Temperature / Pain Maceration: Yes Temperature: No Abnormality Tenderness on Palpation: Yes Wound Preparation Ulcer Cleansing: Rinsed/Irrigated with Saline Topical Anesthetic Applied: Other: lidocaine 4%, Treatment Notes Wound #1 (Right, Medial Calcaneus) 1. Cleansed with: Clean wound with Normal Saline 4. Dressing Applied: Other dressing (specify in notes) Notes BUNNY BOOTS, AFFINITY SKIN APPLIED BY MD, DRAWTEX FOR DRAINAGE CONTROL Electronic Signature(s) Signed: 02/03/2017 6:06:48 PM By: Regan Lemming BSN, RN Entered By: Regan Lemming on 02/03/2017 14:49:51 [name]  ([id number]) -------------------------------------------------------------------------------- Wound Assessment Details Patient Name: [name]. Date of Service: 02/03/2017 2:00 PM Medical Record Number: [id number] Patient Account Number: [id number] Date of Birth/Sex: [birth year] (83 y.o. Female) Treating RN: Afful, RN, BSN, [place] Primary Care Rozalynn Buege: [name] Other Clinician: Referring Stephannie Broner: [name] Treating Bishoy Cupp/Extender: Frann Rider in Treatment: 21 Wound Status Wound Number: 2 Primary Etiology: Pressure Ulcer Wound Location: Left Calcaneus - Medial Wound Status: Open Wounding Event: Pressure Injury Comorbid History: Cataracts, Hypertension Date Acquired: 07/02/2016 Weeks Of Treatment: 21 Clustered Wound: No Photos Photo Uploaded By: Regan Lemming on 02/03/2017 18:04:58 Wound Measurements Length: (cm) 2.5 % Reduction in Width: (cm) 3 % Reduction in Depth: (cm) 0.2 Epithelializat Area: (cm) 5.89 Tunneling: Volume: (cm) 1.178 Undermining: Area: 70.2% Volume: 40.5% ion: None No No Wound Description Classification: Category/Stage III Foul Odor Aft Wound Margin: Flat and Intact Slough/Fibrin Exudate Amount: Large Exudate Type: Serosanguineous Exudate Color: red, brown er Cleansing: No o Yes Wound Bed Granulation Amount: Medium (34-66%) Exposed Structure Granulation Quality: Pink Fat Layer (Subcutaneous Tissue) Exposed: Yes Necrotic Amount: Medium (34-66%) Necrotic Quality: [address], [place] ([id number]) Periwound Skin Texture Texture Color No Abnormalities Noted: No No Abnormalities Noted: No Callus: Yes Atrophie Blanche: No Crepitus: No Cyanosis: No Excoriation: No Ecchymosis: No Induration: No Erythema: No Rash: No Hemosiderin Staining: No Scarring: No Mottled: No Pallor: No Moisture Rubor: No No Abnormalities Noted: No Dry / Scaly: No Temperature / Pain Maceration: Yes Temperature: No  Abnormality Tenderness on Palpation: Yes Wound Preparation Ulcer Cleansing: Rinsed/Irrigated with Saline Topical Anesthetic Applied: Other: Lidocaine 4%, Treatment Notes Wound #2 (Left, Medial Calcaneus) 1. Cleansed with: Clean wound with Normal Saline 4. Dressing Applied: Other dressing (specify in notes) 5. Secondary Dressing Applied ABD Pad Kerlix/Conform 7. Secured with Tape Notes Drawtex, Nucell in place Electronic Signature(s) Signed: 02/03/2017 6:06:48 PM By: Regan Lemming BSN, RN Entered By: Regan Lemming on 02/03/2017 14:50:12 [name] ([id number]) -------------------------------------------------------------------------------- Wound Assessment Details Patient Name: [name]. Date of Service: 02/03/2017 2:00 PM Medical Record Number: [id number] Patient Account Number: [id number] Date of Birth/Sex: [birth year] (83 y.o. Female) Treating RN: Afful, RN, BSN, Velva Harman Primary Care Chassie Pennix: [name] Other Clinician: Referring Mee Macdonnell: [name] Treating Deriyah Kunath/Extender: Frann Rider in Treatment: 21 Wound Status Wound Number: 7 Primary Etiology: Pressure Ulcer Wound Location: Left Gluteal fold Wound Status: Open Wounding Event: Pressure Injury Comorbid History: Cataracts, Hypertension Date Acquired: 12/24/2016 Weeks Of Treatment: 5 Clustered Wound: No Photos Photo Uploaded By: Regan Lemming on 02/03/2017 18:05:28 Wound Measurements Length: (cm) 0.1 Width: (  cm) 0.1 Depth: (cm) 0.1 Area: (cm) 0.008 Volume: (cm) 0.001 % Reduction in Area: 99.7% % Reduction in Volume: 99.8% Epithelialization: Large (67-100%) Tunneling: No Undermining: No Wound Description Classification: Category/Stage II Foul Odor Aft Wound Margin: Flat and Intact Slough/Fibrin Exudate Amount: None Present er Cleansing: No o No Wound Bed Granulation Amount: None Present (0%) Exposed Structure Necrotic Amount: Small (1-33%) Fascia Exposed: No Necrotic  Quality: Adherent Slough Fat Layer (Subcutaneous Tissue) Exposed: Yes Tendon Exposed: No Muscle Exposed: No Joint Exposed: No [name], Danielle S. ([id number]) Bone Exposed: No Periwound Skin Texture Texture Color No Abnormalities Noted: No No Abnormalities Noted: No Callus: No Atrophie Blanche: No Crepitus: No Cyanosis: No Excoriation: No Ecchymosis: No Induration: No Erythema: No Rash: No Hemosiderin Staining: No Scarring: No Mottled: No Pallor: No Moisture Rubor: No No Abnormalities Noted: No Dry / Scaly: Yes Temperature / Pain Maceration: No Temperature: No Abnormality Wound Preparation Ulcer Cleansing: Rinsed/Irrigated with Saline Topical Anesthetic Applied: Other: lidocaine 4%, Treatment Notes Wound #7 (Left Gluteal fold) 1. Cleansed with: Clean wound with Normal Saline 4. Dressing Applied: Aquacel Ag 5. Secondary Dressing Applied Bordered Foam Dressing Electronic Signature(s) Signed: 02/03/2017 6:06:48 PM By: Regan Lemming BSN, RN Entered By: Regan Lemming on 02/03/2017 14:50:47 [name] ([id number]) -------------------------------------------------------------------------------- [place] Details Patient Name: [name] Date of Service: 02/03/2017 2:00 PM Medical Record Number: [id number] Patient Account Number: [id number] Date of Birth/Sex: Dec 10, 1930 (83 y.o. Female) Treating RN: Afful, RN, BSN, [place] Primary Care Tifanie Gardiner: [name] Other Clinician: Referring Tramaine Snell: [name] Treating Aaliyha Mumford/Extender: Frann Rider in Treatment: 21 Vital Signs Time Taken: 14:35 Temperature (F): 98.3 Height (in): 63 Pulse (bpm): 65 Weight (lbs): 160 Respiratory Rate (breaths/min): 16 Body Mass Index (BMI): 28.3 Blood Pressure (mmHg): 123/65 Reference Range: 80 - 120 mg / dl Electronic Signature(s) Signed: 02/03/2017 6:06:48 PM By: Regan Lemming BSN, RN Entered By: Regan Lemming on 02/03/2017 14:36:45

## 2017-02-07 ENCOUNTER — Ambulatory Visit: Payer: Medicare HMO

## 2017-02-07 VITALS — BP 152/76 | HR 69 | Ht 63.0 in

## 2017-02-07 DIAGNOSIS — R339 Retention of urine, unspecified: Secondary | ICD-10-CM

## 2017-02-07 NOTE — Progress Notes (Signed)
The came in the office complaining that her suprapubic was not in place. She stated that she have not had to empty her bag since her catheter change on Wednesday. The pt leg bag was full w/ about 360ml on today w/ dark, cloudy, and smelly urine. I asked the caregiver how often have she empty the bag. She said she didn't empty the bag this morning, because it was only a little bit. I informed the pt that she needs to increase her fluid intake to 8-8oz of fluids on a daily basis. I also recommended that she make sure the bag is hanging down, not elevated so it can drain appropriate. The pt main complaint is leakage in the diaper. She was given Myrbetriq 25MG   For the bladder spasm.

## 2017-02-07 NOTE — Telephone Encounter (Signed)
Patient came in today for evaluation of her suprapubic tube which appears to be draining well. She was given samples of Mybetriq. She is likely having bladder spasms causing urethral leakage.  Care takers educated.    Hollice Espy, MD

## 2017-02-10 ENCOUNTER — Encounter: Payer: Medicare HMO | Admitting: Surgery

## 2017-02-10 DIAGNOSIS — L89620 Pressure ulcer of left heel, unstageable: Secondary | ICD-10-CM | POA: Diagnosis not present

## 2017-02-11 NOTE — Progress Notes (Addendum)
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 Tissue) Exposed exposed. There is no tunneling or undermining noted. There is a large amount of serosanguineous drainage noted. The wound margin is flat and intact. There is medium (34-66%) pink granulation within the wound bed. There is a small (1-33%) amount of necrotic tissue within the wound bed including Adherent Slough. The periwound skin  appearance exhibited: Callus, Excoriation, Maceration. The periwound skin appearance did not exhibit: Crepitus, Induration, Rash, Scarring, Dry/Scaly, Atrophie Blanche, Cyanosis, Ecchymosis, Hemosiderin Staining, Mottled, Pallor, Rubor, Erythema. Periwound temperature was noted as No Abnormality. The periwound has tenderness on palpation. Wound #2 status is Open. Original cause of wound was Pressure Injury. The wound is located on the Left,Medial Calcaneus. The wound  measures 3.5cm length x 2.9cm width x 0.2cm depth; 7.972cm^2 area and 1.594cm^3 volume. There is Fat Layer (Subcutaneous Tissue) Exposed exposed. There is no tunneling or undermining noted. There is a large amount of serosanguineous drainage noted. The wound margin is flat and intact. There is medium (34-66%) pink granulation within the wound bed. There is a medium (34- 66%) amount of necrotic tissue within the wound bed including Adherent Slough. The periwound skin appearance exhibited: Callus, Maceration. The periwound skin appearance did not exhibit: Crepitus, Excoriation, Induration, Rash, Scarring, Dry/Scaly, Atrophie Blanche, Cyanosis, Ecchymosis, Hemosiderin Staining, Mottled, Pallor, Rubor, Erythema. Periwound temperature was noted as No Abnormality. The periwound has tenderness on palpation. Wound #7 status is Healed - Epithelialized. Original cause of wound was Pressure Injury. The wound is located on the Left Gluteal fold. The wound measures 0cm length x 0cm width x 0cm depth; 0cm^2 area and 0cm^3 volume. 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 ([id number]) Procedures Wound #1 Wound #1 is a Pressure Ulcer located on the Right,Medial Calcaneus . There was a Skin/Subcutaneous Tissue Debridement ([id number]) debridement with total area of 1.53 sq cm performed by Christin Fudge, MD. with the following instrument(s): Curette to remove Viable and Non-Viable tissue/material including Exudate, Fibrin/Slough, and Subcutaneous after achieving pain control using Lidocaine 4% Topical Solution. A time out was conducted at 10:53, prior to the start of the procedure. A Minimum amount of bleeding was controlled with  Pressure. The procedure was tolerated well with a pain level of 0 throughout and a pain level of 0 following the procedure. Post Debridement Measurements: 0.9cm length x 1.7cm width x 0.3cm depth; 0.36cm^3 volume. Post debridement Stage noted as Category/Stage III. Character of Wound/Ulcer Post Debridement is improved. Severity of Tissue Post Debridement is: Fat layer exposed. Post procedure Diagnosis Wound #1: Same as Pre-Procedure Wound #1 is a Pressure Ulcer located on the Right,Medial Calcaneus. A skin graft procedure using a bioengineered skin substitute/cellular or tissue based product was performed by Christin Fudge, MD. Other was applied and secured with Steri-Strips. 6.25 sq cm of product was utilized and 0 sq cm was wasted. Post Application, mepitel 1 was applied. A Time Out was conducted at 10:57, prior to the start of the procedure. The procedure was tolerated well with a pain level of 0 throughout and a pain level of 0 following the procedure. Post procedure Diagnosis Wound #1: Same as Pre-Procedure . Wound #2 Wound #2 is a Pressure Ulcer located on the Left,Medial Calcaneus . There was a Skin/Subcutaneous Tissue Debridement ([id number]) debridement with total area of 10.15 sq cm performed by Christin Fudge, MD. with the following instrument(s): Curette to remove Viable and Non-Viable tissue/material including Exudate, Fibrin/Slough, and Subcutaneous after achieving pain control using Lidocaine 4% Topical Solution. A time out was conducted at 10:51, prior to the start of the procedure. A Minimum amount of bleeding was controlled with  Pressure. The procedure was tolerated well with a pain level of 0 throughout and a pain level of 0 following the procedure. Post Debridement Measurements: 3.5cm length x 2.9cm width x 0.3cm depth; 2.392cm^3 volume. Post debridement Stage noted as Category/Stage III. Character of Wound/Ulcer Post Debridement is improved. Severity of Tissue Post  Debridement is: Fat layer exposed. Post procedure Diagnosis Wound #2: Same as Pre-Procedure Wound #2 is a Pressure Ulcer located on the Left,Medial Calcaneus. A skin graft procedure using a bioengineered skin substitute/cellular or tissue based product was performed by Christin Fudge, MD. Other was applied and secured with Steri-Strips. 6 sq cm of product was utilized and 0 sq cm was wasted. Post Application, mepitel 1 was applied. A Time Out was conducted at 10:57, prior to the start of the procedure. The procedure was tolerated well with a pain level of 0 throughout and a pain level of 0 following the ZELL, DOUCETTE. ([id number]) procedure. Post procedure Diagnosis Wound #2: Same as Pre-Procedure General Notes: 2x3 Nushield donated. No Charge for this product. Plan Skin Barriers/Peri-Wound Care: Wound #1 Right,Medial Calcaneus: Skin Prep Wound #2 Left,Medial Calcaneus: Skin Prep Primary Wound Dressing: Wound #1 Right,Medial Calcaneus: Other: - AFFINITY....SKIN SUBSTITUTE Wound #2 Left,Medial Calcaneus: Other: - NUSHIELD...Marland KitchenSKIN SUBSTITUTE Secondary Dressing: Wound #1 Right,Medial Calcaneus: ABD pad Conform/Kerlix Aquacel Ag - to macerated areas Drawtex Other - heel cups or ABD pads to support offloading Wound #2 Left,Medial Calcaneus: Gauze, ABD and Kerlix/Conform Aquacel Ag - to macerated areas Drawtex Dressing Change Frequency: Wound #1 Right,Medial Calcaneus: Other: - HH TO CHANGE ON MONDAY.....PLEASE DO NOT GO BEYOUND 2X2 GAUZE AND STERI STRIPS. Wound #2 Left,Medial Calcaneus: Other: - HH TO CHANGE ON MONDAY.....PLEASE DO NOT GO BEYOUND 2X2 GAUZE AND STERI STRIPS. Follow-up Appointments: Wound #1 Right,Medial Calcaneus: Return Appointment in 1 week. Wound #2 Left,Medial Calcaneus: Return Appointment in 1 week. Edema Control: Wound #1 Right,Medial Calcaneus: Elevate legs to the level of the heart and pump ankles as often as possible Wound #2 Left,Medial  Calcaneus: Elevate legs to the level of the heart and pump ankles as often as possible Off-Loading: Wound #1 Right,Medial Calcaneus: [place], [name] ([id number]) Other: - float heels when in bed; keep pressure off during the day. Sage Boots at night. Wound #2 Left,Medial Calcaneus: Other: - float heels when in bed; keep pressure off during the day. Sage Boots at night. Home Health: [facility] Visits - [place] Nurse may visit PRN to address patient s wound care needs. FACE TO FACE ENCOUNTER: MEDICARE and MEDICAID PATIENTS: I certify that this patient is under my care and that I had a face-to-face encounter that meets the physician face-to-face encounter requirements with this patient on this date. The encounter with the patient was in whole or in part for the following MEDICAL CONDITION: (primary reason for [place]) MEDICAL NECESSITY: I certify, that based on my findings, NURSING services are a medically necessary home health service. HOME BOUND STATUS: I certify that my clinical findings support that this patient is homebound (i.e., Due to illness or injury, pt requires aid of supportive devices such as crutches, cane, wheelchairs, walkers, the use of special transportation or the assistance of another person to leave their place of residence. There is a normal inability to leave the home and doing so requires considerable and taxing effort. Other absences are for medical reasons / religious services and are infrequent or of short duration when for other reasons). If current dressing causes regression in wound condition, may  D/C ordered dressing product/s and apply Normal Saline Moist Dressing daily until next [place] / Other MD appointment. [place] of regression in wound condition at [phone]. Please direct any NON-WOUND related issues/requests for orders to patient's Primary Care Physician Wound #1 Right,Medial  Calcaneus: [place] Visits - [place] Nurse may visit PRN to address patient s wound care needs. FACE TO FACE ENCOUNTER: MEDICARE and MEDICAID PATIENTS: I certify that this patient is under my care and that I had a face-to-face encounter that meets the physician face-to-face encounter requirements with this patient on this date. The encounter with the patient was in whole or in part for the following MEDICAL CONDITION: (primary reason for [place]) MEDICAL NECESSITY: I certify, that based on my findings, NURSING services are a medically necessary home health service. HOME BOUND STATUS: I certify that my clinical findings support that this patient is homebound (i.e., Due to illness or injury, pt requires aid of supportive devices such as crutches, cane, wheelchairs, walkers, the use of special transportation or the assistance of another person to leave their place of residence. There is a normal inability to leave the home and doing so requires considerable and taxing effort. Other absences are for medical reasons / religious services and are infrequent or of short duration when for other reasons). If current dressing causes regression in wound condition, may D/C ordered dressing product/s and apply Normal Saline Moist Dressing daily until next [place] / Other MD appointment. [place] of regression in wound condition at [phone]. Please direct any NON-WOUND related issues/requests for orders to patient's Primary Care Physician After appropriate debridement, I have recommended: 1. the skin substitute Nushield has been applied to the left heel and will be left undisturbed for the week 2. the skin substitute Affinity has been applied to the right heel and will not be disturbed for the week 3. Adequate proteins, vitamin A, vitamin C and zinc. 4. a bordered foam to the left gluteal region. KORTNY, LIRETTE ([id number]) 5. Offloading  has been stressed with her again in great detail especially about change in position while sitting and sleeping Electronic Signature(s) Signed: 02/16/2017 11:26:24 AM By: Gretta Cool RN, BSN, Kim RN, BSN Signed: 02/17/2017 4:01:18 PM By: Christin Fudge MD, FACS Previous Signature: 02/10/2017 11:38:06 AM Version By: Christin Fudge MD, FACS Entered By: Gretta Cool RN, BSN, Kim on 02/16/2017 11:26:24 [name] ([id number]) -------------------------------------------------------------------------------- [place] Details Patient Name: [name]. Date of Service: 02/10/2017 Medical Record Number: [id number] Patient Account Number: [id number] Date of Birth/Sex: 1931-01-06 (83 y.o. 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 No charge for product. Electronic Signature(s) Signed: 02/16/2017 11:26:02 AM By:  Gretta Cool RN, BSN, Kim RN, BSN Signed: 02/17/2017 4:01:18 PM By: Christin Fudge MD, FACS Previous Signature: 02/10/2017 11:38:38 AM Version By: Christin Fudge MD, FACS Entered By: Gretta Cool RN, BSN, Kim on 02/16/2017 11:26:02 [name] ([id number])

## 2017-02-11 NOTE — Progress Notes (Signed)
ASHLEY, BULTEMA ([id number]) Visit Report for 02/10/2017 Arrival Information Details Patient Name: [name], [name]. Date of Service: 02/10/2017 11:15 AM Medical Record Number: [id number] Patient Account Number: [id number] Date of Birth/Sex: 1930/12/27 (83 y.o. Female) Treating RN: Afful, RN, BSN, Velva Harman Primary Care Doyle Tegethoff: [name] Other Clinician: Referring Aydrien Froman: [name] Treating Zayra Devito/Extender: Frann Rider in Treatment: 22 Visit Information History Since Last Visit All ordered tests and consults were completed: No Patient Arrived: Wheel Chair Added or deleted any medications: No Arrival Time: 10:26 Any new allergies or adverse reactions: No Accompanied By: self Had a fall or experienced change in No activities of daily living that may affect Transfer Assistance: Harrel Lemon Lift risk of falls: Patient Identification Verified: Yes Signs or symptoms of abuse/neglect since last No Secondary Verification Process Yes visito Completed: Hospitalized since last visit: No Patient Requires Transmission-Based No Has Dressing in Place as Prescribed: Yes Precautions: Pain Present Now: No Patient Has Alerts: No Electronic Signature(s) Signed: 02/10/2017 5:08:13 PM By: Regan Lemming BSN, RN Entered By: Regan Lemming on 02/10/2017 10:27:14 [name] ([id number]) -------------------------------------------------------------------------------- Encounter Discharge Information Details Patient Name: [name]. Date of Service: 02/10/2017 11:15 AM Medical Record Number: [id number] Patient Account Number: [id number] Date of Birth/Sex: 05/30/1931 (83 y.o. Female) Treating RN: Afful, RN, BSN, Velva Harman Primary Care Golden Emile: [name] Other Clinician: Referring Rosalio Catterton: [name] Treating Kasi Lasky/Extender: Frann Rider in Treatment: 22 Encounter Discharge Information Items Discharge Pain Level: 0 Discharge Condition: Stable Ambulatory Status:  Wheelchair Discharge Destination: Home Transportation: Private Auto Accompanied By: self Schedule Follow-up Appointment: Yes Medication Reconciliation completed and provided to Patient/Care No Ralph Benavidez: Provided on Clinical Summary of Care: 02/10/2017 Form Type Recipient Paper Patient EB Electronic Signature(s) Signed: 02/10/2017 11:34:48 AM By: Ruthine Dose Entered By: Ruthine Dose on 02/10/2017 11:34:48 [name] ([id number]) -------------------------------------------------------------------------------- General Visit Notes Details Patient Name: [name]. Date of Service: 02/10/2017 11:15 AM Medical Record Number: [id number] Patient Account Number: [id number] Date of Birth/Sex: 18-Aug-1931 (83 y.o. Female) Treating RN: Baruch Gouty, RN, BSN, Velva Harman Primary Care Mesiah Manzo: [name] Other Clinician: Referring Cheryel Kyte: [name] Treating Daune Divirgilio/Extender: Frann Rider in Treatment: 22 Notes Danielle and [place] applied today by Zuniga. Electronic Signature(s) Signed: 02/10/2017 5:06:07 PM By: Regan Lemming BSN, RN Entered By: Regan Lemming on 02/10/2017 17:06:07 [name] ([id number]) -------------------------------------------------------------------------------- Lower Extremity Assessment Details Patient Name: [name]. Date of Service: 02/10/2017 11:15 AM Medical Record Number: [id number] Patient Account Number: [id number] Date of Birth/Sex: Apr 22, 1931 (83 y.o. Female) Treating RN: Afful, RN, BSN, Velva Harman Primary Care Teja Costen: [name] Other Clinician: Referring Crayton Savarese: [name] Treating Hanley Woerner/Extender: Frann Rider in Treatment: 22 Edema Assessment Assessed: [Left: No] [Right: No] Edema: [Left: No] [Right: No] Vascular Assessment Pulses: Dorsalis Pedis Palpable: [Left:Yes] [Right:Yes] Posterior Tibial Extremity colors, hair growth, and conditions: Extremity Color: [Left:Dusky] [Right:Dusky] Hair Growth on  Extremity: [Left:No] [Right:No] Temperature of Extremity: [Left:Warm] [Right:Warm] Capillary Refill: [Left:< 3 seconds] [Right:< 3 seconds] Electronic Signature(s) Signed: 02/10/2017 5:08:13 PM By: Regan Lemming BSN, RN Entered By: Regan Lemming on 02/10/2017 10:34:16 [name] ([id number]) -------------------------------------------------------------------------------- Multi Wound Chart Details Patient Name: [name]. Date of Service: 02/10/2017 11:15 AM Medical Record Number: [id number] Patient Account Number: [id number] Date of Birth/Sex: 03-Oct-1931 (83 y.o. Female) Treating RN: Baruch Gouty, RN, BSN, Velva Harman Primary Care Natesha Hassey: [name] Other Clinician: Referring Dvon Jiles: [name] Treating Ashwin Tibbs/Extender: Frann Rider in Treatment: 22 Vital Signs Height(in): 63 Pulse(bpm): 57 Weight(lbs): 160 Blood Pressure 133/76 (mmHg): Body Mass Index(BMI): 28  Temperature(F): 98.2 Respiratory Rate 16 (breaths/min): Photos: [1:No Photos] [2:No Photos] [7:No Photos] Wound Location: [1:Right Calcaneus - Medial] [2:Left Calcaneus - Medial] [7:Left Gluteal fold] Wounding Event: [1:Pressure Injury] [2:Pressure Injury] [7:Pressure Injury] Primary Etiology: [1:Pressure Ulcer] [2:Pressure Ulcer] [7:Pressure Ulcer] Comorbid History: [1:Cataracts, Hypertension] [2:Cataracts, Hypertension] [7:N/A] Date Acquired: [1:07/02/2016] [2:07/02/2016] [7:12/24/2016] Weeks of Treatment: [1:22] [2:22] [7:6] Wound Status: [1:Open] [2:Open] [7:Healed - Epithelialized] Measurements L x W x D 0.9x1.7x0.2 [2:3.5x2.9x0.2] [7:0x0x0] (cm) Area (cm) : [1:1.202] [2:7.972] [7:0] Volume (cm) : [1:0.24] [2:1.594] [7:0] % Reduction in Area: [1:90.30%] [2:59.70%] [7:100.00%] % Reduction in Volume: 80.60% [2:19.50%] [7:100.00%] Classification: [1:Category/Stage III] [2:Category/Stage III] [7:Category/Stage II] Exudate Amount: [1:Large] [2:Large] [7:N/A] Exudate Type: [1:Serosanguineous]  [2:Serosanguineous] [7:N/A] Exudate Color: [1:red, brown] [2:red, brown] [7:N/A] Foul Odor After [1:Yes] [2:No] [7:N/A] Cleansing: Odor Anticipated Due to No [2:N/A] [7:N/A] Product Use: Wound Margin: [1:Flat and Intact] [2:Flat and Intact] [7:N/A] Granulation Amount: [1:Medium (34-66%)] [2:Medium (34-66%)] [7:N/A] Granulation Quality: [1:Pink] [2:Pink] [7:N/A] Necrotic Amount: [1:Small (1-33%)] [2:Medium (34-66%)] [7:N/A] Exposed Structures: [1:Fat Layer (Subcutaneous Tissue) Exposed: Yes] [2:Fat Layer (Subcutaneous Tissue) Exposed: Yes] [7:N/A] Epithelialization: [1:Small (1-33%)] [2:None] [7:N/A] Debridement: Debridement ([id number] Debridement ([id number]- N/A 11047) 11047) Pre-procedure 10:53 10:51 N/A Verification/Time Out Taken: Pain Zuniga: Lidocaine 4% Topical Lidocaine 4% Topical N/A Solution Solution Tissue Debrided: Fibrin/Slough, Exudates, Fibrin/Slough, Exudates, N/A Subcutaneous Subcutaneous Level: Skin/Subcutaneous Skin/Subcutaneous N/A Tissue Tissue Debridement Area (sq 1.53 10.15 N/A cm): Instrument: Curette Curette N/A Bleeding: Minimum Minimum N/A Hemostasis Achieved: Pressure Pressure N/A Procedural Pain: 0 0 N/A Post Procedural Pain: 0 0 N/A Debridement Treatment Procedure was tolerated Procedure was tolerated N/A Response: well well Post Debridement 0.9x1.7x0.3 3.5x2.9x0.3 N/A Measurements L x W x D (cm) Post Debridement 0.36 2.392 N/A Volume: (cm) Post Debridement Category/Stage III Category/Stage III N/A Stage: Periwound Skin Texture: Excoriation: Yes Callus: Yes No Abnormalities Noted Callus: Yes Excoriation: No Induration: No Induration: No Crepitus: No Crepitus: No Rash: No Rash: No Scarring: No Scarring: No Periwound Skin Maceration: Yes Maceration: Yes No Abnormalities Noted Moisture: Dry/Scaly: No Dry/Scaly: No Periwound Skin Color: Atrophie Blanche: No Atrophie Blanche: No No Abnormalities Noted Cyanosis: No Cyanosis:  No Ecchymosis: No Ecchymosis: No Erythema: No Erythema: No Hemosiderin Staining: No Hemosiderin Staining: No Mottled: No Mottled: No Pallor: No Pallor: No Rubor: No Rubor: No Temperature: No Abnormality No Abnormality N/A Tenderness on Yes Yes No Palpation: Wound Preparation: Ulcer Cleansing: Ulcer Cleansing: N/A Rinsed/Irrigated with Rinsed/Irrigated with Saline Saline [name], Danielle S. ([id number]) Topical Anesthetic Topical Anesthetic Applied: Other: lidocaine Applied: Other: Lidocaine 4% 4% Procedures Performed: Debridement Debridement N/A Treatment Notes Electronic Signature(s) Signed: 02/10/2017 11:34:28 AM By: Christin Fudge Zuniga, FACS Entered By: Christin Fudge on 02/10/2017 11:34:28 [name] ([id number]) -------------------------------------------------------------------------------- [place] Details Patient Name: [name] Date of Service: 02/10/2017 11:15 AM Medical Record Number: [id number] Patient Account Number: [id number] Date of Birth/Sex: Feb 27, 1931 (83 y.o. Female) Treating RN: Afful, RN, BSN, Velva Harman Primary Care Jolane Bankhead: [name] Other Clinician: Referring Bo Rogue: [name] Treating Malachi Suderman/Extender: Frann Rider in Treatment: 22 Active Inactive ` Abuse / Safety / Falls / Self Care Management Nursing Diagnoses: Impaired physical mobility Potential for falls Goals: Patient will remain injury free Date Initiated: 09/06/2016 Target Resolution Date: 12/02/2016 Goal Status: Active Interventions: Assess fall risk on admission and as needed Assess self care needs on admission and as needed Notes: ` Necrotic Tissue Nursing Diagnoses: Impaired tissue integrity related to necrotic/devitalized tissue Goals: Necrotic/devitalized tissue will be minimized in the wound bed Date Initiated: 09/06/2016 Target Resolution Date: 12/02/2016  Goal Status: Active Interventions: Assess patient pain level pre-,  during and post procedure and prior to discharge Notes: ` Orientation to the Wound Care Program Nursing Diagnoses: [name], [name] ([id number]) Knowledge deficit related to the wound healing center program Goals: Patient/caregiver will verbalize understanding of the [place] Date Initiated: 09/06/2016 Target Resolution Date: 12/02/2016 Goal Status: Active Interventions: Provide education on orientation to the wound center Notes: ` Pressure Nursing Diagnoses: Knowledge deficit related to management of pressures ulcers Potential for impaired tissue integrity related to pressure, friction, moisture, and shear Goals: Patient will remain free of pressure ulcers Date Initiated: 09/06/2016 Target Resolution Date: 12/02/2016 Goal Status: Active Interventions: Assess: immobility, friction, shearing, incontinence upon admission and as needed Notes: ` Soft Tissue Infection Nursing Diagnoses: Impaired tissue integrity Potential for infection: soft tissue Goals: Patient will remain free of wound infection Date Initiated: 09/06/2016 Target Resolution Date: 12/02/2016 Goal Status: Active Interventions: Assess signs and symptoms of infection every visit Notes: ` Wound/Skin Impairment [name], [name] ([id number]) Nursing Diagnoses: Impaired tissue integrity Goals: Ulcer/skin breakdown will heal within 14 weeks Date Initiated: 09/06/2016 Target Resolution Date: 12/16/2016 Goal Status: Active Interventions: Assess ulceration(s) every visit Notes: Electronic Signature(s) Signed: 02/10/2017 5:08:13 PM By: Regan Lemming BSN, RN Entered By: Regan Lemming on 02/10/2017 10:50:22 [name] ([id number]) -------------------------------------------------------------------------------- Pain Assessment Details Patient Name: [name]. Date of Service: 02/10/2017 11:15 AM Medical Record Number: [id number] Patient Account Number: [id number] Date of Birth/Sex: [birth year]  (83 y.o. Female) Treating RN: Afful, RN, BSN, Velva Harman Primary Care Kavir Savoca: [name] Other Clinician: Referring Willa Brocks: [name] Treating Keny Donald/Extender: Frann Rider in Treatment: 22 Active Problems Location of Pain Severity and Description of Pain Patient Has Paino No Site Locations With Dressing Change: No Pain Management and Medication Current Pain Management: Electronic Signature(s) Signed: 02/10/2017 5:08:13 PM By: Regan Lemming BSN, RN Entered By: Regan Lemming on 02/10/2017 10:27:26 [name] ([id number]) -------------------------------------------------------------------------------- Patient/Caregiver Education Details Patient Name: [name]. Date of Service: 02/10/2017 11:15 AM Medical Record Number: [id number] Patient Account Number: [id number] Date of Birth/Gender: 06/11/1931 (83 y.o. Female) Treating RN: Baruch Gouty, RN, BSN, Velva Harman Primary Care Physician: [name] Other Clinician: Referring Physician: [name] Treating Physician/Extender: Frann Rider in Treatment: 22 Education Assessment Education Provided To: Patient Education Topics Provided Wound/Skin Impairment: Handouts: Other: pressure relief Methods: Explain/Verbal Responses: State content correctly Electronic Signature(s) Signed: 02/10/2017 5:08:13 PM By: Regan Lemming BSN, RN Entered By: Regan Lemming on 02/10/2017 10:53:29 [name] ([id number]) -------------------------------------------------------------------------------- Wound Assessment Details Patient Name: [name]. Date of Service: 02/10/2017 11:15 AM Medical Record Number: [id number] Patient Account Number: [id number] Date of Birth/Sex: 23-Jan-1931 (83 y.o. Female) Treating RN: Afful, RN, BSN, [place] Primary Care Farhad Burleson: [name] Other Clinician: Referring Kwanza Cancelliere: [name] Treating Altheria Shadoan/Extender: Frann Rider in Treatment: 22 Wound Status Wound  Number: 1 Primary Etiology: Pressure Ulcer Wound Location: Right Calcaneus - Medial Wound Status: Open Wounding Event: Pressure Injury Comorbid History: Cataracts, Hypertension Date Acquired: 07/02/2016 Weeks Of Treatment: 22 Clustered Wound: No Photos Photo Uploaded By: Regan Lemming on 02/10/2017 15:32:57 Wound Measurements Length: (cm) 0.9 Width: (cm) 1.7 Depth: (cm) 0.2 Area: (cm) 1.202 Volume: (cm) 0.24 % Reduction in Area: 90.3% % Reduction in Volume: 80.6% Epithelialization: Small (1-33%) Tunneling: No Undermining: No Wound Description Classification: Category/Stage III Wound Margin: Flat and Intact Exudate Amount: Large Exudate Type: Serosanguineous Exudate Color: red, brown Foul Odor After Cleansing: Yes Due to Product Use: No Slough/Fibrino Yes Wound Bed Granulation Amount:  Medium (34-66%) Exposed Structure Granulation Quality: Pink Fat Layer (Subcutaneous Tissue) Exposed: Yes Necrotic Amount: Small (1-33%) Necrotic Quality: [address] [place] ([id number]) Periwound Skin Texture Texture Color No Abnormalities Noted: No No Abnormalities Noted: No Callus: Yes Atrophie Blanche: No Crepitus: No Cyanosis: No Excoriation: Yes Ecchymosis: No Induration: No Erythema: No Rash: No Hemosiderin Staining: No Scarring: No Mottled: No Pallor: No Moisture Rubor: No No Abnormalities Noted: No Dry / Scaly: No Temperature / Pain Maceration: Yes Temperature: No Abnormality Tenderness on Palpation: Yes Wound Preparation Ulcer Cleansing: Rinsed/Irrigated with Saline Topical Anesthetic Applied: Other: lidocaine 4%, Treatment Notes Wound #1 (Right, Medial Calcaneus) 1. Cleansed with: Clean wound with Normal Saline 4. Dressing Applied: Other dressing (specify in notes) 5. Secondary Dressing Applied ABD Pad Kerlix/Conform 6. Footwear/Offloading device applied Other footwear/offloading device applied (specify in notes) 7. Secured  with Tape Notes [name], [name], [name] Electronic Signature(s) Signed: 02/10/2017 5:08:13 PM By: Regan Lemming BSN, RN Entered By: Regan Lemming on 02/10/2017 10:41:29 [name] ([id number]) -------------------------------------------------------------------------------- Wound Assessment Details Patient Name: [name]. Date of Service: 02/10/2017 11:15 AM Medical Record Number: [id number] Patient Account Number: [id number] Date of Birth/Sex: [birth year] (83 y.o. Female) Treating RN: Afful, RN, BSN, [place] Primary Care Ryane Konieczny: [name] Other Clinician: Referring Aurora Rody: [name] Treating Ajwa Kimberley/Extender: Frann Rider in Treatment: 22 Wound Status Wound Number: 2 Primary Etiology: Pressure Ulcer Wound Location: Left Calcaneus - Medial Wound Status: Open Wounding Event: Pressure Injury Comorbid History: Cataracts, Hypertension Date Acquired: 07/02/2016 Weeks Of Treatment: 22 Clustered Wound: No Photos Photo Uploaded By: Regan Lemming on 02/10/2017 15:32:58 Wound Measurements Length: (cm) 3.5 Width: (cm) 2.9 Depth: (cm) 0.2 Area: (cm) 7.972 Volume: (cm) 1.594 % Reduction in Area: 59.7% % Reduction in Volume: 19.5% Epithelialization: None Tunneling: No Undermining: No Wound Description Classification: Category/Stage III Wound Margin: Flat and Intact Exudate Amount: Large Exudate Type: Serosanguineous Exudate Color: red, brown Foul Odor After Cleansing: No Slough/Fibrino Yes Wound Bed Granulation Amount: Medium (34-66%) Exposed Structure Granulation Quality: Pink Fat Layer (Subcutaneous Tissue) Exposed: Yes Necrotic Amount: Medium (34-66%) Necrotic Quality: 38 [name], Danielle S. ([id number]) Periwound Skin Texture Texture Color No Abnormalities Noted: No No Abnormalities Noted: No Callus: Yes Atrophie Blanche: No Crepitus: No Cyanosis: No Excoriation:  No Ecchymosis: No Induration: No Erythema: No Rash: No Hemosiderin Staining: No Scarring: No Mottled: No Pallor: No Moisture Rubor: No No Abnormalities Noted: No Dry / Scaly: No Temperature / Pain Maceration: Yes Temperature: No Abnormality Tenderness on Palpation: Yes Wound Preparation Ulcer Cleansing: Rinsed/Irrigated with Saline Topical Anesthetic Applied: Other: Lidocaine 4%, Treatment Notes Wound #2 (Left, Medial Calcaneus) 1. Cleansed with: Clean wound with Normal Saline 4. Dressing Applied: Other dressing (specify in notes) 5. Secondary Dressing Applied ABD Pad Kerlix/Conform 6. Footwear/Offloading device applied Other footwear/offloading device applied (specify in notes) 7. Secured with Tape Notes [name], [name], [name] Electronic Signature(s) Signed: 02/10/2017 5:08:13 PM By: Regan Lemming BSN, RN Entered By: Regan Lemming on 02/10/2017 10:41:59 [name] ([id number]) -------------------------------------------------------------------------------- Wound Assessment Details Patient Name: [name]. Date of Service: 02/10/2017 11:15 AM Medical Record Number: [id number] Patient Account Number: [id number] Date of Birth/Sex: 12/24/1930 (83 y.o. Female) Treating RN: Baruch Gouty, RN, BSN, Velva Harman Primary Care Almer Bushey: [name] Other Clinician: Referring Kyliana Standen: [name] Treating Zonnique Norkus/Extender: Frann Rider in Treatment: 22 Wound Status Wound Number: 7 Primary Etiology: Pressure Ulcer Wound Location: Left Gluteal  fold Wound Status: Healed - Epithelialized Wounding Event: Pressure Injury Date Acquired: 12/24/2016 Weeks Of Treatment: 6 Clustered Wound: No Photos Photo Uploaded By: Regan Lemming on 02/10/2017 15:38:33 Wound Measurements Length: (cm) 0 % Reduction i Width: (cm) 0 % Reduction i Depth: (cm) 0 Area: (cm) 0 Volume: (cm) 0 n Area: 100% n Volume:  100% Wound Description Classification: Category/Stage II Periwound Skin Texture Texture Color No Abnormalities Noted: No No Abnormalities Noted: No [name], OBRIANT. ([id number]) Moisture No Abnormalities Noted: No Electronic Signature(s) Signed: 02/10/2017 5:08:13 PM By: Regan Lemming BSN, RN Entered By: Regan Lemming on 02/10/2017 10:41:07 [name] ([id number]) -------------------------------------------------------------------------------- Vitals Details Patient Name: [name]. Date of Service: 02/10/2017 11:15 AM Medical Record Number: [id number] Patient Account Number: [id number] Date of Birth/Sex: June 10, 1931 (83 y.o. Female) Treating RN: Afful, RN, BSN, [place] Primary Care Omauri Boeve: [name] Other Clinician: Referring Chase Knebel: [name] Treating Fernando Stoiber/Extender: Frann Rider in Treatment: 22 Vital Signs Time Taken: 10:27 Temperature (F): 98.2 Height (in): 63 Pulse (bpm): 57 Weight (lbs): 160 Respiratory Rate (breaths/min): 16 Body Mass Index (BMI): 28.3 Blood Pressure (mmHg): 133/76 Reference Range: 80 - 120 mg / dl Electronic Signature(s) Signed: 02/10/2017 5:08:13 PM By: Regan Lemming BSN, RN Entered By: Regan Lemming on 02/10/2017 58:68:25

## 2017-02-16 ENCOUNTER — Other Ambulatory Visit: Payer: Self-pay

## 2017-02-16 DIAGNOSIS — N3281 Overactive bladder: Secondary | ICD-10-CM

## 2017-02-16 MED ORDER — MIRABEGRON ER 25 MG PO TB24: 25.0000 mg | ORAL_TABLET | Freq: Every day | ORAL | 3 refills | Status: DC

## 2017-02-17 ENCOUNTER — Encounter: Payer: Medicare HMO | Attending: Surgery | Admitting: Surgery

## 2017-02-17 DIAGNOSIS — L89322 Pressure ulcer of left buttock, stage 2: Secondary | ICD-10-CM | POA: Insufficient documentation

## 2017-02-17 DIAGNOSIS — L89610 Pressure ulcer of right heel, unstageable: Secondary | ICD-10-CM | POA: Insufficient documentation

## 2017-02-17 DIAGNOSIS — D649 Anemia, unspecified: Secondary | ICD-10-CM | POA: Diagnosis not present

## 2017-02-17 DIAGNOSIS — I129 Hypertensive chronic kidney disease with stage 1 through stage 4 chronic kidney disease, or unspecified chronic kidney disease: Secondary | ICD-10-CM | POA: Diagnosis not present

## 2017-02-17 DIAGNOSIS — L89620 Pressure ulcer of left heel, unstageable: Secondary | ICD-10-CM | POA: Diagnosis present

## 2017-02-17 DIAGNOSIS — Z993 Dependence on wheelchair: Secondary | ICD-10-CM | POA: Insufficient documentation

## 2017-02-17 DIAGNOSIS — N189 Chronic kidney disease, unspecified: Secondary | ICD-10-CM | POA: Diagnosis not present

## 2017-02-17 DIAGNOSIS — L97512 Non-pressure chronic ulcer of other part of right foot with fat layer exposed: Secondary | ICD-10-CM | POA: Diagnosis not present

## 2017-02-19 NOTE — Progress Notes (Signed)
PIPPA, HANIF ([id number]) Visit Report for 02/17/2017 Arrival Information Details Patient Name: [name]. Date of Service: 02/17/2017 11:15 AM Medical Record Number: [id number] Patient Account Number: [id number] Date of Birth/Sex: 08-23-1931 (83 y.o. Female) Treating RN: Cornell Barman Primary Care Alioune Hodgkin: [name] Other Clinician: Referring Jyair Kiraly: [name] Treating Creig Landin/Extender: Frann Rider in Treatment: 23 Visit Information History Since Last Visit Added or deleted any medications: No Patient Arrived: Wheel Chair Any new allergies or adverse reactions: No Arrival Time: 11:31 Had a fall or experienced change in No activities of daily living that may affect Accompanied By: cg risk of falls: Transfer Assistance: [employer] Signs or symptoms of abuse/neglect since last No Patient Identification Verified: Yes visito Secondary Verification Process Yes Hospitalized since last visit: No Completed: Has Dressing in Place as Prescribed: Yes Patient Requires Transmission-Based No Pain Present Now: No Precautions: Patient Has Alerts: No Electronic Signature(s) Signed: 02/17/2017 5:54:52 PM By: Gretta Cool, RN, BSN, Kim RN, BSN Entered By: Gretta Cool, RN, BSN, Kim on 02/17/2017 11:31:29 [name] ([id number]) -------------------------------------------------------------------------------- Encounter Discharge Information Details Patient Name: [name]. Date of Service: 02/17/2017 11:15 AM Medical Record Number: [id number] Patient Account Number: [id number] Date of Birth/Sex: [birth year] (83 y.o. Female) Treating RN: Cornell Barman Primary Care Janiece Scovill: [name] Other Clinician: Referring Torre Schaumburg: [name] Treating Timothey Dahlstrom/Extender: Frann Rider in Treatment: 23 Encounter Discharge Information Items Discharge Pain Level: 0 Discharge Condition: Stable Ambulatory Status: Wheelchair Discharge Destination: Home Transportation:  Private Auto Accompanied By: caregiver Schedule Follow-up Appointment: Yes Medication Reconciliation completed Yes and provided to Patient/Care Sadik Piascik: Provided on Clinical Summary of Care: 02/17/2017 Form Type Recipient Paper Patient EB Electronic Signature(s) Signed: 02/17/2017 5:54:52 PM By: Gretta Cool, RN, BSN, Kim RN, BSN Previous Signature: 02/17/2017 12:28:07 PM Version By: Ruthine Dose Entered By: Gretta Cool RN, BSN, Kim on 02/17/2017 12:49:14 [name] ([id number]) -------------------------------------------------------------------------------- Lower Extremity Assessment Details Patient Name: [name]. Date of Service: 02/17/2017 11:15 AM Medical Record Number: [id number] Patient Account Number: [id number] Date of Birth/Sex: 11/07/1930 (83 y.o. Female) Treating RN: Cornell Barman Primary Care Kortne All: [name] Other Clinician: Referring Trinita Devlin: [name] Treating Rosina Cressler/Extender: Frann Rider in Treatment: 23 Edema Assessment Assessed: [Left: No] [Right: No] Edema: [Left: No] [Right: No] Vascular Assessment Pulses: Dorsalis Pedis Palpable: [Left:Yes] [Right:Yes] Posterior Tibial Extremity colors, hair growth, and conditions: Extremity Color: [Left:Dusky] [Right:Dusky] Hair Growth on Extremity: [Left:No] [Right:No] Temperature of Extremity: [Left:Warm] [Right:Warm] Capillary Refill: [Left:< 3 seconds] [Right:< 3 seconds] Toe Nail Assessment Left: Right: Thick: No No Discolored: No No Deformed: No No Improper Length and Hygiene: No No Electronic Signature(s) Signed: 02/17/2017 5:54:52 PM By: Gretta Cool, RN, BSN, Kim RN, BSN Entered By: Gretta Cool, RN, BSN, Kim on 02/17/2017 12:34:48 [name] ([id number]) -------------------------------------------------------------------------------- Multi Wound Chart Details Patient Name: [name]. Date of Service: 02/17/2017 11:15 AM Medical Record Number: [id number] Patient Account Number:  [id number] Date of Birth/Sex: August 20, 1931 (83 y.o. Female) Treating RN: Cornell Barman Primary Care Darrly Loberg: [name] Other Clinician: Referring Brinkley Peet: [name] Treating Samay Delcarlo/Extender: Frann Rider in Treatment: 23 Vital Signs Height(in): 63 Pulse(bpm): 54 Weight(lbs): 160 Blood Pressure 119/50 (mmHg): Body Mass Index(BMI): 28 Temperature(F): 98 Respiratory Rate 18 (breaths/min): Photos: [1:No Photos] [2:No Photos] [N/A:N/A] Wound Location: [1:Right Calcaneus - Medial] [2:Left Calcaneus - Medial] [N/A:N/A] Wounding Event: [1:Pressure Injury] [2:Pressure Injury] [N/A:N/A] Primary Etiology: [1:Pressure Ulcer] [2:Pressure Ulcer] [N/A:N/A] Comorbid History: [1:Cataracts, Hypertension] [2:Cataracts, Hypertension] [N/A:N/A] Date Acquired: [1:07/02/2016] [2:07/02/2016] [N/A:N/A] Weeks of Treatment: [1:23] [2:23] [N/A:N/A] Wound Status: [1:Open] [2:Open] [  N/A:N/A] Measurements L x W x D 1x0.2x0.3 [2:3.2x3x0.3] [N/A:N/A] (cm) Area (cm) : [1:0.157] [2:7.54] [N/A:N/A] Volume (cm) : [1:0.047] [2:2.262] [N/A:N/A] % Reduction in Area: [1:98.70%] [2:61.90%] [N/A:N/A] % Reduction in Volume: 96.20% [2:-14.30%] [N/A:N/A] Position 1 (o'clock): [2:4] Maximum Distance 1 [2:0.8] (cm): Tunneling: [1:No] [2:Yes] [N/A:N/A] Classification: [1:Category/Stage III] [2:Category/Stage III] [N/A:N/A] Exudate Amount: [1:Large] [2:Large] [N/A:N/A] Exudate Type: [1:Serosanguineous] [2:Serosanguineous] [N/A:N/A] Exudate Color: [1:red, brown] [2:red, brown] [N/A:N/A] Foul Odor After [1:Yes] [2:No] [N/A:N/A] Cleansing: Odor Anticipated Due to No [2:N/A] [N/A:N/A] Product Use: Wound Margin: [1:Flat and Intact] [2:Flat and Intact] [N/A:N/A] Granulation Amount: [1:Medium (34-66%)] [2:Medium (34-66%)] [N/A:N/A] Granulation Quality: [1:Pink] [2:Pink] [N/A:N/A] Necrotic Amount: Small (1-33%) Medium (34-66%) N/A Exposed Structures: Fat Layer (Subcutaneous Fat Layer (Subcutaneous  N/A Tissue) Exposed: Yes Tissue) Exposed: Yes Epithelialization: Small (1-33%) None N/A Periwound Skin Texture: Callus: Yes Excoriation: No N/A Excoriation: No Induration: No Induration: No Callus: No Crepitus: No Crepitus: No Rash: No Rash: No Scarring: No Scarring: No Periwound Skin Maceration: Yes Maceration: Yes N/A Moisture: Dry/Scaly: No Dry/Scaly: Yes Periwound Skin Color: Ecchymosis: Yes Ecchymosis: Yes N/A Atrophie Blanche: No Atrophie Blanche: No Cyanosis: No Cyanosis: No Erythema: No Erythema: No Hemosiderin Staining: No Hemosiderin Staining: No Mottled: No Mottled: No Pallor: No Pallor: No Rubor: No Rubor: No Temperature: No Abnormality No Abnormality N/A Tenderness on Yes Yes N/A Palpation: Wound Preparation: Ulcer Cleansing: Ulcer Cleansing: N/A Rinsed/Irrigated with Rinsed/Irrigated with Saline Saline Topical Anesthetic Topical Anesthetic Applied: Other: lidocaine Applied: Other: Lidocaine 4% 4% Treatment Notes Electronic Signature(s) Signed: 02/17/2017 5:54:52 PM By: Gretta Cool, RN, BSN, Kim RN, BSN Previous Signature: 02/17/2017 12:20:32 PM Version By: Christin Fudge MD, FACS Entered By: Gretta Cool RN, BSN, Kim on 02/17/2017 12:37:14 [name] ([id number]) -------------------------------------------------------------------------------- [place] Details Patient Name: RONITA, HARGREAVES. Date of Service: 02/17/2017 11:15 AM Medical Record Number: [id number] Patient Account Number: [id number] Date of Birth/Sex: 1931-01-26 (83 y.o. Female) Treating RN: Cornell Barman Primary Care Shyan Scalisi: [name] Other Clinician: Referring Laray Rivkin: [name] Treating Hermann Dottavio/Extender: Frann Rider in Treatment: 23 Active Inactive ` Abuse / Safety / Falls / Self Care Management Nursing Diagnoses: Impaired physical mobility Potential for falls Goals: Patient will remain injury free Date Initiated: 09/06/2016 Target  Resolution Date: 12/02/2016 Goal Status: Active Interventions: Assess fall risk on admission and as needed Assess self care needs on admission and as needed Notes: ` Necrotic Tissue Nursing Diagnoses: Impaired tissue integrity related to necrotic/devitalized tissue Goals: Necrotic/devitalized tissue will be minimized in the wound bed Date Initiated: 09/06/2016 Target Resolution Date: 12/02/2016 Goal Status: Active Interventions: Assess patient pain level pre-, during and post procedure and prior to discharge Notes: ` Orientation to the Wound Care Program Nursing Diagnoses: EMANUEL, CAMPOS ([id number]) Knowledge deficit related to the wound healing center program Goals: Patient/caregiver will verbalize understanding of the [place] Program Date Initiated: 09/06/2016 Target Resolution Date: 12/02/2016 Goal Status: Active Interventions: Provide education on orientation to the wound center Notes: ` Pressure Nursing Diagnoses: Knowledge deficit related to management of pressures ulcers Potential for impaired tissue integrity related to pressure, friction, moisture, and shear Goals: Patient will remain free of pressure ulcers Date Initiated: 09/06/2016 Target Resolution Date: 12/02/2016 Goal Status: Active Interventions: Assess: immobility, friction, shearing, incontinence upon admission and as needed Notes: ` Soft Tissue Infection Nursing Diagnoses: Impaired tissue integrity Potential for infection: soft tissue Goals: Patient will remain free of wound infection Date Initiated: 09/06/2016 Target Resolution Date: 12/02/2016 Goal Status: Active Interventions: Assess signs and symptoms of infection every visit Notes: `  Wound/Skin Impairment JALYNE, BRODZINSKI ([id number]) Nursing Diagnoses: Impaired tissue integrity Goals: Ulcer/skin breakdown will heal within 14 weeks Date Initiated: 09/06/2016 Target Resolution Date: 12/16/2016 Goal Status:  Active Interventions: Assess ulceration(s) every visit Notes: Electronic Signature(s) Signed: 02/17/2017 5:54:52 PM By: Gretta Cool, RN, BSN, Kim RN, BSN Entered By: Gretta Cool, RN, BSN, Kim on 02/17/2017 12:36:57 [name] ([id number]) -------------------------------------------------------------------------------- Pain Assessment Details Patient Name: [name]. Date of Service: 02/17/2017 11:15 AM Medical Record Number: [id number] Patient Account Number: [id number] Date of Birth/Sex: 1931/09/16 (83 y.o. Female) Treating RN: Cornell Barman Primary Care Lillybeth Tal: [name] Other Clinician: Referring Pete Merten: [name] Treating Tarin Johndrow/Extender: Frann Rider in Treatment: 23 Active Problems Location of Pain Severity and Description of Pain Patient Has Paino No Site Locations Pain Management and Medication Current Pain Management: Electronic Signature(s) Signed: 02/17/2017 5:54:52 PM By: Gretta Cool, RN, BSN, Kim RN, BSN Entered By: Gretta Cool, RN, BSN, Kim on 02/17/2017 11:31:36 [name] ([id number]) -------------------------------------------------------------------------------- Patient/Caregiver Education Details Patient Name: [name] Date of Service: 02/17/2017 11:15 AM Medical Record Number: [id number] Patient Account Number: [id number] Date of Birth/Gender: 1931-04-06 (83 y.o. Female) Treating RN: Cornell Barman Primary Care Physician: [name] Other Clinician: Referring Physician: [name] Treating Physician/Extender: Frann Rider in Treatment: 29 Education Assessment Education Provided To: Patient and Caregiver Education Topics Provided Wound/Skin Impairment: Handouts: Caring for Your Ulcer, Other: do not removed dressing beyone steri strips Methods: Demonstration Responses: State content correctly Electronic Signature(s) Signed: 02/17/2017 5:54:52 PM By: Gretta Cool, RN, BSN, Kim RN, BSN Entered By: Gretta Cool, RN, BSN, Kim on 02/17/2017  12:49:55 [name] ([id number]) -------------------------------------------------------------------------------- Wound Assessment Details Patient Name: [name]. Date of Service: 02/17/2017 11:15 AM Medical Record Number: [id number] Patient Account Number: [id number] Date of Birth/Sex: Jan 18, 1931 (83 y.o. Female) Treating RN: Cornell Barman Primary Care Crystel Demarco: [name] Other Clinician: Referring Masen Luallen: [name] Treating Adar Rase/Extender: Frann Rider in Treatment: 23 Wound Status Wound Number: 1 Primary Etiology: Pressure Ulcer Wound Location: Right Calcaneus - Medial Wound Status: Open Wounding Event: Pressure Injury Comorbid History: Cataracts, Hypertension Date Acquired: 07/02/2016 Weeks Of Treatment: 23 Clustered Wound: No Photos Photo Uploaded By: Gretta Cool, RN, BSN, Kim on 02/17/2017 12:37:54 Wound Measurements Length: (cm) 1 Width: (cm) 0.2 Depth: (cm) 0.3 Area: (cm) 0.157 Volume: (cm) 0.047 % Reduction in Area: 98.7% % Reduction in Volume: 96.2% Epithelialization: Small (1-33%) Tunneling: No Undermining: No Wound Description Classification: Category/Stage III Wound Margin: Flat and Intact Exudate Amount: Large Exudate Type: Serosanguineous Exudate Color: red, brown Foul Odor After Cleansing: Yes Due to Product Use: No Slough/Fibrino Yes Wound Bed Granulation Amount: Medium (34-66%) Exposed Structure Granulation Quality: Pink Fat Layer (Subcutaneous Tissue) Exposed: Yes Necrotic Amount: Small (1-33%) Necrotic Quality: Adherent Slough Periwound Skin Texture Texture Color LIZVET, CHUNN. ([id number]) No Abnormalities Noted: No No Abnormalities Noted: No Callus: Yes Atrophie Blanche: No Crepitus: No Cyanosis: No Excoriation: No Ecchymosis: Yes Induration: No Erythema: No Rash: No Hemosiderin Staining: No Scarring: No Mottled: No Pallor: No Moisture Rubor: No No Abnormalities Noted: No Dry / Scaly: No  Temperature / Pain Maceration: Yes Temperature: No Abnormality Tenderness on Palpation: Yes Wound Preparation Ulcer Cleansing: Rinsed/Irrigated with Saline Topical Anesthetic Applied: Other: lidocaine 4%, Treatment Notes Wound #1 (Right, Medial Calcaneus) 1. Cleansed with: Clean wound with Normal Saline 2. Anesthetic Topical Lidocaine 4% cream to wound bed prior to debridement 4. Dressing Applied: Aquacel Ag 5. Secondary Dressing Applied Foam 6. Footwear/Offloading device applied Other footwear/offloading device applied (specify in notes) 7. Secured with  Other (specify in notes) Notes BUNNY BOOTS, AFFINITY AND NUSHIELD SKIN APPLIED BY MD Kerlix and Coban to secure Electronic Signature(s) Signed: 02/17/2017 5:54:52 PM By: Gretta Cool, RN, BSN, Kim RN, BSN Entered By: Gretta Cool, RN, BSN, Kim on 02/17/2017 11:47:07 [name] ([id number]) -------------------------------------------------------------------------------- Wound Assessment Details Patient Name: [name]. Date of Service: 02/17/2017 11:15 AM Medical Record Number: [id number] Patient Account Number: [id number] Date of Birth/Sex: Dec 20, 1930 (83 y.o. Female) Treating RN: Cornell Barman Primary Care Vinod Mikesell: [name] Other Clinician: Referring Dereke Neumann: [name] Treating Minda Faas/Extender: Frann Rider in Treatment: 23 Wound Status Wound Number: 2 Primary Etiology: Pressure Ulcer Wound Location: Left Calcaneus - Medial Wound Status: Open Wounding Event: Pressure Injury Comorbid History: Cataracts, Hypertension Date Acquired: 07/02/2016 Weeks Of Treatment: 23 Clustered Wound: No Photos Photo Uploaded By: Gretta Cool, RN, BSN, Kim on 02/17/2017 12:37:54 Wound Measurements Length: (cm) 3.2 Width: (cm) 3 Depth: (cm) 0.3 Area: (cm) 7.54 Volume: (cm) 2.262 % Reduction in Area: 61.9% % Reduction in Volume: -14.3% Epithelialization: None Tunneling: Yes Position (o'clock): 4 Maximum Distance: (cm)  0.8 Undermining: No Wound Description Classification: Category/Stage III Wound Margin: Flat and Intact Exudate Amount: Large Exudate Type: Serosanguineous Exudate Color: red, brown Foul Odor After Cleansing: No Slough/Fibrino Yes Wound Bed Granulation Amount: Medium (34-66%) Exposed Structure Granulation Quality: Pink Fat Layer (Subcutaneous Tissue) Exposed: Yes Necrotic Amount: Medium (34-66%) Necrotic Quality: Adherent [address], [place] S. ([id number]) Periwound Skin Texture Texture Color No Abnormalities Noted: No No Abnormalities Noted: No Callus: No Atrophie Blanche: No Crepitus: No Cyanosis: No Excoriation: No Ecchymosis: Yes Induration: No Erythema: No Rash: No Hemosiderin Staining: No Scarring: No Mottled: No Pallor: No Moisture Rubor: No No Abnormalities Noted: No Dry / Scaly: Yes Temperature / Pain Maceration: Yes Temperature: No Abnormality Tenderness on Palpation: Yes Wound Preparation Ulcer Cleansing: Rinsed/Irrigated with Saline Topical Anesthetic Applied: Other: Lidocaine 4%, Treatment Notes Wound #2 (Left, Medial Calcaneus) Notes BUNNY BOOTS, AFFINITY AND NUSHIELD SKIN APPLIED BY MD, DRAWTEX FOR DRAINAGE CONTROL Electronic Signature(s) Signed: 02/17/2017 5:54:52 PM By: Gretta Cool, RN, BSN, Kim RN, BSN Entered By: Gretta Cool, RN, BSN, Kim on 02/17/2017 11:46:31 [name] ([id number]) -------------------------------------------------------------------------------- Vitals Details Patient Name: [name]. Date of Service: 02/17/2017 11:15 AM Medical Record Number: [id number] Patient Account Number: [id number] Date of Birth/Sex: 05/21/1931 (83 y.o. Female) Treating RN: Cornell Barman Primary Care Natasia Sanko: [name] Other Clinician: Referring Tavian Callander: [name] Treating Alaiya Martindelcampo/Extender: Frann Rider in Treatment: 23 Vital Signs Time Taken: 11:31 Temperature (F): 98 Height (in): 63 Pulse (bpm): 54 Weight (lbs):  160 Respiratory Rate (breaths/min): 18 Body Mass Index (BMI): 28.3 Blood Pressure (mmHg): 119/50 Reference Range: 80 - 120 mg / dl Electronic Signature(s) Signed: 02/17/2017 5:54:52 PM By: Gretta Cool, RN, BSN, Kim RN, BSN Entered By: Gretta Cool, RN, BSN, Kim on 02/17/2017 11:31:56

## 2017-02-19 NOTE — Progress Notes (Signed)
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 adneopathy. No adenopathy. No adenopathy. [name], [name] ([id number]) Musculoskeletal Adexa without tenderness or enlargement.. Digits and nails w/o clubbing, cyanosis, infection, petechiae, ischemia, or inflammatory conditions.Marland Kitchen Psychiatric Judgement and insight Intact.. No evidence of depression, anxiety, or agitation.. General Notes: Both the left and right heel wounds need to chart debridement with a #3 curet and bleeding was  controlled with pressure and with Silver nitrate. The wound beds were appropriately prepared and the skin substitutes were then applied and bolstered in place. Integumentary (Hair, Skin) No suspicious lesions. No crepitus or fluctuance. No peri-wound warmth or erythema. No masses.. Wound #1 status is Open. Original cause of wound was Pressure Injury. The wound is located on the Right,Medial Calcaneus. The wound measures 1cm length x 0.2cm width x 0.3cm depth; 0.157cm^2 area and 0.047cm^3 volume. There is Fat Layer (Subcutaneous Tissue)  Exposed exposed. There is no tunneling or undermining noted. There is a large amount of serosanguineous drainage noted. The wound margin is flat and intact. There is medium (34-66%) pink granulation within the wound bed. There is a small (1-33%) amount of necrotic tissue within the wound bed including Adherent Slough. The periwound skin appearance exhibited: Callus, Maceration, Ecchymosis. The periwound skin appearance did not exhibit: Crepitus, Excoriation, Induration, Rash, Scarring, Dry/Scaly, Atrophie Blanche, Cyanosis, Hemosiderin Staining, Mottled, Pallor, Rubor, Erythema. Periwound temperature was noted as No Abnormality. The periwound has tenderness on palpation. Wound #2 status is Open. Original cause of wound was Pressure Injury. The wound is located on the Left,Medial Calcaneus. The wound measures 3.2cm length x 3cm width x 0.3cm depth; 7.54cm^2 area and 2.262cm^3 volume. There is Fat Layer (Subcutaneous Tissue) Exposed exposed. There is no undermining noted, however, there is tunneling at 4:00 with a maximum distance of 0.8cm. There is a large amount of serosanguineous drainage noted. The wound margin is flat and intact. There is medium (34-66%) pink granulation within the wound bed. There is a medium (34-66%) amount of necrotic tissue within the wound bed including Adherent Slough. The periwound skin appearance exhibited: Dry/Scaly, Maceration, Ecchymosis. The periwound skin appearance did not exhibit: Callus, Crepitus, Excoriation, Induration, Rash, Scarring, Atrophie Blanche, Cyanosis, Hemosiderin Staining, Mottled, Pallor, Rubor, Erythema. Periwound temperature was noted as No Abnormality. The periwound has tenderness on palpation. 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 ([id number]) Procedures Wound #1 Wound #1 is a Pressure Ulcer located on the Right,Medial Calcaneus . There was a Skin/Subcutaneous Tissue Debridement ([id number]) debridement with total area of 0.2 sq cm performed by Christin Fudge, MD. with the following instrument(s): Curette to remove Viable tissue/material including Exudate, Fibrin/Slough, Skin, and Subcutaneous after achieving pain control using Other (lidocaine 4%). A time out was conducted at 12:00, prior to the start of the procedure. A Minimum amount of bleeding was controlled with Pressure. The procedure was tolerated well with a pain level of 3 throughout and a pain level of 1 following the procedure. Post Debridement Measurements: 1cm length x 0.2cm width x 0.3cm depth; 0.047cm^3 volume. Post debridement Stage noted as Category/Stage III. Character of Wound/Ulcer Post Debridement requires further debridement. Severity of Tissue Post Debridement is: Fat layer exposed. Post procedure Diagnosis Wound #1: Same as Pre-Procedure Wound #1 is a Pressure Ulcer located on the Right,Medial Calcaneus. A skin graft procedure using a bioengineered skin substitute/cellular or tissue based product was performed by Christin Fudge, MD. Other was applied and secured with Steri-Strips. 3 sq cm of product was utilized and 0 sq cm was wasted. Post Application, mepitel was applied. A Time Out was conducted at 12:00, prior to the start of the procedure. The procedure was tolerated well with  a pain level of 0 throughout and a pain level of 0 following the procedure. Post procedure Diagnosis Wound #1: Same as Pre-Procedure . Wound #2 Wound #2 is a Pressure Ulcer located on the Left,Medial Calcaneus . There was a Skin/Subcutaneous Tissue Debridement ([id number]) debridement with total area of 9.6 sq cm performed by Christin Fudge, MD. with the following instrument(s): Curette to  remove Viable tissue/material including Exudate, Fibrin/Slough, Skin, and Subcutaneous after achieving pain control using Other (lidocaine 4%). A time out was conducted at 12:00, prior to the start of the procedure. A Minimum amount of bleeding was controlled with Pressure. The procedure was tolerated well with a pain level of 3 throughout and a pain level of 1 following the procedure. Post Debridement Measurements: 3.2cm length x 3cm width x 0.4cm depth; 3.016cm^3 volume. Post debridement Stage noted as Category/Stage III. Character of Wound/Ulcer Post Debridement requires further debridement. Severity of Tissue Post Debridement is: Fat layer exposed. Post procedure Diagnosis Wound #2: Same as Pre-Procedure Wound #2 is a Pressure Ulcer located on the Left,Medial Calcaneus. A skin graft procedure using a bioengineered skin substitute/cellular or tissue based product was performed by Christin Fudge, MD. Other was applied and secured with Steri-Strips. 6 sq cm of product was utilized and 0 sq cm was wasted. Post Application, mepitel was applied. A Time Out was conducted at 12:00, prior to the start of the procedure. The procedure was tolerated well with a pain level of 0 throughout and a pain level of 0 following the [name], [name]. ([id number]) procedure. Post procedure Diagnosis Wound #2: Same as Pre-Procedure General Notes: 2x3 Nushield donated. No Charge for this product. Plan Anesthetic: Wound #1 Right,Medial Calcaneus: Topical Lidocaine 4% cream applied to wound bed prior to debridement Wound #2 Left,Medial Calcaneus: Topical Lidocaine 4% cream applied to wound bed prior to debridement Skin Barriers/Peri-Wound Care: Wound #1 Right,Medial Calcaneus: Skin Prep Wound #2 Left,Medial Calcaneus: Skin Prep Primary Wound Dressing: Wound #1 Right,Medial Calcaneus: Other: - AFFINITY....SKIN SUBSTITUTE Wound #2 Left,Medial Calcaneus: Other: - NUSHIELD...Marland KitchenSKIN SUBSTITUTE Secondary  Dressing: Wound #1 Right,Medial Calcaneus: ABD pad Conform/Kerlix Aquacel Ag - to macerated areas XtraSorb Other - heel cups or ABD pads to support offloading Wound #2 Left,Medial Calcaneus: Gauze, ABD and Kerlix/Conform Aquacel Ag - to macerated areas XtraSorb Dressing Change Frequency: Wound #1 Right,Medial Calcaneus: Other: - HH TO CHANGE ON MONDAY.....PLEASE DO NOT GO BEYOND 2X2 GAUZE AND STERI STRIPS. Wound #2 Left,Medial Calcaneus: Other: - HH TO CHANGE ON MONDAY.....PLEASE DO NOT GO BEYOND 2X2 GAUZE AND STERI STRIPS. Follow-up Appointments: Wound #1 Right,Medial Calcaneus: Return Appointment in 1 week. Wound #2 Left,Medial Calcaneus: Return Appointment in 1 week. Edema Control: Wound #1 Right,Medial Calcaneus: [name], [name]. ([id number]) Elevate legs to the level of the heart and pump ankles as often as possible Wound #2 Left,Medial Calcaneus: Elevate legs to the level of the heart and pump ankles as often as possible Off-Loading: Wound #1 Right,Medial Calcaneus: Other: - float heels when in bed; keep pressure off during the day. Sage Boots at night. Wound #2 Left,Medial Calcaneus: Other: - float heels when in bed; keep pressure off during the day. Sage Boots at night. Home Health: [facility] - [place] Nurse may visit PRN to address patient s wound care needs. FACE TO FACE ENCOUNTER: MEDICARE and MEDICAID PATIENTS: I certify that this patient is under my care and that I had a face-to-face encounter that meets the physician face-to-face encounter requirements with this patient on this date. The  encounter with the patient was in whole or in part for the following MEDICAL CONDITION: (primary reason for [place]) MEDICAL NECESSITY: I certify, that based on my findings, NURSING services are a medically necessary home health service. HOME BOUND STATUS: I certify that my clinical findings support that this patient is homebound (i.e., Due  to illness or injury, pt requires aid of supportive devices such as crutches, cane, wheelchairs, walkers, the use of special transportation or the assistance of another person to leave their place of residence. There is a normal inability to leave the home and doing so requires considerable and taxing effort. Other absences are for medical reasons / religious services and are infrequent or of short duration when for other reasons). If current dressing causes regression in wound condition, may D/C ordered dressing product/s and apply Normal Saline Moist Dressing daily until next [place] / Other MD appointment. [place] of regression in wound condition at [phone]. Please direct any NON-WOUND related issues/requests for orders to patient's Primary Care Physician Wound #1 Right,Medial Calcaneus: [place] [facility] - [place] Nurse may visit PRN to address patient s wound care needs. FACE TO FACE ENCOUNTER: MEDICARE and MEDICAID PATIENTS: I certify that this patient is under my care and that I had a face-to-face encounter that meets the physician face-to-face encounter requirements with this patient on this date. The encounter with the patient was in whole or in part for the following MEDICAL CONDITION: (primary reason for [place]) MEDICAL NECESSITY: I certify, that based on my findings, NURSING services are a medically necessary home health service. HOME BOUND STATUS: I certify that my clinical findings support that this patient is homebound (i.e., Due to illness or injury, pt requires aid of supportive devices such as crutches, cane, wheelchairs, walkers, the use of special transportation or the assistance of another person to leave their place of residence. There is a normal inability to leave the home and doing so requires considerable and taxing effort. Other absences are for medical reasons / religious services and are infrequent  or of short duration when for other reasons). If current dressing causes regression in wound condition, may D/C ordered dressing product/s and apply Normal Saline Moist Dressing daily until next [place] / Other MD appointment. Leon of regression in wound condition at [phone]. Please direct any NON-WOUND related issues/requests for orders to patient's Primary Care Physician After appropriate debridement, I have recommended: BRITNEY, NEWSTROM ([id number]) 1. the skin substitute Nushield has been applied to the left heel and will be left undisturbed for the week 2. the skin substitute Affinity has been applied to the right heel and will not be disturbed for the week 3. Adequate proteins, vitamin A, vitamin C and zinc. 4. a bordered foam to the left gluteal region. 5. Offloading has been stressed with her again in great detail especially about change in position while sitting and sleeping Electronic Signature(s) Signed: 02/17/2017 3:59:36 PM By: Christin Fudge MD, FACS Previous Signature: 02/17/2017 12:24:26 PM Version By: Christin Fudge MD, FACS Entered By: Christin Fudge on 02/17/2017 15:59:35 [name] ([id number]) -------------------------------------------------------------------------------- SuperBill Details Patient Name: [name]. Date of Service: 02/17/2017 Medical Record Number: [id number] Patient Account Number: [id number] Date of Birth/Sex: 1930-12-04 (83 y.o. Female) Treating RN: Cornell Barman Primary Care Provider: [name] Other Clinician: Referring Provider: [name] Treating Provider/Extender: Frann Rider in Treatment: 23 Diagnosis Coding ICD-10 Codes Code Description L89.620 Pressure ulcer of  left heel, unstageable L89.610 Pressure ulcer of right heel, unstageable L97.512 Non-pressure chronic ulcer of other part of right foot with fat layer exposed Z99.3 Dependence on wheelchair L89.322 Pressure ulcer of  left buttock, stage 2 Facility Procedures CPT4 Code Description: [id number] 11042 - DEB SUBQ TISSUE 20 SQ CM/< ICD-10 Description Diagnosis L89.620 Pressure ulcer of left heel, unstageable L89.610 Pressure ulcer of right heel, unstageable 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 Modifier: ot with fat lay Quantity: 1 er exposed Physician Procedures CPT4 Code Description: [id number] 11042 - WC PHYS SUBQ TISS 20 SQ CM ICD-10 Description Diagnosis L89.620 Pressure ulcer of left heel, unstageable L89.610 Pressure ulcer of right heel, unstageable 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ot w Modifier: ith fat lay Quantity: 1 er exposed Notes Affinity 1.5 x 1.5 and Nushield 2 x 3 donated for patient use. No charge for product. Electronic Signature(s) Signed: 02/17/2017 12:52:09 PM By: Christin Fudge MD, FACS Entered By: Christin Fudge on 02/17/2017 12:52:08

## 2017-02-24 ENCOUNTER — Encounter: Payer: Medicare HMO | Admitting: Surgery

## 2017-02-24 DIAGNOSIS — D649 Anemia, unspecified: Secondary | ICD-10-CM | POA: Diagnosis not present

## 2017-02-24 DIAGNOSIS — N189 Chronic kidney disease, unspecified: Secondary | ICD-10-CM | POA: Diagnosis not present

## 2017-02-24 DIAGNOSIS — Z993 Dependence on wheelchair: Secondary | ICD-10-CM | POA: Diagnosis not present

## 2017-02-24 DIAGNOSIS — L89322 Pressure ulcer of left buttock, stage 2: Secondary | ICD-10-CM | POA: Diagnosis not present

## 2017-02-24 DIAGNOSIS — L89620 Pressure ulcer of left heel, unstageable: Secondary | ICD-10-CM | POA: Diagnosis not present

## 2017-02-24 DIAGNOSIS — L89610 Pressure ulcer of right heel, unstageable: Secondary | ICD-10-CM | POA: Diagnosis not present

## 2017-02-24 DIAGNOSIS — I129 Hypertensive chronic kidney disease with stage 1 through stage 4 chronic kidney disease, or unspecified chronic kidney disease: Secondary | ICD-10-CM | POA: Diagnosis not present

## 2017-02-24 DIAGNOSIS — L97512 Non-pressure chronic ulcer of other part of right foot with fat layer exposed: Secondary | ICD-10-CM | POA: Diagnosis not present

## 2017-02-26 NOTE — Progress Notes (Signed)
Danielle, Zuniga ([id number]) Visit Report for 02/24/2017 Arrival Information Details Patient Name: [name], [name]. Date of Service: 02/24/2017 11:15 AM Medical Record Number: [id number] Patient Account Number: [id number] Date of Birth/Sex: 10-Aug-1931 (83 y.o. Female) Treating RN: Carolyne Fiscal, Debi Primary Care Taronda Comacho: [name] Other Clinician: Referring Jovi Alvizo: [name] Treating Glenda Kunst/Extender: Frann Rider in Treatment: 24 Visit Information History Since Last Visit All ordered tests and consults were completed: No Patient Arrived: Wheel Chair Added or deleted any medications: No Arrival Time: 11:26 Any new allergies or adverse reactions: No Accompanied By: self Had a fall or experienced change in No activities of daily living that may affect Transfer Assistance: Harrel Lemon Lift risk of falls: Patient Identification Verified: Yes Signs or symptoms of abuse/neglect since last No Secondary Verification Process Yes visito Completed: Hospitalized since last visit: No Patient Requires Transmission-Based No Has Dressing in Place as Prescribed: Yes Precautions: Pain Present Now: No Patient Has Alerts: No Electronic Signature(s) Signed: 02/24/2017 2:56:18 PM By: Alric Quan Entered By: Alric Quan on 02/24/2017 11:26:30 [name] ([id number]) -------------------------------------------------------------------------------- Encounter Discharge Information Details Patient Name: [name]. Date of Service: 02/24/2017 11:15 AM Medical Record Number: [id number] Patient Account Number: [id number] Date of Birth/Sex: 1931-10-15 (83 y.o. Female) Treating RN: Baruch Gouty, RN, BSN, Velva Harman Primary Care Little Bashore: [name] Other Clinician: Referring Renay Crammer: [name] Treating Ajeenah Heiny/Extender: Frann Rider in Treatment: 24 Encounter Discharge Information Items Schedule Follow-up Appointment: No Medication Reconciliation  completed No and provided to Patient/Care Lanika Colgate: Provided on Clinical Summary of Care: 02/24/2017 Form Type Recipient Paper Patient EB Electronic Signature(s) Signed: 02/24/2017 12:04:07 PM By: Ruthine Dose Entered By: Ruthine Dose on 02/24/2017 12:04:07 [name] ([id number]) -------------------------------------------------------------------------------- Lower Extremity Assessment Details Patient Name: [name]. Date of Service: 02/24/2017 11:15 AM Medical Record Number: [id number] Patient Account Number: [id number] Date of Birth/Sex: [birth year] (83 y.o. Female) Treating RN: Carolyne Fiscal, Debi Primary Care Kymia Simi: [name] Other Clinician: Referring Foye Damron: [name] Treating Latoyia Tecson/Extender: Frann Rider in Treatment: 24 Vascular Assessment Pulses: Dorsalis Pedis Palpable: [Left:Yes] [Right:Yes] Posterior Tibial Extremity colors, hair growth, and conditions: Extremity Color: [Left:Dusky] [Right:Dusky] Hair Growth on Extremity: [Left:No] [Right:No] Temperature of Extremity: [Left:Warm] [Right:Warm] Capillary Refill: [Left:< 3 seconds] [Right:< 3 seconds] Toe Nail Assessment Left: Right: Thick: Yes Yes Discolored: Yes Yes Deformed: Yes Yes Improper Length and Hygiene: Yes Yes Electronic Signature(s) Signed: 02/24/2017 2:56:18 PM By: Alric Quan Entered By: Alric Quan on 02/24/2017 11:37:55 Waltman, [name] ([id number]) -------------------------------------------------------------------------------- Multi Wound Chart Details Patient Name: [name]. Date of Service: 02/24/2017 11:15 AM Medical Record Number: [id number] Patient Account Number: [id number] Date of Birth/Sex: 01/23/1931 (83 y.o. Female) Treating RN: Carolyne Fiscal, Debi Primary Care Olesya Wike: [name] Other Clinician: Referring Ashna Dorough: [name] Treating Tameem Pullara/Extender: Frann Rider in Treatment: 24 Vital Signs Height(in):  63 Pulse(bpm): 56 Weight(lbs): 160 Blood Pressure 106/57 (mmHg): Body Mass Index(BMI): 28 Temperature(F): 97.8 Respiratory Rate 18 (breaths/min): Photos: [1:No Photos] [2:No Photos] [N/A:N/A] Wound Location: [1:Right Calcaneus - Medial] [2:Left Calcaneus - Medial] [N/A:N/A] Wounding Event: [1:Pressure Injury] [2:Pressure Injury] [N/A:N/A] Primary Etiology: [1:Pressure Ulcer] [2:Pressure Ulcer] [N/A:N/A] Comorbid History: [1:Cataracts, Hypertension] [2:Cataracts, Hypertension] [N/A:N/A] Date Acquired: [1:07/02/2016] [2:07/02/2016] [N/A:N/A] Weeks of Treatment: [1:24] [2:24] [N/A:N/A] Wound Status: [1:Open] [2:Open] [N/A:N/A] Measurements L x W x D 1x2x0.2 [2:4.6x3x0.2] [N/A:N/A] (cm) Area (cm) : [1:1.571] [2:10.838] [N/A:N/A] Volume (cm) : [1:0.314] [2:2.168] [N/A:N/A] % Reduction in Area: [1:87.30%] [2:45.20%] [N/A:N/A] % Reduction in Volume: 74.60% [2:-9.60%] [N/A:N/A] Classification: [1:Category/Stage III] [2:Category/Stage III] [N/A:N/A] Exudate Amount: [1:Large] [2:Large] [N/A:N/A]  Exudate Type: [1:Serosanguineous] [2:Serosanguineous] [N/A:N/A] Exudate Color: [1:red, brown] [2:red, brown] [N/A:N/A] Foul Odor After [1:Yes] [2:No] [N/A:N/A] Cleansing: Odor Anticipated Due to No [2:N/A] [N/A:N/A] Product Use: Wound Margin: [1:Flat and Intact] [2:Flat and Intact] [N/A:N/A] Granulation Amount: [1:Large (67-100%)] [2:Small (1-33%)] [N/A:N/A] Granulation Quality: [1:Pink] [2:Red] [N/A:N/A] Necrotic Amount: [1:Small (1-33%)] [2:Large (67-100%)] [N/A:N/A] Necrotic Tissue: [1:Adherent Slough] [2:Eschar, Adherent Slough] [N/A:N/A] Exposed Structures: [1:Fat Layer (Subcutaneous Tissue) Exposed: Yes] [2:Fat Layer (Subcutaneous Tissue) Exposed: Yes] [N/A:N/A] Epithelialization: Small (1-33%) None N/A Debridement: Debridement (34193- Debridement (79024- N/A 11047) 11047) Pre-procedure 11:39 11:39 N/A Verification/Time Out Taken: Pain Control: Lidocaine 4% Topical Lidocaine 4%  Topical N/A Solution Solution Tissue Debrided: Fibrin/Slough, Exudates, Fibrin/Slough, Exudates, N/A Subcutaneous Subcutaneous Level: Skin/Subcutaneous Skin/Subcutaneous N/A Tissue Tissue Debridement Area (sq 2 13.8 N/A cm): Instrument: Forceps, Scissors Forceps, Scissors N/A Bleeding: Minimum Minimum N/A Hemostasis Achieved: Pressure Pressure N/A Procedural Pain: 0 0 N/A Post Procedural Pain: 0 0 N/A Debridement Treatment Procedure was tolerated Procedure was tolerated N/A Response: well well Post Debridement 1.8x2.1x0.2 4.6x3x0.2 N/A Measurements L x W x D (cm) Post Debridement 0.594 2.168 N/A Volume: (cm) Post Debridement Category/Stage III Category/Stage III N/A Stage: Periwound Skin Texture: Callus: Yes Excoriation: No N/A Excoriation: No Induration: No Induration: No Callus: No Crepitus: No Crepitus: No Rash: No Rash: No Scarring: No Scarring: No Periwound Skin Maceration: Yes Maceration: Yes N/A Moisture: Dry/Scaly: No Dry/Scaly: Yes Periwound Skin Color: Ecchymosis: Yes Ecchymosis: Yes N/A Atrophie Blanche: No Atrophie Blanche: No Cyanosis: No Cyanosis: No Erythema: No Erythema: No Hemosiderin Staining: No Hemosiderin Staining: No Mottled: No Mottled: No Pallor: No Pallor: No Rubor: No Rubor: No Temperature: No Abnormality No Abnormality N/A Tenderness on Yes Yes N/A Palpation: Wound Preparation: Ulcer Cleansing: Ulcer Cleansing: N/A Rinsed/Irrigated with Rinsed/Irrigated with Saline Saline Mcaulay, Soliyana S. ([id number]) Topical Anesthetic Topical Anesthetic Applied: Other: lidocaine Applied: Other: Lidocaine 4% 4% Procedures Performed: Cellular or Tissue Based Debridement N/A Product Debridement Treatment Notes Electronic Signature(s) Signed: 02/24/2017 12:04:58 PM By: Christin Fudge MD, FACS Entered By: Christin Fudge on 02/24/2017 12:04:58 [name]  ([id number]) -------------------------------------------------------------------------------- [place] Details Patient Name: [name]. Date of Service: 02/24/2017 11:15 AM Medical Record Number: [id number] Patient Account Number: [id number] Date of Birth/Sex: Mar 13, 1931 (83 y.o. Female) Treating RN: Carolyne Fiscal, Debi Primary Care Shavaughn Seidl: [name] Other Clinician: Referring Kallum Jorgensen: [name] Treating Javion Holmer/Extender: Frann Rider in Treatment: 24 Active Inactive ` Abuse / Safety / Falls / Self Care Management Nursing Diagnoses: Impaired physical mobility Potential for falls Goals: Patient will remain injury free Date Initiated: 09/06/2016 Target Resolution Date: 12/02/2016 Goal Status: Active Interventions: Assess fall risk on admission and as needed Assess self care needs on admission and as needed Notes: ` Necrotic Tissue Nursing Diagnoses: Impaired tissue integrity related to necrotic/devitalized tissue Goals: Necrotic/devitalized tissue will be minimized in the wound bed Date Initiated: 09/06/2016 Target Resolution Date: 12/02/2016 Goal Status: Active Interventions: Assess patient pain level pre-, during and post procedure and prior to discharge Notes: ` Orientation to the Wound Care Program Nursing Diagnoses: MARGARETANN, ABATE ([id number]) Knowledge deficit related to the wound healing center program Goals: Patient/caregiver will verbalize understanding of the [place] Program Date Initiated: 09/06/2016 Target Resolution Date: 12/02/2016 Goal Status: Active Interventions: Provide education on orientation to the wound center Notes: ` Pressure Nursing Diagnoses: Knowledge deficit related to management of pressures ulcers Potential for impaired tissue integrity related to pressure, friction, moisture, and shear Goals: Patient will remain free of pressure ulcers Date Initiated: 09/06/2016 Target  Resolution Date: 12/02/2016 Goal Status: Active Interventions: Assess: immobility, friction, shearing, incontinence upon admission and as needed Notes: ` Soft Tissue Infection Nursing Diagnoses: Impaired tissue integrity Potential for infection: soft tissue Goals: Patient will remain free of wound infection Date Initiated: 09/06/2016 Target Resolution Date: 12/02/2016 Goal Status: Active Interventions: Assess signs and symptoms of infection every visit Notes: ` Wound/Skin Impairment DELVINA, MIZZELL ([id number]) Nursing Diagnoses: Impaired tissue integrity Goals: Ulcer/skin breakdown will heal within 14 weeks Date Initiated: 09/06/2016 Target Resolution Date: 12/16/2016 Goal Status: Active Interventions: Assess ulceration(s) every visit Notes: Electronic Signature(s) Signed: 02/24/2017 2:56:18 PM By: Alric Quan Entered By: Alric Quan on 02/24/2017 11:38:05 [name] ([id number]) -------------------------------------------------------------------------------- Pain Assessment Details Patient Name: [name]. Date of Service: 02/24/2017 11:15 AM Medical Record Number: [id number] Patient Account Number: [id number] Date of Birth/Sex: Feb 02, 1931 (83 y.o. Female) Treating RN: Carolyne Fiscal, Debi Primary Care Meghana Tullo: [name] Other Clinician: Referring Wallis Spizzirri: [name] Treating Clotile Whittington/Extender: Frann Rider in Treatment: 24 Active Problems Location of Pain Severity and Description of Pain Patient Has Paino No Site Locations With Dressing Change: No Pain Management and Medication Current Pain Management: Electronic Signature(s) Signed: 02/24/2017 2:56:18 PM By: Alric Quan Entered By: Alric Quan on 02/24/2017 11:26:37 [name] ([id number]) -------------------------------------------------------------------------------- Wound Assessment Details Patient Name: [name]. Date of Service: 02/24/2017 11:15  AM Medical Record Number: [id number] Patient Account Number: [id number] Date of Birth/Sex: [birth year] (83 y.o. Female) Treating RN: Carolyne Fiscal, Debi Primary Care Cieara Stierwalt: [name] Other Clinician: Referring Naomee Nowland: [name] Treating Ragna Kramlich/Extender: Frann Rider in Treatment: 24 Wound Status Wound Number: 1 Primary Etiology: Pressure Ulcer Wound Location: Right Calcaneus - Medial Wound Status: Open Wounding Event: Pressure Injury Comorbid History: Cataracts, Hypertension Date Acquired: 07/02/2016 Weeks Of Treatment: 24 Clustered Wound: No Photos Photo Uploaded By: Regan Lemming on 02/24/2017 14:01:22 Wound Measurements Length: (cm) 1 Width: (cm) 2 Depth: (cm) 0.2 Area: (cm) 1.571 Volume: (cm) 0.314 % Reduction in Area: 87.3% % Reduction in Volume: 74.6% Epithelialization: Small (1-33%) Tunneling: No Undermining: No Wound Description Classification: Category/Stage III Wound Margin: Flat and Intact Exudate Amount: Large Exudate Type: Serosanguineous Exudate Color: red, brown Foul Odor After Cleansing: Yes Due to Product Use: No Slough/Fibrino Yes Wound Bed Granulation Amount: Large (67-100%) Exposed Structure Granulation Quality: Pink Fat Layer (Subcutaneous Tissue) Exposed: Yes Necrotic Amount: Small (1-33%) Necrotic Quality: Adherent [address] [place]. ([id number]) Periwound Skin Texture Texture Color No Abnormalities Noted: No No Abnormalities Noted: No Callus: Yes Atrophie Blanche: No Crepitus: No Cyanosis: No Excoriation: No Ecchymosis: Yes Induration: No Erythema: No Rash: No Hemosiderin Staining: No Scarring: No Mottled: No Pallor: No Moisture Rubor: No No Abnormalities Noted: No Dry / Scaly: No Temperature / Pain Maceration: Yes Temperature: No Abnormality Tenderness on Palpation: Yes Wound Preparation Ulcer Cleansing: Rinsed/Irrigated with Saline Topical Anesthetic Applied: Other: lidocaine 4%, Electronic  Signature(s) Signed: 02/24/2017 2:56:18 PM By: Alric Quan Entered By: Alric Quan on 02/24/2017 11:36:12 [name] ([id number]) -------------------------------------------------------------------------------- Wound Assessment Details Patient Name: [name]. Date of Service: 02/24/2017 11:15 AM Medical Record Number: [id number] Patient Account Number: [id number] Date of Birth/Sex: 08-21-1931 (83 y.o. Female) Treating RN: Carolyne Fiscal, Debi Primary Care Tejuan Gholson: [name] Other Clinician: Referring Silveria Botz: [name] Treating Alexx Giambra/Extender: Frann Rider in Treatment: 24 Wound Status Wound Number: 2 Primary Etiology: Pressure Ulcer Wound Location: Left Calcaneus - Medial Wound Status: Open Wounding Event: Pressure Injury Comorbid History: Cataracts, Hypertension Date Acquired: 07/02/2016 Weeks Of Treatment: 24 Clustered Wound: No Photos Photo Uploaded  By: Regan Lemming on 02/24/2017 14:05:27 Wound Measurements Length: (cm) 4.6 Width: (cm) 3 Depth: (cm) 0.2 Area: (cm) 10.838 Volume: (cm) 2.168 % Reduction in Area: 45.2% % Reduction in Volume: -9.6% Epithelialization: None Tunneling: No Undermining: No Wound Description Classification: Category/Stage III Wound Margin: Flat and Intact Exudate Amount: Large Exudate Type: Serosanguineous Exudate Color: red, brown Foul Odor After Cleansing: No Slough/Fibrino Yes Wound Bed Granulation Amount: Small (1-33%) Exposed Structure Granulation Quality: Red Fat Layer (Subcutaneous Tissue) Exposed: Yes Necrotic Amount: Large (67-100%) Necrotic Quality: Eschar, [address] [place] S. ([id number]) Periwound Skin Texture Texture Color No Abnormalities Noted: No No Abnormalities Noted: No Callus: No Atrophie Blanche: No Crepitus: No Cyanosis: No Excoriation: No Ecchymosis: Yes Induration: No Erythema: No Rash: No Hemosiderin Staining: No Scarring: No Mottled: No Pallor:  No Moisture Rubor: No No Abnormalities Noted: No Dry / Scaly: Yes Temperature / Pain Maceration: Yes Temperature: No Abnormality Tenderness on Palpation: Yes Wound Preparation Ulcer Cleansing: Rinsed/Irrigated with Saline Topical Anesthetic Applied: Other: Lidocaine 4%, Electronic Signature(s) Signed: 02/24/2017 2:56:18 PM By: Alric Quan Entered By: Alric Quan on 02/24/2017 11:35:32 [name] ([id number]) -------------------------------------------------------------------------------- Vitals Details Patient Name: [name] Date of Service: 02/24/2017 11:15 AM Medical Record Number: [id number] Patient Account Number: [id number] Date of Birth/Sex: 11/08/1930 (83 y.o. Female) Treating RN: Carolyne Fiscal, Debi Primary Care Camden Knotek: [name] Other Clinician: Referring Terran Hollenkamp: [name] Treating Katori Wirsing/Extender: Frann Rider in Treatment: 24 Vital Signs Time Taken: 11:26 Temperature (F): 97.8 Height (in): 63 Pulse (bpm): 56 Weight (lbs): 160 Respiratory Rate (breaths/min): 18 Body Mass Index (BMI): 28.3 Blood Pressure (mmHg): 106/57 Reference Range: 80 - 120 mg / dl Electronic Signature(s) Signed: 02/24/2017 2:56:18 PM By: Alric Quan Entered By: Alric Quan on 02/24/2017 11:27:15

## 2017-02-26 NOTE — Progress Notes (Addendum)
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 had a lot of maceration subcutaneous debris and unhealthy looking tissue and sharp debridement was done and it was decided not to use a skin substitute. The right wound bed looked much healthier and after sharply debriding it, the fifth application of the skin substitute Affinity was used, and bolstered in place Integumentary (Hair, Skin) No suspicious lesions. No crepitus or fluctuance. No  peri-wound warmth or erythema. No masses.. Wound #1 status is Open. Original cause of wound was Pressure Injury. The wound is located on the Right,Medial Calcaneus. The wound measures 1cm length x 2cm width x 0.2cm depth; 1.571cm^2 area and 0.314cm^3 volume. There is Fat Layer (Subcutaneous Tissue) Exposed exposed. There is no tunneling or undermining noted. There is a large amount of serosanguineous drainage noted. The wound margin is flat and intact. There is large (67-100%) pink granulation within the wound bed. There is a small (1-33%) amount of necrotic tissue within the wound bed including Adherent Slough. The periwound skin appearance exhibited: Callus, Maceration, Ecchymosis. The periwound skin appearance did not exhibit: Crepitus, Excoriation, Induration, Rash, Scarring, Dry/Scaly, Atrophie  Blanche, Cyanosis, Hemosiderin Staining, Mottled, Pallor, Rubor, Erythema. Periwound temperature was noted as No Abnormality. The periwound has tenderness on palpation. Wound #2 status is Open. Original cause of wound was Pressure Injury. The wound is located on the Left,Medial Calcaneus. The wound measures 4.6cm length x 3cm width x 0.2cm depth; 10.838cm^2 area and 2.168cm^3 volume. There is Fat Layer (Subcutaneous Tissue) Exposed exposed. There is no tunneling or undermining noted. There is a large amount of serosanguineous drainage noted. The wound margin is flat and intact. There is small (1-33%) red granulation within the wound bed. There is a large (67-100%) amount of necrotic tissue within the wound bed including Eschar and Adherent Slough. The periwound skin appearance exhibited: Dry/Scaly, Maceration, Ecchymosis. The periwound skin appearance did not exhibit: Callus, Crepitus, Excoriation, Induration, Rash, Scarring, Atrophie Blanche, Cyanosis, Hemosiderin Staining, Mottled, Pallor, Rubor, Erythema. Periwound temperature was noted as No Abnormality. The periwound has tenderness on palpation. 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 There was a Skin/Subcutaneous Tissue Debridement ([id number]) debridement with total area of 2 sq cm performed by Christin Fudge, MD. with the following instrument(s): Forceps and Scissors to remove Viable and Non-Viable tissue/material including Exudate, Fibrin/Slough, and Subcutaneous after achieving pain control using Lidocaine 4% Topical Solution. A time out was conducted  at 11:39, prior to the start of the procedure. A Minimum amount of bleeding was controlled with Pressure. The procedure was tolerated well with a pain level of 0 throughout and a pain level of 0 following the procedure. Post Debridement Measurements: 1.8cm length x 2.1cm width x 0.2cm depth; 0.594cm^3 volume. Post debridement Stage noted as Category/Stage III. Character of Wound/Ulcer Post Debridement requires further debridement. Severity of Tissue Post Debridement is: Fat layer exposed. Post procedure Diagnosis Wound #1: Same as Pre-Procedure Wound #1 is a Pressure Ulcer located on the Right,Medial Calcaneus. A skin graft procedure using a bioengineered skin substitute/cellular or tissue based product was performed by Christin Fudge, MD. Other was applied and secured with Steri-Strips. 2.25 sq cm of product was utilized and 0 sq cm was wasted. Post Application, mepitel was applied. A Time Out was conducted at 11:51, prior to the start of the procedure. The procedure was tolerated well with a pain level of 0 throughout and a pain level of 0 following the procedure. Post procedure Diagnosis Wound #1: Same as Pre-Procedure General Notes: affinity 1.5 x 1.5 applied. Donated, no charge for product. Wound #2 Wound #2  is a Pressure Ulcer located on the Left,Medial Calcaneus . There was a Skin/Subcutaneous Tissue Debridement ([id number]) debridement with total area of 13.8 sq cm performed by Christin Fudge, MD. with the following instrument(s): Forceps and Scissors to remove Viable and Non-Viable tissue/material including Exudate, Fibrin/Slough, and Subcutaneous after achieving pain control using Lidocaine 4% Topical Solution. A time out was conducted at 11:39, prior to the start of the procedure. A Minimum amount of bleeding was controlled with Pressure. The procedure was tolerated well with a pain level of 0 throughout and a pain level of 0 following the procedure. Post Debridement Measurements:  4.6cm length x 3cm width x 0.2cm depth; 2.168cm^3 volume. Post debridement Stage noted as Category/Stage III. Character of Wound/Ulcer Post Debridement requires further debridement. Severity of Tissue Post [name], [name] ([id number]) Debridement is: Fat layer exposed. Post procedure Diagnosis Wound #2: Same as Pre-Procedure General Notes: this wound has significant amount of debris today in the base does not look healthy and I will not apply the skin substitute. Plan Anesthetic: Wound #1 Right,Medial Calcaneus: Topical Lidocaine 4% cream applied to wound bed prior to debridement Wound #2 Left,Medial Calcaneus: Topical Lidocaine 4% cream applied to wound bed prior to debridement Skin Barriers/Peri-Wound Care: Wound #1 Right,Medial Calcaneus: Skin Prep Primary Wound Dressing: Wound #1 Right,Medial Calcaneus: Other: - AFFINITY....SKIN SUBSTITUTE Wound #2 Left,Medial Calcaneus: Aquacel Ag Secondary Dressing: Wound #1 Right,Medial Calcaneus: ABD pad Conform/Kerlix Aquacel Ag - to macerated areas XtraSorb Other - heel cups or ABD pads to support offloading Wound #2 Left,Medial Calcaneus: Dry Gauze Gauze and Kerlix/Conform Other - allevyn heel cup Dressing Change Frequency: Wound #1 Right,Medial Calcaneus: Other: - HH TO CHANGE ON MONDAY.....PLEASE DO NOT GO BEYOND 2X2 GAUZE AND STERI STRIPS. Wound #2 Left,Medial Calcaneus: Change dressing every other day. Follow-up Appointments: Wound #1 Right,Medial Calcaneus: Return Appointment in 1 week. Wound #2 Left,Medial Calcaneus: Return Appointment in 1 week. Edema Control: Wound #1 Right,Medial Calcaneus: Elevate legs to the level of the heart and pump ankles as often as possible Wound #2 Left,Medial Calcaneus: Solimine, Joycelyn S. ([id number]) Elevate legs to the level of the heart and pump ankles as often as possible Off-Loading: Wound #1 Right,Medial Calcaneus: Other: - float heels when in bed; keep pressure off during the day.  Sage Boots at night. Wound #2 Left,Medial Calcaneus: Other: - float heels when in bed; keep pressure off during the day. Sage Boots at night. Home Health: [place] [facility] [place] Nurse may visit PRN to address patient s wound care needs. FACE TO FACE ENCOUNTER: MEDICARE and MEDICAID PATIENTS: I certify that this patient is under my care and that I had a face-to-face encounter that meets the physician face-to-face encounter requirements with this patient on this date. The encounter with the patient was in whole or in part for the following MEDICAL CONDITION: (primary reason for [place]) MEDICAL NECESSITY: I certify, that based on my findings, NURSING services are a medically necessary home health service. HOME BOUND STATUS: I certify that my clinical findings support that this patient is homebound (i.e., Due to illness or injury, pt requires aid of supportive devices such as crutches, cane, wheelchairs, walkers, the use of special transportation or the assistance of another person to leave their place of residence. There is a normal inability to leave the home and doing so requires considerable and taxing effort. Other absences are for medical reasons / religious services and are infrequent or of short duration when for other reasons). If current  dressing causes regression in wound condition, may D/C ordered dressing product/s and apply Normal Saline Moist Dressing daily until next [place] / Other MD appointment. [place] of regression in wound condition at [phone]. Please direct any NON-WOUND related issues/requests for orders to patient's Primary Care Physician Wound #1 Right,Medial Calcaneus: [place] [facility] - [place] Nurse may visit PRN to address patient s wound care needs. FACE TO FACE ENCOUNTER: MEDICARE and MEDICAID PATIENTS: I certify that this patient is under my care and that I had a  face-to-face encounter that meets the physician face-to-face encounter requirements with this patient on this date. The encounter with the patient was in whole or in part for the following MEDICAL CONDITION: (primary reason for [place]) MEDICAL NECESSITY: I certify, that based on my findings, NURSING services are a medically necessary home health service. HOME BOUND STATUS: I certify that my clinical findings support that this patient is homebound (i.e., Due to illness or injury, pt requires aid of supportive devices such as crutches, cane, wheelchairs, walkers, the use of special transportation or the assistance of another person to leave their place of residence. There is a normal inability to leave the home and doing so requires considerable and taxing effort. Other absences are for medical reasons / religious services and are infrequent or of short duration when for other reasons). If current dressing causes regression in wound condition, may D/C ordered dressing product/s and apply Normal Saline Moist Dressing daily until next [place] / Other MD appointment. [place] of regression in wound condition at [phone]. Please direct any NON-WOUND related issues/requests for orders to patient's Primary Care Physician After appropriate debridement, I have recommended: 1. Silver alginate on alternate days to be applied to the left heel. 2. the skin substitute Affinity has been applied to the right heel and will not be disturbed for the week Wishart, Kandyce S. ([id number]) 3. Adequate proteins, vitamin A, vitamin C and zinc. 4. a bordered foam to the left gluteal region. 5. Offloading has been stressed with her again in great detail especially about change in position while sitting and sleeping Electronic Signature(s) Signed: 02/28/2017 4:48:28 PM By: Christin Fudge MD, FACS Signed: 02/28/2017 5:01:54 PM By: Gretta Cool RN, BSN, Kim RN, BSN Previous Signature:  02/24/2017 12:11:57 PM Version By: Christin Fudge MD, FACS Entered By: Gretta Cool RN, BSN, Kim on 02/28/2017 10:55:02 [name] ([id number]) -------------------------------------------------------------------------------- SuperBill Details Patient Name: [name]. Date of Service: 02/24/2017 Medical Record Number: [id number] Patient Account Number: [id number] Date of Birth/Sex: 04-17-1931 (83 y.o. 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, stage 2 Facility Procedures CPT4 Code Description: 38937342 11042 - DEB SUBQ TISSUE 20 SQ CM/< ICD-10 Description Diagnosis L89.620 Pressure ulcer of left heel, unstageable L89.610 Pressure ulcer of right heel, unstageable 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o Modifier: t with fat la Quantity: 1 yer exposed CPT4 Code Description: [id number] 15275 - SKIN SUB GRAFT FACE/NK/HF/G ICD-10 Description Diagnosis L89.620 Pressure ulcer of left heel, unstageable L89.610 Pressure ulcer of right heel, unstageable Modifier: Quantity: 1 Physician Procedures  CPT4 Code Description: 2951884 11042 - WC PHYS SUBQ TISS 20 SQ CM ICD-10 Description Diagnosis L89.620 Pressure ulcer of left heel, unstageable L89.610 Pressure ulcer of right heel, unstageable 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ot Modifier: with fat lay Quantity: 1 er exposed CPT4 Code Description: 1660630 16010 - WC PHYS SKIN SUB GRAFT FACE/NK/HF/G Description KAIMANA, NEUZIL  ([id number]) Modifier: Quantity: 1 Notes Right heel: affinity 1.5 x 1.5 applied. Donated, no charge for product. left heel: debridement. Electronic Signature(s) Signed: 02/28/2017 10:54:18 AM By: Gretta Cool RN, BSN, Kim RN, BSN Signed: 02/28/2017 4:48:28 PM By: Christin Fudge MD, FACS Previous Signature: 02/24/2017 12:12:50 PM Version By: Christin Fudge MD, FACS Entered By: Gretta Cool RN, BSN, Kim on 02/28/2017 10:33:32

## 2017-03-03 ENCOUNTER — Encounter: Payer: Medicare HMO | Admitting: Surgery

## 2017-03-03 ENCOUNTER — Ambulatory Visit (INDEPENDENT_AMBULATORY_CARE_PROVIDER_SITE_OTHER): Payer: Medicare HMO

## 2017-03-03 VITALS — BP 98/60 | HR 55 | Ht 63.0 in | Wt 160.0 lb

## 2017-03-03 DIAGNOSIS — R339 Retention of urine, unspecified: Secondary | ICD-10-CM

## 2017-03-03 NOTE — Progress Notes (Signed)
Suprapubic Cath Change  Patient is present today for a suprapubic catheter change due to urinary retention.  24ml of water was drained from the balloon, a 16FR foley cath was removed from the tract with out difficulty.  Site was cleaned and prepped in a sterile fashion with betadine.  A 16FR foley cath was replaced into the tract no complications were noted. Urine return was noted, 10 ml of sterile water was inflated into the balloon and a night bag was attached for drainage.  Patient tolerated well.   Preformed by: Toniann Fail, LPN & Luci Bank, CMA  Blood pressure 98/60, pulse (!) 55, height 5\' 3"  (1.6 m), weight 160 lb (72.6 kg).

## 2017-03-04 ENCOUNTER — Encounter: Payer: Medicare HMO | Admitting: Surgery

## 2017-03-04 ENCOUNTER — Other Ambulatory Visit
Admission: RE | Admit: 2017-03-04 | Discharge: 2017-03-04 | Disposition: A | Discharge status: Home / Self Care | Payer: Medicare HMO | Source: Other Acute Inpatient Hospital | Attending: Obstetrics and Gynecology | Admitting: Obstetrics and Gynecology

## 2017-03-04 DIAGNOSIS — S91309A Unspecified open wound, unspecified foot, initial encounter: Secondary | ICD-10-CM | POA: Diagnosis present

## 2017-03-04 DIAGNOSIS — L89620 Pressure ulcer of left heel, unstageable: Secondary | ICD-10-CM | POA: Diagnosis not present

## 2017-03-06 NOTE — Progress Notes (Addendum)
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 11:33 AM, Height: 63 in, Weight: 160 lbs, BMI: 28.3, Temperature: 97.8 F, Pulse: 55 bpm, Respiratory Rate: 18 breaths/min, Blood Pressure: 114/56 mmHg. Eyes Nonicteric. Reactive to light. Ears, Nose, Mouth, and Throat  Lips, teeth, and gums WNL.Marland Kitchen Moist mucosa without lesions. [name], [name] ([id number]) Neck supple and nontender. No palpable supraclavicular or cervical adenopathy. Normal sized without goiter. Respiratory WNL. No retractions.. Cardiovascular Pedal Pulses WNL. No clubbing, cyanosis or edema. Lymphatic No adneopathy. No adenopathy. No adenopathy. Musculoskeletal Adexa without tenderness or enlargement.. Digits and nails w/o clubbing, cyanosis, infection, petechiae, ischemia, or inflammatory conditions.Marland Kitchen Psychiatric Judgement and insight Intact.. No evidence of depression, anxiety, or agitation.. General Notes: the right lateral heel has good resolution and needed a bit of sharp debridement before the sixth application of the skin substitute Affinity was used and bolstered in place. the left  medial heel continues to have a lot of subcutaneous debris and maceration and does not look healthy and sharp debridement was done today. Integumentary (Hair, Skin) No suspicious lesions. No crepitus or fluctuance. No peri-wound warmth or erythema. No masses.. Wound #1 status is Open. Original cause of wound was Pressure Injury. The wound is located on the Right,Medial Calcaneus. The wound measures 1cm length x 2cm width x 0.3cm depth; 1.571cm^2 area and 0.471cm^3 volume. Wound #2 status is Open. Original cause of wound was Pressure Injury. The wound is located on the Left,Medial Calcaneus. The wound measures 4.6cm length x 3.7cm width x 0.6cm depth; 13.367cm^2 area and 8.02cm^3 volume. There is Fat Layer (Subcutaneous Tissue) Exposed exposed. There is no tunneling noted, however, there is undermining starting at 10:00 and ending at 11:00 with a maximum distance of 1cm. There is a large amount of serosanguineous drainage noted. The wound margin is flat and intact. There is small (1-33%) red granulation within the wound bed. There is a large (67-100%) amount of necrotic tissue within the wound bed including Eschar and Adherent Slough. The periwound skin appearance exhibited: Dry/Scaly, Maceration, Ecchymosis. The periwound skin appearance did not exhibit: Callus, Crepitus, Excoriation, Induration, Rash, Scarring, Atrophie Blanche, Cyanosis, Hemosiderin Staining, Mottled, Pallor, Rubor, Erythema. Periwound temperature was noted as No Abnormality. The periwound has tenderness on palpation. 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 This left foot is not doing well and today I have taken deep tissue cultures and put her on doxycycline empirically The right foot has looked very good and she is had a vendor supplied skin substitute applied today. We will try and work with the home health nurses to make sure dressings appropriately changed Procedures Wound #1 Wound #1 is a Pressure Ulcer located on the Right,Medial Calcaneus . There was a Skin/Subcutaneous Tissue Debridement ([id number]) debridement with total area of 2 sq cm performed by Christin Fudge, MD. with the following instrument(s): Curette to remove Viable and Non-Viable tissue/material including Fibrin/Slough, Skin, and Subcutaneous after achieving pain control using Other (idocaine 4%). A time out was conducted at 11:50, prior to the start of the procedure. A Minimum amount of bleeding was controlled with Pressure. The procedure was tolerated well with a pain level of 4 throughout and a pain level of 3 following the procedure. Post Debridement Measurements: 1cm length x 2cm width x 0.4cm depth; 0.628cm^3 volume. Post debridement Stage noted as Category/Stage III. Character of Wound/Ulcer Post Debridement  is stable. Severity of Tissue Post Debridement is: Fat layer exposed. Post procedure Diagnosis Wound #1: Same as Pre-Procedure Wound #1 is a Pressure Ulcer located on the Right,Medial Calcaneus. A skin graft procedure using a bioengineered skin substitute/cellular or tissue based product was performed by Christin Fudge, MD. Other was applied and secured with Steri-Strips. 3 sq cm of product was utilized and 0 sq cm was wasted. Post Application, mepitel was applied. A Time Out was conducted at 11:55, prior to the start of the procedure. The procedure was tolerated well with a pain level of 0 throughout and a pain level of 0 following the procedure. Post procedure Diagnosis Wound #1: Same as  Pre-Procedure General Notes: Affinity 1.5 x 1.5 applied to wound. Donated product. No charge to patient. [name], [name] ([id number]) Wound #2 Wound #2 is a Pressure Ulcer located on the Left,Medial Calcaneus . There was a Skin/Subcutaneous Tissue Debridement ([id number]) debridement with total area of 17.02 sq cm performed by Christin Fudge, MD. with the following instrument(s): Forceps and Scissors to remove Viable and Non-Viable tissue/material including Fat Layer (and Subcutaneous Tissue) Exposed, Fibrin/Slough, Eschar, Skin, and Subcutaneous after achieving pain control using Other (idocaine 4%). 1 Specimen was taken by a Swab and sent to the lab per facility protocol.A time out was conducted at 11:50, prior to the start of the procedure. A Minimum amount of bleeding was controlled with Pressure. The procedure was tolerated well with a pain level of 4 throughout and a pain level of 3 following the procedure. Post Debridement Measurements: 4.6cm length x 3.7cm width x 0.7cm depth; 9.357cm^3 volume. Post debridement Stage noted as Category/Stage III. Character of Wound/Ulcer Post Debridement requires further debridement. Severity of Tissue Post Debridement is: Fat layer exposed. Post procedure Diagnosis Wound #2: Same as Pre-Procedure Plan Wound Cleansing: Wound #1 Right,Medial Calcaneus: Clean wound with Normal Saline. Wound #2 Left,Medial Calcaneus: Clean wound with Normal Saline. Anesthetic: Wound #1 Right,Medial Calcaneus: Topical Lidocaine 4% cream applied to wound bed prior to debridement Wound #2 Left,Medial Calcaneus: Topical Lidocaine 4% cream applied to wound bed prior to debridement Skin Barriers/Peri-Wound Care: Wound #1 Right,Medial Calcaneus: Skin Prep Wound #2 Left,Medial Calcaneus: Skin Prep Primary Wound Dressing: Wound #1 Right,Medial Calcaneus: Other: - Affinity Skin Substitute Wound #2 Left,Medial Calcaneus: Aquacel Ag Secondary Dressing: Wound #1  Right,Medial Calcaneus: ABD and Kerlix/Conform Other - heel cups Wound #2 Left,Medial Calcaneus: ABD and Kerlix/Conform Other - heel cups Dressing Change Frequency: Wound #1 Right,Medial Calcaneus: ATLANTIS, DELONG ([id number]) Other: - HHRN to change on Monday and Wednesday.Marland KitchenMarland KitchenDo not remove beyond steri-strips (Skin substitute applied 03/04/2017) Wound #2 Left,Medial Calcaneus: Change Dressing Monday, Wednesday, Friday - Friday to be changed in wound clinic Follow-up Appointments: Wound #1 Right,Medial Calcaneus: Return Appointment in 1 week. Wound #2 Left,Medial Calcaneus: Return Appointment in 1 week. Edema Control: Wound #1 Right,Medial Calcaneus: Elevate legs to the level of the heart and pump ankles as often as possible Wound #2 Left,Medial Calcaneus: Elevate legs to the level of the heart and pump ankles as often as possible Off-Loading: Wound #1 Right,Medial Calcaneus: Other: - No pressure on heels!!! Float heel while in chair or bed. Sage boots at night along with floating heels. Wound #2 Left,Medial Calcaneus: Other: - No pressure on heels!!! Float heel while in chair or bed. Sage boots at night along with floating heels. Additional Orders / Instructions: Wound #1 Right,Medial Calcaneus: Increase protein intake. Other: - Vitamins A, C and Zinc Wound #2 Left,Medial Calcaneus: Increase protein intake. Other: -  Vitamins A, C and Zinc Home Health: Wound #1 Right,Medial Calcaneus: Continue Home Health Visits - [place] Nurse may visit PRN to address patient s wound care needs. FACE TO FACE ENCOUNTER: MEDICARE and MEDICAID PATIENTS: I certify that this patient is under my care and that I had a face-to-face encounter that meets the physician face-to-face encounter requirements with this patient on this date. The encounter with the patient was in whole or in part for the following MEDICAL CONDITION: (primary reason for [place]) MEDICAL NECESSITY: I  certify, that based on my findings, NURSING services are a medically necessary home health service. HOME BOUND STATUS: I certify that my clinical findings support that this patient is homebound (i.e., Due to illness or injury, pt requires aid of supportive devices such as crutches, cane, wheelchairs, walkers, the use of special transportation or the assistance of another person to leave their place of residence. There is a normal inability to leave the home and doing so requires considerable and taxing effort. Other absences are for medical reasons / religious services and are infrequent or of short duration when for other reasons). If current dressing causes regression in wound condition, may D/C ordered dressing product/s and apply Normal Saline Moist Dressing daily until next [place] / Other MD appointment. [place] of regression in wound condition at [phone]. Please direct any NON-WOUND related issues/requests for orders to patient's Primary Care Physician Wound #2 Left,Medial Calcaneus: [place] Visits - [place] Nurse may visit PRN to address patient s wound care needs. FACE TO FACE ENCOUNTER: MEDICARE and MEDICAID PATIENTS: I certify that this patient is under my care and that I had a face-to-face encounter that meets the physician face-to-face encounter [name], [name] ([id number]) requirements with this patient on this date. The encounter with the patient was in whole or in part for the following MEDICAL CONDITION: (primary reason for [place]) MEDICAL NECESSITY: I certify, that based on my findings, NURSING services are a medically necessary home health service. HOME BOUND STATUS: I certify that my clinical findings support that this patient is homebound (i.e., Due to illness or injury, pt requires aid of supportive devices such as crutches, cane, wheelchairs, walkers, the use of special transportation or the  assistance of another person to leave their place of residence. There is a normal inability to leave the home and doing so requires considerable and taxing effort. Other absences are for medical reasons / religious services and are infrequent or of short duration when for other reasons). If current dressing causes regression in wound condition, may D/C ordered dressing product/s and apply Normal Saline Moist Dressing daily until next [place] / Other MD appointment. [place] of regression in wound condition at [phone]. Please direct any NON-WOUND related issues/requests for orders to patient's Primary Care Physician Medications-please add to medication list.: Wound #1 Right,Medial Calcaneus: P.O. Antibiotics Wound #2 Left,Medial Calcaneus: P.O. Antibiotics Laboratory ordered were: Wound culture routine - Left heel The following medication(s) was prescribed: doxycycline hyclate oral 100 mg capsule capsule oral bid starting 03/04/2017 The patient's home health care has been substandard and no one has changed the dressing to her left heel for the entire week. This left foot is not doing well and today I have taken deep tissue cultures and put her on doxycycline empirically The right foot has looked very good and she is had a vendor supplied skin substitute applied today. We will try and work  with the home health nurses to make sure dressings appropriately changed Electronic Signature(s) Signed: 03/04/2017 4:07:20 PM By: Christin Fudge MD, FACS Previous Signature: 03/04/2017 12:17:56 PM Version By: Christin Fudge MD, FACS Entered By: Christin Fudge on 03/04/2017 16:07:20 [name] ([id number]) -------------------------------------------------------------------------------- SuperBill Details Patient Name: [name] Date of Service: 03/04/2017 Medical Record Number: [id number] Patient Account Number: [id number] Date of Birth/Sex: [birth year] (83 y.o.  Female) Treating RN: Afful, RN, BSN, [place] Primary Care Provider: [name] Other Clinician: Referring Provider: [name] Treating Provider/Extender: Frann Rider in Treatment: 25 Diagnosis Coding ICD-10 Codes Code Description L89.620 Pressure ulcer of left heel, unstageable L89.610 Pressure ulcer of right heel, unstageable L97.512 Non-pressure chronic ulcer of other part of right foot with fat layer exposed Z99.3 Dependence on wheelchair L89.322 Pressure ulcer of left buttock, stage 2 Facility Procedures CPT4 Code Description: [id number] 15275 - SKIN SUB GRAFT FACE/NK/HF/G ICD-10 Description Diagnosis L89.610 Pressure ulcer of right heel, unstageable Modifier: Quantity: 1 CPT4 Code Description: [id number] 11042 - DEB SUBQ TISSUE 20 SQ CM/< ICD-10 Description Diagnosis L89.620 Pressure ulcer of left heel, unstageable L89.610 Pressure ulcer of right heel, unstageable 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o Modifier: t with fat la Quantity: 1 yer exposed Physician Procedures CPT4 Code Description: [id number] 15275 - WC PHYS SKIN SUB GRAFT FACE/NK/HF/G ICD-10 Description Diagnosis L89.610 Pressure ulcer of right heel, unstageable Modifier: Quantity: 1 CPT4 Code Description: [id number] 11042 - WC PHYS SUBQ TISS 20 SQ CM ICD-10 Description Diagnosis L89.620 Pressure ulcer of left heel, unstageable L89.610 Pressure ulcer of right heel, unstageable Katzenstein, Lyza S. ([id number]) Modifier: Quantity: 1 Notes Affinity 1.5 x 1.5 applied to right heel. Donated product. No charge to patient. Electronic Signature(s) Signed: 03/04/2017 12:18:54 PM By: Christin Fudge MD, FACS Entered By: Christin Fudge on 03/04/2017 12:18:54

## 2017-03-06 NOTE — Progress Notes (Signed)
ZANA, BIANCARDI ([id number]) Visit Report for 03/04/2017 Arrival Information Details Patient Name: [name], [name]. Date of Service: 03/04/2017 11:15 AM Medical Record Number: [id number] Patient Account Number: [id number] Date of Birth/Sex: 1931/02/21 (83 y.o. Female) Treating RN: Afful, RN, BSN, Velva Harman Primary Care Griffon Herberg: [name] Other Clinician: Referring Trashaun Streight: [name] Treating Raelin Pixler/Extender: Frann Rider in Treatment: 25 Visit Information History Since Last Visit All ordered tests and consults were No Patient Arrived: Wheel completed: Chair Added or deleted any medications: No Arrival Time: 11:30 Any new allergies or adverse reactions: No Accompanied By: self Had a fall or experienced change in No Transfer Assistance: [employer] activities of daily living that may affect Patient Identification Verified: Yes risk of falls: Secondary Verification Process Yes Signs or symptoms of abuse/neglect since No Completed: last visito Patient Requires Transmission-Based No Hospitalized since last visit: No Precautions: Has Dressing in Place as Prescribed: Yes Patient Has Alerts: No Has Footwear/Offloading in Place as Yes Prescribed: Left: Other:bunny boots Right: Other:bunny boots Pain Present Now: No Electronic Signature(s) Signed: 03/04/2017 1:22:14 PM By: Regan Lemming BSN, RN Entered By: Regan Lemming on 03/04/2017 11:32:56 [name] ([id number]) -------------------------------------------------------------------------------- Encounter Discharge Information Details Patient Name: [name]. Date of Service: 03/04/2017 11:15 AM Medical Record Number: [id number] Patient Account Number: [id number] Date of Birth/Sex: 1931-05-15 (83 y.o. Female) Treating RN: Baruch Gouty, RN, BSN, Velva Harman Primary Care Denaja Verhoeven: [name] Other Clinician: Referring Jackelynn Hosie: [name] Treating Shanesha Bednarz/Extender: Frann Rider in Treatment:  25 Encounter Discharge Information Items Discharge Pain Level: 3 Discharge Condition: Stable Ambulatory Status: Wheelchair Discharge Destination: Home Transportation: Private Auto Accompanied By: self Schedule Follow-up Appointment: Yes Medication Reconciliation completed and provided to Patient/Care Yes Greg Eckrich: Provided on Clinical Summary of Care: 03/04/2017 Form Type Recipient Paper Patient EB Electronic Signature(s) Signed: 03/04/2017 12:27:45 PM By: Ruthine Dose Entered By: Ruthine Dose on 03/04/2017 12:27:44 [name] ([id number]) -------------------------------------------------------------------------------- Lower Extremity Assessment Details Patient Name: [name]. Date of Service: 03/04/2017 11:15 AM Medical Record Number: [id number] Patient Account Number: [id number] Date of Birth/Sex: March 23, 1931 (83 y.o. Female) Treating RN: Afful, RN, BSN, Velva Harman Primary Care Kalila Adkison: [name] Other Clinician: Referring Charlene Cowdrey: [name] Treating Kelsey Edman/Extender: Frann Rider in Treatment: 25 Vascular Assessment Pulses: Dorsalis Pedis Palpable: [Left:Yes] [Right:Yes] Posterior Tibial Extremity colors, hair growth, and conditions: Extremity Color: [Left:Dusky] [Right:Dusky] Hair Growth on Extremity: [Left:No] [Right:No] Temperature of Extremity: [Left:Warm] [Right:Warm] Capillary Refill: [Left:< 3 seconds] [Right:< 3 seconds] Toe Nail Assessment Left: Right: Thick: Yes Yes Discolored: Yes Yes Deformed: Yes Yes Improper Length and Hygiene: Yes Yes Electronic Signature(s) Signed: 03/04/2017 1:22:14 PM By: Regan Lemming BSN, RN Entered By: Regan Lemming on 03/04/2017 11:48:45 [name] ([id number]) -------------------------------------------------------------------------------- Multi Wound Chart Details Patient Name: [name]. Date of Service: 03/04/2017 11:15 AM Medical Record Number: [id number] Patient Account Number:  [id number] Date of Birth/Sex: 03-29-1931 (83 y.o. Female) Treating RN: Baruch Gouty, RN, BSN, Velva Harman Primary Care Alianys Chacko: [name] Other Clinician: Referring Arihaan Bellucci: [name] Treating Najmah Carradine/Extender: Frann Rider in Treatment: 25 Vital Signs Height(in): 63 Pulse(bpm): 55 Weight(lbs): 160 Blood Pressure 114/56 (mmHg): Body Mass Index(BMI): 28 Temperature(F): 97.8 Respiratory Rate 18 (breaths/min): Photos: [1:No Photos] [2:No Photos] [N/A:N/A] Wound Location: [1:Right, Medial Calcaneus] [2:Left Calcaneus - Medial] [N/A:N/A] Wounding Event: [1:Pressure Injury] [2:Pressure Injury] [N/A:N/A] Primary Etiology: [1:Pressure Ulcer] [2:Pressure Ulcer] [N/A:N/A] Comorbid History: [1:N/A] [2:Cataracts, Hypertension] [N/A:N/A] Date Acquired: [1:07/02/2016] [2:07/02/2016] [N/A:N/A] Weeks of Treatment: [1:25] [2:25] [N/A:N/A] Wound Status: [1:Open] [2:Open] [N/A:N/A] Measurements L x W x D 1x2x0.3 [  2:4.6x3.7x0.6] [N/A:N/A] (cm) Area (cm) : [1:1.571] [2:13.367] [N/A:N/A] Volume (cm) : [1:0.471] [2:8.02] [N/A:N/A] % Reduction in Area: [1:87.30%] [2:32.50%] [N/A:N/A] % Reduction in Volume: 61.90% [2:-305.30%] [N/A:N/A] Starting Position 1 [2:10] (o'clock): Ending Position 1 [2:11] (o'clock): Maximum Distance 1 [2:1] (cm): Undermining: [1:N/A] [2:Yes] [N/A:N/A] Classification: [1:Category/Stage III] [2:Category/Stage III] [N/A:N/A] Exudate Amount: [1:N/A] [2:Large] [N/A:N/A] Exudate Type: [1:N/A] [2:Serosanguineous] [N/A:N/A] Exudate Color: [1:N/A] [2:red, brown] [N/A:N/A] Foul Odor After [1:N/A] [2:Yes] [N/A:N/A] Cleansing: Odor Anticipated Due to N/A [2:No] [N/A:N/A] Product Use: Wound Margin: [1:N/A] [2:Flat and Intact] [N/A:N/A] Granulation Amount: N/A Small (1-33%) N/A Granulation Quality: N/A Red N/A Necrotic Amount: N/A Large (67-100%) N/A Necrotic Tissue: N/A Eschar, Adherent Slough N/A Epithelialization: N/A None N/A Debridement: Debridement ([id number]  Debridement ([id number]- N/A 11047) 11047) Pre-procedure 11:50 11:50 N/A Verification/Time Out Taken: Pain Control: Other Other N/A Tissue Debrided: Fibrin/Slough, Skin, Necrotic/Eschar, N/A Subcutaneous Fibrin/Slough, Fat, Skin, Subcutaneous Level: Skin/Subcutaneous Skin/Subcutaneous N/A Tissue Tissue Debridement Area (sq 2 17.02 N/A cm): Instrument: Curette Forceps, Scissors N/A Bleeding: Minimum Minimum N/A Hemostasis Achieved: Pressure Pressure N/A Procedural Pain: 4 4 N/A Post Procedural Pain: 3 3 N/A Debridement Treatment Procedure was tolerated Procedure was tolerated N/A Response: well well Post Debridement 1x2x0.4 4.6x3.7x0.7 N/A Measurements L x W x D (cm) Post Debridement 0.628 9.357 N/A Volume: (cm) Post Debridement Category/Stage III Category/Stage III N/A Stage: Periwound Skin Texture: No Abnormalities Noted Excoriation: No N/A Induration: No Callus: No Crepitus: No Rash: No Scarring: No Periwound Skin No Abnormalities Noted Maceration: Yes N/A Moisture: Dry/Scaly: Yes Periwound Skin Color: No Abnormalities Noted Ecchymosis: Yes N/A Atrophie Blanche: No Cyanosis: No Erythema: No Hemosiderin Staining: No Mottled: No Pallor: No Rubor: No Temperature: N/A No Abnormality N/A No Yes N/A Chiasson, Rainna S. ([id number]) Tenderness on Palpation: Wound Preparation: N/A Ulcer Cleansing: N/A Rinsed/Irrigated with Saline Topical Anesthetic Applied: Other: Lidocaine 4% Procedures Performed: Cellular or Tissue Based Debridement N/A Product Debridement Treatment Notes Electronic Signature(s) Signed: 03/04/2017 12:13:24 PM By: Christin Fudge MD, FACS Entered By: Christin Fudge on 03/04/2017 12:13:23 [name] ([id number]) -------------------------------------------------------------------------------- [place] Details Patient Name: [name] Date of Service: 03/04/2017 11:15 AM Medical Record Number: [id number] Patient Account  Number: [id number] Date of Birth/Sex: [birth year] (83 y.o. Female) Treating RN: Afful, RN, BSN, Velva Harman Primary Care Ryane Canavan: [name] Other Clinician: Referring Kyrianna Barletta: [name] Treating Zi Newbury/Extender: Frann Rider in Treatment: 25 Active Inactive ` Abuse / Safety / Falls / Self Care Management Nursing Diagnoses: Impaired physical mobility Potential for falls Goals: Patient will remain injury free Date Initiated: 09/06/2016 Target Resolution Date: 12/02/2016 Goal Status: Active Interventions: Assess fall risk on admission and as needed Assess self care needs on admission and as needed Notes: ` Necrotic Tissue Nursing Diagnoses: Impaired tissue integrity related to necrotic/devitalized tissue Goals: Necrotic/devitalized tissue will be minimized in the wound bed Date Initiated: 09/06/2016 Target Resolution Date: 12/02/2016 Goal Status: Active Interventions: Assess patient pain level pre-, during and post procedure and prior to discharge Notes: ` Orientation to the Wound Care Program Nursing Diagnoses: LUEVENIA, MCAVOY ([id number]) Knowledge deficit related to the wound healing center program Goals: Patient/caregiver will verbalize understanding of the [place] Program Date Initiated: 09/06/2016 Target Resolution Date: 12/02/2016 Goal Status: Active Interventions: Provide education on orientation to the wound center Notes: ` Pressure Nursing Diagnoses: Knowledge deficit related to management of pressures ulcers Potential for impaired tissue integrity related to pressure, friction, moisture, and shear Goals: Patient will remain free of pressure ulcers Date Initiated: 09/06/2016 Target Resolution Date: 12/02/2016  Goal Status: Active Interventions: Assess: immobility, friction, shearing, incontinence upon admission and as needed Notes: ` Soft Tissue Infection Nursing Diagnoses: Impaired tissue integrity Potential for  infection: soft tissue Goals: Patient will remain free of wound infection Date Initiated: 09/06/2016 Target Resolution Date: 12/02/2016 Goal Status: Active Interventions: Assess signs and symptoms of infection every visit Notes: ` Wound/Skin Impairment JESSAMY, TOROSYAN ([id number]) Nursing Diagnoses: Impaired tissue integrity Goals: Ulcer/skin breakdown will heal within 14 weeks Date Initiated: 09/06/2016 Target Resolution Date: 12/16/2016 Goal Status: Active Interventions: Assess ulceration(s) every visit Notes: Electronic Signature(s) Signed: 03/04/2017 1:22:14 PM By: Regan Lemming BSN, RN Entered By: Regan Lemming on 03/04/2017 11:53:18 [name] ([id number]) -------------------------------------------------------------------------------- Pain Assessment Details Patient Name: [name]. Date of Service: 03/04/2017 11:15 AM Medical Record Number: [id number] Patient Account Number: [id number] Date of Birth/Sex: 04-16-1931 (83 y.o. Female) Treating RN: Afful, RN, BSN, Velva Harman Primary Care Bilbo Carcamo: [name] Other Clinician: Referring Colan Laymon: [name] Treating Hennesy Sobalvarro/Extender: Frann Rider in Treatment: 25 Active Problems Location of Pain Severity and Description of Pain Patient Has Paino No Site Locations With Dressing Change: No Pain Management and Medication Current Pain Management: Electronic Signature(s) Signed: 03/04/2017 1:22:14 PM By: Regan Lemming BSN, RN Entered By: Regan Lemming on 03/04/2017 11:33:05 [name] ([id number]) -------------------------------------------------------------------------------- Patient/Caregiver Education Details Patient Name: [name]. Date of Service: 03/04/2017 11:15 AM Medical Record Number: [id number] Patient Account Number: [id number] Date of Birth/Gender: April 29, 1931 (83 y.o. Female) Treating RN: Baruch Gouty, RN, BSN, Velva Harman Primary Care Physician: [name] Other Clinician: Referring  Physician: [name] Treating Physician/Extender: Frann Rider in Treatment: 25 Education Assessment Education Provided To: Patient Education Topics Provided Wound/Skin Impairment: Handouts: Caring for Your Ulcer, Other: dressing changes by [facility] as prescribed Methods: Demonstration, Explain/Verbal Responses: State content correctly Electronic Signature(s) Signed: 03/04/2017 1:22:14 PM By: Regan Lemming BSN, RN Entered By: Regan Lemming on 03/04/2017 12:18:47 [name] ([id number]) -------------------------------------------------------------------------------- Wound Assessment Details Patient Name: [name]. Date of Service: 03/04/2017 11:15 AM Medical Record Number: [id number] Patient Account Number: [id number] Date of Birth/Sex: 05/12/1931 (83 y.o. Female) Treating RN: Afful, RN, BSN, [place] Primary Care Layce Sprung: [name] Other Clinician: Referring Claudell Wohler: [name] Treating Brigetta Beckstrom/Extender: Frann Rider in Treatment: 25 Wound Status Wound Number: 1 Primary Etiology: Pressure Ulcer Wound Location: Right, Medial Calcaneus Wound Status: Open Wounding Event: Pressure Injury Date Acquired: 07/02/2016 Weeks Of Treatment: 25 Clustered Wound: No Photos Photo Uploaded By: Regan Lemming on 03/04/2017 13:18:43 Wound Measurements Length: (cm) 1 Width: (cm) 2 Depth: (cm) 0.3 Area: (cm) 1.571 Volume: (cm) 0.471 % Reduction in Area: 87.3% % Reduction in Volume: 61.9% Wound Description Classification: Category/Stage III Periwound Skin Texture Texture Color No Abnormalities Noted: No No Abnormalities Noted: No Moisture No Abnormalities Noted: No Treatment Notes Wound #1 (Right, Medial Calcaneus) 1. Cleansed with: [name] ([id number]) Clean wound with Normal Saline 2. Anesthetic Topical Lidocaine 4% cream to wound bed prior to debridement Notes BUNNY BOOTS, AFFINITY APPLIED BY MD; mepitel secured with steri strips  Kerlix and Coban to secure Electronic Signature(s) Signed: 03/04/2017 1:22:14 PM By: Regan Lemming BSN, RN Entered By: Regan Lemming on 03/04/2017 11:46:17 [name] ([id number]) -------------------------------------------------------------------------------- Wound Assessment Details Patient Name: [name]. Date of Service: 03/04/2017 11:15 AM Medical Record Number: [id number] Patient Account Number: [id number] Date of Birth/Sex: 1930-10-21 (83 y.o. Female) Treating RN: Afful, RN, BSN, [employer] Primary Care Adriel Kessen: [name] Other Clinician: Referring Starsky Nanna: [name] Treating Lindsay Straka/Extender: Frann Rider in Treatment: 25 Wound  Status Wound Number: 2 Primary Etiology: Pressure Ulcer Wound Location: Left Calcaneus - Medial Wound Status: Open Wounding Event: Pressure Injury Comorbid History: Cataracts, Hypertension Date Acquired: 07/02/2016 Weeks Of Treatment: 25 Clustered Wound: No Photos Photo Uploaded By: Regan Lemming on 03/04/2017 13:18:45 Wound Measurements Length: (cm) 4.6 Width: (cm) 3.7 Depth: (cm) 0.6 Area: (cm) 13.367 Volume: (cm) 8.02 % Reduction in Area: 32.5% % Reduction in Volume: -305.3% Epithelialization: None Tunneling: No Undermining: Yes Starting Position (o'clock): 10 Ending Position (o'clock): 11 Maximum Distance: (cm) 1 Wound Description Classification: Category/Stage III Wound Margin: Flat and Intact Exudate Amount: Large Exudate Type: Serosanguineous Exudate Color: red, brown Foul Odor After Cleansing: Yes Due to Product Use: No Slough/Fibrino Yes Wound Bed LAILYNN, [place]. ([id number]) Granulation Amount: Small (1-33%) Exposed Structure Granulation Quality: Red Fat Layer (Subcutaneous Tissue) Exposed: Yes Necrotic Amount: Large (67-100%) Necrotic Quality: Eschar, Adherent Slough Periwound Skin Texture Texture Color No Abnormalities Noted: No No Abnormalities Noted: No Callus: No Atrophie Blanche:  No Crepitus: No Cyanosis: No Excoriation: No Ecchymosis: Yes Induration: No Erythema: No Rash: No Hemosiderin Staining: No Scarring: No Mottled: No Pallor: No Moisture Rubor: No No Abnormalities Noted: No Dry / Scaly: Yes Temperature / Pain Maceration: Yes Temperature: No Abnormality Tenderness on Palpation: Yes Wound Preparation Ulcer Cleansing: Rinsed/Irrigated with Saline Topical Anesthetic Applied: Other: Lidocaine 4%, Treatment Notes Wound #2 (Left, Medial Calcaneus) 1. Cleansed with: Clean wound with Normal Saline 2. Anesthetic Topical Lidocaine 4% cream to wound bed prior to debridement 4. Dressing Applied: Aquacel Ag 5. Secondary Dressing Applied ABD Pad Notes BUNNY BOOTS, Gauze, heel cups, kerlix and coban Electronic Signature(s) Signed: 03/04/2017 1:22:14 PM By: Regan Lemming BSN, RN Entered By: Regan Lemming on 03/04/2017 11:48:02 [name] ([id number]) -------------------------------------------------------------------------------- Vitals Details Patient Name: [name]. Date of Service: 03/04/2017 11:15 AM Medical Record Number: [id number] Patient Account Number: [id number] Date of Birth/Sex: 1930-12-31 (83 y.o. Female) Treating RN: Afful, RN, BSN, [place] Primary Care Jadaya Sommer[place]: [name] Other Clinician: Referring Harriett Azar: [name] Treating Ishia Tenorio/Extender: Frann Rider in Treatment: 25 Vital Signs Time Taken: 11:33 Temperature (F): 97.8 Height (in): 63 Pulse (bpm): 55 Weight (lbs): 160 Respiratory Rate (breaths/min): 18 Body Mass Index (BMI): 28.3 Blood Pressure (mmHg): 114/56 Reference Range: 80 - 120 mg / dl Electronic Signature(s) Signed: 03/04/2017 1:22:14 PM By: Regan Lemming BSN, RN Entered By: Regan Lemming on 03/04/2017 11:33:34

## 2017-03-07 LAB — AEROBIC CULTURE W GRAM STAIN (SUPERFICIAL SPECIMEN)

## 2017-03-07 LAB — AEROBIC CULTURE  (SUPERFICIAL SPECIMEN): GRAM STAIN: NONE SEEN

## 2017-03-10 ENCOUNTER — Encounter: Payer: Medicare HMO | Admitting: Surgery

## 2017-03-10 DIAGNOSIS — L89620 Pressure ulcer of left heel, unstageable: Secondary | ICD-10-CM | POA: Diagnosis not present

## 2017-03-11 NOTE — Progress Notes (Addendum)
Danielle, Zuniga ([id number]) Visit Report for 03/10/2017 Arrival Information Details Patient Name: [name], [name]. Date of Service: 03/10/2017 11:00 AM Medical Record Number: [id number] Patient Account Number: [id number] Date of Birth/Sex: [birth year] (83 y.o. Female) Treating RN: Afful, RN, BSN, Velva Harman Primary Care Monaca Wadas: [name] Other Clinician: Referring Jocabed Cheese: [name] Treating Alizandra Loh/Extender: Frann Rider in Treatment: 26 Visit Information History Since Last Visit All ordered tests and consults were completed: No Patient Arrived: Wheel Chair Added or deleted any medications: No Arrival Time: 11:22 Any new allergies or adverse reactions: No Accompanied By: self Had a fall or experienced change in No activities of daily living that may affect Transfer Assistance: Harrel Lemon Lift risk of falls: Patient Identification Verified: Yes Signs or symptoms of abuse/neglect since last No Secondary Verification Process Yes visito Completed: Hospitalized since last visit: No Patient Requires Transmission-Based No Has Dressing in Place as Prescribed: Yes Precautions: Pain Present Now: Yes Patient Has Alerts: No Electronic Signature(s) Signed: 03/10/2017 1:50:23 PM By: Regan Lemming BSN, RN Entered By: Regan Lemming on 03/10/2017 11:24:08 [name] ([id number]) -------------------------------------------------------------------------------- Encounter Discharge Information Details Patient Name: [name]. Date of Service: 03/10/2017 11:00 AM Medical Record Number: [id number] Patient Account Number: [id number] Date of Birth/Sex: [birth year] (83 y.o. Female) Treating RN: Afful, RN, BSN, Velva Harman Primary Care Rishikesh Khachatryan: [name] Other Clinician: Referring Armoni Depass: [name] Treating Janaisha Tolsma/Extender: Frann Rider in Treatment: 26 Encounter Discharge Information Items Discharge Pain Level: 0 Discharge Condition: Stable Ambulatory Status:  Wheelchair Discharge Destination: Home Transportation: Other Accompanied By: self Schedule Follow-up Appointment: No Medication Reconciliation completed and provided to Patient/Care No Taishawn Smaldone: Provided on Clinical Summary of Care: 03/10/2017 Form Type Recipient Paper Patient EB Electronic Signature(s) Signed: 03/10/2017 1:56:13 PM By: Regan Lemming BSN, RN Previous Signature: 03/10/2017 12:35:16 PM Version By: Ruthine Dose Entered By: Regan Lemming on 03/10/2017 13:56:13 [place], [name] ([id number]) -------------------------------------------------------------------------------- Lower Extremity Assessment Details Patient Name: [name]. Date of Service: 03/10/2017 11:00 AM Medical Record Number: [id number] Patient Account Number: [id number] Date of Birth/Sex: 1931-05-02 (83 y.o. Female) Treating RN: Afful, RN, BSN, Velva Harman Primary Care Maragret Vanacker: [name] Other Clinician: Referring Yailyn Strack: [name] Treating Romi Rathel/Extender: Frann Rider in Treatment: 26 Vascular Assessment Pulses: Dorsalis Pedis Palpable: [Left:Yes] [Right:Yes] Posterior Tibial Extremity colors, hair growth, and conditions: Extremity Color: [Left:Dusky] [Right:Dusky] Hair Growth on Extremity: [Left:No] Temperature of Extremity: [Left:Warm] [Right:Warm] Capillary Refill: [Left:> 3 seconds] [Right:> 3 seconds] Toe Nail Assessment Left: Right: Thick: Yes Yes Discolored: Yes Yes Deformed: Yes Yes Improper Length and Hygiene: Yes Yes Electronic Signature(s) Signed: 03/10/2017 1:50:23 PM By: Regan Lemming BSN, RN Entered By: Regan Lemming on 03/10/2017 11:36:49 [name] ([id number]) -------------------------------------------------------------------------------- Multi Wound Chart Details Patient Name: [name]. Date of Service: 03/10/2017 11:00 AM Medical Record Number: [id number] Patient Account Number: [id number] Date of Birth/Sex: [birth year] (83 y.o. Female) Treating RN:  Baruch Gouty, RN, BSN, Velva Harman Primary Care Chivonne Rascon: [name] Other Clinician: Referring Armina Galloway: [name] Treating Trenese Haft/Extender: Frann Rider in Treatment: 26 Vital Signs Height(in): 63 Pulse(bpm): 58 Weight(lbs): 160 Blood Pressure 128/48 (mmHg): Body Mass Index(BMI): 28 Temperature(F): 97.7 Respiratory Rate 16 (breaths/min): Photos: [1:No Photos] [2:No Photos] [N/A:N/A] Wound Location: [1:Right, Medial Calcaneus] [2:Left, Medial Calcaneus] [N/A:N/A] Wounding Event: [1:Pressure Injury] [2:Pressure Injury] [N/A:N/A] Primary Etiology: [1:Pressure Ulcer] [2:Pressure Ulcer] [N/A:N/A] Date Acquired: [1:07/02/2016] [2:07/02/2016] [N/A:N/A] Weeks of Treatment: [1:26] [2:26] [N/A:N/A] Wound Status: [1:Open] [2:Open] [N/A:N/A] Measurements L x W x D 1x2.2x0.3 [2:4.3x4x0.5] [N/A:N/A] (cm) Area (cm) : [1:1.728] [2:13.509] [N/A:N/A] Volume (cm) : [  1:0.518] [2:6.754] [N/A:N/A] % Reduction in Area: [1:86.00%] [2:31.70%] [N/A:N/A] % Reduction in Volume: 58.10% [2:-241.30%] [N/A:N/A] Classification: [1:Category/Stage III] [2:Category/Stage III] [N/A:N/A] Debridement: [1:Debridement (40086- 76195)] [2:Debridement (09326- 71245)] [N/A:N/A] Pre-procedure [1:12:03] [2:12:05] [N/A:N/A] Verification/Time Out Taken: Pain Control: [1:Lidocaine 4% Topical Solution] [2:Lidocaine 4% Topical Solution] [N/A:N/A] Tissue Debrided: [1:Fibrin/Slough, Fat, Exudates, Subcutaneous] [2:Fibrin/Slough, Fat, Exudates, Subcutaneous] [N/A:N/A] Level: [1:Skin/Subcutaneous Tissue] [2:Skin/Subcutaneous Tissue] [N/A:N/A] Debridement Area (sq [1:2.2] [2:17.2] [N/A:N/A] cm): Instrument: [1:Curette] [2:Curette] [N/A:N/A] Bleeding: [1:Minimum] [2:Minimum] [N/A:N/A] Hemostasis Achieved: Pressure [2:Pressure] [N/A:N/A] Procedural Pain: 0 0 N/A Post Procedural Pain: 0 0 N/A Debridement Treatment Procedure was tolerated Procedure was tolerated N/A Response: well well Post Debridement 1x2.2x0.3  4.3x4x0.5 N/A Measurements L x W x D (cm) Post Debridement 0.518 6.754 N/A Volume: (cm) Post Debridement Category/Stage III Category/Stage III N/A Stage: Periwound Skin Texture: No Abnormalities Noted No Abnormalities Noted N/A Periwound Skin No Abnormalities Noted No Abnormalities Noted N/A Moisture: Periwound Skin Color: No Abnormalities Noted No Abnormalities Noted N/A Tenderness on No No N/A Palpation: Procedures Performed: Debridement Debridement N/A Treatment Notes Electronic Signature(s) Signed: 03/10/2017 12:23:20 PM By: Christin Fudge MD, FACS Entered By: Christin Fudge on 03/10/2017 12:23:19 [name] ([id number]) -------------------------------------------------------------------------------- [place] Details Patient Name: [name]. Date of Service: 03/10/2017 11:00 AM Medical Record Number: [id number] Patient Account Number: [id number] Date of Birth/Sex: 1931/01/30 (83 y.o. Female) Treating RN: Afful, RN, BSN, Velva Harman Primary Care Caitlyn Buchanan: [name] Other Clinician: Referring Uriah Trueba: [name] Treating Alida Greiner/Extender: Frann Rider in Treatment: 26 Active Inactive ` Abuse / Safety / Falls / Self Care Management Nursing Diagnoses: Impaired physical mobility Potential for falls Goals: Patient will remain injury free Date Initiated: 09/06/2016 Target Resolution Date: 12/02/2016 Goal Status: Active Interventions: Assess fall risk on admission and as needed Assess self care needs on admission and as needed Notes: ` Necrotic Tissue Nursing Diagnoses: Impaired tissue integrity related to necrotic/devitalized tissue Goals: Necrotic/devitalized tissue will be minimized in the wound bed Date Initiated: 09/06/2016 Target Resolution Date: 12/02/2016 Goal Status: Active Interventions: Assess patient pain level pre-, during and post procedure and prior to discharge Notes: ` Orientation to the Wound Care  Program Nursing Diagnoses: LAURISSA, COWPER ([id number]) Knowledge deficit related to the wound healing center program Goals: Patient/caregiver will verbalize understanding of the [place] Program Date Initiated: 09/06/2016 Target Resolution Date: 12/02/2016 Goal Status: Active Interventions: Provide education on orientation to the wound center Notes: ` Pressure Nursing Diagnoses: Knowledge deficit related to management of pressures ulcers Potential for impaired tissue integrity related to pressure, friction, moisture, and shear Goals: Patient will remain free of pressure ulcers Date Initiated: 09/06/2016 Target Resolution Date: 12/02/2016 Goal Status: Active Interventions: Assess: immobility, friction, shearing, incontinence upon admission and as needed Notes: ` Soft Tissue Infection Nursing Diagnoses: Impaired tissue integrity Potential for infection: soft tissue Goals: Patient will remain free of wound infection Date Initiated: 09/06/2016 Target Resolution Date: 12/02/2016 Goal Status: Active Interventions: Assess signs and symptoms of infection every visit Notes: ` Wound/Skin Impairment JASMARIE, COPPOCK ([id number]) Nursing Diagnoses: Impaired tissue integrity Goals: Ulcer/skin breakdown will heal within 14 weeks Date Initiated: 09/06/2016 Target Resolution Date: 12/16/2016 Goal Status: Active Interventions: Assess ulceration(s) every visit Notes: Electronic Signature(s) Signed: 03/10/2017 1:50:23 PM By: Regan Lemming BSN, RN Entered By: Regan Lemming on 03/10/2017 11:39:13 [name] ([id number]) -------------------------------------------------------------------------------- Pain Assessment Details Patient Name: [name]. Date of Service: 03/10/2017 11:00 AM Medical Record Number: [id number] Patient Account Number: [id number] Date of Birth/Sex: [birth year] (83 y.o. Female) Treating RN:  Afful, RN, BSN, Velva Harman Primary Care Maddelyn Rocca: [name] Other Clinician: Referring Iokepa Geffre: [name] Treating Karee Christopherson/Extender: Frann Rider in Treatment: 26 Active Problems Location of Pain Severity and Description of Pain Patient Has Paino Yes Site Locations Pain Location: Pain in Ulcers With Dressing Change: Yes Rate the pain. Current Pain Level: 3 Character of Pain Describe the Pain: Tender Pain Management and Medication Current Pain Management: Electronic Signature(s) Signed: 03/10/2017 1:50:23 PM By: Regan Lemming BSN, RN Entered By: Regan Lemming on 03/10/2017 11:24:24 [name] ([id number]) -------------------------------------------------------------------------------- Patient/Caregiver Education Details Patient Name: [name]. Date of Service: 03/10/2017 11:00 AM Medical Record Number: [id number] Patient Account Number: [id number] Date of Birth/Gender: 1931/06/24 (83 y.o. Female) Treating RN: Baruch Gouty, RN, BSN, Velva Harman Primary Care Physician: [name] Other Clinician: Referring Physician: [name] Treating Physician/Extender: Frann Rider in Treatment: 47 Education Assessment Education Provided To: Patient Education Topics Provided Welcome To [facility]: Methods: Explain/Verbal Responses: State content correctly Wound Debridement: Methods: Explain/Verbal Responses: State content correctly Wound/Skin Impairment: Methods: Explain/Verbal Responses: State content correctly Electronic Signature(s) Signed: 03/11/2017 11:47:47 AM By: Regan Lemming BSN, RN Entered By: Regan Lemming on 03/10/2017 13:56:30 [name] ([id number]) -------------------------------------------------------------------------------- Wound Assessment Details Patient Name: [name]. Date of Service: 03/10/2017 11:00 AM Medical Record Number: [id number] Patient Account Number: [id number] Date of Birth/Sex: 31-Aug-1931 (83 y.o. Female) Treating RN: Afful, RN, BSN, [place] Primary Care  Issiah Huffaker: [name] Other Clinician: Referring Dortha Neighbors: [name] Treating Ravenne Wayment/Extender: Frann Rider in Treatment: 26 Wound Status Wound Number: 1 Primary Etiology: Pressure Ulcer Wound Location: Right, Medial Calcaneus Wound Status: Open Wounding Event: Pressure Injury Date Acquired: 07/02/2016 Weeks Of Treatment: 26 Clustered Wound: No Photos Photo Uploaded By: Regan Lemming on 03/10/2017 13:58:22 Wound Measurements Length: (cm) 1 Width: (cm) 2.2 Depth: (cm) 0.3 Area: (cm) 1.728 Volume: (cm) 0.518 % Reduction in Area: 86% % Reduction in Volume: 58.1% Wound Description Classification: Category/Stage III Periwound Skin Texture Texture Color No Abnormalities Noted: No No Abnormalities Noted: No Moisture No Abnormalities Noted: No Treatment Notes Wound #1 (Right, Medial Calcaneus) 1. Cleansed with: [name] ([id number]) Clean wound with Normal Saline 4. Dressing Applied: Aquacel Ag Hydrogel Other dressing (specify in notes) 5. Secondary Dressing Applied Dry Gauze Kerlix/Conform 6. Footwear/Offloading device applied Felt/Foam 7. Secured with Tape Notes heel pads, coban to secure Electronic Signature(s) Signed: 03/10/2017 1:50:23 PM By: Regan Lemming BSN, RN Entered By: Regan Lemming on 03/10/2017 11:34:45 [name] ([id number]) -------------------------------------------------------------------------------- Wound Assessment Details Patient Name: [name]. Date of Service: 03/10/2017 11:00 AM Medical Record Number: [id number] Patient Account Number: [id number] Date of Birth/Sex: Jan 11, 1931 (83 y.o. Female) Treating RN: Afful, RN, BSN, [place] Primary Care Welborn Keena: [name] Other Clinician: Referring Kennadi Albany: [name] Treating Frederica Chrestman/Extender: Frann Rider in Treatment: 26 Wound Status Wound Number: 2 Primary Etiology: Pressure Ulcer Wound Location: Left, Medial Calcaneus Wound Status:  Open Wounding Event: Pressure Injury Date Acquired: 07/02/2016 Weeks Of Treatment: 26 Clustered Wound: No Photos Photo Uploaded By: Regan Lemming on 03/10/2017 13:58:23 Wound Measurements Length: (cm) 4.3 Width: (cm) 4 Depth: (cm) 0.5 Area: (cm) 13.509 Volume: (cm) 6.754 % Reduction in Area: 31.7% % Reduction in Volume: -241.3% Wound Description Classification: Category/Stage III Periwound Skin Texture Texture Color No Abnormalities Noted: No No Abnormalities Noted: No Moisture No Abnormalities Noted: No Treatment Notes Wound #2 (Left, Medial Calcaneus) 1. Cleansed with: [name] ([id number]) Clean wound with Normal Saline 4. Dressing Applied: Aquacel Ag Hydrogel Other dressing (specify  in notes) 5. Secondary Dressing Applied Dry Gauze Kerlix/Conform 6. Footwear/Offloading device applied Felt/Foam 7. Secured with Tape Notes heel pads, coban to secure Electronic Signature(s) Signed: 03/10/2017 1:50:23 PM By: Regan Lemming BSN, RN Entered By: Regan Lemming on 03/10/2017 11:35:48 [name] ([id number]) -------------------------------------------------------------------------------- Vitals Details Patient Name: [name] Date of Service: 03/10/2017 11:00 AM Medical Record Number: [id number] Patient Account Number: [id number] Date of Birth/Sex: 10-May-1931 (83 y.o. Female) Treating RN: Afful, RN, BSN, [place] Primary Care Laverne Klugh: [name] Other Clinician: Referring Kordae Buonocore: [name] Treating Nalla Purdy/Extender: Frann Rider in Treatment: 26 Vital Signs Time Taken: 11:24 Temperature (F): 97.7 Height (in): 63 Pulse (bpm): 58 Weight (lbs): 160 Respiratory Rate (breaths/min): 16 Body Mass Index (BMI): 28.3 Blood Pressure (mmHg): 128/48 Reference Range: 80 - 120 mg / dl Electronic Signature(s) Signed: 03/10/2017 1:50:23 PM By: Regan Lemming BSN, RN Entered By: Regan Lemming on 03/10/2017 11:24:45

## 2017-03-12 NOTE — Progress Notes (Addendum)
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 or erythema. No masses.. Wound #1 status is Open. Original cause of wound was Pressure Injury. The wound is located on the  Right,Medial Calcaneus. The wound measures 1cm length x 2.2cm width x 0.3cm depth; 1.728cm^2 area and 0.518cm^3 volume. Wound #2 status is Open. Original cause of wound was Pressure Injury. The wound is located on the Left,Medial Calcaneus. The wound measures 4.3cm length x 4cm width x 0.5cm depth; 13.509cm^2 area and 6.754cm^3 volume. 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 There was a Skin/Subcutaneous Tissue Debridement ([id number]) debridement with total area of 2.2 sq cm performed by Christin Fudge, MD. with the following instrument(s): Curette to remove  Non-Viable tissue/material including Exudate, Fat Layer (and Subcutaneous Tissue) Exposed, Fibrin/Slough, and Subcutaneous after achieving pain control using Lidocaine 4% Topical Solution. A time out was conducted at 12:03, prior to the start of the procedure. A Minimum amount of bleeding was controlled with Pressure. The procedure was tolerated well with a pain level of 0 throughout and a pain level of 0 following the procedure. Post Debridement Measurements: 1cm length x 2.2cm width x 0.3cm depth; 0.518cm^3 volume. Post debridement Stage noted as Category/Stage III. Character of Wound/Ulcer Post Debridement requires further debridement. Post procedure Diagnosis Wound #1: Same as Pre-Procedure Wound #2 Pre-procedure diagnosis of Wound #2 is a Pressure Ulcer located on the Left,Medial Calcaneus . There was a Skin/Subcutaneous Tissue Debridement ([id number]) debridement with total area of 17.2 sq cm performed by Christin Fudge, MD. with the following instrument(s): Curette to remove Non-Viable tissue/material including Exudate, Fat Layer (and Subcutaneous Tissue) Exposed, Fibrin/Slough, and Subcutaneous after achieving pain control using Lidocaine 4% Topical Solution. A time out was conducted at 12:05, prior to the start of the procedure. A Minimum amount of bleeding was controlled with Pressure. The procedure was tolerated well with a pain level of 0 throughout and a pain level of 0 following the procedure. Post Debridement Measurements: 4.3cm length x 4cm width x 0.5cm depth; 6.754cm^3 volume. Post debridement Stage noted as Category/Stage III. Character of Wound/Ulcer Post Debridement requires further debridement. Post procedure Diagnosis Wound #2: Same as Pre-Procedure Plan [name], [name] ([id number]) Wound Cleansing: Wound #1 Right,Medial Calcaneus: Clean wound with Normal Saline. Wound #2 Left,Medial Calcaneus: Clean wound with Normal Saline. Anesthetic: Wound #1 Right,Medial  Calcaneus: Topical Lidocaine 4% cream applied to wound bed prior to debridement Wound #2 Left,Medial Calcaneus: Topical Lidocaine 4% cream applied to wound bed prior to debridement Skin Barriers/Peri-Wound Care: Wound #1 Right,Medial Calcaneus: Skin Prep Wound #2 Left,Medial Calcaneus: Skin Prep Primary Wound Dressing: Wound #1 Right,Medial Calcaneus: Hydrogel Cutimed Sorbact Wound #2 Left,Medial Calcaneus: Aquacel Ag Secondary Dressing: Wound #1 Right,Medial Calcaneus: ABD and Kerlix/Conform Other - heel cups Wound #2 Left,Medial Calcaneus: ABD and Kerlix/Conform Other - heel cups Dressing Change Frequency: Wound #2 Left,Medial Calcaneus: Change dressing every other day. Three times weekly Follow-up Appointments: Wound #1 Right,Medial Calcaneus: Return Appointment in 1 week. Wound #2 Left,Medial Calcaneus: Return Appointment in 1 week. Edema Control: Wound #1 Right,Medial Calcaneus: Elevate legs to the level of the heart and pump ankles as often as possible Wound #2 Left,Medial Calcaneus: Elevate legs to the level of the heart and pump ankles as often as possible Off-Loading: Wound #1 Right,Medial Calcaneus: Other: - No pressure on heels!!! Float  heel while in chair or bed. Sage boots at night along with floating heels. Wound #2 Left,Medial Calcaneus: Other: - No pressure on heels!!! Float heel while in chair or bed. Sage boots at night along with floating heels. Additional Orders / Instructions: [name], [name] ([id number]) Wound #1 Right,Medial Calcaneus: Increase protein intake. Other: - Vitamins A, C and Zinc Wound #2 Left,Medial Calcaneus: Increase protein intake. Other: - Vitamins A, C and Zinc Home Health: Wound #1 Right,Medial Calcaneus: Continue Home Health Visits - [place] Nurse may visit PRN to address patient s wound care needs. FACE TO FACE ENCOUNTER: MEDICARE and MEDICAID PATIENTS: I certify that this patient is under my care and that  I had a face-to-face encounter that meets the physician face-to-face encounter requirements with this patient on this date. The encounter with the patient was in whole or in part for the following MEDICAL CONDITION: (primary reason for [place]) MEDICAL NECESSITY: I certify, that based on my findings, NURSING services are a medically necessary home health service. HOME BOUND STATUS: I certify that my clinical findings support that this patient is homebound (i.e., Due to illness or injury, pt requires aid of supportive devices such as crutches, cane, wheelchairs, walkers, the use of special transportation or the assistance of another person to leave their place of residence. There is a normal inability to leave the home and doing so requires considerable and taxing effort. Other absences are for medical reasons / religious services and are infrequent or of short duration when for other reasons). If current dressing causes regression in wound condition, may D/C ordered dressing product/s and apply Normal Saline Moist Dressing daily until next [place] / Other MD appointment. [place] of regression in wound condition at [phone]. Please direct any NON-WOUND related issues/requests for orders to patient's Primary Care Physician Wound #2 Left,Medial Calcaneus: [place] Visits - [place] Nurse may visit PRN to address patient s wound care needs. FACE TO FACE ENCOUNTER: MEDICARE and MEDICAID PATIENTS: I certify that this patient is under my care and that I had a face-to-face encounter that meets the physician face-to-face encounter requirements with this patient on this date. The encounter with the patient was in whole or in part for the following MEDICAL CONDITION: (primary reason for [place]) MEDICAL NECESSITY: I certify, that based on my findings, NURSING services are a medically necessary home health service. HOME BOUND  STATUS: I certify that my clinical findings support that this patient is homebound (i.e., Due to illness or injury, pt requires aid of supportive devices such as crutches, cane, wheelchairs, walkers, the use of special transportation or the assistance of another person to leave their place of residence. There is a normal inability to leave the home and doing so requires considerable and taxing effort. Other absences are for medical reasons / religious services and are infrequent or of short duration when for other reasons). If current dressing causes regression in wound condition, may D/C ordered dressing product/s and apply Normal Saline Moist Dressing daily until next [place] / Other MD appointment. [place] of regression in wound condition at [phone]. Please direct any NON-WOUND related issues/requests for orders to patient's Primary Care Physician Medications-please add to medication list.: Wound #1 Right,Medial Calcaneus: P.O. Antibiotics - AUGMENTIN, DOXY Wound #2 Left,Medial Calcaneus: P.O. Antibiotics - AUGMENTIN, DOXY [name], [name]. ([id number]) After review today I have recommended Sorbact with hydrogel to be changed every other day to  the right heel. hopefully next week we will have the skin substitute for application. The left medial heel has deteriorated over the last couple of weeks and after debriding this and reviewing the culture reports she is going to be on doxycycline and Augmentin. Dressing changes to the left heel is with silver alginate on alternate days. Electronic Signature(s) Signed: 03/10/2017 4:22:13 PM By: Christin Fudge MD, FACS Previous Signature: 03/10/2017 12:27:25 PM Version By: Christin Fudge MD, FACS Entered By: Christin Fudge on 03/10/2017 16:22:12 [name] ([id number]) -------------------------------------------------------------------------------- SuperBill Details Patient Name: [name]. Date of Service:  03/10/2017 Medical Record Number: [id number] Patient Account Number: [id number] Date of Birth/Sex: 1931/06/30 (83 y.o. 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, stage 2 Facility Procedures CPT4 Code Description: [id number] 11042 - DEB SUBQ TISSUE 20 SQ CM/< ICD-10 Description Diagnosis L89.620 Pressure ulcer of left heel, unstageable L89.610 Pressure ulcer of right heel, unstageable 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 Modifier: ot with fat la Quantity: 1 yer exposed Physician Procedures CPT4 Code Description: [id number] - WC PHYS LEVEL 3 - EST PT ICD-10 Description Diagnosis L89.620 Pressure ulcer of left heel, unstageable L89.610 Pressure ulcer of right heel, unstageable L97.512 Non-pressure chronic ulcer of other part of right foo Modifier: 25 t with fat lay Quantity: 1 er exposed CPT4 Code Description: [id number] 11042 - WC PHYS SUBQ TISS 20 SQ CM ICD-10 Description Diagnosis L89.620 Pressure ulcer of left heel, unstageable L89.610 Pressure ulcer of right heel, unstageable 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o [name], [name] ([id number]) Modifier: t with fat lay Quantity: 1 er exposed Electronic Signature(s) Signed: 03/10/2017 12:27:51 PM By: Christin Fudge MD, FACS Entered By: Christin Fudge on 03/10/2017 12:27:51

## 2017-03-17 ENCOUNTER — Encounter: Payer: Medicare HMO | Admitting: Surgery

## 2017-03-17 ENCOUNTER — Other Ambulatory Visit: Payer: Self-pay | Admitting: Obstetrics and Gynecology

## 2017-03-17 DIAGNOSIS — L89620 Pressure ulcer of left heel, unstageable: Secondary | ICD-10-CM | POA: Diagnosis not present

## 2017-03-17 DIAGNOSIS — M810 Age-related osteoporosis without current pathological fracture: Secondary | ICD-10-CM

## 2017-03-19 NOTE — Progress Notes (Signed)
ZENNIE, AYARS ([id number]) Visit Report for 03/17/2017 Arrival Information Details Patient Name: [name], [name]. Date of Service: 03/17/2017 11:00 AM Medical Record Number: [id number] Patient Account Number: [id number] Date of Birth/Sex: 1931-07-08 (83 y.o. Female) Treating RN: Afful, RN, BSN, Velva Harman Primary Care Jaylianna Tatlock: [name] Other Clinician: Referring Mickal Meno: [name] Treating Magaline Steinberg/Extender: Frann Rider in Treatment: 27 Visit Information History Since Last Visit All ordered tests and consults were completed: No Patient Arrived: Wheel Chair Added or deleted any medications: No Arrival Time: 11:33 Any new allergies or adverse reactions: No Accompanied By: self Had a fall or experienced change in No activities of daily living that may affect Transfer Assistance: Harrel Lemon Lift risk of falls: Patient Identification Verified: Yes Signs or symptoms of abuse/neglect since last No Secondary Verification Process Yes visito Completed: Hospitalized since last visit: No Patient Requires Transmission-Based No Has Dressing in Place as Prescribed: Yes Precautions: Pain Present Now: No Patient Has Alerts: No Electronic Signature(s) Signed: 03/17/2017 3:19:34 PM By: Regan Lemming BSN, RN Entered By: Regan Lemming on 03/17/2017 11:33:44 [name] ([id number]) -------------------------------------------------------------------------------- Encounter Discharge Information Details Patient Name: [name]. Date of Service: 03/17/2017 11:00 AM Medical Record Number: [id number] Patient Account Number: [id number] Date of Birth/Sex: 03-31-1931 (83 y.o. Female) Treating RN: Afful, RN, BSN, Velva Harman Primary Care Sudie Bandel: [name] Other Clinician: Referring Jaida Basurto: [name] Treating Letonya Mangels/Extender: Frann Rider in Treatment: 27 Encounter Discharge Information Items Discharge Pain Level: 0 Discharge Condition: Stable Ambulatory Status:  Wheelchair Discharge Destination: Home Transportation: Private Auto Accompanied By: self Schedule Follow-up Appointment: No Medication Reconciliation completed and provided to Patient/Care No Anais Denslow: Provided on Clinical Summary of Care: 03/17/2017 Form Type Recipient Paper Patient EB Electronic Signature(s) Signed: 03/17/2017 1:09:00 PM By: Regan Lemming BSN, RN Previous Signature: 03/17/2017 12:29:02 PM Version By: Ruthine Dose Entered By: Regan Lemming on 03/17/2017 13:09:00 [name] ([id number]) -------------------------------------------------------------------------------- Lower Extremity Assessment Details Patient Name: [name]. Date of Service: 03/17/2017 11:00 AM Medical Record Number: [id number] Patient Account Number: [id number] Date of Birth/Sex: 09/30/1931 (83 y.o. Female) Treating RN: Afful, RN, BSN, Velva Harman Primary Care Taray Normoyle: [name] Other Clinician: Referring Averyana Pillars: [name] Treating Matthieu Loftus/Extender: Frann Rider in Treatment: 27 Vascular Assessment Pulses: Dorsalis Pedis Palpable: [Left:Yes] [Right:Yes] Posterior Tibial Extremity colors, hair growth, and conditions: Extremity Color: [Left:Hyperpigmented] [Right:Hyperpigmented] Hair Growth on Extremity: [Left:No] [Right:No] Temperature of Extremity: [Left:Warm] [Right:Warm] Capillary Refill: [Left:< 3 seconds] [Right:< 3 seconds] Toe Nail Assessment Left: Right: Thick: Yes Yes Discolored: Yes Yes Deformed: Yes Yes Improper Length and Hygiene: Yes Yes Electronic Signature(s) Signed: 03/17/2017 3:19:34 PM By: Regan Lemming BSN, RN Entered By: Regan Lemming on 03/17/2017 11:53:39 [name] ([id number]) -------------------------------------------------------------------------------- Multi Wound Chart Details Patient Name: [name]. Date of Service: 03/17/2017 11:00 AM Medical Record Number: [id number] Patient Account Number: [id number] Date of Birth/Sex:  Aug 28, 1931 (83 y.o. Female) Treating RN: Cornell Barman Primary Care Shasta Chinn: [name] Other Clinician: Referring Monigue Spraggins: [name] Treating Eli Pattillo/Extender: Frann Rider in Treatment: 27 Vital Signs Height(in): 63 Pulse(bpm): 59 Weight(lbs): 160 Blood Pressure 107/56 (mmHg): Body Mass Index(BMI): 28 Temperature(F): 97.8 Respiratory Rate 16 (breaths/min): Photos: [1:No Photos] [2:No Photos] [N/A:N/A] Wound Location: [1:Right Calcaneus - Medial] [2:Left Calcaneus - Medial] [N/A:N/A] Wounding Event: [1:Pressure Injury] [2:Pressure Injury] [N/A:N/A] Primary Etiology: [1:Pressure Ulcer] [2:Pressure Ulcer] [N/A:N/A] Comorbid History: [1:Cataracts, Hypertension] [2:Cataracts, Hypertension] [N/A:N/A] Date Acquired: [1:07/02/2016] [2:07/02/2016] [N/A:N/A] Weeks of Treatment: [1:27] [2:27] [N/A:N/A] Wound Status: [1:Open] [2:Open] [N/A:N/A] Measurements L x W x D 1x1.5x0.2 [2:4.2x4.3x0.4] [N/A:N/A] (  cm) Area (cm) : [1:1.178] [2:14.184] [N/A:N/A] Volume (cm) : [1:0.236] [2:5.674] [N/A:N/A] % Reduction in Area: [1:90.50%] [2:28.30%] [N/A:N/A] % Reduction in Volume: 80.90% [2:-186.70%] [N/A:N/A] Classification: [1:Category/Stage III] [2:Category/Stage III] [N/A:N/A] Exudate Amount: [1:Large] [2:Large] [N/A:N/A] Exudate Type: [1:Serosanguineous] [2:Serosanguineous] [N/A:N/A] Exudate Color: [1:red, brown] [2:red, brown] [N/A:N/A] Wound Margin: [1:Indistinct, nonvisible] [2:Indistinct, nonvisible] [N/A:N/A] Granulation Amount: [1:Medium (34-66%)] [2:Medium (34-66%)] [N/A:N/A] Granulation Quality: [1:Pink, Pale] [2:Pink, Pale] [N/A:N/A] Necrotic Amount: [1:Small (1-33%)] [2:Medium (34-66%)] [N/A:N/A] Exposed Structures: [1:Fat Layer (Subcutaneous Tissue) Exposed: Yes Fascia: No Tendon: No Muscle: No Joint: No Bone: No] [2:Fat Layer (Subcutaneous Tissue) Exposed: Yes Fascia: No Tendon: No Muscle: No Joint: No Bone: No] [N/A:N/A] Epithelialization: [1:None] [2:None]  [N/A:N/A] Debridement: N/A Debridement ([id number]) Pre-procedure N/A 12:08 N/A Verification/Time Out Taken: Pain Control: N/A Other N/A Tissue Debrided: N/A Subcutaneous N/A Level: N/A Skin/Subcutaneous N/A Tissue Debridement Area (sq N/A 18.06 N/A cm): Instrument: N/A Forceps, Scissors N/A Bleeding: N/A Moderate N/A Hemostasis Achieved: N/A Pressure N/A Procedural Pain: N/A 2 N/A Post Procedural Pain: N/A 2 N/A Debridement Treatment N/A Procedure was tolerated N/A Response: well Post Debridement N/A 4.2x4.3x0.4 N/A Measurements L x W x D (cm) Post Debridement N/A 5.674 N/A Volume: (cm) Post Debridement N/A Category/Stage III N/A Stage: Periwound Skin Texture: Excoriation: No Excoriation: No N/A Induration: No Induration: No Callus: No Callus: No Crepitus: No Crepitus: No Rash: No Rash: No Scarring: No Scarring: No Periwound Skin Maceration: No Maceration: No N/A Moisture: Dry/Scaly: No Dry/Scaly: No Periwound Skin Color: Atrophie Blanche: No Atrophie Blanche: No N/A Cyanosis: No Cyanosis: No Ecchymosis: No Ecchymosis: No Erythema: No Erythema: No Hemosiderin Staining: No Hemosiderin Staining: No Mottled: No Mottled: No Pallor: No Pallor: No Rubor: No Rubor: No Temperature: No Abnormality No Abnormality N/A Tenderness on Yes No N/A Palpation: Wound Preparation: Ulcer Cleansing: Ulcer Cleansing: N/A Rinsed/Irrigated with Rinsed/Irrigated with Saline Saline Topical Anesthetic Topical Anesthetic SAHIAN, KERNEY ([id number]) Applied: Other: lidocaine Applied: Other: lidocaine 4% 4% Procedures Performed: Cellular or Tissue Based Debridement N/A Product Treatment Notes Electronic Signature(s) Signed: 03/17/2017 12:21:07 PM By: Christin Fudge MD, FACS Entered By: Christin Fudge on 03/17/2017 12:21:07 [name] ([id number]) -------------------------------------------------------------------------------- [place]  Details Patient Name: [name] Date of Service: 03/17/2017 11:00 AM Medical Record Number: [id number] Patient Account Number: [id number] Date of Birth/Sex: 1931/01/08 (83 y.o. Female) Treating RN: Afful, RN, BSN, Velva Harman Primary Care Jaretzy Lhommedieu: [name] Other Clinician: Referring Lemont Sitzmann: [name] Treating Keane Martelli/Extender: Frann Rider in Treatment: 27 Active Inactive ` Abuse / Safety / Falls / Self Care Management Nursing Diagnoses: Impaired physical mobility Potential for falls Goals: Patient will remain injury free Date Initiated: 09/06/2016 Target Resolution Date: 12/02/2016 Goal Status: Active Interventions: Assess fall risk on admission and as needed Assess self care needs on admission and as needed Notes: ` Necrotic Tissue Nursing Diagnoses: Impaired tissue integrity related to necrotic/devitalized tissue Goals: Necrotic/devitalized tissue will be minimized in the wound bed Date Initiated: 09/06/2016 Target Resolution Date: 12/02/2016 Goal Status: Active Interventions: Assess patient pain level pre-, during and post procedure and prior to discharge Notes: ` Orientation to the Wound Care Program Nursing Diagnoses: JEANELLE, DAKE ([id number]) Knowledge deficit related to the wound healing center program Goals: Patient/caregiver will verbalize understanding of the [place] Program Date Initiated: 09/06/2016 Target Resolution Date: 12/02/2016 Goal Status: Active Interventions: Provide education on orientation to the wound center Notes: ` Pressure Nursing Diagnoses: Knowledge deficit related to management of pressures ulcers Potential for impaired tissue integrity related to pressure, friction,  moisture, and shear Goals: Patient will remain free of pressure ulcers Date Initiated: 09/06/2016 Target Resolution Date: 12/02/2016 Goal Status: Active Interventions: Assess: immobility, friction, shearing, incontinence upon  admission and as needed Notes: ` Soft Tissue Infection Nursing Diagnoses: Impaired tissue integrity Potential for infection: soft tissue Goals: Patient will remain free of wound infection Date Initiated: 09/06/2016 Target Resolution Date: 12/02/2016 Goal Status: Active Interventions: Assess signs and symptoms of infection every visit Notes: ` Wound/Skin Impairment AYSE, MCCARTIN ([id number]) Nursing Diagnoses: Impaired tissue integrity Goals: Ulcer/skin breakdown will heal within 14 weeks Date Initiated: 09/06/2016 Target Resolution Date: 12/16/2016 Goal Status: Active Interventions: Assess ulceration(s) every visit Notes: Electronic Signature(s) Signed: 03/17/2017 3:19:34 PM By: Regan Lemming BSN, RN Signed: 03/17/2017 5:08:07 PM By: Gretta Cool, BSN, RN, CWS, Kim RN, BSN Entered By: Gretta Cool, BSN, RN, CWS, Kim on 03/17/2017 12:05:56 [name] ([id number]) -------------------------------------------------------------------------------- Pain Assessment Details Patient Name: [name]. Date of Service: 03/17/2017 11:00 AM Medical Record Number: [id number] Patient Account Number: [id number] Date of Birth/Sex: 15-Nov-1930 (83 y.o. Female) Treating RN: Afful, RN, BSN, Velva Harman Primary Care Dalaya Suppa: [name] Other Clinician: Referring Sheilia Reznick: [name] Treating Martinez Boxx/Extender: Frann Rider in Treatment: 27 Active Problems Location of Pain Severity and Description of Pain Patient Has Paino No Site Locations With Dressing Change: No Pain Management and Medication Current Pain Management: Electronic Signature(s) Signed: 03/17/2017 3:19:34 PM By: Regan Lemming BSN, RN Entered By: Regan Lemming on 03/17/2017 11:33:55 [name] ([id number]) -------------------------------------------------------------------------------- Patient/Caregiver Education Details Patient Name: [name]. Date of Service: 03/17/2017 11:00 AM Medical Record Number:  [id number] Patient Account Number: [id number] Date of Birth/Gender: January 21, 1931 (83 y.o. Female) Treating RN: Baruch Gouty, RN, BSN, Velva Harman Primary Care Physician: [name] Other Clinician: Referring Physician: [name] Treating Physician/Extender: Frann Rider in Treatment: 41 Education Assessment Education Provided To: Patient home health Education Topics Provided Welcome To [facility]: Methods: Explain/Verbal Responses: Reinforcements needed Wound Debridement: Methods: Explain/Verbal Responses: Reinforcements needed Wound/Skin Impairment: Methods: Explain/Verbal Responses: Reinforcements needed Electronic Signature(s) Signed: 03/17/2017 3:19:34 PM By: Regan Lemming BSN, RN Entered By: Regan Lemming on 03/17/2017 13:09:38 [name] ([id number]) -------------------------------------------------------------------------------- Wound Assessment Details Patient Name: [name]. Date of Service: 03/17/2017 11:00 AM Medical Record Number: [id number] Patient Account Number: [id number] Date of Birth/Sex: December 16, 1930 (83 y.o. Female) Treating RN: Afful, RN, BSN, [place] Primary Care Marios Gaiser: [name] Other Clinician: Referring Vishal Sandlin: [name] Treating Aliyha Fornes/Extender: Frann Rider in Treatment: 27 Wound Status Wound Number: 1 Primary Etiology: Pressure Ulcer Wound Location: Right Calcaneus - Medial Wound Status: Open Wounding Event: Pressure Injury Comorbid History: Cataracts, Hypertension Date Acquired: 07/02/2016 Weeks Of Treatment: 27 Clustered Wound: No Photos Photo Uploaded By: Regan Lemming on 03/17/2017 15:15:17 Wound Measurements Length: (cm) 1 Width: (cm) 1.5 Depth: (cm) 0.2 Area: (cm) 1.178 Volume: (cm) 0.236 % Reduction in Area: 90.5% % Reduction in Volume: 80.9% Epithelialization: None Tunneling: No Undermining: No Wound Description Classification: Category/Stage III Wound Margin: Indistinct,  nonvisible Exudate Amount: Large Exudate Type: Serosanguineous Exudate Color: red, brown Foul Odor After Cleansing: No Slough/Fibrino Yes Wound Bed Granulation Amount: Medium (34-66%) Exposed Structure Granulation Quality: Pink, Pale Fascia Exposed: No Necrotic Amount: Small (1-33%) Fat Layer (Subcutaneous Tissue) Exposed: Yes Necrotic Quality: Adherent Slough Tendon Exposed: No [place], [place] S. ([id number]) Muscle Exposed: No Joint Exposed: No Bone Exposed: No Periwound Skin Texture Texture Color No Abnormalities Noted: No No Abnormalities Noted: No Callus: No Atrophie Blanche: No Crepitus: No Cyanosis: No Excoriation: No Ecchymosis: No Induration: No  Erythema: No Rash: No Hemosiderin Staining: No Scarring: No Mottled: No Pallor: No Moisture Rubor: No No Abnormalities Noted: No Dry / Scaly: No Temperature / Pain Maceration: No Temperature: No Abnormality Tenderness on Palpation: Yes Wound Preparation Ulcer Cleansing: Rinsed/Irrigated with Saline Topical Anesthetic Applied: Other: lidocaine 4%, Treatment Notes Wound #1 (Right, Medial Calcaneus) 1. Cleansed with: Clean wound with Normal Saline Cleanse wound with antibacterial soap and water 4. Dressing Applied: Santyl Ointment Other dressing (specify in notes) 5. Secondary Dressing Applied ABD Pad Kerlix/Conform 7. Secured with Tape Other (specify in notes) Notes Afinnity to right calcaneous, santly to left calcaneous in the clinic, secured with coban Electronic Signature(s) Signed: 03/17/2017 3:19:34 PM By: Regan Lemming BSN, RN Entered By: Regan Lemming on 03/17/2017 11:52:31 [name] ([id number]) -------------------------------------------------------------------------------- Wound Assessment Details Patient Name: [name]. Date of Service: 03/17/2017 11:00 AM Medical Record Number: [id number] Patient Account Number: [id number] Date of Birth/Sex: Jul 28, 1931 (83 y.o. Female) Treating RN:  Afful, RN, BSN, [place] Primary Care Jozy Mcphearson: [name] Other Clinician: Referring Juliane Guest: [name] Treating Vallie Teters/Extender: Frann Rider in Treatment: 27 Wound Status Wound Number: 2 Primary Etiology: Pressure Ulcer Wound Location: Left Calcaneus - Medial Wound Status: Open Wounding Event: Pressure Injury Comorbid History: Cataracts, Hypertension Date Acquired: 07/02/2016 Weeks Of Treatment: 27 Clustered Wound: No Photos Photo Uploaded By: Regan Lemming on 03/17/2017 15:15:18 Wound Measurements Length: (cm) 4.2 Width: (cm) 4.3 Depth: (cm) 0.4 Area: (cm) 14.184 Volume: (cm) 5.674 % Reduction in Area: 28.3% % Reduction in Volume: -186.7% Epithelialization: None Tunneling: No Undermining: No Wound Description Classification: Category/Stage III Wound Margin: Indistinct, nonvisible Exudate Amount: Large Exudate Type: Serosanguineous Exudate Color: red, brown Foul Odor After Cleansing: No Slough/Fibrino Yes Wound Bed Granulation Amount: Medium (34-66%) Exposed Structure Granulation Quality: Pink, Pale Fascia Exposed: No Necrotic Amount: Medium (34-66%) Fat Layer (Subcutaneous Tissue) Exposed: Yes Necrotic Quality: Adherent Slough Tendon Exposed: No Soh, [place] S. ([id number]) Muscle Exposed: No Joint Exposed: No Bone Exposed: No Periwound Skin Texture Texture Color No Abnormalities Noted: No No Abnormalities Noted: No Callus: No Atrophie Blanche: No Crepitus: No Cyanosis: No Excoriation: No Ecchymosis: No Induration: No Erythema: No Rash: No Hemosiderin Staining: No Scarring: No Mottled: No Pallor: No Moisture Rubor: No No Abnormalities Noted: No Dry / Scaly: No Temperature / Pain Maceration: No Temperature: No Abnormality Wound Preparation Ulcer Cleansing: Rinsed/Irrigated with Saline Topical Anesthetic Applied: Other: lidocaine 4%, Treatment Notes Wound #2 (Left, Medial Calcaneus) 1. Cleansed with: Clean wound  with Normal Saline Cleanse wound with antibacterial soap and water 4. Dressing Applied: Santyl Ointment Other dressing (specify in notes) 5. Secondary Dressing Applied ABD Pad Kerlix/Conform 7. Secured with Tape Other (specify in notes) Notes Afinnity to right calcaneous, santly to left calcaneous in the clinic, secured with coban Electronic Signature(s) Signed: 03/17/2017 3:19:34 PM By: Regan Lemming BSN, RN Entered By: Regan Lemming on 03/17/2017 11:53:15 [name] ([id number]) -------------------------------------------------------------------------------- Vitals Details Patient Name: [name]. Date of Service: 03/17/2017 11:00 AM Medical Record Number: [id number] Patient Account Number: [id number] Date of Birth/Sex: 01/12/1931 (83 y.o. Female) Treating RN: Afful, RN, BSN, [place] Primary Care Amy Gothard: [name] Other Clinician: Referring Diane Mochizuki: [name] Treating [place] Cassetta/Extender: Frann Rider in Treatment: 27 Vital Signs Time Taken: 11:33 Temperature (F): 97.8 Height (in): 63 Pulse (bpm): 59 Weight (lbs): 160 Respiratory Rate (breaths/min): 16 Body Mass Index (BMI): 28.3 Blood Pressure (mmHg): 107/56 Reference Range: 80 - 120 mg / dl Electronic Signature(s) Signed: 03/17/2017 3:19:34 PM By: Regan Lemming BSN, RN Entered  ByRegan Lemming on 03/17/2017 11:34:22

## 2017-03-19 NOTE — Progress Notes (Signed)
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 left medial calcaneus wound was sharply debrided of some of the necrotic debris and it does not probe down to bone Integumentary (Hair, Skin) No suspicious lesions. No crepitus or fluctuance. No peri-wound warmth or erythema. No masses.. Wound #1 status is Open. Original cause of wound was Pressure Injury. The wound is located on the Right,Medial Calcaneus. The wound measures 1cm length x 1.5cm width x 0.2cm depth; 1.178cm^2 area and 0.236cm^3 volume. There is Fat Layer (Subcutaneous Tissue) Exposed exposed. There is no tunneling or undermining noted. There is a large amount of serosanguineous drainage noted. The wound margin is indistinct and nonvisible. There is medium (34-66%) pink, pale granulation within the wound bed. There is a small (1-33%) amount of necrotic tissue within the wound bed including Adherent Slough. The periwound skin appearance did not exhibit: Callus, Crepitus, Excoriation, Induration, Rash, Scarring, Dry/Scaly, Maceration, Atrophie Blanche, Cyanosis, Ecchymosis, Hemosiderin Staining, Mottled, Pallor, Rubor, Erythema. Periwound  temperature was noted as No Abnormality. The periwound has tenderness on palpation. Wound #2 status is Open. Original cause of wound was Pressure Injury. The wound is located on the Left,Medial Calcaneus. The wound measures 4.2cm length x 4.3cm width x 0.4cm depth; 14.184cm^2 area [name], Danielle S. ([id number]) and 5.674cm^3 volume. There is Fat Layer (Subcutaneous Tissue) Exposed exposed. There is no tunneling or undermining noted. There is a large amount of serosanguineous drainage noted. The wound margin is indistinct and nonvisible. There is medium (34-66%) pink, pale granulation within the wound bed. There is a medium (34-66%) amount of necrotic tissue within the wound bed including Adherent Slough. The periwound skin appearance did not exhibit: Callus, Crepitus, Excoriation, Induration, Rash, Scarring, Dry/Scaly, Maceration, Atrophie Blanche, Cyanosis, Ecchymosis, Hemosiderin Staining, Mottled, Pallor, Rubor, Erythema. Periwound temperature was noted as No Abnormality. 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 There was a Skin/Subcutaneous Tissue Debridement ([id number]) debridement with total area of 18.06 sq cm performed by Christin Fudge, MD. with the following instrument(s): Forceps and Scissors to remove Viable and Non-Viable tissue/material including Subcutaneous after achieving pain control using Other (lidocaine 4%). A time out was conducted at 12:08, prior to the start of the procedure. A Moderate amount of bleeding was controlled with Pressure. The procedure was tolerated well with a pain level of 2  throughout and a pain level of 2 following the procedure. Post Debridement Measurements: 4.2cm length x 4.3cm width x 0.4cm depth; 5.674cm^3 volume. Post debridement Stage noted as Category/Stage III. Character of Wound/Ulcer Post Debridement requires further debridement. Post procedure Diagnosis Wound #2: Same as Pre-Procedure Wound #1 Pre-procedure diagnosis of Wound #1 is a Pressure Ulcer located on the Right,Medial Calcaneus. A skin graft procedure using a bioengineered skin substitute/cellular or tissue based product was performed by Christin Fudge, MD. Other was applied and secured with Steri-Strips. 3 sq cm of product was utilized and 0 sq cm was wasted. Post Application, mepitel was applied. A Time Out was conducted at 12:00, prior to the start of the procedure. The procedure was tolerated well with a pain level of 2 throughout and a pain level of 2 following the procedure. Post procedure Diagnosis Wound #1: Same as Pre-Procedure [name],  [name] ([id number]) General Notes: Affinity 1.5 x 1.5 applied to wound. Donated product. No charge to patient. Plan Wound Cleansing: Wound #1 Right,Medial Calcaneus: Clean wound with Normal Saline. Wound #2 Left,Medial Calcaneus: Clean wound with Normal Saline. Anesthetic: Wound #1 Right,Medial Calcaneus: Topical Lidocaine 4% cream applied to wound bed prior to debridement Wound #2 Left,Medial Calcaneus: Topical Lidocaine 4% cream applied to wound bed prior to debridement Skin Barriers/Peri-Wound Care: Wound #1 Right,Medial Calcaneus: Skin Prep Wound #2 Left,Medial Calcaneus: Skin Prep Primary Wound Dressing: Wound #1 Right,Medial Calcaneus: Other: - Donated: Affinity 1.5 x 1.5 application in clinic; including contact layer, fixation with steri strips, dry gauze and cover dressing. Wound #2 Left,Medial Calcaneus: Santyl Ointment Secondary Dressing: Wound #1 Right,Medial Calcaneus: ABD and Kerlix/Conform Wound #2 Left,Medial  Calcaneus: ABD and Kerlix/Conform Dressing Change Frequency: Wound #2 Left,Medial Calcaneus: Three times weekly Wound #1 Right,Medial Calcaneus: Other: - Affinity applied in clinic. Do no change unless drainage comes through. Only change down to steri-strips. Follow-up Appointments: Wound #1 Right,Medial Calcaneus: Return Appointment in 1 week. Wound #2 Left,Medial Calcaneus: Return Appointment in 1 week. Edema Control: Wound #1 Right,Medial Calcaneus: Elevate legs to the level of the heart and pump ankles as often as possible Wound #2 Left,Medial Calcaneus: Elevate legs to the level of the heart and pump ankles as often as possible [name], Danielle S. ([id number]) Off-Loading: Wound #1 Right,Medial Calcaneus: Other: - No pressure on heels!!! Float heel while in chair or bed. Sage boots at night along with floating heels. Wound #2 Left,Medial Calcaneus: Other: - No pressure on heels!!! Float heel while in chair or bed. Sage boots at night along with floating heels. Additional Orders / Instructions: Wound #1 Right,Medial Calcaneus: Increase protein intake. Other: - Vitamins A, C and Zinc Wound #2 Left,Medial Calcaneus: Increase protein intake. Other: - Vitamins A, C and Zinc Home Health: Wound #1 Right,Medial Calcaneus: Continue Home Health Visits - [place] Nurse may visit PRN to address patient s wound care needs. FACE TO FACE ENCOUNTER: MEDICARE and MEDICAID PATIENTS: I certify that this patient is under my care and that I had a face-to-face encounter that meets the physician face-to-face encounter requirements with this patient on this date. The encounter with the patient was in whole or in part for the following MEDICAL CONDITION: (primary reason for [place]) MEDICAL NECESSITY: I certify, that based on my findings, NURSING services are a medically necessary home health service. HOME BOUND STATUS: I certify that my clinical findings support that this  patient is homebound (i.e., Due to illness or injury, pt requires aid of supportive devices such as crutches, cane, wheelchairs, walkers, the use of special transportation or the assistance of another person to leave their place of residence. There is a normal inability to leave the home and doing so requires considerable and taxing effort. Other absences are for medical reasons / religious services and are infrequent or of short duration when for other reasons). If current dressing causes regression in wound condition, may D/C ordered dressing product/s and apply Normal Saline Moist Dressing daily until next [place] / Other MD appointment. [place] of regression in wound condition at [phone]. Please direct any NON-WOUND related issues/requests for orders to patient's Primary Care Physician Wound #2 Left,Medial Calcaneus: [place] Visits - [place] Nurse may visit PRN to address patient s wound care needs. FACE TO FACE ENCOUNTER: MEDICARE and MEDICAID PATIENTS: I certify that this patient is under my care and that  I had a face-to-face encounter that meets the physician face-to-face encounter requirements with this patient on this date. The encounter with the patient was in whole or in part for the following MEDICAL CONDITION: (primary reason for [place]) MEDICAL NECESSITY: I certify, that based on my findings, NURSING services are a medically necessary home health service. HOME BOUND STATUS: I certify that my clinical findings support that this patient is homebound (i.e., Due to illness or injury, pt requires aid of supportive devices such as crutches, cane, wheelchairs, walkers, the use of special transportation or the assistance of another person to leave their place of residence. There is a normal inability to leave the home and doing so requires considerable and taxing effort. Other absences are for medical reasons /  religious services and are infrequent or of short duration when for other reasons). If current dressing causes regression in wound condition, may D/C ordered dressing product/s and apply Normal Saline Moist Dressing daily until next [place] / Other MD appointment. [place] of regression in wound condition at [phone]. Please direct any NON-WOUND related issues/requests for orders to patient's Primary Care Physician Medications-please add to medication list.: Wound #1 Right,Medial Calcaneus: [name], [name]. ([id number]) P.O. Antibiotics - continue AUGMENTIN, DOXY Wound #2 Left,Medial Calcaneus: P.O. Antibiotics - continue AUGMENTIN, DOXY This left foot is doing better today I have asked her to continue with her oral antibiotics and local dressing. The right foot has looked very good and she is had a vendor supplied skin substitute applied today. We will try and work with the home health nurses to make sure dressings appropriately changed Electronic Signature(s) Signed: 03/17/2017 4:36:58 PM By: Christin Fudge MD, FACS Previous Signature: 03/17/2017 4:36:47 PM Version By: Christin Fudge MD, FACS Previous Signature: 03/17/2017 12:27:10 PM Version By: Christin Fudge MD, FACS Entered By: Christin Fudge on 03/17/2017 16:36:57 [name] ([id number]) -------------------------------------------------------------------------------- SuperBill Details Patient Name: [name]. Date of Service: 03/17/2017 Medical Record Number: [id number] Patient Account Number: [id number] Date of Birth/Sex: 01-May-1931 (83 y.o. 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, stage 2 Facility Procedures CPT4 Code Description: [id number] 11042 - DEB SUBQ TISSUE 20 SQ CM/< ICD-10 Description Diagnosis L89.620 Pressure ulcer of left heel, unstageable L89.610 Pressure ulcer of right heel, unstageable 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 Modifier: ot with fat la Quantity: 1 yer exposed Physician Procedures CPT4 Code Description: [id number] 11042 - WC PHYS SUBQ TISS 20 SQ CM ICD-10 Description Diagnosis L89.620 Pressure ulcer of left heel, unstageable L89.610 Pressure ulcer of right heel, unstageable 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ot Modifier: with fat lay Quantity: 1 er exposed CPT4 Code Description: [id number] 15275 - WC PHYS SKIN SUB GRAFT FACE/NK/HF/G ICD-10 Description Diagnosis L89.610 Pressure ulcer of right heel, unstageable Modifier: Quantity: 1 Notes Donated Affinity 1.5 x 1.5 applied in clinic. No charge to patient. [name], [name] ([id number]) Electronic Signature(s) Signed: 03/18/2017 4:12:51 PM By: Christin Fudge MD, FACS Previous Signature: 03/17/2017 53:97:67 PM Version By: Christin Fudge MD, FACS Previous Signature: 03/17/2017 12:27:52 PM Version By: Christin Fudge MD, FACS Entered  BySharon Mt on 03/18/2017 10:39:08

## 2017-03-24 ENCOUNTER — Encounter: Payer: Medicare HMO | Attending: Surgery | Admitting: Surgery

## 2017-03-24 DIAGNOSIS — L89322 Pressure ulcer of left buttock, stage 2: Secondary | ICD-10-CM | POA: Diagnosis not present

## 2017-03-24 DIAGNOSIS — I129 Hypertensive chronic kidney disease with stage 1 through stage 4 chronic kidney disease, or unspecified chronic kidney disease: Secondary | ICD-10-CM | POA: Diagnosis not present

## 2017-03-24 DIAGNOSIS — N189 Chronic kidney disease, unspecified: Secondary | ICD-10-CM | POA: Insufficient documentation

## 2017-03-24 DIAGNOSIS — L97512 Non-pressure chronic ulcer of other part of right foot with fat layer exposed: Secondary | ICD-10-CM | POA: Insufficient documentation

## 2017-03-24 DIAGNOSIS — D649 Anemia, unspecified: Secondary | ICD-10-CM | POA: Diagnosis not present

## 2017-03-24 DIAGNOSIS — L89620 Pressure ulcer of left heel, unstageable: Secondary | ICD-10-CM | POA: Insufficient documentation

## 2017-03-24 DIAGNOSIS — Z993 Dependence on wheelchair: Secondary | ICD-10-CM | POA: Diagnosis not present

## 2017-03-24 DIAGNOSIS — L89610 Pressure ulcer of right heel, unstageable: Secondary | ICD-10-CM | POA: Diagnosis not present

## 2017-03-25 NOTE — Progress Notes (Signed)
ASLYN, COTTMAN ([id number]) Visit Report for 03/24/2017 Arrival Information Details Patient Name: [name], [name]. Date of Service: 03/24/2017 3:00 PM Medical Record Number: [id number] Patient Account Number: [id number] Date of Birth/Sex: 1931-03-25 (83 y.o. Female) Treating RN: Afful, RN, BSN, Velva Harman Primary Care Tykeshia Tourangeau: [name] Other Clinician: Referring Shakirah Kirkey: [name] Treating Alexianna Nachreiner/Extender: Frann Rider in Treatment: 28 Visit Information History Since Last Visit All ordered tests and consults were completed: No Patient Arrived: Wheel Chair Added or deleted any medications: No Arrival Time: 15:04 Any new allergies or adverse reactions: No Accompanied By: self Had a fall or experienced change in No activities of daily living that may affect Transfer Assistance: Harrel Lemon Lift risk of falls: Patient Identification Verified: Yes Signs or symptoms of abuse/neglect since last No Secondary Verification Process Yes visito Completed: Has Dressing in Place as Prescribed: Yes Patient Requires Transmission-Based No Pain Present Now: No Precautions: Patient Has Alerts: No Electronic Signature(s) Signed: 03/24/2017 4:19:01 PM By: Regan Lemming BSN, RN Entered By: Regan Lemming on 03/24/2017 15:04:47 [name] ([id number]) -------------------------------------------------------------------------------- Encounter Discharge Information Details Patient Name: [name]. Date of Service: 03/24/2017 3:00 PM Medical Record Number: [id number] Patient Account Number: [id number] Date of Birth/Sex: 1931-07-21 (83 y.o. Female) Treating RN: Afful, RN, BSN, Velva Harman Primary Care Sabrinia Prien: [name] Other Clinician: Referring Kyleah Pensabene: [name] Treating Biridiana Twardowski/Extender: Frann Rider in Treatment: 28 Encounter Discharge Information Items Discharge Pain Level: 0 Discharge Condition: Stable Ambulatory Status: Wheelchair Discharge Destination:  Home Transportation: Private Auto Accompanied By: self Schedule Follow-up Appointment: No Medication Reconciliation completed No and provided to Patient/Care Audie Wieser: Provided on Clinical Summary of Care: 03/24/2017 Form Type Recipient Paper Patient EB Electronic Signature(s) Signed: 03/24/2017 4:01:07 PM By: Regan Lemming BSN, RN Previous Signature: 03/24/2017 3:45:08 PM Version By: Sharon Mt Entered By: Regan Lemming on 03/24/2017 16:01:07 [name] ([id number]) -------------------------------------------------------------------------------- Lower Extremity Assessment Details Patient Name: [name]. Date of Service: 03/24/2017 3:00 PM Medical Record Number: [id number] Patient Account Number: [id number] Date of Birth/Sex: 11-17-1930 (83 y.o. Female) Treating RN: Afful, RN, BSN, Velva Harman Primary Care Jenniffer Vessels: [name] Other Clinician: Referring Galvin Aversa: [name] Treating Darly Massi/Extender: Frann Rider in Treatment: 28 Vascular Assessment Pulses: Dorsalis Pedis Palpable: [Left:Yes] [Right:Yes] Posterior Tibial Extremity colors, hair growth, and conditions: Extremity Color: [Left:Mottled] [Right:Mottled] Hair Growth on Extremity: [Left:No] [Right:No] Temperature of Extremity: [Left:Warm] [Right:Warm] Capillary Refill: [Left:< 3 seconds] [Right:< 3 seconds] Toe Nail Assessment Left: Right: Thick: Yes Yes Discolored: Yes Yes Deformed: Yes Yes Improper Length and Hygiene: Yes Yes Electronic Signature(s) Signed: 03/24/2017 4:19:01 PM By: Regan Lemming BSN, RN Entered By: Regan Lemming on 03/24/2017 15:17:36 [name] ([id number]) -------------------------------------------------------------------------------- Multi Wound Chart Details Patient Name: [name]. Date of Service: 03/24/2017 3:00 PM Medical Record Number: [id number] Patient Account Number: [id number] Date of Birth/Sex: Nov 11, 1930 (83 y.o. Female) Treating RN: Baruch Gouty, RN, BSN,  Velva Harman Primary Care Sherri Mcarthy: [name] Other Clinician: Referring Lenni Reckner: [name] Treating Anthonio Mizzell/Extender: Frann Rider in Treatment: 28 Vital Signs Height(in): 63 Pulse(bpm): 68 Weight(lbs): 160 Blood Pressure 118/80 (mmHg): Body Mass Index(BMI): 28 Temperature(F): 98.0 Respiratory Rate 17 (breaths/min): Photos: [1:No Photos] [2:No Photos] [N/A:N/A] Wound Location: [1:Right Calcaneus - Medial] [2:Left Calcaneus - Medial] [N/A:N/A] Wounding Event: [1:Pressure Injury] [2:Pressure Injury] [N/A:N/A] Primary Etiology: [1:Pressure Ulcer] [2:Pressure Ulcer] [N/A:N/A] Comorbid History: [1:Cataracts, Hypertension] [2:Cataracts, Hypertension] [N/A:N/A] Date Acquired: [1:07/02/2016] [2:07/02/2016] [N/A:N/A] Weeks of Treatment: [1:28] [2:28] [N/A:N/A] Wound Status: [1:Open] [2:Open] [N/A:N/A] Measurements L x W x D 1x1.5x0.2 [2:4.1x3.2x0.4] [N/A:N/A] (cm) Area (cm) : [  1:1.178] [2:10.304] [N/A:N/A] Volume (cm) : [1:0.236] [2:4.122] [N/A:N/A] % Reduction in Area: [1:90.50%] [2:47.90%] [N/A:N/A] % Reduction in Volume: 80.90% [2:-108.30%] [N/A:N/A] Classification: [1:Category/Stage III] [2:Category/Stage III] [N/A:N/A] Exudate Amount: [1:Large] [2:Large] [N/A:N/A] Exudate Type: [1:Serosanguineous] [2:Serosanguineous] [N/A:N/A] Exudate Color: [1:red, brown] [2:red, brown] [N/A:N/A] Foul Odor After [1:No] [2:Yes] [N/A:N/A] Cleansing: Odor Anticipated Due to N/A [2:No] [N/A:N/A] Product Use: Wound Margin: [1:Indistinct, nonvisible] [2:Indistinct, nonvisible] [N/A:N/A] Granulation Amount: [1:Small (1-33%)] [2:Small (1-33%)] [N/A:N/A] Granulation Quality: [1:Pink, Pale] [2:Pink, Pale] [N/A:N/A] Necrotic Amount: [1:Large (67-100%)] [2:Large (67-100%)] [N/A:N/A] Exposed Structures: [1:Fat Layer (Subcutaneous Tissue) Exposed: Yes Fascia: No Tendon: No] [2:Fat Layer (Subcutaneous Tissue) Exposed: Yes Fascia: No Tendon: No] [N/A:N/A] Muscle: No Muscle: No Joint:  No Joint: No Bone: No Bone: No Epithelialization: Small (1-33%) None N/A Debridement: N/A Debridement ([id number]/A [id number]) Pre-procedure N/A 15:23 N/A Verification/Time Out Taken: Pain Control: N/A Other N/A Tissue Debrided: N/A Fibrin/Slough, N/A Subcutaneous Level: N/A Skin/Subcutaneous N/A Tissue Debridement Area (sq N/A 13.12 N/A cm): Instrument: N/A Curette N/A Bleeding: N/A Minimum N/A Hemostasis Achieved: N/A Pressure N/A Procedural Pain: N/A 3 N/A Post Procedural Pain: N/A 1 N/A Debridement Treatment N/A Procedure was tolerated N/A Response: well Post Debridement N/A 4.1x3.2x0.5 N/A Measurements L x W x D (cm) Post Debridement N/A 5.152 N/A Volume: (cm) Post Debridement N/A Category/Stage III N/A Stage: Periwound Skin Texture: Excoriation: No Excoriation: No N/A Induration: No Induration: No Callus: No Callus: No Crepitus: No Crepitus: No Rash: No Rash: No Scarring: No Scarring: No Periwound Skin Maceration: No Maceration: No N/A Moisture: Dry/Scaly: No Dry/Scaly: No Periwound Skin Color: Atrophie Blanche: No Hemosiderin Staining: Yes N/A Cyanosis: No Atrophie Blanche: No Ecchymosis: No Cyanosis: No Erythema: No Ecchymosis: No Hemosiderin Staining: No Erythema: No Mottled: No Mottled: No Pallor: No Pallor: No Rubor: No Rubor: No Temperature: No Abnormality No Abnormality N/A Tenderness on Yes No N/A Palpation: Wound Preparation: N/A MAKAIAH, [name] ([id number]) Ulcer Cleansing: Ulcer Cleansing: Rinsed/Irrigated with Rinsed/Irrigated with Saline Saline Topical Anesthetic Topical Anesthetic Applied: Other: lidocaine Applied: Other: lidocaine 4% 4% Procedures Performed: N/A Debridement N/A Treatment Notes Electronic Signature(s) Signed: 03/24/2017 3:28:23 PM By: Christin Fudge MD, FACS Entered By: Christin Fudge on 03/24/2017 15:28:23 [name]  ([id number]) -------------------------------------------------------------------------------- [place] Details Patient Name: [name] Date of Service: 03/24/2017 3:00 PM Medical Record Number: [id number] Patient Account Number: [id number] Date of Birth/Sex: August 14, 1931 (83 y.o. Female) Treating RN: Afful, RN, BSN, Velva Harman Primary Care Kierra Jezewski: [name] Other Clinician: Referring Keshun Berrett: [name] Treating Ziyah Cordoba/Extender: Frann Rider in Treatment: 28 Active Inactive ` Abuse / Safety / Falls / Self Care Management Nursing Diagnoses: Impaired physical mobility Potential for falls Goals: Patient will remain injury free Date Initiated: 09/06/2016 Target Resolution Date: 12/02/2016 Goal Status: Active Interventions: Assess fall risk on admission and as needed Assess self care needs on admission and as needed Notes: ` Necrotic Tissue Nursing Diagnoses: Impaired tissue integrity related to necrotic/devitalized tissue Goals: Necrotic/devitalized tissue will be minimized in the wound bed Date Initiated: 09/06/2016 Target Resolution Date: 12/02/2016 Goal Status: Active Interventions: Assess patient pain level pre-, during and post procedure and prior to discharge Notes: ` Orientation to the Wound Care Program Nursing Diagnoses: [name], [name] ([id number]) Knowledge deficit related to the wound healing center program Goals: Patient/caregiver will verbalize understanding of the [facility] Date Initiated: 09/06/2016 Target Resolution Date: 12/02/2016 Goal Status: Active Interventions: Provide education on orientation to the wound center Notes: ` Pressure Nursing Diagnoses: Knowledge deficit related to management of pressures ulcers  Potential for impaired tissue integrity related to pressure, friction, moisture, and shear Goals: Patient will remain free of pressure ulcers Date Initiated: 09/06/2016 Target  Resolution Date: 12/02/2016 Goal Status: Active Interventions: Assess: immobility, friction, shearing, incontinence upon admission and as needed Notes: ` Soft Tissue Infection Nursing Diagnoses: Impaired tissue integrity Potential for infection: soft tissue Goals: Patient will remain free of wound infection Date Initiated: 09/06/2016 Target Resolution Date: 12/02/2016 Goal Status: Active Interventions: Assess signs and symptoms of infection every visit Notes: ` Wound/Skin Impairment [name], [name] ([id number]) Nursing Diagnoses: Impaired tissue integrity Goals: Ulcer/skin breakdown will heal within 14 weeks Date Initiated: 09/06/2016 Target Resolution Date: 12/16/2016 Goal Status: Active Interventions: Assess ulceration(s) every visit Notes: Electronic Signature(s) Signed: 03/24/2017 4:19:01 PM By: Regan Lemming BSN, RN Entered By: Regan Lemming on 03/24/2017 15:22:14 [name] ([id number]) -------------------------------------------------------------------------------- Pain Assessment Details Patient Name: [name]. Date of Service: 03/24/2017 3:00 PM Medical Record Number: [id number] Patient Account Number: [id number] Date of Birth/Sex: 04-24-1931 (83 y.o. Female) Treating RN: Afful, RN, BSN, Velva Harman Primary Care Fin Hupp: [name] Other Clinician: Referring Momen Ham: [name] Treating Crystalynn Mcinerney/Extender: Frann Rider in Treatment: 28 Active Problems Location of Pain Severity and Description of Pain Patient Has Paino No Site Locations With Dressing Change: No Pain Management and Medication Current Pain Management: Electronic Signature(s) Signed: 03/24/2017 4:19:01 PM By: Regan Lemming BSN, RN Entered By: Regan Lemming on 03/24/2017 15:05:03 [name] ([id number]) -------------------------------------------------------------------------------- Patient/Caregiver Education Details Patient Name: [name]. Date of Service: 03/24/2017 3:00  PM Medical Record Number: [id number] Patient Account Number: [id number] Date of Birth/Gender: [birth year] (83 y.o. Female) Treating RN: Baruch Gouty, RN, BSN, Velva Harman Primary Care Physician: [name] Other Clinician: Referring Physician: [name] Treating Physician/Extender: Frann Rider in Treatment: 75 Education Assessment Education Provided To: Patient Education Topics Provided Welcome To [facility]: Methods: Explain/Verbal Responses: State content correctly Wound Debridement: Methods: Explain/Verbal Responses: State content correctly Wound/Skin Impairment: Methods: Explain/Verbal Responses: State content correctly Electronic Signature(s) Signed: 03/24/2017 4:19:01 PM By: Regan Lemming BSN, RN Entered By: Regan Lemming on 03/24/2017 16:07:31 [name] ([id number]) -------------------------------------------------------------------------------- Wound Assessment Details Patient Name: [name]. Date of Service: 03/24/2017 3:00 PM Medical Record Number: [id number] Patient Account Number: [id number] Date of Birth/Sex: 08-07-1931 (83 y.o. Female) Treating RN: Afful, RN, BSN, [place] Primary Care Merek Niu: [name] Other Clinician: Referring Feather Berrie: [name] Treating Elizjah Noblet/Extender: Frann Rider in Treatment: 28 Wound Status Wound Number: 1 Primary Etiology: Pressure Ulcer Wound Location: Right Calcaneus - Medial Wound Status: Open Wounding Event: Pressure Injury Comorbid History: Cataracts, Hypertension Date Acquired: 07/02/2016 Weeks Of Treatment: 28 Clustered Wound: No Photos Photo Uploaded By: Regan Lemming on 03/24/2017 16:10:31 Wound Measurements Length: (cm) 1 Width: (cm) 1.5 Depth: (cm) 0.2 Area: (cm) 1.178 Volume: (cm) 0.236 % Reduction in Area: 90.5% % Reduction in Volume: 80.9% Epithelialization: Small (1-33%) Tunneling: No Undermining: No Wound Description Classification: Category/Stage III Foul  Odor Aft Wound Margin: Indistinct, nonvisible Slough/Fibrin Exudate Amount: Large Exudate Type: Serosanguineous Exudate Color: red, brown er Cleansing: No o Yes Wound Bed Granulation Amount: Small (1-33%) Exposed Structure Granulation Quality: Pink, Pale Fascia Exposed: No Necrotic Amount: Large (67-100%) Fat Layer (Subcutaneous Tissue) Exposed: Yes Necrotic Quality: Adherent Slough Tendon Exposed: No Erbe, Cannie S. ([id number]) Muscle Exposed: No Joint Exposed: No Bone Exposed: No Periwound Skin Texture Texture Color No Abnormalities Noted: No No Abnormalities Noted: No Callus: No Atrophie Blanche: No Crepitus: No Cyanosis: No Excoriation: No Ecchymosis: No Induration: No Erythema: No  Rash: No Hemosiderin Staining: No Scarring: No Mottled: No Pallor: No Moisture Rubor: No No Abnormalities Noted: No Dry / Scaly: No Temperature / Pain Maceration: No Temperature: No Abnormality Tenderness on Palpation: Yes Wound Preparation Ulcer Cleansing: Rinsed/Irrigated with Saline Topical Anesthetic Applied: Other: lidocaine 4%, Treatment Notes Wound #1 (Right, Medial Calcaneus) 1. Cleansed with: Clean wound with Normal Saline 4. Dressing Applied: Aquacel Ag Other dressing (specify in notes) 5. Secondary Dressing Applied Guaze, ABD and kerlix/Conform 7. Secured with Tape Notes sorbact with hydrogel to right heel, bilateral heel cups Electronic Signature(s) Signed: 03/24/2017 4:19:01 PM By: Regan Lemming BSN, RN Entered By: Regan Lemming on 03/24/2017 15:14:45 [name] ([id number]) -------------------------------------------------------------------------------- Wound Assessment Details Patient Name: [name]. Date of Service: 03/24/2017 3:00 PM Medical Record Number: [id number] Patient Account Number: [id number] Date of Birth/Sex: 1931/06/22 (83 y.o. Female) Treating RN: Afful, RN, BSN, [place] Primary Care Griselda Tosh: [name] Other Clinician: Referring  Demarie Hyneman: [name] Treating Anhthu Perdew/Extender: Frann Rider in Treatment: 28 Wound Status Wound Number: 2 Primary Etiology: Pressure Ulcer Wound Location: Left Calcaneus - Medial Wound Status: Open Wounding Event: Pressure Injury Comorbid History: Cataracts, Hypertension Date Acquired: 07/02/2016 Weeks Of Treatment: 28 Clustered Wound: No Photos Photo Uploaded By: Regan Lemming on 03/24/2017 16:10:32 Wound Measurements Length: (cm) 4.1 Width: (cm) 3.2 Depth: (cm) 0.4 Area: (cm) 10.304 Volume: (cm) 4.122 % Reduction in Area: 47.9% % Reduction in Volume: -108.3% Epithelialization: None Tunneling: No Undermining: No Wound Description Classification: Category/Stage III Foul Odor Aft Wound Margin: Indistinct, nonvisible Due to Produc Exudate Amount: Large Slough/Fibrin Exudate Type: Serosanguineous Exudate Color: red, brown er Cleansing: Yes t Use: No o Yes Wound Bed Granulation Amount: Small (1-33%) Exposed Structure Granulation Quality: Pink, Pale Fascia Exposed: No Necrotic Amount: Large (67-100%) Fat Layer (Subcutaneous Tissue) Exposed: Yes Necrotic Quality: Adherent Slough Tendon Exposed: No Nissen, Hermina S. ([id number]) Muscle Exposed: No Joint Exposed: No Bone Exposed: No Periwound Skin Texture Texture Color No Abnormalities Noted: No No Abnormalities Noted: No Callus: No Atrophie Blanche: No Crepitus: No Cyanosis: No Excoriation: No Ecchymosis: No Induration: No Erythema: No Rash: No Hemosiderin Staining: Yes Scarring: No Mottled: No Pallor: No Moisture Rubor: No No Abnormalities Noted: No Dry / Scaly: No Temperature / Pain Maceration: No Temperature: No Abnormality Wound Preparation Ulcer Cleansing: Rinsed/Irrigated with Saline Topical Anesthetic Applied: Other: lidocaine 4%, Treatment Notes Wound #2 (Left, Medial Calcaneus) 1. Cleansed with: Clean wound with Normal Saline 4. Dressing Applied: Aquacel Ag Other dressing  (specify in notes) 5. Secondary Dressing Applied Guaze, ABD and kerlix/Conform 7. Secured with Tape Notes sorbact with hydrogel to right heel, bilateral heel cups Electronic Signature(s) Signed: 03/24/2017 4:19:01 PM By: Regan Lemming BSN, RN Entered By: Regan Lemming on 03/24/2017 15:16:46 [name] ([id number]) -------------------------------------------------------------------------------- Vitals Details Patient Name: [name]. Date of Service: 03/24/2017 3:00 PM Medical Record Number: [id number] Patient Account Number: [id number] Date of Birth/Sex: Dec 25, 1930 (83 y.o. Female) Treating RN: Afful, RN, BSN, [place] Primary Care Darcy Cordner: [name] Other Clinician: Referring Abagael Kramm: [name] Treating Kamryn Messineo/Extender: Frann Rider in Treatment: 28 Vital Signs Time Taken: 15:05 Temperature (F): 98.0 Height (in): 63 Pulse (bpm): 68 Weight (lbs): 160 Respiratory Rate (breaths/min): 17 Body Mass Index (BMI): 28.3 Blood Pressure (mmHg): 118/80 Reference Range: 80 - 120 mg / dl Electronic Signature(s) Signed: 03/24/2017 4:19:01 PM By: Regan Lemming BSN, RN Entered By: Regan Lemming on 03/24/2017 15:05:26

## 2017-03-25 NOTE — Progress Notes (Signed)
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 amount of necrotic tissue within the wound bed including Adherent Slough. The periwound skin appearance exhibited: Hemosiderin Staining. The periwound skin appearance did not exhibit: Callus, Crepitus, Excoriation, Induration, Rash, Scarring, Dry/Scaly, Maceration, Atrophie Blanche, Cyanosis, Ecchymosis, Mottled, Pallor, Rubor, Erythema. Periwound temperature was noted as No Abnormality. 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 There was a Skin/Subcutaneous Tissue Debridement (16010-93235) debridement with total area of 13.12 sq cm performed by Christin Fudge, MD. with the following instrument(s): Curette to remove Viable and Non-Viable tissue/material including Fibrin/Slough and Subcutaneous after achieving pain control using Other (lidocaine 4%). A time out was conducted at 15:23, prior to the start of the procedure. A Minimum  amount of bleeding was controlled with Pressure. The procedure was tolerated well with a pain level of 3 throughout and a pain level of 1 following the procedure. Post Debridement Measurements: 4.1cm length x 3.2cm width x 0.5cm depth; 5.152cm^3 volume. Post debridement Stage noted as Category/Stage III. Character of Wound/Ulcer Post Debridement requires further debridement. Post procedure Diagnosis Wound #2: Same as Pre-Procedure Plan Wound Cleansing: Wound #1 Right,Medial Calcaneus: Brunet, Tomeshia S. ([id number]) Clean wound with Normal Saline. Wound #2 Left,Medial Calcaneus: Clean wound with Normal Saline. Anesthetic: Wound #1 Right,Medial Calcaneus: Topical Lidocaine 4% cream applied to wound bed prior to debridement Wound #2 Left,Medial Calcaneus: Topical Lidocaine 4% cream applied to wound bed prior to debridement Skin Barriers/Peri-Wound Care: Wound #1 Right,Medial Calcaneus: Skin Prep Wound #2 Left,Medial Calcaneus: Skin Prep Primary Wound Dressing: Wound #1 Right,Medial Calcaneus: Other: - Sorbact and Hydrogel Wound #2 Left,Medial Calcaneus: Aquacel Ag Secondary Dressing: Wound #1 Right,Medial Calcaneus: ABD and Kerlix/Conform Other - Heel cups Wound #2 Left,Medial Calcaneus: ABD and Kerlix/Conform Other - Heel cups Dressing Change Frequency: Wound #1 Right,Medial Calcaneus: Change Dressing Monday, Wednesday, Friday Wound #2 Left,Medial Calcaneus: Change Dressing Monday, Wednesday, Friday Follow-up Appointments: Wound #1 Right,Medial Calcaneus: Return Appointment in 1 week. Wound #2 Left,Medial Calcaneus: Return Appointment in 1 week. Edema Control: Wound #1 Right,Medial Calcaneus: Elevate legs to the level of the heart and pump ankles as often as possible Wound #2 Left,Medial Calcaneus: Elevate legs to the level of the heart and pump ankles as often as possible Off-Loading: Wound #1 Right,Medial Calcaneus: Other: - No pressure on heels!!! Float heel while in  chair or bed. Sage boots at night along with floating heels. Wound #2 Left,Medial Calcaneus: Other: - No pressure on heels!!! Float heel while in chair or bed. Sage boots at night along with floating heels. Additional Orders / Instructions: Wound #1 Right,Medial Calcaneus: Increase protein intake. Other: - Vitamins A, C and Zinc [name], Danielle S. ([id number]) Wound #2 Left,Medial Calcaneus: Increase protein intake. Other: - Vitamins A, C and Zinc Home Health: Wound #1 Right,Medial Calcaneus: Continue Home Health Visits - [place] Nurse may visit PRN to address patient s wound care needs. FACE TO FACE ENCOUNTER: MEDICARE and MEDICAID PATIENTS: I certify that this patient is under my care and that I had a  face-to-face encounter that meets the physician face-to-face encounter requirements with this patient on this date. The encounter with the patient was in whole or in part for the following MEDICAL CONDITION: (primary reason for [place]) MEDICAL NECESSITY: I certify, that based on my findings, NURSING services are a medically necessary home health service. HOME BOUND STATUS: I certify that my clinical findings support that this patient is homebound (i.e., Due to illness or injury, pt requires aid of supportive devices such as crutches, cane, wheelchairs, walkers, the use of special transportation or the assistance of another person to leave their place of residence. There is a normal inability to leave the home and doing so requires considerable and taxing effort. Other absences are for medical reasons / religious services and are infrequent or of short duration when for other reasons). If current dressing causes regression in wound condition, may D/C ordered dressing product/s and apply Normal Saline Moist Dressing daily until next [place] / Other MD appointment. [place] of regression in wound condition at [phone]. Please direct any  NON-WOUND related issues/requests for orders to patient's Primary Care Physician Wound #2 Left,Medial Calcaneus: [place] Visits - [place] Nurse may visit PRN to address patient s wound care needs. FACE TO FACE ENCOUNTER: MEDICARE and MEDICAID PATIENTS: I certify that this patient is under my care and that I had a face-to-face encounter that meets the physician face-to-face encounter requirements with this patient on this date. The encounter with the patient was in whole or in part for the following MEDICAL CONDITION: (primary reason for [place]) MEDICAL NECESSITY: I certify, that based on my findings, NURSING services are a medically necessary home health service. HOME BOUND STATUS: I certify that my clinical findings support that this patient is homebound (i.e., Due to illness or injury, pt requires aid of supportive devices such as crutches, cane, wheelchairs, walkers, the use of special transportation or the assistance of another person to leave their place of residence. There is a normal inability to leave the home and doing so requires considerable and taxing effort. Other absences are for medical reasons / religious services and are infrequent or of short duration when for other reasons). If current dressing causes regression in wound condition, may D/C ordered dressing product/s and apply Normal Saline Moist Dressing daily until next [place] / Other MD appointment. [place] of regression in wound condition at [phone]. Please direct any NON-WOUND related issues/requests for orders to patient's Primary Care Physician After review today I have recommended Sorbact with hydrogel to be changed every other day to the right heel. Hopefully next week we will have the skin substitute for application. This will be #8 The left medial heel has deteriorated over the last couple of weeks and after debriding this I have recommended  dressing changes to the left heel is with silver alginate on alternate days. [name], [name] ([id number]) Electronic Signature(s) Signed: 03/24/2017 3:31:22 PM By: Christin Fudge MD, FACS Entered By: Christin Fudge on 03/24/2017 15:31:22 [name] ([id number]) -------------------------------------------------------------------------------- SuperBill Details Patient Name: [name] Date of Service: 03/24/2017 Medical Record Number: [id number] Patient Account Number: [id number] Date of Birth/Sex: Dec 03, 1930 (83 y.o. Female) Treating RN: Baruch Gouty, RN, BSN, Velva Harman Primary Care Provider: [name] Other Clinician: Referring Provider: [name] Treating Provider/Extender: Frann Rider in Treatment: 28 Diagnosis Coding ICD-10 Codes Code Description L89.620 Pressure ulcer of left heel, unstageable L89.610 Pressure ulcer of right heel, unstageable L97.512  Non-pressure chronic ulcer of other part of right foot with fat layer exposed Z99.3 Dependence on wheelchair L89.322 Pressure ulcer of left buttock, stage 2 Facility Procedures CPT4 Code: [id number] Description: 86381 - DEB SUBQ TISSUE 20 SQ CM/< ICD-10 Description Diagnosis L89.620 Pressure ulcer of left heel, unstageable Z99.3 Dependence on wheelchair L89.610 Pressure ulcer of right heel, unstageable Modifier: Quantity: 1 Physician Procedures CPT4 Code: [id number] Description: 90383 - WC PHYS SUBQ TISS 20 SQ CM ICD-10 Description Diagnosis L89.620 Pressure ulcer of left heel, unstageable Z99.3 Dependence on wheelchair L89.610 Pressure ulcer of right heel, unstageable Modifier: Quantity: 1 Electronic Signature(s) Signed: 03/24/2017 3:31:37 PM By: Christin Fudge MD, FACS Entered By: Christin Fudge on 03/24/2017 15:31:37

## 2017-03-28 ENCOUNTER — Emergency Department: Payer: Medicare Other

## 2017-03-28 ENCOUNTER — Inpatient Hospital Stay
Admission: EM | Admit: 2017-03-28 | Discharge: 2017-03-30 | DRG: 593 | Disposition: A | Discharge status: Home Health | Payer: Medicare Other | Attending: Internal Medicine | Admitting: Internal Medicine

## 2017-03-28 ENCOUNTER — Inpatient Hospital Stay: Payer: Medicare Other

## 2017-03-28 ENCOUNTER — Encounter: Payer: Self-pay | Admitting: Internal Medicine

## 2017-03-28 DIAGNOSIS — N179 Acute kidney failure, unspecified: Secondary | ICD-10-CM | POA: Diagnosis present

## 2017-03-28 DIAGNOSIS — L8990 Pressure ulcer of unspecified site, unspecified stage: Secondary | ICD-10-CM | POA: Insufficient documentation

## 2017-03-28 DIAGNOSIS — B370 Candidal stomatitis: Secondary | ICD-10-CM | POA: Diagnosis not present

## 2017-03-28 DIAGNOSIS — Z79899 Other long term (current) drug therapy: Secondary | ICD-10-CM

## 2017-03-28 DIAGNOSIS — T83510A Infection and inflammatory reaction due to cystostomy catheter, initial encounter: Secondary | ICD-10-CM | POA: Diagnosis not present

## 2017-03-28 DIAGNOSIS — N390 Urinary tract infection, site not specified: Secondary | ICD-10-CM | POA: Diagnosis not present

## 2017-03-28 DIAGNOSIS — Z96653 Presence of artificial knee joint, bilateral: Secondary | ICD-10-CM | POA: Diagnosis present

## 2017-03-28 DIAGNOSIS — R410 Disorientation, unspecified: Secondary | ICD-10-CM | POA: Diagnosis present

## 2017-03-28 DIAGNOSIS — K219 Gastro-esophageal reflux disease without esophagitis: Secondary | ICD-10-CM | POA: Diagnosis not present

## 2017-03-28 DIAGNOSIS — R6510 Systemic inflammatory response syndrome (SIRS) of non-infectious origin without acute organ dysfunction: Secondary | ICD-10-CM

## 2017-03-28 DIAGNOSIS — F0390 Unspecified dementia without behavioral disturbance: Secondary | ICD-10-CM | POA: Diagnosis present

## 2017-03-28 DIAGNOSIS — R7989 Other specified abnormal findings of blood chemistry: Secondary | ICD-10-CM

## 2017-03-28 DIAGNOSIS — I130 Hypertensive heart and chronic kidney disease with heart failure and stage 1 through stage 4 chronic kidney disease, or unspecified chronic kidney disease: Secondary | ICD-10-CM | POA: Diagnosis present

## 2017-03-28 DIAGNOSIS — L89610 Pressure ulcer of right heel, unstageable: Secondary | ICD-10-CM | POA: Diagnosis present

## 2017-03-28 DIAGNOSIS — E860 Dehydration: Secondary | ICD-10-CM | POA: Diagnosis not present

## 2017-03-28 DIAGNOSIS — I248 Other forms of acute ischemic heart disease: Secondary | ICD-10-CM | POA: Diagnosis present

## 2017-03-28 DIAGNOSIS — B964 Proteus (mirabilis) (morganii) as the cause of diseases classified elsewhere: Secondary | ICD-10-CM | POA: Diagnosis present

## 2017-03-28 DIAGNOSIS — Z66 Do not resuscitate: Secondary | ICD-10-CM | POA: Diagnosis not present

## 2017-03-28 DIAGNOSIS — Z90710 Acquired absence of both cervix and uterus: Secondary | ICD-10-CM

## 2017-03-28 DIAGNOSIS — N183 Chronic kidney disease, stage 3 (moderate): Secondary | ICD-10-CM | POA: Diagnosis not present

## 2017-03-28 DIAGNOSIS — M810 Age-related osteoporosis without current pathological fracture: Secondary | ICD-10-CM | POA: Diagnosis not present

## 2017-03-28 DIAGNOSIS — I5032 Chronic diastolic (congestive) heart failure: Secondary | ICD-10-CM | POA: Diagnosis present

## 2017-03-28 DIAGNOSIS — R1311 Dysphagia, oral phase: Secondary | ICD-10-CM | POA: Diagnosis not present

## 2017-03-28 DIAGNOSIS — Y846 Urinary catheterization as the cause of abnormal reaction of the patient, or of later complication, without mention of misadventure at the time of the procedure: Secondary | ICD-10-CM | POA: Diagnosis not present

## 2017-03-28 DIAGNOSIS — L97529 Non-pressure chronic ulcer of other part of left foot with unspecified severity: Secondary | ICD-10-CM

## 2017-03-28 DIAGNOSIS — A419 Sepsis, unspecified organism: Secondary | ICD-10-CM | POA: Diagnosis present

## 2017-03-28 DIAGNOSIS — L97519 Non-pressure chronic ulcer of other part of right foot with unspecified severity: Secondary | ICD-10-CM

## 2017-03-28 DIAGNOSIS — L89620 Pressure ulcer of left heel, unstageable: Secondary | ICD-10-CM | POA: Diagnosis not present

## 2017-03-28 DIAGNOSIS — E876 Hypokalemia: Secondary | ICD-10-CM | POA: Diagnosis not present

## 2017-03-28 DIAGNOSIS — Z7401 Bed confinement status: Secondary | ICD-10-CM

## 2017-03-28 DIAGNOSIS — R1310 Dysphagia, unspecified: Secondary | ICD-10-CM

## 2017-03-28 DIAGNOSIS — L97509 Non-pressure chronic ulcer of other part of unspecified foot with unspecified severity: Secondary | ICD-10-CM

## 2017-03-28 DIAGNOSIS — Z87891 Personal history of nicotine dependence: Secondary | ICD-10-CM

## 2017-03-28 LAB — CBC WITH DIFFERENTIAL/PLATELET
Basophils Absolute: 0.1 10*3/uL (ref 0–0.1)
Basophils Relative: 1 %
Eosinophils Absolute: 0 10*3/uL (ref 0–0.7)
Eosinophils Relative: 0 %
HEMATOCRIT: 29.4 % — AB (ref 35.0–47.0)
HEMOGLOBIN: 9.8 g/dL — AB (ref 12.0–16.0)
LYMPHS PCT: 19 %
Lymphs Abs: 1.6 10*3/uL (ref 1.0–3.6)
MCH: 29.5 pg (ref 26.0–34.0)
MCHC: 33.3 g/dL (ref 32.0–36.0)
MCV: 88.6 fL (ref 80.0–100.0)
MONOS PCT: 9 %
Monocytes Absolute: 0.8 10*3/uL (ref 0.2–0.9)
NEUTROS ABS: 6 10*3/uL (ref 1.4–6.5)
NEUTROS PCT: 71 %
Platelets: 215 10*3/uL (ref 150–440)
RBC: 3.31 MIL/uL — ABNORMAL LOW (ref 3.80–5.20)
RDW: 14.5 % (ref 11.5–14.5)
WBC: 8.5 10*3/uL (ref 3.6–11.0)

## 2017-03-28 LAB — COMPREHENSIVE METABOLIC PANEL
ALBUMIN: 3.4 g/dL — AB (ref 3.5–5.0)
ALT: 10 U/L — ABNORMAL LOW (ref 14–54)
AST: 29 U/L (ref 15–41)
Alkaline Phosphatase: 63 U/L (ref 38–126)
Anion gap: 15 (ref 5–15)
BUN: 56 mg/dL — AB (ref 6–20)
CHLORIDE: 101 mmol/L (ref 101–111)
CO2: 25 mmol/L (ref 22–32)
Calcium: 10.1 mg/dL (ref 8.9–10.3)
Creatinine, Ser: 1.41 mg/dL — ABNORMAL HIGH (ref 0.44–1.00)
GFR calc Af Amer: 39 mL/min — ABNORMAL LOW (ref >60)
GFR, EST NON AFRICAN AMERICAN: 33 mL/min — AB (ref >60)
GLUCOSE: 80 mg/dL (ref 65–99)
Potassium: 3.2 mmol/L — ABNORMAL LOW (ref 3.5–5.1)
SODIUM: 141 mmol/L (ref 135–145)
Total Bilirubin: 1.1 mg/dL (ref 0.3–1.2)
Total Protein: 8.3 g/dL — ABNORMAL HIGH (ref 6.5–8.1)

## 2017-03-28 LAB — URINALYSIS, ROUTINE W REFLEX MICROSCOPIC
Bilirubin Urine: NEGATIVE
GLUCOSE, UA: NEGATIVE mg/dL
Hgb urine dipstick: NEGATIVE
Ketones, ur: 5 mg/dL — AB
NITRITE: NEGATIVE
Protein, ur: 100 mg/dL — AB
SPECIFIC GRAVITY, URINE: 1.013 (ref 1.005–1.030)
pH: 8 (ref 5.0–8.0)

## 2017-03-28 LAB — LIPASE, BLOOD: LIPASE: 17 U/L (ref 11–51)

## 2017-03-28 LAB — LACTIC ACID, PLASMA: LACTIC ACID, VENOUS: 1.2 mmol/L (ref 0.5–1.9)

## 2017-03-28 LAB — TROPONIN I: Troponin I: 0.09 ng/mL (ref <0.03)

## 2017-03-28 LAB — PROTIME-INR
INR: 1.08
PROTHROMBIN TIME: 14 s (ref 11.4–15.2)

## 2017-03-28 LAB — PROCALCITONIN: Procalcitonin: <0.1 ng/mL

## 2017-03-28 MED ORDER — MORPHINE SULFATE (PF) 2 MG/ML IV SOLN
0.5000 mg | INTRAVENOUS | Status: DC | PRN
  Administered 2017-03-28 – 2017-03-29 (×2): 0.5 mg via INTRAVENOUS
  Filled 2017-03-28: qty 1

## 2017-03-28 MED ORDER — MORPHINE SULFATE (PF) 2 MG/ML IV SOLN
INTRAVENOUS | Status: AC
  Administered 2017-03-28: 0.5 mg via INTRAVENOUS
  Filled 2017-03-28: qty 1

## 2017-03-28 MED ORDER — ACETAMINOPHEN 160 MG/5ML PO SOLN
1000.0000 mg | Freq: Once | ORAL | Status: DC
  Filled 2017-03-28: qty 40.6

## 2017-03-28 MED ORDER — SODIUM CHLORIDE 0.9 % IV SOLN
Freq: Once | INTRAVENOUS | Status: AC
  Administered 2017-03-28: 17:00:00 via INTRAVENOUS

## 2017-03-28 MED ORDER — VANCOMYCIN HCL IN DEXTROSE 1-5 GM/200ML-% IV SOLN
1000.0000 mg | Freq: Once | INTRAVENOUS | Status: AC
  Administered 2017-03-28: 1000 mg via INTRAVENOUS
  Filled 2017-03-28: qty 200

## 2017-03-28 MED ORDER — ONDANSETRON HCL 4 MG/2ML IJ SOLN: 4.0000 mg | Freq: Four times a day (QID) | INTRAMUSCULAR | Status: DC | PRN

## 2017-03-28 MED ORDER — DEXTROSE 5 % IV SOLN
1.0000 g | Freq: Once | INTRAVENOUS | Status: AC
  Administered 2017-03-28: 1 g via INTRAVENOUS
  Filled 2017-03-28: qty 10

## 2017-03-28 MED ORDER — POTASSIUM CHLORIDE IN NACL 20-0.9 MEQ/L-% IV SOLN
INTRAVENOUS | Status: DC
  Administered 2017-03-28 – 2017-03-29 (×2): via INTRAVENOUS
  Filled 2017-03-28 (×6): qty 1000

## 2017-03-28 MED ORDER — ENOXAPARIN SODIUM 30 MG/0.3ML ~~LOC~~ SOLN
30.0000 mg | SUBCUTANEOUS | Status: DC
  Administered 2017-03-28 – 2017-03-29 (×2): 30 mg via SUBCUTANEOUS
  Filled 2017-03-28 (×2): qty 0.3

## 2017-03-28 MED ORDER — HYDRALAZINE HCL 20 MG/ML IJ SOLN: 10.0000 mg | Freq: Four times a day (QID) | INTRAMUSCULAR | Status: DC | PRN

## 2017-03-28 MED ORDER — ACETAMINOPHEN 325 MG PO TABS: 650.0000 mg | ORAL_TABLET | Freq: Four times a day (QID) | ORAL | Status: DC | PRN

## 2017-03-28 MED ORDER — ONDANSETRON HCL 4 MG PO TABS: 4.0000 mg | ORAL_TABLET | Freq: Four times a day (QID) | ORAL | Status: DC | PRN

## 2017-03-28 MED ORDER — ALBUTEROL SULFATE (2.5 MG/3ML) 0.083% IN NEBU: 2.5000 mg | INHALATION_SOLUTION | RESPIRATORY_TRACT | Status: DC | PRN

## 2017-03-28 MED ORDER — PIPERACILLIN-TAZOBACTAM 3.375 G IVPB
3.3750 g | Freq: Three times a day (TID) | INTRAVENOUS | Status: DC
  Administered 2017-03-28 – 2017-03-30 (×5): 3.375 g via INTRAVENOUS
  Filled 2017-03-28 (×9): qty 50

## 2017-03-28 MED ORDER — SODIUM CHLORIDE 0.9 % IV BOLUS (SEPSIS)
1000.0000 mL | Freq: Once | INTRAVENOUS | Status: AC
  Administered 2017-03-28: 1000 mL via INTRAVENOUS

## 2017-03-28 MED ORDER — VANCOMYCIN HCL 10 G IV SOLR
1250.0000 mg | INTRAVENOUS | Status: DC
  Administered 2017-03-29: 1250 mg via INTRAVENOUS
  Filled 2017-03-28: qty 1250

## 2017-03-28 MED ORDER — BISACODYL 10 MG RE SUPP: 10.0000 mg | Freq: Every day | RECTAL | Status: DC | PRN

## 2017-03-28 MED ORDER — ACETAMINOPHEN 650 MG RE SUPP: 650.0000 mg | Freq: Four times a day (QID) | RECTAL | Status: DC | PRN

## 2017-03-28 NOTE — ED Provider Notes (Signed)
Clinical Course as of Mar 28 1820  Mon Mar 28, 2017  1457 Assuming care from Dr. Mable Paris.  In short, [name] is a 83 y.o. female with a chief complaint of weakness/AMS.  Refer to the original H&P for additional details.  The current plan of care is to follow up labs and reassess.  Anticipate need for admission due to AMS/sepsis.   [CF]  1610 Patient has demand ischemia with troponin elevation of 0.09.  UA is cloudy but without clear evidence of infection - urine culture is pending.  Recommend continued treatment for probable UTI until culture results are back.  Cancelled second lactic acid due to first result being WNL.    Will reassess patient and talk to family and admit for observation, cycling enzymes, empiric abx.  [CF]  1626 I reassessed the patient who is alert and oriented to self and to her sister who is in the room with her.  She is in no acute distress but appears chronically ill.  I uncovered the chronic wounds on both of her feet and they do appear relatively chronic but the wound on the left heel is very deep with purulent drainage.  I will advise the hospitalist of this when I called to discuss inpatient management.  Of increasing concern is the fact that the patient and her sister report that she has not been able to swallow well for the last 4 days.  While I was in the room she asked for a bag in which to spit because she could not swallow her saliva.  I will advise the hospitalist of my concerns and recommend inpatient stroke workup and will also ask our ED nurse to perform a stroke swallow screen.  I will start an IV infusion of fluids while she is awaiting her bed assignment upstairs.  The patient and her sister agree with the plan.  [CF]  44 Spoke by phone with Dr. Darvin Neighbours with the hospitalist service, verbalized my concerns about the plethora of acute or chronic medical issues the patient is currently experiencing, and he agrees with the plan for inpatient treatment.  [CF]   1632 To facilitate the workup, I have ordered portable flat radiographs of the left foot which has the deepest wound and I am concerned she may have developed osteomyelitis since last radiographs obtained more than 6 months ago cording to the EMR.  [CF]  8341 No evidence of acute osteomyelitis DG Foot Complete Left [CF]    Clinical Course User Index [CF] Danielle Kehr, MD      Danielle Kehr, MD 03/28/17 [phone]

## 2017-03-28 NOTE — Progress Notes (Signed)
Pharmacy Antibiotic Note  [name] is a 83 y.o. female admitted on 03/28/2017 with  sepsis secondary to bilateral foot ulcers.  Pharmacy has been consulted for vancomycin and Zosyn dosing. Patient received vancomycin 1gm IV and Ceftriaxone 1gm IV in ED.   PCT: <0.10  Plan: Ke: 0.028   Vd: 50.8   T1/2: 25  Will start patient on vancomycin 1250mg  IV every 36 hours, with 6 hour stack dosing. Calculated trough at Css is 17.5.  Will monitor renal function and adjust dose as needed. Plan for trough prior to 4th dose.   Will order Zosyn 3.375 IV EI every 8 hours.  Weight: 160 lb (72.6 kg)  Temp (24hrs), Avg:98.4 F (36.9 C), Min:98.4 F (36.9 C), Max:98.4 F (36.9 C)   Recent Labs Lab 03/28/17 1343  WBC 8.5  CREATININE 1.41*  LATICACIDVEN 1.2    Estimated Creatinine Clearance: 28.9 mL/min (A) (by C-G formula based on SCr of 1.41 mg/dL (H)).    No Known Allergies  Antimicrobials this admission: 6/11 Zosyn  >>  6/11 Vancomycin >>   Dose adjustments this admission:   Microbiology results: 6/11 BCx: sent 6/11 UCx: sent   Thank you for allowing pharmacy to be a part of this patient's care.  Pernell Dupre, PharmD, BCPS Clinical Pharmacist 03/28/2017 5:47 PM

## 2017-03-28 NOTE — ED Notes (Signed)
Pt arrived to ED from home where pt lives with sister. PT to ED reporting increased weakness over the past couple days.Pt reports decrease PO intake over the past 2 days due to difficulty swallowing and also presents to ED with suprapubic catheter in place with cloudy urine in drainage bag.   EMS reports temp of 100.9. Pt is afebrile upon arrival to ED. PT Is alert to self and place but disoriented to time and situation.

## 2017-03-28 NOTE — Progress Notes (Signed)
Anticoagulation monitoring(Lovenox):  83 yo female ordered Lovenox 40 mg Q24h  Filed Weights   03/28/17 1346  Weight: 160 lb (72.6 kg)   BMI    Lab Results  Component Value Date   CREATININE 1.41 (H) 03/28/2017   CREATININE 1.17 (H) 06/08/2016   CREATININE 1.11 (H) 04/23/2016   Estimated Creatinine Clearance: 28.9 mL/min (A) (by C-G formula based on SCr of 1.41 mg/dL (H)). Hemoglobin & Hematocrit     Component Value Date/Time   HGB 9.8 (L) 03/28/2017 1343   HGB 12.0 11/25/2011 0936   HCT 29.4 (L) 03/28/2017 1343   HCT 36.7 11/25/2011 0936     Per Protocol for Patient with estCrcl < 30 ml/min and BMI < 40, will transition to Lovenox 30 mg Q24h.

## 2017-03-28 NOTE — ED Notes (Signed)
ED MD at bedside. Pts sister at bedside as well.

## 2017-03-28 NOTE — ED Notes (Signed)
MRI currently talking to pt's sister on phone for screening process.

## 2017-03-28 NOTE — ED Notes (Signed)
Pt reports increased weakness over the past couple days. Pt also reports she has not been eating for the past two days due to reports that she "cannot swallow food" Pt denies having nausea or decreased appetite. Pt has suprapubic catheter in place upon arrival. Site appears clean upon arrival. Site is not covered. Urine is cloudy.

## 2017-03-28 NOTE — H&P (Signed)
[facility] [place] NAME: Hera Celaya    MR#:  [id number]  DATE OF BIRTH:  Dec 07, 1930  DATE OF ADMISSION:  03/28/2017  PRIMARY CARE PHYSICIAN: [name], MD   REQUESTING/REFERRING PHYSICIAN: Dr. Karma Greaser  CHIEF COMPLAINT:   Chief Complaint  Patient presents with  . Weakness    HISTORY OF PRESENT ILLNESS:  Paislee Szatkowski  is a 83 y.o. female with a known history of Retention, diastolic dysfunction, CKD stage III, bedbound status since femur fracture presents to the hospital brought in by sister due to acute onset of dysphagia. Patient also has had a cough, weakness. Has not eaten or drank anything for 3 days. Has trouble with solids and liquids. This was acute onset. No prior problems with swallowing. With EMS patient had febrile 100.8. Has bilateral feet ulcers. Chest x-ray clear.  PAST MEDICAL HISTORY:   Past Medical History:  Diagnosis Date  . Anemia   . Chronic kidney disease, stage 3   . Degenerative joint disease (DJD) of lumbar spine   . Diastolic dysfunction   . Dizziness   . Edema   . ETD (eustachian tube dysfunction)   . Heartburn   . History of rectal bleeding   . HTN (hypertension)   . Hypercalcemia   . Intracranial aneurysm   . Low vision, one eye   . Nephritis and nephropathy   . Osteoporosis   . Pyuria   . Urge incontinence   . Urinary frequency   . Urinary retention     PAST SURGICAL HISTORY:   Past Surgical History:  Procedure Laterality Date  . dilation of esophageal web    . FRACTURE SURGERY Left    femur  . IR GENERIC HISTORICAL  08/05/2016   IR CATHETER TUBE CHANGE 08/05/2016 [name], MD [facility]  . IR GENERIC HISTORICAL  09/17/2016   IR CATHETER TUBE CHANGE 09/17/2016 [facility]  . IR GENERIC HISTORICAL  11/19/2016   IR CATHETER TUBE CHANGE 11/19/2016 [name], MD [facility]  . IR GENERIC HISTORICAL  11/29/2016   IR CATHETER TUBE CHANGE 11/29/2016 [name], DO  [facility]  . JOINT REPLACEMENT Bilateral   . TOTAL KNEE ARTHROPLASTY Bilateral   . VAGINAL HYSTERECTOMY  1967   ovaries also removed because of fibroida    SOCIAL HISTORY:   Social History  Substance Use Topics  . Smoking status: Former Smoker    Quit date: 07/23/1964  . Smokeless tobacco: Never Used     Comment: quit 1965  . Alcohol use No    FAMILY HISTORY:   Family History  Problem Relation Age of Onset  . Kidney disease Neg Hx   . Bladder Cancer Neg Hx     DRUG ALLERGIES:  No Known Allergies  REVIEW OF SYSTEMS:   Review of Systems  Constitutional: Positive for fever and malaise/fatigue. Negative for chills and weight loss.  HENT: Negative for hearing loss and nosebleeds.   Eyes: Negative for blurred vision, double vision and pain.  Respiratory: Positive for cough and sputum production. Negative for hemoptysis, shortness of breath and wheezing.   Cardiovascular: Negative for chest pain, palpitations, orthopnea and leg swelling.  Gastrointestinal: Negative for abdominal pain, constipation, diarrhea, nausea and vomiting.  Genitourinary: Negative for dysuria and hematuria.  Musculoskeletal: Negative for back pain, falls and myalgias.  Skin: Negative for rash.  Neurological: Positive for weakness. Negative for dizziness, tremors, sensory change, speech change, focal weakness, seizures and headaches.  Endo/Heme/Allergies: Does  not bruise/bleed easily.  Psychiatric/Behavioral: Negative for depression and memory loss. The patient is not nervous/anxious.     MEDICATIONS AT HOME:   Prior to Admission medications   Medication Sig Start Date End Date Taking? Authorizing Provider  acetaminophen (TYLENOL) 325 MG tablet Take 650 mg by mouth 2 (two) times daily as needed.   Yes [provider]  amLODipine (NORVASC) 10 MG tablet TAKE ONE TABLET BY MOUTH EVERY DAY HIGH BLOOD PRESSURE 12/13/16  Yes [provider]  amoxicillin-clavulanate (AUGMENTIN) 875-125  MG tablet Take 1 tablet by mouth 2 (two) times daily. 03/23/17 04/02/17 Yes [provider]  atenolol (TENORMIN) 25 MG tablet Take 1 tablet (25 mg total) by mouth daily. 06/10/16  Yes Mody, Ulice Bold, MD  gabapentin (NEURONTIN) 300 MG capsule Take 300 mg by mouth 3 (three) times daily.   Yes [provider]  losartan (COZAAR) 100 MG tablet Take 1 tablet (100 mg total) by mouth daily. 06/10/16  Yes Mody, Ulice Bold, MD  mirabegron ER (MYRBETRIQ) 25 MG TB24 tablet Take 1 tablet (25 mg total) by mouth daily. 02/16/17  Yes Hollice Espy, MD  Multiple Vitamin (MULTIVITAMIN) tablet Take 1 tablet by mouth daily. 06/10/16  Yes Mody, Ulice Bold, MD  oxybutynin (DITROPAN) 5 MG tablet Take 5 mg by mouth 2 (two) times daily. 12/04/16  Yes [provider]     VITAL SIGNS:  Blood pressure (!) 147/80, pulse 97, temperature 98.4 F (36.9 C), temperature source Oral, resp. rate 18, weight 72.6 kg (160 lb), SpO2 100 %.  PHYSICAL EXAMINATION:  Physical Exam  GENERAL:  83 y.o.-year-old patient lying in the bed with no acute distress.  EYES: Pupils equal, round, reactive to light and accommodation. No scleral icterus. Extraocular muscles intact.  HEENT: Head atraumatic, normocephalic. Oropharynx and nasopharynx clear. No oropharyngeal erythema, moist oral mucosa  NECK:  Supple, no jugular venous distention. No thyroid enlargement, no tenderness.  LUNGS: Normal breath sounds bilaterally, no wheezing, rales, rhonchi. No use of accessory muscles of respiration.  CARDIOVASCULAR: S1, S2 normal. No murmurs, rubs, or gallops.  ABDOMEN: Soft, nontender, nondistended. Bowel sounds present. No organomegaly or mass.  EXTREMITIES: No pedal edema, cyanosis, or clubbing. + 2 pedal & radial pulses b/l.   NEUROLOGIC: Cranial nerves II through XII are intact.  Able to move left leg due to prior surgery. Right lower extremity strength 5- over 5 PSYCHIATRIC: The patient is alert and oriented x 3. Good affect.  SKIN: The  lateral heel ulcers. Left heel ulcer with purulent discharge.  LABORATORY PANEL:   CBC  Recent Labs Lab 03/28/17 1343  WBC 8.5  HGB 9.8*  HCT 29.4*  PLT 215   ------------------------------------------------------------------------------------------------------------------  Chemistries   Recent Labs Lab 03/28/17 1343  NA 141  K 3.2*  CL 101  CO2 25  GLUCOSE 80  BUN 56*  CREATININE 1.41*  CALCIUM 10.1  AST 29  ALT 10*  ALKPHOS 63  BILITOT 1.1   ------------------------------------------------------------------------------------------------------------------  Cardiac Enzymes  Recent Labs Lab 03/28/17 1343  TROPONINI 0.09*   ------------------------------------------------------------------------------------------------------------------  RADIOLOGY:  Ct Head Wo Contrast  Result Date: 03/28/2017 CLINICAL DATA:  Code sepsis, altered mental status EXAM: CT HEAD WITHOUT CONTRAST TECHNIQUE: Contiguous axial images were obtained from the base of the skull through the vertex without intravenous contrast. COMPARISON:  MRI brain dated 06/09/2016. FINDINGS: Brain: No evidence of acute infarction, hemorrhage, hydrocephalus, extra-axial collection or mass lesion/mass effect. Subcortical white matter and periventricular small vessel ischemic changes. Vascular: Intracranial atherosclerosis. Skull: Normal. Negative  for fracture or focal lesion. Sinuses/Orbits: The visualized paranasal sinuses are essentially clear. The mastoid air cells are unopacified. Other: None. IMPRESSION: No evidence of acute intracranial abnormality. Small vessel ischemic changes. Electronically Signed   By: Julian Hy M.D.   On: 03/28/2017 14:48   Dg Chest Port 1 View  Result Date: 03/28/2017 CLINICAL DATA:  Difficulty swallowing 3 months.  Possible sepsis. EXAM: PORTABLE CHEST 1 VIEW COMPARISON:  06/08/2016 FINDINGS: Lungs are adequately inflated without focal airspace consolidation or effusion.  Subtle density of the lateral left lower thorax likely bony in origin as there are adjacent old rib fractures. Cardiomediastinal silhouette is within normal. There is calcified plaque over the aortic arch. Remaining bones and soft tissues are unchanged. IMPRESSION: No acute cardiopulmonary disease. Focal density over the lateral left thorax likely bony in origin related to adjacent rib fractures. Consider follow-up chest radiograph 3 months. Aortic atherosclerosis. Electronically Signed   By: Marin Olp M.D.   On: 03/28/2017 14:32     IMPRESSION AND PLAN:   * Bilateral foot ulcers. Left foot ulcer has purulent discharge. Patient had febrile 100.8. Check x-rays to rule out osteomyelitis. We will start vancomycin and Zosyn at this time. Cultures pending. Consult wound care team. Podiatry consult.  * Acute dysphagia. Patient has not had any trouble swallowing prior to this. Doesn't seem like a stricture or mass. Need to rule out CVA. CT scan of the head done. Will get an MRI of the brain without contrast. Swallowing evaluation in the morning. Depending on the progress may need barium swallow study. Start IV fluids.  * Dehydration due to decreased oral intake. On IV fluids.  * UTI related to suprapubic catheter. On IV antibiotics.  * Hypertension. Add IV when necessary hydralazine.  * DVT prophylaxis with Lovenox  All the records are reviewed and case discussed with ED provider. Management plans discussed with the patient, family and they are in agreement.  CODE STATUS: DNR  TOTAL TIME TAKING CARE OF THIS PATIENT: 40 minutes.   Hillary Bow R M.D on 03/28/2017 at 4:51 PM  Between 7am to 6pm - Pager - [phone]  After 6pm go to [email or web address] - password [email or web address] [facility]  Office  [phone]  CC: Primary care physician; [name], MD  Note: This dictation was prepared with Dragon dictation along with smaller phrase technology. Any transcriptional  errors that result from this process are unintentional.

## 2017-03-28 NOTE — Consult Note (Signed)
[place] Nurse wound consult note Reason for Consult: Patient admitted for weakness.  Is currently seen at [facility] for an open pressure ulcer 2 both heels and drainage and odor from the area of her right toes for about 2 months now.  He is being treated with Affinity amniotic membrane to promote wound healing. This is a nonremovable dressing until 03/31/17.  Kerlix was removed for X ray, but mesh dressing is still intact.  Asked bedside RN to wrap this dressing and will reassess on 03/31/17 per MD order or prior to discharge.  Bedside RN verbalizes understanding.  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   Pressure Injury POA: Yes  Drainage (amount, consistency, odor) Serosanguinous drainage and musty odor.  Periwound: Intact Dressing procedure/placement/frequency: kerlix to bilateral feel.  Nonremovable dressing to heels from [facility].  (Affinity dressing).  Will assess on 03/31/17 or prior to discharge. [facility] team will follow.  Domenic Moras RN BSN [place] Pager [phone]

## 2017-03-28 NOTE — Progress Notes (Signed)
Advance care planning  Met with patient with her sister at bedside.  Patient wants her sister to be her health healthcare power of attorney.  Discussed with patient regarding her CODE STATUS. She does not want any aggressive measures if she comes to appointment where she needs intubation or CPR. Discussed regarding DO NOT RESUSCITATE status and patient agrees. entered  Orders.  Time spent 20 minutes

## 2017-03-28 NOTE — ED Notes (Signed)
Admitting MD at bedside.

## 2017-03-28 NOTE — ED Notes (Signed)
Pt crying out in pain after left foot has been moved around for xray. Pt refused tylenol and denies feeling able to swallow a pill.

## 2017-03-28 NOTE — ED Provider Notes (Signed)
[facility] Emergency Department Provider Note  ____________________________________________   First MD Initiated Contact with Patient 03/28/17 1345     (approximate)  I have reviewed the triage vital signs and the nursing notes.   HISTORY  Chief Complaint Weakness  Level V exemption history Limited by the patient's dementia  HPI [name] is a 83 y.o. female who comes to the emergency department via EMS for reported weakness. According to EMS the patient's caretaker called because she was weak and more tired than usual recently. She was 100.8 for EMS. She has a past medical history ofdementia, urinary retention for which she has a suprapubic catheter, and has recently had a productive cough. EMS noted that the patient coughed up brown thick sputum onto them. History is somewhat mixed because after the patient arrived her sister showed up and said that she called 911 because the patient has had difficulty swallowing recently.   Past Medical History:  Diagnosis Date  . Anemia   . Chronic kidney disease, stage 3   . Degenerative joint disease (DJD) of lumbar spine   . Diastolic dysfunction   . Dizziness   . Edema   . ETD (eustachian tube dysfunction)   . Heartburn   . History of rectal bleeding   . HTN (hypertension)   . Hypercalcemia   . Intracranial aneurysm   . Low vision, one eye   . Nephritis and nephropathy   . Osteoporosis   . Pyuria   . Urge incontinence   . Urinary frequency   . Urinary retention     Patient Active Problem List   Diagnosis Date Noted  . Pressure ulcer 06/09/2016  . Syncope 06/08/2016  . Urinary tract infection 06/08/2016  . Periprosthetic fracture around internal prosthetic left knee joint 05/25/2016  . Fracture of femur, distal, left, closed ([place]) 05/25/2016  . Closed displaced fracture of base of fourth metacarpal bone of right hand 05/25/2016  . Closed displaced fracture of base of fifth metacarpal bone of  right hand 05/25/2016  . Traumatic perinephric hematoma 05/09/2016  . SAH (subarachnoid hemorrhage) ([place]) 05/08/2016  . Trauma 05/08/2016  . SDH (subdural hematoma) ([place]) 05/08/2016  . Perinephric hematoma 05/08/2016  . Hypertension 05/08/2016  . Fractures involving multiple body regions 05/08/2016  . CKD (chronic kidney disease) 05/08/2016  . Brain edema ([place]) 05/08/2016  . Acute blood loss anemia 05/08/2016    Past Surgical History:  Procedure Laterality Date  . dilation of esophageal web    . FRACTURE SURGERY Left    femur  . IR GENERIC HISTORICAL  08/05/2016   IR CATHETER TUBE CHANGE 08/05/2016 [name], MD [facility]  . IR GENERIC HISTORICAL  09/17/2016   IR CATHETER TUBE CHANGE 09/17/2016 [facility]  . IR GENERIC HISTORICAL  11/19/2016   IR CATHETER TUBE CHANGE 11/19/2016 [name], MD [facility]  . IR GENERIC HISTORICAL  11/29/2016   IR CATHETER TUBE CHANGE 11/29/2016 [name], DO [facility]  . JOINT REPLACEMENT Bilateral   . TOTAL KNEE ARTHROPLASTY Bilateral   . VAGINAL HYSTERECTOMY  1967   ovaries also removed because of fibroida    Prior to Admission medications   Medication Sig Start Date End Date Taking? Authorizing Provider  acetaminophen (TYLENOL) 325 MG tablet Take 650 mg by mouth 2 (two) times daily as needed.    [provider]  amLODipine (NORVASC) 10 MG tablet TAKE ONE TABLET BY MOUTH EVERY DAY HIGH BLOOD PRESSURE 12/13/16   [provider]  atenolol (TENORMIN) 25 MG tablet Take 1 tablet (25 mg total) by mouth daily. 06/10/16   Bettey Costa, MD  clindamycin (CLEOCIN) 300 MG capsule  08/31/16   [provider]  ferrous sulfate 325 (65 FE) MG tablet Take 325 mg by mouth daily with breakfast.    [provider]  gabapentin (NEURONTIN) 300 MG capsule Take 300 mg by mouth 3 (three) times daily.    [provider]  losartan (COZAAR) 100 MG tablet Take 1 tablet (100 mg total) by mouth daily. 06/10/16    Bettey Costa, MD  mirabegron ER (MYRBETRIQ) 25 MG TB24 tablet Take 1 tablet (25 mg total) by mouth daily. 02/16/17   Hollice Espy, MD  Multiple Vitamin (MULTIVITAMIN) tablet Take 1 tablet by mouth daily. 06/10/16   Bettey Costa, MD  oxybutynin (DITROPAN) 5 MG tablet Take 5 mg by mouth 2 (two) times daily. 12/04/16   [provider]  silver sulfADIAZINE (SILVADENE) 1 % cream  08/31/16   [provider]    Allergies Patient has no known allergies.  Family History  Problem Relation Age of Onset  . Kidney disease Neg Hx   . Bladder Cancer Neg Hx     Social History Social History  Substance Use Topics  . Smoking status: Former Smoker    Quit date: 07/23/1964  . Smokeless tobacco: Never Used     Comment: quit 1965  . Alcohol use No    Review of Systems Level V exemption history Limited by the patient's dementia  ____________________________________________   PHYSICAL EXAM:  VITAL SIGNS: ED Triage Vitals  Enc Vitals Group     BP      Pulse      Resp      Temp      Temp src      SpO2      Weight      Height      Head Circumference      Peak Flow      Pain Score      Pain Loc      Pain Edu?      Excl. in [place]?     Constitutional: In no acute distress. Pleasant but clearly severely demented Eyes: PERRL EOMI. Head: Atraumatic. Nose: No congestion/rhinnorhea. Mouth/Throat: No trismus Neck: No stridor.   Cardiovascular: Normal rate, regular rhythm. Grossly normal heart sounds.  Good peripheral circulation. Respiratory: Normal respiratory effort.  No retractions. Lungs CTAB and moving good air Gastrointestinal: Soft nontender suprapubic catheter in place with foul smelling thick discharge Musculoskeletal: No lower extremity edema   Neurologic:   No gross focal neurologic deficits are appreciated. Skin:  Skin is warm, dry and intact. No rash noted. Psychiatric: Severe dementia    ____________________________________________   DIFFERENTIAL  Sepsis,  pneumonia, urosepsis, bacteremia, stroke ____________________________________________   LABS (all labs ordered are listed, but only abnormal results are displayed)  Labs Reviewed  CULTURE, BLOOD (ROUTINE X 2)  CULTURE, BLOOD (ROUTINE X 2)  URINE CULTURE  LACTIC ACID, PLASMA  LACTIC ACID, PLASMA  COMPREHENSIVE METABOLIC PANEL  LIPASE, BLOOD  TROPONIN I  CBC WITH DIFFERENTIAL/PLATELET  PROCALCITONIN  PROTIME-INR  URINALYSIS, ROUTINE W REFLEX MICROSCOPIC    Pending __________________________________________  EKG  Pending ____________________________________________  RADIOLOGY  Chest x-ray with no acute disease Head CT with no acute disease ____________________________________________   PROCEDURES  Procedure(s) performed: no  Procedures  Critical Care performed: yes  CRITICAL CARE Performed by: Darel Hong   Total critical care  time: 35 minutes  Critical care time was exclusive of separately billable procedures and treating other patients.  Critical care was necessary to treat or prevent imminent or life-threatening deterioration.  Critical care was time spent personally by me on the following activities: development of treatment plan with patient and/or surrogate as well as nursing, discussions with consultants, evaluation of patient's response to treatment, examination of patient, obtaining history from patient or surrogate, ordering and performing treatments and interventions, ordering and review of laboratory studies, ordering and review of radiographic studies, pulse oximetry and re-evaluation of patient's condition.   Observation: no ____________________________________________   INITIAL IMPRESSION / ASSESSMENT AND PLAN / ED COURSE  Pertinent labs & imaging results that were available during my care of the patient were reviewed by me and considered in my medical decision making (see chart for details).  Differential is broad in this patient. Her  heart rate is 95 and she is febrile with likely urinary source. As she has SIRS with a likely infection called code sepsis will give her fluids as well as presumptive ceftriaxone. I reviewed previous urine cultures and she has never had one positive.  Disposition pending results of blood work but I anticipate inpatient admission.  Clinical Course as of Mar 28 1458  Mon Mar 28, 2017  1457 Assuming care from Dr. Mable Paris.  In short, [name] is a 83 y.o. female with a chief complaint of weakness/AMS.  Refer to the original H&P for additional details.  The current plan of care is to follow up labs and reassess.  Anticipate need for admission due to AMS/sepsis.   [CF]    Clinical Course User Index [CF] Hinda Kehr, MD     ____________________________________________   FINAL CLINICAL IMPRESSION(S) / ED DIAGNOSES  Final diagnoses:  SIRS (systemic inflammatory response syndrome) ([place])      NEW MEDICATIONS STARTED DURING THIS VISIT:  New Prescriptions   No medications on file     Note:  This document was prepared using Dragon voice recognition software and may include unintentional dictation errors.     Darel Hong, MD 03/28/17 1500

## 2017-03-28 NOTE — ED Notes (Signed)
Called code sepsis to [facility]

## 2017-03-29 DIAGNOSIS — L89620 Pressure ulcer of left heel, unstageable: Secondary | ICD-10-CM | POA: Diagnosis not present

## 2017-03-29 DIAGNOSIS — R410 Disorientation, unspecified: Secondary | ICD-10-CM | POA: Diagnosis not present

## 2017-03-29 LAB — CBC
HEMATOCRIT: 26.7 % — AB (ref 35.0–47.0)
HEMOGLOBIN: 9 g/dL — AB (ref 12.0–16.0)
MCH: 29.5 pg (ref 26.0–34.0)
MCHC: 33.6 g/dL (ref 32.0–36.0)
MCV: 88 fL (ref 80.0–100.0)
Platelets: 195 10*3/uL (ref 150–440)
RBC: 3.04 MIL/uL — ABNORMAL LOW (ref 3.80–5.20)
RDW: 14.4 % (ref 11.5–14.5)
WBC: 7.1 10*3/uL (ref 3.6–11.0)

## 2017-03-29 LAB — COMPREHENSIVE METABOLIC PANEL
ALBUMIN: 3 g/dL — AB (ref 3.5–5.0)
ALT: 8 U/L — ABNORMAL LOW (ref 14–54)
ANION GAP: 11 (ref 5–15)
AST: 25 U/L (ref 15–41)
Alkaline Phosphatase: 56 U/L (ref 38–126)
BUN: 35 mg/dL — ABNORMAL HIGH (ref 6–20)
CHLORIDE: 105 mmol/L (ref 101–111)
CO2: 28 mmol/L (ref 22–32)
Calcium: 9.7 mg/dL (ref 8.9–10.3)
Creatinine, Ser: 1.16 mg/dL — ABNORMAL HIGH (ref 0.44–1.00)
GFR calc Af Amer: 49 mL/min — ABNORMAL LOW (ref >60)
GFR, EST NON AFRICAN AMERICAN: 42 mL/min — AB (ref >60)
GLUCOSE: 81 mg/dL (ref 65–99)
POTASSIUM: 3 mmol/L — AB (ref 3.5–5.1)
Sodium: 144 mmol/L (ref 135–145)
TOTAL PROTEIN: 7.4 g/dL (ref 6.5–8.1)
Total Bilirubin: 1 mg/dL (ref 0.3–1.2)

## 2017-03-29 LAB — SEDIMENTATION RATE: Sed Rate: >140 mm/hr — ABNORMAL HIGH (ref 0–30)

## 2017-03-29 LAB — MAGNESIUM: MAGNESIUM: 1.7 mg/dL (ref 1.7–2.4)

## 2017-03-29 LAB — POTASSIUM: Potassium: 3.7 mmol/L (ref 3.5–5.1)

## 2017-03-29 MED ORDER — PANTOPRAZOLE SODIUM 40 MG PO TBEC
40.0000 mg | DELAYED_RELEASE_TABLET | Freq: Every day | ORAL | Status: DC
  Administered 2017-03-30: 40 mg via ORAL
  Filled 2017-03-29 (×2): qty 1

## 2017-03-29 MED ORDER — POTASSIUM CHLORIDE 20 MEQ/15ML (10%) PO SOLN
40.0000 meq | Freq: Once | ORAL | Status: DC
  Filled 2017-03-29: qty 30

## 2017-03-29 MED ORDER — POTASSIUM CHLORIDE 10 MEQ/100ML IV SOLN
10.0000 meq | INTRAVENOUS | Status: AC
  Administered 2017-03-29 (×4): 10 meq via INTRAVENOUS
  Filled 2017-03-29 (×4): qty 100

## 2017-03-29 MED ORDER — LOSARTAN POTASSIUM 50 MG PO TABS
100.0000 mg | ORAL_TABLET | Freq: Every day | ORAL | Status: DC
  Administered 2017-03-30: 100 mg via ORAL
  Filled 2017-03-29 (×2): qty 2

## 2017-03-29 MED ORDER — ATENOLOL 50 MG PO TABS
25.0000 mg | ORAL_TABLET | Freq: Every day | ORAL | Status: DC
  Administered 2017-03-30: 25 mg via ORAL
  Filled 2017-03-29 (×2): qty 1

## 2017-03-29 MED ORDER — VANCOMYCIN HCL IN DEXTROSE 750-5 MG/150ML-% IV SOLN
750.0000 mg | INTRAVENOUS | Status: DC
  Filled 2017-03-29 (×2): qty 150

## 2017-03-29 MED ORDER — NYSTATIN 100000 UNIT/ML MT SUSP
5.0000 mL | Freq: Four times a day (QID) | OROMUCOSAL | Status: DC
  Administered 2017-03-29 – 2017-03-30 (×3): 500000 [IU] via ORAL
  Filled 2017-03-29 (×3): qty 5

## 2017-03-29 MED ORDER — AMLODIPINE BESYLATE 10 MG PO TABS
10.0000 mg | ORAL_TABLET | Freq: Every day | ORAL | Status: DC
  Administered 2017-03-30: 10 mg via ORAL
  Filled 2017-03-29 (×2): qty 1

## 2017-03-29 MED ORDER — MAGNESIUM SULFATE IN D5W 1-5 GM/100ML-% IV SOLN
1.0000 g | Freq: Once | INTRAVENOUS | Status: AC
  Administered 2017-03-29: 1 g via INTRAVENOUS
  Filled 2017-03-29: qty 100

## 2017-03-29 NOTE — Plan of Care (Signed)
Problem: SLP Dysphagia Goals Goal: Misc Dysphagia Goal Pt will safely tolerate po diet of least restrictive consistency w/ no overt s/s of aspiration noted by Staff/pt/family x3 sessions.    

## 2017-03-29 NOTE — Care Management Note (Signed)
Case Management Note  Patient Details  Name: WENDIE DISKIN MRN: [id number] Date of Birth: 1931-06-24  Subjective/Objective:   Patient receives PCS services from 9-12 am 7 days per week. She is active with [facility] home health  for RN, PT, OT. Will need resumption of care orders.                Action/Plan: Following progression.   Expected Discharge Date:                  Expected Discharge Plan:  [place]  In-House Referral:     Discharge planning Services  CM Consult  Post Acute Care Choice:  Resumption of Svcs/PTA Provider, Home Health Choice offered to:     DME Arranged:    DME Agency:     HH Arranged:  RN, PT, OT [facility] Agency:     Status of Service:  In process, will continue to follow  If discussed at Long Length of Stay Meetings, dates discussed:    Additional Comments:  Jolly Mango, RN 03/29/2017, 12:05 PM

## 2017-03-29 NOTE — Consult Note (Signed)
Pharmacy Antibiotic Note  [name] is a 83 y.o. female admitted on 03/28/2017 with  sepsis secondary to bilateral foot ulcers.  Pharmacy has been consulted for vancomycin and Zosyn dosing. Patient received vancomycin 1gm IV and Ceftriaxone 1gm IV in ED.  Plan: Patient was orginally ordered vancomycin 1250mg  q 36 hours based off of a weight of 160lb entered into the computer in the ED. Upon arrival to the floor, weight updated to 123lb (56.2kg). Will need to change dose based off of accurate weight and change in renal function. Will change dose to vancomycin 750mg  q 24 hours starting 36 hours after last dose. Continue zosyn 3.375g q 8hr EI infusion. Trough prior to the 4th new dose 6/16 @ 1130  Weight: 123 lb 12.8 oz (56.2 kg) (RN notified about the difference in weight.)  Temp (24hrs), Avg:98.4 F (36.9 C), Min:98 F (36.7 C), Max:98.7 F (37.1 C)   Recent Labs Lab 03/28/17 1343 03/29/17 0500  WBC 8.5 7.1  CREATININE 1.41* 1.16*  LATICACIDVEN 1.2  --     Estimated Creatinine Clearance: 30.4 mL/min (A) (by C-G formula based on SCr of 1.16 mg/dL (H)).    No Known Allergies  Antimicrobials this admission: 6/11 ceftriaxone one dose 6/11 Zosyn  >>  6/11 Vancomycin >>   Dose adjustments this admission: Vancomycin 1250 q 36 to vancomycin 750mg  q 24  Microbiology results: 6/11 BCx: NG <24hr 6/11 UCx: sent   Thank you for allowing pharmacy to be a part of this patient's care.  Ramond Dial, Pharm.D, BCPS Clinical Pharmacist  03/29/2017 10:56 AM

## 2017-03-29 NOTE — Consult Note (Signed)
MEDICATION RELATED CONSULT NOTE - INITIAL   Pharmacy Consult for electrolytes Indication: hypokalemia  No Known Allergies  Patient Measurements: Weight: 123 lb 12.8 oz (56.2 kg) (RN notified about the difference in weight.) Adjusted Body Weight:   Vital Signs: Temp: 99.7 F (37.6 C) (06/12 1545) Temp Source: Oral (06/12 1545) BP: 129/58 (06/12 1545) Pulse Rate: 84 (06/12 1545) Intake/Output from previous day: 06/11 0701 - 06/12 0700 In: 2236.3 [I.V.:836.3; IV Piggyback:1400] Out: 1500 [Urine:1500] Intake/Output from this shift: No intake/output data recorded.  Labs:  Recent Labs  03/28/17 1343 03/29/17 0500  WBC 8.5 7.1  HGB 9.8* 9.0*  HCT 29.4* 26.7*  PLT 215 195  CREATININE 1.41* 1.16*  MG  --  1.7  ALBUMIN 3.4* 3.0*  PROT 8.3* 7.4  AST 29 25  ALT 10* 8*  ALKPHOS 63 56  BILITOT 1.1 1.0   Estimated Creatinine Clearance: 30.4 mL/min (A) (by C-G formula based on SCr of 1.16 mg/dL (H)).   Microbiology: Recent Results (from the past 720 hour(s))  Aerobic Culture (superficial specimen)     Status: None   Collection Time: 03/04/17 12:12 PM  Result Value Ref Range Status   Specimen Description HEEL LEFT HEEL  Final   Special Requests NONE  Final   Gram Stain   Final    NO WBC SEEN MODERATE GRAM POSITIVE COCCI IN PAIRS FEW GRAM NEGATIVE RODS RARE GRAM POSITIVE RODS Performed at [facility], [place] [address], [place], [place] [address]    Culture   Final    MODERATE ESCHERICHIA COLI MODERATE PROTEUS MIRABILIS    Report Status 03/07/2017 FINAL  Final   Organism ID, Bacteria ESCHERICHIA COLI  Final   Organism ID, Bacteria PROTEUS MIRABILIS  Final      Susceptibility   Escherichia coli - MIC*    AMPICILLIN 4 SENSITIVE Sensitive     CEFAZOLIN <=4 SENSITIVE Sensitive     CEFEPIME <=1 SENSITIVE Sensitive     CEFTAZIDIME <=1 SENSITIVE Sensitive     CEFTRIAXONE <=1 SENSITIVE Sensitive     CIPROFLOXACIN <=0.25 SENSITIVE Sensitive     GENTAMICIN <=1  SENSITIVE Sensitive     IMIPENEM <=0.25 SENSITIVE Sensitive     TRIMETH/SULFA <=20 SENSITIVE Sensitive     AMPICILLIN/SULBACTAM <=2 SENSITIVE Sensitive     PIP/TAZO <=4 SENSITIVE Sensitive     Extended ESBL NEGATIVE Sensitive     * MODERATE ESCHERICHIA COLI   Proteus mirabilis - MIC*    AMPICILLIN <=2 SENSITIVE Sensitive     CEFAZOLIN <=4 SENSITIVE Sensitive     CEFEPIME <=1 SENSITIVE Sensitive     CEFTAZIDIME <=1 SENSITIVE Sensitive     CEFTRIAXONE <=1 SENSITIVE Sensitive     CIPROFLOXACIN <=0.25 SENSITIVE Sensitive     GENTAMICIN <=1 SENSITIVE Sensitive     IMIPENEM 2 SENSITIVE Sensitive     TRIMETH/SULFA <=20 SENSITIVE Sensitive     AMPICILLIN/SULBACTAM <=2 SENSITIVE Sensitive     PIP/TAZO <=4 SENSITIVE Sensitive     * MODERATE PROTEUS MIRABILIS  Blood Culture (routine x 2)     Status: None (Preliminary result)   Collection Time: 03/28/17  1:43 PM  Result Value Ref Range Status   Specimen Description BLOOD LEFT ANTECUBITAL  Final   Special Requests   Final    BOTTLES DRAWN AEROBIC AND ANAEROBIC Blood Culture adequate volume   Culture NO GROWTH < 24 HOURS  Final   Report Status PENDING  Incomplete  Blood Culture (routine x 2)     Status:  None (Preliminary result)   Collection Time: 03/28/17  2:17 PM  Result Value Ref Range Status   Specimen Description BLOOD RESISTANT ARM  Final   Special Requests   Final    BOTTLES DRAWN AEROBIC AND ANAEROBIC Blood Culture adequate volume   Culture NO GROWTH < 24 HOURS  Final   Report Status PENDING  Incomplete    Medical History: Past Medical History:  Diagnosis Date  . Anemia   . Chronic kidney disease, stage 3   . Degenerative joint disease (DJD) of lumbar spine   . Diastolic dysfunction   . Dizziness   . Edema   . ETD (eustachian tube dysfunction)   . Heartburn   . History of rectal bleeding   . HTN (hypertension)   . Hypercalcemia   . Intracranial aneurysm   . Low vision, one eye   . Nephritis and nephropathy   .  Osteoporosis   . Pyuria   . Urge incontinence   . Urinary frequency   . Urinary retention     Medications:  Scheduled:  . acetaminophen (TYLENOL) oral liquid 160 mg/5 mL  1,000 mg Oral Once  . amLODipine  10 mg Oral Daily  . atenolol  25 mg Oral Daily  . enoxaparin (LOVENOX) injection  30 mg Subcutaneous Q24H  . losartan  100 mg Oral Daily  . nystatin  5 mL Oral QID  . pantoprazole  40 mg Oral Daily    Assessment: Patient is a 83 year old female who presents with sepsis of unknown origin. Pt with a K of 3.0 this AM dispite NaCl w/ 20 MEQ KCL running at 82ml/hr  (1.5 MEQ/hr) since 1900 yesterday. Mg is WNL @ 1.7  Goal of Therapy:  normalization of electrolyes  Plan:  0612 1800 K+: 3.7. Potassium wnl. No replacement needed at this time.  BMP ordered with AM labs   Pernell Dupre, PharmD, BCPS Clinical Pharmacist 03/29/2017 6:41 PM

## 2017-03-29 NOTE — Evaluation (Signed)
Clinical/Bedside Swallow Evaluation Patient Details  Name: [name] MRN: [id number] Date of Birth: 11/08/1930  Today's Date: 03/29/2017 Time: SLP Start Time (ACUTE ONLY): 0930 SLP Stop Time (ACUTE ONLY): 1030 SLP Time Calculation (min) (ACUTE ONLY): 60 min  Past Medical History:  Past Medical History:  Diagnosis Date  . Anemia   . Chronic kidney disease, stage 3   . Degenerative joint disease (DJD) of lumbar spine   . Diastolic dysfunction   . Dizziness   . Edema   . ETD (eustachian tube dysfunction)   . Heartburn   . History of rectal bleeding   . HTN (hypertension)   . Hypercalcemia   . Intracranial aneurysm   . Low vision, one eye   . Nephritis and nephropathy   . Osteoporosis   . Pyuria   . Urge incontinence   . Urinary frequency   . Urinary retention    Past Surgical History:  Past Surgical History:  Procedure Laterality Date  . dilation of esophageal web    . FRACTURE SURGERY Left    femur  . IR GENERIC HISTORICAL  08/05/2016   IR CATHETER TUBE CHANGE 08/05/2016 [name], MD [facility]  . IR GENERIC HISTORICAL  09/17/2016   IR CATHETER TUBE CHANGE 09/17/2016 [facility]  . IR GENERIC HISTORICAL  11/19/2016   IR CATHETER TUBE CHANGE 11/19/2016 [name], MD [facility]  . IR GENERIC HISTORICAL  11/29/2016   IR CATHETER TUBE CHANGE 11/29/2016 [name], DO [facility]  . JOINT REPLACEMENT Bilateral   . TOTAL KNEE ARTHROPLASTY Bilateral   . VAGINAL HYSTERECTOMY  1967   ovaries also removed because of fibroida   HPI:  Pt is a 83 y.o. female with a known history of Dementia, GERD, urinary retention, diastolic dysfunction, CKD stage III, bedbound status since femur fracture presents to the hospital brought in by sister due to acute onset of dysphagia. Patient also has had a cough, weakness. Has not eaten or drank anything for 3 days. Has trouble with solids and liquids. This was acute onset. No prior problems with swallowing. With EMS patient  had febrile 100.8. Has bilateral feet ulcers. Chest x-ray clear. She has a past medical history of dementia per MD note at admission. Pt does have caregivers to assist in ADLs at home. Currently, pt is verbally responsive but w/ noted min confusion in communication/responses.    Assessment / Plan / Recommendation Clinical Impression  Pt appears to present w/ min Oral phase dyspahgia possibly impacted by declined Cognitive status/Dementia(baseline dx per MD note). This can increase (min) risk for aspiration d/t the impact of the oral phase on the pharyngeal phase of swallowing. Pt fairly consistently exhibited behavior of taking small, small sips or bites at a time w/ increased lingual sweeping/clearing movements after swallowing - appeared related to declined Cognitive status oral behavior(?). But also of note was the expectoration of moderate (Esophageal?) phlegm post trials. Pt indicated this phlegm was making her "full in her throat" and "not hungry". Due to the suspected oral Thrush, pt could have impact of the Thrush in her pharynx/Esophagus along w/ Reflux resulting in increased, stringy phlegm. This could significantly impact any desire for oral intake. MD consulted and agreed w/ initiation of tx for Thrush and a PPI. SLP recommends a Dysphagia level 1(puree) diet w/ thin liquids; general aspiration precautions; Reflux precautions; Pills in Puree - Crushed.  SLP Visit Diagnosis: Dysphagia, oral phase (R13.11)    Aspiration Risk  Mild aspiration risk (but  reduced w/ general aspiration precautions)    Diet Recommendation  Dysphagia level 1 (Puree) w/ thin liquids; general aspiration precautions; Reflux precautions.  Treatment for Thrush; Dietician f/u for nutritional supplements  Medication Administration: Crushed with puree    Other  Recommendations Recommended Consults:  (Dietician f/u) Oral Care Recommendations: Oral care BID;Staff/trained caregiver to provide oral care   Follow up  Recommendations None (TBD)      Frequency and Duration min 2x/week  1 week       Prognosis Prognosis for Safe Diet Advancement: Good Barriers to Reach Goals: Cognitive deficits      Swallow Study   General Date of Onset: 03/28/17 HPI: Pt is a 83 y.o. female with a known history of Dementia, GERD, urinary retention, diastolic dysfunction, CKD stage III, bedbound status since femur fracture presents to the hospital brought in by sister due to acute onset of dysphagia. Patient also has had a cough, weakness. Has not eaten or drank anything for 3 days. Has trouble with solids and liquids. This was acute onset. No prior problems with swallowing. With EMS patient had febrile 100.8. Has bilateral feet ulcers. Chest x-ray clear. She has a past medical history of dementia per MD note at admission. Pt does have caregivers to assist in ADLs at home. Currently, pt is verbally responsive but w/ noted min confusion in communication/responses.  Type of Study: Bedside Swallow Evaluation Previous Swallow Assessment: none indicated Diet Prior to this Study: Regular;Thin liquids (NPO at admission though) Temperature Spikes Noted: No (wbc 7.1) Respiratory Status: Room air History of Recent Intubation: No Behavior/Cognition: Alert;Cooperative;Pleasant mood;Confused;Distractible;Requires cueing Oral Cavity Assessment:  (white, thick coating on tongue; sticky) Oral Care Completed by SLP: Yes Oral Cavity - Dentition: Edentulous Vision: Functional for self-feeding Self-Feeding Abilities: Able to feed self;Needs assist;Needs set up (cues to attend) Patient Positioning: Upright in bed Baseline Vocal Quality: Low vocal intensity;Normal Volitional Cough: Strong (wfl) Volitional Swallow: Able to elicit    Oral/Motor/Sensory Function Overall Oral Motor/Sensory Function: Within functional limits (no overt weakness noted)   Ice Chips Ice chips: Within functional limits Presentation: Spoon (fed; 3 trials)   Thin  Liquid Thin Liquid: Impaired Presentation: Cup;Self Fed;Straw (7-8 trials total) Oral Phase Impairments:  (none) Oral Phase Functional Implications: Prolonged oral transit (min) Pharyngeal  Phase Impairments:  (none) Other Comments: small, small sips at a time w/ increased lingual sweeping/clearing movements after swallowing - appeared related to declined Cognitive status oral behavior(?)    Nectar Thick Nectar Thick Liquid: Not tested   Honey Thick Honey Thick Liquid: Not tested   Puree Puree: Impaired Presentation: Spoon;Self Fed (7-8 trials total) Oral Phase Impairments:  (none) Oral Phase Functional Implications: Prolonged oral transit (min) Pharyngeal Phase Impairments:  (none) Other Comments: small, small bites at a time w/ increased lingual sweeping/clearing movements after swallowing - appeared related to declined Cognitive status oral behavior(?)   Solid   GO   Solid: Not tested Other Comments: d/t oral phase behavior         [name], MS, CCC-SLP Watson,Katherine 03/29/2017,3:06 PM

## 2017-03-29 NOTE — Consult Note (Signed)
Patient Demographics  [name], is a 83 y.o. female   MRN: [id number]   DOB - 1930-12-02  Admit Date - 03/28/2017    Outpatient Primary MD for the patient is [name], MD  Consult requested in the Hospital by Bettey Costa, MD, On 03/29/2017    Reason for consult decubitus ulcers bilateral heels left worse than right   With History of -  Past Medical History:  Diagnosis Date  . Anemia   . Chronic kidney disease, stage 3   . Degenerative joint disease (DJD) of lumbar spine   . Diastolic dysfunction   . Dizziness   . Edema   . ETD (eustachian tube dysfunction)   . Heartburn   . History of rectal bleeding   . HTN (hypertension)   . Hypercalcemia   . Intracranial aneurysm   . Low vision, one eye   . Nephritis and nephropathy   . Osteoporosis   . Pyuria   . Urge incontinence   . Urinary frequency   . Urinary retention       Past Surgical History:  Procedure Laterality Date  . dilation of esophageal web    . FRACTURE SURGERY Left    femur  . IR GENERIC HISTORICAL  08/05/2016   IR CATHETER TUBE CHANGE 08/05/2016 [name], MD [facility]  . IR GENERIC HISTORICAL  09/17/2016   IR CATHETER TUBE CHANGE 09/17/2016 [facility]  . IR GENERIC HISTORICAL  11/19/2016   IR CATHETER TUBE CHANGE 11/19/2016 [name], MD [facility]  . IR GENERIC HISTORICAL  11/29/2016   IR CATHETER TUBE CHANGE 11/29/2016 [name], DO [facility]  . JOINT REPLACEMENT Bilateral   . TOTAL KNEE ARTHROPLASTY Bilateral   . VAGINAL HYSTERECTOMY  1967   ovaries also removed because of fibroida    in for   Chief Complaint  Patient presents with  . Weakness     HPI  [name]  is a 83 y.o. female, Patient broke femur at some point in the recent past and has been nonambulatory since that time  frame is been bedridden. She developed bilateral decubitus ulcerations she is currently in a skilled facility. She has boots on both heels and a very thin dressing on both heels. Apparently she been followed at the wound care center and she is worsened.    Review of Systems    In addition to the HPI above,  No Fever-chills, No Headache, No changes with Vision or hearing, No problems swallowing food or Liquids, No Chest pain, Cough or Shortness of Breath, No Abdominal pain, No Nausea or Vommitting, Bowel movements are regular, No Blood in stool or Urine, No dysuria, No new skin rashes or bruises, No new joints pains-aches,  No new weakness, tingling, numbness in any extremity, No recent weight gain or loss, No polyuria, polydypsia or polyphagia, No significant Mental Stressors.  A full 10 point Review of Systems was done, except as stated above, all other Review of Systems were negative.   Social History Social History  Substance Use Topics  . Smoking status: Former Smoker    Quit date: 07/23/1964  . Smokeless tobacco: Never Used     Comment:  quit 1965  . Alcohol use No    Family History Family History  Problem Relation Age of Onset  . Kidney disease Neg Hx   . Bladder Cancer Neg Hx     Prior to Admission medications   Medication Sig Start Date End Date Taking? Authorizing Provider  acetaminophen (TYLENOL) 325 MG tablet Take 650 mg by mouth 2 (two) times daily as needed.   Yes [provider]  amLODipine (NORVASC) 10 MG tablet TAKE ONE TABLET BY MOUTH EVERY DAY HIGH BLOOD PRESSURE 12/13/16  Yes [provider]  amoxicillin-clavulanate (AUGMENTIN) 875-125 MG tablet Take 1 tablet by mouth 2 (two) times daily. 03/23/17 04/02/17 Yes [provider]  atenolol (TENORMIN) 25 MG tablet Take 1 tablet (25 mg total) by mouth daily. 06/10/16  Yes Mody, Ulice Bold, MD  gabapentin (NEURONTIN) 300 MG capsule Take 300 mg by mouth 3 (three) times daily.   Yes [provider]  losartan (COZAAR) 100 MG tablet Take 1 tablet (100 mg total) by mouth daily. 06/10/16  Yes Mody, Ulice Bold, MD  mirabegron ER (MYRBETRIQ) 25 MG TB24 tablet Take 1 tablet (25 mg total) by mouth daily. 02/16/17  Yes Hollice Espy, MD  Multiple Vitamin (MULTIVITAMIN) tablet Take 1 tablet by mouth daily. 06/10/16  Yes Mody, Ulice Bold, MD  oxybutynin (DITROPAN) 5 MG tablet Take 5 mg by mouth 2 (two) times daily. 12/04/16  Yes [provider]    Anti-infectives    Start     Dose/Rate Route Frequency Ordered Stop   03/30/17 1200  vancomycin (VANCOCIN) IVPB 750 mg/150 ml premix     750 mg 150 mL/hr over 60 Minutes Intravenous Every 24 hours 03/29/17 1056     03/29/17 0000  vancomycin (VANCOCIN) 1,250 mg in sodium chloride 0.9 % 250 mL IVPB  Status:  Discontinued     1,250 mg 166.7 mL/hr over 90 Minutes Intravenous Every 36 hours 03/28/17 1739 03/29/17 1056   03/28/17 2200  piperacillin-tazobactam (ZOSYN) IVPB 3.375 g     3.375 g 12.5 mL/hr over 240 Minutes Intravenous Every 8 hours 03/28/17 1653     03/28/17 1700  vancomycin (VANCOCIN) IVPB 1000 mg/200 mL premix     1,000 mg 200 mL/hr over 60 Minutes Intravenous  Once 03/28/17 1652 03/28/17 1817   03/28/17 1400  cefTRIAXone (ROCEPHIN) 1 g in dextrose 5 % 50 mL IVPB     1 g 100 mL/hr over 30 Minutes Intravenous  Once 03/28/17 1346 03/28/17 1504      Scheduled Meds: . acetaminophen (TYLENOL) oral liquid 160 mg/5 mL  1,000 mg Oral Once  . amLODipine  10 mg Oral Daily  . atenolol  25 mg Oral Daily  . enoxaparin (LOVENOX) injection  30 mg Subcutaneous Q24H  . losartan  100 mg Oral Daily  . nystatin  5 mL Oral QID  . pantoprazole  40 mg Oral Daily   Continuous Infusions: . 0.9 % NaCl with KCl 20 mEq / L 75 mL/hr at 03/28/17 1930  . piperacillin-tazobactam (ZOSYN)  IV 3.375 g (03/29/17 1456)  . [START ON 03/30/2017] vancomycin     PRN Meds:.acetaminophen **OR** acetaminophen, albuterol, bisacodyl, hydrALAZINE, morphine  injection, ondansetron **OR** ondansetron (ZOFRAN) IV  No Known Allergies  Physical Exam  Vitals  Blood pressure (!) 129/58, pulse 84, temperature 99.7 F (37.6 C), temperature source Oral, resp. rate 18, weight 56.2 kg (123 lb 12.8 oz), SpO2 100 %.  Lower Extremity exam:  Vascular:Pulses are diminished but palpable bilaterally.  Dermatological: Patient has  a large decubitus on the posterior medial left heel. Likely in this region because flex like she has a inversion deformity to her leg creating a tendency for to put pressure on the bed on that portion of the heel. Wound itself is approximately 4 x 4.5 cm there is some superficial exudate on the wound but with cleaning this area off it appears that there is some granulation tissue underneath it and does not appear to have bone exposure. The area was probed and does not appear to be tracking. I will culture that superficial drainage today. Data Review  CBC  Recent Labs Lab 03/28/17 1343 03/29/17 0500  WBC 8.5 7.1  HGB 9.8* 9.0*  HCT 29.4* 26.7*  PLT 215 195  MCV 88.6 88.0  MCH 29.5 29.5  MCHC 33.3 33.6  RDW 14.5 14.4  LYMPHSABS 1.6  --   MONOABS 0.8  --   EOSABS 0.0  --   BASOSABS 0.1  --    ------------------------------------------------------------------------------------------------------------------  Chemistries   Recent Labs Lab 03/28/17 1343 03/29/17 0500  NA 141 144  K 3.2* 3.0*  CL 101 105  CO2 25 28  GLUCOSE 80 81  BUN 56* 35*  CREATININE 1.41* 1.16*  CALCIUM 10.1 9.7  MG  --  1.7  AST 29 25  ALT 10* 8*  ALKPHOS 63 56  BILITOT 1.1 1.0   ----------------------------------------------------------------------------------  Assessment & Plan: X-rays were negative for any type of osseous involvement at this timeframe. This gels with the clinical findings. Likely has a superficial contamination and superficial infection across the region. Hopefully be managed with the antibiotics. I will get a culture  of that today. This on the left heel. The right heel is fairly stable and fairly small and solidly 5 mm x 5 mm diameter with a couple millimeters in depth no evidence of infection to the region. That'll likely sepsis coming from the heel area. White count was within normal limits today. Plan I get a culture on that wound today and on the left foot. I will get her with a wet-to-dry saline dressing with increased padding and then the pressure relief with Kerlix dressings and Kerlix wrap. She continues to use her heel protector boots. I don't think she really needs a surgical approach at this point. I think she needs better palliative care to the region and more pressure relief. Wet-to-dry saline dressings will help remove debris from the area better than previous dressing she's had on. These need to be changed daily. I will await culture results also.  Active Problems:   Sepsis [facility])   Family Communication: Plan discussed with patient    Perry Mount M.D on 03/29/2017 at 5:50 PM  Thank you for the consult, we will follow the patient with you in the Hospital.

## 2017-03-29 NOTE — Progress Notes (Signed)
[place] [facility] [place] NAME: [name]    MR#:  [id number]  DATE OF BIRTH:  April 17, 1931  SUBJECTIVE:   Patient here with difficulty swallowing liquids and solids and found to have heel ulcer  REVIEW OF SYSTEMS:    Review of Systems  Constitutional: Negative for fever, chills weight loss HENT: Negative for ear pain, nosebleeds, congestion, facial swelling, rhinorrhea, neck pain, neck stiffness and ear discharge.   Respiratory: Negative for cough, shortness of breath, wheezing  Cardiovascular: Negative for chest pain, palpitations and leg swelling.  Gastrointestinal: Negative for heartburn, abdominal pain, vomiting, diarrhea or consitpation Difficulty swallowing Genitourinary: Negative for dysuria, urgency, frequency, hematuria Musculoskeletal: Negative for back pain or joint pain She is bedbound Neurological: Negative for dizziness, seizures, syncope, focal weakness,  numbness and headaches.  Hematological: Does  bruise/bleed easily.  Psychiatric/Behavioral: Negative for hallucinations, confusion, dysphoric mood Skin: Heel ulcers   Tolerating Diet: npo      DRUG ALLERGIES:  No Known Allergies  VITALS:  Blood pressure 122/61, pulse 82, temperature 98.7 F (37.1 C), temperature source Axillary, resp. rate 18, weight 56.2 kg (123 lb 12.8 oz), SpO2 100 %.  PHYSICAL EXAMINATION:  Constitutional: Appears well-developed and well-nourished. No distress. HENT: Normocephalic. Marland Kitchen Oropharynx is clear and moist.  Eyes: Conjunctivae and EOM are normal. PERRLA, no scleral icterus.  Neck: Normal ROM. Neck supple. No JVD. No tracheal deviation. CVS: RRR, S1/S2 +, no murmurs, no gallops, no carotid bruit.  Pulmonary: Effort and breath sounds normal, no stridor, rhonchi, wheezes, rales.  Abdominal: Soft. BS +,  no distension, tenderness, rebound or guarding.  Musculoskeletal: Normal range of motion. No edema and no tenderness.  Neuro: Alert. CN  2-12 grossly intact. No focal deficits. Skin: Open pressure ulcer both heels with malodorous drainage . Psychiatric: Normal mood and affect.      LABORATORY PANEL:   CBC  Recent Labs Lab 03/29/17 0500  WBC 7.1  HGB 9.0*  HCT 26.7*  PLT 195   ------------------------------------------------------------------------------------------------------------------  Chemistries   Recent Labs Lab 03/29/17 0500  NA 144  K 3.0*  CL 105  CO2 28  GLUCOSE 81  BUN 35*  CREATININE 1.16*  CALCIUM 9.7  MG 1.7  AST 25  ALT 8*  ALKPHOS 56  BILITOT 1.0   ------------------------------------------------------------------------------------------------------------------  Cardiac Enzymes  Recent Labs Lab 03/28/17 1343  TROPONINI 0.09*   ------------------------------------------------------------------------------------------------------------------  RADIOLOGY:  Ct Head Wo Contrast  Result Date: 03/28/2017 CLINICAL DATA:  Code sepsis, altered mental status EXAM: CT HEAD WITHOUT CONTRAST TECHNIQUE: Contiguous axial images were obtained from the base of the skull through the vertex without intravenous contrast. COMPARISON:  MRI brain dated 06/09/2016. FINDINGS: Brain: No evidence of acute infarction, hemorrhage, hydrocephalus, extra-axial collection or mass lesion/mass effect. Subcortical white matter and periventricular small vessel ischemic changes. Vascular: Intracranial atherosclerosis. Skull: Normal. Negative for fracture or focal lesion. Sinuses/Orbits: The visualized paranasal sinuses are essentially clear. The mastoid air cells are unopacified. Other: None. IMPRESSION: No evidence of acute intracranial abnormality. Small vessel ischemic changes. Electronically Signed   By: Julian Hy M.D.   On: 03/28/2017 14:48   Mr Brain Wo Contrast  Result Date: 03/28/2017 CLINICAL DATA:  Dysphagia EXAM: MRI HEAD WITHOUT CONTRAST TECHNIQUE: Multiplanar, multiecho pulse sequences of the  brain and surrounding structures were obtained without intravenous contrast. COMPARISON:  Head CT 03/28/2017 Brain MRI 06/09/2016 FINDINGS: Brain: The midline structures are normal. There is no focal diffusion restriction to indicate acute infarct. There  is confluent hyperintense T2-weighted signal within the periventricular white matter, most often seen in the setting of chronic microvascular ischemia. No intraparenchymal hematoma or chronic microhemorrhage. Brain volume is normal for age without age-advanced or lobar predominant atrophy. The dura is normal and there is no extra-axial collection. Vascular: There is an aneurysm of the right MCA bifurcation, measuring approximately 7 mm, unchanged. Skull and upper cervical spine: The visualized skull base, calvarium, upper cervical spine and extracranial soft tissues are normal. Sinuses/Orbits: No fluid levels or advanced mucosal thickening. No mastoid effusion. Normal orbits. IMPRESSION: 1. No acute intracranial abnormality. 2. Confluent bilateral periventricular white matter disease, most commonly indicating chronic microvascular ischemia. 3. 7 mm right MCA bifurcation aneurysm, unchanged. Electronically Signed   By: Ulyses Jarred M.D.   On: 03/28/2017 21:59   Dg Chest Port 1 View  Result Date: 03/28/2017 CLINICAL DATA:  Difficulty swallowing 3 months.  Possible sepsis. EXAM: PORTABLE CHEST 1 VIEW COMPARISON:  06/08/2016 FINDINGS: Lungs are adequately inflated without focal airspace consolidation or effusion. Subtle density of the lateral left lower thorax likely bony in origin as there are adjacent old rib fractures. Cardiomediastinal silhouette is within normal. There is calcified plaque over the aortic arch. Remaining bones and soft tissues are unchanged. IMPRESSION: No acute cardiopulmonary disease. Focal density over the lateral left thorax likely bony in origin related to adjacent rib fractures. Consider follow-up chest radiograph 3 months. Aortic  atherosclerosis. Electronically Signed   By: Marin Olp M.D.   On: 03/28/2017 14:32   Dg Foot Complete Left  Result Date: 03/28/2017 CLINICAL DATA:  83 year old female with a history of foot pain and an open wound EXAM: LEFT FOOT - COMPLETE 3+ VIEW COMPARISON:  09/17/2016 FINDINGS: Advanced osteopenia, similar to prior. No acute displaced fracture, although the sensitivity for the detection of nondisplaced fractures is limited by the osteopenia. No radiopaque foreign body. No focal soft tissue swelling with diffuse soft tissue swelling of the forefoot and hindfoot. No definite erosive changes. Degenerative changes of the interphalangeal joints. IMPRESSION: No acute bony abnormality or evidence of osteomyelitis, however, osteopenia limits the sensitivity of the exam for both conditions. Degenerative changes of the interphalangeal joints. Electronically Signed   By: [name] D.O.   On: 03/28/2017 17:01     ASSESSMENT AND PLAN:   83 year old female with chronic diastolic dysfunction, chronic kidney disease stage III and bedbound status since femur fracture who presented initially for dysphagia to the emergency room.  1. Bilateral foot ulcers with purulent malodorous drainage left foot: Podiatry consultation Continue Zosyn and vancomycin Appreciate wound care consult  2. Dysphagia: Await speech evaluation MRI shows no evidence of CVA   3. Acute kidney injury due to decreased oral intake: Creatinine has improved with IV fluids  4. Hypokalemia: This is been repleted Repeat in a.m.   5. Chronic diastolic heart failure without exacerbation   6. Essential hypertension: Continue Norvasc, atenolol and losartan       Management plans discussed with the patient and she is in agreement.  CODE STATUS: dnr  TOTAL TIME TAKING CARE OF THIS PATIENT: 30 minutes.     POSSIBLE D/C 2-4 days, DEPENDING ON CLINICAL CONDITION.   Javyon Fontan M.D on 03/29/2017 at 9:29 AM  Between 7am to 6pm  - Pager - [phone] After 6pm go to [email or web address] - password [email or web address] [facility]  Office  [phone]  CC: Primary care physician; [name], MD  Note: This dictation was prepared with Dragon dictation along with  smaller phrase technology. Any transcriptional errors that result from this process are unintentional. 

## 2017-03-29 NOTE — Consult Note (Signed)
MEDICATION RELATED CONSULT NOTE - INITIAL   Pharmacy Consult for electrolytes Indication: hypokalemia  No Known Allergies  Patient Measurements: Weight: 123 lb 12.8 oz (56.2 kg) (RN notified about the difference in weight.) Adjusted Body Weight:   Vital Signs: Temp: 98.3 F (36.8 C) (06/11 2351) Temp Source: Oral (06/11 2351) BP: 128/63 (06/11 2351) Pulse Rate: 79 (06/11 2351) Intake/Output from previous day: 06/11 0701 - 06/12 0700 In: 2236.3 [I.V.:836.3; IV Piggyback:1400] Out: 1500 [Urine:1500] Intake/Output from this shift: No intake/output data recorded.  Labs:  Recent Labs  03/28/17 1343 03/29/17 0500  WBC 8.5 7.1  HGB 9.8* 9.0*  HCT 29.4* 26.7*  PLT 215 195  CREATININE 1.41* 1.16*  MG  --  1.7  ALBUMIN 3.4* 3.0*  PROT 8.3* 7.4  AST 29 25  ALT 10* 8*  ALKPHOS 63 56  BILITOT 1.1 1.0   Estimated Creatinine Clearance: 30.4 mL/min (A) (by C-G formula based on SCr of 1.16 mg/dL (H)).   Microbiology: Recent Results (from the past 720 hour(s))  Aerobic Culture (superficial specimen)     Status: None   Collection Time: 03/04/17 12:12 PM  Result Value Ref Range Status   Specimen Description HEEL LEFT HEEL  Final   Special Requests NONE  Final   Gram Stain   Final    NO WBC SEEN MODERATE GRAM POSITIVE COCCI IN PAIRS FEW GRAM NEGATIVE RODS RARE GRAM POSITIVE RODS Performed at [facility], [place] [address] [place], [place] [address]    Culture   Final    MODERATE ESCHERICHIA COLI MODERATE PROTEUS MIRABILIS    Report Status 03/07/2017 FINAL  Final   Organism ID, Bacteria ESCHERICHIA COLI  Final   Organism ID, Bacteria PROTEUS MIRABILIS  Final      Susceptibility   Escherichia coli - MIC*    AMPICILLIN 4 SENSITIVE Sensitive     CEFAZOLIN <=4 SENSITIVE Sensitive     CEFEPIME <=1 SENSITIVE Sensitive     CEFTAZIDIME <=1 SENSITIVE Sensitive     CEFTRIAXONE <=1 SENSITIVE Sensitive     CIPROFLOXACIN <=0.25 SENSITIVE Sensitive     GENTAMICIN <=1  SENSITIVE Sensitive     IMIPENEM <=0.25 SENSITIVE Sensitive     TRIMETH/SULFA <=20 SENSITIVE Sensitive     AMPICILLIN/SULBACTAM <=2 SENSITIVE Sensitive     PIP/TAZO <=4 SENSITIVE Sensitive     Extended ESBL NEGATIVE Sensitive     * MODERATE ESCHERICHIA COLI   Proteus mirabilis - MIC*    AMPICILLIN <=2 SENSITIVE Sensitive     CEFAZOLIN <=4 SENSITIVE Sensitive     CEFEPIME <=1 SENSITIVE Sensitive     CEFTAZIDIME <=1 SENSITIVE Sensitive     CEFTRIAXONE <=1 SENSITIVE Sensitive     CIPROFLOXACIN <=0.25 SENSITIVE Sensitive     GENTAMICIN <=1 SENSITIVE Sensitive     IMIPENEM 2 SENSITIVE Sensitive     TRIMETH/SULFA <=20 SENSITIVE Sensitive     AMPICILLIN/SULBACTAM <=2 SENSITIVE Sensitive     PIP/TAZO <=4 SENSITIVE Sensitive     * MODERATE PROTEUS MIRABILIS  Blood Culture (routine x 2)     Status: None (Preliminary result)   Collection Time: 03/28/17  1:43 PM  Result Value Ref Range Status   Specimen Description BLOOD LEFT ANTECUBITAL  Final   Special Requests   Final    BOTTLES DRAWN AEROBIC AND ANAEROBIC Blood Culture adequate volume   Culture NO GROWTH < 24 HOURS  Final   Report Status PENDING  Incomplete  Blood Culture (routine x 2)     Status:  None (Preliminary result)   Collection Time: 03/28/17  2:17 PM  Result Value Ref Range Status   Specimen Description BLOOD RESISTANT ARM  Final   Special Requests   Final    BOTTLES DRAWN AEROBIC AND ANAEROBIC Blood Culture adequate volume   Culture NO GROWTH < 24 HOURS  Final   Report Status PENDING  Incomplete    Medical History: Past Medical History:  Diagnosis Date  . Anemia   . Chronic kidney disease, stage 3   . Degenerative joint disease (DJD) of lumbar spine   . Diastolic dysfunction   . Dizziness   . Edema   . ETD (eustachian tube dysfunction)   . Heartburn   . History of rectal bleeding   . HTN (hypertension)   . Hypercalcemia   . Intracranial aneurysm   . Low vision, one eye   . Nephritis and nephropathy   .  Osteoporosis   . Pyuria   . Urge incontinence   . Urinary frequency   . Urinary retention     Medications:  Scheduled:  . acetaminophen (TYLENOL) oral liquid 160 mg/5 mL  1,000 mg Oral Once  . enoxaparin (LOVENOX) injection  30 mg Subcutaneous Q24H    Assessment: Patient is a 83 year old female who presents with sepsis of unknown origin. Pt with a K of 3.0 this AM dispite NaCl w/ 20 MEQ KCL running at 1ml/hr  (1.5 MEQ/hr) since 1900 yesterday. Mg is WNL @ 1.7  Goal of Therapy:  normalization of electrolyes  Plan:  Patient cannot take anything by mouth per RN. Will order KCL 10 MEQ IV x 4. Will also give 1g of Mg since Mg is on the lower end of normal. Recheck KCL at 1800. Continue maintenance fluids with K.   Ramond Dial, Pharm.D, BCPS Clinical Pharmacist  03/29/2017,8:50 AM

## 2017-03-30 DIAGNOSIS — L89620 Pressure ulcer of left heel, unstageable: Secondary | ICD-10-CM | POA: Diagnosis not present

## 2017-03-30 DIAGNOSIS — R410 Disorientation, unspecified: Secondary | ICD-10-CM | POA: Diagnosis not present

## 2017-03-30 DIAGNOSIS — L8990 Pressure ulcer of unspecified site, unspecified stage: Secondary | ICD-10-CM | POA: Insufficient documentation

## 2017-03-30 LAB — CBC
HCT: 29.2 % — ABNORMAL LOW (ref 35.0–47.0)
Hemoglobin: 9.6 g/dL — ABNORMAL LOW (ref 12.0–16.0)
MCH: 29.1 pg (ref 26.0–34.0)
MCHC: 33 g/dL (ref 32.0–36.0)
MCV: 88.1 fL (ref 80.0–100.0)
PLATELETS: 181 10*3/uL (ref 150–440)
RBC: 3.31 MIL/uL — ABNORMAL LOW (ref 3.80–5.20)
RDW: 14.7 % — ABNORMAL HIGH (ref 11.5–14.5)
WBC: 5.9 10*3/uL (ref 3.6–11.0)

## 2017-03-30 LAB — BASIC METABOLIC PANEL
Anion gap: 10 (ref 5–15)
BUN: 23 mg/dL — AB (ref 6–20)
CHLORIDE: 108 mmol/L (ref 101–111)
CO2: 26 mmol/L (ref 22–32)
CREATININE: 0.93 mg/dL (ref 0.44–1.00)
Calcium: 9.8 mg/dL (ref 8.9–10.3)
GFR calc Af Amer: >60 mL/min (ref >60)
GFR calc non Af Amer: 55 mL/min — ABNORMAL LOW (ref >60)
GLUCOSE: 94 mg/dL (ref 65–99)
POTASSIUM: 3.5 mmol/L (ref 3.5–5.1)
Sodium: 144 mmol/L (ref 135–145)

## 2017-03-30 LAB — MAGNESIUM: Magnesium: 1.7 mg/dL (ref 1.7–2.4)

## 2017-03-30 MED ORDER — ENOXAPARIN SODIUM 40 MG/0.4ML ~~LOC~~ SOLN: 40.0000 mg | SUBCUTANEOUS | Status: DC

## 2017-03-30 MED ORDER — ADULT MULTIVITAMIN W/MINERALS CH: 1.0000 | ORAL_TABLET | Freq: Every day | ORAL | Status: DC

## 2017-03-30 MED ORDER — NYSTATIN 100000 UNIT/ML MT SUSP: 5.0000 mL | Freq: Four times a day (QID) | OROMUCOSAL | 0 refills | Status: DC

## 2017-03-30 MED ORDER — PANTOPRAZOLE SODIUM 40 MG PO TBEC: 40.0000 mg | DELAYED_RELEASE_TABLET | Freq: Every day | ORAL | 0 refills | Status: DC

## 2017-03-30 MED ORDER — ENSURE ENLIVE PO LIQD: 237.0000 mL | Freq: Three times a day (TID) | ORAL | 12 refills | Status: DC

## 2017-03-30 MED ORDER — CEPHALEXIN 500 MG PO CAPS: 500.0000 mg | ORAL_CAPSULE | Freq: Four times a day (QID) | ORAL | 0 refills | Status: AC

## 2017-03-30 MED ORDER — ENSURE ENLIVE PO LIQD: 237.0000 mL | Freq: Three times a day (TID) | ORAL | Status: DC

## 2017-03-30 NOTE — Consult Note (Signed)
[place] Nurse wound consult note Reason for Consult:Ongoing wounds to bilateral heels.  Seen by podiatry and new orders given.   Wound type:NOnhealing pressure injury.  Seen at [facility] Left heel:  4 cm x 5 cm per podiatry notes.  Right heel:  5 cm x 5 cm x 0.2 cm  Pressure Injury POA: Yes  Wound UUE:[id number] noted to left heel. Right heel clean  Drainage (amount, consistency, odor) Minimal purulence to right heel Left heel serosanguinous Periwound:intact Dressing procedure/placement/frequency:Per podiatry orders.  NS moist gauze dressings.  Pad and protect with 4x4 gauze and kerlix/tape  Change daily. Will not follow at this time.  Please re-consult if needed.  Domenic Moras RN BSN [place] Pager [phone]

## 2017-03-30 NOTE — Progress Notes (Signed)
RN spoke with patient's sister Mariane Duval about patient discharging back home today. Sister agreed to meet EMS at patient's home to help patient inside.   Deri Fuelling, RN

## 2017-03-30 NOTE — Progress Notes (Signed)
Patient renal function has improved. CrCl now greater than 41ml/min. Therefore, per protocol, will increase lovenox dose to 40mg  daily.  Ramond Dial, Pharm.D, BCPS Clinical Pharmacist
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
## 2017-03-30 NOTE — Care Management Note (Signed)
Case Management Note  Patient Details  Name: [name] MRN: [id number] Date of Birth: 03-24-1931  Subjective/Objective:  Discharging today. TC to sister, Ms. Vanhook. Patient has private pay PCS services from 9-12 each day 7 days per week. The sister is in and out each day frequently to assist in her are. She is limited though since she uses a walker herself. Patient is bed bound and must be lifted with a lift. She has a suprapubic catheter. Nephew then comes in at bedtime and gets her ready for bed. Patient will have resumption of care with [facility] for RN, PT, OT and SLP added due to dysphagia.                    Action/Plan: Resumption of care with [facility].   Expected Discharge Date:  03/30/17               Expected Discharge Plan:  [place]  In-House Referral:     Discharge planning Services  CM Consult  Post Acute Care Choice:  Resumption of Svcs/PTA Provider, Home Health Choice offered to:     DME Arranged:    DME Agency:     HH Arranged:  RN, PT, OT, Speech Therapy HH Agency:  [facility]  Status of Service:  Completed, signed off  If discussed at [place] of Stay Meetings, dates discussed:    Additional Comments:  Jolly Mango, RN 03/30/2017, 11:09 AM
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
## 2017-03-30 NOTE — Care Management Important Message (Signed)
Important Message  Patient Details  Name: [name] MRN: [id number] Date of Birth: September 08, 1931   Medicare Important Message Given:  N/A - LOS <3 / Initial given by admissions    Jolly Mango, RN 03/30/2017, 11:19 AM

## 2017-03-30 NOTE — Consult Note (Signed)
MEDICATION RELATED CONSULT NOTE - INITIAL   Pharmacy Consult for electrolytes Indication: hypokalemia  No Known Allergies  Patient Measurements: Weight: 123 lb 12.8 oz (56.2 kg) (RN notified about the difference in weight.) Adjusted Body Weight:   Vital Signs: Temp: 98.6 F (37 C) (06/13 0010) Temp Source: Oral (06/13 0010) BP: 135/72 (06/13 0010) Pulse Rate: 89 (06/13 0010) Intake/Output from previous day: 06/12 0701 - 06/13 0700 In: 1961.3 [I.V.:1611.3; IV Piggyback:350] Out: 1450 [Urine:1450] Intake/Output from this shift: No intake/output data recorded.  Labs:  Recent Labs  03/28/17 1343 03/29/17 0500 03/30/17 0405  WBC 8.5 7.1 5.9  HGB 9.8* 9.0* 9.6*  HCT 29.4* 26.7* 29.2*  PLT 215 195 181  CREATININE 1.41* 1.16* 0.93  MG  --  1.7 1.7  ALBUMIN 3.4* 3.0*  --   PROT 8.3* 7.4  --   AST 29 25  --   ALT 10* 8*  --   ALKPHOS 63 56  --   BILITOT 1.1 1.0  --    Estimated Creatinine Clearance: 37.9 mL/min (by C-G formula based on SCr of 0.93 mg/dL).   Microbiology: Recent Results (from the past 720 hour(s))  Aerobic Culture (superficial specimen)     Status: None   Collection Time: 03/04/17 12:12 PM  Result Value Ref Range Status   Specimen Description HEEL LEFT HEEL  Final   Special Requests NONE  Final   Gram Stain   Final    NO WBC SEEN MODERATE GRAM POSITIVE COCCI IN PAIRS FEW GRAM NEGATIVE RODS RARE GRAM POSITIVE RODS Performed at [facility], [place] [address] [place], [place] [address]    Culture   Final    MODERATE ESCHERICHIA COLI MODERATE PROTEUS MIRABILIS    Report Status 03/07/2017 FINAL  Final   Organism ID, Bacteria ESCHERICHIA COLI  Final   Organism ID, Bacteria PROTEUS MIRABILIS  Final      Susceptibility   Escherichia coli - MIC*    AMPICILLIN 4 SENSITIVE Sensitive     CEFAZOLIN <=4 SENSITIVE Sensitive     CEFEPIME <=1 SENSITIVE Sensitive     CEFTAZIDIME <=1 SENSITIVE Sensitive     CEFTRIAXONE <=1 SENSITIVE Sensitive    CIPROFLOXACIN <=0.25 SENSITIVE Sensitive     GENTAMICIN <=1 SENSITIVE Sensitive     IMIPENEM <=0.25 SENSITIVE Sensitive     TRIMETH/SULFA <=20 SENSITIVE Sensitive     AMPICILLIN/SULBACTAM <=2 SENSITIVE Sensitive     PIP/TAZO <=4 SENSITIVE Sensitive     Extended ESBL NEGATIVE Sensitive     * MODERATE ESCHERICHIA COLI   Proteus mirabilis - MIC*    AMPICILLIN <=2 SENSITIVE Sensitive     CEFAZOLIN <=4 SENSITIVE Sensitive     CEFEPIME <=1 SENSITIVE Sensitive     CEFTAZIDIME <=1 SENSITIVE Sensitive     CEFTRIAXONE <=1 SENSITIVE Sensitive     CIPROFLOXACIN <=0.25 SENSITIVE Sensitive     GENTAMICIN <=1 SENSITIVE Sensitive     IMIPENEM 2 SENSITIVE Sensitive     TRIMETH/SULFA <=20 SENSITIVE Sensitive     AMPICILLIN/SULBACTAM <=2 SENSITIVE Sensitive     PIP/TAZO <=4 SENSITIVE Sensitive     * MODERATE PROTEUS MIRABILIS  Blood Culture (routine x 2)     Status: None (Preliminary result)   Collection Time: 03/28/17  1:43 PM  Result Value Ref Range Status   Specimen Description BLOOD LEFT ANTECUBITAL  Final   Special Requests   Final    BOTTLES DRAWN AEROBIC AND ANAEROBIC Blood Culture adequate volume   Culture NO GROWTH 2  DAYS  Final   Report Status PENDING  Incomplete  Urine culture     Status: Abnormal (Preliminary result)   Collection Time: 03/28/17  1:43 PM  Result Value Ref Range Status   Specimen Description URINE, RANDOM  Final   Special Requests NONE  Final   Culture >=100,000 COLONIES/mL PROTEUS MIRABILIS (A)  Final   Report Status PENDING  Incomplete  Blood Culture (routine x 2)     Status: None (Preliminary result)   Collection Time: 03/28/17  2:17 PM  Result Value Ref Range Status   Specimen Description BLOOD RESISTANT ARM  Final   Special Requests   Final    BOTTLES DRAWN AEROBIC AND ANAEROBIC Blood Culture adequate volume   Culture NO GROWTH 2 DAYS  Final   Report Status PENDING  Incomplete    Medical History: Past Medical History:  Diagnosis Date  . Anemia   .  Chronic kidney disease, stage 3   . Degenerative joint disease (DJD) of lumbar spine   . Diastolic dysfunction   . Dizziness   . Edema   . ETD (eustachian tube dysfunction)   . Heartburn   . History of rectal bleeding   . HTN (hypertension)   . Hypercalcemia   . Intracranial aneurysm   . Low vision, one eye   . Nephritis and nephropathy   . Osteoporosis   . Pyuria   . Urge incontinence   . Urinary frequency   . Urinary retention     Medications:  Scheduled:  . acetaminophen (TYLENOL) oral liquid 160 mg/5 mL  1,000 mg Oral Once  . amLODipine  10 mg Oral Daily  . atenolol  25 mg Oral Daily  . enoxaparin (LOVENOX) injection  40 mg Subcutaneous Q24H  . losartan  100 mg Oral Daily  . nystatin  5 mL Oral QID  . pantoprazole  40 mg Oral Daily    Assessment: Patient is a 83 year old female who presents with sepsis of unknown origin. Pt with a K of 3.0 this AM dispite NaCl w/ 20 MEQ KCL running at 56ml/hr  (1.5 MEQ/hr) since 1900 yesterday. Mg is WNL @ 1.7  Goal of Therapy:  normalization of electrolyes  Plan:  K=3.5 Mg=1.7 No supplementation needed. Continue fluids w/ K and follow up labs in the AM.  Ramond Dial, PharmD, BCPS Clinical Pharmacist 03/30/2017 7:47 AM
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
## 2017-03-30 NOTE — Discharge Summary (Addendum)
[facility] NAME: [name]    MR#:  [id number]  DATE OF BIRTH:  03/24/1931  DATE OF ADMISSION:  03/28/2017 ADMITTING PHYSICIAN: Hillary Bow, MD  DATE OF DISCHARGE: 03/30/2017  PRIMARY CARE PHYSICIAN: [name], MD    ADMISSION DIAGNOSIS:  Delirium [R41.0] Foot ulcer ([place]) [L97.509] Elevated serum creatinine [R79.89] Demand ischemia ([place]) [I24.8] SIRS (systemic inflammatory response syndrome) ([facility]) [R65.10] Acute kidney injury ([place]) [N17.9] Dysphagia [R13.10] Ulcer of foot, chronic, left, with unspecified severity ([place]) [L97.529] Ulcer of foot, chronic, right, with unspecified severity ([place]) [L97.519]  DISCHARGE DIAGNOSIS:  Active Problems:      Pressure injury of skin  Klebsiella UIT SECONDARY DIAGNOSIS:   Past Medical History:  Diagnosis Date  . Anemia   . Chronic kidney disease, stage 3   . Degenerative joint disease (DJD) of lumbar spine   . Diastolic dysfunction   . Dizziness   . Edema   . ETD (eustachian tube dysfunction)   . Heartburn   . History of rectal bleeding   . HTN (hypertension)   . Hypercalcemia   . Intracranial aneurysm   . Low vision, one eye   . Nephritis and nephropathy   . Osteoporosis   . Pyuria   . Urge incontinence   . Urinary frequency   . Urinary retention     HOSPITAL COURSE:  83 year old female with chronic diastolic dysfunction, chronic kidney disease stage III and bedbound status since femur fracture who presented initially for dysphagia to the emergency room.  1. Bilateral foot ulcers with purulent malodorous drainage left foot: Patient was evaluated by podiatry. She is allergic to this in the posterior medial left heel due to bedbound/pressure on that portion of the heel. Does not look overtly infected. There is no tracking. Podiatry has recommended antibiotics. Culture was taken. Patient will follow-up with podiatry in 1 week for results. For now patient will be  discharged on oral antibiotics with Keflex.  2. Proteus UTI: Patient will be discharged on Keflex as per sensitivities from May.   2. Dysphagia: Patient was evaluated by Vania Rea from speech. Pt appears to present w/ min Oral phase dyspahgia possibly impacted by declined Cognitive status/Dementia. Recommendations are for dysphagia level I pured with thin liquids with general aspiration precautions and reflex precautions. She is discharged with nystatin for thrush on her tongue as well as PPI for reflux.   MRI shows no evidence of CVA   3. Acute kidney injury due to decreased oral intake: Creatinine has improved with IV fluids  4. Hypokalemia: This is been repleted    5. Chronic diastolic heart failure without exacerbation   6. Essential hypertension: Continue Norvasc, atenolol and losartan    DISCHARGE CONDITIONS AND DIET:  Stable  Dysphagia level I pured diet with thin liquids and general aspiration precautions  CONSULTS OBTAINED:  Treatment Team:  Albertine Patricia, DPM  DRUG ALLERGIES:  No Known Allergies  DISCHARGE MEDICATIONS:   Current Discharge Medication List    START taking these medications   Details  cephALEXin (KEFLEX) 500 MG capsule Take 1 capsule (500 mg total) by mouth 4 (four) times daily. Qty: 40 capsule, Refills: 0    feeding supplement, ENSURE ENLIVE, (ENSURE ENLIVE) LIQD Take 237 mLs by mouth 3 (three) times daily between meals. Qty: 237 mL, Refills: 12    nystatin (MYCOSTATIN) 100000 UNIT/ML suspension Take 5 mLs (500,000 Units total) by mouth 4 (four) times daily. Qty: 60 mL, Refills: 0  pantoprazole (PROTONIX) 40 MG tablet Take 1 tablet (40 mg total) by mouth daily. Qty: 29 tablet, Refills: 0      CONTINUE these medications which have NOT CHANGED   Details  acetaminophen (TYLENOL) 325 MG tablet Take 650 mg by mouth 2 (two) times daily as needed.    amLODipine (NORVASC) 10 MG tablet TAKE ONE TABLET BY MOUTH EVERY DAY HIGH  BLOOD PRESSURE Refills: 0    atenolol (TENORMIN) 25 MG tablet Take 1 tablet (25 mg total) by mouth daily. Qty: 30 tablet, Refills: 0    losartan (COZAAR) 100 MG tablet Take 1 tablet (100 mg total) by mouth daily. Qty: 30 tablet, Refills: 0    mirabegron ER (MYRBETRIQ) 25 MG TB24 tablet Take 1 tablet (25 mg total) by mouth daily. Qty: 30 tablet, Refills: 3   Associated Diagnoses: OAB (overactive bladder)    Multiple Vitamin (MULTIVITAMIN) tablet Take 1 tablet by mouth daily. Qty: 30 tablet, Refills: 0    oxybutynin (DITROPAN) 5 MG tablet Take 5 mg by mouth 2 (two) times daily. Refills: 2      STOP taking these medications     amoxicillin-clavulanate (AUGMENTIN) 875-125 MG tablet      gabapentin (NEURONTIN) 300 MG capsule           Today   CHIEF COMPLAINT:  Patient doing well this morning. Would like to go home.   VITAL SIGNS:  Blood pressure (!) 143/66, pulse 61, temperature 97.9 F (36.6 C), temperature source Oral, resp. rate 16, weight 56.2 kg (123 lb 12.8 oz), SpO2 100 %.   REVIEW OF SYSTEMS:  Review of Systems  Constitutional: Negative.  Negative for chills, fever and malaise/fatigue.  HENT: Negative.  Negative for ear discharge, ear pain, hearing loss, nosebleeds and sore throat.   Eyes: Negative.  Negative for blurred vision and pain.  Respiratory: Negative.  Negative for cough, hemoptysis, shortness of breath and wheezing.   Cardiovascular: Negative.  Negative for chest pain, palpitations and leg swelling.  Gastrointestinal: Negative.  Negative for abdominal pain, blood in stool, diarrhea, nausea and vomiting.  Genitourinary: Negative.  Negative for dysuria.  Musculoskeletal: Negative.  Negative for back pain.  Skin:       Heel ulcer  Neurological: Negative for dizziness, tremors, speech change, focal weakness, seizures and headaches.  Endo/Heme/Allergies: Negative.  Does not bruise/bleed easily.  Psychiatric/Behavioral: Negative.  Negative for  depression, hallucinations and suicidal ideas.     PHYSICAL EXAMINATION:  GENERAL:  83 y.o.-year-old patient lying in the bed with no acute distress.  NECK:  Supple, no jugular venous distention. No thyroid enlargement, no tenderness.  LUNGS: Normal breath sounds bilaterally, no wheezing, rales,rhonchi  No use of accessory muscles of respiration.  CARDIOVASCULAR: S1, S2 normal. No murmurs, rubs, or gallops.  ABDOMEN: Soft, non-tender, non-distended. Bowel sounds present. No organomegaly or mass.  EXTREMITIES: No pedal edema, cyanosis, or clubbing.  PSYCHIATRIC: The patient is alert and oriented x 3.  SKIN large decub  on post left heel no tracking no drainage.   DATA REVIEW:   CBC  Recent Labs Lab 03/30/17 0405  WBC 5.9  HGB 9.6*  HCT 29.2*  PLT 181    Chemistries   Recent Labs Lab 03/29/17 0500  03/30/17 0405  NA 144  --  144  K 3.0*  < > 3.5  CL 105  --  108  CO2 28  --  26  GLUCOSE 81  --  94  BUN 35*  --  23*  CREATININE 1.16*  --  0.93  CALCIUM 9.7  --  9.8  MG 1.7  --  1.7  AST 25  --   --   ALT 8*  --   --   ALKPHOS 56  --   --   BILITOT 1.0  --   --   < > = values in this interval not displayed.  Cardiac Enzymes  Recent Labs Lab 03/28/17 1343  TROPONINI 0.09*    Microbiology Results  @[email or web address] @  RADIOLOGY:  Ct Head Wo Contrast  Result Date: 03/28/2017 CLINICAL DATA:  Code sepsis, altered mental status EXAM: CT HEAD WITHOUT CONTRAST TECHNIQUE: Contiguous axial images were obtained from the base of the skull through the vertex without intravenous contrast. COMPARISON:  MRI brain dated 06/09/2016. FINDINGS: Brain: No evidence of acute infarction, hemorrhage, hydrocephalus, extra-axial collection or mass lesion/mass effect. Subcortical white matter and periventricular small vessel ischemic changes. Vascular: Intracranial atherosclerosis. Skull: Normal. Negative for fracture or focal lesion. Sinuses/Orbits: The visualized paranasal sinuses are  essentially clear. The mastoid air cells are unopacified. Other: None. IMPRESSION: No evidence of acute intracranial abnormality. Small vessel ischemic changes. Electronically Signed   By: Julian Hy M.D.   On: 03/28/2017 14:48   Mr Brain Wo Contrast  Result Date: 03/28/2017 CLINICAL DATA:  Dysphagia EXAM: MRI HEAD WITHOUT CONTRAST TECHNIQUE: Multiplanar, multiecho pulse sequences of the brain and surrounding structures were obtained without intravenous contrast. COMPARISON:  Head CT 03/28/2017 Brain MRI 06/09/2016 FINDINGS: Brain: The midline structures are normal. There is no focal diffusion restriction to indicate acute infarct. There is confluent hyperintense T2-weighted signal within the periventricular white matter, most often seen in the setting of chronic microvascular ischemia. No intraparenchymal hematoma or chronic microhemorrhage. Brain volume is normal for age without age-advanced or lobar predominant atrophy. The dura is normal and there is no extra-axial collection. Vascular: There is an aneurysm of the right MCA bifurcation, measuring approximately 7 mm, unchanged. Skull and upper cervical spine: The visualized skull base, calvarium, upper cervical spine and extracranial soft tissues are normal. Sinuses/Orbits: No fluid levels or advanced mucosal thickening. No mastoid effusion. Normal orbits. IMPRESSION: 1. No acute intracranial abnormality. 2. Confluent bilateral periventricular white matter disease, most commonly indicating chronic microvascular ischemia. 3. 7 mm right MCA bifurcation aneurysm, unchanged. Electronically Signed   By: Ulyses Jarred M.D.   On: 03/28/2017 21:59   Dg Chest Port 1 View  Result Date: 03/28/2017 CLINICAL DATA:  Difficulty swallowing 3 months.  Possible sepsis. EXAM: PORTABLE CHEST 1 VIEW COMPARISON:  06/08/2016 FINDINGS: Lungs are adequately inflated without focal airspace consolidation or effusion. Subtle density of the lateral left lower thorax likely  bony in origin as there are adjacent old rib fractures. Cardiomediastinal silhouette is within normal. There is calcified plaque over the aortic arch. Remaining bones and soft tissues are unchanged. IMPRESSION: No acute cardiopulmonary disease. Focal density over the lateral left thorax likely bony in origin related to adjacent rib fractures. Consider follow-up chest radiograph 3 months. Aortic atherosclerosis. Electronically Signed   By: Marin Olp M.D.   On: 03/28/2017 14:32   Dg Foot Complete Left  Result Date: 03/28/2017 CLINICAL DATA:  83 year old female with a history of foot pain and an open wound EXAM: LEFT FOOT - COMPLETE 3+ VIEW COMPARISON:  09/17/2016 FINDINGS: Advanced osteopenia, similar to prior. No acute displaced fracture, although the sensitivity for the detection of nondisplaced fractures is limited by the osteopenia. No radiopaque foreign body. No focal soft tissue swelling with diffuse  soft tissue swelling of the forefoot and hindfoot. No definite erosive changes. Degenerative changes of the interphalangeal joints. IMPRESSION: No acute bony abnormality or evidence of osteomyelitis, however, osteopenia limits the sensitivity of the exam for both conditions. Degenerative changes of the interphalangeal joints. Electronically Signed   By: [name] D.O.   On: 03/28/2017 17:01      Current Discharge Medication List    START taking these medications   Details  cephALEXin (KEFLEX) 500 MG capsule Take 1 capsule (500 mg total) by mouth 4 (four) times daily. Qty: 40 capsule, Refills: 0    feeding supplement, ENSURE ENLIVE, (ENSURE ENLIVE) LIQD Take 237 mLs by mouth 3 (three) times daily between meals. Qty: 237 mL, Refills: 12    nystatin (MYCOSTATIN) 100000 UNIT/ML suspension Take 5 mLs (500,000 Units total) by mouth 4 (four) times daily. Qty: 60 mL, Refills: 0    pantoprazole (PROTONIX) 40 MG tablet Take 1 tablet (40 mg total) by mouth daily. Qty: 29 tablet, Refills: 0       CONTINUE these medications which have NOT CHANGED   Details  acetaminophen (TYLENOL) 325 MG tablet Take 650 mg by mouth 2 (two) times daily as needed.    amLODipine (NORVASC) 10 MG tablet TAKE ONE TABLET BY MOUTH EVERY DAY HIGH BLOOD PRESSURE Refills: 0    atenolol (TENORMIN) 25 MG tablet Take 1 tablet (25 mg total) by mouth daily. Qty: 30 tablet, Refills: 0    losartan (COZAAR) 100 MG tablet Take 1 tablet (100 mg total) by mouth daily. Qty: 30 tablet, Refills: 0    mirabegron ER (MYRBETRIQ) 25 MG TB24 tablet Take 1 tablet (25 mg total) by mouth daily. Qty: 30 tablet, Refills: 3   Associated Diagnoses: OAB (overactive bladder)    Multiple Vitamin (MULTIVITAMIN) tablet Take 1 tablet by mouth daily. Qty: 30 tablet, Refills: 0    oxybutynin (DITROPAN) 5 MG tablet Take 5 mg by mouth 2 (two) times daily. Refills: 2      STOP taking these medications     amoxicillin-clavulanate (AUGMENTIN) 875-125 MG tablet      gabapentin (NEURONTIN) 300 MG capsule            Management plans discussed with the patient and she is in agreement. Stable for discharge home with [facility]  Patient should follow up with pcp  CODE STATUS:     Code Status Orders        Start     Ordered   03/28/17 1646  Do not attempt resuscitation (DNR)  Continuous    Question Answer Comment  In the event of cardiac or respiratory ARREST Do not call a "code blue"   In the event of cardiac or respiratory ARREST Do not perform Intubation, CPR, defibrillation or ACLS   In the event of cardiac or respiratory ARREST Use medication by any route, position, wound care, and other measures to relive pain and suffering. May use oxygen, suction and manual treatment of airway obstruction as needed for comfort.      03/28/17 1649    Code Status History    Date Active Date Inactive Code Status Order ID Comments User Context   06/08/2016  8:40 PM 06/11/2016  9:41 PM Full Code [id number]  Hower, Aaron Mose, MD ED      TOTAL  TIME TAKING CARE OF THIS PATIENT: 37 minutes.    Note: This dictation was prepared with Dragon dictation along with smaller phrase technology. Any transcriptional errors that result from this process  are unintentional.  Olivene Cookston M.D on 03/30/2017 at 11:56 AM  Between 7am to 6pm - Pager - [phone] After 6pm go to [email or web address] - password [email or web address] [facility]  Office  [phone]  CC: Primary care physician; [name], MD

## 2017-03-31 ENCOUNTER — Ambulatory Visit: Payer: Medicare HMO | Admitting: Surgery

## 2017-03-31 LAB — URINE CULTURE

## 2017-04-06 LAB — CULTURE, BLOOD (ROUTINE X 2)
CULTURE: NO GROWTH
Culture: NO GROWTH
SPECIAL REQUESTS: ADEQUATE
SPECIAL REQUESTS: ADEQUATE

## 2017-04-07 ENCOUNTER — Ambulatory Visit (INDEPENDENT_AMBULATORY_CARE_PROVIDER_SITE_OTHER): Payer: Medicare HMO

## 2017-04-07 VITALS — BP 158/77 | HR 71

## 2017-04-07 DIAGNOSIS — R339 Retention of urine, unspecified: Secondary | ICD-10-CM | POA: Diagnosis not present

## 2017-04-07 NOTE — Progress Notes (Signed)
Suprapubic Cath Change  Patient is present today for a suprapubic catheter change due to urinary retention.  71ml of water was drained from the balloon, a 16FR foley cath was removed from the tract with out difficulty.  Site was cleaned and prepped in a sterile fashion with betadine.  A 16FR foley cath was replaced into the tract no complications were noted. Urine return was noted, 10 ml of sterile water was inflated into the balloon and a night bag was attached for drainage.  Patient tolerated well.    Performed by: C. Corinna Capra, CMA and S. Watts, CMA   Follow up: 1 month

## 2017-04-08 ENCOUNTER — Encounter: Payer: Medicare HMO | Admitting: Physician Assistant

## 2017-04-08 DIAGNOSIS — L97512 Non-pressure chronic ulcer of other part of right foot with fat layer exposed: Secondary | ICD-10-CM | POA: Diagnosis not present

## 2017-04-08 DIAGNOSIS — D649 Anemia, unspecified: Secondary | ICD-10-CM | POA: Diagnosis not present

## 2017-04-08 DIAGNOSIS — Z993 Dependence on wheelchair: Secondary | ICD-10-CM | POA: Diagnosis not present

## 2017-04-08 DIAGNOSIS — L89620 Pressure ulcer of left heel, unstageable: Secondary | ICD-10-CM | POA: Diagnosis not present

## 2017-04-08 DIAGNOSIS — I129 Hypertensive chronic kidney disease with stage 1 through stage 4 chronic kidney disease, or unspecified chronic kidney disease: Secondary | ICD-10-CM | POA: Diagnosis not present

## 2017-04-08 DIAGNOSIS — L89322 Pressure ulcer of left buttock, stage 2: Secondary | ICD-10-CM | POA: Diagnosis not present

## 2017-04-08 DIAGNOSIS — L89610 Pressure ulcer of right heel, unstageable: Secondary | ICD-10-CM | POA: Diagnosis not present

## 2017-04-08 DIAGNOSIS — N189 Chronic kidney disease, unspecified: Secondary | ICD-10-CM | POA: Diagnosis not present

## 2017-04-10 NOTE — Progress Notes (Addendum)
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 application in clinic; including contact layer, fixation with steri strips, dry gauze and cover dressing. Wound #2 Left,Medial Calcaneus: Aquacel Ag Secondary Dressing: Wound #2 Left,Medial Calcaneus: ABD and Kerlix/Conform Dressing Change Frequency: Wound #1 Right,Medial Calcaneus: Change dressing every week - Leave in place unless there is excess drainage, then may change outer dressing down to the steri-strips. Wound #2 Left,Medial Calcaneus: Change Dressing Monday, Wednesday, Friday Follow-up Appointments: Wound #1 Right,Medial Calcaneus: Return Appointment in 1 week. Wound #2 Left,Medial Calcaneus: Return Appointment in 1 week. Edema Control: Wound #1 Right,Medial Calcaneus: Elevate legs to the level of the heart and pump ankles as often as possible [name], Danielle S. ([id number]) Wound #2 Left,Medial Calcaneus: Elevate legs to the level of the heart and pump ankles as often as possible Off-Loading: Wound #1 Right,Medial Calcaneus: Other: - No pressure on heels!!! Float heel while in chair or bed. Sage boots at night along with floating heels. Wound #2 Left,Medial Calcaneus: Other: - No pressure on heels!!! Float heel while in chair or bed. Sage  boots at night along with floating heels. Additional Orders / Instructions: Wound #1 Right,Medial Calcaneus: Increase protein intake. Other: - Vitamins A, C and Zinc Wound #2 Left,Medial Calcaneus: Increase protein intake. Other: - Vitamins A, C and Zinc Home Health: Wound #1 Right,Medial Calcaneus: Continue Home Health Visits - [place] Nurse may visit PRN to address patient s wound care needs. FACE TO FACE ENCOUNTER: MEDICARE and MEDICAID PATIENTS: I certify that this patient is under my care and that I had a face-to-face encounter that meets the physician face-to-face encounter requirements with this patient on this date. The encounter with the patient was in whole or in part for the following MEDICAL CONDITION: (primary reason for [place]) MEDICAL NECESSITY: I certify, that based on my findings, NURSING services are a medically necessary home health service. HOME BOUND STATUS: I certify that my clinical findings support that this patient is homebound (i.e., Due to illness or injury, pt requires aid of supportive devices such as crutches, cane, wheelchairs, walkers, the use of special transportation or the assistance of another person to leave their place of residence. There is a normal inability to leave the home and doing so requires considerable and taxing effort. Other absences are for medical reasons / religious services and are infrequent or of short duration when for other reasons). If current dressing causes regression in wound condition, may D/C ordered dressing product/s and apply Normal Saline Moist Dressing daily until next [place] / Other MD appointment. [place] of regression in wound condition at [phone]. Please direct any NON-WOUND related issues/requests for orders to patient's Primary Care Physician Wound #2 Left,Medial Calcaneus: [place] Visits - [place] Nurse may visit PRN to address  patient s wound care needs. FACE TO FACE ENCOUNTER: MEDICARE and MEDICAID PATIENTS: I certify that this  patient is under my care and that I had a face-to-face encounter that meets the physician face-to-face encounter requirements with this patient on this date. The encounter with the patient was in whole or in part for the following MEDICAL CONDITION: (primary reason for [place]) MEDICAL NECESSITY: I certify, that based on my findings, NURSING services are a medically necessary home health service. HOME BOUND STATUS: I certify that my clinical findings support that this patient is homebound (i.e., Due to illness or injury, pt requires aid of supportive devices such as crutches, cane, wheelchairs, walkers, the use of special transportation or the assistance of another person to leave their place of residence. There is a normal inability to leave the home and doing so requires considerable and taxing effort. Other absences are for medical reasons / religious services and are infrequent or of short duration when for other reasons). If current dressing causes regression in wound condition, may D/C ordered dressing product/s and apply Normal Saline Moist Dressing daily until next [place] / Other MD appointment. [place] of regression in wound condition at [phone]. Please direct any NON-WOUND related issues/requests for orders to patient's Primary Care Physician [name], [name]. ([id number]) I was successfully able to apply the affinity graft during evaluation today. She tolerated this without complication. We will see her for reevaluation in one week to see were things stand at that point. [place] in the interim she will contact the office for additional recommendations. Electronic Signature(s) Signed: 04/11/2017 5:09:57 PM By: Worthy Keeler PA-C Previous Signature: 04/08/2017 5:31:15 PM Version By: Worthy Keeler PA-C Entered By: Worthy Keeler on  04/11/2017 13:26:35 [name] ([id number]) -------------------------------------------------------------------------------- SuperBill Details Patient Name: [name] Date of Service: 04/08/2017 Medical Record Number: [id number] Patient Account Number: [id number] Date of Birth/Sex: [birth year] (83 y.o. 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 Pressure ulcer of left buttock, stage 2 Facility Procedures CPT4 Code: 03754360 Description: 67703 - WOUND CARE VISIT-LEV 3 EST PT Modifier: Quantity: 1 Physician Procedures CPT4 Code: 4035248 Description: 18590 - WC PHYS SKIN SUB GRAFT FACE/NK/HF/G ICD-10 Description Diagnosis L89.613 Pressure ulcer of right heel, stage 3 Modifier: Quantity: 1 Electronic Signature(s) Signed: 04/11/2017 2:55:44 PM By: Gretta Cool, BSN, RN, CWS, Kim RN, BSN Signed: 04/11/2017 5:09:57 PM By: Worthy Keeler PA-C Previous Signature: 04/08/2017 5:31:15 PM Version By: Worthy Keeler PA-C Previous Signature: 04/08/2017 4:20:50 PM Version By: Gretta Cool, BSN, RN, CWS, Kim RN, BSN Entered By: Gretta Cool, BSN, RN, CWS, Kim on 04/11/2017 14:55:43

## 2017-04-11 NOTE — Progress Notes (Addendum)
Danielle, Zuniga ([id number]) Visit Report for 04/08/2017 Arrival Information Details Patient Name: [name], [name]. Date of Service: 04/08/2017 3:00 PM Medical Record Number: [id number] Patient Account Number: [id number] Date of Birth/Sex: [birth year] (83 y.o. Female) Treating RN: Cornell Barman Primary Care Tadarrius Burch: [name] Other Clinician: Referring Keynan Heffern: [name] Treating Taliah Porche/Extender: Melburn Hake, HOYT Weeks in Treatment: 30 Visit Information History Since Last Visit Added or deleted any medications: No Patient Arrived: Wheel Chair Any new allergies or adverse reactions: No Arrival Time: 15:17 Had a fall or experienced change in No activities of daily living that may affect Accompanied By: self risk of falls: Transfer Assistance: [employer] fast streamer Signs or symptoms of abuse/neglect since last No Patient Identification Verified: Yes visito Secondary Verification Process Yes Hospitalized since last visit: No Completed: Has Dressing in Place as Prescribed: Yes Patient Requires Transmission-Based No Pain Present Now: No Precautions: Patient Has Alerts: No Electronic Signature(s) Signed: 04/08/2017 4:40:54 PM By: Gretta Cool, BSN, RN, CWS, Kim RN, BSN Entered By: Gretta Cool, BSN, RN, CWS, Kim on 04/08/2017 15:20:36 [name] ([id number]) -------------------------------------------------------------------------------- Clinic Level of Care Assessment Details Patient Name: [name]. Date of Service: 04/08/2017 3:00 PM Medical Record Number: [id number] Patient Account Number: [id number] Date of Birth/Sex: Sep 22, 1931 (83 y.o. Female) Treating RN: Cornell Barman Primary Care Krystian Younglove: [name] Other Clinician: Referring Xochitl Egle: [name] Treating Holle Sprick/Extender: Melburn Hake, HOYT Weeks in Treatment: 30 Clinic Level of Care Assessment Items TOOL 4 Quantity Score []  - Use when only an EandM is performed on FOLLOW-UP visit 0 ASSESSMENTS - Nursing  Assessment / Reassessment []  - Reassessment of Co-morbidities (includes updates in patient status) 0 X - Reassessment of Adherence to Treatment Plan 1 5 ASSESSMENTS - Wound and Skin Assessment / Reassessment []  - Simple Wound Assessment / Reassessment - one wound 0 X - Complex Wound Assessment / Reassessment - multiple wounds 2 5 []  - Dermatologic / Skin Assessment (not related to wound area) 0 ASSESSMENTS - Focused Assessment []  - Circumferential Edema Measurements - multi extremities 0 []  - Nutritional Assessment / Counseling / Intervention 0 []  - Lower Extremity Assessment (monofilament, tuning fork, pulses) 0 []  - Peripheral Arterial Disease Assessment (using hand held doppler) 0 ASSESSMENTS - Ostomy and/or Continence Assessment and Care []  - Incontinence Assessment and Management 0 []  - Ostomy Care Assessment and Management (repouching, etc.) 0 PROCESS - Coordination of Care X - Simple Patient / Family Education for ongoing care 1 15 []  - Complex (extensive) Patient / Family Education for ongoing care 0 X - Staff obtains Programmer, systems, Records, Test Results / Process Orders 1 10 []  - Staff telephones HHA, Nursing Homes / Clarify orders / etc 0 []  - Routine Transfer to another Facility (non-emergent condition) 0 MANJOT, BEUMER ([id number]) []  - Routine Hospital Admission (non-emergent condition) 0 []  - New Admissions / Biomedical engineer / Ordering NPWT, Apligraf, etc. 0 []  - Emergency Hospital Admission (emergent condition) 0 X - Simple Discharge Coordination 1 10 []  - Complex (extensive) Discharge Coordination 0 PROCESS - Special Needs []  - Pediatric / Minor Patient Management 0 []  - Isolation Patient Management 0 []  - Hearing / Language / Visual special needs 0 []  - Assessment of Community assistance (transportation, D/C planning, etc.) 0 []  - Additional assistance / Altered mentation 0 []  - Support Surface(s) Assessment (bed, cushion, seat, etc.) 0 INTERVENTIONS - Wound  Cleansing / Measurement []  - Simple Wound Cleansing - one wound 0 X - Complex Wound Cleansing - multiple wounds 2 5 X - Wound  Imaging (photographs - any number of wounds) 1 5 []  - Wound Tracing (instead of photographs) 0 []  - Simple Wound Measurement - one wound 0 X - Complex Wound Measurement - multiple wounds 2 5 INTERVENTIONS - Wound Dressings X - Small Wound Dressing one or multiple wounds 1 10 X - Medium Wound Dressing one or multiple wounds 1 15 []  - Large Wound Dressing one or multiple wounds 0 []  - Application of Medications - topical 0 []  - Application of Medications - injection 0 INTERVENTIONS - Miscellaneous []  - External ear exam 0 Sean, Marliyah S. ([id number]) []  - Specimen Collection (cultures, biopsies, blood, body fluids, etc.) 0 []  - Specimen(s) / Culture(s) sent or taken to Lab for analysis 0 []  - Patient Transfer (multiple staff / Harrel Lemon Lift / Similar devices) 0 []  - Simple Staple / Suture removal (25 or less) 0 []  - Complex Staple / Suture removal (26 or more) 0 []  - Hypo / Hyperglycemic Management (close monitor of Blood Glucose) 0 []  - Ankle / Brachial Index (ABI) - do not check if billed separately 0 X - Vital Signs 1 5 Has the patient been seen at the hospital within the last three years: Yes Total Score: 105 Level Of Care: New/Established - Level 3 Electronic Signature(s) Signed: 04/11/2017 4:17:42 PM By: Gretta Cool, BSN, RN, CWS, Kim RN, BSN Entered By: Gretta Cool, BSN, RN, CWS, Kim on 04/11/2017 14:55:33 [name] ([id number]) -------------------------------------------------------------------------------- Encounter Discharge Information Details Patient Name: [name]. Date of Service: 04/08/2017 3:00 PM Medical Record Number: [id number] Patient Account Number: [id number] Date of Birth/Sex: 06/25/1931 (83 y.o. Female) Treating RN: Cornell Barman Primary Care Kristyanna Barcelo: [name] Other Clinician: Referring Tavian Callander: [name] Treating  Kartik Fernando/Extender: Melburn Hake, HOYT Weeks in Treatment: 30 Encounter Discharge Information Items Discharge Pain Level: 0 Discharge Condition: Stable Ambulatory Status: Wheelchair Emergency Discharge Destination: Room Transportation: Private Auto Accompanied By: self Schedule Follow-up Appointment: No Medication Reconciliation completed and provided to Patient/Care No Hadiya Spoerl: Provided on Clinical Summary of Care: 04/08/2017 Form Type Recipient Paper Patient EB Electronic Signature(s) Signed: 04/22/2017 11:02:01 AM By: Gretta Cool, BSN, RN, CWS, Kim RN, BSN Previous Signature: 04/08/2017 4:07:22 PM Version By: Ruthine Dose Entered By: Gretta Cool BSN, RN, CWS, Kim on 04/22/2017 11:02:00 [name] ([id number]) -------------------------------------------------------------------------------- Lower Extremity Assessment Details Patient Name: [name]. Date of Service: 04/08/2017 3:00 PM Medical Record Number: [id number] Patient Account Number: [id number] Date of Birth/Sex: 27-Mar-1931 (83 y.o. Female) Treating RN: Cornell Barman Primary Care Kamyah Wilhelmsen: [name] Other Clinician: Referring Earle Burson: [name] Treating Mats Jeanlouis/Extender: Melburn Hake, HOYT Weeks in Treatment: 30 Vascular Assessment Pulses: Dorsalis Pedis Palpable: [Left:Yes] [Right:Yes] Posterior Tibial Extremity colors, hair growth, and conditions: Extremity Color: [Left:Hyperpigmented] [Right:Hyperpigmented] Hair Growth on Extremity: [Left:No] [Right:No] Temperature of Extremity: [Left:Warm] [Right:Warm] Capillary Refill: [Left:< 3 seconds] [Right:< 3 seconds] Dependent Rubor: [Right:No] Blanched when Elevated: [Left:No] [Right:No] Lipodermatosclerosis: [Left:No] [Right:No] Toe Nail Assessment Left: Right: Thick: Yes Yes Discolored: Yes Yes Deformed: Yes Yes Improper Length and Hygiene: Yes Yes Electronic Signature(s) Signed: 04/08/2017 4:40:54 PM By: Gretta Cool, BSN, RN, CWS, Kim RN, BSN Entered By:  Gretta Cool, BSN, RN, CWS, Kim on 04/08/2017 15:32:18 [name] ([id number]) -------------------------------------------------------------------------------- Multi Wound Chart Details Patient Name: [name]. Date of Service: 04/08/2017 3:00 PM Medical Record Number: [id number] Patient Account Number: [id number] Date of Birth/Sex: 06/20/1931 (83 y.o. Female) Treating RN: Cornell Barman Primary Care Tyreek Clabo: [name] Other Clinician: Referring Odeth Bry: [name] Treating Ilisha Blust/Extender: Melburn Hake, HOYT Weeks in Treatment: 30 Vital Signs Height(in):  63 Pulse(bpm): 79 Weight(lbs): 160 Blood Pressure 129/75 (mmHg): Body Mass Index(BMI): 28 Temperature(F): 98.1 Respiratory Rate 16 (breaths/min): Photos: [N/A:N/A] Wound Location: Right, Medial Calcaneus Left, Medial Calcaneus N/A Wounding Event: Pressure Injury Pressure Injury N/A Primary Etiology: Pressure Ulcer Pressure Ulcer N/A Date Acquired: 07/02/2016 07/02/2016 N/A Weeks of Treatment: 30 30 N/A Wound Status: Open Open N/A Measurements L x W x D 0.5x0.5x0.1 3.2x4x0.4 N/A (cm) Area (cm) : 0.196 10.053 N/A Volume (cm) : 0.02 4.021 N/A % Reduction in Area: 98.40% 49.20% N/A % Reduction in Volume: 98.40% -103.20% N/A Classification: Category/Stage III Category/Stage III N/A Periwound Skin Texture: No Abnormalities Noted No Abnormalities Noted N/A Periwound Skin No Abnormalities Noted No Abnormalities Noted N/A Moisture: Periwound Skin Color: No Abnormalities Noted No Abnormalities Noted N/A Tenderness on No No N/A Palpation: Procedures Performed: Cellular or Tissue Based N/A N/A Product Treatment Notes YAZMYNE, SARA ([id number]) Electronic Signature(s) Signed: 04/11/2017 2:54:35 PM By: Gretta Cool, BSN, RN, CWS, Kim RN, BSN Entered By: Gretta Cool, BSN, RN, CWS, Kim on 04/11/2017 14:54:34 [name]  ([id number]) -------------------------------------------------------------------------------- [place] Plan Details Patient Name: [name]. Date of Service: 04/08/2017 3:00 PM Medical Record Number: [id number] Patient Account Number: [id number] Date of Birth/Sex: 02/24/1931 (83 y.o. Female) Treating RN: Cornell Barman Primary Care Arena Lindahl: [name] Other Clinician: Referring Sherese Heyward: [name] Treating Lillianne Eick/Extender: Melburn Hake, HOYT Weeks in Treatment: 30 Active Inactive ` Abuse / Safety / Falls / Self Care Management Nursing Diagnoses: Impaired physical mobility Potential for falls Goals: Patient will remain injury free Date Initiated: 09/06/2016 Target Resolution Date: 12/02/2016 Goal Status: Active Interventions: Assess fall risk on admission and as needed Assess self care needs on admission and as needed Notes: ` Necrotic Tissue Nursing Diagnoses: Impaired tissue integrity related to necrotic/devitalized tissue Goals: Necrotic/devitalized tissue will be minimized in the wound bed Date Initiated: 09/06/2016 Target Resolution Date: 12/02/2016 Goal Status: Active Interventions: Assess patient pain level pre-, during and post procedure and prior to discharge Notes: ` Orientation to the Wound Care Program Nursing Diagnoses: BRANNON, DECAIRE ([id number]) Knowledge deficit related to the wound healing center program Goals: Patient/caregiver will verbalize understanding of the [facility] Program Date Initiated: 09/06/2016 Target Resolution Date: 12/02/2016 Goal Status: Active Interventions: Provide education on orientation to the wound center Notes: ` Pressure Nursing Diagnoses: Knowledge deficit related to management of pressures ulcers Potential for impaired tissue integrity related to pressure, friction, moisture, and shear Goals: Patient will remain free of pressure ulcers Date Initiated: 09/06/2016 Target Resolution  Date: 12/02/2016 Goal Status: Active Interventions: Assess: immobility, friction, shearing, incontinence upon admission and as needed Notes: ` Soft Tissue Infection Nursing Diagnoses: Impaired tissue integrity Potential for infection: soft tissue Goals: Patient will remain free of wound infection Date Initiated: 09/06/2016 Target Resolution Date: 12/02/2016 Goal Status: Active Interventions: Assess signs and symptoms of infection every visit Notes: ` Wound/Skin Impairment ADRIYANNA, CHRISTIANS ([id number]) Nursing Diagnoses: Impaired tissue integrity Goals: Ulcer/skin breakdown will heal within 14 weeks Date Initiated: 09/06/2016 Target Resolution Date: 12/16/2016 Goal Status: Active Interventions: Assess ulceration(s) every visit Notes: Electronic Signature(s) Signed: 04/11/2017 2:54:24 PM By: Gretta Cool, BSN, RN, CWS, Kim RN, BSN Previous Signature: 04/08/2017 4:40:54 PM Version By: Gretta Cool, BSN, RN, CWS, Kim RN, BSN Entered By: Gretta Cool, BSN, RN, CWS, Kim on 04/11/2017 14:54:23 [name] ([id number]) -------------------------------------------------------------------------------- Pain Assessment Details Patient Name: [name]. Date of Service: 04/08/2017 3:00 PM Medical Record Number: [id number] Patient Account Number: [id number] Date of Birth/Sex: [birth year] (83 y.o. Female) Treating RN: Gretta Cool,  [place] Primary Care Monay Houlton: [name] Other Clinician: Referring Luke Falero: [name] Treating Jr Milliron/Extender: Melburn Hake, HOYT Weeks in Treatment: 30 Active Problems Location of Pain Severity and Description of Pain Patient Has Paino No Site Locations With Dressing Change: No Pain Management and Medication Current Pain Management: Electronic Signature(s) Signed: 04/08/2017 4:40:54 PM By: Gretta Cool, BSN, RN, CWS, Kim RN, BSN Entered By: Gretta Cool, BSN, RN, CWS, Kim on 04/08/2017 15:20:46 [name]  ([id number]) -------------------------------------------------------------------------------- Patient/Caregiver Education Details Patient Name: [name] Date of Service: 04/08/2017 3:00 PM Medical Record Number: [id number] Patient Account Number: [id number] Date of Birth/Gender: [birth year] (83 y.o. Female) Treating RN: Cornell Barman Primary Care Physician: [name] Other Clinician: Referring Physician: [name] Treating Physician/Extender: Sharalyn Ink in Treatment: 30 Education Assessment Education Provided To: Caregiver Education Topics Provided Pressure: Handouts: Pressure Ulcers: Care and Offloading, Preventing Pressure Ulcers Methods: Demonstration Responses: State content correctly Electronic Signature(s) Signed: 04/27/2017 10:25:24 AM By: Gretta Cool, BSN, RN, CWS, Kim RN, BSN Entered By: Gretta Cool, BSN, RN, CWS, Kim on 04/22/2017 11:02:21 [name] ([id number]) -------------------------------------------------------------------------------- Wound Assessment Details Patient Name: [name]. Date of Service: 04/08/2017 3:00 PM Medical Record Number: [id number] Patient Account Number: [id number] Date of Birth/Sex: Apr 12, 1931 (83 y.o. Female) Treating RN: Cornell Barman Primary Care Michaiah Holsopple: [name] Other Clinician: Referring Francella Barnett: [name] Treating Genavive Kubicki/Extender: Melburn Hake, HOYT Weeks in Treatment: 30 Wound Status Wound Number: 1 Primary Etiology: Pressure Ulcer Wound Location: Right, Medial Calcaneus Wound Status: Open Wounding Event: Pressure Injury Date Acquired: 07/02/2016 Weeks Of Treatment: 30 Clustered Wound: No Photos Photo Uploaded By: Gretta Cool, BSN, RN, CWS, Kim on 04/08/2017 16:47:05 Wound Measurements Length: (cm) 0.5 Width: (cm) 0.5 Depth: (cm) 0.1 Area: (cm) 0.196 Volume: (cm) 0.02 % Reduction in Area: 98.4% % Reduction in Volume: 98.4% Wound Description Classification: Category/Stage  III Periwound Skin Texture Texture Color No Abnormalities Noted: No No Abnormalities Noted: No Moisture No Abnormalities Noted: No Electronic Signature(s) Signed: 04/08/2017 4:40:54 PM By: Gretta Cool, BSN, RN, CWS, Kim RN, BSN Entered By: Gretta Cool, BSN, RN, CWS, Kim on 04/08/2017 15:30:41 [name] ([id number]) -------------------------------------------------------------------------------- Wound Assessment Details Patient Name: [name]. Date of Service: 04/08/2017 3:00 PM Medical Record Number: [id number] Patient Account Number: [id number] Date of Birth/Sex: 10-18-1931 (83 y.o. Female) Treating RN: Cornell Barman Primary Care Almarie Kurdziel: [name] Other Clinician: Referring Shivan Hodes: [name] Treating Tijah Hane/Extender: Melburn Hake, HOYT Weeks in Treatment: 30 Wound Status Wound Number: 2 Primary Etiology: Pressure Ulcer Wound Location: Left, Medial Calcaneus Wound Status: Open Wounding Event: Pressure Injury Date Acquired: 07/02/2016 Weeks Of Treatment: 30 Clustered Wound: No Photos Photo Uploaded By: Gretta Cool, BSN, RN, CWS, Kim on 04/08/2017 16:47:05 Wound Measurements Length: (cm) 3.2 Width: (cm) 4 Depth: (cm) 0.4 Area: (cm) 10.053 Volume: (cm) 4.021 % Reduction in Area: 49.2% % Reduction in Volume: -103.2% Wound Description Classification: Category/Stage III Periwound Skin Texture Texture Color No Abnormalities Noted: No No Abnormalities Noted: No Moisture No Abnormalities Noted: No Electronic Signature(s) Signed: 04/08/2017 4:40:54 PM By: Gretta Cool, BSN, RN, CWS, Kim RN, BSN Entered By: Gretta Cool, BSN, RN, CWS, Kim on 04/08/2017 15:30:41 [name] ([id number]) -------------------------------------------------------------------------------- Vitals Details Patient Name: [name] Date of Service: 04/08/2017 3:00 PM Medical Record Number: [id number] Patient Account Number: [id number] Date of Birth/Sex: 25-Feb-1931 (83 y.o. Female) Treating RN:  Cornell Barman Primary Care Shakema Surita: [name] Other Clinician: Referring Tammie Yanda: [name] Treating [place]/Extender: Melburn Hake, HOYT Weeks in Treatment: 30 Vital Signs Time Taken: 15:20 Temperature (F): 98.1 Height (in): 63  Pulse (bpm): 79 Weight (lbs): 160 Respiratory Rate (breaths/min): 16 Body Mass Index (BMI): 28.3 Blood Pressure (mmHg): 129/75 Reference Range: 80 - 120 mg / dl Electronic Signature(s) Signed: 04/08/2017 4:40:54 PM By: Gretta Cool, BSN, RN, CWS, Kim RN, BSN Entered By: Gretta Cool, BSN, RN, CWS, Kim on 04/08/2017 15:21:12

## 2017-04-14 ENCOUNTER — Encounter: Payer: Medicare HMO | Admitting: Surgery

## 2017-04-14 DIAGNOSIS — L89620 Pressure ulcer of left heel, unstageable: Secondary | ICD-10-CM | POA: Diagnosis not present

## 2017-04-18 NOTE — Progress Notes (Signed)
EMELI, GOGUEN ([id number]) Visit Report for 04/14/2017 Arrival Information Details Patient Name: [name], [name]. Date of Service: 04/14/2017 3:00 PM Medical Record Number: [id number] Patient Account Number: [id number] Date of Birth/Sex: September 02, 1931 (83 y.o. Female) Treating RN: Afful, RN, BSN, Velva Harman Primary Care Aveion Nguyen: [name] Other Clinician: Referring Demosthenes Virnig: [name] Treating Kamayah Pillay/Extender: Frann Rider in Treatment: 34 Visit Information History Since Last Visit All ordered tests and consults were completed: No Patient Arrived: Wheel Chair Added or deleted any medications: No Arrival Time: 15:27 Any new allergies or adverse reactions: No Accompanied By: sister in Had a fall or experienced change in No lobby activities of daily living that may affect risk of falls: Transfer Assistance: [employer] Signs or symptoms of abuse/neglect since last No Patient Identification Verified: Yes visito Secondary Verification Process Yes Hospitalized since last visit: No Completed: Has Dressing in Place as Prescribed: Yes Patient Requires Transmission-Based No Precautions: Pain Present Now: No Patient Has Alerts: No Electronic Signature(s) Signed: 04/14/2017 4:33:19 PM By: Regan Lemming BSN, RN Entered By: Regan Lemming on 04/14/2017 15:28:21 [name] ([id number]) -------------------------------------------------------------------------------- Encounter Discharge Information Details Patient Name: [name]. Date of Service: 04/14/2017 3:00 PM Medical Record Number: [id number] Patient Account Number: [id number] Date of Birth/Sex: 03-02-1931 (83 y.o. Female) Treating RN: Afful, RN, BSN, Velva Harman Primary Care Elya Tarquinio: [name] Other Clinician: Referring Laporchia Nakajima: [name] Treating Solangel Mcmanaway/Extender: Frann Rider in Treatment: 31 Encounter Discharge Information Items Discharge Pain Level: 0 Discharge Condition: Stable Ambulatory  Status: Wheelchair Discharge Destination: Home Transportation: Other Accompanied By: sister Schedule Follow-up Appointment: No Medication Reconciliation completed and provided to Patient/Care No Theodus Ran: Provided on Clinical Summary of Care: 04/14/2017 Form Type Recipient Paper Patient EB Electronic Signature(s) Signed: 04/14/2017 4:28:16 PM By: Regan Lemming BSN, RN Previous Signature: 04/14/2017 4:10:26 PM Version By: Ruthine Dose Entered By: Regan Lemming on 04/14/2017 16:28:16 [name] ([id number]) -------------------------------------------------------------------------------- Lower Extremity Assessment Details Patient Name: [name]. Date of Service: 04/14/2017 3:00 PM Medical Record Number: [id number] Patient Account Number: [id number] Date of Birth/Sex: 1931/01/14 (83 y.o. Female) Treating RN: Afful, RN, BSN, Velva Harman Primary Care Samil Mecham: [name] Other Clinician: Referring Yassmine Tamm: [name] Treating Isabele Lollar/Extender: Frann Rider in Treatment: 31 Vascular Assessment Pulses: Dorsalis Pedis Palpable: [Left:Yes] [Right:Yes] Posterior Tibial Extremity colors, hair growth, and conditions: Extremity Color: [Left:Dusky] [Right:Dusky] Hair Growth on Extremity: [Left:No] [Right:No] Temperature of Extremity: [Left:Warm] [Right:Warm] Capillary Refill: [Left:< 3 seconds] [Right:< 3 seconds] Toe Nail Assessment Left: Right: Thick: Yes Yes Discolored: Yes Yes Deformed: Yes Yes Improper Length and Hygiene: Yes Yes Electronic Signature(s) Signed: 04/14/2017 4:33:19 PM By: Regan Lemming BSN, RN Entered By: Regan Lemming on 04/14/2017 15:40:15 [name] ([id number]) -------------------------------------------------------------------------------- Multi Wound Chart Details Patient Name: [name]. Date of Service: 04/14/2017 3:00 PM Medical Record Number: [id number] Patient Account Number: [id number] Date of Birth/Sex: 1931-06-18 (83 y.o.  Female) Treating RN: Baruch Gouty, RN, BSN, Velva Harman Primary Care Roddie Riegler: [name] Other Clinician: Referring Francie Keeling: [name] Treating Selwyn Reason/Extender: Frann Rider in Treatment: 31 Vital Signs Height(in): 63 Pulse(bpm): 64 Weight(lbs): 160 Blood Pressure 155/62 (mmHg): Body Mass Index(BMI): 28 Temperature(F): 98.0 Respiratory Rate 16 (breaths/min): Photos: [1:No Photos] [2:No Photos] [N/A:N/A] Wound Location: [1:Right Calcaneus - Medial] [2:Left Calcaneus - Medial] [N/A:N/A] Wounding Event: [1:Pressure Injury] [2:Pressure Injury] [N/A:N/A] Primary Etiology: [1:Pressure Ulcer] [2:Pressure Ulcer] [N/A:N/A] Comorbid History: [1:Cataracts, Hypertension] [2:Cataracts, Hypertension] [N/A:N/A] Date Acquired: [1:07/02/2016] [2:07/02/2016] [N/A:N/A] Weeks of Treatment: [1:31] [2:31] [N/A:N/A] Wound Status: [1:Open] [2:Open] [N/A:N/A] Measurements L x W x D  0.3x1x0.4 [2:4x3.2x0.4] [N/A:N/A] (cm) Area (cm) : [1:0.236] [2:10.053] [N/A:N/A] Volume (cm) : [1:0.094] [2:4.021] [N/A:N/A] % Reduction in Area: [1:98.10%] [2:49.20%] [N/A:N/A] % Reduction in Volume: 92.40% [2:-103.20%] [N/A:N/A] Classification: [1:Category/Stage III] [2:Category/Stage III] [N/A:N/A] Exudate Amount: [1:Medium] [2:Large] [N/A:N/A] Exudate Type: [1:Serosanguineous] [2:Serosanguineous] [N/A:N/A] Exudate Color: [1:red, brown] [2:red, brown] [N/A:N/A] Wound Margin: [1:Flat and Intact] [2:Flat and Intact] [N/A:N/A] Granulation Amount: [1:Large (67-100%)] [2:Large (67-100%)] [N/A:N/A] Granulation Quality: [1:Pink, Pale] [2:Red, Pink] [N/A:N/A] Necrotic Amount: [1:None Present (0%)] [2:Small (1-33%)] [N/A:N/A] Exposed Structures: [1:Fat Layer (Subcutaneous Tissue) Exposed: Yes Fascia: No Tendon: No Muscle: No Joint: No Bone: No] [2:Fat Layer (Subcutaneous Tissue) Exposed: Yes Fascia: No Tendon: No Muscle: No Joint: No Bone: No] [N/A:N/A] Epithelialization: [1:Large (67-100%)] [2:None]  [N/A:N/A] Periwound Skin Texture: Excoriation: No Excoriation: No N/A Induration: No Induration: No Callus: No Callus: No Crepitus: No Crepitus: No Rash: No Rash: No Scarring: No Scarring: No Periwound Skin Maceration: No Maceration: No N/A Moisture: Dry/Scaly: No Dry/Scaly: No Periwound Skin Color: Atrophie Blanche: No Atrophie Blanche: No N/A Cyanosis: No Cyanosis: No Ecchymosis: No Ecchymosis: No Erythema: No Erythema: No Hemosiderin Staining: No Hemosiderin Staining: No Mottled: No Mottled: No Pallor: No Pallor: No Rubor: No Rubor: No Temperature: No Abnormality No Abnormality N/A Tenderness on Yes No N/A Palpation: Wound Preparation: Ulcer Cleansing: Ulcer Cleansing: N/A Rinsed/Irrigated with Rinsed/Irrigated with Saline Saline Topical Anesthetic Topical Anesthetic Applied: Other: lidocaine Applied: Other: lidocaine 4% 4% Treatment Notes Electronic Signature(s) Signed: 04/14/2017 4:33:19 PM By: Regan Lemming BSN, RN Entered By: Regan Lemming on 04/14/2017 15:51:47 [name] ([id number]) -------------------------------------------------------------------------------- [place] Details Patient Name: [name] Date of Service: 04/14/2017 3:00 PM Medical Record Number: [id number] Patient Account Number: [id number] Date of Birth/Sex: June 09, 1931 (83 y.o. Female) Treating RN: Afful, RN, BSN, Velva Harman Primary Care Shavana Calder: [name] Other Clinician: Referring Jennfer Gassen: [name] Treating Fabianna Keats/Extender: Frann Rider in Treatment: 31 Active Inactive ` Abuse / Safety / Falls / Self Care Management Nursing Diagnoses: Impaired physical mobility Potential for falls Goals: Patient will remain injury free Date Initiated: 09/06/2016 Target Resolution Date: 12/02/2016 Goal Status: Active Interventions: Assess fall risk on admission and as needed Assess self care needs on admission and as  needed Notes: ` Necrotic Tissue Nursing Diagnoses: Impaired tissue integrity related to necrotic/devitalized tissue Goals: Necrotic/devitalized tissue will be minimized in the wound bed Date Initiated: 09/06/2016 Target Resolution Date: 12/02/2016 Goal Status: Active Interventions: Assess patient pain level pre-, during and post procedure and prior to discharge Notes: ` Orientation to the Wound Care Program Nursing Diagnoses: DERIAN, PFOST ([id number]) Knowledge deficit related to the wound healing center program Goals: Patient/caregiver will verbalize understanding of the [place] Program Date Initiated: 09/06/2016 Target Resolution Date: 12/02/2016 Goal Status: Active Interventions: Provide education on orientation to the wound center Notes: ` Pressure Nursing Diagnoses: Knowledge deficit related to management of pressures ulcers Potential for impaired tissue integrity related to pressure, friction, moisture, and shear Goals: Patient will remain free of pressure ulcers Date Initiated: 09/06/2016 Target Resolution Date: 12/02/2016 Goal Status: Active Interventions: Assess: immobility, friction, shearing, incontinence upon admission and as needed Notes: ` Soft Tissue Infection Nursing Diagnoses: Impaired tissue integrity Potential for infection: soft tissue Goals: Patient will remain free of wound infection Date Initiated: 09/06/2016 Target Resolution Date: 12/02/2016 Goal Status: Active Interventions: Assess signs and symptoms of infection every visit Notes: ` Wound/Skin Impairment KAZZANDRA, DESAULNIERS ([id number]) Nursing Diagnoses: Impaired tissue integrity Goals: Ulcer/skin breakdown will heal within 14 weeks Date Initiated: 09/06/2016 Target Resolution Date:  12/16/2016 Goal Status: Active Interventions: Assess ulceration(s) every visit Notes: Electronic Signature(s) Signed: 04/14/2017 4:33:19 PM By: Regan Lemming BSN, RN Entered By: Regan Lemming  on 04/14/2017 15:51:36 [name] ([id number]) -------------------------------------------------------------------------------- Pain Assessment Details Patient Name: [name]. Date of Service: 04/14/2017 3:00 PM Medical Record Number: [id number] Patient Account Number: [id number] Date of Birth/Sex: [birth year] (83 y.o. Female) Treating RN: Afful, RN, BSN, Velva Harman Primary Care Kelis Plasse: [name] Other Clinician: Referring Anahis Furgeson: [name] Treating Steel Kerney/Extender: Frann Rider in Treatment: 31 Active Problems Location of Pain Severity and Description of Pain Patient Has Paino Yes Site Locations Pain Location: Pain in Ulcers Rate the pain. Current Pain Level: 2 Character of Pain Describe the Pain: Tender Pain Management and Medication Current Pain Management: Electronic Signature(s) Signed: 04/14/2017 4:33:19 PM By: Regan Lemming BSN, RN Entered By: Regan Lemming on 04/14/2017 15:28:33 [name] ([id number]) -------------------------------------------------------------------------------- Patient/Caregiver Education Details Patient Name: [name]. Date of Service: 04/14/2017 3:00 PM Medical Record Number: [id number] Patient Account Number: [id number] Date of Birth/Gender: 06-20-1931 (83 y.o. Female) Treating RN: Afful, RN, BSN, Velva Harman Primary Care Physician: [name] Other Clinician: Referring Physician: [name] Treating Physician/Extender: Frann Rider in Treatment: 49 Education Assessment Education Provided To: Patient and Caregiver Education Topics Provided Offloading: Methods: Explain/Verbal Responses: Reinforcements needed Pressure: Methods: Explain/Verbal Responses: Reinforcements needed Welcome [place]: Methods: Explain/Verbal Responses: Reinforcements needed Wound Debridement: Methods: Explain/Verbal Responses: Reinforcements needed Wound/Skin Impairment: Methods:  Explain/Verbal Responses: Reinforcements needed Electronic Signature(s) Signed: 04/14/2017 4:33:19 PM By: Regan Lemming BSN, RN Entered By: Regan Lemming on 04/14/2017 16:28:51 [name] ([id number]) -------------------------------------------------------------------------------- Wound Assessment Details Patient Name: [name]. Date of Service: 04/14/2017 3:00 PM Medical Record Number: [id number] Patient Account Number: [id number] Date of Birth/Sex: 10/09/1931 (83 y.o. Female) Treating RN: Afful, RN, BSN, [place] Primary Care Zriyah Kopplin: [name] Other Clinician: Referring Clarice Zulauf: [name] Treating Chivon Lepage/Extender: Frann Rider in Treatment: 31 Wound Status Wound Number: 1 Primary Etiology: Pressure Ulcer Wound Location: Right Calcaneus - Medial Wound Status: Open Wounding Event: Pressure Injury Comorbid History: Cataracts, Hypertension Date Acquired: 07/02/2016 Weeks Of Treatment: 31 Clustered Wound: No Photos Photo Uploaded By: Regan Lemming on 04/14/2017 16:30:52 Wound Measurements Length: (cm) 0.3 Width: (cm) 1 Depth: (cm) 0.4 Area: (cm) 0.236 Volume: (cm) 0.094 % Reduction in Area: 98.1% % Reduction in Volume: 92.4% Epithelialization: Large (67-100%) Tunneling: No Undermining: No Wound Description Classification: Category/Stage III Foul Odor Aft Wound Margin: Flat and Intact Slough/Fibrin Exudate Amount: Medium Exudate Type: Serosanguineous Exudate Color: red, brown er Cleansing: No o No Wound Bed Granulation Amount: Large (67-100%) Exposed Structure Granulation Quality: Pink, Pale Fascia Exposed: No Necrotic Amount: None Present (0%) Fat Layer (Subcutaneous Tissue) Exposed: Yes Tendon Exposed: No Rakers, Zayne S. ([id number]) Muscle Exposed: No Joint Exposed: No Bone Exposed: No Periwound Skin Texture Texture Color No Abnormalities Noted: No No Abnormalities Noted: No Callus: No Atrophie Blanche: No Crepitus:  No Cyanosis: No Excoriation: No Ecchymosis: No Induration: No Erythema: No Rash: No Hemosiderin Staining: No Scarring: No Mottled: No Pallor: No Moisture Rubor: No No Abnormalities Noted: No Dry / Scaly: No Temperature / Pain Maceration: No Temperature: No Abnormality Tenderness on Palpation: Yes Wound Preparation Ulcer Cleansing: Rinsed/Irrigated with Saline Topical Anesthetic Applied: Other: lidocaine 4%, Treatment Notes Wound #1 (Right, Medial Calcaneus) 1. Cleansed with: Clean wound with Normal Saline 4. Dressing Applied: Aquacel Ag Hydrogel Other dressing (specify in notes) 5. Secondary Dressing Applied Guaze, ABD and kerlix/Conform 7. Secured with Tape Notes  SORBACT AND HYDROGEL TO RIGHT HEEL Electronic Signature(s) Signed: 04/14/2017 4:33:19 PM By: Regan Lemming BSN, RN Entered By: Regan Lemming on 04/14/2017 15:38:51 [name] ([id number]) -------------------------------------------------------------------------------- Wound Assessment Details Patient Name: [name]. Date of Service: 04/14/2017 3:00 PM Medical Record Number: [id number] Patient Account Number: [id number] Date of Birth/Sex: Aug 29, 1931 (83 y.o. Female) Treating RN: Afful, RN, BSN, [place] Primary Care Birdella Sippel: [name] Other Clinician: Referring Shivonne Schwartzman: [name] Treating Remijio Holleran/Extender: Frann Rider in Treatment: 31 Wound Status Wound Number: 2 Primary Etiology: Pressure Ulcer Wound Location: Left Calcaneus - Medial Wound Status: Open Wounding Event: Pressure Injury Comorbid History: Cataracts, Hypertension Date Acquired: 07/02/2016 Weeks Of Treatment: 31 Clustered Wound: No Photos Photo Uploaded By: Regan Lemming on 04/14/2017 16:30:52 Wound Measurements Length: (cm) 4 Width: (cm) 3.2 Depth: (cm) 0.4 Area: (cm) 10.053 Volume: (cm) 4.021 % Reduction in Area: 49.2% % Reduction in Volume: -103.2% Epithelialization: None Tunneling:  No Undermining: No Wound Description Classification: Category/Stage III Foul Odor Aft Wound Margin: Flat and Intact Slough/Fibrin Exudate Amount: Large Exudate Type: Serosanguineous Exudate Color: red, brown er Cleansing: No o Yes Wound Bed Granulation Amount: Large (67-100%) Exposed Structure Granulation Quality: Red, Pink Fascia Exposed: No Necrotic Amount: Small (1-33%) Fat Layer (Subcutaneous Tissue) Exposed: Yes Necrotic Quality: Adherent Slough Tendon Exposed: No Catanese, Antonetta S. ([id number]) Muscle Exposed: No Joint Exposed: No Bone Exposed: No Periwound Skin Texture Texture Color No Abnormalities Noted: No No Abnormalities Noted: No Callus: No Atrophie Blanche: No Crepitus: No Cyanosis: No Excoriation: No Ecchymosis: No Induration: No Erythema: No Rash: No Hemosiderin Staining: No Scarring: No Mottled: No Pallor: No Moisture Rubor: No No Abnormalities Noted: No Dry / Scaly: No Temperature / Pain Maceration: No Temperature: No Abnormality Wound Preparation Ulcer Cleansing: Rinsed/Irrigated with Saline Topical Anesthetic Applied: Other: lidocaine 4%, Treatment Notes Wound #2 (Left, Medial Calcaneus) 1. Cleansed with: Clean wound with Normal Saline 4. Dressing Applied: Aquacel Ag Hydrogel Other dressing (specify in notes) 5. Secondary Dressing Applied Guaze, ABD and kerlix/Conform 7. Secured with Tape Notes SORBACT AND HYDROGEL TO RIGHT HEEL Electronic Signature(s) Signed: 04/14/2017 4:33:19 PM By: Regan Lemming BSN, RN Entered By: Regan Lemming on 04/14/2017 15:39:33 [name] ([id number]) -------------------------------------------------------------------------------- Vitals Details Patient Name: [name] Date of Service: 04/14/2017 3:00 PM Medical Record Number: [id number] Patient Account Number: [id number] Date of Birth/Sex: October 22, 1930 (83 y.o. Female) Treating RN: Afful, RN, BSN, [place] Primary Care Dantavious Snowball: [name] Other  Clinician: Referring Abdirahim Flavell: [name] Treating Desirae Mancusi/Extender: Frann Rider in Treatment: 31 Vital Signs Time Taken: 15:28 Temperature (F): 98.0 Height (in): 63 Pulse (bpm): 64 Weight (lbs): 160 Respiratory Rate (breaths/min): 16 Body Mass Index (BMI): 28.3 Blood Pressure (mmHg): 155/62 Reference Range: 80 - 120 mg / dl Electronic Signature(s) Signed: 04/14/2017 4:33:19 PM By: Regan Lemming BSN, RN Entered By: Regan Lemming on 04/14/2017 15:28:58

## 2017-04-18 NOTE — Progress Notes (Signed)
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 throughout and a pain level of 2 following the procedure. Post Debridement Measurements: 4cm length x 3.2cm width x 0.4cm depth; 4.021cm^3 volume. Post debridement Stage noted as Category/Stage III. Character of Wound/Ulcer Post Debridement requires further debridement. Post procedure Diagnosis Wound #2: Same as Pre-Procedure Plan Wound Cleansing: Wound #1 Right,Medial Calcaneus: Clean wound with Normal Saline. Wound #2 Left,Medial Calcaneus: Clean wound with Normal Saline. Anesthetic: Wound #1 Right,Medial Calcaneus: Topical Lidocaine 4% cream applied to wound bed prior to debridement - In clinic only Wound #2 Left,Medial Calcaneus: Topical Lidocaine 4% cream applied to wound bed prior to debridement - In clinic only Skin Barriers/Peri-Wound Care: Wound #1 Right,Medial Calcaneus: Skin Prep Wound #2 Left,Medial Calcaneus: Skin Prep [name], [name] ([id number]) Primary Wound Dressing: Wound #1 Right,Medial Calcaneus: Hydrogel Cutimed Sorbact - MEDICATION SENT WITH PATIENT. Wound #2 Left,Medial Calcaneus: Aquacel Ag Secondary Dressing: Wound #2 Left,Medial Calcaneus: ABD and Kerlix/Conform Dressing Change Frequency: Wound #1 Right,Medial Calcaneus: Change  dressing every other day. Wound #2 Left,Medial Calcaneus: Change dressing every other day. Follow-up Appointments: Wound #1 Right,Medial Calcaneus: Return Appointment in 2 weeks. Wound #2 Left,Medial Calcaneus: Return Appointment in 2 weeks. Edema Control: Wound #1 Right,Medial Calcaneus: Elevate legs to the level of the heart and pump ankles as often as possible Wound #2 Left,Medial Calcaneus: Elevate legs to the level of the heart and pump ankles as often as possible Off-Loading: Wound #1 Right,Medial Calcaneus: Other: - No pressure on heels!!! Float heel while in chair or bed. Sage boots at night along with floating heels. Wound #2 Left,Medial Calcaneus: Other: - No pressure on heels!!! Float heel while in chair or bed. Sage boots at night along with floating heels. Additional Orders / Instructions: Wound #1 Right,Medial Calcaneus: Increase protein intake. Other: - Vitamins A, C and Zinc Wound #2 Left,Medial Calcaneus: Increase protein intake. Other: - Vitamins A, C and Zinc Home Health: Wound #1 Right,Medial Calcaneus: Continue Home Health Visits - [place] Nurse may visit PRN to address patient s wound care needs. FACE TO FACE ENCOUNTER: MEDICARE and MEDICAID PATIENTS: I certify that this patient is under my care and that I had a face-to-face encounter that meets the physician face-to-face encounter requirements with this patient on this date. The encounter with the patient was in whole or in part for the following MEDICAL CONDITION: (primary reason for [place]) MEDICAL NECESSITY: I certify, that based on my findings, NURSING services are a medically necessary home health service. HOME BOUND STATUS: I certify that my clinical findings support that this patient is homebound (i.e., Due to illness or injury, pt requires aid of supportive devices such as crutches, cane, wheelchairs, walkers, the use of special transportation or the assistance of another  person to leave their place of residence. There is a normal inability to leave the home and doing so requires considerable and taxing effort. Other absences are [name], [name] ([id number]) for medical reasons / religious services and are infrequent or of  short duration when for other reasons). If current dressing causes regression in wound condition, may D/C ordered dressing product/s and apply Normal Saline Moist Dressing daily until next [place] / Other MD appointment. [place] of regression in wound condition at [phone]. Please direct any NON-WOUND related issues/requests for orders to patient's Primary Care Physician Wound #2 Left,Medial Calcaneus: [place] Visits - [place] Nurse may visit PRN to address patient s wound care needs. FACE TO FACE ENCOUNTER: MEDICARE and MEDICAID PATIENTS: I certify that this patient is under my care and that I had a face-to-face encounter that meets the physician face-to-face encounter requirements with this patient on this date. The encounter with the patient was in whole or in part for the following MEDICAL CONDITION: (primary reason for [place]) MEDICAL NECESSITY: I certify, that based on my findings, NURSING services are a medically necessary home health service. HOME BOUND STATUS: I certify that my clinical findings support that this patient is homebound (i.e., Due to illness or injury, pt requires aid of supportive devices such as crutches, cane, wheelchairs, walkers, the use of special transportation or the assistance of another person to leave their place of residence. There is a normal inability to leave the home and doing so requires considerable and taxing effort. Other absences are for medical reasons / religious services and are infrequent or of short duration when for other reasons). If current dressing causes regression in wound condition, may D/C ordered dressing product/s  and apply Normal Saline Moist Dressing daily until next [place] / Other MD appointment. [place] of regression in wound condition at [phone]. Please direct any NON-WOUND related issues/requests for orders to patient's Primary Care Physician after review today I have recommended: 1. Sorbact with hydrogel to be applied to the right heel wound and this can be changed every other day with an appropriate offloading technique. 2. for the left medial heel sharp dissection was done and we'll continue with silver alginate, on alternate days with an offloading technique 3. Continue with adequate protein, vitamin A, vitamin C and zinc 4. Regular visits to the wound center Electronic Signature(s) Signed: 04/15/2017 4:16:09 PM By: Christin Fudge MD, FACS Previous Signature: 04/14/2017 3:59:17 PM Version By: Christin Fudge MD, FACS Entered By: Christin Fudge on 04/15/2017 16:16:09 [name] ([id number]) -------------------------------------------------------------------------------- SuperBill Details Patient Name: [name] Date of Service: 04/14/2017 Medical Record Number: [id number] Patient Account Number: [id number] Date of Birth/Sex: January 14, 1931 (83 y.o. 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 Pressure ulcer of left buttock, stage 2 Facility Procedures CPT4 Code: [id number] Description: 34742 - DEB SUBQ TISSUE 20 SQ CM/< ICD-10 Description Diagnosis L89.623 Pressure ulcer of left heel, stage 3 L89.613 Pressure ulcer of right heel, stage 3 Z99.3 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 

## 2017-04-26 ENCOUNTER — Telehealth: Payer: Self-pay | Admitting: Urology

## 2017-04-26 NOTE — Telephone Encounter (Signed)
Patient is calling and asking for samples of myrbetriq, I told her we did not have any right now. I told her if she was able to get any and if we got any in this week we would call her. Can you please check and see if she is able to get more samples and call her? She can be reached at [phone] Thanks, Sharyn Lull

## 2017-04-28 ENCOUNTER — Encounter: Payer: Medicare HMO | Attending: Physician Assistant | Admitting: Physician Assistant

## 2017-04-28 DIAGNOSIS — N189 Chronic kidney disease, unspecified: Secondary | ICD-10-CM | POA: Diagnosis not present

## 2017-04-28 DIAGNOSIS — Z993 Dependence on wheelchair: Secondary | ICD-10-CM | POA: Diagnosis not present

## 2017-04-28 DIAGNOSIS — L89613 Pressure ulcer of right heel, stage 3: Secondary | ICD-10-CM | POA: Diagnosis not present

## 2017-04-28 DIAGNOSIS — L89322 Pressure ulcer of left buttock, stage 2: Secondary | ICD-10-CM | POA: Insufficient documentation

## 2017-04-28 DIAGNOSIS — I129 Hypertensive chronic kidney disease with stage 1 through stage 4 chronic kidney disease, or unspecified chronic kidney disease: Secondary | ICD-10-CM | POA: Diagnosis not present

## 2017-04-28 DIAGNOSIS — L89623 Pressure ulcer of left heel, stage 3: Secondary | ICD-10-CM | POA: Diagnosis not present

## 2017-04-28 DIAGNOSIS — D649 Anemia, unspecified: Secondary | ICD-10-CM | POA: Insufficient documentation

## 2017-04-30 NOTE — Progress Notes (Signed)
MAYVIS, AGUDELO ([id number]) Visit Report for 04/28/2017 Arrival Information Details Patient Name: [name], [name]. Date of Service: 04/28/2017 3:45 PM Medical Record Number: [id number] Patient Account Number: [id number] Date of Birth/Sex: Nov 22, 1930 (83 y.o. Female) Treating RN: Afful, RN, BSN, Velva Harman Primary Care Joselynne Killam: [name] Other Clinician: Referring Elisavet Buehrer: [name] Treating Samik Balkcom/Extender: Melburn Hake, HOYT Weeks in Treatment: 7 Visit Information History Since Last Visit All ordered tests and consults were completed: No Patient Arrived: Wheel Chair Added or deleted any medications: No Arrival Time: 15:54 Any new allergies or adverse reactions: No Accompanied By: self Had a fall or experienced change in No activities of daily living that may affect Transfer Assistance: Harrel Lemon Lift risk of falls: Patient Identification Verified: Yes Signs or symptoms of abuse/neglect since last No Secondary Verification Process Yes visito Completed: Hospitalized since last visit: No Patient Requires Transmission-Based No Has Dressing in Place as Prescribed: Yes Precautions: Pain Present Now: Yes Patient Has Alerts: No Electronic Signature(s) Signed: 04/28/2017 5:16:18 PM By: Regan Lemming BSN, RN Entered By: Regan Lemming on 04/28/2017 15:56:41 [name] ([id number]) -------------------------------------------------------------------------------- Encounter Discharge Information Details Patient Name: [name]. Date of Service: 04/28/2017 3:45 PM Medical Record Number: [id number] Patient Account Number: [id number] Date of Birth/Sex: 01/29/1931 (83 y.o. Female) Treating RN: Afful, RN, BSN, Velva Harman Primary Care Londynn Sonoda: [name] Other Clinician: Referring Neela Zecca: [name] Treating Wilma Wuthrich/Extender: Melburn Hake, HOYT Weeks in Treatment: 68 Encounter Discharge Information Items Discharge Pain Level: 0 Discharge Condition: Stable Ambulatory  Status: Wheelchair Discharge Destination: Home Transportation: Other Accompanied By: self Schedule Follow-up Appointment: No Medication Reconciliation completed and provided to Patient/Care No Shea Swalley: Provided on Clinical Summary of Care: 04/28/2017 Form Type Recipient Paper Patient EB Electronic Signature(s) Signed: 04/28/2017 5:15:03 PM By: Regan Lemming BSN, RN Previous Signature: 04/28/2017 4:50:04 PM Version By: Ruthine Dose Entered By: Regan Lemming on 04/28/2017 17:15:03 [name] ([id number]) -------------------------------------------------------------------------------- Lower Extremity Assessment Details Patient Name: [name]. Date of Service: 04/28/2017 3:45 PM Medical Record Number: [id number] Patient Account Number: [id number] Date of Birth/Sex: 1931-01-26 (83 y.o. Female) Treating RN: Afful, RN, BSN, Velva Harman Primary Care Tala Eber: [name] Other Clinician: Referring Joliene Salvador: [name] Treating Niamya Vittitow/Extender: Melburn Hake, HOYT Weeks in Treatment: 33 Vascular Assessment Pulses: Dorsalis Pedis Palpable: [Left:Yes] [Right:Yes] Posterior Tibial Extremity colors, hair growth, and conditions: Extremity Color: [Left:Dusky] [Right:Dusky] Hair Growth on Extremity: [Left:No] [Right:No] Temperature of Extremity: [Left:Warm] [Right:Warm] Capillary Refill: [Left:< 3 seconds] [Right:< 3 seconds] Toe Nail Assessment Left: Right: Thick: Yes Yes Discolored: Yes Yes Deformed: Yes Yes Improper Length and Hygiene: Yes Yes Electronic Signature(s) Signed: 04/28/2017 5:16:18 PM By: Regan Lemming BSN, RN Entered By: Regan Lemming on 04/28/2017 16:33:35 [name] ([id number]) -------------------------------------------------------------------------------- Multi Wound Chart Details Patient Name: [name]. Date of Service: 04/28/2017 3:45 PM Medical Record Number: [id number] Patient Account Number: [id number] Date of Birth/Sex: [birth year] (83 y.o.  Female) Treating RN: Afful, RN, BSN, Velva Harman Primary Care Cayle Cordoba: [name] Other Clinician: Referring Arnola Crittendon: [name] Treating Felix Meras/Extender: Melburn Hake, HOYT Weeks in Treatment: 33 Vital Signs Height(in): 63 Pulse(bpm): 66 Weight(lbs): 160 Blood Pressure 138/55 (mmHg): Body Mass Index(BMI): 28 Temperature(F): 98.7 Respiratory Rate 16 (breaths/min): Photos: [N/A:N/A] Wound Location: Right Calcaneus - Medial Left Calcaneus - Medial N/A Wounding Event: Pressure Injury Pressure Injury N/A Primary Etiology: Pressure Ulcer Pressure Ulcer N/A Comorbid History: Cataracts, Hypertension Cataracts, Hypertension N/A Date Acquired: 07/02/2016 07/02/2016 N/A Weeks of Treatment: 33 33 N/A Wound Status: Open Open N/A Measurements L x W x D 0.5x0.7x0.3 4.3x3.5x0.4  N/A (cm) Area (cm) : 0.275 11.82 N/A Volume (cm) : 0.082 4.728 N/A % Reduction in Area: 97.80% 40.30% N/A % Reduction in Volume: 93.40% -138.90% N/A Classification: Category/Stage III Category/Stage III N/A Exudate Amount: Medium Large N/A Exudate Type: Serosanguineous Serosanguineous N/A Exudate Color: red, brown red, brown N/A Wound Margin: Flat and Intact Flat and Intact N/A Granulation Amount: Large (67-100%) Medium (34-66%) N/A Granulation Quality: Pink, Pale Red, Pink N/A Necrotic Amount: Small (1-33%) Small (1-33%) N/A Exposed Structures: N/A EVETTA, RENNER ([id number]) Fat Layer (Subcutaneous Fat Layer (Subcutaneous Tissue) Exposed: Yes Tissue) Exposed: Yes Fascia: No Fascia: No Tendon: No Tendon: No Muscle: No Muscle: No Joint: No Joint: No Bone: No Bone: No Epithelialization: Large (67-100%) None N/A Periwound Skin Texture: Excoriation: No Excoriation: No N/A Induration: No Induration: No Callus: No Callus: No Crepitus: No Crepitus: No Rash: No Rash: No Scarring: No Scarring: No Periwound Skin Maceration: No Maceration: No N/A Moisture: Dry/Scaly: No Dry/Scaly:  No Periwound Skin Color: Hemosiderin Staining: Yes Hemosiderin Staining: Yes N/A Mottled: Yes Mottled: Yes Atrophie Blanche: No Atrophie Blanche: No Cyanosis: No Cyanosis: No Ecchymosis: No Ecchymosis: No Erythema: No Erythema: No Pallor: No Pallor: No Rubor: No Rubor: No Temperature: No Abnormality No Abnormality N/A Tenderness on Yes Yes N/A Palpation: Wound Preparation: Ulcer Cleansing: Ulcer Cleansing: N/A Rinsed/Irrigated with Rinsed/Irrigated with Saline Saline Topical Anesthetic Topical Anesthetic Applied: Other: lidocaine Applied: Other: lidocaine 4% 4% Treatment Notes Electronic Signature(s) Signed: 04/28/2017 5:16:18 PM By: Regan Lemming BSN, RN Entered By: Regan Lemming on 04/28/2017 16:34:19 [name] ([id number]) -------------------------------------------------------------------------------- [facility] Details Patient Name: [name] Date of Service: 04/28/2017 3:45 PM Medical Record Number: [id number] Patient Account Number: [id number] Date of Birth/Sex: 05/15/1931 (83 y.o. Female) Treating RN: Afful, RN, BSN, Velva Harman Primary Care Nesta Kimple: [name] Other Clinician: Referring Clancy Leiner: [name] Treating Menucha Dicesare/Extender: Melburn Hake, HOYT Weeks in Treatment: 82 Active Inactive ` Abuse / Safety / Falls / Self Care Management Nursing Diagnoses: Impaired physical mobility Potential for falls Goals: Patient will remain injury free Date Initiated: 09/06/2016 Target Resolution Date: 12/02/2016 Goal Status: Active Interventions: Assess fall risk on admission and as needed Assess self care needs on admission and as needed Notes: ` Necrotic Tissue Nursing Diagnoses: Impaired tissue integrity related to necrotic/devitalized tissue Goals: Necrotic/devitalized tissue will be minimized in the wound bed Date Initiated: 09/06/2016 Target Resolution Date: 12/02/2016 Goal Status: Active Interventions: Assess patient  pain level pre-, during and post procedure and prior to discharge Notes: ` Orientation to the Wound Care Program Nursing Diagnoses: AKYLA, VAVREK ([id number]) Knowledge deficit related to the wound healing center program Goals: Patient/caregiver will verbalize understanding of the [facility] Date Initiated: 09/06/2016 Target Resolution Date: 12/02/2016 Goal Status: Active Interventions: Provide education on orientation to the wound center Notes: ` Pressure Nursing Diagnoses: Knowledge deficit related to management of pressures ulcers Potential for impaired tissue integrity related to pressure, friction, moisture, and shear Goals: Patient will remain free of pressure ulcers Date Initiated: 09/06/2016 Target Resolution Date: 12/02/2016 Goal Status: Active Interventions: Assess: immobility, friction, shearing, incontinence upon admission and as needed Notes: ` Soft Tissue Infection Nursing Diagnoses: Impaired tissue integrity Potential for infection: soft tissue Goals: Patient will remain free of wound infection Date Initiated: 09/06/2016 Target Resolution Date: 12/02/2016 Goal Status: Active Interventions: Assess signs and symptoms of infection every visit Notes: ` Wound/Skin Impairment ESTY, AHUJA ([id number]) Nursing Diagnoses: Impaired tissue integrity Goals: Ulcer/skin breakdown will heal within 14 weeks Date Initiated: 09/06/2016 Target  Resolution Date: 12/16/2016 Goal Status: Active Interventions: Assess ulceration(s) every visit Notes: Electronic Signature(s) Signed: 04/28/2017 5:16:18 PM By: Regan Lemming BSN, RN Entered By: Regan Lemming on 04/28/2017 16:34:03 [name] ([id number]) -------------------------------------------------------------------------------- Pain Assessment Details Patient Name: [name]. Date of Service: 04/28/2017 3:45 PM Medical Record Number: [id number] Patient Account Number: [id number] Date of  Birth/Sex: January 06, 1931 (83 y.o. Female) Treating RN: Afful, RN, BSN, Velva Harman Primary Care Lanitra Battaglini: [name] Other Clinician: Referring Kaloni Bisaillon: [name] Treating Toby Ayad/Extender: Melburn Hake, HOYT Weeks in Treatment: 33 Active Problems Location of Pain Severity and Description of Pain Patient Has Paino Yes Site Locations Pain Location: Pain in Ulcers With Dressing Change: Yes Rate the pain. Current Pain Level: 4 Character of Pain Describe the Pain: Tender Pain Management and Medication Current Pain Management: How does your wound impact your activities of daily livingo Sleep: Yes Bathing: Yes Appetite: Yes Relationship With Others: Yes Bladder Continence: Yes Emotions: Yes Bowel Continence: Yes Work: Yes Toileting: Yes Drive: Yes Dressing: Yes Hobbies: Yes Electronic Signature(s) Signed: 04/28/2017 5:16:18 PM By: Regan Lemming BSN, RN Entered By: Regan Lemming on 04/28/2017 15:57:03 [name] ([id number]) -------------------------------------------------------------------------------- Patient/Caregiver Education Details Patient Name: [name] Date of Service: 04/28/2017 3:45 PM Medical Record Number: [id number] Patient Account Number: [id number] Date of Birth/Gender: 02/23/1931 (83 y.o. Female) Treating RN: Afful, RN, BSN, Velva Harman Primary Care Physician: [name] Other Clinician: Referring Physician: [name] Treating Physician/Extender: Sharalyn Ink in Treatment: 25 Education Assessment Education Provided To: Patient Education Topics Provided Welcome To [facility]: Methods: Explain/Verbal Responses: Reinforcements needed Wound Debridement: Methods: Explain/Verbal Responses: Reinforcements needed Wound/Skin Impairment: Methods: Explain/Verbal Responses: Reinforcements needed Electronic Signature(s) Signed: 04/28/2017 5:16:18 PM By: Regan Lemming BSN, RN Entered By: Regan Lemming on 04/28/2017  17:15:33 [name] ([id number]) -------------------------------------------------------------------------------- Wound Assessment Details Patient Name: [name]. Date of Service: 04/28/2017 3:45 PM Medical Record Number: [id number] Patient Account Number: [id number] Date of Birth/Sex: [birth year] (83 y.o. Female) Treating RN: Afful, RN, BSN, Administrator, sports Primary Care Abelardo Seidner: [name] Other Clinician: Referring Maecyn Panning: [name] Treating Nathanie Ottley/Extender: Melburn Hake, HOYT Weeks in Treatment: 33 Wound Status Wound Number: 1 Primary Etiology: Pressure Ulcer Wound Location: Right Calcaneus - Medial Wound Status: Open Wounding Event: Pressure Injury Comorbid History: Cataracts, Hypertension Date Acquired: 07/02/2016 Weeks Of Treatment: 33 Clustered Wound: No Photos Photo Uploaded By: Regan Lemming on 04/28/2017 16:30:12 Wound Measurements Length: (cm) 0.5 Width: (cm) 0.7 Depth: (cm) 0.3 Area: (cm) 0.275 Volume: (cm) 0.082 % Reduction in Area: 97.8% % Reduction in Volume: 93.4% Epithelialization: Large (67-100%) Tunneling: No Undermining: No Wound Description Classification: Category/Stage III Foul Odor Aft Wound Margin: Flat and Intact Slough/Fibrin Exudate Amount: Medium Exudate Type: Serosanguineous Exudate Color: red, brown er Cleansing: No o Yes Wound Bed Granulation Amount: Large (67-100%) Exposed Structure Granulation Quality: Pink, Pale Fascia Exposed: No Necrotic Amount: Small (1-33%) Fat Layer (Subcutaneous Tissue) Exposed: Yes Necrotic Quality: Adherent Slough Tendon Exposed: No Scalia, Chinwe S. ([id number]) Muscle Exposed: No Joint Exposed: No Bone Exposed: No Periwound Skin Texture Texture Color No Abnormalities Noted: No No Abnormalities Noted: No Callus: No Atrophie Blanche: No Crepitus: No Cyanosis: No Excoriation: No Ecchymosis: No Induration: No Erythema: No Rash: No Hemosiderin Staining: Yes Scarring: No Mottled:  Yes Pallor: No Moisture Rubor: No No Abnormalities Noted: No Dry / Scaly: No Temperature / Pain Maceration: No Temperature: No Abnormality Tenderness on Palpation: Yes Wound Preparation Ulcer Cleansing: Rinsed/Irrigated with Saline Topical Anesthetic Applied: Other: lidocaine 4%, Treatment Notes Wound #  1 (Right, Medial Calcaneus) 1. Cleansed with: Clean wound with Normal Saline 4. Dressing Applied: Aquacel Ag Hydrogel Other dressing (specify in notes) 5. Secondary Dressing Applied Guaze, ABD and kerlix/Conform 7. Secured with Tape Notes SORBACT AND HYDROGEL TO RIGHT HEEL Electronic Signature(s) Signed: 04/28/2017 5:16:18 PM By: Regan Lemming BSN, RN Entered By: Regan Lemming on 04/28/2017 [place], [place]. ([id number]) -------------------------------------------------------------------------------- Wound Assessment Details Patient Name: [name]. Date of Service: 04/28/2017 3:45 PM Medical Record Number: [id number] Patient Account Number: [id number] Date of Birth/Sex: [birth year] (83 y.o. Female) Treating RN: Afful, RN, BSN, Administrator, sports Primary Care Dmya Long: [name] Other Clinician: Referring Janya Eveland: [name] Treating Ravina Milner/Extender: Melburn Hake, HOYT Weeks in Treatment: 33 Wound Status Wound Number: 2 Primary Etiology: Pressure Ulcer Wound Location: Left Calcaneus - Medial Wound Status: Open Wounding Event: Pressure Injury Comorbid History: Cataracts, Hypertension Date Acquired: 07/02/2016 Weeks Of Treatment: 33 Clustered Wound: No Photos Photo Uploaded By: Regan Lemming on 04/28/2017 16:31:19 Wound Measurements Length: (cm) 4.3 Width: (cm) 3.5 Depth: (cm) 0.4 Area: (cm) 11.82 Volume: (cm) 4.728 % Reduction in Area: 40.3% % Reduction in Volume: -138.9% Epithelialization: None Tunneling: No Undermining: No Wound Description Classification: Category/Stage III Foul Odor Aft Wound Margin: Flat and Intact Slough/Fibrin Exudate  Amount: Large Exudate Type: Serosanguineous Exudate Color: red, brown er Cleansing: No o Yes Wound Bed Granulation Amount: Medium (34-66%) Exposed Structure Granulation Quality: Red, Pink Fascia Exposed: No Necrotic Amount: Small (1-33%) Fat Layer (Subcutaneous Tissue) Exposed: Yes Necrotic Quality: Adherent Slough Tendon Exposed: No Cooler, Kaylianna S. ([id number]) Muscle Exposed: No Joint Exposed: No Bone Exposed: No Periwound Skin Texture Texture Color No Abnormalities Noted: No No Abnormalities Noted: No Callus: No Atrophie Blanche: No Crepitus: No Cyanosis: No Excoriation: No Ecchymosis: No Induration: No Erythema: No Rash: No Hemosiderin Staining: Yes Scarring: No Mottled: Yes Pallor: No Moisture Rubor: No No Abnormalities Noted: No Dry / Scaly: No Temperature / Pain Maceration: No Temperature: No Abnormality Tenderness on Palpation: Yes Wound Preparation Ulcer Cleansing: Rinsed/Irrigated with Saline Topical Anesthetic Applied: Other: lidocaine 4%, Treatment Notes Wound #2 (Left, Medial Calcaneus) 1. Cleansed with: Clean wound with Normal Saline 4. Dressing Applied: Aquacel Ag Hydrogel Other dressing (specify in notes) 5. Secondary Dressing Applied Guaze, ABD and kerlix/Conform 7. Secured with Tape Notes SORBACT AND HYDROGEL TO RIGHT HEEL Electronic Signature(s) Signed: 04/28/2017 5:16:18 PM By: Regan Lemming BSN, RN Entered By: Regan Lemming on 04/28/2017 16:10:50 [name] ([id number]) -------------------------------------------------------------------------------- Vitals Details Patient Name: [name] Date of Service: 04/28/2017 3:45 PM Medical Record Number: [id number] Patient Account Number: [id number] Date of Birth/Sex: 1931-05-14 (83 y.o. Female) Treating RN: Afful, RN, BSN, Velva Harman Primary Care Cas Tracz: [name] Other Clinician: Referring Devery Murgia: [name] Treating Seanmichael Salmons/Extender: Melburn Hake, HOYT Weeks in  Treatment: 33 Vital Signs Time Taken: 15:57 Temperature (F): 98.7 Height (in): 63 Pulse (bpm): 66 Weight (lbs): 160 Respiratory Rate (breaths/min): 16 Body Mass Index (BMI): 28.3 Blood Pressure (mmHg): 138/55 Reference Range: 80 - 120 mg / dl Electronic Signature(s) Signed: 04/28/2017 5:16:18 PM By: Regan Lemming BSN, RN Entered By: Regan Lemming on 04/28/2017 15:57:29

## 2017-04-30 NOTE — Progress Notes (Signed)
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 patient is able to make decisions and demonstrates good insight into disease process. Alert and Oriented x 3. pleasant and cooperative. General Notes: Neither of patients heal wounds appeared to be infected on evaluation today. Both did require sharp debridement which she tolerated well without complication. Integumentary (Hair, Skin) Wound #1 status is Open. Original cause of wound was Pressure Injury. The wound is located on the Right,Medial Calcaneus. The wound measures 0.5cm length x 0.7cm width x 0.3cm depth; 0.275cm^2 area and 0.082cm^3 volume. There is Fat Layer (Subcutaneous Tissue) Exposed exposed. There is no tunneling or undermining noted. There is a medium amount of serosanguineous drainage noted. The wound margin is flat and intact. There is large (67-100%) pink, pale granulation within the wound bed. There is a small (1-33%) amount of necrotic tissue within the wound bed including Adherent Slough. The periwound skin appearance exhibited: Hemosiderin Staining, Mottled. The periwound skin appearance did not exhibit: Callus, Crepitus, Excoriation, Induration, Rash, Scarring,  Dry/Scaly, Maceration, Atrophie Blanche, Cyanosis, Ecchymosis, Pallor, Rubor, Erythema. Periwound temperature was noted as No Abnormality. The periwound has tenderness on palpation. Wound #2 status is Open. Original cause of wound was Pressure Injury. The wound is located on the Left,Medial Calcaneus. The wound measures 4.3cm length x 3.5cm width x 0.4cm depth; 11.82cm^2 area and 4.728cm^3 volume. There is Fat Layer (Subcutaneous Tissue) Exposed exposed. There is no tunneling or undermining noted. There is a large amount of serosanguineous drainage noted. The wound margin is flat and intact. There is medium (34-66%) red, pink granulation within the wound bed. There is a small (1-33%) amount of necrotic tissue within the wound bed including Adherent Slough. The periwound skin appearance exhibited: Hemosiderin Staining, Mottled. The periwound skin appearance did not exhibit: Callus, Crepitus, Excoriation, Induration, Rash, Scarring, Dry/Scaly, Maceration, Atrophie Blanche, Cyanosis, Ecchymosis, Pallor, Rubor, Erythema. Periwound temperature was noted as No Abnormality. The periwound has tenderness on palpation. 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 There was a Skin/Subcutaneous Tissue Debridement ([id number]) debridement with total area of 0.35 sq cm performed by STONE III, HOYT E., PA-C. with the following instrument(s): Curette to remove Non-Viable tissue/material including Exudate, Fat Layer (and Subcutaneous Tissue) Exposed, Fibrin/Slough, and Subcutaneous after achieving pain control using Lidocaine 4% Topical Solution. A time out was conducted at 16:34, prior to the start  of the procedure. A Minimum amount of bleeding was controlled with Pressure. The procedure was tolerated well with a pain level of 0 throughout and a pain level of 0 following the procedure. Post Debridement Measurements: 0.5cm length x 0.7cm width x 0.3cm depth; 0.082cm^3 volume. Post debridement Stage noted as Category/Stage III. Character of Wound/Ulcer Post Debridement is stable. Post procedure Diagnosis Wound #1: Same as Pre-Procedure Wound #2 Pre-procedure diagnosis of Wound #2 is a Pressure Ulcer located on the Left,Medial Calcaneus . There was a Skin/Subcutaneous Tissue Debridement ([id number]) debridement  with total area of 15.05 sq cm performed by STONE III, HOYT E., PA-C. with the following instrument(s): Curette to remove Non-Viable tissue/material including Exudate, Fat Layer (and Subcutaneous Tissue) Exposed, Fibrin/Slough, and Subcutaneous after achieving pain control using Lidocaine 4% Topical Solution. A time out was conducted at 16:34, prior to the start of the procedure. A Minimum amount of bleeding was controlled with Pressure. The procedure was tolerated well with a pain level of 0 throughout and a pain level of 0 following the procedure. Post Debridement Measurements: 4.3cm length x 3.5cm width x 0.4cm depth; 4.728cm^3 volume. Post debridement Stage noted as Category/Stage III. Character of Wound/Ulcer Post Debridement is stable. Post procedure Diagnosis Wound #2: Same as Pre-Procedure [name], KEAGY. ([id number]) Plan Wound Cleansing: Wound #1 Right,Medial Calcaneus: Clean wound with Normal Saline. Wound #2 Left,Medial Calcaneus: Clean wound with Normal Saline. Anesthetic: Wound #1 Right,Medial Calcaneus: Topical Lidocaine 4% cream applied to wound bed prior to debridement - In clinic only Wound #2 Left,Medial Calcaneus: Topical Lidocaine 4% cream applied to wound bed prior to debridement - In clinic only Skin Barriers/Peri-Wound Care: Wound #1 Right,Medial  Calcaneus: Skin Prep Wound #2 Left,Medial Calcaneus: Skin Prep Primary Wound Dressing: Wound #1 Right,Medial Calcaneus: Hydrogel Cutimed Sorbact - MEDICATION SENT WITH PATIENT. Wound #2 Left,Medial Calcaneus: Aquacel Ag Secondary Dressing: Wound #2 Left,Medial Calcaneus: ABD and Kerlix/Conform Dressing Change Frequency: Wound #1 Right,Medial Calcaneus: Change dressing every other day. Wound #2 Left,Medial Calcaneus: Change dressing every other day. Follow-up Appointments: Wound #1 Right,Medial Calcaneus: Return Appointment in 2 weeks. Wound #2 Left,Medial Calcaneus: Return Appointment in 2 weeks. Edema Control: Wound #1 Right,Medial Calcaneus: Elevate legs to the level of the heart and pump ankles as often as possible Wound #2 Left,Medial Calcaneus: Elevate legs to the level of the heart and pump ankles as often as possible Off-Loading: Wound #1 Right,Medial Calcaneus: Other: - No pressure on heels!!! Float heel while in chair or bed. Sage boots at night along with floating heels. Wound #2 Left,Medial Calcaneus: Other: - No pressure on heels!!! Float heel while in chair or bed. Sage boots at night along with floating heels. Additional Orders / Instructions: [name], [name] ([id number]) Wound #1 Right,Medial Calcaneus: Increase protein intake. Other: - Vitamins A, C and Zinc Wound #2 Left,Medial Calcaneus: Increase protein intake. Other: - Vitamins A, C and Zinc Home Health: Wound #1 Right,Medial Calcaneus: Continue Home Health Visits - [place] Nurse may visit PRN to address patient s wound care needs. FACE TO FACE ENCOUNTER: MEDICARE and MEDICAID PATIENTS: I certify that this patient is under my care and that I had a face-to-face encounter that meets the physician face-to-face encounter requirements with this patient on this date. The encounter with the patient was in whole or in part for the following MEDICAL CONDITION: (primary reason for [place]) MEDICAL NECESSITY: I certify, that based on my findings, NURSING services are a medically necessary home health service. HOME BOUND STATUS: I certify that my clinical findings support that this patient is homebound (i.e., Due to illness or injury, pt requires aid of supportive devices such as crutches, cane, wheelchairs, walkers, the use of special transportation or the assistance of another person to leave their place of residence. There is a normal inability to leave the home and doing so requires considerable and taxing effort. Other absences are for medical reasons / religious services and are infrequent or of short duration when for other reasons). If current dressing causes regression in wound condition, may D/C ordered  dressing product/s and apply Normal Saline Moist Dressing daily until next [place] / Other MD appointment. [place] of regression in wound condition at [phone]. Please direct any NON-WOUND related issues/requests for orders to patient's Primary Care Physician Wound #2 Left,Medial Calcaneus: [place] Visits - [place] Nurse may visit PRN to address patient s wound care needs. FACE TO FACE ENCOUNTER: MEDICARE and MEDICAID PATIENTS: I certify that this patient is under my care and that I had a face-to-face encounter that meets the physician face-to-face encounter requirements with this patient on this date. The encounter with the patient was in whole or in part for the following MEDICAL CONDITION: (primary reason for [place]) MEDICAL NECESSITY: I certify, that based on my findings, NURSING services are a medically necessary home health service. HOME BOUND STATUS: I certify that my clinical findings support that this patient is homebound (i.e., Due to illness or injury, pt requires aid of supportive devices such as crutches, cane, wheelchairs, walkers, the use of special transportation or the  assistance of another person to leave their place of residence. There is a normal inability to leave the home and doing so requires considerable and taxing effort. Other absences are for medical reasons / religious services and are infrequent or of short duration when for other reasons). If current dressing causes regression in wound condition, may D/C ordered dressing product/s and apply Normal Saline Moist Dressing daily until next [place] / Other MD appointment. [place] of regression in wound condition at [phone]. Please direct any NON-WOUND related issues/requests for orders to patient's Primary Care Physician General Notes: We will continue with the current wound care orders for the next week. If anything worsens interim she will contact our office for additional recommendations. Otherwise for any additional recommendations currently being recommended please refer to above. TORRE, PIKUS ([id number]) Electronic Signature(s) Signed: 04/28/2017 5:22:22 PM By: Worthy Keeler PA-C Entered By: Worthy Keeler on 04/28/2017 16:55:22 Zapien, [name] ([id number]) -------------------------------------------------------------------------------- SuperBill Details Patient Name: [name] Date of Service: 04/28/2017 Medical Record Number: [id number] Patient Account Number: [id number] Date of Birth/Sex: 22-Jul-1931 (83 y.o. 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.322 Pressure ulcer of left buttock, stage 2 Facility Procedures CPT4 Code: [id number] Description: 58099 - DEB SUBQ TISSUE 20 SQ CM/< ICD-10 Description  Diagnosis L89.623 Pressure ulcer of left heel, stage 3 L89.613 Pressure ulcer of right heel, stage 3 Modifier: Quantity: 1 Physician Procedures CPT4 Code: 8338250 Description: 11042 - WC PHYS SUBQ TISS 20 SQ CM ICD-10 Description Diagnosis L89.623 Pressure ulcer of left heel, stage 3 L89.613 Pressure ulcer of right heel, stage 3 Modifier: Quantity: 1 Electronic Signature(s) Signed: 04/28/2017 5:22:22 PM By: Worthy Keeler PA-C Entered By: Worthy Keeler on 04/28/2017 16:55:42

## 2017-05-04 NOTE — Telephone Encounter (Signed)
Spoke to patient and informed her that we still do not have Myrbetriq 25mg  samples. I told her it may be better to fill the RX that was given and offered to find out if there is anything else she may want to try. I could ask the doctor. She stated she will continue to wait and try again later.

## 2017-05-05 ENCOUNTER — Ambulatory Visit (INDEPENDENT_AMBULATORY_CARE_PROVIDER_SITE_OTHER): Payer: Medicare HMO

## 2017-05-05 DIAGNOSIS — R339 Retention of urine, unspecified: Secondary | ICD-10-CM | POA: Diagnosis not present

## 2017-05-05 NOTE — Progress Notes (Signed)
Suprapubic Cath Change  Patient is present today for a suprapubic catheter change due to urinary retention.  6ml of water was drained from the balloon, a 16FR foley cath was removed from the tract with out difficulty.  Site was cleaned and prepped in a sterile fashion with betadine.  A 16FR foley cath was replaced into the tract no complications were noted. Urine return was noted, 10 ml of sterile water was inflated into the balloon and a night bag was attached for drainage.  Patient tolerated well.  Preformed by: C. Corinna Capra, CMA and Fonnie Jarvis, CMA  Follow up: 1 month

## 2017-05-06 ENCOUNTER — Encounter: Payer: Medicare HMO | Admitting: Surgery

## 2017-05-06 DIAGNOSIS — L89623 Pressure ulcer of left heel, stage 3: Secondary | ICD-10-CM | POA: Diagnosis not present

## 2017-05-08 NOTE — Progress Notes (Signed)
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 No crepitus or fluctuance. No peri-wound warmth or erythema. No masses.. Wound #1 status is Open. Original cause of wound was Pressure Injury. The wound is located on the Right,Medial Calcaneus. The wound measures 0.5cm length x 0.5cm width x 0.2cm depth; 0.196cm^2 area Lampson, Danielle S. ([id number]) and 0.039cm^3 volume. There is Fat Layer (Subcutaneous Tissue) Exposed exposed. There is no tunneling or undermining noted. There is a medium amount of serosanguineous drainage noted. The wound margin is flat and intact. There is large (67-100%) pink, pale granulation within the wound bed. There is a small (1-33%) amount of necrotic tissue within the wound bed including Adherent Slough. The periwound skin appearance exhibited: Hemosiderin Staining, Mottled. The periwound skin appearance did not exhibit: Callus, Crepitus, Excoriation, Induration, Rash, Scarring, Dry/Scaly, Maceration, Atrophie Blanche, Cyanosis, Ecchymosis, Pallor, Rubor, Erythema. Periwound temperature was noted as No Abnormality. The periwound has tenderness on palpation. Wound #2 status is Open. Original cause of wound was  Pressure Injury. The wound is located on the Left,Medial Calcaneus. The wound measures 4.3cm length x 3.5cm width x 0.3cm depth; 11.82cm^2 area and 3.546cm^3 volume. There is Fat Layer (Subcutaneous Tissue) Exposed exposed. There is no tunneling or undermining noted. There is a large amount of serosanguineous drainage noted. The wound margin is flat and intact. There is medium (34-66%) red, pink granulation within the wound bed. There is a small (1-33%) amount of necrotic tissue within the wound bed including Adherent Slough. The periwound skin appearance exhibited: Hemosiderin Staining, Mottled. The periwound skin appearance did not exhibit: Callus, Crepitus, Excoriation, Induration, Rash, Scarring, Dry/Scaly, Maceration, Atrophie Blanche, Cyanosis, Ecchymosis, Pallor, Rubor, Erythema. Periwound temperature was noted as No Abnormality. The periwound has tenderness on palpation. 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 There was a Skin/Subcutaneous Tissue Debridement ([id number]) debridement with total area of 15.05 sq cm performed by Christin Fudge, MD. with the following instrument(s): Forceps and Scissors to remove Non-Viable tissue/material including Exudate, Fat Layer (and Subcutaneous Tissue) Exposed, Fibrin/Slough, and Subcutaneous after achieving pain control using Lidocaine 4% Topical Solution. A time out was conducted at 14:33, prior to the start of the procedure. A Moderate amount of bleeding was controlled with Silver Nitrate. The procedure was tolerated well with a pain level of 2 throughout and a pain level of 2 following the procedure. Post Debridement Measurements: 4.3cm length x 3.5cm width x  0.3cm depth; 3.546cm^3 volume. Post debridement Stage noted as Category/Stage III. [name], [name] ([id number]) Character of Wound/Ulcer Post Debridement requires further debridement. Post procedure Diagnosis Wound #2: Same as Pre-Procedure Plan Wound Cleansing: Wound #1 Right,Medial Calcaneus: Clean wound with Normal Saline. Wound #2 Left,Medial Calcaneus: Clean wound with Normal Saline. Anesthetic: Wound #1 Right,Medial Calcaneus: Topical Lidocaine 4% cream applied to wound bed prior to debridement - In clinic only  Wound #2 Left,Medial Calcaneus: Topical Lidocaine 4% cream applied to wound bed prior to debridement - In clinic only Skin Barriers/Peri-Wound Care: Wound #1 Right,Medial Calcaneus: Skin Prep Wound #2 Left,Medial Calcaneus: Skin Prep Primary Wound Dressing: Wound #1 Right,Medial Calcaneus: Hydrogel Cutimed Sorbact - MEDICATION SENT WITH PATIENT. Wound #2 Left,Medial Calcaneus: Aquacel Ag Secondary Dressing: Wound #2 Left,Medial Calcaneus: ABD and Kerlix/Conform Dressing Change Frequency: Wound #1 Right,Medial Calcaneus: Change dressing every other day. Wound #2 Left,Medial Calcaneus: Change dressing every other day. Follow-up Appointments: Wound #1 Right,Medial Calcaneus: Return Appointment in 2 weeks. Wound #2 Left,Medial Calcaneus: Return Appointment in 2 weeks. Edema Control: Wound #1 Right,Medial Calcaneus: Elevate legs to the level of the heart and pump ankles as often as possible Wound #2 Left,Medial Calcaneus: Elevate legs to the level of the heart and pump ankles as often as possible Off-Loading: [name], [name] ([id number]) Wound #1 Right,Medial Calcaneus: Other: - No pressure on heels!!! Float heel while in chair or bed. Sage boots at night along with floating heels. Wound #2 Left,Medial Calcaneus: Other: - No pressure on heels!!! Float heel while in chair or bed. Sage boots at night along with floating heels. Additional Orders /  Instructions: Wound #1 Right,Medial Calcaneus: Increase protein intake. Other: - Vitamins A, C and Zinc Wound #2 Left,Medial Calcaneus: Increase protein intake. Other: - Vitamins A, C and Zinc Home Health: Wound #1 Right,Medial Calcaneus: Continue Home Health Visits - [place] Nurse may visit PRN to address patient s wound care needs. FACE TO FACE ENCOUNTER: MEDICARE and MEDICAID PATIENTS: I certify that this patient is under my care and that I had a face-to-face encounter that meets the physician face-to-face encounter requirements with this patient on this date. The encounter with the patient was in whole or in part for the following MEDICAL CONDITION: (primary reason for [place]) MEDICAL NECESSITY: I certify, that based on my findings, NURSING services are a medically necessary home health service. HOME BOUND STATUS: I certify that my clinical findings support that this patient is homebound (i.e., Due to illness or injury, pt requires aid of supportive devices such as crutches, cane, wheelchairs, walkers, the use of special transportation or the assistance of another person to leave their place of residence. There is a normal inability to leave the home and doing so requires considerable and taxing effort. Other absences are for medical reasons / religious services and are infrequent or of short duration when for other reasons). If current dressing causes regression in wound condition, may D/C ordered dressing product/s and apply Normal Saline Moist Dressing daily until next [place] / Other MD appointment. [place] of regression in wound condition at [phone]. Please direct any NON-WOUND related issues/requests for orders to patient's Primary Care Physician Wound #2 Left,Medial Calcaneus: [place] Visits - [place] Nurse may visit PRN to address patient s wound care needs. FACE TO FACE ENCOUNTER: MEDICARE  and MEDICAID PATIENTS: I certify that this patient is under my care and that I had a face-to-face encounter that meets the physician face-to-face encounter requirements with this patient on this date. The encounter with the patient was in whole or in part for the following MEDICAL CONDITION: (primary reason for [place]) MEDICAL NECESSITY: I certify, that based on my findings, NURSING services are a medically necessary home health service. HOME BOUND STATUS: I certify that my clinical findings support that this patient is homebound (i.e., Due to illness or injury, pt requires aid of supportive  devices such as crutches, cane, wheelchairs, walkers, the use of special transportation or the assistance of another person to leave their place of residence. There is a normal inability to leave the home and doing so requires considerable and taxing effort. Other absences are for medical reasons / religious services and are infrequent or of short duration when for other reasons). If current dressing causes regression in wound condition, may D/C ordered dressing product/s and apply Normal Saline Moist Dressing daily until next [place] / Other MD appointment. [place] of regression in wound condition at [phone]. Please direct any NON-WOUND related issues/requests for orders to patient's Primary Care Physician [name], [name] ([id number]) after review today I have recommended: 1. Sorbact with hydrogel to be applied to the right heel wound and this can be changed every other day with an appropriate offloading technique. 2. for the left medial heel sharp dissection was done and we'll continue with silver alginate, on alternate days with an offloading technique 3. Continue with adequate protein, vitamin A, vitamin C and zinc 4. Regular visits to the wound center Electronic Signature(s) Signed: 05/06/2017 2:59:48 PM By: Christin Fudge MD, FACS Entered By: Christin Fudge  on 05/06/2017 14:59:48 [name] ([id number]) -------------------------------------------------------------------------------- SuperBill Details Patient Name: [name] Date of Service: 05/06/2017 Medical Record Number: [id number] Patient Account Number: [id number] Date of Birth/Sex: December 12, 1930 (83 y.o. 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 L89.322 Pressure ulcer of left buttock, stage 2 Facility Procedures CPT4 Code: [id number] Description: 54270 - DEB SUBQ TISSUE 20 SQ CM/< ICD-10 Description Diagnosis L89.623 Pressure ulcer of left heel, stage 3 L89.613 Pressure ulcer of right heel, stage 3 Z99.3 Dependence on wheelchair Modifier: Quantity: 1 Physician Procedures CPT4 Code: [id number] Description: 11042 - WC PHYS SUBQ TISS 20 SQ CM ICD-10 Description Diagnosis L89.623 Pressure ulcer of left heel, stage 3 L89.613 Pressure ulcer of right heel, stage 3 Z99.3 Dependence on wheelchair Modifier: Quantity: 1 Electronic Signature(s) Signed: 05/06/2017 3:03:47 PM By: Christin Fudge MD, FACS Entered By: Christin Fudge on 05/06/2017 15:03:46

## 2017-05-09 NOTE — Progress Notes (Addendum)
[name], [name] ([id number]) Visit Report for 05/06/2017 Arrival Information Details Patient Name: [name], [name]. Date of Service: 05/06/2017 2:00 PM Medical Record Number: [id number] Patient Account Number: [id number] Date of Birth/Sex: 1931-07-08 (83 y.o. Female) Treating RN: Afful, RN, BSN, Velva Harman Primary Care Argenis Kumari: [name] Other Clinician: Referring Lorette Peterkin: [name] Treating Sakura Denis/Extender: Frann Rider in Treatment: 31 Visit Information History Since Last Visit All ordered tests and consults were completed: No Patient Arrived: Wheel Chair Added or deleted any medications: No Arrival Time: 13:51 Any new allergies or adverse reactions: No Accompanied By: self Had a fall or experienced change in No activities of daily living that may affect Transfer Assistance: Harrel Lemon Lift risk of falls: Patient Identification Verified: Yes Signs or symptoms of abuse/neglect since last No Secondary Verification Process Yes visito Completed: Hospitalized since last visit: No Patient Requires Transmission-Based No Has Dressing in Place as Prescribed: Yes Precautions: Pain Present Now: No Patient Has Alerts: No Electronic Signature(s) Signed: 05/06/2017 1:51:56 PM By: Regan Lemming BSN, RN Entered By: Regan Lemming on 05/06/2017 13:51:56 [name] ([id number]) -------------------------------------------------------------------------------- Encounter Discharge Information Details Patient Name: [name]. Date of Service: 05/06/2017 2:00 PM Medical Record Number: [id number] Patient Account Number: [id number] Date of Birth/Sex: 1931/01/14 (83 y.o. Female) Treating RN: Afful, RN, BSN, Velva Harman Primary Care Colston Pyle: [name] Other Clinician: Referring Bernie Ransford: [name] Treating Leonel Mccollum/Extender: Frann Rider in Treatment: 65 Encounter Discharge Information Items Discharge Pain Level: 0 Discharge Condition: Stable Ambulatory Status:  Wheelchair Discharge Destination: Home Transportation: Other Schedule Follow-up Appointment: No Medication Reconciliation completed and provided to Patient/Care No Lun Muro: Provided on Clinical Summary of Care: 05/06/2017 Form Type Recipient Paper Patient EB Electronic Signature(s) Signed: 05/06/2017 3:49:49 PM By: Regan Lemming BSN, RN Previous Signature: 05/06/2017 2:56:15 PM Version By: Ruthine Dose Entered By: Regan Lemming on 05/06/2017 15:49:48 [name] ([id number]) -------------------------------------------------------------------------------- Lower Extremity Assessment Details Patient Name: [name]. Date of Service: 05/06/2017 2:00 PM Medical Record Number: [id number] Patient Account Number: [id number] Date of Birth/Sex: Jul 10, 1931 (83 y.o. Female) Treating RN: Afful, RN, BSN, Velva Harman Primary Care Meelah Tallo: [name] Other Clinician: Referring Sathvik Tiedt: [name] Treating Cataleah Stites/Extender: Frann Rider in Treatment: 34 Vascular Assessment Pulses: Dorsalis Pedis Palpable: [Left:Yes] [Right:Yes] Posterior Tibial Extremity colors, hair growth, and conditions: Extremity Color: [Left:Normal] [Right:Normal] Hair Growth on Extremity: [Left:No] [Right:No] Temperature of Extremity: [Left:Warm] [Right:Warm] Capillary Refill: [Left:< 3 seconds] [Right:< 3 seconds] Toe Nail Assessment Left: Right: Thick: Yes Yes Discolored: Yes Yes Deformed: Yes Yes Improper Length and Hygiene: Yes Yes Electronic Signature(s) Signed: 05/06/2017 3:47:55 PM By: Regan Lemming BSN, RN Entered By: Regan Lemming on 05/06/2017 14:01:39 [name] ([id number]) -------------------------------------------------------------------------------- Multi Wound Chart Details Patient Name: [name]. Date of Service: 05/06/2017 2:00 PM Medical Record Number: [id number] Patient Account Number: [id number] Date of Birth/Sex: 1931/06/08 (83 y.o. Female) Treating RN: Afful, RN,  BSN, Velva Harman Primary Care Tranise Forrest: [name] Other Clinician: Referring Sylvia Helms: [name] Treating Claudene Gatliff/Extender: Frann Rider in Treatment: 34 Photos: [1:No Photos] [2:No Photos] [N/A:N/A] Wound Location: [1:Right Calcaneus - Medial] [2:Left Calcaneus - Medial] [N/A:N/A] Wounding Event: [1:Pressure Injury] [2:Pressure Injury] [N/A:N/A] Primary Etiology: [1:Pressure Ulcer] [2:Pressure Ulcer] [N/A:N/A] Comorbid History: [1:Cataracts, Hypertension] [2:Cataracts, Hypertension] [N/A:N/A] Date Acquired: [1:07/02/2016] [2:07/02/2016] [N/A:N/A] Weeks of Treatment: [1:34] [2:34] [N/A:N/A] Wound Status: [1:Open] [2:Open] [N/A:N/A] Measurements L x W x D 0.5x0.5x0.2 [2:4.3x3.5x0.3] [N/A:N/A] (cm) Area (cm) : [1:0.196] [2:11.82] [N/A:N/A] Volume (cm) : [1:0.039] [2:3.546] [N/A:N/A] % Reduction in Area: [1:98.40%] [2:40.30%] [N/A:N/A] % Reduction in Volume:  96.80% [2:-79.20%] [N/A:N/A] Classification: [1:Category/Stage III] [2:Category/Stage III] [N/A:N/A] Exudate Amount: [1:Medium] [2:Large] [N/A:N/A] Exudate Type: [1:Serosanguineous] [2:Serosanguineous] [N/A:N/A] Exudate Color: [1:red, brown] [2:red, brown] [N/A:N/A] Wound Margin: [1:Flat and Intact] [2:Flat and Intact] [N/A:N/A] Granulation Amount: [1:Large (67-100%)] [2:Medium (34-66%)] [N/A:N/A] Granulation Quality: [1:Pink, Pale] [2:Red, Pink] [N/A:N/A] Necrotic Amount: [1:Small (1-33%)] [2:Small (1-33%)] [N/A:N/A] Exposed Structures: [1:Fat Layer (Subcutaneous Tissue) Exposed: Yes Fascia: No Tendon: No Muscle: No Joint: No Bone: No] [2:Fat Layer (Subcutaneous Tissue) Exposed: Yes Fascia: No Tendon: No Muscle: No Joint: No Bone: No] [N/A:N/A] Epithelialization: [1:Large (67-100%)] [2:None] [N/A:N/A] Debridement: [1:N/A] [2:Debridement ([id number])] [N/A:N/A] Pre-procedure [1:N/A] [2:14:33] [N/A:N/A] Verification/Time Out Taken: Pain Control: [1:N/A] [2:Lidocaine 4% Topical Solution] [N/A:N/A] Tissue  Debrided: [1:N/A] [N/A:N/A] Fibrin/Slough, Fat, Exudates, Subcutaneous Level: N/A Skin/Subcutaneous N/A Tissue Debridement Area (sq N/A 15.05 N/A cm): Instrument: N/A Forceps, Scissors N/A Bleeding: N/A Moderate N/A Hemostasis Achieved: N/A Silver Nitrate N/A Procedural Pain: N/A 2 N/A Post Procedural Pain: N/A 2 N/A Debridement Treatment N/A Procedure was tolerated N/A Response: well Post Debridement N/A 4.3x3.5x0.3 N/A Measurements L x W x D (cm) Post Debridement N/A 3.546 N/A Volume: (cm) Post Debridement N/A Category/Stage III N/A Stage: Periwound Skin Texture: Excoriation: No Excoriation: No N/A Induration: No Induration: No Callus: No Callus: No Crepitus: No Crepitus: No Rash: No Rash: No Scarring: No Scarring: No Periwound Skin Maceration: No Maceration: No N/A Moisture: Dry/Scaly: No Dry/Scaly: No Periwound Skin Color: Hemosiderin Staining: Yes Hemosiderin Staining: Yes N/A Mottled: Yes Mottled: Yes Atrophie Blanche: No Atrophie Blanche: No Cyanosis: No Cyanosis: No Ecchymosis: No Ecchymosis: No Erythema: No Erythema: No Pallor: No Pallor: No Rubor: No Rubor: No Temperature: No Abnormality No Abnormality N/A Tenderness on Yes Yes N/A Palpation: Wound Preparation: Ulcer Cleansing: Ulcer Cleansing: N/A Rinsed/Irrigated with Rinsed/Irrigated with Saline Saline Topical Anesthetic Topical Anesthetic Applied: Other: lidocaine Applied: Other: lidocaine 4% 4% Procedures Performed: N/A Debridement N/A Treatment Notes ROGELIO, WINBUSH ([id number]) Electronic Signature(s) Signed: 05/06/2017 2:58:04 PM By: Christin Fudge MD, FACS Entered By: Christin Fudge on 05/06/2017 14:58:03 [name] ([id number]) -------------------------------------------------------------------------------- [place] Details Patient Name: [name] Date of Service: 05/06/2017 2:00 PM Medical Record Number: [id number] Patient Account Number:  [id number] Date of Birth/Sex: 1931-06-26 (83 y.o. Female) Treating RN: Afful, RN, BSN, Velva Harman Primary Care Gurneet Matarese: [name] Other Clinician: Referring Khrystyne Arpin: [name] Treating Nandika Stetzer/Extender: Frann Rider in Treatment: 39 Active Inactive ` Abuse / Safety / Falls / Self Care Management Nursing Diagnoses: Impaired physical mobility Potential for falls Goals: Patient will remain injury free Date Initiated: 09/06/2016 Target Resolution Date: 12/02/2016 Goal Status: Active Interventions: Assess fall risk on admission and as needed Assess self care needs on admission and as needed Notes: ` Necrotic Tissue Nursing Diagnoses: Impaired tissue integrity related to necrotic/devitalized tissue Goals: Necrotic/devitalized tissue will be minimized in the wound bed Date Initiated: 09/06/2016 Target Resolution Date: 12/02/2016 Goal Status: Active Interventions: Assess patient pain level pre-, during and post procedure and prior to discharge Notes: ` Orientation to the Wound Care Program Nursing Diagnoses: BERDA, SHELVIN ([id number]) Knowledge deficit related to the wound healing center program Goals: Patient/caregiver will verbalize understanding of the [facility] Program Date Initiated: 09/06/2016 Target Resolution Date: 12/02/2016 Goal Status: Active Interventions: Provide education on orientation to the wound center Notes: ` Pressure Nursing Diagnoses: Knowledge deficit related to management of pressures ulcers Potential for impaired tissue integrity related to pressure, friction, moisture, and shear Goals: Patient will remain free of pressure ulcers Date Initiated: 09/06/2016 Target Resolution Date: 12/02/2016  Goal Status: Active Interventions: Assess: immobility, friction, shearing, incontinence upon admission and as needed Notes: ` Soft Tissue Infection Nursing Diagnoses: Impaired tissue integrity Potential for infection: soft  tissue Goals: Patient will remain free of wound infection Date Initiated: 09/06/2016 Target Resolution Date: 12/02/2016 Goal Status: Active Interventions: Assess signs and symptoms of infection every visit Notes: ` Wound/Skin Impairment MICIAH, SHEALY ([id number]) Nursing Diagnoses: Impaired tissue integrity Goals: Ulcer/skin breakdown will heal within 14 weeks Date Initiated: 09/06/2016 Target Resolution Date: 12/16/2016 Goal Status: Active Interventions: Assess ulceration(s) every visit Notes: Electronic Signature(s) Signed: 05/06/2017 3:47:55 PM By: Regan Lemming BSN, RN Entered By: Regan Lemming on 05/06/2017 14:34:08 [name] ([id number]) -------------------------------------------------------------------------------- Pain Assessment Details Patient Name: [name]. Date of Service: 05/06/2017 2:00 PM Medical Record Number: [id number] Patient Account Number: [id number] Date of Birth/Sex: [birth year] (83 y.o. Female) Treating RN: Afful, RN, BSN, Velva Harman Primary Care Staceyann Knouff: [name] Other Clinician: Referring Elliet Goodnow: [name] Treating Brok Stocking/Extender: Frann Rider in Treatment: 34 Active Problems Location of Pain Severity and Description of Pain Patient Has Paino No Site Locations With Dressing Change: No Pain Management and Medication Current Pain Management: Electronic Signature(s) Signed: 05/06/2017 1:52:05 PM By: Regan Lemming BSN, RN Entered By: Regan Lemming on 05/06/2017 13:52:04 [name] ([id number]) -------------------------------------------------------------------------------- Patient/Caregiver Education Details Patient Name: [name]. Date of Service: 05/06/2017 2:00 PM Medical Record Number: [id number] Patient Account Number: [id number] Date of Birth/Gender: [birth year] (83 y.o. Female) Treating RN: Afful, RN, BSN, Velva Harman Primary Care Physician: [name] Other Clinician: Referring Physician: [name] Treating Physician/Extender: Frann Rider in Treatment: 54 Education Assessment Education Provided To: Patient and Caregiver hh Education Topics Provided Welcome To [facility]: Electronic Signature(s) Signed: 05/06/2017 4:04:15 PM By: Regan Lemming BSN, RN Entered By: Regan Lemming on 05/06/2017 15:50:01 [name] ([id number]) -------------------------------------------------------------------------------- Wound Assessment Details Patient Name: [name]. Date of Service: 05/06/2017 2:00 PM Medical Record Number: [id number] Patient Account Number: [id number] Date of Birth/Sex: May 19, 1931 (83 y.o. Female) Treating RN: Afful, RN, BSN, [place] Primary Care Ezrael Sam: [name] Other Clinician: Referring Abran Gavigan: [name] Treating Shantell Belongia/Extender: Frann Rider in Treatment: 34 Wound Status Wound Number: 1 Primary Etiology: Pressure Ulcer Wound Location: Right Calcaneus - Medial Wound Status: Open Wounding Event: Pressure Injury Comorbid History: Cataracts, Hypertension Date Acquired: 07/02/2016 Weeks Of Treatment: 34 Clustered Wound: No Photos Photo Uploaded By: Regan Lemming on 05/06/2017 15:52:36 Wound Measurements Length: (cm) 0.5 Width: (cm) 0.5 Depth: (cm) 0.2 Area: (cm) 0.196 Volume: (cm) 0.039 % Reduction in Area: 98.4% % Reduction in Volume: 96.8% Epithelialization: Large (67-100%) Tunneling: No Undermining: No Wound Description Classification: Category/Stage III Foul Odor Aft Wound Margin: Flat and Intact Slough/Fibrin Exudate Amount: Medium Exudate Type: Serosanguineous Exudate Color: red, brown PALIN, TRISTAN ([id number]) er Cleansing: No o Yes Wound Bed Granulation Amount: Large (67-100%) Exposed Structure Granulation Quality: Pink, Pale Fascia Exposed: No Necrotic Amount: Small (1-33%) Fat Layer (Subcutaneous Tissue) Exposed: Yes Necrotic Quality: Adherent Slough Tendon Exposed: No Muscle  Exposed: No Joint Exposed: No Bone Exposed: No Periwound Skin Texture Texture Color No Abnormalities Noted: No No Abnormalities Noted: No Callus: No Atrophie Blanche: No Crepitus: No Cyanosis: No Excoriation: No Ecchymosis: No Induration: No Erythema: No Rash: No Hemosiderin Staining: Yes Scarring: No Mottled: Yes Pallor: No Moisture Rubor: No No Abnormalities Noted: No Dry / Scaly: No Temperature / Pain Maceration: No Temperature: No Abnormality Tenderness on Palpation: Yes Wound Preparation Ulcer Cleansing: Rinsed/Irrigated with Saline Topical Anesthetic Applied: Other:  lidocaine 4%, Treatment Notes Wound #1 (Right, Medial Calcaneus) 1. Cleansed with: Clean wound with Normal Saline 4. Dressing Applied: Aquacel Ag Hydrogel Other dressing (specify in notes) 5. Secondary Dressing Applied Guaze, ABD and kerlix/Conform 7. Secured with Tape Notes SORBACT AND HYDROGEL TO RIGHT HEEL Electronic Signature(s) Signed: 05/06/2017 3:47:55 PM By: Regan Lemming BSN, RN [place], [name] ([id number]) Entered By: Regan Lemming on 05/06/2017 14:08:59 [name] ([id number]) -------------------------------------------------------------------------------- Wound Assessment Details Patient Name: [name]. Date of Service: 05/06/2017 2:00 PM Medical Record Number: [id number] Patient Account Number: [id number] Date of Birth/Sex: Jul 19, 1931 (83 y.o. Female) Treating RN: Afful, RN, BSN, [place] Primary Care Isay Perleberg: [name] Other Clinician: Referring Mekhi Sonn: [name] Treating Daemyn Gariepy/Extender: Frann Rider in Treatment: 34 Wound Status Wound Number: 2 Primary Etiology: Pressure Ulcer Wound Location: Left Calcaneus - Medial Wound Status: Open Wounding Event: Pressure Injury Comorbid History: Cataracts, Hypertension Date Acquired: 07/02/2016 Weeks Of Treatment: 34 Clustered Wound: No Photos Photo Uploaded By: Regan Lemming on 05/06/2017  15:52:37 Wound Measurements Length: (cm) 4.3 % Reduction in Width: (cm) 3.5 % Reduction in Depth: (cm) 0.3 Epithelializat Area: (cm) 11.82 Tunneling: Volume: (cm) 3.546 Undermining: Area: 40.3% Volume: -79.2% ion: None No No Wound Description Classification: Category/Stage III Foul Odor Aft Wound Margin: Flat and Intact Slough/Fibrin Exudate Amount: Large Exudate Type: Serosanguineous Exudate Color: red, brown EVITA, MERIDA ([id number]) er Cleansing: No o Yes Wound Bed Granulation Amount: Medium (34-66%) Exposed Structure Granulation Quality: Red, Pink Fascia Exposed: No Necrotic Amount: Small (1-33%) Fat Layer (Subcutaneous Tissue) Exposed: Yes Necrotic Quality: Adherent Slough Tendon Exposed: No Muscle Exposed: No Joint Exposed: No Bone Exposed: No Periwound Skin Texture Texture Color No Abnormalities Noted: No No Abnormalities Noted: No Callus: No Atrophie Blanche: No Crepitus: No Cyanosis: No Excoriation: No Ecchymosis: No Induration: No Erythema: No Rash: No Hemosiderin Staining: Yes Scarring: No Mottled: Yes Pallor: No Moisture Rubor: No No Abnormalities Noted: No Dry / Scaly: No Temperature / Pain Maceration: No Temperature: No Abnormality Tenderness on Palpation: Yes Wound Preparation Ulcer Cleansing: Rinsed/Irrigated with Saline Topical Anesthetic Applied: Other: lidocaine 4%, Treatment Notes Wound #2 (Left, Medial Calcaneus) 1. Cleansed with: Clean wound with Normal Saline 4. Dressing Applied: Aquacel Ag Hydrogel Other dressing (specify in notes) 5. Secondary Dressing Applied Guaze, ABD and kerlix/Conform 7. Secured with Tape Notes SORBACT AND HYDROGEL TO RIGHT HEEL Electronic Signature(s) Signed: 05/06/2017 3:47:55 PM By: Regan Lemming BSN, RN [place], [name] ([id number]) Entered By: Regan Lemming on 05/06/2017 14:33:47 [name]  ([id number]) -------------------------------------------------------------------------------- Vitals Details Patient Name: [name] Date of Service: 05/06/2017 2:00 PM Medical Record Number: [id number] Patient Account Number: [id number] Date of Birth/Sex: Dec 25, 1930 (83 y.o. Female) Treating RN: Afful, RN, BSN, Velva Harman Primary Care Josephus Harriger: [name] Other Clinician: Referring Cartrell Bentsen: [name] Treating Yalda Herd/Extender: Frann Rider in Treatment: 34 Vital Signs Time Taken: 13:52 Reference Range: 80 - 120 mg / dl Height (in): 63 Weight (lbs): 160 Body Mass Index (BMI): 28.3 Electronic Signature(s) Signed: 05/06/2017 1:52:54 PM By: Regan Lemming BSN, RN Entered By: Regan Lemming on 05/06/2017 13:52:54

## 2017-05-12 ENCOUNTER — Encounter: Payer: Medicare HMO | Admitting: Surgery

## 2017-05-12 DIAGNOSIS — L89623 Pressure ulcer of left heel, stage 3: Secondary | ICD-10-CM | POA: Diagnosis not present

## 2017-05-14 NOTE — Progress Notes (Signed)
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 Induration, Rash, Scarring, Dry/Scaly, Maceration, Atrophie Blanche, Cyanosis, Ecchymosis, Pallor, Rubor, Erythema. Periwound temperature was noted as No Abnormality. The periwound has tenderness on palpation. 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 Wound #2 Left,Medial Calcaneus: Clean wound with Normal Saline. Anesthetic: Wound #1 Right,Medial Calcaneus: Topical Lidocaine 4% cream applied to wound bed prior to debridement - In clinic only Wound #2 Left,Medial Calcaneus: Topical Lidocaine 4% cream applied to wound bed prior to debridement - In clinic only [name], [name]. ([id number]) Skin Barriers/Peri-Wound Care: Wound #1 Right,Medial Calcaneus: Skin Prep Wound #2 Left,Medial Calcaneus: Skin Prep Primary Wound Dressing: Wound #1 Right,Medial Calcaneus: Hydrogel Cutimed Sorbact - MEDICATION SENT WITH PATIENT. Wound #2 Left,Medial Calcaneus: Aquacel Ag Secondary Dressing: Wound #2 Left,Medial Calcaneus: ABD and  [place]/Conform Dressing Change Frequency: Wound #1 Right,Medial Calcaneus: Change dressing every other day. Wound #2 Left,Medial Calcaneus: Change dressing every other day. Follow-up Appointments: Wound #1 Right,Medial Calcaneus: Return Appointment in 2 weeks. Wound #2 Left,Medial Calcaneus: Return Appointment in 2 weeks. Edema Control: Wound #1 Right,Medial Calcaneus: Elevate legs to the level of the heart and pump ankles as often as possible Wound #2 Left,Medial Calcaneus: Elevate legs to the level of the heart and pump ankles as often as possible Off-Loading: Wound #1 Right,Medial Calcaneus: Other: - No pressure on heels!!! Float heel while in chair or bed. Sage boots at night along with floating heels. Wound #2 Left,Medial Calcaneus: Other: - No pressure on heels!!! Float heel while in chair or bed. Sage boots at night along with floating heels. Additional Orders / Instructions: Wound #1 Right,Medial Calcaneus: Increase protein intake. Other: - Vitamins A, C and Zinc Wound #2 Left,Medial Calcaneus: Increase protein intake. Other: - Vitamins A, C and Zinc Home Health: Wound #1 Right,Medial Calcaneus: Continue Home Health Visits - [place] Nurse may visit PRN to address patient s wound care needs. FACE TO FACE ENCOUNTER: MEDICARE and MEDICAID PATIENTS: I certify that this patient is under my care and that I had a face-to-face encounter that meets the physician face-to-face encounter requirements with this patient on this date. The encounter with the patient was in whole or in part for the following MEDICAL CONDITION: (primary reason for [place]) MEDICAL NECESSITY: I certify, [name], [name] ([id number]) that based on my findings, NURSING services are a medically necessary home health service. HOME BOUND STATUS: I certify that my clinical findings support that this patient is homebound (i.e., Due to illness or injury, pt requires aid of supportive devices  such as crutches, cane, wheelchairs, walkers, the use of special transportation or the assistance of another person to leave their place of residence. There is a normal inability to leave the home and doing so  requires considerable and taxing effort. Other absences are for medical reasons / religious services and are infrequent or of short duration when for other reasons). If current dressing causes regression in wound condition, may D/C ordered dressing product/s and apply Normal Saline Moist Dressing daily until next [place] / Other MD appointment. [place] of regression in wound condition at [phone]. Please direct any NON-WOUND related issues/requests for orders to patient's Primary Care Physician Wound #2 Left,Medial Calcaneus: [place] Visits - [place] Nurse may visit PRN to address patient s wound care needs. FACE TO FACE ENCOUNTER: MEDICARE and MEDICAID PATIENTS: I certify that this patient is under my care and that I had a face-to-face encounter that meets the physician face-to-face encounter requirements with this patient on this date. The encounter with the patient was in whole or in part for the following MEDICAL CONDITION: (primary reason for [place]) MEDICAL NECESSITY: I certify, that based on my findings, NURSING services are a medically necessary home health service. HOME BOUND STATUS: I certify that my clinical findings support that this patient is homebound (i.e., Due to illness or injury, pt requires aid of supportive devices such as crutches, cane, wheelchairs, walkers, the use of special transportation or the assistance of another person to leave their place of residence. There is a normal inability to leave the home and doing so requires considerable and taxing effort. Other absences are for medical reasons / religious services and are infrequent or of short duration when for other reasons). If current  dressing causes regression in wound condition, may D/C ordered dressing product/s and apply Normal Saline Moist Dressing daily until next [place] / Other MD appointment. [place] of regression in wound condition at [phone]. Please direct any NON-WOUND related issues/requests for orders to patient's Primary Care Physician after review today I have recommended: 1. Sorbact with hydrogel to be applied to the right heel wound and this can be changed every other day with an appropriate offloading technique. 2. for the left medial heel we'll continue with silver alginate, on alternate days with an offloading technique 3. Continue with adequate protein, vitamin A, vitamin C and zinc 4. Regular visits to the wound center Electronic Signature(s) Signed: 05/12/2017 2:38:12 PM By: Christin Fudge MD, FACS Entered By: Christin Fudge on 05/12/2017 14:38:11 [name] ([id number]) -------------------------------------------------------------------------------- SuperBill Details Patient Name: [name] Date of Service: 05/12/2017 Medical Record Number: [id number] Patient Account Number: [id number] Date of Birth/Sex: [birth year] (83 y.o. 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 L89.322 Pressure ulcer of left buttock, stage 2 Facility Procedures CPT4 Code: [id number] Description: 99214 - WOUND CARE VISIT-LEV 4 EST PT Modifier: Quantity: 1 Physician Procedures CPT4 Code: [id number] Description: 39767 - WC PHYS LEVEL 3 - EST PT ICD-10 Description Diagnosis L89.623 Pressure ulcer of left heel, stage 3 L89.613 Pressure ulcer of right heel,  stage 3 Z99.3 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

## 2017-05-16 NOTE — Progress Notes (Addendum)
[name], [name] ([id number]) Visit Report for 05/12/2017 Arrival Information Details Patient Name: [name], [name]. Date of Service: 05/12/2017 2:00 PM Medical Record Number: [id number] Patient Account Number: [id number] Date of Birth/Sex: [birth year] (83 y.o. Female) Treating RN: Afful, RN, BSN, Velva Harman Primary Care [place]: [name] Other Clinician: Referring Rileigh Kawashima: [name] Treating Roni Friberg/Extender: Frann Rider in Treatment: 49 Visit Information History Since Last Visit All ordered tests and consults were completed: No Patient Arrived: Wheel Chair Added or deleted any medications: No Arrival Time: 13:53 Any new allergies or adverse reactions: No Accompanied By: self Had a fall or experienced change in No activities of daily living that may affect Transfer Assistance: Harrel Lemon Lift risk of falls: Patient Identification Verified: Yes Hospitalized since last visit: No Secondary Verification Process Yes Has Dressing in Place as Prescribed: Yes Completed: Pain Present Now: Yes Patient Requires Transmission-Based No Precautions: Patient Has Alerts: No Electronic Signature(s) Signed: 05/12/2017 1:53:34 PM By: Regan Lemming BSN, RN Entered By: Regan Lemming on 05/12/2017 13:53:33 [name] ([id number]) -------------------------------------------------------------------------------- Clinic Level of Care Assessment Details Patient Name: [name] Date of Service: 05/12/2017 2:00 PM Medical Record Number: [id number] Patient Account Number: [id number] Date of Birth/Sex: 1931-01-03 (83 y.o. Female) Treating RN: Afful, RN, BSN, Administrator, sports Primary Care Marquette Piontek: [name] Other Clinician: Referring Conny Situ: [name] Treating Anhelica Fowers/Extender: Frann Rider in Treatment: 35 Clinic Level of Care Assessment Items TOOL 4 Quantity Score []  - Use when only an EandM is performed on FOLLOW-UP visit 0 ASSESSMENTS - Nursing Assessment /  Reassessment []  - Reassessment of Co-morbidities (includes updates in patient status) 0 []  - Reassessment of Adherence to Treatment Plan 0 ASSESSMENTS - Wound and Skin Assessment / Reassessment []  - Simple Wound Assessment / Reassessment - one wound 0 X - Complex Wound Assessment / Reassessment - multiple wounds 2 5 []  - Dermatologic / Skin Assessment (not related to wound area) 0 ASSESSMENTS - Focused Assessment []  - Circumferential Edema Measurements - multi extremities 0 []  - Nutritional Assessment / Counseling / Intervention 0 X - Lower Extremity Assessment (monofilament, tuning fork, pulses) 1 5 []  - Peripheral Arterial Disease Assessment (using hand held doppler) 0 ASSESSMENTS - Ostomy and/or Continence Assessment and Care []  - Incontinence Assessment and Management 0 []  - Ostomy Care Assessment and Management (repouching, etc.) 0 PROCESS - Coordination of Care X - Simple Patient / Family Education for ongoing care 1 15 []  - Complex (extensive) Patient / Family Education for ongoing care 0 X - Staff obtains Programmer, systems, Records, Test Results / Process Orders 1 10 X - Staff telephones HHA, Nursing Homes / Clarify orders / etc 1 10 []  - Routine Transfer to another Facility (non-emergent condition) 0 AVERY, EUSTICE ([id number]) []  - Routine Hospital Admission (non-emergent condition) 0 []  - New Admissions / Biomedical engineer / Ordering NPWT, Apligraf, etc. 0 []  - Emergency Hospital Admission (emergent condition) 0 []  - Simple Discharge Coordination 0 []  - Complex (extensive) Discharge Coordination 0 PROCESS - Special Needs []  - Pediatric / Minor Patient Management 0 []  - Isolation Patient Management 0 []  - Hearing / Language / Visual special needs 0 []  - Assessment of Community assistance (transportation, D/C planning, etc.) 0 []  - Additional assistance / Altered mentation 0 []  - Support Surface(s) Assessment (bed, cushion, seat, etc.) 0 INTERVENTIONS - Wound Cleansing /  Measurement []  - Simple Wound Cleansing - one wound 0 X - Complex Wound Cleansing - multiple wounds 2 5 X - Wound Imaging (photographs - any number  of wounds) 1 5 []  - Wound Tracing (instead of photographs) 0 []  - Simple Wound Measurement - one wound 0 X - Complex Wound Measurement - multiple wounds 2 5 INTERVENTIONS - Wound Dressings []  - Small Wound Dressing one or multiple wounds 0 X - Medium Wound Dressing one or multiple wounds 2 15 []  - Large Wound Dressing one or multiple wounds 0 []  - Application of Medications - topical 0 []  - Application of Medications - injection 0 INTERVENTIONS - Miscellaneous []  - External ear exam 0 Ham, Mikenna S. ([id number]) []  - Specimen Collection (cultures, biopsies, blood, body fluids, etc.) 0 []  - Specimen(s) / Culture(s) sent or taken to Lab for analysis 0 X - Patient Transfer (multiple staff / Harrel Lemon Lift / Similar devices) 1 10 []  - Simple Staple / Suture removal (25 or less) 0 []  - Complex Staple / Suture removal (26 or more) 0 []  - Hypo / Hyperglycemic Management (close monitor of Blood Glucose) 0 []  - Ankle / Brachial Index (ABI) - do not check if billed separately 0 X - Vital Signs 1 5 Has the patient been seen at the hospital within the last three years: Yes Total Score: 120 Level Of Care: New/Established - Level 4 Electronic Signature(s) Signed: 05/12/2017 4:40:57 PM By: Regan Lemming BSN, RN Entered By: Regan Lemming on 05/12/2017 14:36:42 [name] ([id number]) -------------------------------------------------------------------------------- Encounter Discharge Information Details Patient Name: [name]. Date of Service: 05/12/2017 2:00 PM Medical Record Number: [id number] Patient Account Number: [id number] Date of Birth/Sex: [birth year] (83 y.o. Female) Treating RN: Afful, RN, BSN, Velva Harman Primary Care Taggart Prasad: [name] Other Clinician: Referring Amry Cathy: [name] Treating Walther Sanagustin/Extender: Frann Rider in Treatment: 16 Encounter Discharge Information Items Discharge Pain Level: 0 Discharge Condition: Stable Ambulatory Status: Wheelchair Discharge Destination: Home Transportation: Private Auto Accompanied By: self Schedule Follow-up Appointment: No Medication Reconciliation completed and provided to Patient/Care No Donn Wilmot: Provided on Clinical Summary of Care: 05/12/2017 Form Type Recipient Paper Patient EB Electronic Signature(s) Signed: 05/12/2017 2:59:00 PM By: Regan Lemming BSN, RN Previous Signature: 05/12/2017 2:44:40 PM Version By: Ruthine Dose Entered By: Regan Lemming on 05/12/2017 14:59:00 [name] ([id number]) -------------------------------------------------------------------------------- Lower Extremity Assessment Details Patient Name: [name]. Date of Service: 05/12/2017 2:00 PM Medical Record Number: [id number] Patient Account Number: [id number] Date of Birth/Sex: 1931/06/26 (83 y.o. Female) Treating RN: Afful, RN, BSN, Velva Harman Primary Care Andrina Locken: [name] Other Clinician: Referring Imer Foxworth: [name] Treating Alexzandria Massman/Extender: Frann Rider in Treatment: 35 Edema Assessment Assessed: [Left: No] [Right: No] Edema: [Left: No] [Right: No] Vascular Assessment Pulses: Dorsalis Pedis Palpable: [Left:Yes] [Right:Yes] Posterior Tibial Extremity colors, hair growth, and conditions: Extremity Color: [Left:Normal] [Right:Normal] Hair Growth on Extremity: [Left:No] [Right:No] Temperature of Extremity: [Left:Warm] [Right:Warm] Capillary Refill: [Left:< 3 seconds] [Right:< 3 seconds] Toe Nail Assessment Left: Right: Thick: Yes Yes Discolored: Yes Yes Deformed: Yes Yes Improper Length and Hygiene: Yes Yes Electronic Signature(s) Signed: 05/12/2017 1:54:16 PM By: Regan Lemming BSN, RN Entered By: Regan Lemming on 05/12/2017 13:54:15 [name]  ([id number]) -------------------------------------------------------------------------------- Multi Wound Chart Details Patient Name: [name]. Date of Service: 05/12/2017 2:00 PM Medical Record Number: [id number] Patient Account Number: [id number] Date of Birth/Sex: Dec 07, 1930 (83 y.o. Female) Treating RN: Baruch Gouty, RN, BSN, Velva Harman Primary Care Annalynne Ibanez: [name] Other Clinician: Referring Lizzette Carbonell: [name] Treating Rosamond Andress/Extender: Frann Rider in Treatment: 35 Vital Signs Height(in): 63 Pulse(bpm): 64 Weight(lbs): 160 Blood Pressure 131/61 (mmHg): Body Mass Index(BMI): 28 Temperature(F): 98.7 Respiratory Rate 16 (breaths/min): Photos: [  1:No Photos] [2:No Photos] [N/A:N/A] Wound Location: [1:Right Calcaneus - Medial] [2:Left Calcaneus - Medial] [N/A:N/A] Wounding Event: [1:Pressure Injury] [2:Pressure Injury] [N/A:N/A] Primary Etiology: [1:Pressure Ulcer] [2:Pressure Ulcer] [N/A:N/A] Comorbid History: [1:Cataracts, Hypertension] [2:Cataracts, Hypertension] [N/A:N/A] Date Acquired: [1:07/02/2016] [2:07/02/2016] [N/A:N/A] Weeks of Treatment: [1:35] [2:35] [N/A:N/A] Wound Status: [1:Open] [2:Open] [N/A:N/A] Measurements L x W x D 0.5x0.5x0.2 [2:4x3.2x0.3] [N/A:N/A] (cm) Area (cm) : [1:0.196] [2:10.053] [N/A:N/A] Volume (cm) : [1:0.039] [2:3.016] [N/A:N/A] % Reduction in Area: [1:98.40%] [2:49.20%] [N/A:N/A] % Reduction in Volume: 96.80% [2:-52.40%] [N/A:N/A] Classification: [1:Category/Stage III] [2:Category/Stage III] [N/A:N/A] Exudate Amount: [1:Medium] [2:Large] [N/A:N/A] Exudate Type: [1:Serosanguineous] [2:Serosanguineous] [N/A:N/A] Exudate Color: [1:red, brown] [2:red, brown] [N/A:N/A] Foul Odor After [1:No] [2:Yes] [N/A:N/A] Cleansing: Odor Anticipated Due to N/A [2:No] [N/A:N/A] Product Use: Wound Margin: [1:Flat and Intact] [2:Thickened] [N/A:N/A] Granulation Amount: [1:Large (67-100%)] [2:Medium (34-66%)] [N/A:N/A] Granulation  Quality: [1:Pink, Pale] [2:Red, Pink] [N/A:N/A] Necrotic Amount: [1:Small (1-33%)] [2:Small (1-33%)] [N/A:N/A] Exposed Structures: [1:Fat Layer (Subcutaneous Tissue) Exposed: Yes Fascia: No Tendon: No] [2:Fat Layer (Subcutaneous Tissue) Exposed: Yes Fascia: No Tendon: No] [N/A:N/A] Muscle: No Muscle: No Joint: No Joint: No Bone: No Bone: No Epithelialization: Large (67-100%) None N/A Periwound Skin Texture: Excoriation: No Excoriation: No N/A Induration: No Induration: No Callus: No Callus: No Crepitus: No Crepitus: No Rash: No Rash: No Scarring: No Scarring: No Periwound Skin Maceration: No Maceration: No N/A Moisture: Dry/Scaly: No Dry/Scaly: No Periwound Skin Color: Hemosiderin Staining: Yes Hemosiderin Staining: Yes N/A Mottled: Yes Mottled: Yes Atrophie Blanche: No Atrophie Blanche: No Cyanosis: No Cyanosis: No Ecchymosis: No Ecchymosis: No Erythema: No Erythema: No Pallor: No Pallor: No Rubor: No Rubor: No Temperature: No Abnormality No Abnormality N/A Tenderness on Yes Yes N/A Palpation: Wound Preparation: Ulcer Cleansing: Ulcer Cleansing: N/A Rinsed/Irrigated with Rinsed/Irrigated with Saline Saline Topical Anesthetic Topical Anesthetic Applied: Other: lidocaine Applied: Other: lidocaine 4% 4% Treatment Notes Electronic Signature(s) Signed: 05/12/2017 2:36:14 PM By: Christin Fudge MD, FACS Entered By: Christin Fudge on 05/12/2017 14:36:13 [name] ([id number]) -------------------------------------------------------------------------------- [place] Details Patient Name: [name] Date of Service: 05/12/2017 2:00 PM Medical Record Number: [id number] Patient Account Number: [id number] Date of Birth/Sex: Sep 12, 1931 (83 y.o. Female) Treating RN: Afful, RN, BSN, Velva Harman Primary Care Burdell Peed: [name] Other Clinician: Referring Doyt Castellana: [name] Treating Oletta Buehring/Extender: Frann Rider in  Treatment: 76 Active Inactive ` Abuse / Safety / Falls / Self Care Management Nursing Diagnoses: Impaired physical mobility Potential for falls Goals: Patient will remain injury free Date Initiated: 09/06/2016 Target Resolution Date: 12/02/2016 Goal Status: Active Interventions: Assess fall risk on admission and as needed Assess self care needs on admission and as needed Notes: ` Necrotic Tissue Nursing Diagnoses: Impaired tissue integrity related to necrotic/devitalized tissue Goals: Necrotic/devitalized tissue will be minimized in the wound bed Date Initiated: 09/06/2016 Target Resolution Date: 12/02/2016 Goal Status: Active Interventions: Assess patient pain level pre-, during and post procedure and prior to discharge Notes: ` Orientation to the Wound Care Program Nursing Diagnoses: ERMAGENE, SAIDI ([id number]) Knowledge deficit related to the wound healing center program Goals: Patient/caregiver will verbalize understanding of the [facility] Program Date Initiated: 09/06/2016 Target Resolution Date: 12/02/2016 Goal Status: Active Interventions: Provide education on orientation to the wound center Notes: ` Pressure Nursing Diagnoses: Knowledge deficit related to management of pressures ulcers Potential for impaired tissue integrity related to pressure, friction, moisture, and shear Goals: Patient will remain free of pressure ulcers Date Initiated: 09/06/2016 Target Resolution Date: 12/02/2016 Goal Status: Active Interventions: Assess: immobility, friction, shearing, incontinence upon admission  and as needed Notes: ` Soft Tissue Infection Nursing Diagnoses: Impaired tissue integrity Potential for infection: soft tissue Goals: Patient will remain free of wound infection Date Initiated: 09/06/2016 Target Resolution Date: 12/02/2016 Goal Status: Active Interventions: Assess signs and symptoms of infection every visit Notes: ` Wound/Skin  Impairment SHELIE, [place]ANS[place] ([id number]) Nursing Diagnoses: Impaired tissue integrity Goals: Ulcer/skin breakdown will heal within 14 weeks Date Initiated: 09/06/2016 Target Resolution Date: 12/16/2016 Goal Status: Active Interventions: Assess ulceration(s) every visit Notes: Electronic Signature(s) Signed: 05/12/2017 4:40:57 PM By: Regan Lemming BSN, RN Entered By: Regan Lemming on 05/12/2017 14:29:44 [name] ([id number]) -------------------------------------------------------------------------------- Pain Assessment Details Patient Name: [name]. Date of Service: 05/12/2017 2:00 PM Medical Record Number: [id number] Patient Account Number: [id number] Date of Birth/Sex: 1930/12/16 (83 y.o. Female) Treating RN: Afful, RN, BSN, Velva Harman Primary Care Kaeya Schiffer: [name] Other Clinician: Referring Evi Mccomb: [name] Treating Navah Grondin/Extender: Frann Rider in Treatment: 35 Active Problems Location of Pain Severity and Description of Pain Patient Has Paino Yes Site Locations Pain Location: Pain in Ulcers Rate the pain. Current Pain Level: 4 Character of Pain Describe the Pain: Tender Pain Management and Medication Current Pain Management: Electronic Signature(s) Signed: 05/12/2017 1:53:47 PM By: Regan Lemming BSN, RN Entered By: Regan Lemming on 05/12/2017 13:53:47 [name] ([id number]) -------------------------------------------------------------------------------- Patient/Caregiver Education Details Patient Name: [name]. Date of Service: 05/12/2017 2:00 PM Medical Record Number: [id number] Patient Account Number: [id number] Date of Birth/Gender: 06/24/1931 (83 y.o. Female) Treating RN: Baruch Gouty, RN, BSN, Velva Harman Primary Care Physician: [name] Other Clinician: Referring Physician: [name] Treating Physician/Extender: Frann Rider in Treatment: 33 Education Assessment Education Provided To: Patient and Caregiver  [place] Education Topics Provided Welcome To [facility]: Methods: Explain/Verbal Responses: Reinforcements needed Electronic Signature(s) Signed: 05/12/2017 4:40:57 PM By: Regan Lemming BSN, RN Entered By: Regan Lemming on 05/12/2017 15:00:56 [name] ([id number]) -------------------------------------------------------------------------------- Wound Assessment Details Patient Name: [name]. Date of Service: 05/12/2017 2:00 PM Medical Record Number: [id number] Patient Account Number: [id number] Date of Birth/Sex: Aug 07, 1931 (83 y.o. Female) Treating RN: Afful, RN, BSN, [place] Primary Care Maricarmen Braziel: [name] Other Clinician: Referring Iantha Titsworth: [name] Treating Lilyan Prete/Extender: Frann Rider in Treatment: 35 Wound Status Wound Number: 1 Primary Etiology: Pressure Ulcer Wound Location: Right Calcaneus - Medial Wound Status: Open Wounding Event: Pressure Injury Comorbid History: Cataracts, Hypertension Date Acquired: 07/02/2016 Weeks Of Treatment: 35 Clustered Wound: No Photos Photo Uploaded By: Regan Lemming on 05/12/2017 16:46:35 Wound Measurements Length: (cm) 0.5 Width: (cm) 0.5 Depth: (cm) 0.2 Area: (cm) 0.196 Volume: (cm) 0.039 % Reduction in Area: 98.4% % Reduction in Volume: 96.8% Epithelialization: Large (67-100%) Tunneling: No Undermining: No Wound Description Classification: Category/Stage III Foul Odor Aft Wound Margin: Flat and Intact Slough/Fibrin Exudate Amount: Medium Exudate Type: Serosanguineous Exudate Color: red, brown er Cleansing: No o Yes Wound Bed Granulation Amount: Large (67-100%) Exposed Structure Granulation Quality: Pink, Pale Fascia Exposed: No Necrotic Amount: Small (1-33%) Fat Layer (Subcutaneous Tissue) Exposed: Yes Necrotic Quality: Adherent Slough Tendon Exposed: No Maiorino, Taleen S. ([id number]) Muscle Exposed: No Joint Exposed: No Bone Exposed: No Periwound Skin Texture Texture Color No  Abnormalities Noted: No No Abnormalities Noted: No Callus: No Atrophie Blanche: No Crepitus: No Cyanosis: No Excoriation: No Ecchymosis: No Induration: No Erythema: No Rash: No Hemosiderin Staining: Yes Scarring: No Mottled: Yes Pallor: No Moisture Rubor: No No Abnormalities Noted: No Dry / Scaly: No Temperature / Pain Maceration: No Temperature: No Abnormality Tenderness on Palpation: Yes Wound Preparation  Ulcer Cleansing: Rinsed/Irrigated with Saline Topical Anesthetic Applied: Other: lidocaine 4%, Treatment Notes Wound #1 (Right, Medial Calcaneus) 1. Cleansed with: Clean wound with Normal Saline 4. Dressing Applied: Aquacel Ag Hydrogel Other dressing (specify in notes) 5. Secondary Dressing Applied Guaze, ABD and kerlix/Conform 7. Secured with Tape Notes SORBACT AND HYDROGEL TO RIGHT HEEL Electronic Signature(s) Signed: 05/12/2017 4:40:57 PM By: Regan Lemming BSN, RN Entered By: Regan Lemming on 05/12/2017 14:13:22 [name] ([id number]) -------------------------------------------------------------------------------- Wound Assessment Details Patient Name: [name]. Date of Service: 05/12/2017 2:00 PM Medical Record Number: [id number] Patient Account Number: [id number] Date of Birth/Sex: 03/01/1931 (83 y.o. Female) Treating RN: Afful, RN, BSN, [place] Primary Care Darian Cansler: [name] Other Clinician: Referring Sondos Wolfman: [name] Treating Linkyn Gobin/Extender: Frann Rider in Treatment: 35 Wound Status Wound Number: 2 Primary Etiology: Pressure Ulcer Wound Location: Left Calcaneus - Medial Wound Status: Open Wounding Event: Pressure Injury Comorbid History: Cataracts, Hypertension Date Acquired: 07/02/2016 Weeks Of Treatment: 35 Clustered Wound: No Photos Photo Uploaded By: Regan Lemming on 05/12/2017 16:47:19 Wound Measurements Length: (cm) 4 % Reduction in Width: (cm) 3.2 % Reduction in Depth: (cm) 0.3 Epithelializat Area:  (cm) 10.053 Tunneling: Volume: (cm) 3.016 Undermining: Area: 49.2% Volume: -52.4% ion: None No No Wound Description Classification: Category/Stage III Foul Odor Aft Wound Margin: Thickened Due to Produc Exudate Amount: Large Slough/Fibrin Exudate Type: Serosanguineous Exudate Color: red, brown er Cleansing: Yes t Use: No o Yes Wound Bed Granulation Amount: Medium (34-66%) Exposed Structure Granulation Quality: Red, Pink Fascia Exposed: No Necrotic Amount: Small (1-33%) Fat Layer (Subcutaneous Tissue) Exposed: Yes Necrotic Quality: Adherent Slough Tendon Exposed: No Sergent, Neville S. ([id number]) Muscle Exposed: No Joint Exposed: No Bone Exposed: No Periwound Skin Texture Texture Color No Abnormalities Noted: No No Abnormalities Noted: No Callus: No Atrophie Blanche: No Crepitus: No Cyanosis: No Excoriation: No Ecchymosis: No Induration: No Erythema: No Rash: No Hemosiderin Staining: Yes Scarring: No Mottled: Yes Pallor: No Moisture Rubor: No No Abnormalities Noted: No Dry / Scaly: No Temperature / Pain Maceration: No Temperature: No Abnormality Tenderness on Palpation: Yes Wound Preparation Ulcer Cleansing: Rinsed/Irrigated with Saline Topical Anesthetic Applied: Other: lidocaine 4%, Treatment Notes Wound #2 (Left, Medial Calcaneus) 1. Cleansed with: Clean wound with Normal Saline 4. Dressing Applied: Aquacel Ag Hydrogel Other dressing (specify in notes) 5. Secondary Dressing Applied Guaze, ABD and kerlix/Conform 7. Secured with Tape Notes SORBACT AND HYDROGEL TO RIGHT HEEL Electronic Signature(s) Signed: 05/12/2017 4:40:57 PM By: Regan Lemming BSN, RN Entered By: Regan Lemming on 05/12/2017 14:13:49 [name] ([id number]) -------------------------------------------------------------------------------- Vitals Details Patient Name: [name] Date of Service: 05/12/2017 2:00 PM Medical Record Number: [id number] Patient Account  Number: [id number] Date of Birth/Sex: 01/15/1931 (83 y.o. Female) Treating RN: Afful, RN, BSN, [place] Primary Care Ronit Marczak: [name] Other Clinician: Referring Oberia Beaudoin: [name] Treating Shawntia Mangal/Extender: Frann Rider in Treatment: 35 Vital Signs Time Taken: 14:00 Temperature (F): 98.7 Height (in): 63 Pulse (bpm): 64 Weight (lbs): 160 Respiratory Rate (breaths/min): 16 Body Mass Index (BMI): 28.3 Blood Pressure (mmHg): 131/61 Reference Range: 80 - 120 mg / dl Electronic Signature(s) Signed: 05/12/2017 4:40:57 PM By: Regan Lemming BSN, RN Entered By: Regan Lemming on 05/12/2017 14:00:35

## 2017-05-19 ENCOUNTER — Encounter: Payer: Medicare HMO | Attending: Surgery | Admitting: Surgery

## 2017-05-19 DIAGNOSIS — L89322 Pressure ulcer of left buttock, stage 2: Secondary | ICD-10-CM | POA: Insufficient documentation

## 2017-05-19 DIAGNOSIS — I129 Hypertensive chronic kidney disease with stage 1 through stage 4 chronic kidney disease, or unspecified chronic kidney disease: Secondary | ICD-10-CM | POA: Diagnosis not present

## 2017-05-19 DIAGNOSIS — N189 Chronic kidney disease, unspecified: Secondary | ICD-10-CM | POA: Diagnosis not present

## 2017-05-19 DIAGNOSIS — L89613 Pressure ulcer of right heel, stage 3: Secondary | ICD-10-CM | POA: Diagnosis not present

## 2017-05-19 DIAGNOSIS — L89623 Pressure ulcer of left heel, stage 3: Secondary | ICD-10-CM | POA: Insufficient documentation

## 2017-05-19 DIAGNOSIS — Z993 Dependence on wheelchair: Secondary | ICD-10-CM | POA: Insufficient documentation

## 2017-05-19 DIAGNOSIS — D649 Anemia, unspecified: Secondary | ICD-10-CM | POA: Diagnosis not present

## 2017-05-20 NOTE — Progress Notes (Signed)
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 necrotic tissue within the wound bed including Adherent Slough. The periwound skin appearance exhibited: Hemosiderin Staining, Mottled. The periwound skin appearance did not exhibit: Callus, Crepitus, Excoriation, Induration, Rash, Scarring, Dry/Scaly, Maceration, Atrophie Blanche, Cyanosis, Ecchymosis, Pallor, Rubor, Erythema. Periwound temperature was noted as No Abnormality. The periwound has tenderness on palpation. 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 Wound #2 Left,Medial Calcaneus: Clean wound with Normal Saline. Anesthetic: Wound #1 Right,Medial Calcaneus: Topical Lidocaine 4% cream applied to wound bed prior to debridement - In clinic only Wound #2 Left,Medial Calcaneus: [name], [name]. ([id number]) Topical Lidocaine 4% cream applied to wound bed prior to debridement - In clinic only Skin Barriers/Peri-Wound Care: Wound #1 Right,Medial Calcaneus: Skin Prep Wound #2 Left,Medial  Calcaneus: Skin Prep Primary Wound Dressing: Wound #1 Right,Medial Calcaneus: Hydrogel Cutimed Sorbact - MEDICATION SENT WITH PATIENT. Wound #2 Left,Medial Calcaneus: Aquacel Ag Secondary Dressing: Wound #1 Right,Medial Calcaneus: ABD and Kerlix/Conform Wound #2 Left,Medial Calcaneus: ABD and Kerlix/Conform Dressing Change Frequency: Wound #1 Right,Medial Calcaneus: Change dressing every other day. Wound #2 Left,Medial Calcaneus: Change dressing every other day. Follow-up Appointments: Wound #1 Right,Medial Calcaneus: Return Appointment in 2 weeks. Wound #2 Left,Medial Calcaneus: Return Appointment in 2 weeks. Edema Control: Wound #1 Right,Medial Calcaneus: Elevate legs to the level of the heart and pump ankles as often as possible Wound #2 Left,Medial Calcaneus: Elevate legs to the level of the heart and pump ankles as often as possible Off-Loading: Wound #1 Right,Medial Calcaneus: Other: - No pressure on heels!!! Float heel while in chair or bed. Sage boots at night along with floating heels. Wound #2 Left,Medial Calcaneus: Other: - No pressure on heels!!! Float heel while in chair or bed. Sage boots at night along with floating heels. Additional Orders / Instructions: Wound #1 Right,Medial Calcaneus: Increase protein intake. Other: - Vitamins A, C and Zinc Wound #2 Left,Medial Calcaneus: Increase protein intake. Other: - Vitamins A, C and Zinc Home Health: Wound #1 Right,Medial Calcaneus: Continue Home Health Visits - [place] Nurse may visit PRN to address patient s wound care needs. FACE TO FACE ENCOUNTER: MEDICARE and MEDICAID PATIENTS: I certify that this patient is under [name], [name] ([id number]) my care and that I had a face-to-face encounter that meets the physician face-to-face encounter requirements with this patient on this date. The encounter with the patient was in whole or in part for the following MEDICAL CONDITION: (primary reason for  [place]) MEDICAL NECESSITY: I certify, that based on my findings, NURSING services are a medically necessary home health service. HOME BOUND STATUS: I certify that my clinical  findings support that this patient is homebound (i.e., Due to illness or injury, pt requires aid of supportive devices such as crutches, cane, wheelchairs, walkers, the use of special transportation or the assistance of another person to leave their place of residence. There is a normal inability to leave the home and doing so requires considerable and taxing effort. Other absences are for medical reasons / religious services and are infrequent or of short duration when for other reasons). If current dressing causes regression in wound condition, may D/C ordered dressing product/s and apply Normal Saline Moist Dressing daily until next [place] / Other MD appointment. [place] of regression in wound condition at [phone]. Please direct any NON-WOUND related issues/requests for orders to patient's Primary Care Physician Wound #2 Left,Medial Calcaneus: [place] Visits - [place] Nurse may visit PRN to address patient s wound care needs. FACE TO FACE ENCOUNTER: MEDICARE and MEDICAID PATIENTS: I certify that this patient is under my care and that I had a face-to-face encounter that meets the physician face-to-face encounter requirements with this patient on this date. The encounter with the patient was in whole or in part for the following MEDICAL CONDITION: (primary reason for [place]) MEDICAL NECESSITY: I certify, that based on my findings, NURSING services are a medically necessary home health service. HOME BOUND STATUS: I certify that my clinical findings support that this patient is homebound (i.e., Due to illness or injury, pt requires aid of supportive devices such as crutches, cane, wheelchairs, walkers, the use of special transportation or the  assistance of another person to leave their place of residence. There is a normal inability to leave the home and doing so requires considerable and taxing effort. Other absences are for medical reasons / religious services and are infrequent or of short duration when for other reasons). If current dressing causes regression in wound condition, may D/C ordered dressing product/s and apply Normal Saline Moist Dressing daily until next [place] / Other MD appointment. [place] of regression in wound condition at [phone]. Please direct any NON-WOUND related issues/requests for orders to patient's Primary Care Physician after review today I have recommended: 1. Sorbact with hydrogel to be applied to the right heel wound and this can be changed every other day with an appropriate offloading technique. 2. for the left medial heel we'll continue with silver alginate, on alternate days with an offloading technique 3. Continue with adequate protein, vitamin A, vitamin C and zinc 4. Regular visits to the wound center Electronic Signature(s) Signed: 05/19/2017 2:57:06 PM By: Christin Fudge MD, FACS Entered By: Christin Fudge on 05/19/2017 14:57:06 [name] ([id number]) -------------------------------------------------------------------------------- SuperBill Details Patient Name: [name] Date of Service: 05/19/2017 Medical Record Number: [id number] Patient Account Number: [id number] Date of Birth/Sex: 30-Oct-1930 (83 y.o. 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, stage 2 Facility Procedures CPT4  Code: [id number] Description: 99213 - WOUND CARE VISIT-LEV 3 EST PT Modifier: Quantity: 1 Physician Procedures CPT4 Code: [id number] Description: 87564 - WC PHYS LEVEL 3 - EST PT ICD-10 Description Diagnosis L89.623 Pressure ulcer of left heel, stage 3 L89.613 Pressure ulcer of right heel, stage 3 Z99.3 Dependence on  wheelchair Modifier: Quantity: 1 Electronic Signature(s) Signed: 05/19/2017 3:26:47 PM By: Gretta Cool, BSN, RN, CWS, Kim RN, BSN Signed: 05/19/2017 4:24:31 PM By: Christin Fudge MD, FACS Previous Signature: 05/19/2017 2:57:26 PM Version By: Christin Fudge MD, FACS Entered By: Gretta Cool BSN, RN, CWS, Kim on 05/19/2017 15:26:47

## 2017-05-23 NOTE — Progress Notes (Addendum)
JALYNN, WADDELL ([id number]) Visit Report for 05/19/2017 Arrival Information Details Patient Name: Danielle, Zuniga. Date of Service: 05/19/2017 2:00 PM Medical Record Number: [id number] Patient Account Number: [id number] Date of Birth/Sex: [birth year] (83 y.o. Female) Treating RN: Cornell Barman Primary Care Kadedra Vanaken: [name] Other Clinician: Referring Kamarii Carton: [name] Treating Trenia Tennyson/Extender: Frann Rider in Treatment: 78 Visit Information History Since Last Visit Added or deleted any medications: No Patient Arrived: Wheel Chair Any new allergies or adverse reactions: No Arrival Time: 14:22 Had a fall or experienced change in No activities of daily living that may affect Accompanied By: self risk of falls: Transfer Assistance: [employer] fast streamer Signs or symptoms of abuse/neglect since No Patient Identification Verified: Yes last visito Secondary Verification Process Yes Hospitalized since last visit: No Completed: Has Dressing in Place as Prescribed: Yes Patient Requires Transmission-Based No Has Footwear/Offloading in Place as Yes Precautions: Prescribed: Patient Has Alerts: No Left: Other:Sage boots Right: Other:Sage boots Pain Present Now: No Electronic Signature(s) Signed: 05/19/2017 3:34:01 PM By: Gretta Cool, BSN, RN, CWS, Kim RN, BSN Entered By: Gretta Cool, BSN, RN, CWS, Kim on 05/19/2017 14:22:59 [name] ([id number]) -------------------------------------------------------------------------------- Clinic Level of Care Assessment Details Patient Name: [name]. Date of Service: 05/19/2017 2:00 PM Medical Record Number: [id number] Patient Account Number: [id number] Date of Birth/Sex: October 29, 1930 (83 y.o. Female) Treating RN: Cornell Barman Primary Care Maitland Muhlbauer: [name] Other Clinician: Referring Duan Scharnhorst: [name] Treating Anyia Gierke/Extender: Frann Rider in Treatment: 36 Clinic Level of Care Assessment Items TOOL 4 Quantity  Score []  - Use when only an EandM is performed on FOLLOW-UP visit 0 ASSESSMENTS - Nursing Assessment / Reassessment []  - Reassessment of Co-morbidities (includes updates in patient status) 0 X - Reassessment of Adherence to Treatment Plan 1 5 ASSESSMENTS - Wound and Skin Assessment / Reassessment X - Simple Wound Assessment / Reassessment - one wound 1 5 []  - Complex Wound Assessment / Reassessment - multiple wounds 0 []  - Dermatologic / Skin Assessment (not related to wound area) 0 ASSESSMENTS - Focused Assessment []  - Circumferential Edema Measurements - multi extremities 0 []  - Nutritional Assessment / Counseling / Intervention 0 []  - Lower Extremity Assessment (monofilament, tuning fork, pulses) 0 []  - Peripheral Arterial Disease Assessment (using hand held doppler) 0 ASSESSMENTS - Ostomy and/or Continence Assessment and Care []  - Incontinence Assessment and Management 0 []  - Ostomy Care Assessment and Management (repouching, etc.) 0 PROCESS - Coordination of Care X - Simple Patient / Family Education for ongoing care 1 15 []  - Complex (extensive) Patient / Family Education for ongoing care 0 X - Staff obtains Programmer, systems, Records, Test Results / Process Orders 1 10 []  - Staff telephones HHA, Nursing Homes / Clarify orders / etc 0 []  - Routine Transfer to another Facility (non-emergent condition) 0 [name], [name]. ([id number]) []  - Routine Hospital Admission (non-emergent condition) 0 []  - New Admissions / Biomedical engineer / Ordering NPWT, Apligraf, etc. 0 []  - Emergency Hospital Admission (emergent condition) 0 X - Simple Discharge Coordination 1 10 []  - Complex (extensive) Discharge Coordination 0 PROCESS - Special Needs []  - Pediatric / Minor Patient Management 0 []  - Isolation Patient Management 0 []  - Hearing / Language / Visual special needs 0 []  - Assessment of Community assistance (transportation, D/C planning, etc.) 0 []  - Additional assistance / Altered mentation  0 []  - Support Surface(s) Assessment (bed, cushion, seat, etc.) 0 INTERVENTIONS - Wound Cleansing / Measurement []  - Simple Wound Cleansing - one wound 0 X -  Complex Wound Cleansing - multiple wounds 2 5 X - Wound Imaging (photographs - any number of wounds) 1 5 []  - Wound Tracing (instead of photographs) 0 []  - Simple Wound Measurement - one wound 0 X - Complex Wound Measurement - multiple wounds 2 5 INTERVENTIONS - Wound Dressings []  - Small Wound Dressing one or multiple wounds 0 X - Medium Wound Dressing one or multiple wounds 2 15 []  - Large Wound Dressing one or multiple wounds 0 []  - Application of Medications - topical 0 []  - Application of Medications - injection 0 INTERVENTIONS - Miscellaneous []  - External ear exam 0 [name], Danielle S. ([id number]) []  - Specimen Collection (cultures, biopsies, blood, body fluids, etc.) 0 []  - Specimen(s) / Culture(s) sent or taken to Lab for analysis 0 []  - Patient Transfer (multiple staff / Harrel Lemon Lift / Similar devices) 0 []  - Simple Staple / Suture removal (25 or less) 0 []  - Complex Staple / Suture removal (26 or more) 0 []  - Hypo / Hyperglycemic Management (close monitor of Blood Glucose) 0 []  - Ankle / Brachial Index (ABI) - do not check if billed separately 0 X - Vital Signs 1 5 Has the patient been seen at the hospital within the last three years: Yes Total Score: 105 Level Of Care: New/Established - Level 3 Electronic Signature(s) Signed: 05/19/2017 3:34:01 PM By: Gretta Cool, BSN, RN, CWS, Kim RN, BSN Entered By: Gretta Cool, BSN, RN, CWS, Kim on 05/19/2017 15:26:22 [name] ([id number]) -------------------------------------------------------------------------------- Encounter Discharge Information Details Patient Name: [name]. Date of Service: 05/19/2017 2:00 PM Medical Record Number: [id number] Patient Account Number: [id number] Date of Birth/Sex: 06-06-1931 (83 y.o. Female) Treating RN: Cornell Barman Primary Care Ruthanne Mcneish: [name] Other Clinician: Referring Severn Goddard: [name] Treating Alonso Gapinski/Extender: Frann Rider in Treatment: 53 Encounter Discharge Information Items Discharge Pain Level: 0 Discharge Condition: Stable Ambulatory Status: Wheelchair Discharge Destination: Home Transportation: Other Accompanied By: self Schedule Follow-up Appointment: Yes Medication Reconciliation completed Yes and provided to Patient/Care Rosemae Mcquown: Provided on Clinical Summary of Care: 05/19/2017 Form Type Recipient Paper Patient EB Notes Journalist, newspaper) Signed: 05/19/2017 3:55:40 PM By: Gretta Cool, BSN, RN, CWS, Kim RN, BSN Previous Signature: 05/19/2017 3:04:45 PM Version By: Ruthine Dose Entered By: Gretta Cool BSN, RN, CWS, Kim on 05/19/2017 15:55:40 [name] ([id number]) -------------------------------------------------------------------------------- Lower Extremity Assessment Details Patient Name: [name]. Date of Service: 05/19/2017 2:00 PM Medical Record Number: [id number] Patient Account Number: [id number] Date of Birth/Sex: May 04, 1931 (83 y.o. Female) Treating RN: Cornell Barman Primary Care Hillary Struss: [name] Other Clinician: Referring Manuel Lawhead: [name] Treating Julicia Krieger/Extender: Frann Rider in Treatment: 36 Vascular Assessment Pulses: Dorsalis Pedis Palpable: [Left:Yes] [Right:Yes] Posterior Tibial Extremity colors, hair growth, and conditions: Extremity Color: [Left:Normal] [Right:Normal] Hair Growth on Extremity: [Right:No] Temperature of Extremity: [Left:Warm] [Right:Warm] Capillary Refill: [Left:> 3 seconds] [Right:> 3 seconds] Dependent Rubor: [Left:No] [Right:No] Blanched when Elevated: [Left:No] [Right:No] Lipodermatosclerosis: [Left:No] [Right:No] Toe Nail Assessment Left: Right: Thick: Yes Yes Discolored: Yes Yes Deformed: Yes Yes Improper Length and Hygiene: Yes Yes Electronic Signature(s) Signed: 05/19/2017  3:34:01 PM By: Gretta Cool, BSN, RN, CWS, Kim RN, BSN Entered By: Gretta Cool, BSN, RN, CWS, Kim on 05/19/2017 14:34:01 [name] ([id number]) -------------------------------------------------------------------------------- Multi Wound Chart Details Patient Name: [name]. Date of Service: 05/19/2017 2:00 PM Medical Record Number: [id number] Patient Account Number: [id number] Date of Birth/Sex: 1931-08-05 (83 y.o. Female) Treating RN: Cornell Barman Primary Care Jhamir Pickup: [name] Other Clinician: Referring Shakeem Stern: [name] Treating Jamise Pentland/Extender: Con Memos  Errol Weeks in Treatment: 36 Vital Signs Height(in): 63 Pulse(bpm): 75 Weight(lbs): 160 Blood Pressure 149/63 (mmHg): Body Mass Index(BMI): 28 Temperature(F): 98.3 Respiratory Rate 16 (breaths/min): Photos: [1:No Photos] [2:No Photos] [N/A:N/A] Wound Location: [1:Right Calcaneus - Medial] [2:Left Calcaneus - Medial] [N/A:N/A] Wounding Event: [1:Pressure Injury] [2:Pressure Injury] [N/A:N/A] Primary Etiology: [1:Pressure Ulcer] [2:Pressure Ulcer] [N/A:N/A] Comorbid History: [1:Cataracts, Hypertension] [2:Cataracts, Hypertension] [N/A:N/A] Date Acquired: [1:07/02/2016] [2:07/02/2016] [N/A:N/A] Weeks of Treatment: [1:36] [2:36] [N/A:N/A] Wound Status: [1:Open] [2:Open] [N/A:N/A] Measurements L x W x D 0.5x1x0.2 [2:3.5x3x0.3] [N/A:N/A] (cm) Area (cm) : [1:0.393] [2:8.247] [N/A:N/A] Volume (cm) : [1:0.079] [2:2.474] [N/A:N/A] % Reduction in Area: [1:96.80%] [2:58.30%] [N/A:N/A] % Reduction in Volume: 93.60% [2:-25.00%] [N/A:N/A] Starting Position 1 [2:11] (o'clock): Ending Position 1 [2:2] (o'clock): Maximum Distance 1 [2:0.5] (cm): Undermining: [1:No] [2:Yes] [N/A:N/A] Classification: [1:Category/Stage III] [2:Category/Stage III] [N/A:N/A] Exudate Amount: [1:Medium] [2:Large] [N/A:N/A] Exudate Type: [1:Purulent] [2:Serosanguineous] [N/A:N/A] Exudate Color: [1:yellow, brown, green] [2:red, brown]  [N/A:N/A] Foul Odor After [1:No] [2:Yes] [N/A:N/A] Cleansing: Odor Anticipated Due to N/A [2:No] [N/A:N/A] Product Use: Wound Margin: [1:Flat and Intact] [2:Thickened] [N/A:N/A] Granulation Amount: Large (67-100%) Medium (34-66%) N/A Granulation Quality: Pink, Pale Red, Pink N/A Necrotic Amount: Small (1-33%) Small (1-33%) N/A Exposed Structures: Fat Layer (Subcutaneous Fat Layer (Subcutaneous N/A Tissue) Exposed: Yes Tissue) Exposed: Yes Fascia: No Fascia: No Tendon: No Tendon: No Muscle: No Muscle: No Joint: No Joint: No Bone: No Bone: No Epithelialization: Medium (34-66%) Small (1-33%) N/A Periwound Skin Texture: Excoriation: No Excoriation: No N/A Induration: No Induration: No Callus: No Callus: No Crepitus: No Crepitus: No Rash: No Rash: No Scarring: No Scarring: No Periwound Skin Maceration: No Maceration: No N/A Moisture: Dry/Scaly: No Dry/Scaly: No Periwound Skin Color: Hemosiderin Staining: Yes Hemosiderin Staining: Yes N/A Mottled: Yes Mottled: Yes Atrophie Blanche: No Atrophie Blanche: No Cyanosis: No Cyanosis: No Ecchymosis: No Ecchymosis: No Erythema: No Erythema: No Pallor: No Pallor: No Rubor: No Rubor: No Temperature: No Abnormality No Abnormality N/A Tenderness on Yes Yes N/A Palpation: Wound Preparation: Ulcer Cleansing: Ulcer Cleansing: N/A Rinsed/Irrigated with Rinsed/Irrigated with Saline Saline Topical Anesthetic Topical Anesthetic Applied: Other: lidocaine Applied: Other: lidocaine 4% 4% Treatment Notes Electronic Signature(s) Signed: 05/19/2017 2:54:53 PM By: Christin Fudge MD, FACS Previous Signature: 05/19/2017 2:51:12 PM Version By: Christin Fudge MD, FACS Entered By: Christin Fudge on 05/19/2017 14:54:53 [name] ([id number]) -------------------------------------------------------------------------------- Sylvester Details Patient Name: [name] Date of Service: 05/19/2017 2:00 PM Medical  Record Number: [id number] Patient Account Number: [id number] Date of Birth/Sex: 03-Mar-1931 (83 y.o. Female) Treating RN: Cornell Barman Primary Care Moksh Loomer: [name] Other Clinician: Referring Rhilyn Battle: [name] Treating Jhett Fretwell/Extender: Frann Rider in Treatment: 22 Active Inactive ` Abuse / Safety / Falls / Self Care Management Nursing Diagnoses: Impaired physical mobility Potential for falls Goals: Patient will remain injury free Date Initiated: 09/06/2016 Target Resolution Date: 12/02/2016 Goal Status: Active Interventions: Assess fall risk on admission and as needed Assess self care needs on admission and as needed Notes: ` Necrotic Tissue Nursing Diagnoses: Impaired tissue integrity related to necrotic/devitalized tissue Goals: Necrotic/devitalized tissue will be minimized in the wound bed Date Initiated: 09/06/2016 Target Resolution Date: 12/02/2016 Goal Status: Active Interventions: Assess patient pain level pre-, during and post procedure and prior to discharge Notes: ` Orientation to the Wound Care Program Nursing Diagnoses: [name], [name] ([id number]) Knowledge deficit related to the wound healing center program Goals: Patient/caregiver will verbalize understanding of the [place] Program Date Initiated: 09/06/2016 Target Resolution Date: 12/02/2016 Goal Status: Active Interventions: Provide  education on orientation to the wound center Notes: ` Pressure Nursing Diagnoses: Knowledge deficit related to management of pressures ulcers Potential for impaired tissue integrity related to pressure, friction, moisture, and shear Goals: Patient will remain free of pressure ulcers Date Initiated: 09/06/2016 Target Resolution Date: 12/02/2016 Goal Status: Active Interventions: Assess: immobility, friction, shearing, incontinence upon admission and as needed Notes: ` Soft Tissue Infection Nursing Diagnoses: Impaired tissue  integrity Potential for infection: soft tissue Goals: Patient will remain free of wound infection Date Initiated: 09/06/2016 Target Resolution Date: 12/02/2016 Goal Status: Active Interventions: Assess signs and symptoms of infection every visit Notes: ` Wound/Skin Impairment [name], [name] ([id number]) Nursing Diagnoses: Impaired tissue integrity Goals: Ulcer/skin breakdown will heal within 14 weeks Date Initiated: 09/06/2016 Target Resolution Date: 12/16/2016 Goal Status: Active Interventions: Assess ulceration(s) every visit Notes: Electronic Signature(s) Signed: 05/19/2017 3:34:01 PM By: Gretta Cool, BSN, RN, CWS, Kim RN, BSN Entered By: Gretta Cool, BSN, RN, CWS, Kim on 05/19/2017 14:34:09 [name] ([id number]) -------------------------------------------------------------------------------- Pain Assessment Details Patient Name: [name]. Date of Service: 05/19/2017 2:00 PM Medical Record Number: [id number] Patient Account Number: [id number] Date of Birth/Sex: 1931-09-27 (83 y.o. Female) Treating RN: Cornell Barman Primary Care Edvin Albus: [name] Other Clinician: Referring Jemari Hallum: [name] Treating Dyane Broberg/Extender: Frann Rider in Treatment: 36 Active Problems Location of Pain Severity and Description of Pain Patient Has Paino No Site Locations With Dressing Change: No Pain Management and Medication Current Pain Management: Electronic Signature(s) Signed: 05/19/2017 3:34:01 PM By: Gretta Cool, BSN, RN, CWS, Kim RN, BSN Entered By: Gretta Cool, BSN, RN, CWS, Kim on 05/19/2017 14:23:07 [name] ([id number]) -------------------------------------------------------------------------------- Patient/Caregiver Education Details Patient Name: [name] Date of Service: 05/19/2017 2:00 PM Medical Record Number: [id number] Patient Account Number: [id number] Date of Birth/Gender: 08-Jan-1931 (83 y.o. Female) Treating RN: Cornell Barman Primary Care Physician:  [name] Other Clinician: Referring Physician: [name] Treating Physician/Extender: Frann Rider in Treatment: 27 Education Assessment Education Provided To: Patient Education Topics Provided Wound/Skin Impairment: Handouts: Caring for Your Ulcer, Other: wound care as prescribed Methods: Demonstration Responses: State content correctly Electronic Signature(s) Signed: 05/19/2017 5:35:47 PM By: Gretta Cool, BSN, RN, CWS, Kim RN, BSN Entered By: Gretta Cool, BSN, RN, CWS, Kim on 05/19/2017 15:56:03 [name] ([id number]) -------------------------------------------------------------------------------- Wound Assessment Details Patient Name: [name]. Date of Service: 05/19/2017 2:00 PM Medical Record Number: [id number] Patient Account Number: [id number] Date of Birth/Sex: 02/20/1931 (83 y.o. Female) Treating RN: Cornell Barman Primary Care Syrenity Klepacki: [name] Other Clinician: Referring Martyna Thorns: [name] Treating Amberlee Garvey/Extender: Frann Rider in Treatment: 36 Wound Status Wound Number: 1 Primary Etiology: Pressure Ulcer Wound Location: Right Calcaneus - Medial Wound Status: Open Wounding Event: Pressure Injury Comorbid History: Cataracts, Hypertension Date Acquired: 07/02/2016 Weeks Of Treatment: 36 Clustered Wound: No Photos Photo Uploaded By: Gretta Cool, BSN, RN, CWS, Kim on 05/20/2017 11:37:44 Wound Measurements Length: (cm) 0.5 Width: (cm) 1 Depth: (cm) 0.2 Area: (cm) 0.393 Volume: (cm) 0.079 % Reduction in Area: 96.8% % Reduction in Volume: 93.6% Epithelialization: Medium (34-66%) Tunneling: No Undermining: No Wound Description Classification: Category/Stage III Wound Margin: Flat and Intact Exudate Amount: Medium Exudate Type: Purulent Exudate Color: yellow, brown, green Foul Odor After Cleansing: No Slough/Fibrino Yes Wound Bed Granulation Amount: Large (67-100%) Exposed Structure Granulation Quality: Pink,  Pale Fascia Exposed: No Necrotic Amount: Small (1-33%) Fat Layer (Subcutaneous Tissue) Exposed: Yes Necrotic Quality: Adherent Slough Tendon Exposed: No Muscle Exposed: No Joint Exposed: No Bone Exposed: No Marker, Lujuana S. ([id number]) Periwound Skin Texture Texture Color  No Abnormalities Noted: No No Abnormalities Noted: No Callus: No Atrophie Blanche: No Crepitus: No Cyanosis: No Excoriation: No Ecchymosis: No Induration: No Erythema: No Rash: No Hemosiderin Staining: Yes Scarring: No Mottled: Yes Pallor: No Moisture Rubor: No No Abnormalities Noted: No Dry / Scaly: No Temperature / Pain Maceration: No Temperature: No Abnormality Tenderness on Palpation: Yes Wound Preparation Ulcer Cleansing: Rinsed/Irrigated with Saline Topical Anesthetic Applied: Other: lidocaine 4%, Treatment Notes Wound #1 (Right, Medial Calcaneus) 1. Cleansed with: Clean wound with Normal Saline 2. Anesthetic Topical Lidocaine 4% cream to wound bed prior to debridement 4. Dressing Applied: Other dressing (specify in notes) 5. Secondary Dressing Applied ABD and Kerlix/Conform 6. Footwear/Offloading device applied Other footwear/offloading device applied (specify in notes) Notes SORBACT AND HYDROGEL TO RIGHT HEEL and Sage boots Electronic Signature(s) Signed: 05/19/2017 3:34:01 PM By: Gretta Cool, BSN, RN, CWS, Kim RN, BSN Entered By: Gretta Cool, BSN, RN, CWS, Kim on 05/19/2017 14:52:53 [name] ([id number]) -------------------------------------------------------------------------------- Wound Assessment Details Patient Name: [name]. Date of Service: 05/19/2017 2:00 PM Medical Record Number: [id number] Patient Account Number: [id number] Date of Birth/Sex: [birth year] (83 y.o. Female) Treating RN: Cornell Barman Primary Care Braylon Grenda: [name] Other Clinician: Referring Salinda Snedeker: [name] Treating Justen Fonda/Extender: Frann Rider in Treatment: 36 Wound  Status Wound Number: 2 Primary Etiology: Pressure Ulcer Wound Location: Left Calcaneus - Medial Wound Status: Open Wounding Event: Pressure Injury Comorbid History: Cataracts, Hypertension Date Acquired: 07/02/2016 Weeks Of Treatment: 36 Clustered Wound: No Photos Photo Uploaded By: Gretta Cool, BSN, RN, CWS, Kim on 05/20/2017 11:37:44 Wound Measurements Length: (cm) 3.5 Width: (cm) 3 Depth: (cm) 0.3 Area: (cm) 8.247 Volume: (cm) 2.474 % Reduction in Area: 58.3% % Reduction in Volume: -25% Epithelialization: Small (1-33%) Tunneling: No Undermining: Yes Starting Position (o'clock): 11 Ending Position (o'clock): 2 Maximum Distance: (cm) 0.5 Wound Description Classification: Category/Stage III Wound Margin: Thickened Exudate Amount: Large Exudate Type: Serosanguineous Exudate Color: red, brown Foul Odor After Cleansing: Yes Due to Product Use: No Slough/Fibrino Yes Wound Bed Granulation Amount: Medium (34-66%) Exposed Structure Granulation Quality: Red, Pink Fascia Exposed: No Necrotic Amount: Small (1-33%) Fat Layer (Subcutaneous Tissue) Exposed: Yes CATERINE, MCMEANS ([id number]) Necrotic Quality: Adherent Slough Tendon Exposed: No Muscle Exposed: No Joint Exposed: No Bone Exposed: No Periwound Skin Texture Texture Color No Abnormalities Noted: No No Abnormalities Noted: No Callus: No Atrophie Blanche: No Crepitus: No Cyanosis: No Excoriation: No Ecchymosis: No Induration: No Erythema: No Rash: No Hemosiderin Staining: Yes Scarring: No Mottled: Yes Pallor: No Moisture Rubor: No No Abnormalities Noted: No Dry / Scaly: No Temperature / Pain Maceration: No Temperature: No Abnormality Tenderness on Palpation: Yes Wound Preparation Ulcer Cleansing: Rinsed/Irrigated with Saline Topical Anesthetic Applied: Other: lidocaine 4%, Treatment Notes Wound #2 (Left, Medial Calcaneus) 1. Cleansed with: Clean wound with Normal Saline 2. Anesthetic Topical  Lidocaine 4% cream to wound bed prior to debridement 4. Dressing Applied: Aquacel Ag 5. Secondary Dressing Applied ABD and Kerlix/Conform 6. Footwear/Offloading device applied Other footwear/offloading device applied (specify in notes) 7. Secured with Tape Notes Therapist, music) Signed: 05/19/2017 3:34:01 PM By: Gretta Cool, BSN, RN, CWS, Kim RN, BSN Entered By: Gretta Cool, BSN, RN, CWS, Kim on 05/19/2017 14:30:48 [name] ([id number]) -------------------------------------------------------------------------------- Vitals Details Patient Name: [name] Date of Service: 05/19/2017 2:00 PM Medical Record Number: [id number] Patient Account Number: [id number] Date of Birth/Sex: [birth year] (83 y.o. Female) Treating RN: Cornell Barman Primary Care Darroll Bredeson: [name] Other Clinician: Referring Stephonie Wilcoxen: [name] Treating Shaquella Stamant/Extender: Frann Rider in Treatment:  36 Vital Signs Time Taken: 14:23 Temperature (F): 98.3 Height (in): 63 Pulse (bpm): 75 Weight (lbs): 160 Respiratory Rate (breaths/min): 16 Body Mass Index (BMI): 28.3 Blood Pressure (mmHg): 149/63 Reference Range: 80 - 120 mg / dl Electronic Signature(s) Signed: 05/19/2017 3:34:01 PM By: Gretta Cool, BSN, RN, CWS, Kim RN, BSN Entered By: Gretta Cool, BSN, RN, CWS, Kim on 05/19/2017 14:25:51

## 2017-05-26 ENCOUNTER — Encounter: Payer: Medicare HMO | Admitting: Physician Assistant

## 2017-05-26 ENCOUNTER — Telehealth: Payer: Self-pay | Admitting: Urology

## 2017-05-26 DIAGNOSIS — L89623 Pressure ulcer of left heel, stage 3: Secondary | ICD-10-CM | POA: Diagnosis not present

## 2017-05-26 NOTE — Telephone Encounter (Signed)
Pt's urine goes in her diaper, not in her bag.  She's not scheduled to come in until 8/20.  Please call pt [phone]

## 2017-05-28 NOTE — Progress Notes (Signed)
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 the vendor samples are done. As far as her left heel goes we will check with the vendor to see if there are any samples available. 04/28/17 on evaluation today patient heels bilaterally appear to be doing okay although there is slough covering both wounds. She has continued to do about the same over several weeks when it comes to her bilateral heels. She is tolerating the dressing changes and has only minimal discomfort. 05/26/17 on evaluation today patient appears to be doing well in regard to her bilateral heal wounds. The right heel wound in particular is doing very well and is much smaller of the left heel wound is slowly progressing. [name], [name] ([id number]) She has been tolerating the dressing changes without complication. No fevers, chills, nausea, or vomiting noted at this time. Objective Constitutional Well-nourished and well-hydrated in no acute distress. Vitals Time Taken: 1:47 PM, Height: 63 in, Weight: 160 lbs, BMI: 28.3, Temperature: 98.5 F, Pulse: 66 bpm, Respiratory Rate: 16 breaths/min, Blood Pressure: 127/45 mmHg. Respiratory normal  breathing without difficulty. Psychiatric this patient is able to make decisions and demonstrates good insight into disease process. Alert and Oriented x 3. pleasant and cooperative. General Notes: Bilateral heal wounds appeared to be slough covered in sharp debridement was required. I was able to remove necrotic tissue from both wounds as well as subcutaneous tissue and exudate. Integumentary (Hair, Skin) Wound #1 status is Open. Original cause of wound was Pressure Injury. The wound is located on the Right,Medial Calcaneus. The wound measures 0.4cm length x 0.3cm width x 0.1cm depth; 0.094cm^2 area and 0.009cm^3 volume. There is Fat Layer (Subcutaneous Tissue) Exposed exposed. There is no tunneling or undermining noted. There is a medium amount of purulent drainage noted. The wound margin is flat and intact. There is medium (34-66%) pink, pale granulation within the wound bed. There is a medium (34-66%) amount of necrotic tissue within the wound bed including Adherent Slough. The periwound skin appearance exhibited: Hemosiderin Staining, Mottled. The periwound skin appearance did not exhibit: Callus, Crepitus, Excoriation, Induration, Rash, Scarring, Dry/Scaly, Maceration, Atrophie Blanche, Cyanosis, Ecchymosis, Pallor, Rubor, Erythema. Periwound temperature was noted as No Abnormality. The periwound has tenderness on palpation. Wound #2 status is Open. Original cause of wound was Pressure Injury. The wound is located on the Left,Medial Calcaneus. The wound measures 3.4cm length x 3.1cm width x 0.2cm depth; 8.278cm^2 area and 1.656cm^3 volume. There is Fat Layer (Subcutaneous Tissue) Exposed exposed. There is no tunneling or undermining noted. There is a large amount of serosanguineous drainage noted. The wound margin is thickened. There is medium (34-66%) red, pink granulation within the wound bed. There is a medium (34- 66%) amount of necrotic tissue within the wound bed including Adherent  Slough. The periwound skin appearance exhibited: Hemosiderin Staining, Mottled. The periwound skin appearance did not exhibit: [name], [name]. ([id number]) Callus, Crepitus, Excoriation, Induration, Rash, Scarring, Dry/Scaly, Maceration, Atrophie Blanche, Cyanosis, Ecchymosis, Pallor, Rubor, Erythema. Periwound temperature was noted as No Abnormality. The periwound has tenderness on palpation. 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 There was a Skin/Subcutaneous Tissue Debridement ([id number]) debridement with total area of 0.12 sq cm performed by STONE III,  E., PA-C. with the following instrument(s): Curette to remove Viable and Non-Viable tissue/material including Exudate, Fibrin/Slough, and Subcutaneous after achieving pain control using Lidocaine 4% Topical Solution. A time out was conducted at 14:55, prior to the start of the procedure. A Minimum amount of bleeding was controlled with Pressure. The procedure was tolerated well with a pain level of 0 throughout and a pain level of 0 following the procedure. Post Debridement Measurements: 0.4cm length x 0.3cm width x 0.2cm depth; 0.019cm^3 volume. Post debridement Stage noted as Category/Stage III. Character of Wound/Ulcer Post Debridement requires further debridement. Post procedure Diagnosis Wound #1: Same as Pre-Procedure Wound #2 Pre-procedure diagnosis of Wound #2 is a Pressure Ulcer located on the Left,Medial Calcaneus . There was a Skin/Subcutaneous Tissue Debridement ([id number]) debridement with total area of 10.54 sq cm performed by STONE III,  E., PA-C. with the following instrument(s): Curette to remove Viable and Non-Viable  tissue/material including Exudate, Fibrin/Slough, and Subcutaneous after achieving pain control using Lidocaine 4% Topical Solution. A time out was conducted at 14:55, prior to the start of the procedure. A Minimum amount of bleeding was controlled with Pressure. The procedure was tolerated well with a pain level of 0 throughout and a pain level of 0 following the procedure. Post Debridement Measurements: 3.4cm length x 3.1cm width x 0.3cm depth; 2.483cm^3 volume. Post debridement Stage noted as Category/Stage III. Character of Wound/Ulcer Post Debridement requires further debridement. Post procedure Diagnosis Wound #2: Same as Pre-Procedure [name], [name]. ([id number]) Plan Wound Cleansing: Wound #1 Right,Medial Calcaneus: Clean wound with Normal Saline. Wound #2 Left,Medial Calcaneus: Clean wound with Normal Saline. Anesthetic: Wound #1 Right,Medial Calcaneus: Topical Lidocaine 4% cream applied to wound bed prior to debridement - In clinic only Wound #2 Left,Medial Calcaneus: Topical Lidocaine 4% cream applied to wound bed prior to debridement - In clinic only Skin Barriers/Peri-Wound Care: Wound #1 Right,Medial Calcaneus: Skin Prep Wound #2 Left,Medial Calcaneus: Skin Prep Primary Wound Dressing: Wound #1 Right,Medial Calcaneus: Hydrogel Cutimed Sorbact Wound #2 Left,Medial Calcaneus: Hydrogel Cutimed Sorbact Secondary Dressing: Wound #1 Right,Medial Calcaneus: ABD and Kerlix/Conform Wound #2 Left,Medial Calcaneus: ABD and Kerlix/Conform Dressing Change Frequency: Wound #1 Right,Medial Calcaneus: Change dressing every other day. Wound #2 Left,Medial Calcaneus: Change dressing every other day. Follow-up Appointments: Wound #1 Right,Medial Calcaneus: Return Appointment in 2 weeks. Wound #2 Left,Medial Calcaneus: Return Appointment in 2 weeks. Edema Control: Wound #1 Right,Medial Calcaneus: Elevate legs to the level of the heart and pump ankles as often as  possible Wound #2 Left,Medial Calcaneus: Elevate legs to the level of the heart and pump ankles as often as possible [name], Danielle S. ([id number]) Off-Loading: Wound #1 Right,Medial Calcaneus: Other: - No pressure on heels!!! Float heel while in chair or bed. Sage boots at night along with floating heels. Wound #2 Left,Medial Calcaneus: Other: - No pressure on heels!!! Float heel while in chair or bed. Sage boots at night along with floating heels. Additional Orders / Instructions: Wound #1 Right,Medial Calcaneus: Increase protein intake. Other: - Vitamins A, C and Zinc Wound #2 Left,Medial Calcaneus: Increase protein intake. Other: - Vitamins A, C and Zinc Home Health: Wound #1  Right,Medial Calcaneus: Continue Home Health Visits - [place] Nurse may visit PRN to address patient s wound care needs. FACE TO FACE ENCOUNTER: MEDICARE and MEDICAID PATIENTS: I certify that this patient is under my care and that I had a face-to-face encounter that meets the physician face-to-face encounter requirements with this patient on this date. The encounter with the patient was in whole or in part for the following MEDICAL CONDITION: (primary reason for [place]) MEDICAL NECESSITY: I certify, that based on my findings, NURSING services are a medically necessary home health service. HOME BOUND STATUS: I certify that my clinical findings support that this patient is homebound (i.e., Due to illness or injury, pt requires aid of supportive devices such as crutches, cane, wheelchairs, walkers, the use of special transportation or the assistance of another person to leave their place of residence. There is a normal inability to leave the home and doing so requires considerable and taxing effort. Other absences are for medical reasons / religious services and are infrequent or of short duration when for other reasons). If current dressing causes regression in wound condition, may D/C  ordered dressing product/s and apply Normal Saline Moist Dressing daily until next [place] / Other MD appointment. [place] of regression in wound condition at [phone]. Please direct any NON-WOUND related issues/requests for orders to patient's Primary Care Physician Wound #2 Left,Medial Calcaneus: [place] Visits - [place] Nurse may visit PRN to address patient s wound care needs. FACE TO FACE ENCOUNTER: MEDICARE and MEDICAID PATIENTS: I certify that this patient is under my care and that I had a face-to-face encounter that meets the physician face-to-face encounter requirements with this patient on this date. The encounter with the patient was in whole or in part for the following MEDICAL CONDITION: (primary reason for [place]) MEDICAL NECESSITY: I certify, that based on my findings, NURSING services are a medically necessary home health service. HOME BOUND STATUS: I certify that my clinical findings support that this patient is homebound (i.e., Due to illness or injury, pt requires aid of supportive devices such as crutches, cane, wheelchairs, walkers, the use of special transportation or the assistance of another person to leave their place of residence. There is a normal inability to leave the home and doing so requires considerable and taxing effort. Other absences are for medical reasons / religious services and are infrequent or of short duration when for other reasons). If current dressing causes regression in wound condition, may D/C ordered dressing product/s and apply Normal Saline Moist Dressing daily until next [place] / Other MD appointment. [place] of regression in wound condition at [phone]. Please direct any NON-WOUND related issues/requests for orders to patient's Primary Care Physician General Notes: We are going to change the left heel dressing to a [employer].  We will see were ADYA, [name] ([id number]) things stand over the next week in that regard. If anything worsens a particular patient will contact our office for additional recommendations. Otherwise hopefully she will continue to show signs of improvement. Electronic Signature(s) Signed: 05/27/2017 10:18:42 AM By: Worthy Keeler PA-C Entered By: Worthy Keeler on 05/26/2017 15:07:38 [name] ([id number]) -------------------------------------------------------------------------------- SuperBill Details Patient Name: [name] Date of Service: 05/26/2017 Medical Record Number: [id number] Patient Account Number: [id number] Date of Birth/Sex: 04/27/1931 (83 y.o. 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 L89.322 Pressure ulcer of left buttock, stage 2 Facility Procedures CPT4 Code: [id number] Description: 98614 - DEB SUBQ TISSUE 20 SQ CM/< ICD-10 Description Diagnosis L89.623 Pressure ulcer of left heel, stage 3 L89.613 Pressure ulcer of right heel, stage 3 Modifier: Quantity: 1 Physician Procedures CPT4 Code: [id number] Description: 30148 - WC PHYS SUBQ TISS 20 SQ CM ICD-10 Description Diagnosis L89.623 Pressure ulcer of left heel, stage 3 L89.613 Pressure ulcer of right heel, stage 3 Modifier: Quantity: 1 Electronic Signature(s) Signed: 05/27/2017 10:18:42 AM By: Worthy Keeler PA-C Entered By: Worthy Keeler on 05/26/2017 15:09:59

## 2017-05-28 NOTE — Progress Notes (Signed)
AARION, KITTRELL ([id number]) Visit Report for 05/26/2017 Arrival Information Details Patient Name: [name], [name]. Date of Service: 05/26/2017 2:00 PM Medical Record Number: [id number] Patient Account Number: [id number] Date of Birth/Sex: 1930-10-23 (83 y.o. Female) Treating RN: Montey Hora Primary Care Lemmie Vanlanen: [name] Other Clinician: Referring Sherree Shankman: [name] Treating Tajanay Hurley/Extender: Melburn Hake, HOYT Weeks in Treatment: 56 Visit Information History Since Last Visit Added or deleted any medications: No Patient Arrived: Wheel Chair Any new allergies or adverse reactions: No Arrival Time: 13:42 Had a fall or experienced change in No activities of daily living that may affect Accompanied By: self risk of falls: Transfer Assistance: Hoyer Lift Signs or symptoms of abuse/neglect since last No Patient Identification Verified: Yes visito Secondary Verification Process Yes Hospitalized since last visit: No Completed: Has Dressing in Place as Prescribed: Yes Patient Requires Transmission-Based No Pain Present Now: No Precautions: Patient Has Alerts: No Electronic Signature(s) Signed: 05/26/2017 4:54:09 PM By: Montey Hora Entered By: Montey Hora on 05/26/2017 13:47:08 [name] ([id number]) -------------------------------------------------------------------------------- Encounter Discharge Information Details Patient Name: [name]. Date of Service: 05/26/2017 2:00 PM Medical Record Number: [id number] Patient Account Number: [id number] Date of Birth/Sex: 08/27/1931 (83 y.o. Female) Treating RN: Cornell Barman Primary Care Carliss Quast: [name] Other Clinician: Referring Khadim Lundberg: [name] Treating Piccola Arico/Extender: Melburn Hake, HOYT Weeks in Treatment: 6 Encounter Discharge Information Items Discharge Pain Level: 0 Discharge Condition: Stable Ambulatory Status: Wheelchair Discharge Destination: Home Transportation:  Other Accompanied By: self Schedule Follow-up Appointment: Yes Medication Reconciliation completed No and provided to Patient/Care Ivyana Locey: Provided on Clinical Summary of Care: 05/26/2017 Form Type Recipient Paper Patient EB Electronic Signature(s) Signed: 05/26/2017 4:59:12 PM By: Alric Quan Entered By: Alric Quan on 05/26/2017 16:47:11 [name], [name] ([id number]) -------------------------------------------------------------------------------- Lower Extremity Assessment Details Patient Name: [name]. Date of Service: 05/26/2017 2:00 PM Medical Record Number: [id number] Patient Account Number: [id number] Date of Birth/Sex: 13-Oct-1931 (83 y.o. Female) Treating RN: Montey Hora Primary Care Brenly Trawick: [name] Other Clinician: Referring Aydee Mcnew: [name] Treating Calyb Mcquarrie/Extender: Melburn Hake, HOYT Weeks in Treatment: 37 Vascular Assessment Pulses: Dorsalis Pedis Palpable: [Left:Yes] [Right:Yes] Posterior Tibial Extremity colors, hair growth, and conditions: Extremity Color: [Left:Normal] [Right:Normal] Temperature of Extremity: [Left:Warm] Capillary Refill: [Left:> 3 seconds] [Right:> 3 seconds] Electronic Signature(s) Signed: 05/26/2017 4:54:09 PM By: Montey Hora Entered By: Montey Hora on 05/26/2017 13:59:51 [name], [name] ([id number]) -------------------------------------------------------------------------------- Multi Wound Chart Details Patient Name: [name]. Date of Service: 05/26/2017 2:00 PM Medical Record Number: [id number] Patient Account Number: [id number] Date of Birth/Sex: 1931/06/12 (83 y.o. Female) Treating RN: Montey Hora Primary Care Prakriti Carignan: [name] Other Clinician: Referring Federica Allport: [name] Treating Mekia Dipinto/Extender: Melburn Hake, HOYT Weeks in Treatment: 37 Vital Signs Height(in): 63 Pulse(bpm): 66 Weight(lbs): 160 Blood Pressure 127/45 (mmHg): Body Mass Index(BMI):  28 Temperature(F): 98.5 Respiratory Rate 16 (breaths/min): Photos: [1:No Photos] [2:No Photos] [N/A:N/A] Wound Location: [1:Right Calcaneus - Medial] [2:Left Calcaneus - Medial] [N/A:N/A] Wounding Event: [1:Pressure Injury] [2:Pressure Injury] [N/A:N/A] Primary Etiology: [1:Pressure Ulcer] [2:Pressure Ulcer] [N/A:N/A] Comorbid History: [1:Cataracts, Hypertension] [2:Cataracts, Hypertension] [N/A:N/A] Date Acquired: [1:07/02/2016] [2:07/02/2016] [N/A:N/A] Weeks of Treatment: [1:37] [2:37] [N/A:N/A] Wound Status: [1:Open] [2:Open] [N/A:N/A] Measurements L x W x D 0.4x0.3x0.1 [2:3.4x3.1x0.2] [N/A:N/A] (cm) Area (cm) : [1:0.094] [2:8.278] [N/A:N/A] Volume (cm) : [1:0.009] [2:1.656] [N/A:N/A] % Reduction in Area: [1:99.20%] [2:58.20%] [N/A:N/A] % Reduction in Volume: 99.30% [2:16.30%] [N/A:N/A] Classification: [1:Category/Stage III] [2:Category/Stage III] [N/A:N/A] Exudate Amount: [1:Medium] [2:Large] [N/A:N/A] Exudate Type: [1:Purulent] [2:Serosanguineous] [N/A:N/A] Exudate Color: [1:yellow, brown, green] [2:red, brown] [N/A:N/A] Foul Odor  After [1:No] [2:Yes] [N/A:N/A] Cleansing: Odor Anticipated Due to N/A [2:No] [N/A:N/A] Product Use: Wound Margin: [1:Flat and Intact] [2:Thickened] [N/A:N/A] Granulation Amount: [1:Medium (34-66%)] [2:Medium (34-66%)] [N/A:N/A] Granulation Quality: [1:Pink, Pale] [2:Red, Pink] [N/A:N/A] Necrotic Amount: [1:Medium (34-66%)] [2:Medium (34-66%)] [N/A:N/A] Exposed Structures: [1:Fat Layer (Subcutaneous Tissue) Exposed: Yes Fascia: No Tendon: No] [2:Fat Layer (Subcutaneous Tissue) Exposed: Yes Fascia: No Tendon: No] [N/A:N/A] Muscle: No Muscle: No Joint: No Joint: No Bone: No Bone: No Epithelialization: Medium (34-66%) Small (1-33%) N/A Periwound Skin Texture: Excoriation: No Excoriation: No N/A Induration: No Induration: No Callus: No Callus: No Crepitus: No Crepitus: No Rash: No Rash: No Scarring: No Scarring: No Periwound Skin  Maceration: No Maceration: No N/A Moisture: Dry/Scaly: No Dry/Scaly: No Periwound Skin Color: Hemosiderin Staining: Yes Hemosiderin Staining: Yes N/A Mottled: Yes Mottled: Yes Atrophie Blanche: No Atrophie Blanche: No Cyanosis: No Cyanosis: No Ecchymosis: No Ecchymosis: No Erythema: No Erythema: No Pallor: No Pallor: No Rubor: No Rubor: No Temperature: No Abnormality No Abnormality N/A Tenderness on Yes Yes N/A Palpation: Wound Preparation: Ulcer Cleansing: Ulcer Cleansing: N/A Rinsed/Irrigated with Rinsed/Irrigated with Saline Saline Topical Anesthetic Topical Anesthetic Applied: Other: lidocaine Applied: Other: lidocaine 4% 4% Treatment Notes Electronic Signature(s) Signed: 05/26/2017 4:54:09 PM By: Montey Hora Entered By: Montey Hora on 05/26/2017 14:00:11 [name] ([id number]) -------------------------------------------------------------------------------- [place] Details Patient Name: [name] Date of Service: 05/26/2017 2:00 PM Medical Record Number: [id number] Patient Account Number: [id number] Date of Birth/Sex: Jun 28, 1931 (83 y.o. Female) Treating RN: Montey Hora Primary Care Kennadi Albany: [name] Other Clinician: Referring Virgel Haro: [name] Treating Mylene Bow/Extender: Melburn Hake, HOYT Weeks in Treatment: 12 Active Inactive ` Abuse / Safety / Falls / Self Care Management Nursing Diagnoses: Impaired physical mobility Potential for falls Goals: Patient will remain injury free Date Initiated: 09/06/2016 Target Resolution Date: 12/02/2016 Goal Status: Active Interventions: Assess fall risk on admission and as needed Assess self care needs on admission and as needed Notes: ` Necrotic Tissue Nursing Diagnoses: Impaired tissue integrity related to necrotic/devitalized tissue Goals: Necrotic/devitalized tissue will be minimized in the wound bed Date Initiated: 09/06/2016 Target Resolution Date:  12/02/2016 Goal Status: Active Interventions: Assess patient pain level pre-, during and post procedure and prior to discharge Notes: ` Orientation to the Wound Care Program Nursing Diagnoses: [name], [name] ([id number]) Knowledge deficit related to the wound healing center program Goals: Patient/caregiver will verbalize understanding of the [place] Program Date Initiated: 09/06/2016 Target Resolution Date: 12/02/2016 Goal Status: Active Interventions: Provide education on orientation to the wound center Notes: ` Pressure Nursing Diagnoses: Knowledge deficit related to management of pressures ulcers Potential for impaired tissue integrity related to pressure, friction, moisture, and shear Goals: Patient will remain free of pressure ulcers Date Initiated: 09/06/2016 Target Resolution Date: 12/02/2016 Goal Status: Active Interventions: Assess: immobility, friction, shearing, incontinence upon admission and as needed Notes: ` Soft Tissue Infection Nursing Diagnoses: Impaired tissue integrity Potential for infection: soft tissue Goals: Patient will remain free of wound infection Date Initiated: 09/06/2016 Target Resolution Date: 12/02/2016 Goal Status: Active Interventions: Assess signs and symptoms of infection every visit Notes: ` Wound/Skin Impairment [name], [name] ([id number]) Nursing Diagnoses: Impaired tissue integrity Goals: Ulcer/skin breakdown will heal within 14 weeks Date Initiated: 09/06/2016 Target Resolution Date: 12/16/2016 Goal Status: Active Interventions: Assess ulceration(s) every visit Notes: Electronic Signature(s) Signed: 05/26/2017 4:54:09 PM By: Montey Hora Entered By: Montey Hora on 05/26/2017 14:00:00 [name] ([id number]) -------------------------------------------------------------------------------- Pain Assessment Details Patient Name: [name]. Date of Service: 05/26/2017 2:00  PM Medical Record Number:  [id number] Patient Account Number: [id number] Date of Birth/Sex: 12-29-1930 (83 y.o. Female) Treating RN: Montey Hora Primary Care Jairus Tonne: [name] Other Clinician: Referring Emojean Gertz: [name] Treating Adileny Delon/Extender: Melburn Hake, HOYT Weeks in Treatment: 37 Active Problems Location of Pain Severity and Description of Pain Patient Has Paino No Site Locations Pain Management and Medication Current Pain Management: Electronic Signature(s) Signed: 05/26/2017 4:54:09 PM By: Montey Hora Entered By: Montey Hora on 05/26/2017 13:53:05 [name] ([id number]) -------------------------------------------------------------------------------- Patient/Caregiver Education Details Patient Name: [name]. Date of Service: 05/26/2017 2:00 PM Medical Record Number: [id number] Patient Account Number: [id number] Date of Birth/Gender: [birth year] (83 y.o. Female) Treating RN: Carolyne Fiscal, Debi Primary Care Physician: [name] Other Clinician: Referring Physician: [name] Treating Physician/Extender: Sharalyn Ink in Treatment: 32 Education Assessment Education Provided To: Patient Education Topics Provided Wound/Skin Impairment: Handouts: Other: change dressing as ordered Methods: Demonstration, Explain/Verbal Responses: State content correctly Electronic Signature(s) Signed: 05/26/2017 4:59:12 PM By: Alric Quan Entered By: Alric Quan on 05/26/2017 16:47:21 [name], [name] ([id number]) -------------------------------------------------------------------------------- Wound Assessment Details Patient Name: [name]. Date of Service: 05/26/2017 2:00 PM Medical Record Number: [id number] Patient Account Number: [id number] Date of Birth/Sex: [birth year] (83 y.o. Female) Treating RN: Montey Hora Primary Care Rain Friedt: [name] Other Clinician: Referring Aalijah Lanphere: [name] Treating Machell Wirthlin/Extender: Melburn Hake, HOYT Weeks in Treatment: 37 Wound Status Wound Number: 1 Primary Etiology: Pressure Ulcer Wound Location: Right Calcaneus - Medial Wound Status: Open Wounding Event: Pressure Injury Comorbid History: Cataracts, Hypertension Date Acquired: 07/02/2016 Weeks Of Treatment: 37 Clustered Wound: No Photos Photo Uploaded By: Gretta Cool, BSN, RN, CWS, Kim on 05/26/2017 15:41:49 Wound Measurements Length: (cm) 0.4 Width: (cm) 0.3 Depth: (cm) 0.1 Area: (cm) 0.094 Volume: (cm) 0.009 % Reduction in Area: 99.2% % Reduction in Volume: 99.3% Epithelialization: Medium (34-66%) Tunneling: No Undermining: No Wound Description Classification: Category/Stage III Wound Margin: Flat and Intact Exudate Amount: Medium Exudate Type: Purulent Exudate Color: yellow, brown, green Foul Odor After Cleansing: No Slough/Fibrino Yes Wound Bed Granulation Amount: Medium (34-66%) Exposed Structure Granulation Quality: Pink, Pale Fascia Exposed: No Necrotic Amount: Medium (34-66%) Fat Layer (Subcutaneous Tissue) Exposed: Yes Necrotic Quality: Adherent Slough Tendon Exposed: No [name], Danielle S. ([id number]) Muscle Exposed: No Joint Exposed: No Bone Exposed: No Periwound Skin Texture Texture Color No Abnormalities Noted: No No Abnormalities Noted: No Callus: No Atrophie Blanche: No Crepitus: No Cyanosis: No Excoriation: No Ecchymosis: No Induration: No Erythema: No Rash: No Hemosiderin Staining: Yes Scarring: No Mottled: Yes Pallor: No Moisture Rubor: No No Abnormalities Noted: No Dry / Scaly: No Temperature / Pain Maceration: No Temperature: No Abnormality Tenderness on Palpation: Yes Wound Preparation Ulcer Cleansing: Rinsed/Irrigated with Saline Topical Anesthetic Applied: Other: lidocaine 4%, Treatment Notes Wound #1 (Right, Medial Calcaneus) 1. Cleansed with: Clean wound with Normal Saline 2. Anesthetic Topical Lidocaine 4% cream to wound bed prior to debridement 4.  Dressing Applied: Hydrogel Other dressing (specify in notes) 5. Secondary Dressing Applied Dry Gauze Kerlix/Conform 7. Secured with Tape Notes sorbact, heel cup Electronic Signature(s) Signed: 05/26/2017 4:54:09 PM By: Montey Hora Entered By: Montey Hora on 05/26/2017 13:58:03 [name] ([id number]) -------------------------------------------------------------------------------- Wound Assessment Details Patient Name: [name]. Date of Service: 05/26/2017 2:00 PM Medical Record Number: [id number] Patient Account Number: [id number] Date of Birth/Sex: [birth year] (84 y.o. Female) Treating RN: Montey Hora Primary Care Chaeli Judy: [name] Other Clinician: Referring Venisha Boehning: [name] Treating Garek Schuneman/Extender: STONE III, HOYT Weeks in Treatment: 37 Wound Status Wound Number: 2  Primary Etiology: Pressure Ulcer Wound Location: Left Calcaneus - Medial Wound Status: Open Wounding Event: Pressure Injury Comorbid History: Cataracts, Hypertension Date Acquired: 07/02/2016 Weeks Of Treatment: 37 Clustered Wound: No Photos Photo Uploaded By: Gretta Cool, BSN, RN, CWS, Kim on 05/26/2017 15:41:50 Wound Measurements Length: (cm) 3.4 Width: (cm) 3.1 Depth: (cm) 0.2 Area: (cm) 8.278 Volume: (cm) 1.656 % Reduction in Area: 58.2% % Reduction in Volume: 16.3% Epithelialization: Small (1-33%) Tunneling: No Undermining: No Wound Description Classification: Category/Stage III Wound Margin: Thickened Exudate Amount: Large Exudate Type: Serosanguineous Exudate Color: red, brown Foul Odor After Cleansing: Yes Due to Product Use: No Slough/Fibrino Yes Wound Bed Granulation Amount: Medium (34-66%) Exposed Structure Granulation Quality: Red, Pink Fascia Exposed: No Necrotic Amount: Medium (34-66%) Fat Layer (Subcutaneous Tissue) Exposed: Yes Necrotic Quality: Adherent Slough Tendon Exposed: No Hemm, Shandel S. ([id number]) Muscle Exposed: No Joint  Exposed: No Bone Exposed: No Periwound Skin Texture Texture Color No Abnormalities Noted: No No Abnormalities Noted: No Callus: No Atrophie Blanche: No Crepitus: No Cyanosis: No Excoriation: No Ecchymosis: No Induration: No Erythema: No Rash: No Hemosiderin Staining: Yes Scarring: No Mottled: Yes Pallor: No Moisture Rubor: No No Abnormalities Noted: No Dry / Scaly: No Temperature / Pain Maceration: No Temperature: No Abnormality Tenderness on Palpation: Yes Wound Preparation Ulcer Cleansing: Rinsed/Irrigated with Saline Topical Anesthetic Applied: Other: lidocaine 4%, Treatment Notes Wound #2 (Left, Medial Calcaneus) 1. Cleansed with: Clean wound with Normal Saline 2. Anesthetic Topical Lidocaine 4% cream to wound bed prior to debridement 4. Dressing Applied: Hydrogel Other dressing (specify in notes) 5. Secondary Dressing Applied Dry Gauze Kerlix/Conform 7. Secured with Tape Notes sorbact, heel cup Electronic Signature(s) Signed: 05/26/2017 4:54:09 PM By: Montey Hora Entered By: Montey Hora on 05/26/2017 13:59:11 [name] ([id number]) -------------------------------------------------------------------------------- Vitals Details Patient Name: [name] Date of Service: 05/26/2017 2:00 PM Medical Record Number: [id number] Patient Account Number: [id number] Date of Birth/Sex: 1931-09-06 (83 y.o. Female) Treating RN: Montey Hora Primary Care Madiline Saffran: [name] Other Clinician: Referring Rasaan Brotherton: [name] Treating Kendra Woolford/Extender: Melburn Hake, HOYT Weeks in Treatment: 37 Vital Signs Time Taken: 13:47 Temperature (F): 98.5 Height (in): 63 Pulse (bpm): 66 Weight (lbs): 160 Respiratory Rate (breaths/min): 16 Body Mass Index (BMI): 28.3 Blood Pressure (mmHg): 127/45 Reference Range: 80 - 120 mg / dl Electronic Signature(s) Signed: 05/26/2017 4:54:09 PM By: Montey Hora Entered By: Montey Hora on 05/26/2017  13:53:41

## 2017-05-31 NOTE — Telephone Encounter (Signed)
Spoke with pt in reference to foley draining. Pt stated that the urine is now going into her bag unless she has a bladder spasm. Reinforced with pt to take myrbetriq as rx. Pt voiced understanding.

## 2017-06-02 ENCOUNTER — Encounter: Payer: Medicare HMO | Admitting: Physician Assistant

## 2017-06-02 DIAGNOSIS — L89623 Pressure ulcer of left heel, stage 3: Secondary | ICD-10-CM | POA: Diagnosis not present

## 2017-06-03 NOTE — Progress Notes (Signed)
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 this patient's application today in regard to the right heel. This appears to be healing well and we are going to continue with application at this point. There is no evidence of infection in the left heel is also doing better. 04/14/2017-- the patient had a total of 8 applications of Affinity to her right heel and the vendor samples are done. As far as her left heel goes we will check with the vendor to see if there are any samples available. 04/28/17 on evaluation today patient heels bilaterally appear to be doing okay although there is slough covering both wounds. She has continued to do about the same over several weeks when it comes to her bilateral heels. She is tolerating the dressing changes and has only minimal discomfort. 05/26/17 on evaluation today patient appears to be doing well in regard to her bilateral heal wounds. The right heel wound in particular is doing very well and is much smaller of the left heel wound is slowly progressing. [name], [name] ([id number]) She has been tolerating the dressing changes without complication. No fevers, chills, nausea, or vomiting noted at this time. 06/02/17 on evaluation today patient's wounds appeared to be doing about the same. She does not have any significant  overall improvement of her wounds at this point. They also do not appear to be significantly worse which is good news. She is having some discomfort in regard to the right lower extremity but this is minimal and only with cleansing of the wound. The left is nontender. No fevers, chills, nausea, or vomiting noted at this time. Objective Constitutional Well-nourished and well-hydrated in no acute distress. Vitals Time Taken: 11:46 AM, Height: 63 in, Weight: 160 lbs, BMI: 28.3, Temperature: 98.4 F, Pulse: 65 bpm, Respiratory Rate: 16 breaths/min, Blood Pressure: 155/69 mmHg. Respiratory normal breathing without difficulty. Psychiatric this patient is able to make decisions and demonstrates good insight into disease process. Alert and Oriented x 3. pleasant and cooperative. General Notes: In regard to patients bilateral heal wounds there is no evidence of erythema or purulent discharge noted. There is slough covering the granular surface although there is granulation noted bilaterally. This did require sharp debridement and she tolerated this well today without complication and post debridement the wounds looked very nice and clean Integumentary (Hair, Skin) Wound #1 status is Open. Original cause of wound was Pressure Injury. The wound is located on the Right,Medial Calcaneus. The wound measures 0.5cm length x 0.7cm width x 0.2cm depth; 0.275cm^2 area and 0.055cm^3 volume. There is Fat Layer (Subcutaneous Tissue) Exposed exposed. There is no tunneling or undermining noted. There is a medium amount of purulent drainage noted. The wound margin is flat and intact. There is medium (34-66%) pink, pale granulation within the wound bed. There is a medium (34-66%) amount of necrotic tissue within the wound bed including Adherent Slough. The periwound skin appearance exhibited: Hemosiderin Staining, Mottled. The periwound skin appearance did not exhibit: Callus, Crepitus, Excoriation, Induration,  Rash, Scarring, Dry/Scaly, Maceration, Atrophie Blanche, Cyanosis, Ecchymosis, Pallor, Rubor, Erythema. Periwound temperature was noted as No Abnormality. The periwound has tenderness on palpation.  [name], [name] ([id number]) Wound #2 status is Open. Original cause of wound was Pressure Injury. The wound is located on the Left,Medial Calcaneus. The wound measures 3.5cm length x 3cm width x 0.4cm depth; 8.247cm^2 area and 3.299cm^3 volume. There is Fat Layer (Subcutaneous Tissue) Exposed exposed. There is no tunneling or undermining noted. There is a large amount of serosanguineous drainage noted. The wound margin is thickened. There is medium (34-66%) red, pink granulation within the wound bed. There is a medium (34- 66%) amount of necrotic tissue within the wound bed including Adherent Slough. The periwound skin appearance exhibited: Hemosiderin Staining, Mottled. The periwound skin appearance did not exhibit: Callus, Crepitus, Excoriation, Induration, Rash, Scarring, Dry/Scaly, Maceration, Atrophie Blanche, Cyanosis, Ecchymosis, Pallor, Rubor, Erythema. Periwound temperature was noted as No Abnormality. The periwound has tenderness on palpation. 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 There was a Skin/Subcutaneous Tissue Debridement ([id number]) debridement with total area of 0.35 sq cm performed by STONE III, Amarissa Koerner E., PA-C. with the following instrument(s): Curette to remove Viable and Non-Viable tissue/material including Subcutaneous after achieving pain control using Other (lidocaine 4%). A time out was conducted at 12:05, prior to the start of the procedure. A Minimum amount of bleeding  was controlled with Pressure. The procedure was tolerated well with a pain level of 2 throughout and a pain level of 1 following the procedure. Post Debridement Measurements: 0.5cm length x 0.7cm width x 0.3cm depth; 0.082cm^3 volume. Post debridement Stage noted as Category/Stage III. Character of Wound/Ulcer Post Debridement is improved. Post procedure Diagnosis Wound #1: Same as Pre-Procedure Wound #2 Pre-procedure diagnosis of Wound #2 is a Pressure Ulcer located on the Left,Medial Calcaneus . There was a Skin/Subcutaneous Tissue Debridement ([id number]) debridement with total area of 10.5 sq cm performed by STONE III, Ranie Chinchilla E., PA-C. with the following instrument(s): Curette to remove Viable and Non-Viable tissue/material including Callus and Subcutaneous after achieving pain control using Other (lidocaine 4%). A time out was conducted at 12:05, prior to the start of the procedure. A Minimum amount of [name], Danielle S. ([id number]) bleeding was controlled with Pressure. The procedure was tolerated well with a pain level of 2 throughout and a pain level of 1 following the procedure. Post Debridement Measurements: 3.5cm length x 3cm width x 0.5cm depth; 4.123cm^3 volume. Post debridement Stage noted as Category/Stage III. Character of Wound/Ulcer Post Debridement is improved. Post procedure Diagnosis Wound #2: Same as Pre-Procedure Plan Wound Cleansing: Wound #1 Right,Medial Calcaneus: Clean wound with Normal Saline. Wound #2 Left,Medial Calcaneus: Clean wound with Normal Saline. Anesthetic: Wound #1 Right,Medial Calcaneus: Topical Lidocaine 4% cream applied to wound bed prior to debridement - In clinic only Wound #2 Left,Medial Calcaneus: Topical Lidocaine 4% cream applied to wound bed prior to debridement - In clinic only Skin Barriers/Peri-Wound Care: Wound #1 Right,Medial Calcaneus: Skin Prep Wound #2 Left,Medial Calcaneus: Skin Prep Primary Wound Dressing: Wound #1  Right,Medial Calcaneus: Aquacel Ag Wound #2 Left,Medial Calcaneus: Aquacel Ag Secondary Dressing: Wound #1 Right,Medial Calcaneus: Dry Gauze Other - allevyn heel cup Wound #2 Left,Medial Calcaneus: Dry Gauze Other - allevyn heel cup Dressing Change Frequency: Wound #1 Right,Medial Calcaneus: Change dressing every other day. Wound #2 Left,Medial Calcaneus: Change dressing every other day. Follow-up Appointments:  Wound #1 Right,Medial Calcaneus: Return Appointment in 2 weeks. Wound #2 Left,Medial Calcaneus: Return Appointment in 2 weeks. [name], [name] ([id number]) Edema Control: Wound #1 Right,Medial Calcaneus: Elevate legs to the level of the heart and pump ankles as often as possible Wound #2 Left,Medial Calcaneus: Elevate legs to the level of the heart and pump ankles as often as possible Off-Loading: Wound #1 Right,Medial Calcaneus: Other: - No pressure on heels!!! Float heel while in chair or bed. Sage boots at night along with floating heels. Wound #2 Left,Medial Calcaneus: Other: - No pressure on heels!!! Float heel while in chair or bed. Sage boots at night along with floating heels. Additional Orders / Instructions: Wound #1 Right,Medial Calcaneus: Increase protein intake. Other: - Vitamins A, C and Zinc Wound #2 Left,Medial Calcaneus: Increase protein intake. Other: - Vitamins A, C and Zinc Home Health: Wound #1 Right,Medial Calcaneus: Continue Home Health Visits - [place] Nurse may visit PRN to address patient s wound care needs. FACE TO FACE ENCOUNTER: MEDICARE and MEDICAID PATIENTS: I certify that this patient is under my care and that I had a face-to-face encounter that meets the physician face-to-face encounter requirements with this patient on this date. The encounter with the patient was in whole or in part for the following MEDICAL CONDITION: (primary reason for [place]) MEDICAL NECESSITY: I certify, that based on my findings,  NURSING services are a medically necessary home health service. HOME BOUND STATUS: I certify that my clinical findings support that this patient is homebound (i.e., Due to illness or injury, pt requires aid of supportive devices such as crutches, cane, wheelchairs, walkers, the use of special transportation or the assistance of another person to leave their place of residence. There is a normal inability to leave the home and doing so requires considerable and taxing effort. Other absences are for medical reasons / religious services and are infrequent or of short duration when for other reasons). If current dressing causes regression in wound condition, may D/C ordered dressing product/s and apply Normal Saline Moist Dressing daily until next [facility] / Other MD appointment. [place] of regression in wound condition at [phone]. Please direct any NON-WOUND related issues/requests for orders to patient's Primary Care Physician Wound #2 Left,Medial Calcaneus: [place] Visits - [place] Nurse may visit PRN to address patient s wound care needs. FACE TO FACE ENCOUNTER: MEDICARE and MEDICAID PATIENTS: I certify that this patient is under my care and that I had a face-to-face encounter that meets the physician face-to-face encounter requirements with this patient on this date. The encounter with the patient was in whole or in part for the following MEDICAL CONDITION: (primary reason for [place]) MEDICAL NECESSITY: I certify, that based on my findings, NURSING services are a medically necessary home health service. HOME BOUND STATUS: I certify that my clinical findings support that this patient is homebound (i.e., Due to illness or injury, pt requires aid of supportive devices such as crutches, cane, wheelchairs, walkers, the use of special transportation or the assistance of another person to leave their place of residence. There is  a normal inability to leave the home and doing so requires considerable and taxing effort. Other absences are for medical reasons / religious services and are infrequent or of short duration when for other reasons). If current dressing causes regression in wound condition, may D/C ordered dressing product/s and apply [name], [name]. ([id number]) Normal Saline Moist Dressing daily until next  [facility] Other MD appointment. [place] of regression in wound condition at [phone]. Please direct any NON-WOUND related issues/requests for orders to patient's Primary Care Physician I'm gonna recommend that we switch to silver alginate for both heel wound today. We will see how things do over the next two weeks. If anything worsens in the interim she will contact our office for additional recommendations. She did inquire if there's anything that she can do to help her wounds. I told her that the best thing would be to ensure that she is eating good amounts and sources of protein and to try to avoid using her shields to shift yourself in bed if that something that is occurring although she's not sure that it is. Hopefully we will continue to notice improvement going forward. Electronic Signature(s) Signed: 06/02/2017 12:41:32 PM By: Worthy Keeler PA-C Entered By: Worthy Keeler on 06/02/2017 12:27:55 [name] ([id number]) -------------------------------------------------------------------------------- SuperBill Details Patient Name: [name] Date of Service: 06/02/2017 Medical Record Number: [id number] Patient Account Number: [id number] Date of Birth/Sex: 1931-03-19 (83 y.o. Female) Treating RN: Cornell Barman Primary Care Provider: [name] Other Clinician: Referring Provider: [name] Treating Provider/Extender: Melburn Hake, Alera Quevedo Weeks in Treatment: 38 Diagnosis Coding ICD-10 Codes Code Description L89.623 Pressure ulcer of left heel,  stage 3 L89.613 Pressure ulcer of right heel, stage 3 Z99.3 Dependence on wheelchair L89.322 Pressure ulcer of left buttock, stage 2 Facility Procedures CPT4 Code: [id number] Description: 76546 - DEB SUBQ TISSUE 20 SQ CM/< ICD-10 Description Diagnosis L89.623 Pressure ulcer of left heel, stage 3 L89.613 Pressure ulcer of right heel, stage 3 Modifier: Quantity: 1 Physician Procedures CPT4 Code: [id number] Description: 11042 - WC PHYS SUBQ TISS 20 SQ CM ICD-10 Description Diagnosis L89.623 Pressure ulcer of left heel, stage 3 L89.613 Pressure ulcer of right heel, stage 3 Modifier: Quantity: 1 Electronic Signature(s) Signed: 06/02/2017 12:41:32 PM By: Worthy Keeler PA-C Entered By: Worthy Keeler on 06/02/2017 12:28:11

## 2017-06-06 ENCOUNTER — Ambulatory Visit: Payer: Medicare HMO

## 2017-06-06 NOTE — Progress Notes (Signed)
TAI, SYFERT ([id number]) Visit Report for 06/02/2017 Arrival Information Details Patient Name: [name], [name]. Date of Service: 06/02/2017 11:15 AM Medical Record Number: [id number] Patient Account Number: [id number] Date of Birth/Sex: 26-Aug-1931 (83 y.o. Female) Treating RN: Carolyne Fiscal, Debi Primary Care Priya Matsen: [name] Other Clinician: Referring Ravenna Legore: [name] Treating Ilyssa Grennan/Extender: Melburn Hake, HOYT Weeks in Treatment: 57 Visit Information History Since Last Visit All ordered tests and consults were completed: No Patient Arrived: Wheel Chair Added or deleted any medications: No Arrival Time: 11:45 Any new allergies or adverse reactions: No Accompanied By: self Had a fall or experienced change in No activities of daily living that may affect Transfer Assistance: Harrel Lemon Lift risk of falls: Patient Identification Verified: Yes Signs or symptoms of abuse/neglect since last No Secondary Verification Process Yes visito Completed: Hospitalized since last visit: No Patient Requires Transmission-Based No Has Dressing in Place as Prescribed: Yes Precautions: Pain Present Now: No Patient Has Alerts: No Electronic Signature(s) Signed: 06/02/2017 2:40:18 PM By: Alric Quan Entered By: Alric Quan on 06/02/2017 11:46:13 [name] ([id number]) -------------------------------------------------------------------------------- Encounter Discharge Information Details Patient Name: [name]. Date of Service: 06/02/2017 11:15 AM Medical Record Number: [id number] Patient Account Number: [id number] Date of Birth/Sex: 1931/05/16 (83 y.o. Female) Treating RN: Carolyne Fiscal, Debi Primary Care Hyland Mollenkopf: [name] Other Clinician: Referring Taryll Reichenberger: [name] Treating Darnella Zeiter/Extender: Melburn Hake, HOYT Weeks in Treatment: 23 Encounter Discharge Information Items Discharge Pain Level: 0 Discharge Condition: Stable Ambulatory Status:  Wheelchair Discharge Destination: Home Transportation: Private Auto Accompanied By: self Schedule Follow-up Appointment: Yes Medication Reconciliation completed and provided to Patient/Care No Joh Rao: Provided on Clinical Summary of Care: 06/02/2017 Form Type Recipient Paper Patient EB Electronic Signature(s) Signed: 06/03/2017 1:46:33 PM By: Ruthine Dose Entered By: Ruthine Dose on 06/02/2017 12:24:31 [name] ([id number]) -------------------------------------------------------------------------------- Lower Extremity Assessment Details Patient Name: [name]. Date of Service: 06/02/2017 11:15 AM Medical Record Number: [id number] Patient Account Number: [id number] Date of Birth/Sex: 06-Nov-1930 (83 y.o. Female) Treating RN: Carolyne Fiscal, Debi Primary Care Lorrine Killilea: [name] Other Clinician: Referring Jory Tanguma: [name] Treating Raiyah Speakman/Extender: Melburn Hake, HOYT Weeks in Treatment: 38 Vascular Assessment Pulses: Dorsalis Pedis Palpable: [Left:Yes] [Right:Yes] Posterior Tibial Extremity colors, hair growth, and conditions: Extremity Color: [Left:Normal] [Right:Normal] Temperature of Extremity: [Left:Warm] [Right:Warm] Capillary Refill: [Left:< 3 seconds] [Right:< 3 seconds] Toe Nail Assessment Left: Right: Thick: No No Discolored: No No Deformed: No No Improper Length and Hygiene: No No Electronic Signature(s) Signed: 06/02/2017 2:40:18 PM By: Alric Quan Entered By: Alric Quan on 06/02/2017 12:03:39 [name] ([id number]) -------------------------------------------------------------------------------- Multi Wound Chart Details Patient Name: [name]. Date of Service: 06/02/2017 11:15 AM Medical Record Number: [id number] Patient Account Number: [id number] Date of Birth/Sex: Sep 26, 1931 (83 y.o. Female) Treating RN: Cornell Barman Primary Care Prisila Dlouhy: [name] Other Clinician: Referring Kahdijah Errickson: [name] Treating Art Levan/Extender: Melburn Hake, HOYT Weeks in Treatment: 38 Vital Signs Height(in): 63 Pulse(bpm): 65 Weight(lbs): 160 Blood Pressure 155/69 (mmHg): Body Mass Index(BMI): 28 Temperature(F): 98.4 Respiratory Rate 16 (breaths/min): Photos: [1:No Photos] [2:No Photos] [N/A:N/A] Wound Location: [1:Right Calcaneus - Medial] [2:Left Calcaneus - Medial] [N/A:N/A] Wounding Event: [1:Pressure Injury] [2:Pressure Injury] [N/A:N/A] Primary Etiology: [1:Pressure Ulcer] [2:Pressure Ulcer] [N/A:N/A] Comorbid History: [1:Cataracts, Hypertension] [2:Cataracts, Hypertension] [N/A:N/A] Date Acquired: [1:07/02/2016] [2:07/02/2016] [N/A:N/A] Weeks of Treatment: [1:38] [2:38] [N/A:N/A] Wound Status: [1:Open] [2:Open] [N/A:N/A] Measurements L x W x D 0.5x0.7x0.2 [2:3.5x3x0.4] [N/A:N/A] (cm) Area (cm) : [1:0.275] [2:8.247] [N/A:N/A] Volume (cm) : [1:0.055] [6:6.063] [N/A:N/A] % Reduction in Area: [1:97.80%] [2:58.30%] [N/A:N/A] % Reduction in  Volume: 95.60% [2:-66.70%] [N/A:N/A] Classification: [1:Category/Stage III] [2:Category/Stage III] [N/A:N/A] Exudate Amount: [1:Medium] [2:Large] [N/A:N/A] Exudate Type: [1:Purulent] [2:Serosanguineous] [N/A:N/A] Exudate Color: [1:yellow, brown, green] [2:red, brown] [N/A:N/A] Foul Odor After [1:No] [2:Yes] [N/A:N/A] Cleansing: Odor Anticipated Due to N/A [2:No] [N/A:N/A] Product Use: Wound Margin: [1:Flat and Intact] [2:Thickened] [N/A:N/A] Granulation Amount: [1:Medium (34-66%)] [2:Medium (34-66%)] [N/A:N/A] Granulation Quality: [1:Pink, Pale] [2:Red, Pink] [N/A:N/A] Necrotic Amount: [1:Medium (34-66%)] [2:Medium (34-66%)] [N/A:N/A] Exposed Structures: [1:Fat Layer (Subcutaneous Tissue) Exposed: Yes Fascia: No Tendon: No] [2:Fat Layer (Subcutaneous Tissue) Exposed: Yes Fascia: No Tendon: No] [N/A:N/A] Muscle: No Muscle: No Joint: No Joint: No Bone: No Bone: No Epithelialization: Medium (34-66%) Small (1-33%) N/A Periwound Skin  Texture: Excoriation: No Excoriation: No N/A Induration: No Induration: No Callus: No Callus: No Crepitus: No Crepitus: No Rash: No Rash: No Scarring: No Scarring: No Periwound Skin Maceration: No Maceration: No N/A Moisture: Dry/Scaly: No Dry/Scaly: No Periwound Skin Color: Hemosiderin Staining: Yes Hemosiderin Staining: Yes N/A Mottled: Yes Mottled: Yes Atrophie Blanche: No Atrophie Blanche: No Cyanosis: No Cyanosis: No Ecchymosis: No Ecchymosis: No Erythema: No Erythema: No Pallor: No Pallor: No Rubor: No Rubor: No Temperature: No Abnormality No Abnormality N/A Tenderness on Yes Yes N/A Palpation: Wound Preparation: Ulcer Cleansing: Ulcer Cleansing: N/A Rinsed/Irrigated with Rinsed/Irrigated with Saline Saline Topical Anesthetic Topical Anesthetic Applied: Other: lidocaine Applied: Other: lidocaine 4% 4% Treatment Notes Electronic Signature(s) Signed: 06/02/2017 1:20:34 PM By: Gretta Cool, BSN, RN, CWS, Kim RN, BSN Entered By: Gretta Cool, BSN, RN, CWS, Kim on 06/02/2017 12:09:41 [name] ([id number]) -------------------------------------------------------------------------------- [place] Details Patient Name: [name], [name]. Date of Service: 06/02/2017 11:15 AM Medical Record Number: [id number] Patient Account Number: [id number] Date of Birth/Sex: November 13, 1930 (83 y.o. Female) Treating RN: Cornell Barman Primary Care Dellamae Rosamilia: [name] Other Clinician: Referring Kaoru Benda: [name] Treating Maribel Hadley/Extender: Melburn Hake, HOYT Weeks in Treatment: 32 Active Inactive ` Abuse / Safety / Falls / Self Care Management Nursing Diagnoses: Impaired physical mobility Potential for falls Goals: Patient will remain injury free Date Initiated: 09/06/2016 Target Resolution Date: 12/02/2016 Goal Status: Active Interventions: Assess fall risk on admission and as needed Assess self care needs on admission and as needed Notes: ` Necrotic  Tissue Nursing Diagnoses: Impaired tissue integrity related to necrotic/devitalized tissue Goals: Necrotic/devitalized tissue will be minimized in the wound bed Date Initiated: 09/06/2016 Target Resolution Date: 12/02/2016 Goal Status: Active Interventions: Assess patient pain level pre-, during and post procedure and prior to discharge Notes: ` Orientation to the Wound Care Program Nursing Diagnoses: [name], [name] ([id number]) Knowledge deficit related to the wound healing center program Goals: Patient/caregiver will verbalize understanding of the [place] [facility] Date Initiated: 09/06/2016 Target Resolution Date: 12/02/2016 Goal Status: Active Interventions: Provide education on orientation to the wound center Notes: ` Pressure Nursing Diagnoses: Knowledge deficit related to management of pressures ulcers Potential for impaired tissue integrity related to pressure, friction, moisture, and shear Goals: Patient will remain free of pressure ulcers Date Initiated: 09/06/2016 Target Resolution Date: 12/02/2016 Goal Status: Active Interventions: Assess: immobility, friction, shearing, incontinence upon admission and as needed Notes: ` Soft Tissue Infection Nursing Diagnoses: Impaired tissue integrity Potential for infection: soft tissue Goals: Patient will remain free of wound infection Date Initiated: 09/06/2016 Target Resolution Date: 12/02/2016 Goal Status: Active Interventions: Assess signs and symptoms of infection every visit Notes: ` Wound/Skin Impairment [name], [name] ([id number]) Nursing Diagnoses: Impaired tissue integrity Goals: Ulcer/skin breakdown will heal within 14 weeks Date Initiated: 09/06/2016 Target Resolution Date: 12/16/2016 Goal Status: Active Interventions: Assess  ulceration(s) every visit Notes: Electronic Signature(s) Signed: 06/02/2017 1:20:34 PM By: Gretta Cool, BSN, RN, CWS, Kim RN, BSN Entered By: Gretta Cool, BSN, RN, CWS, Kim on  06/02/2017 12:09:30 [name] ([id number]) -------------------------------------------------------------------------------- Pain Assessment Details Patient Name: [name] Date of Service: 06/02/2017 11:15 AM Medical Record Number: [id number] Patient Account Number: [id number] Date of Birth/Sex: 1931/08/14 (83 y.o. Female) Treating RN: Carolyne Fiscal, Debi Primary Care Bellany Elbaum: [name] Other Clinician: Referring Fraida Veldman: [name] Treating Laveah Gloster/Extender: Melburn Hake, HOYT Weeks in Treatment: 38 Active Problems Location of Pain Severity and Description of Pain Patient Has Paino No Site Locations With Dressing Change: No Pain Management and Medication Current Pain Management: Electronic Signature(s) Signed: 06/02/2017 2:40:18 PM By: Alric Quan Entered By: Alric Quan on 06/02/2017 11:46:36 [name] ([id number]) -------------------------------------------------------------------------------- Patient/Caregiver Education Details Patient Name: [name] Date of Service: 06/02/2017 11:15 AM Medical Record Number: [id number] Patient Account Number: [id number] Date of Birth/Gender: 1931/05/16 (83 y.o. Female) Treating RN: Carolyne Fiscal, Debi Primary Care Physician: [name] Other Clinician: Referring Physician: [name] Treating Physician/Extender: Worthy Keeler Weeks in Treatment: 30 Education Assessment Education Provided To: Patient Education Topics Provided Wound/Skin Impairment: Handouts: Other: change dressing as ordered Methods: Demonstration, Explain/Verbal Responses: State content correctly Electronic Signature(s) Signed: 06/02/2017 2:40:18 PM By: Alric Quan Entered By: Alric Quan on 06/02/2017 12:21:46 [name] ([id number]) -------------------------------------------------------------------------------- Wound Assessment Details Patient Name: [name]. Date of Service: 06/02/2017 11:15  AM Medical Record Number: [id number] Patient Account Number: [id number] Date of Birth/Sex: 1930-11-11 (83 y.o. Female) Treating RN: Carolyne Fiscal, Debi Primary Care Vivia Rosenburg: [name] Other Clinician: Referring Rickesha Veracruz: [name] Treating Jacinda Kanady/Extender: Melburn Hake, HOYT Weeks in Treatment: 38 Wound Status Wound Number: 1 Primary Etiology: Pressure Ulcer Wound Location: Right Calcaneus - Medial Wound Status: Open Wounding Event: Pressure Injury Comorbid History: Cataracts, Hypertension Date Acquired: 07/02/2016 Weeks Of Treatment: 38 Clustered Wound: No Photos Photo Uploaded By: Montey Hora on 06/02/2017 13:58:53 Wound Measurements Length: (cm) 0.5 Width: (cm) 0.7 Depth: (cm) 0.2 Area: (cm) 0.275 Volume: (cm) 0.055 % Reduction in Area: 97.8% % Reduction in Volume: 95.6% Epithelialization: Medium (34-66%) Tunneling: No Undermining: No Wound Description Classification: Category/Stage III Wound Margin: Flat and Intact Exudate Amount: Medium Exudate Type: Purulent Exudate Color: yellow, brown, green Foul Odor After Cleansing: No Slough/Fibrino Yes Wound Bed Granulation Amount: Medium (34-66%) Exposed Structure Granulation Quality: Pink, Pale Fascia Exposed: No Necrotic Amount: Medium (34-66%) Fat Layer (Subcutaneous Tissue) Exposed: Yes Necrotic Quality: Adherent Slough Tendon Exposed: No [name], Danielle S. ([id number]) Muscle Exposed: No Joint Exposed: No Bone Exposed: No Periwound Skin Texture Texture Color No Abnormalities Noted: No No Abnormalities Noted: No Callus: No Atrophie Blanche: No Crepitus: No Cyanosis: No Excoriation: No Ecchymosis: No Induration: No Erythema: No Rash: No Hemosiderin Staining: Yes Scarring: No Mottled: Yes Pallor: No Moisture Rubor: No No Abnormalities Noted: No Dry / Scaly: No Temperature / Pain Maceration: No Temperature: No Abnormality Tenderness on Palpation: Yes Wound Preparation Ulcer  Cleansing: Rinsed/Irrigated with Saline Topical Anesthetic Applied: Other: lidocaine 4%, Treatment Notes Wound #1 (Right, Medial Calcaneus) 1. Cleansed with: Clean wound with Normal Saline 2. Anesthetic Topical Lidocaine 4% cream to wound bed prior to debridement 4. Dressing Applied: Aquacel Ag 5. Secondary Dressing Applied Dry Gauze Kerlix/Conform 7. Secured with Recruitment consultant) Signed: 06/02/2017 2:40:18 PM By: Alric Quan Entered By: Alric Quan on 06/02/2017 12:02:20 [name] ([id number]) -------------------------------------------------------------------------------- Wound Assessment Details Patient Name: [name]. Date of Service: 06/02/2017 11:15 AM Medical Record Number: [id number] Patient  Account Number: [id number] Date of Birth/Sex: May 08, 1931 (83 y.o. Female) Treating RN: Carolyne Fiscal, Debi Primary Care Mariaelena Cade: [name] Other Clinician: Referring Khiley Lieser: [name] Treating Joss Friedel/Extender: Melburn Hake, HOYT Weeks in Treatment: 38 Wound Status Wound Number: 2 Primary Etiology: Pressure Ulcer Wound Location: Left Calcaneus - Medial Wound Status: Open Wounding Event: Pressure Injury Comorbid History: Cataracts, Hypertension Date Acquired: 07/02/2016 Weeks Of Treatment: 38 Clustered Wound: No Photos Photo Uploaded By: Montey Hora on 06/02/2017 13:58:54 Wound Measurements Length: (cm) 3.5 Width: (cm) 3 Depth: (cm) 0.4 Area: (cm) 8.247 Volume: (cm) 3.299 % Reduction in Area: 58.3% % Reduction in Volume: -66.7% Epithelialization: Small (1-33%) Tunneling: No Undermining: No Wound Description Classification: Category/Stage III Wound Margin: Thickened Exudate Amount: Large Exudate Type: Serosanguineous Exudate Color: red, brown Foul Odor After Cleansing: Yes Due to Product Use: No Slough/Fibrino Yes Wound Bed Granulation Amount: Medium (34-66%) Exposed Structure Granulation Quality: Red,  Pink Fascia Exposed: No Necrotic Amount: Medium (34-66%) Fat Layer (Subcutaneous Tissue) Exposed: Yes Necrotic Quality: Adherent Slough Tendon Exposed: No [name], Danielle S. ([id number]) Muscle Exposed: No Joint Exposed: No Bone Exposed: No Periwound Skin Texture Texture Color No Abnormalities Noted: No No Abnormalities Noted: No Callus: No Atrophie Blanche: No Crepitus: No Cyanosis: No Excoriation: No Ecchymosis: No Induration: No Erythema: No Rash: No Hemosiderin Staining: Yes Scarring: No Mottled: Yes Pallor: No Moisture Rubor: No No Abnormalities Noted: No Dry / Scaly: No Temperature / Pain Maceration: No Temperature: No Abnormality Tenderness on Palpation: Yes Wound Preparation Ulcer Cleansing: Rinsed/Irrigated with Saline Topical Anesthetic Applied: Other: lidocaine 4%, Treatment Notes Wound #2 (Left, Medial Calcaneus) 1. Cleansed with: Clean wound with Normal Saline 2. Anesthetic Topical Lidocaine 4% cream to wound bed prior to debridement 4. Dressing Applied: Aquacel Ag 5. Secondary Dressing Applied Dry Gauze Kerlix/Conform 7. Secured with Recruitment consultant) Signed: 06/02/2017 2:40:18 PM By: Alric Quan Entered By: Alric Quan on 06/02/2017 12:03:16 [name] ([id number]) -------------------------------------------------------------------------------- [place] Details Patient Name: [name] Date of Service: 06/02/2017 11:15 AM Medical Record Number: [id number] Patient Account Number: [id number] Date of Birth/Sex: 16-Oct-1931 (83 y.o. Female) Treating RN: Carolyne Fiscal, Debi Primary Care Toy Samarin: [name] Other Clinician: Referring Raghad Lorenz: [name] Treating Shyquan Stallbaumer/Extender: Melburn Hake, HOYT Weeks in Treatment: 38 Vital Signs Time Taken: 11:46 Temperature (F): 98.4 Height (in): 63 Pulse (bpm): 65 Weight (lbs): 160 Respiratory Rate (breaths/min): 16 Body Mass Index (BMI): 28.3 Blood Pressure  (mmHg): 155/69 Reference Range: 80 - 120 mg / dl Electronic Signature(s) Signed: 06/02/2017 2:40:18 PM By: Alric Quan Entered By: Alric Quan on 06/02/2017 11:47:35

## 2017-06-09 ENCOUNTER — Ambulatory Visit (INDEPENDENT_AMBULATORY_CARE_PROVIDER_SITE_OTHER): Payer: Medicare HMO

## 2017-06-09 ENCOUNTER — Ambulatory Visit: Payer: Medicare HMO | Admitting: Surgery

## 2017-06-09 VITALS — BP 159/73 | HR 74 | Ht 63.0 in

## 2017-06-09 DIAGNOSIS — R339 Retention of urine, unspecified: Secondary | ICD-10-CM | POA: Diagnosis not present

## 2017-06-09 NOTE — Progress Notes (Signed)
Suprapubic Cath Change  Patient is present today for a suprapubic catheter change due to urinary retention.  97ml of water was drained from the balloon, a 16FR foley cath was removed from the tract with out difficulty.  Site was cleaned and prepped in a sterile fashion with betadine.  A 16FR foley cath was replaced into the tract no complications were noted. Urine return was noted, 10 ml of sterile water was inflated into the balloon and a night bag was attached for drainage.  Patient tolerated well. A night bag was given to patient and proper instruction was given on how to switch bags.    Preformed by: Fonnie Jarvis, CMA  Follow up: 1 month w/Shannon McGowan, [facility] for visit and tube change

## 2017-06-16 ENCOUNTER — Encounter: Payer: Medicare HMO | Admitting: Surgery

## 2017-06-16 DIAGNOSIS — L89623 Pressure ulcer of left heel, stage 3: Secondary | ICD-10-CM | POA: Diagnosis not present

## 2017-06-22 NOTE — Progress Notes (Signed)
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 left heel is also doing better. 04/14/2017-- the patient had a total of 8 applications of [employer] to her right heel and the vendor samples are done. As far as her left heel goes we will check with the vendor to see if there are any samples available. 04/28/17 on evaluation today patient heels bilaterally appear to be doing okay although there is slough covering both wounds. She has continued to do about the same over several weeks when it comes to her bilateral heels. She is tolerating the dressing changes and has only minimal discomfort. 05/26/17 on evaluation today patient appears to be doing well in regard to her bilateral heal wounds. The right heel wound in particular is doing very well and is much smaller of the left heel wound is slowly progressing. EDLA, PARA ([id number]) She has been tolerating the dressing changes without complication. No fevers, chills, nausea, or vomiting noted at this time. 06/02/17 on evaluation today patient's wounds appeared to be doing about the same. She does not have  any significant overall improvement of her wounds at this point. They also do not appear to be significantly worse which is good news. She is having some discomfort in regard to the right lower extremity but this is minimal and only with cleansing of the wound. The left is nontender. No fevers, chills, nausea, or vomiting noted at this time. Objective Constitutional Pulse regular. Respirations normal and unlabored. Afebrile. Vitals Time Taken: 2:09 PM, Height: 63 in, Weight: 160 lbs, BMI: 28.3, Temperature: 98.6 F, Pulse: 63 bpm, Respiratory Rate: 16 breaths/min, Blood Pressure: 139/57 mmHg. Eyes Nonicteric. Reactive to light. Ears, Nose, Mouth, and Throat Lips, teeth, and gums WNL.Marland Kitchen Moist mucosa without lesions. Neck supple and nontender. No palpable supraclavicular or cervical adenopathy. Normal sized without goiter. Respiratory WNL. No retractions.. Cardiovascular Pedal Pulses WNL. No clubbing, cyanosis or edema. Lymphatic No adneopathy. No adenopathy. No adenopathy. Musculoskeletal Adexa without tenderness or enlargement.. Digits and nails w/o clubbing, cyanosis, infection, petechiae, ischemia, or inflammatory conditions.Marland Kitchen Psychiatric Judgement and insight Intact.. No evidence of depression, anxiety, or agitation.Marland Kitchen SIERAH, LACEWELL ([id number]) General Notes: both the wounds needed sharp debridement of the wound edges and the subcutaneous debris which was removed with a #3 curet and minimal bleeding controlled with pressure Integumentary (Hair, Skin) No suspicious lesions. No crepitus or fluctuance. No peri-wound warmth or erythema. No masses.. Wound #1 status is Open. Original cause of wound was Pressure Injury. The wound is located on the Right,Medial Calcaneus. The wound measures 0.5cm length x 0.7cm width x 0.3cm depth; 0.275cm^2 area and 0.082cm^3 volume. There is Fat Layer (Subcutaneous Tissue) Exposed exposed. There is no tunneling or undermining noted. There is a medium  amount of purulent drainage noted. The wound margin is flat and intact. There is medium (34-66%) pink, pale granulation within the wound bed. There is a medium (34-66%) amount of necrotic tissue within the wound bed including Adherent Slough. The periwound skin appearance exhibited: Induration, Dry/Scaly. The periwound skin appearance did not exhibit: Callus, Crepitus, Excoriation, Rash, Scarring, Maceration, Atrophie Blanche, Cyanosis, Ecchymosis, Hemosiderin  Staining, Mottled, Pallor, Rubor, Erythema. Periwound temperature was noted as No Abnormality. The periwound has tenderness on palpation. Wound #2 status is Open. Original cause of wound was Pressure Injury. The wound is located on the Left,Medial Calcaneus. The wound measures 3cm length x 2.5cm width x 0.3cm depth; 5.89cm^2 area and 1.767cm^3 volume. There is Fat Layer (Subcutaneous Tissue) Exposed exposed. There is no tunneling or undermining noted. There is a large amount of serosanguineous drainage noted. Foul odor after cleansing was noted. The wound margin is thickened. There is small (1-33%) red, pink granulation within the wound bed. There is a large (67-100%) amount of necrotic tissue within the wound bed including Adherent Slough. The periwound skin appearance exhibited: Maceration. The periwound skin appearance did not exhibit: Callus, Crepitus, Excoriation, Induration, Rash, Scarring, Dry/Scaly, Atrophie Blanche, Cyanosis, Ecchymosis, Hemosiderin Staining, Mottled, Pallor, Rubor, Erythema. Periwound temperature was noted as No Abnormality. The periwound has tenderness on palpation. 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 ([id number]) Pre-procedure diagnosis of Wound #1 is a Pressure Ulcer located on the Right,Medial  Calcaneus . There was a Skin/Subcutaneous Tissue Debridement ([id number]) debridement with total area of 0.35 sq cm performed by Christin Fudge, MD. with the following instrument(s): Curette to remove Viable and Non-Viable tissue/material including Fibrin/Slough, Callus, and Subcutaneous after achieving pain control using Other (liocaone 4%). A time out was conducted at 14:35, prior to the start of the procedure. A Moderate amount of bleeding was controlled with Pressure. The procedure was tolerated well with a pain level of 3 throughout and a pain level of 2 following the procedure. Post Debridement Measurements: 0.5cm length x 0.8cm width x 0.4cm depth; 0.126cm^3 volume. Post debridement Stage noted as Category/Stage III. Character of Wound/Ulcer Post Debridement is stable. Post procedure Diagnosis Wound #1: Same as Pre-Procedure Wound #2 Pre-procedure diagnosis of Wound #2 is a Pressure Ulcer located on the Left,Medial Calcaneus . There was a Skin/Subcutaneous Tissue Debridement ([id number]) debridement with total area of 7.5 sq cm performed by Christin Fudge, MD. with the following instrument(s): Curette to remove Viable and Non-Viable tissue/material including Exudate, Fibrin/Slough, Callus, and Subcutaneous after achieving pain control using Other (liocaone 4%). A time out was conducted at 14:35, prior to the start of the procedure. A Moderate amount of bleeding was controlled with Pressure. The procedure was tolerated well with a pain level of 3 throughout and a pain level of 2 following the procedure. Post Debridement Measurements: 3cm length x 2.5cm width x 0.4cm depth; 2.356cm^3 volume. Post debridement Stage noted as Category/Stage III. Character of Wound/Ulcer Post Debridement is stable. Post procedure Diagnosis Wound #2: Same as Pre-Procedure Plan Wound Cleansing: Wound #1 Right,Medial Calcaneus: Clean wound with Normal Saline. Wound #2 Left,Medial Calcaneus: Clean wound with  Normal Saline. Anesthetic: Wound #1 Right,Medial Calcaneus: Topical Lidocaine 4% cream applied to wound bed prior to debridement - In clinic only Wound #2 Left,Medial Calcaneus: Topical Lidocaine 4% cream applied to wound bed prior to debridement - In clinic only Primary Wound Dressing: Wound #1 Right,Medial Calcaneus: Other: - Polymem Ag Wound #2 Left,Medial Calcaneus: Other: - Polymem Ag Secondary Dressing: Wound #1 Right,Medial Calcaneus: ABD and Kerlix/Conform Dempster, Caraline S. ([id number]) Wound #2 Left,Medial Calcaneus: ABD and Kerlix/Conform Dressing Change Frequency: Wound #1 Right,Medial Calcaneus: Change dressing every other day. Wound #2 Left,Medial Calcaneus: Change dressing  every other day. Follow-up Appointments: Wound #1 Right,Medial Calcaneus: Return Appointment in 1 week. Wound #2 Left,Medial Calcaneus: Return Appointment in 1 week. Edema Control: Wound #1 Right,Medial Calcaneus: Elevate legs to the level of the heart and pump ankles as often as possible Wound #2 Left,Medial Calcaneus: Elevate legs to the level of the heart and pump ankles as often as possible Off-Loading: Wound #1 Right,Medial Calcaneus: Other: - No pressure on heels!!! Float heel while in chair or bed. Sage boots at night along with floating heels. Wound #2 Left,Medial Calcaneus: Other: - No pressure on heels!!! Float heel while in chair or bed. Sage boots at night along with floating heels. Additional Orders / Instructions: Wound #1 Right,Medial Calcaneus: Increase protein intake. Other: - Vitamins A, C and Zinc Wound #2 Left,Medial Calcaneus: Increase protein intake. Other: - Vitamins A, C and Zinc Home Health: Wound #1 Right,Medial Calcaneus: Continue Home Health Visits - [place] Nurse may visit PRN to address patient s wound care needs. FACE TO FACE ENCOUNTER: MEDICARE and MEDICAID PATIENTS: I certify that this patient is under my care and that I had a face-to-face  encounter that meets the physician face-to-face encounter requirements with this patient on this date. The encounter with the patient was in whole or in part for the following MEDICAL CONDITION: (primary reason for [place]) MEDICAL NECESSITY: I certify, that based on my findings, NURSING services are a medically necessary home health service. HOME BOUND STATUS: I certify that my clinical findings support that this patient is homebound (i.e., Due to illness or injury, pt requires aid of supportive devices such as crutches, cane, wheelchairs, walkers, the use of special transportation or the assistance of another person to leave their place of residence. There is a normal inability to leave the home and doing so requires considerable and taxing effort. Other absences are for medical reasons / religious services and are infrequent or of short duration when for other reasons). If current dressing causes regression in wound condition, may D/C ordered dressing product/s and apply Normal Saline Moist Dressing daily until next [place] / Other MD appointment. [place] of regression in wound condition at [phone]. Please direct any NON-WOUND related issues/requests for orders to patient's Primary Care Physician Wound #2 Left,Medial Calcaneus: [place] Visits - KAZANDRA, FORSTROM ([id number]) Home Health Nurse may visit PRN to address patient s wound care needs. FACE TO FACE ENCOUNTER: MEDICARE and MEDICAID PATIENTS: I certify that this patient is under my care and that I had a face-to-face encounter that meets the physician face-to-face encounter requirements with this patient on this date. The encounter with the patient was in whole or in part for the following MEDICAL CONDITION: (primary reason for [place]) MEDICAL NECESSITY: I certify, that based on my findings, NURSING services are a medically necessary home health service.  HOME BOUND STATUS: I certify that my clinical findings support that this patient is homebound (i.e., Due to illness or injury, pt requires aid of supportive devices such as crutches, cane, wheelchairs, walkers, the use of special transportation or the assistance of another person to leave their place of residence. There is a normal inability to leave the home and doing so requires considerable and taxing effort. Other absences are for medical reasons / religious services and are infrequent or of short duration when for other reasons). If current dressing causes regression in wound condition, may D/C ordered dressing product/s and apply Normal Saline Moist Dressing daily until  next [place] / Other MD appointment. [place] of regression in wound condition at [phone]. Please direct any NON-WOUND related issues/requests for orders to patient's Primary Care Physician after sharp debridement and review today I have recommended: 1. Polymem silver be applied to the right heel and left wound and this can be changed every other day with an appropriate offloading technique. 2. Continue with adequate protein, vitamin A, vitamin C and zinc 3. Regular visits to the wound center -- every week has been stressed Electronic Signature(s) Signed: 06/16/2017 4:28:32 PM By: Christin Fudge MD, FACS Entered By: Christin Fudge on 06/16/2017 15:55:15 [name] ([id number]) -------------------------------------------------------------------------------- SuperBill Details Patient Name: [name]. Date of Service: 06/16/2017 Medical Record Number: [id number] Patient Account Number: [id number] Date of Birth/Sex: [birth year] (83 y.o. 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 L89.322 Pressure ulcer of left buttock, stage 2 Facility Procedures CPT4 Code: [id number] Description: 11155 - DEB SUBQ TISSUE 20 SQ CM/< ICD-10 Description Diagnosis L89.623 Pressure ulcer of left heel, stage 3 L89.613 Pressure ulcer of right heel, stage 3 Z99.3 Dependence on wheelchair Modifier: Quantity: 1 Physician Procedures CPT4 Code: [id number] Description: 11042 - WC PHYS SUBQ TISS 20 SQ CM ICD-10 Description Diagnosis L89.623 Pressure ulcer of left heel, stage 3 L89.613 Pressure ulcer of right heel, stage 3 Z99.3 Dependence on wheelchair Modifier: Quantity: 1 Electronic Signature(s) Signed: 06/16/2017 4:28:32 PM By: Christin Fudge MD, FACS Entered By: Christin Fudge on 06/16/2017 14:54:43

## 2017-06-22 NOTE — Progress Notes (Signed)
[name], [name] ([id number]) Visit Report for 06/16/2017 Arrival Information Details Patient Name: REYANNE, HUSSAR. Date of Service: 06/16/2017 2:00 PM Medical Record Number: [id number] Patient Account Number: [id number] Date of Birth/Sex: [birth year] (83 y.o. Female) Treating RN: Cornell Barman Primary Care Torrance Frech: [name] Other Clinician: Referring Rontavious Albright: [name] Treating Laretha Luepke/Extender: Frann Rider in Treatment: 68 Visit Information History Since Last Visit Added or deleted any medications: No Patient Arrived: Wheel Chair Any new allergies or adverse reactions: No Arrival Time: 14:09 Had a fall or experienced change in No activities of daily living that may affect Accompanied By: self risk of falls: Transfer Assistance: [employer] Signs or symptoms of abuse/neglect since last No Patient Identification Verified: Yes visito Secondary Verification Process Yes Hospitalized since last visit: No Completed: Has Dressing in Place as Prescribed: Yes Patient Requires Transmission-Based No Pain Present Now: No Precautions: Patient Has Alerts: No Electronic Signature(s) Signed: 06/21/2017 5:20:16 PM By: Gretta Cool, BSN, RN, CWS, Kim RN, BSN Entered By: Gretta Cool, BSN, RN, CWS, Kim on 06/16/2017 14:09:46 [name] ([id number]) -------------------------------------------------------------------------------- Encounter Discharge Information Details Patient Name: [name]. Date of Service: 06/16/2017 2:00 PM Medical Record Number: [id number] Patient Account Number: [id number] Date of Birth/Sex: 09-21-1931 (83 y.o. Female) Treating RN: Cornell Barman Primary Care Hendy Brindle: [name] Other Clinician: Referring Taletha Twiford: [name] Treating Lashann Hagg/Extender: Frann Rider in Treatment: 21 Encounter Discharge Information Items Discharge Pain Level: 0 Discharge Condition: Stable Ambulatory Status: Wheelchair Discharge Destination:  Home Transportation: Private Auto Accompanied By: self Schedule Follow-up Appointment: Yes Medication Reconciliation completed and provided to Patient/Care Yes Maryjo Ragon: Provided on Clinical Summary of Care: 06/16/2017 Form Type Recipient Paper Patient EB Electronic Signature(s) Signed: 06/21/2017 5:20:16 PM By: Gretta Cool, BSN, RN, CWS, Kim RN, BSN Entered By: Gretta Cool, BSN, RN, CWS, Kim on 06/16/2017 15:00:29 [name] ([id number]) -------------------------------------------------------------------------------- Lower Extremity Assessment Details Patient Name: [name]. Date of Service: 06/16/2017 2:00 PM Medical Record Number: [id number] Patient Account Number: [id number] Date of Birth/Sex: 1931-10-18 (83 y.o. Female) Treating RN: Cornell Barman Primary Care Kimmie Doren: [name] Other Clinician: Referring Kelly Eisler: [name] Treating Arrabella Westerman/Extender: Frann Rider in Treatment: 40 Edema Assessment Assessed: [Left: No] [Right: No] Edema: [Left: No] [Right: No] Vascular Assessment Pulses: Dorsalis Pedis Palpable: [Left:Yes] [Right:Yes] Posterior Tibial Extremity colors, hair growth, and conditions: Extremity Color: [Left:Mottled] [Right:Mottled] Hair Growth on Extremity: [Left:No] [Right:No] Temperature of Extremity: [Left:Warm] [Right:Warm] Capillary Refill: [Left:< 3 seconds] [Right:< 3 seconds] Dependent Rubor: [Left:No] [Right:No] Blanched when Elevated: [Left:No] [Right:No] Lipodermatosclerosis: [Left:No] [Right:No] Toe Nail Assessment Left: Right: Thick: Yes Yes Discolored: Yes Yes Deformed: Yes Yes Improper Length and Hygiene: Yes Yes Notes Patient has an appointment in September to get her nails trimmed. Electronic Signature(s) Signed: 06/21/2017 5:20:16 PM By: Gretta Cool, BSN, RN, CWS, Kim RN, BSN Entered By: Gretta Cool, BSN, RN, CWS, Kim on 06/16/2017 14:26:52 [name]  ([id number]) -------------------------------------------------------------------------------- Multi Wound Chart Details Patient Name: [name] Date of Service: 06/16/2017 2:00 PM Medical Record Number: [id number] Patient Account Number: [id number] Date of Birth/Sex: May 09, 1931 (83 y.o. Female) Treating RN: Cornell Barman Primary Care Amaura Authier: [name] Other Clinician: Referring Aliyyah Riese: [name] Treating Darrol Brandenburg/Extender: Frann Rider in Treatment: 40 Vital Signs Height(in): 63 Pulse(bpm): 63 Weight(lbs): 160 Blood Pressure 139/57 (mmHg): Body Mass Index(BMI): 28 Temperature(F): 98.6 Respiratory Rate 16 (breaths/min): Photos: [1:No Photos] [2:No Photos] [N/A:N/A] Wound Location: [1:Right Calcaneus - Medial] [2:Left Calcaneus - Medial] [N/A:N/A] Wounding Event: [1:Pressure Injury] [2:Pressure Injury] [N/A:N/A] Primary Etiology: [1:Pressure Ulcer] [2:Pressure Ulcer] [N/A:N/A] Comorbid  History: [1:Cataracts, Hypertension] [2:Cataracts, Hypertension] [N/A:N/A] Date Acquired: [1:07/02/2016] [2:07/02/2016] [N/A:N/A] Weeks of Treatment: [1:40] [2:40] [N/A:N/A] Wound Status: [1:Open] [2:Open] [N/A:N/A] Measurements L x W x D 0.5x0.7x0.3 [2:3x2.5x0.3] [N/A:N/A] (cm) Area (cm) : [1:0.275] [2:5.89] [N/A:N/A] Volume (cm) : [1:0.082] [2:1.767] [N/A:N/A] % Reduction in Area: [1:97.80%] [2:70.20%] [N/A:N/A] % Reduction in Volume: 93.40% [2:10.70%] [N/A:N/A] Classification: [1:Category/Stage III] [2:Category/Stage III] [N/A:N/A] Exudate Amount: [1:Medium] [2:Large] [N/A:N/A] Exudate Type: [1:Purulent] [2:Serosanguineous] [N/A:N/A] Exudate Color: [1:yellow, brown, green] [2:red, brown] [N/A:N/A] Foul Odor After [1:No] [2:Yes] [N/A:N/A] Cleansing: Odor Anticipated Due to N/A [2:No] [N/A:N/A] Product Use: Wound Margin: [1:Flat and Intact] [2:Thickened] [N/A:N/A] Granulation Amount: [1:Medium (34-66%)] [2:Small (1-33%)] [N/A:N/A] Granulation Quality: [1:Pink,  Pale] [2:Red, Pink] [N/A:N/A] Necrotic Amount: [1:Medium (34-66%)] [2:Large (67-100%)] [N/A:N/A] Exposed Structures: [1:Fat Layer (Subcutaneous Tissue) Exposed: Yes Fascia: No Tendon: No] [2:Fat Layer (Subcutaneous Tissue) Exposed: Yes Fascia: No Tendon: No] [N/A:N/A] Muscle: No Muscle: No Joint: No Joint: No Bone: No Bone: No Epithelialization: Medium (34-66%) Small (1-33%) N/A Debridement: Debridement (13086- Debridement (57846- N/A 11047) 11047) Pre-procedure 14:35 14:35 N/A Verification/Time Out Taken: Pain Control: Other Other N/A Tissue Debrided: Fibrin/Slough, Callus, Fibrin/Slough, Exudates, N/A Subcutaneous Callus, Subcutaneous Level: Skin/Subcutaneous Skin/Subcutaneous N/A Tissue Tissue Debridement Area (sq 0.35 7.5 N/A cm): Instrument: Curette Curette N/A Bleeding: Moderate Moderate N/A Hemostasis Achieved: Pressure Pressure N/A Procedural Pain: 3 3 N/A Post Procedural Pain: 2 2 N/A Debridement Treatment Procedure was tolerated Procedure was tolerated N/A Response: well well Post Debridement 0.5x0.8x0.4 3x2.5x0.4 N/A Measurements L x W x D (cm) Post Debridement 0.126 2.356 N/A Volume: (cm) Post Debridement Category/Stage III Category/Stage III N/A Stage: Periwound Skin Texture: Induration: Yes Excoriation: No N/A Excoriation: No Induration: No Callus: No Callus: No Crepitus: No Crepitus: No Rash: No Rash: No Scarring: No Scarring: No Periwound Skin Dry/Scaly: Yes Maceration: Yes N/A Moisture: Maceration: No Dry/Scaly: No Periwound Skin Color: Atrophie Blanche: No Atrophie Blanche: No N/A Cyanosis: No Cyanosis: No Ecchymosis: No Ecchymosis: No Erythema: No Erythema: No Hemosiderin Staining: No Hemosiderin Staining: No Mottled: No Mottled: No Pallor: No Pallor: No Rubor: No Rubor: No Temperature: No Abnormality No Abnormality N/A Tenderness on Yes Yes N/A Palpation: Wound Preparation: N/A DELIAH, STREHLOW ([id number]) Ulcer Cleansing:  Ulcer Cleansing: Rinsed/Irrigated with Rinsed/Irrigated with Saline Saline Topical Anesthetic Topical Anesthetic Applied: Other: lidocaine Applied: Other: lidocaine 4% 4% Procedures Performed: Debridement Debridement N/A Treatment Notes Electronic Signature(s) Signed: 06/16/2017 4:28:32 PM By: Christin Fudge MD, FACS Entered By: Christin Fudge on 06/16/2017 14:51:54 [name] ([id number]) -------------------------------------------------------------------------------- [place] Details Patient Name: [name] Date of Service: 06/16/2017 2:00 PM Medical Record Number: [id number] Patient Account Number: [id number] Date of Birth/Sex: [birth year] (83 y.o. Female) Treating RN: Cornell Barman Primary Care Auri Jahnke: [name] Other Clinician: Referring Tali Cleaves: [name] Treating Doretta Remmert/Extender: Frann Rider in Treatment: 40 Active Inactive ` Abuse / Safety / Falls / Self Care Management Nursing Diagnoses: Impaired physical mobility Potential for falls Goals: Patient will remain injury free Date Initiated: 09/06/2016 Target Resolution Date: 12/02/2016 Goal Status: Active Interventions: Assess fall risk on admission and as needed Assess self care needs on admission and as needed Notes: ` Necrotic Tissue Nursing Diagnoses: Impaired tissue integrity related to necrotic/devitalized tissue Goals: Necrotic/devitalized tissue will be minimized in the wound bed Date Initiated: 09/06/2016 Target Resolution Date: 12/02/2016 Goal Status: Active Interventions: Assess patient pain level pre-, during and post procedure and prior to discharge Notes: ` Orientation to the Wound Care Program Nursing Diagnoses: GIRTHA, KILGORE ([id number]) Knowledge deficit related to the wound  healing center program Goals: Patient/caregiver will verbalize understanding of the [place] Date Initiated: 09/06/2016 Target Resolution Date:  12/02/2016 Goal Status: Active Interventions: Provide education on orientation to the wound center Notes: ` Pressure Nursing Diagnoses: Knowledge deficit related to management of pressures ulcers Potential for impaired tissue integrity related to pressure, friction, moisture, and shear Goals: Patient will remain free of pressure ulcers Date Initiated: 09/06/2016 Target Resolution Date: 12/02/2016 Goal Status: Active Interventions: Assess: immobility, friction, shearing, incontinence upon admission and as needed Notes: ` Soft Tissue Infection Nursing Diagnoses: Impaired tissue integrity Potential for infection: soft tissue Goals: Patient will remain free of wound infection Date Initiated: 09/06/2016 Target Resolution Date: 12/02/2016 Goal Status: Active Interventions: Assess signs and symptoms of infection every visit Notes: ` Wound/Skin Impairment AVEAH, CASTELL ([id number]) Nursing Diagnoses: Impaired tissue integrity Goals: Ulcer/skin breakdown will heal within 14 weeks Date Initiated: 09/06/2016 Target Resolution Date: 12/16/2016 Goal Status: Active Interventions: Assess ulceration(s) every visit Notes: Electronic Signature(s) Signed: 06/21/2017 5:20:16 PM By: Gretta Cool, BSN, RN, CWS, Kim RN, BSN Entered By: Gretta Cool, BSN, RN, CWS, Kim on 06/16/2017 14:37:01 [name] ([id number]) -------------------------------------------------------------------------------- Pain Assessment Details Patient Name: [name]. Date of Service: 06/16/2017 2:00 PM Medical Record Number: [id number] Patient Account Number: [id number] Date of Birth/Sex: September 29, 1931 (83 y.o. Female) Treating RN: Cornell Barman Primary Care Deina Lipsey: [name] Other Clinician: Referring Ayansh Feutz: [name] Treating Brenden Rudman/Extender: Frann Rider in Treatment: 40 Active Problems Location of Pain Severity and Description of Pain Patient Has Paino No Site Locations With Dressing  Change: No Pain Management and Medication Current Pain Management: Electronic Signature(s) Signed: 06/21/2017 5:20:16 PM By: Gretta Cool, BSN, RN, CWS, Kim RN, BSN Entered By: Gretta Cool, BSN, RN, CWS, Kim on 06/16/2017 14:09:54 [name] ([id number]) -------------------------------------------------------------------------------- Patient/Caregiver Education Details Patient Name: [name] Date of Service: 06/16/2017 2:00 PM Medical Record Number: [id number] Patient Account Number: [id number] Date of Birth/Gender: [birth year] (83 y.o. Female) Treating RN: Cornell Barman Primary Care Physician: [name] Other Clinician: Referring Physician: [name] Treating Physician/Extender: Frann Rider in Treatment: 100 Education Assessment Education Provided To: Patient Education Topics Provided Wound/Skin Impairment: Handouts: Caring for Your Ulcer, Other: [place] orders to [facility] Methods: Demonstration Responses: State content correctly [employer]) Signed: 06/21/2017 5:20:16 PM By: Gretta Cool, BSN, RN, CWS, Kim RN, BSN Entered By: Gretta Cool, BSN, RN, CWS, Kim on 06/16/2017 15:01:02 [name] ([id number]) -------------------------------------------------------------------------------- Wound Assessment Details Patient Name: [name]. Date of Service: 06/16/2017 2:00 PM Medical Record Number: [id number] Patient Account Number: [id number] Date of Birth/Sex: [birth year] (83 y.o. Female) Treating RN: Cornell Barman Primary Care Dahmir Epperly: [name] Other Clinician: Referring Orvell Careaga: [name] Treating Tais Koestner/Extender: Frann Rider in Treatment: 40 Wound Status Wound Number: 1 Primary Etiology: Pressure Ulcer Wound Location: Right Calcaneus - Medial Wound Status: Open Wounding Event: Pressure Injury Comorbid History: Cataracts, Hypertension Date Acquired: 07/02/2016 Weeks Of Treatment: 40 Clustered Wound: No Photos Photo Uploaded By: Gretta Cool,  BSN, RN, CWS, Kim on 06/21/2017 09:45:25 Wound Measurements Length: (cm) 0.5 Width: (cm) 0.7 Depth: (cm) 0.3 Area: (cm) 0.275 Volume: (cm) 0.082 % Reduction in Area: 97.8% % Reduction in Volume: 93.4% Epithelialization: Medium (34-66%) Tunneling: No Undermining: No Wound Description Classification: Category/Stage III Wound Margin: Flat and Intact Exudate Amount: Medium Exudate Type: Purulent Exudate Color: yellow, brown, green Foul Odor After Cleansing: No Slough/Fibrino Yes Wound Bed Granulation Amount: Medium (34-66%) Exposed Structure Granulation Quality: Pink, Pale Fascia Exposed: No Necrotic Amount: Medium (34-66%) Fat Layer (Subcutaneous Tissue) Exposed:  Yes Necrotic Quality: Adherent Slough Tendon Exposed: No Muscle Exposed: No Joint Exposed: No Bone Exposed: No Elliff, Hadeel S. ([id number]) Periwound Skin Texture Texture Color No Abnormalities Noted: No No Abnormalities Noted: No Callus: No Atrophie Blanche: No Crepitus: No Cyanosis: No Excoriation: No Ecchymosis: No Induration: Yes Erythema: No Rash: No Hemosiderin Staining: No Scarring: No Mottled: No Pallor: No Moisture Rubor: No No Abnormalities Noted: No Dry / Scaly: Yes Temperature / Pain Maceration: No Temperature: No Abnormality Tenderness on Palpation: Yes Wound Preparation Ulcer Cleansing: Rinsed/Irrigated with Saline Topical Anesthetic Applied: Other: lidocaine 4%, Treatment Notes Wound #1 (Right, Medial Calcaneus) 1. Cleansed with: Clean wound with Normal Saline 2. Anesthetic Topical Lidocaine 4% cream to wound bed prior to debridement 4. Dressing Applied: Other dressing (specify in notes) 5. Secondary Dressing Applied ABD and Kerlix/Conform Notes [employer] Electronic Signature(s) Signed: 06/21/2017 5:20:16 PM By: Gretta Cool, BSN, RN, CWS, Kim RN, BSN Entered By: Gretta Cool, BSN, RN, CWS, Kim on 06/16/2017 14:24:57 [name]  ([id number]) -------------------------------------------------------------------------------- Wound Assessment Details Patient Name: [name] Date of Service: 06/16/2017 2:00 PM Medical Record Number: [id number] Patient Account Number: [id number] Date of Birth/Sex: 1931/05/12 (83 y.o. Female) Treating RN: Cornell Barman Primary Care Trany Chernick: [name] Other Clinician: Referring Finley Chevez: [name] Treating Corbyn Wildey/Extender: Frann Rider in Treatment: 40 Wound Status Wound Number: 2 Primary Etiology: Pressure Ulcer Wound Location: Left Calcaneus - Medial Wound Status: Open Wounding Event: Pressure Injury Comorbid History: Cataracts, Hypertension Date Acquired: 07/02/2016 Weeks Of Treatment: 40 Clustered Wound: No Photos Photo Uploaded By: Gretta Cool, BSN, RN, CWS, Kim on 06/21/2017 09:45:25 Wound Measurements Length: (cm) 3 Width: (cm) 2.5 Depth: (cm) 0.3 Area: (cm) 5.89 Volume: (cm) 1.767 % Reduction in Area: 70.2% % Reduction in Volume: 10.7% Epithelialization: Small (1-33%) Tunneling: No Undermining: No Wound Description Classification: Category/Stage III Wound Margin: Thickened Exudate Amount: Large Exudate Type: Serosanguineous Exudate Color: red, brown Foul Odor After Cleansing: Yes Due to Product Use: No Slough/Fibrino Yes Wound Bed Granulation Amount: Small (1-33%) Exposed Structure Granulation Quality: Red, Pink Fascia Exposed: No Necrotic Amount: Large (67-100%) Fat Layer (Subcutaneous Tissue) Exposed: Yes Necrotic Quality: Adherent Slough Tendon Exposed: No Muscle Exposed: No Joint Exposed: No Bone Exposed: No Leyba, Alizaya S. ([id number]) Periwound Skin Texture Texture Color No Abnormalities Noted: No No Abnormalities Noted: No Callus: No Atrophie Blanche: No Crepitus: No Cyanosis: No Excoriation: No Ecchymosis: No Induration: No Erythema: No Rash: No Hemosiderin Staining: No Scarring: No Mottled: No Pallor:  No Moisture Rubor: No No Abnormalities Noted: No Dry / Scaly: No Temperature / Pain Maceration: Yes Temperature: No Abnormality Tenderness on Palpation: Yes Wound Preparation Ulcer Cleansing: Rinsed/Irrigated with Saline Topical Anesthetic Applied: Other: lidocaine 4%, Treatment Notes Wound #2 (Left, Medial Calcaneus) 1. Cleansed with: Clean wound with Normal Saline 2. Anesthetic Topical Lidocaine 4% cream to wound bed prior to debridement 4. Dressing Applied: Other dressing (specify in notes) 5. Secondary Dressing Applied ABD and Kerlix/Conform Notes [employer] Electronic Signature(s) Signed: 06/21/2017 5:20:16 PM By: Gretta Cool, BSN, RN, CWS, Kim RN, BSN Entered By: Gretta Cool, BSN, RN, CWS, Kim on 06/16/2017 14:25:37 [name] ([id number]) -------------------------------------------------------------------------------- [place] Details Patient Name: [name] Date of Service: 06/16/2017 2:00 PM Medical Record Number: [id number] Patient Account Number: [id number] Date of Birth/Sex: May 22, 1931 (83 y.o. Female) Treating RN: Cornell Barman Primary Care Tulio Facundo: [name] Other Clinician: Referring Krishawn Vanderweele: [name] Treating Chole Driver/Extender: Frann Rider in Treatment: 40 Vital Signs Time Taken: 14:09 Temperature (F): 98.6 Height (in): 63 Pulse (bpm): 63 Weight (  lbs): 160 Respiratory Rate (breaths/min): 16 Body Mass Index (BMI): 28.3 Blood Pressure (mmHg): 139/57 Reference Range: 80 - 120 mg / dl Electronic Signature(s) Signed: 06/21/2017 5:20:16 PM By: Gretta Cool, BSN, RN, CWS, Kim RN, BSN Entered By: Gretta Cool, BSN, RN, CWS, Kim on 06/16/2017 14:10:16

## 2017-06-23 ENCOUNTER — Encounter: Payer: Medicare HMO | Attending: Surgery | Admitting: Surgery

## 2017-06-23 DIAGNOSIS — I129 Hypertensive chronic kidney disease with stage 1 through stage 4 chronic kidney disease, or unspecified chronic kidney disease: Secondary | ICD-10-CM | POA: Insufficient documentation

## 2017-06-23 DIAGNOSIS — L89613 Pressure ulcer of right heel, stage 3: Secondary | ICD-10-CM | POA: Insufficient documentation

## 2017-06-23 DIAGNOSIS — N189 Chronic kidney disease, unspecified: Secondary | ICD-10-CM | POA: Insufficient documentation

## 2017-06-23 DIAGNOSIS — D649 Anemia, unspecified: Secondary | ICD-10-CM | POA: Insufficient documentation

## 2017-06-23 DIAGNOSIS — Z993 Dependence on wheelchair: Secondary | ICD-10-CM | POA: Insufficient documentation

## 2017-06-23 DIAGNOSIS — L89623 Pressure ulcer of left heel, stage 3: Secondary | ICD-10-CM | POA: Diagnosis present

## 2017-06-25 NOTE — Progress Notes (Signed)
XIA, STOHR ([id number]) Visit Report for 06/23/2017 Arrival Information Details Patient Name: [name], [name]. Date of Service: 06/23/2017 2:00 PM Medical Record Number: [id number] Patient Account Number: [id number] Date of Birth/Sex: 16-Oct-1931 (83 y.o. Female) Treating RN: Cornell Barman Primary Care Trinaty Bundrick: [name] Other Clinician: Referring Jace Dowe: [name] Treating Hana Trippett/Extender: Frann Rider in Treatment: 49 Visit Information History Since Last Visit Added or deleted any medications: No Patient Arrived: Wheel Chair Any new allergies or adverse reactions: No Arrival Time: 13:59 Had a fall or experienced change in No activities of daily living that may affect Accompanied By: self risk of falls: Transfer Assistance: [employer] fast streamer Signs or symptoms of abuse/neglect since last No Patient Identification Verified: Yes visito Secondary Verification Process Yes Hospitalized since last visit: No Completed: Has Dressing in Place as Prescribed: Yes Patient Requires Transmission-Based No Pain Present Now: No Precautions: Patient Has Alerts: No Electronic Signature(s) Signed: 06/23/2017 4:53:44 PM By: Gretta Cool, BSN, RN, CWS, Kim RN, BSN Entered By: Gretta Cool, BSN, RN, CWS, Kim on 06/23/2017 14:03:23 [name] ([id number]) -------------------------------------------------------------------------------- Clinic Level of Care Assessment Details Patient Name: [name]. Date of Service: 06/23/2017 2:00 PM Medical Record Number: [id number] Patient Account Number: [id number] Date of Birth/Sex: 12-15-1930 (83 y.o. Female) Treating RN: Cornell Barman Primary Care Colyn Miron: [name] Other Clinician: Referring Satoshi Kalas: [name] Treating Aradhya Shellenbarger/Extender: Frann Rider in Treatment: 66 Clinic Level of Care Assessment Items TOOL 4 Quantity Score []  - Use when only an EandM is performed on FOLLOW-UP visit 0 ASSESSMENTS - Nursing Assessment /  Reassessment []  - Reassessment of Co-morbidities (includes updates in patient status) 0 X - Reassessment of Adherence to Treatment Plan 1 5 ASSESSMENTS - Wound and Skin Assessment / Reassessment []  - Simple Wound Assessment / Reassessment - one wound 0 X - Complex Wound Assessment / Reassessment - multiple wounds 2 5 []  - Dermatologic / Skin Assessment (not related to wound area) 0 ASSESSMENTS - Focused Assessment []  - Circumferential Edema Measurements - multi extremities 0 []  - Nutritional Assessment / Counseling / Intervention 0 []  - Lower Extremity Assessment (monofilament, tuning fork, pulses) 0 []  - Peripheral Arterial Disease Assessment (using hand held doppler) 0 ASSESSMENTS - Ostomy and/or Continence Assessment and Care []  - Incontinence Assessment and Management 0 []  - Ostomy Care Assessment and Management (repouching, etc.) 0 PROCESS - Coordination of Care X - Simple Patient / Family Education for ongoing care 1 15 []  - Complex (extensive) Patient / Family Education for ongoing care 0 X - Staff obtains Programmer, systems, Records, Test Results / Process Orders 1 10 []  - Staff telephones HHA, Nursing Homes / Clarify orders / etc 0 []  - Routine Transfer to another Facility (non-emergent condition) 0 [name], [name] ([id number]) []  - Routine Hospital Admission (non-emergent condition) 0 []  - New Admissions / Biomedical engineer / Ordering NPWT, Apligraf, etc. 0 []  - Emergency Hospital Admission (emergent condition) 0 X - Simple Discharge Coordination 1 10 []  - Complex (extensive) Discharge Coordination 0 PROCESS - Special Needs []  - Pediatric / Minor Patient Management 0 []  - Isolation Patient Management 0 []  - Hearing / Language / Visual special needs 0 []  - Assessment of Community assistance (transportation, D/C planning, etc.) 0 []  - Additional assistance / Altered mentation 0 []  - Support Surface(s) Assessment (bed, cushion, seat, etc.) 0 INTERVENTIONS - Wound Cleansing /  Measurement []  - Simple Wound Cleansing - one wound 0 X - Complex Wound Cleansing - multiple wounds 2 5 X - Wound Imaging (photographs -  any number of wounds) 1 5 []  - Wound Tracing (instead of photographs) 0 []  - Simple Wound Measurement - one wound 0 X - Complex Wound Measurement - multiple wounds 2 5 INTERVENTIONS - Wound Dressings []  - Small Wound Dressing one or multiple wounds 0 X - Medium Wound Dressing one or multiple wounds 2 15 []  - Large Wound Dressing one or multiple wounds 0 []  - Application of Medications - topical 0 []  - Application of Medications - injection 0 INTERVENTIONS - Miscellaneous []  - External ear exam 0 Agrawal, Ranita S. ([id number]) []  - Specimen Collection (cultures, biopsies, blood, body fluids, etc.) 0 []  - Specimen(s) / Culture(s) sent or taken to Lab for analysis 0 []  - Patient Transfer (multiple staff / Harrel Lemon Lift / Similar devices) 0 []  - Simple Staple / Suture removal (25 or less) 0 []  - Complex Staple / Suture removal (26 or more) 0 []  - Hypo / Hyperglycemic Management (close monitor of Blood Glucose) 0 []  - Ankle / Brachial Index (ABI) - do not check if billed separately 0 X - Vital Signs 1 5 Has the patient been seen at the hospital within the last three years: Yes Total Score: 110 Level Of Care: New/Established - Level 3 Electronic Signature(s) Signed: 06/24/2017 4:54:08 PM By: Gretta Cool, BSN, RN, CWS, Kim RN, BSN Entered By: Gretta Cool, BSN, RN, CWS, Kim on 06/24/2017 11:20:42 [name] ([id number]) -------------------------------------------------------------------------------- Encounter Discharge Information Details Patient Name: [name]. Date of Service: 06/23/2017 2:00 PM Medical Record Number: [id number] Patient Account Number: [id number] Date of Birth/Sex: 1931-07-28 (83 y.o. Female) Treating RN: Cornell Barman Primary Care Klein Willcox: [name] Other Clinician: Referring Dalyah Pla: [name] Treating Cicely Ortner/Extender: Frann Rider in Treatment: 80 Encounter Discharge Information Items Schedule Follow-up Appointment: No Medication Reconciliation completed No and provided to Patient/Care Cleaster Shiffer: Provided on Clinical Summary of Care: 06/23/2017 Form Type Recipient Paper Patient EB Electronic Signature(s) Signed: 06/24/2017 10:11:21 AM By: Ruthine Dose Entered By: Ruthine Dose on 06/23/2017 14:39:50 [name] ([id number]) -------------------------------------------------------------------------------- Lower Extremity Assessment Details Patient Name: [name]. Date of Service: 06/23/2017 2:00 PM Medical Record Number: [id number] Patient Account Number: [id number] Date of Birth/Sex: Mar 03, 1931 (83 y.o. Female) Treating RN: Cornell Barman Primary Care Tayllor Breitenstein: [name] Other Clinician: Referring Shar Paez: [name] Treating Graviela Nodal/Extender: Frann Rider in Treatment: 41 Vascular Assessment Pulses: Dorsalis Pedis Palpable: [Left:Yes] [Right:Yes] Posterior Tibial Extremity colors, hair growth, and conditions: Extremity Color: [Left:Hyperpigmented] [Right:Hyperpigmented] Hair Growth on Extremity: [Left:No] [Right:No] Temperature of Extremity: [Left:Warm] [Right:Warm] Capillary Refill: [Left:> 3 seconds] [Right:< 3 seconds] Dependent Rubor: [Left:No] [Right:No] Blanched when Elevated: [Left:No] [Right:No] Lipodermatosclerosis: [Left:No] [Right:No] Toe Nail Assessment Left: Right: Thick: Yes Yes Discolored: Yes Yes Deformed: Yes Yes Improper Length and Hygiene: Yes Yes Notes Patient has an upcoming appointment in September to get her nails trimmed. Electronic Signature(s) Signed: 06/23/2017 4:53:44 PM By: Gretta Cool, BSN, RN, CWS, Kim RN, BSN Entered By: Gretta Cool, BSN, RN, CWS, Kim on 06/23/2017 14:16:29 [name] ([id number]) -------------------------------------------------------------------------------- Multi Wound Chart Details Patient Name: [name] Date of Service: 06/23/2017 2:00 PM Medical Record Number: [id number] Patient Account Number: [id number] Date of Birth/Sex: 06/07/1931 (83 y.o. Female) Treating RN: Cornell Barman Primary Care Tamari Redwine: [name] Other Clinician: Referring Kathrina Crosley: [name] Treating Arasely Akkerman/Extender: Frann Rider in Treatment: 41 Vital Signs Height(in): 63 Pulse(bpm): 62 Weight(lbs): 160 Blood Pressure 143/57 (mmHg): Body Mass Index(BMI): 28 Temperature(F): 98.4 Respiratory Rate 16 (breaths/min): Photos: [N/A:N/A] Wound Location: Right Calcaneus - Medial Left Calcaneus -  Medial N/A Wounding Event: Pressure Injury Pressure Injury N/A Primary Etiology: Pressure Ulcer Pressure Ulcer N/A Comorbid History: Cataracts, Hypertension Cataracts, Hypertension N/A Date Acquired: 07/02/2016 07/02/2016 N/A Weeks of Treatment: 41 41 N/A Wound Status: Open Open N/A Measurements L x W x D 0.2x0.4x0.2 3x2.5x0.3 N/A (cm) Area (cm) : 0.063 5.89 N/A Volume (cm) : 0.013 1.767 N/A % Reduction in Area: 99.50% 70.20% N/A % Reduction in Volume: 98.90% 10.70% N/A Classification: Category/Stage III Category/Stage III N/A Exudate Amount: None Present Large N/A Exudate Type: N/A Serosanguineous N/A Exudate Color: N/A red, brown N/A Foul Odor After No Yes N/A Cleansing: Odor Anticipated Due to N/A No N/A Product Use: Wound Margin: Flat and Intact Thickened N/A Granulation Amount: Medium (34-66%) Medium (34-66%) N/A Granulation Quality: Pale Red, Pink N/A LAURAMAE, KNEISLEY. ([id number]) Necrotic Amount: Medium (34-66%) Medium (34-66%) N/A Exposed Structures: Fat Layer (Subcutaneous Fat Layer (Subcutaneous N/A Tissue) Exposed: Yes Tissue) Exposed: Yes Fascia: No Fascia: No Tendon: No Tendon: No Muscle: No Muscle: No Joint: No Joint: No Bone: No Bone: No Epithelialization: Large (67-100%) Small (1-33%) N/A Periwound Skin Texture: Induration: Yes Excoriation: No N/A Scarring:  Yes Induration: No Excoriation: No Callus: No Callus: No Crepitus: No Crepitus: No Rash: No Rash: No Scarring: No Periwound Skin Dry/Scaly: Yes Maceration: Yes N/A Moisture: Maceration: No Dry/Scaly: No Periwound Skin Color: Atrophie Blanche: No Atrophie Blanche: No N/A Cyanosis: No Cyanosis: No Ecchymosis: No Ecchymosis: No Erythema: No Erythema: No Hemosiderin Staining: No Hemosiderin Staining: No Mottled: No Mottled: No Pallor: No Pallor: No Rubor: No Rubor: No Temperature: No Abnormality No Abnormality N/A Tenderness on Yes Yes N/A Palpation: Wound Preparation: Ulcer Cleansing: Ulcer Cleansing: N/A Rinsed/Irrigated with Rinsed/Irrigated with Saline Saline Topical Anesthetic Topical Anesthetic Applied: Other: lidocaine Applied: Other: lidocaine 4% 4% Treatment Notes Electronic Signature(s) Signed: 06/23/2017 4:09:31 PM By: Christin Fudge MD, FACS Entered By: Christin Fudge on 06/23/2017 14:31:45 [name] ([id number]) -------------------------------------------------------------------------------- [place]in Details Patient Name: [name] Date of Service: 06/23/2017 2:00 PM Medical Record Number: [id number] Patient Account Number: [id number] Date of Birth/Sex: Aug 18, 1931 (83 y.o. Female) Treating RN: Cornell Barman Primary Care Justus Droke: [name] Other Clinician: Referring Messiah Rovira: [name] Treating Shirl Ludington/Extender: Frann Rider in Treatment: 17 Active Inactive ` Abuse / Safety / Falls / Self Care Management Nursing Diagnoses: Impaired physical mobility Potential for falls Goals: Patient will remain injury free Date Initiated: 09/06/2016 Target Resolution Date: 12/02/2016 Goal Status: Active Interventions: Assess fall risk on admission and as needed Assess self care needs on admission and as needed Notes: ` Necrotic Tissue Nursing Diagnoses: Impaired tissue integrity related to  necrotic/devitalized tissue Goals: Necrotic/devitalized tissue will be minimized in the wound bed Date Initiated: 09/06/2016 Target Resolution Date: 12/02/2016 Goal Status: Active Interventions: Assess patient pain level pre-, during and post procedure and prior to discharge Notes: ` Orientation to the Wound Care Program Nursing Diagnoses: DEMI, TRIEU ([id number]) Knowledge deficit related to the wound healing center program Goals: Patient/caregiver will verbalize understanding of the Reserve Program Date Initiated: 09/06/2016 Target Resolution Date: 12/02/2016 Goal Status: Active Interventions: Provide education on orientation to the wound center Notes: ` Pressure Nursing Diagnoses: Knowledge deficit related to management of pressures ulcers Potential for impaired tissue integrity related to pressure, friction, moisture, and shear Goals: Patient will remain free of pressure ulcers Date Initiated: 09/06/2016 Target Resolution Date: 12/02/2016 Goal Status: Active Interventions: Assess: immobility, friction, shearing, incontinence upon admission and as needed Notes: ` Soft Tissue Infection Nursing Diagnoses: Impaired tissue  integrity Potential for infection: soft tissue Goals: Patient will remain free of wound infection Date Initiated: 09/06/2016 Target Resolution Date: 12/02/2016 Goal Status: Active Interventions: Assess signs and symptoms of infection every visit Notes: ` Wound/Skin Impairment HARJIT, DOUDS ([id number]) Nursing Diagnoses: Impaired tissue integrity Goals: Ulcer/skin breakdown will heal within 14 weeks Date Initiated: 09/06/2016 Target Resolution Date: 12/16/2016 Goal Status: Active Interventions: Assess ulceration(s) every visit Notes: Electronic Signature(s) Signed: 06/23/2017 4:53:44 PM By: Gretta Cool, BSN, RN, CWS, Kim RN, BSN Entered By: Gretta Cool, BSN, RN, CWS, Kim on 06/23/2017 14:22:33 [name]  ([id number]) -------------------------------------------------------------------------------- Pain Assessment Details Patient Name: [name]. Date of Service: 06/23/2017 2:00 PM Medical Record Number: [id number] Patient Account Number: [id number] Date of Birth/Sex: [birth year] (83 y.o. Female) Treating RN: Cornell Barman Primary Care Madisen Ludvigsen: [name] Other Clinician: Referring Jazzlene Huot: [name] Treating Tiffanni Scarfo/Extender: Frann Rider in Treatment: 41 Active Problems Location of Pain Severity and Description of Pain Patient Has Paino No Site Locations With Dressing Change: No Pain Management and Medication Current Pain Management: Electronic Signature(s) Signed: 06/23/2017 4:53:44 PM By: Gretta Cool, BSN, RN, CWS, Kim RN, BSN Entered By: Gretta Cool, BSN, RN, CWS, Kim on 06/23/2017 14:03:32 [name] ([id number]) -------------------------------------------------------------------------------- Wound Assessment Details Patient Name: [name]. Date of Service: 06/23/2017 2:00 PM Medical Record Number: [id number] Patient Account Number: [id number] Date of Birth/Sex: 1931-02-17 (83 y.o. Female) Treating RN: Cornell Barman Primary Care Xiana Carns: [name] Other Clinician: Referring Kataleyah Carducci: [name] Treating Drayk Humbarger/Extender: Frann Rider in Treatment: 41 Wound Status Wound Number: 1 Primary Etiology: Pressure Ulcer Wound Location: Right Calcaneus - Medial Wound Status: Open Wounding Event: Pressure Injury Comorbid History: Cataracts, Hypertension Date Acquired: 07/02/2016 Weeks Of Treatment: 41 Clustered Wound: No Photos Photo Uploaded By: Gretta Cool, BSN, RN, CWS, Kim on 06/23/2017 14:20:14 Wound Measurements Length: (cm) 0.2 Width: (cm) 0.4 Depth: (cm) 0.2 Area: (cm) 0.063 Volume: (cm) 0.013 % Reduction in Area: 99.5% % Reduction in Volume: 98.9% Epithelialization: Large (67-100%) Tunneling: No Undermining: No Wound  Description Classification: Category/Stage III Wound Margin: Flat and Intact Exudate Amount: None Present Foul Odor After Cleansing: No Slough/Fibrino Yes Wound Bed Granulation Amount: Medium (34-66%) Exposed Structure Granulation Quality: Pale Fascia Exposed: No Necrotic Amount: Medium (34-66%) Fat Layer (Subcutaneous Tissue) Exposed: Yes Necrotic Quality: Adherent Slough Tendon Exposed: No Muscle Exposed: No Joint Exposed: No Bone Exposed: No Periwound Skin Texture Simington, Rahaf S. ([id number]) Texture Color No Abnormalities Noted: No No Abnormalities Noted: No Callus: No Atrophie Blanche: No Crepitus: No Cyanosis: No Excoriation: No Ecchymosis: No Induration: Yes Erythema: No Rash: No Hemosiderin Staining: No Scarring: Yes Mottled: No Pallor: No Moisture Rubor: No No Abnormalities Noted: No Dry / Scaly: Yes Temperature / Pain Maceration: No Temperature: No Abnormality Tenderness on Palpation: Yes Wound Preparation Ulcer Cleansing: Rinsed/Irrigated with Saline Topical Anesthetic Applied: Other: lidocaine 4%, Electronic Signature(s) Signed: 06/23/2017 4:53:44 PM By: Gretta Cool, BSN, RN, CWS, Kim RN, BSN Entered By: Gretta Cool, BSN, RN, CWS, Kim on 06/23/2017 14:17:25 [name] ([id number]) -------------------------------------------------------------------------------- Wound Assessment Details Patient Name: [name] Date of Service: 06/23/2017 2:00 PM Medical Record Number: [id number] Patient Account Number: [id number] Date of Birth/Sex: December 31, 1930 (83 y.o. Female) Treating RN: Cornell Barman Primary Care Biana Haggar: [name] Other Clinician: Referring Sim Choquette: [name] Treating Judine Arciniega/Extender: Frann Rider in Treatment: 41 Wound Status Wound Number: 2 Primary Etiology: Pressure Ulcer Wound Location: Left Calcaneus - Medial Wound Status: Open Wounding Event: Pressure Injury Comorbid History: Cataracts, Hypertension Date Acquired:  07/02/2016 Weeks Of Treatment: 41  Clustered Wound: No Photos Photo Uploaded By: Gretta Cool, BSN, RN, CWS, Kim on 06/23/2017 14:20:14 Wound Measurements Length: (cm) 3 Width: (cm) 2.5 Depth: (cm) 0.3 Area: (cm) 5.89 Volume: (cm) 1.767 % Reduction in Area: 70.2% % Reduction in Volume: 10.7% Epithelialization: Small (1-33%) Tunneling: No Undermining: No Wound Description Classification: Category/Stage III Wound Margin: Thickened Exudate Amount: Large Exudate Type: Serosanguineous Exudate Color: red, brown Foul Odor After Cleansing: Yes Due to Product Use: No Slough/Fibrino Yes Wound Bed Granulation Amount: Medium (34-66%) Exposed Structure Granulation Quality: Red, Pink Fascia Exposed: No Necrotic Amount: Medium (34-66%) Fat Layer (Subcutaneous Tissue) Exposed: Yes Necrotic Quality: Adherent Slough Tendon Exposed: No Muscle Exposed: No Joint Exposed: No Bone Exposed: No Purington, Levi S. ([id number]) Periwound Skin Texture Texture Color No Abnormalities Noted: No No Abnormalities Noted: No Callus: No Atrophie Blanche: No Crepitus: No Cyanosis: No Excoriation: No Ecchymosis: No Induration: No Erythema: No Rash: No Hemosiderin Staining: No Scarring: No Mottled: No Pallor: No Moisture Rubor: No No Abnormalities Noted: No Dry / Scaly: No Temperature / Pain Maceration: Yes Temperature: No Abnormality Tenderness on Palpation: Yes Wound Preparation Ulcer Cleansing: Rinsed/Irrigated with Saline Topical Anesthetic Applied: Other: lidocaine 4%, Electronic Signature(s) Signed: 06/23/2017 4:53:44 PM By: Gretta Cool, BSN, RN, CWS, Kim RN, BSN Entered By: Gretta Cool, BSN, RN, CWS, Kim on 06/23/2017 14:18:00 [name] ([id number]) -------------------------------------------------------------------------------- Vitals Details Patient Name: [name] Date of Service: 06/23/2017 2:00 PM Medical Record Number: [id number] Patient Account Number: [id number] Date of Birth/Sex:  1931/01/09 (83 y.o. Female) Treating RN: Cornell Barman Primary Care Zelene Barga: [name] Other Clinician: Referring Arjuna Doeden: [name] Treating Maycie Luera/Extender: Frann Rider in Treatment: 41 Vital Signs Time Taken: 14:03 Temperature (F): 98.4 Height (in): 63 Pulse (bpm): 62 Weight (lbs): 160 Respiratory Rate (breaths/min): 16 Body Mass Index (BMI): 28.3 Blood Pressure (mmHg): 143/57 Reference Range: 80 - 120 mg / dl Electronic Signature(s) Signed: 06/23/2017 4:53:44 PM By: Gretta Cool, BSN, RN, CWS, Kim RN, BSN Entered By: Gretta Cool, BSN, RN, CWS, Kim on 06/23/2017 14:12:01

## 2017-06-25 NOTE — Progress Notes (Signed)
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 tissue within the wound bed including Adherent Slough. The periwound skin appearance exhibited: Induration, Scarring, Dry/Scaly. The periwound skin appearance did not exhibit: Callus, Crepitus, Excoriation, Rash, Maceration, Atrophie Blanche, Cyanosis, Ecchymosis, Hemosiderin Staining, Mottled, Pallor, Rubor, Erythema. Periwound temperature was noted as No Abnormality. The periwound has tenderness on palpation. Wound #2 status is Open. Original cause of wound was Pressure Injury. The wound is located on the Left,Medial Calcaneus. The wound measures 3cm length x 2.5cm width x 0.3cm depth; 5.89cm^2 area and 1.767cm^3 volume. There is Fat Layer (Subcutaneous Tissue) Exposed exposed. There is no tunneling or undermining noted. There is a large amount of serosanguineous drainage noted. Foul odor after cleansing was noted. The wound margin is thickened. There is medium (34-66%) red, pink granulation within the wound bed. There is a medium (34-66%) amount of necrotic tissue within the wound bed including Adherent Slough. The periwound skin appearance exhibited: Maceration. The periwound skin appearance did not exhibit: Callus,  Crepitus, Excoriation, Induration, Rash, Scarring, Dry/Scaly, Atrophie Blanche, Cyanosis, Ecchymosis, Hemosiderin Staining, Mottled, Pallor, Rubor, Erythema. Periwound temperature was noted as No Abnormality. The periwound has tenderness on palpation. 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 ([id number]) Wound #1 Right,Medial Calcaneus: Clean wound with Normal Saline. Wound #2 Left,Medial Calcaneus: Clean wound with Normal Saline. Anesthetic: Wound #1 Right,Medial Calcaneus: Topical Lidocaine 4% cream applied to wound bed prior to debridement - In clinic only Wound #2 Left,Medial Calcaneus: Topical Lidocaine 4% cream applied to wound bed prior to debridement - In clinic only Primary Wound Dressing: Wound #1 Right,Medial Calcaneus: Other: - Polymem Ag Wound #2 Left,Medial Calcaneus: Other: - Polymem Ag Secondary Dressing: Wound #1 Right,Medial Calcaneus: ABD and Kerlix/Conform Wound #2 Left,Medial Calcaneus: ABD and Kerlix/Conform Dressing Change Frequency: Wound #1 Right,Medial Calcaneus: Change dressing every other day. Wound #2 Left,Medial Calcaneus: Change dressing every other day. Follow-up Appointments: Wound #1 Right,Medial Calcaneus: Return Appointment in 1 week. Wound #2 Left,Medial Calcaneus: Return Appointment in 1 week. Edema Control: Wound #1 Right,Medial Calcaneus: Elevate legs to the level of the heart and pump ankles as often as possible Wound #2 Left,Medial Calcaneus: Elevate legs to the level of the heart and pump ankles as often as possible Off-Loading: Wound #1 Right,Medial Calcaneus: Other: - No pressure on heels!!! Float heel while in chair or bed. Sage boots at night along with floating heels. Wound #2 Left,Medial Calcaneus: Other: - No pressure on heels!!! Float heel while in chair or  bed. Sage boots at  night along with floating heels. Additional Orders / Instructions: Wound #1 Right,Medial Calcaneus: Increase protein intake. Other: - Vitamins A, C and Zinc Wound #2 Left,Medial Calcaneus: Increase protein intake. Other: - Vitamins A, C and Zinc Home Health: Wound #1 Right,Medial Calcaneus: [facility] Visits - [address] MATHA, MASSE ([id number]) [place] Nurse may visit PRN to address patient s wound care needs. FACE TO FACE ENCOUNTER: MEDICARE and MEDICAID PATIENTS: I certify that this patient is under my care and that I had a face-to-face encounter that meets the physician face-to-face encounter requirements with this patient on this date. The encounter with the patient was in whole or in part for the following MEDICAL CONDITION: (primary reason for [place]) MEDICAL NECESSITY: I certify, that based on my findings, NURSING services are a medically necessary home health service. HOME BOUND STATUS: I certify that my clinical findings support that this patient is homebound (i.e., Due to illness or injury, pt requires aid of supportive devices such as crutches, cane, wheelchairs, walkers, the use of special transportation or the assistance of another person to leave their place of residence. There is a normal inability to leave the home and doing so requires considerable and taxing effort. Other absences are for medical reasons / religious services and are infrequent or of short duration when for other reasons). If current dressing causes regression in wound condition, may D/C ordered dressing product/s and apply Normal Saline Moist Dressing daily until next [place] / Other MD appointment. [place] of regression in wound condition at [phone]. Please direct any NON-WOUND related issues/requests for orders to patient's Primary Care Physician Wound #2 Left,Medial Calcaneus: [place] Visits -  [place] Nurse may visit PRN to address patient s wound care needs. FACE TO FACE ENCOUNTER: MEDICARE and MEDICAID PATIENTS: I certify that this patient is under my care and that I had a face-to-face encounter that meets the physician face-to-face encounter requirements with this patient on this date. The encounter with the patient was in whole or in part for the following MEDICAL CONDITION: (primary reason for [place]) MEDICAL NECESSITY: I certify, that based on my findings, NURSING services are a medically necessary home health service. HOME BOUND STATUS: I certify that my clinical findings support that this patient is homebound (i.e., Due to illness or injury, pt requires aid of supportive devices such as crutches, cane, wheelchairs, walkers, the use of special transportation or the assistance of another person to leave their place of residence. There is a normal inability to leave the home and doing so requires considerable and taxing effort. Other absences are for medical reasons / religious services and are infrequent or of short duration when for other reasons). If current dressing causes regression in wound condition, may D/C ordered dressing product/s and apply Normal Saline Moist Dressing daily until next [place] / Other MD appointment. [place] of regression in wound condition at [phone]. Please direct any NON-WOUND related issues/requests for orders to patient's Primary Care Physician After review today I have recommended: 1. Polymem silver be applied to the right heel and left wound and this can be changed every other day with an appropriate offloading technique. 2. Continue with adequate protein, vitamin A, vitamin C and zinc 3. Regular visits to the wound center -- every week has been stressed Electronic Signature(s) Signed: 06/23/2017 4:09:31 PM By: Christin Fudge MD, FACS Entered By: Christin Fudge on 06/23/2017  14:33:37 [name] ([id number])  SUSSAN, METER ([id number]) -------------------------------------------------------------------------------- SuperBill Details Patient Name: TAMISHA, NORDSTROM. Date of Service: 06/23/2017 Medical Record Number: [id number] Patient Account Number: [id number] Date of Birth/Sex: [birth year] (83 y.o. Female) Treating RN: Cornell Barman Primary Care Provider: [name] Other Clinician: Referring Provider: [name] Treating Provider/Extender: Frann Rider in Treatment: 41 Diagnosis Coding ICD-10 Codes Code Description [id number] Pressure ulcer of left heel, stage 3 L89.613 Pressure ulcer of right heel, stage 3 Z99.3 Dependence on wheelchair Facility Procedures CPT4 Code: [id number] Description: 99213 - WOUND CARE VISIT-LEV 3 EST PT Modifier: Quantity: 1 Physician Procedures CPT4 Code: [id number] Description: 27782 - WC PHYS LEVEL 3 - EST PT ICD-10 Description Diagnosis L89.623 Pressure ulcer of left heel, stage 3 L89.613 Pressure ulcer of right heel, stage 3 Z99.3 Dependence on wheelchair Modifier: Quantity: 1 Electronic Signature(s) Signed: 06/24/2017 3:48:14 PM By: Christin Fudge MD, FACS Signed: 06/24/2017 4:54:08 PM By: Gretta Cool, BSN, RN, CWS, Kim RN, BSN Previous Signature: 06/23/2017 4:09:31 PM Version By: Christin Fudge MD, FACS Entered By: Gretta Cool, BSN, RN, CWS, Kim on 06/24/2017 11:20:55

## 2017-06-30 ENCOUNTER — Encounter: Payer: Medicare HMO | Admitting: Surgery

## 2017-06-30 DIAGNOSIS — L89623 Pressure ulcer of left heel, stage 3: Secondary | ICD-10-CM | POA: Diagnosis not present

## 2017-07-02 NOTE — Progress Notes (Signed)
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 DAYSI, BOGGAN ([id number]) Anesthetic: Wound #2 Left,Medial Calcaneus: Topical Lidocaine 4% cream applied to wound bed prior to debridement - In clinic only Primary Wound Dressing: Wound #2 Left,Medial Calcaneus: Other: - Polymem Ag Secondary Dressing: Wound #2 Left,Medial Calcaneus: ABD and Kerlix/Conform Dressing Change Frequency: Wound #2 Left,Medial Calcaneus: Change dressing every other day. Follow-up Appointments: Wound #2 Left,Medial Calcaneus: Return Appointment in 1 week. Edema Control: Wound #2 Left,Medial Calcaneus: Elevate legs to the level of the heart and pump ankles as often as possible Off-Loading: Wound #2 Left,Medial Calcaneus: Other: - No pressure on heels!!! Float heel while in chair or bed. Sage boots at night along with floating heels. Additional Orders / Instructions: Wound #2 Left,Medial Calcaneus: Increase protein intake. Other: - Vitamins A, C and Zinc Home Health: Wound #2 Left,Medial Calcaneus: Continue Home Health Visits - [place] Nurse may visit PRN to address patient s wound care needs. FACE TO FACE ENCOUNTER: MEDICARE and MEDICAID PATIENTS: I certify that this patient is under my care and that I had a face-to-face encounter that meets the physician face-to-face encounter requirements with this patient on this date. The encounter with the patient was in whole or in part for the following MEDICAL CONDITION: (primary reason for [name]) MEDICAL NECESSITY: I certify, that based on my findings, NURSING services are a medically necessary home health service. HOME BOUND STATUS: I certify that my clinical findings support that this patient is homebound (i.e., Due  to illness or injury, pt requires aid of supportive devices such as crutches, cane, wheelchairs, walkers, the use of special transportation or the assistance of another person to leave their place of residence. There is a normal inability to leave the home and doing so requires considerable and taxing effort. Other absences are for medical reasons / religious services and are infrequent or of short duration when for other reasons). If current dressing causes regression in wound condition, may D/C ordered dressing product/s and apply Normal Saline Moist Dressing daily until next [place] / Other MD appointment. [place] of regression in wound condition at [phone]. Please direct any NON-WOUND related issues/requests for orders to patient's Primary Care Physician SHANEQUA, WHITENIGHT ([id number]) After review today I have recommended: 1. Polymem silver be applied to the right heel and left heel which has recently healed to be protected with foam on the wound and this can be changed every other day with an appropriate offloading technique. 2. Continue with adequate protein, vitamin A, vitamin C and zinc 3. Regular visits to the wound center -- every week has been stressed Electronic Signature(s) Signed: 06/30/2017 5:02:50 PM By: Christin Fudge MD, FACS Entered By: Christin Fudge on 06/30/2017 15:36:30 [name] ([id number]) -------------------------------------------------------------------------------- SuperBill Details Patient Name: [name]. Date of Service: 06/30/2017 Medical Record Number: [id number] Patient Account Number: [id number] Date of Birth/Sex: 1931-03-17 (83 y.o. 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 Dependence on wheelchair Facility Procedures CPT4 Code: [id number] Description: [place]  VISIT-LEV 3 EST PT Modifier: Quantity: 1 Physician Procedures CPT4 Code: [id number] Description: 75436 - WC PHYS LEVEL 3 - EST PT ICD-10 Description Diagnosis L89.623 Pressure ulcer of left heel, stage 3 L89.613 Pressure ulcer of right heel, 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

## 2017-07-04 NOTE — Progress Notes (Signed)
KHARIZMA, LESNICK ([id number]) Visit Report for 06/30/2017 Arrival Information Details Patient Name: [name], [name]. Date of Service: 06/30/2017 2:00 PM Medical Record Number: [id number] Patient Account Number: [id number] Date of Birth/Sex: May 05, 1931 (83 y.o. Female) Treating RN: Cornell Barman Primary Care Jacqueline Spofford: [name] Other Clinician: Referring Rawn Quiroa: [name] Treating Soniyah Mcglory/Extender: Frann Rider in Treatment: 42 Visit Information History Since Last Visit Any new allergies or adverse reactions: No Patient Arrived: Wheel Chair Had a fall or experienced change in No activities of daily living that may affect Arrival Time: 14:37 risk of falls: Accompanied By: self Signs or symptoms of abuse/neglect since No Transfer Assistance: [employer] fast streamer last visito Patient Identification Verified: Yes Hospitalized since last visit: No Secondary Verification Process Yes Has Dressing in Place as Prescribed: Yes Completed: Has Footwear/Offloading in Place as Yes Patient Requires Transmission-Based No Prescribed: Precautions: Left: Other:sage Patient Has Alerts: No boots Right: Other:sage boots Pain Present Now: No Electronic Signature(s) Signed: 06/30/2017 5:55:00 PM By: Gretta Cool, BSN, RN, CWS, Kim RN, BSN Entered By: Gretta Cool, BSN, RN, CWS, Kim on 06/30/2017 14:37:49 [name] ([id number]) -------------------------------------------------------------------------------- Clinic Level of Care Assessment Details Patient Name: [name]. Date of Service: 06/30/2017 2:00 PM Medical Record Number: [id number] Patient Account Number: [id number] Date of Birth/Sex: [birth year] (83 y.o. Female) Treating RN: Cornell Barman Primary Care Gardenia Witter: [name] Other Clinician: Referring Amrom Ore: [name] Treating Revin Corker/Extender: Frann Rider in Treatment: 42 Clinic Level of Care Assessment Items TOOL 4 Quantity Score []  - Use when only an EandM is  performed on FOLLOW-UP visit 0 ASSESSMENTS - Nursing Assessment / Reassessment []  - Reassessment of Co-morbidities (includes updates in patient status) 0 X - Reassessment of Adherence to Treatment Plan 1 5 ASSESSMENTS - Wound and Skin Assessment / Reassessment X - Simple Wound Assessment / Reassessment - one wound 1 5 []  - Complex Wound Assessment / Reassessment - multiple wounds 0 []  - Dermatologic / Skin Assessment (not related to wound area) 0 ASSESSMENTS - Focused Assessment []  - Circumferential Edema Measurements - multi extremities 0 []  - Nutritional Assessment / Counseling / Intervention 0 []  - Lower Extremity Assessment (monofilament, tuning fork, pulses) 0 []  - Peripheral Arterial Disease Assessment (using hand held doppler) 0 ASSESSMENTS - Ostomy and/or Continence Assessment and Care []  - Incontinence Assessment and Management 0 []  - Ostomy Care Assessment and Management (repouching, etc.) 0 PROCESS - Coordination of Care X - Simple Patient / Family Education for ongoing care 1 15 []  - Complex (extensive) Patient / Family Education for ongoing care 0 X - Staff obtains Programmer, systems, Records, Test Results / Process Orders 1 10 []  - Staff telephones HHA, Nursing Homes / Clarify orders / etc 0 []  - Routine Transfer to another Facility (non-emergent condition) 0 KEYARRA, RENDALL ([id number]) []  - Routine Hospital Admission (non-emergent condition) 0 []  - New Admissions / Biomedical engineer / Ordering NPWT, Apligraf, etc. 0 []  - Emergency Hospital Admission (emergent condition) 0 X - Simple Discharge Coordination 1 10 []  - Complex (extensive) Discharge Coordination 0 PROCESS - Special Needs []  - Pediatric / Minor Patient Management 0 []  - Isolation Patient Management 0 []  - Hearing / Language / Visual special needs 0 []  - Assessment of Community assistance (transportation, D/C planning, etc.) 0 []  - Additional assistance / Altered mentation 0 []  - Support Surface(s) Assessment  (bed, cushion, seat, etc.) 0 INTERVENTIONS - Wound Cleansing / Measurement X - Simple Wound Cleansing - one wound 1 5 []  - Complex Wound Cleansing - multiple  wounds 0 X - Wound Imaging (photographs - any number of wounds) 1 5 []  - Wound Tracing (instead of photographs) 0 X - Simple Wound Measurement - one wound 1 5 []  - Complex Wound Measurement - multiple wounds 0 INTERVENTIONS - Wound Dressings []  - Small Wound Dressing one or multiple wounds 0 X - Medium Wound Dressing one or multiple wounds 1 15 []  - Large Wound Dressing one or multiple wounds 0 []  - Application of Medications - topical 0 []  - Application of Medications - injection 0 INTERVENTIONS - Miscellaneous []  - External ear exam 0 [name], Danielle S. ([id number]) []  - Specimen Collection (cultures, biopsies, blood, body fluids, etc.) 0 []  - Specimen(s) / Culture(s) sent or taken to Lab for analysis 0 []  - Patient Transfer (multiple staff / Harrel Lemon Lift / Similar devices) 0 []  - Simple Staple / Suture removal (25 or less) 0 []  - Complex Staple / Suture removal (26 or more) 0 []  - Hypo / Hyperglycemic Management (close monitor of Blood Glucose) 0 []  - Ankle / Brachial Index (ABI) - do not check if billed separately 0 X - Vital Signs 1 5 Has the patient been seen at the hospital within the last three years: Yes Total Score: 80 Level Of Care: New/Established - Level 3 Electronic Signature(s) Signed: 06/30/2017 5:55:00 PM By: Gretta Cool, BSN, RN, CWS, Kim RN, BSN Entered By: Gretta Cool, BSN, RN, CWS, Kim on 06/30/2017 15:06:33 [name] ([id number]) -------------------------------------------------------------------------------- Encounter Discharge Information Details Patient Name: [name]. Date of Service: 06/30/2017 2:00 PM Medical Record Number: [id number] Patient Account Number: [id number] Date of Birth/Sex: 10-15-1931 (83 y.o. Female) Treating RN: Cornell Barman Primary Care Malee Grays: [name] Other Clinician: Referring  Glenora Morocho: [name] Treating Dessie Delcarlo/Extender: Frann Rider in Treatment: 1 Encounter Discharge Information Items Discharge Pain Level: 0 Discharge Condition: Stable Ambulatory Status: Ambulatory Discharge Destination: Home Transportation: Private Auto Accompanied By: self Schedule Follow-up Appointment: Yes Medication Reconciliation completed and provided to Patient/Care Yes Naisha Wisdom: Provided on Clinical Summary of Care: 06/30/2017 Form Type Recipient Paper Patient EB Electronic Signature(s) Signed: 07/04/2017 10:13:53 AM By: Ruthine Dose Entered By: Ruthine Dose on 06/30/2017 15:09:08 [name] ([id number]) -------------------------------------------------------------------------------- Lower Extremity Assessment Details Patient Name: [name]. Date of Service: 06/30/2017 2:00 PM Medical Record Number: [id number] Patient Account Number: [id number] Date of Birth/Sex: [birth year] (83 y.o. Female) Treating RN: Cornell Barman Primary Care Laren Orama: [name] Other Clinician: Referring Lucianne Smestad: [name] Treating Lynsay Fesperman/Extender: Frann Rider in Treatment: 42 Vascular Assessment Pulses: Dorsalis Pedis Palpable: [Left:Yes] [Right:Yes] Posterior Tibial Extremity colors, hair growth, and conditions: Extremity Color: [Left:Normal] [Right:Normal] Hair Growth on Extremity: [Left:No] [Right:No] Temperature of Extremity: [Left:Warm] [Right:Warm] Capillary Refill: [Left:< 3 seconds] [Right:< 3 seconds] Toe Nail Assessment Left: Right: Thick: Yes Yes Discolored: Yes Yes Deformed: Yes Yes Improper Length and Hygiene: Yes Yes Notes Patient states she has an upcoming appointment September 24th to get her nails trimmed. Electronic Signature(s) Signed: 06/30/2017 5:55:00 PM By: Gretta Cool, BSN, RN, CWS, Kim RN, BSN Entered By: Gretta Cool, BSN, RN, CWS, Kim on 06/30/2017 14:43:54 [name]  ([id number]) -------------------------------------------------------------------------------- Multi Wound Chart Details Patient Name: [name] Date of Service: 06/30/2017 2:00 PM Medical Record Number: [id number] Patient Account Number: [id number] Date of Birth/Sex: 1931/01/08 (83 y.o. Female) Treating RN: Cornell Barman Primary Care Shakaya Bhullar: [name] Other Clinician: Referring Marylynne Keelin: [name] Treating Akyra Bouchie/Extender: Frann Rider in Treatment: 42 Vital Signs Height(in): 63 Pulse(bpm): 63 Weight(lbs): 160 Blood Pressure 142/60 (mmHg): Body Mass Index(BMI): 28  Temperature(F): 98.7 Respiratory Rate 14 (breaths/min): Photos: [1:No Photos] [2:No Photos] [N/A:N/A] Wound Location: [1:Right Calcaneus - Medial] [2:Left, Medial Calcaneus] [N/A:N/A] Wounding Event: [1:Pressure Injury] [2:Pressure Injury] [N/A:N/A] Primary Etiology: [1:Pressure Ulcer] [2:Pressure Ulcer] [N/A:N/A] Comorbid History: [1:Cataracts, Hypertension] [2:N/A] [N/A:N/A] Date Acquired: [1:07/02/2016] [2:07/02/2016] [N/A:N/A] Weeks of Treatment: [1:42] [2:42] [N/A:N/A] Wound Status: [1:Healed - Epithelialized] [2:Open] [N/A:N/A] Measurements L x W x D 0x0x0 [2:2.8x2.4x0.2] [N/A:N/A] (cm) Area (cm) : [1:0] [2:5.278] [N/A:N/A] Volume (cm) : [1:0] [2:1.056] [N/A:N/A] % Reduction in Area: [1:100.00%] [2:73.30%] [N/A:N/A] % Reduction in Volume: 100.00% [2:46.60%] [N/A:N/A] Classification: [1:Category/Stage III] [2:Category/Stage III] [N/A:N/A] Exudate Amount: [1:None Present] [2:N/A] [N/A:N/A] Granulation Amount: [1:None Present (0%)] [2:N/A] [N/A:N/A] Necrotic Amount: [1:None Present (0%)] [2:N/A] [N/A:N/A] Exposed Structures: [1:Fascia: No Fat Layer (Subcutaneous Tissue) Exposed: No Tendon: No Muscle: No Joint: No Bone: No] [2:N/A] [N/A:N/A] Epithelialization: [1:Large (67-100%)] [2:N/A] [N/A:N/A] Periwound Skin Texture: No Abnormalities Noted [2:No Abnormalities Noted]  [N/A:N/A] Periwound Skin [1:No Abnormalities Noted] [2:No Abnormalities Noted] [N/A:N/A] Moisture: Periwound Skin Color: No Abnormalities Noted [2:No Abnormalities Noted] [N/A:N/A] Tenderness on No No N/A Palpation: Treatment Notes Electronic Signature(s) Signed: 06/30/2017 5:02:50 PM By: Christin Fudge MD, FACS Entered By: Christin Fudge on 06/30/2017 14:54:24 [name] ([id number]) -------------------------------------------------------------------------------- [place] Details Patient Name: [name]. Date of Service: 06/30/2017 2:00 PM Medical Record Number: [id number] Patient Account Number: [id number] Date of Birth/Sex: [birth year] (83 y.o. Female) Treating RN: Cornell Barman Primary Care Samauri Kellenberger: [name] Other Clinician: Referring Tionna Gigante: [name] Treating Raman Featherston/Extender: Frann Rider in Treatment: 53 Active Inactive ` Abuse / Safety / Falls / Self Care Management Nursing Diagnoses: Impaired physical mobility Potential for falls Goals: Patient will remain injury free Date Initiated: 09/06/2016 Target Resolution Date: 12/02/2016 Goal Status: Active Interventions: Assess fall risk on admission and as needed Assess self care needs on admission and as needed Notes: ` Necrotic Tissue Nursing Diagnoses: Impaired tissue integrity related to necrotic/devitalized tissue Goals: Necrotic/devitalized tissue will be minimized in the wound bed Date Initiated: 09/06/2016 Target Resolution Date: 12/02/2016 Goal Status: Active Interventions: Assess patient pain level pre-, during and post procedure and prior to discharge Notes: ` Orientation to the Wound Care Program Nursing Diagnoses: [name], [name] ([id number]) Knowledge deficit related to the wound healing center program Goals: Patient/caregiver will verbalize understanding of the [facility] Program Date Initiated: 09/06/2016 Target Resolution Date:  12/02/2016 Goal Status: Active Interventions: Provide education on orientation to the wound center Notes: ` Pressure Nursing Diagnoses: Knowledge deficit related to management of pressures ulcers Potential for impaired tissue integrity related to pressure, friction, moisture, and shear Goals: Patient will remain free of pressure ulcers Date Initiated: 09/06/2016 Target Resolution Date: 12/02/2016 Goal Status: Active Interventions: Assess: immobility, friction, shearing, incontinence upon admission and as needed Notes: ` Soft Tissue Infection Nursing Diagnoses: Impaired tissue integrity Potential for infection: soft tissue Goals: Patient will remain free of wound infection Date Initiated: 09/06/2016 Target Resolution Date: 12/02/2016 Goal Status: Active Interventions: Assess signs and symptoms of infection every visit Notes: ` Wound/Skin Impairment [name], [name] ([id number]) Nursing Diagnoses: Impaired tissue integrity Goals: Ulcer/skin breakdown will heal within 14 weeks Date Initiated: 09/06/2016 Target Resolution Date: 12/16/2016 Goal Status: Active Interventions: Assess ulceration(s) every visit Notes: Electronic Signature(s) Signed: 06/30/2017 5:55:00 PM By: Gretta Cool, BSN, RN, CWS, Kim RN, BSN Entered By: Gretta Cool, BSN, RN, CWS, Kim on 06/30/2017 14:48:42 [name] ([id number]) -------------------------------------------------------------------------------- Pain Assessment Details Patient Name: [name]. Date of Service: 06/30/2017 2:00 PM Medical Record Number: [id number] Patient Account Number:  [id number] Date of Birth/Sex: 1931/04/12 (83 y.o. Female) Treating RN: Cornell Barman Primary Care Modest Draeger: [name] Other Clinician: Referring Vikas Wegmann: [name] Treating Kavion Mancinas/Extender: Frann Rider in Treatment: 42 Active Problems Location of Pain Severity and Description of Pain Patient Has Paino No Site Locations With Dressing  Change: No Pain Management and Medication Current Pain Management: Goals for Pain Management Topical or injectable lidocaine is offered to patient for acute pain when surgical debridement is performed. If needed, Patient is instructed to use over the counter pain medication for the following 24-48 hours after debridement. Wound care MDs do not prescribed pain medications. Patient has chronic pain or uncontrolled pain. Patient has been instructed to make an appointment with their Primary Care Physician for pain management. Electronic Signature(s) Signed: 06/30/2017 5:55:00 PM By: Gretta Cool, BSN, RN, CWS, Kim RN, BSN Entered By: Gretta Cool, BSN, RN, CWS, Kim on 06/30/2017 14:37:58 [name] ([id number]) -------------------------------------------------------------------------------- Patient/Caregiver Education Details Patient Name: [name] Date of Service: 06/30/2017 2:00 PM Medical Record Number: [id number] Patient Account Number: [id number] Date of Birth/Gender: September 29, 1931 (83 y.o. Female) Treating RN: Cornell Barman Primary Care Physician: [name] Other Clinician: Referring Physician: [name] Treating Physician/Extender: Frann Rider in Treatment: 53 Education Assessment Education Provided To: Patient Education Topics Provided Wound/Skin Impairment: Handouts: Caring for Your Ulcer Methods: Demonstration Responses: State content correctly Electronic Signature(s) Signed: 06/30/2017 5:55:00 PM By: Gretta Cool, BSN, RN, CWS, Kim RN, BSN Entered By: Gretta Cool, BSN, RN, CWS, Kim on 06/30/2017 15:07:42 [name] ([id number]) -------------------------------------------------------------------------------- Wound Assessment Details Patient Name: [name] Date of Service: 06/30/2017 2:00 PM Medical Record Number: [id number] Patient Account Number: [id number] Date of Birth/Sex: 10-19-1930 (83 y.o. Female) Treating RN: Cornell Barman Primary Care Shirleymae Hauth: [name] Other Clinician: Referring Elizabeth Haff: [name] Treating Tiye Huwe/Extender: Frann Rider in Treatment: 42 Wound Status Wound Number: 1 Primary Etiology: Pressure Ulcer Wound Location: Right Calcaneus - Medial Wound Status: Healed - Epithelialized Wounding Event: Pressure Injury Comorbid History: Cataracts, Hypertension Date Acquired: 07/02/2016 Weeks Of Treatment: 42 Clustered Wound: No Photos Photo Uploaded By: Gretta Cool, BSN, RN, CWS, Kim on 06/30/2017 16:03:40 Wound Measurements Length: (cm) 0 % Reduction in Width: (cm) 0 % Reduction in Depth: (cm) 0 Epithelializat Area: (cm) 0 Tunneling: Volume: (cm) 0 Undermining: Area: 100% Volume: 100% ion: Large (67-100%) No No Wound Description Classification: Category/Stage III Foul Odor Afte Exudate Amount: None Present Slough/Fibrino r Cleansing: No No Wound Bed Granulation Amount: None Present (0%) Exposed Structure Necrotic Amount: None Present (0%) Fascia Exposed: No Fat Layer (Subcutaneous Tissue) Exposed: No Tendon Exposed: No Muscle Exposed: No Joint Exposed: No Bone Exposed: No Periwound Skin Texture [name], Danielle S. ([id number]) Texture Color No Abnormalities Noted: No No Abnormalities Noted: No Moisture No Abnormalities Noted: No Electronic Signature(s) Signed: 06/30/2017 5:55:00 PM By: Gretta Cool, BSN, RN, CWS, Kim RN, BSN Entered By: Gretta Cool, BSN, RN, CWS, Kim on 06/30/2017 14:49:49 [name] ([id number]) -------------------------------------------------------------------------------- Wound Assessment Details Patient Name: [name]. Date of Service: 06/30/2017 2:00 PM Medical Record Number: [id number] Patient Account Number: [id number] Date of Birth/Sex: 1930-12-23 (82 y.o. Female) Treating RN: Cornell Barman Primary Care Usama Harkless: [name] Other Clinician: Referring Leonel Mccollum: [name] Treating Kayci [place]/Extender: Frann Rider in Treatment: 42 Wound  Status Wound Number: 2 Primary Etiology: Pressure Ulcer Wound Location: Left, Medial Calcaneus Wound Status: Open Wounding Event: Pressure Injury Date Acquired: 07/02/2016 Weeks Of Treatment: 42 Clustered Wound: No Photos Photo Uploaded By: Gretta Cool, BSN, RN, CWS, Kim on 06/30/2017 16:03:40 Wound Measurements  Length: (cm) 2.8 Width: (cm) 2.4 Depth: (cm) 0.2 Area: (cm) 5.278 Volume: (cm) 1.056 % Reduction in Area: 73.3% % Reduction in Volume: 46.6% Wound Description Classification: Category/Stage III Periwound Skin Texture Texture Color No Abnormalities Noted: No No Abnormalities Noted: No Moisture No Abnormalities Noted: No Treatment Notes Wound #2 (Left, Medial Calcaneus) 1. Cleansed with: Clean wound with Normal Saline 2. Anesthetic [name], [name] ([id number]) Topical Lidocaine 4% cream to wound bed prior to debridement 4. Dressing Applied: Other dressing (specify in notes) 5. Secondary Dressing Applied ABD and Kerlix/Conform Notes Polymem [employer] Electronic Signature(s) Signed: 06/30/2017 5:55:00 PM By: Gretta Cool, BSN, RN, CWS, Kim RN, BSN Entered By: Gretta Cool, BSN, RN, CWS, Kim on 06/30/2017 14:41:09 [name] ([id number]) -------------------------------------------------------------------------------- [place] Details Patient Name: [name] Date of Service: 06/30/2017 2:00 PM Medical Record Number: [id number] Patient Account Number: [id number] Date of Birth/Sex: 1931/05/08 (83 y.o. Female) Treating RN: Cornell Barman Primary Care Jere Bostrom: [name] Other Clinician: Referring Cormick Moss: [name] Treating Talmadge Ganas/Extender: Frann Rider in Treatment: 42 Vital Signs Time Taken: 14:38 Temperature (F): 98.7 Height (in): 63 Pulse (bpm): 63 Weight (lbs): 160 Respiratory Rate (breaths/min): 14 Body Mass Index (BMI): 28.3 Blood Pressure (mmHg): 142/60 Reference Range: 80 - 120 mg / dl Electronic Signature(s) Signed: 06/30/2017 5:55:00 PM  By: Gretta Cool, BSN, RN, CWS, Kim RN, BSN Entered By: Gretta Cool, BSN, RN, CWS, Kim on 06/30/2017 14:38:25

## 2017-07-07 ENCOUNTER — Encounter: Payer: Medicare HMO | Admitting: Surgery
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
## 2017-07-14 ENCOUNTER — Encounter: Payer: Medicare HMO | Admitting: Surgery

## 2017-07-14 ENCOUNTER — Encounter: Payer: Self-pay | Admitting: Urology

## 2017-07-14 ENCOUNTER — Ambulatory Visit (INDEPENDENT_AMBULATORY_CARE_PROVIDER_SITE_OTHER): Payer: Medicare HMO | Admitting: Urology

## 2017-07-14 VITALS — BP 159/71 | HR 76 | Ht 63.0 in | Wt 124.0 lb

## 2017-07-14 DIAGNOSIS — N390 Urinary tract infection, site not specified: Secondary | ICD-10-CM

## 2017-07-14 DIAGNOSIS — N183 Chronic kidney disease, stage 3 unspecified: Secondary | ICD-10-CM

## 2017-07-14 DIAGNOSIS — R339 Retention of urine, unspecified: Secondary | ICD-10-CM | POA: Diagnosis not present

## 2017-07-14 DIAGNOSIS — L89623 Pressure ulcer of left heel, stage 3: Secondary | ICD-10-CM | POA: Diagnosis not present

## 2017-07-14 NOTE — Progress Notes (Signed)
Suprapubic Cath Change  Patient is present today for a suprapubic catheter change due to urinary retention.  79ml of water was drained from the balloon, a 16FR foley cath was removed from the tract with out difficulty.  Site was cleaned and prepped in a sterile fashion with betadine.  A 16FR foley cath was replaced into the tract no complications were noted. Urine return was noted, 10 ml of sterile water was inflated into the balloon and a Night bag was attached for drainage.  Patient tolerated well. A leg bag was given to patient and proper instruction was given on how to switch bags.    Preformed by: Lyndee Hensen CMA  Follow up: One month on nurse schedule

## 2017-07-16 NOTE — Progress Notes (Signed)
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 Left,Medial Calcaneus . There was a Skin/Subcutaneous Tissue Debridement (32440-10272) debridement with total area of 2.1 sq cm performed by Christin Fudge, MD. with the following instrument(s): Curette to remove Viable and Non-Viable tissue/material including Fibrin/Slough and Subcutaneous after achieving pain control using Other (lidocaine 4%). A time out was conducted at 14:08, prior to the start of the procedure. A Minimum amount of bleeding was controlled with Pressure. The procedure was tolerated well with a pain level of 0 throughout and a pain level of 0 following the procedure. Post Debridement Measurements: 1.5cm length x 1.4cm width x 0.1cm depth; 0.165cm^3 volume. Post debridement Stage noted as Category/Stage III. Character of Wound/Ulcer Post Debridement requires further debridement. Post procedure Diagnosis Wound #2: Same as Pre-Procedure [name], [name]. ([id number]) Plan Wound Cleansing: Wound #2 Left,Medial Calcaneus: Clean wound with Normal Saline. Anesthetic: Wound #2 Left,Medial Calcaneus: Topical Lidocaine 4% cream applied to wound bed prior to debridement - In clinic only Primary Wound Dressing: Wound #2 Left,Medial Calcaneus: Other: - Polymem Ag Secondary Dressing: Wound #2 Left,Medial Calcaneus: ABD  and Kerlix/Conform Dressing Change Frequency: Wound #2 Left,Medial Calcaneus: Change dressing every other day. Follow-up Appointments: Wound #2 Left,Medial Calcaneus: Return Appointment in 1 week. Edema Control: Wound #2 Left,Medial Calcaneus: Elevate legs to the level of the heart and pump ankles as often as possible Off-Loading: Wound #2 Left,Medial Calcaneus: Other: - No pressure on heels!!! Float heel while in chair or bed. Sage boots at night along with floating heels. Additional Orders / Instructions: Wound #2 Left,Medial Calcaneus: Increase protein intake. Other: - Vitamins A, C and Zinc Home Health: Wound #2 Left,Medial Calcaneus: Continue Home Health Visits - [place] Nurse may visit PRN to address patient s wound care needs. FACE TO FACE ENCOUNTER: MEDICARE and MEDICAID PATIENTS: I certify that this patient is under my care and that I had a face-to-face encounter that meets the physician face-to-face encounter requirements with this patient on this date. The encounter with the patient was in whole or in part for the following MEDICAL CONDITION: (primary reason for [place]) MEDICAL NECESSITY: I certify, that based on my findings, NURSING services are a medically necessary home health service. HOME BOUND STATUS: I certify that my clinical findings support that this patient is homebound (i.e., Due to illness or injury, pt requires aid of supportive devices such as crutches, cane, wheelchairs, walkers, the use of special transportation or the assistance of another person to leave their place of residence. There is a normal inability to leave the home and doing so requires considerable and taxing effort. Other absences are for medical reasons / religious services and are infrequent or of short duration when for other reasons). If current dressing causes regression in wound condition, may D/C ordered dressing product/s and apply Normal Saline Moist Dressing  daily until next [place] / Other MD appointment. Notify Wound [name], [name] ([id number]) Hardy of regression in wound condition at [phone]. Please direct any NON-WOUND related issues/requests for orders to patient's Primary Care Physician After sharp debridement and review today I have recommended: 1. Polymem silver be applied to the  right heel and left heel which has recently healed to be protected with foam on the wound and this can be changed every other day with an appropriate offloading technique. 2. Continue with adequate protein, vitamin A, vitamin C and zinc 3. Regular visits to the wound center -- every week has been stressed Electronic Signature(s) Signed: 07/14/2017 4:10:10 PM By: Christin Fudge MD, FACS Entered By: Christin Fudge on 07/14/2017 14:26:08 [name] ([id number]) -------------------------------------------------------------------------------- SuperBill Details Patient Name: [name] Date of Service: 07/14/2017 Medical Record Number: [id number] Patient Account Number: [id number] Date of Birth/Sex: 30-Dec-1930 (83 y.o. 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

## 2017-07-16 NOTE — Progress Notes (Signed)
[name], [name] ([id number]) Visit Report for 07/14/2017 Arrival Information Details Patient Name: [name], [name]. Date of Service: 07/14/2017 2:00 PM Medical Record Number: [id number] Patient Account Number: [id number] Date of Birth/Sex: [birth year] (83 y.o. Female) Treating RN: Cornell Barman Primary Care Alisha Burgo: [name] Other Clinician: Referring Mahaley Schwering: [name] Treating Wynn Kernes/Extender: Frann Rider in Treatment: 33 Visit Information History Since Last Visit Added or deleted any medications: No Patient Arrived: Wheel Chair Any new allergies or adverse reactions: No Arrival Time: 13:49 Had a fall or experienced change in No activities of daily living that may affect Accompanied By: self risk of falls: Transfer Assistance: [employer] Signs or symptoms of abuse/neglect since last No Patient Identification Verified: Yes visito Secondary Verification Process Yes Hospitalized since last visit: No Completed: Has Dressing in Place as Prescribed: Yes Patient Requires Transmission-Based No Pain Present Now: No Precautions: Patient Has Alerts: No Electronic Signature(s) Signed: 07/14/2017 4:44:24 PM By: Gretta Cool, BSN, RN, CWS, Kim RN, BSN Entered By: Gretta Cool, BSN, RN, CWS, Kim on 07/14/2017 13:50:19 [name] ([id number]) -------------------------------------------------------------------------------- Encounter Discharge Information Details Patient Name: [name]. Date of Service: 07/14/2017 2:00 PM Medical Record Number: [id number] Patient Account Number: [id number] Date of Birth/Sex: 1931-03-08 (83 y.o. Female) Treating RN: Cornell Barman Primary Care Tuana Hoheisel: [name] Other Clinician: Referring Brayden Betters: [name] Treating Phynix Horton/Extender: Frann Rider in Treatment: 72 Encounter Discharge Information Items Discharge Pain Level: 0 Discharge Condition: Stable Ambulatory Status: Walker Discharge Destination:  Home Transportation: Private Auto Accompanied By: sister Schedule Follow-up Appointment: Yes Medication Reconciliation completed Yes and provided to Patient/Care Solash Tullo: Provided on Clinical Summary of Care: 07/14/2017 Form Type Recipient Paper Patient EB Electronic Signature(s) Signed: 07/14/2017 2:30:28 PM By: Gretta Cool, BSN, RN, CWS, Kim RN, BSN Entered By: Gretta Cool, BSN, RN, CWS, Kim on 07/14/2017 14:30:28 [name] ([id number]) -------------------------------------------------------------------------------- Lower Extremity Assessment Details Patient Name: [name]. Date of Service: 07/14/2017 2:00 PM Medical Record Number: [id number] Patient Account Number: [id number] Date of Birth/Sex: 1930-11-10 (83 y.o. Female) Treating RN: Cornell Barman Primary Care Verdene Creson: [name] Other Clinician: Referring Keirston Saephanh: [name] Treating Excell Neyland/Extender: Frann Rider in Treatment: 56 Vascular Assessment Pulses: Dorsalis Pedis Palpable: [Left:Yes] Posterior Tibial Extremity colors, hair growth, and conditions: Extremity Color: [Left:Hyperpigmented] Hair Growth on Extremity: [Left:No] Temperature of Extremity: [Left:Warm] Capillary Refill: [Left:< 3 seconds] Dependent Rubor: [Left:No] Blanched when Elevated: [Left:No] Lipodermatosclerosis: [Left:No] Toe Nail Assessment Left: Right: Thick: Yes Discolored: Yes Deformed: Yes Improper Length and Hygiene: No Electronic Signature(s) Signed: 07/14/2017 4:44:24 PM By: Gretta Cool, BSN, RN, CWS, Kim RN, BSN Entered By: Gretta Cool, BSN, RN, CWS, Kim on 07/14/2017 14:03:09 [name] ([id number]) -------------------------------------------------------------------------------- Multi Wound Chart Details Patient Name: [name]. Date of Service: 07/14/2017 2:00 PM Medical Record Number: [id number] Patient Account Number: [id number] Date of Birth/Sex: 11-Apr-1931 (83 y.o. Female) Treating RN: Cornell Barman Primary Care  Amparo Donalson: [name] Other Clinician: Referring Ressie Slevin: [name] Treating Tijana Walder/Extender: Frann Rider in Treatment: 44 Vital Signs Height(in): 63 Pulse(bpm): 70 Weight(lbs): 160 Blood Pressure 163/68 (mmHg): Body Mass Index(BMI): 28 Temperature(F): 98.2 Respiratory Rate 14 (breaths/min): Photos: [2:No Photos] [N/A:N/A] Wound Location: [2:Left Calcaneus - Medial] [N/A:N/A] Wounding Event: [2:Pressure Injury] [N/A:N/A] Primary Etiology: [2:Pressure Ulcer] [N/A:N/A] Comorbid History: [2:Cataracts, Hypertension] [N/A:N/A] Date Acquired: [2:07/02/2016] [N/A:N/A] Weeks of Treatment: [2:44] [N/A:N/A] Wound Status: [2:Open] [N/A:N/A] Measurements L x W x D 1.5x1.4x0.1 [N/A:N/A] (cm) Area (cm) : [2:1.649] [N/A:N/A] Volume (cm) : [2:0.165] [N/A:N/A] % Reduction in Area: [2:91.70%] [N/A:N/A] % Reduction in Volume: 91.70% [N/A:N/A]  Classification: [2:Category/Stage III] [N/A:N/A] Exudate Amount: [2:Medium] [N/A:N/A] Exudate Type: [2:Serosanguineous] [N/A:N/A] Exudate Color: [2:red, brown] [N/A:N/A] Wound Margin: [2:Flat and Intact] [N/A:N/A] Granulation Amount: [2:Large (67-100%)] [N/A:N/A] Granulation Quality: [2:Red] [N/A:N/A] Necrotic Amount: [2:Small (1-33%)] [N/A:N/A] Exposed Structures: [2:Fat Layer (Subcutaneous Tissue) Exposed: Yes Fascia: No Tendon: No Muscle: No Joint: No Bone: No] [N/A:N/A] Epithelialization: [2:Medium (34-66%)] [N/A:N/A] Debridement: Debridement (12458- N/A N/A 11047) Pre-procedure 14:08 N/A N/A Verification/Time Out Taken: Pain Control: Other N/A N/A Tissue Debrided: Fibrin/Slough, N/A N/A Subcutaneous Level: Skin/Subcutaneous N/A N/A Tissue Debridement Area (sq 2.1 N/A N/A cm): Instrument: Curette N/A N/A Bleeding: Minimum N/A N/A Hemostasis Achieved: Pressure N/A N/A Procedural Pain: 0 N/A N/A Post Procedural Pain: 0 N/A N/A Debridement Treatment Procedure was tolerated N/A N/A Response: well Post  Debridement 1.5x1.4x0.1 N/A N/A Measurements L x W x D (cm) Post Debridement 0.165 N/A N/A Volume: (cm) Post Debridement Category/Stage III N/A N/A Stage: Periwound Skin Texture: Callus: Yes N/A N/A Excoriation: No Induration: No Crepitus: No Rash: No Scarring: No Periwound Skin Maceration: Yes N/A N/A Moisture: Dry/Scaly: No Periwound Skin Color: Atrophie Blanche: No N/A N/A Cyanosis: No Ecchymosis: No Erythema: No Hemosiderin Staining: No Mottled: No Pallor: No Rubor: No Tenderness on No N/A N/A Palpation: Wound Preparation: Ulcer Cleansing: N/A N/A Rinsed/Irrigated with Saline Topical Anesthetic [name], [name] ([id number]) Applied: Other: lidociane 4% Procedures Performed: Debridement N/A N/A Treatment Notes Electronic Signature(s) Signed: 07/14/2017 4:10:10 PM By: Christin Fudge MD, FACS Entered By: Christin Fudge on 07/14/2017 14:24:17 [name] ([id number]) -------------------------------------------------------------------------------- [place] Details Patient Name: [name] Date of Service: 07/14/2017 2:00 PM Medical Record Number: [id number] Patient Account Number: [id number] Date of Birth/Sex: 02/24/1931 (83 y.o. Female) Treating RN: Cornell Barman Primary Care Denora Wysocki: [name] Other Clinician: Referring Orrie Lascano: [name] Treating Quaniya Damas/Extender: Frann Rider in Treatment: 21 Active Inactive ` Abuse / Safety / Falls / Self Care Management Nursing Diagnoses: Impaired physical mobility Potential for falls Goals: Patient will remain injury free Date Initiated: 09/06/2016 Target Resolution Date: 12/02/2016 Goal Status: Active Interventions: Assess fall risk on admission and as needed Assess self care needs on admission and as needed Notes: ` Necrotic Tissue Nursing Diagnoses: Impaired tissue integrity related to necrotic/devitalized tissue Goals: Necrotic/devitalized tissue will be  minimized in the wound bed Date Initiated: 09/06/2016 Target Resolution Date: 12/02/2016 Goal Status: Active Interventions: Assess patient pain level pre-, during and post procedure and prior to discharge Notes: ` Orientation to the Wound Care Program Nursing Diagnoses: [name], [name] ([id number]) Knowledge deficit related to the wound healing center program Goals: Patient/caregiver will verbalize understanding of the [facility] Program Date Initiated: 09/06/2016 Target Resolution Date: 12/02/2016 Goal Status: Active Interventions: Provide education on orientation to the wound center Notes: ` Pressure Nursing Diagnoses: Knowledge deficit related to management of pressures ulcers Potential for impaired tissue integrity related to pressure, friction, moisture, and shear Goals: Patient will remain free of pressure ulcers Date Initiated: 09/06/2016 Target Resolution Date: 12/02/2016 Goal Status: Active Interventions: Assess: immobility, friction, shearing, incontinence upon admission and as needed Notes: ` Soft Tissue Infection Nursing Diagnoses: Impaired tissue integrity Potential for infection: soft tissue Goals: Patient will remain free of wound infection Date Initiated: 09/06/2016 Target Resolution Date: 12/02/2016 Goal Status: Active Interventions: Assess signs and symptoms of infection every visit Notes: ` Wound/Skin Impairment [name], [name] ([id number]) Nursing Diagnoses: Impaired tissue integrity Goals: Ulcer/skin breakdown will heal within 14 weeks Date Initiated: 09/06/2016 Target Resolution Date: 12/16/2016 Goal Status: Active Interventions: Assess ulceration(s) every visit  Notes: Electronic Signature(s) Signed: 07/14/2017 4:44:24 PM By: Gretta Cool, BSN, RN, CWS, Kim RN, BSN Entered By: Gretta Cool, BSN, RN, CWS, Kim on 07/14/2017 14:09:02 [name] ([id number]) -------------------------------------------------------------------------------- Pain  Assessment Details Patient Name: [name] Date of Service: 07/14/2017 2:00 PM Medical Record Number: [id number] Patient Account Number: [id number] Date of Birth/Sex: 1931-10-06 (84 y.o. Female) Treating RN: Cornell Barman Primary Care Heidee Audi: [name] Other Clinician: Referring Kessie Croston: [name] Treating Karene Bracken/Extender: Frann Rider in Treatment: 101 Active Problems Location of Pain Severity and Description of Pain Patient Has Paino No Site Locations With Dressing Change: No Pain Management and Medication Current Pain Management: Goals for Pain Management Topical or injectable lidocaine is offered to patient for acute pain when surgical debridement is performed. If needed, Patient is instructed to use over the counter pain medication for the following 24-48 hours after debridement. Wound care MDs do not prescribed pain medications. Patient has chronic pain or uncontrolled pain. Patient has been instructed to make an appointment with their Primary Care Physician for pain management. Electronic Signature(s) Signed: 07/14/2017 4:44:24 PM By: Gretta Cool, BSN, RN, CWS, Kim RN, BSN Entered By: Gretta Cool, BSN, RN, CWS, Kim on 07/14/2017 13:50:50 [name] ([id number]) -------------------------------------------------------------------------------- Patient/Caregiver Education Details Patient Name: [name] Date of Service: 07/14/2017 2:00 PM Medical Record Number: [id number] Patient Account Number: [id number] Date of Birth/Gender: 04/24/1931 (83 y.o. Female) Treating RN: Cornell Barman Primary Care Physician: [name] Other Clinician: Referring Physician: [name] Treating Physician/Extender: Frann Rider in Treatment: 19 Education Assessment Education Provided To: Patient Education Topics Provided Venous: Welcome [facility]: Wound/Skin Impairment: Handouts: Caring for Your Ulcer Methods: Demonstration Responses:  State content correctly Electronic Signature(s) Signed: 07/14/2017 4:44:24 PM By: Gretta Cool, BSN, RN, CWS, Kim RN, BSN Entered By: Gretta Cool, BSN, RN, CWS, Kim on 07/14/2017 14:31:01 [name] ([id number]) -------------------------------------------------------------------------------- Wound Assessment Details Patient Name: [name]. Date of Service: 07/14/2017 2:00 PM Medical Record Number: [id number] Patient Account Number: [id number] Date of Birth/Sex: Oct 05, 1931 (83 y.o. Female) Treating RN: Cornell Barman Primary Care Lalonnie Shaffer: [name] Other Clinician: Referring Ceanna Wareing: [name] Treating Analissa Bayless/Extender: Frann Rider in Treatment: 44 Wound Status Wound Number: 2 Primary Etiology: Pressure Ulcer Wound Location: Left Calcaneus - Medial Wound Status: Open Wounding Event: Pressure Injury Comorbid History: Cataracts, Hypertension Date Acquired: 07/02/2016 Weeks Of Treatment: 44 Clustered Wound: No Photos Photo Uploaded By: Gretta Cool, BSN, RN, CWS, Kim on 07/14/2017 16:24:22 Wound Measurements Length: (cm) 1.5 Width: (cm) 1.4 Depth: (cm) 0.1 Area: (cm) 1.649 Volume: (cm) 0.165 % Reduction in Area: 91.7% % Reduction in Volume: 91.7% Epithelialization: Medium (34-66%) Tunneling: No Undermining: No Wound Description Classification: Category/Stage III Foul Odor Afte Wound Margin: Flat and Intact Slough/Fibrino Exudate Amount: Medium Exudate Type: Serosanguineous Exudate Color: red, brown r Cleansing: No No Wound Bed Granulation Amount: Large (67-100%) Exposed Structure Granulation Quality: Red Fascia Exposed: No Necrotic Amount: Small (1-33%) Fat Layer (Subcutaneous Tissue) Exposed: Yes Necrotic Quality: Adherent Slough Tendon Exposed: No Muscle Exposed: No Joint Exposed: No Bone Exposed: No [name], Danielle S. ([id number]) Periwound Skin Texture Texture Color No Abnormalities Noted: No No Abnormalities Noted: No Callus: Yes Atrophie  Blanche: No Crepitus: No Cyanosis: No Excoriation: No Ecchymosis: No Induration: No Erythema: No Rash: No Hemosiderin Staining: No Scarring: No Mottled: No Pallor: No Moisture Rubor: No No Abnormalities Noted: No Dry / Scaly: No Maceration: Yes Wound Preparation Ulcer Cleansing: Rinsed/Irrigated with Saline Topical Anesthetic Applied: Other: lidociane 4%, Treatment Notes Wound #2 (Left, Medial  Calcaneus) 1. Cleansed with: Clean wound with Normal Saline 2. Anesthetic Topical Lidocaine 4% cream to wound bed prior to debridement 4. Dressing Applied: Other dressing (specify in notes) 5. Secondary Dressing Applied ABD and Kerlix/Conform 6. Footwear/Offloading device applied Other footwear/offloading device applied (specify in notes) 7. Secured with Paper tape Notes Polymem [employer]; Best boy) Signed: 07/14/2017 4:44:24 PM By: Gretta Cool, BSN, RN, CWS, Kim RN, BSN Entered By: Gretta Cool, BSN, RN, CWS, Kim on 07/14/2017 14:02:02 [name] ([id number]) -------------------------------------------------------------------------------- Vitals Details Patient Name: [name] Date of Service: 07/14/2017 2:00 PM Medical Record Number: [id number] Patient Account Number: [id number] Date of Birth/Sex: Oct 03, 1931 (83 y.o. Female) Treating RN: Cornell Barman Primary Care Crisanto Nied: [name] Other Clinician: Referring Janeene Sand: [name] Treating Jaquavion Mccannon/Extender: Frann Rider in Treatment: 24 Vital Signs Time Taken: 13:50 Temperature (F): 98.2 Height (in): 63 Pulse (bpm): 70 Weight (lbs): 160 Respiratory Rate (breaths/min): 14 Body Mass Index (BMI): 28.3 Blood Pressure (mmHg): 163/68 Reference Range: 80 - 120 mg / dl Electronic Signature(s) Signed: 07/14/2017 4:44:24 PM By: Gretta Cool, BSN, RN, CWS, Kim RN, BSN Entered By: Gretta Cool, BSN, RN, CWS, Kim on 07/14/2017 13:51:12

## 2017-07-27 ENCOUNTER — Ambulatory Visit (INDEPENDENT_AMBULATORY_CARE_PROVIDER_SITE_OTHER): Payer: Medicare HMO | Admitting: Urology

## 2017-07-27 DIAGNOSIS — R339 Retention of urine, unspecified: Secondary | ICD-10-CM | POA: Diagnosis not present

## 2017-07-27 MED ORDER — CIPROFLOXACIN HCL 500 MG PO TABS: 500.0000 mg | ORAL_TABLET | Freq: Once | ORAL | Status: DC

## 2017-07-27 NOTE — Progress Notes (Signed)
   07/27/17  CC:  Chief Complaint  Patient presents with  . 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 Her suprapubic tube was originally placed in 07/2016.  There were no vitals taken for this visit. NED. A&Ox3.   No respiratory distress   Abd soft, NT, ND Normal external genitalia with patent urethral meatus  Cystoscopy Procedure Note  Patient identification was confirmed, informed consent was obtained, and patient was prepped using Betadine solution.  Lidocaine jelly was administered per SPT tract.    Preoperative abx where received prior to procedure.    Procedure: - Flexible cystoscope introduced, without any difficulty.   - Thorough search of the bladder revealed:    normal urothelium    no stones    no ulcers     no tumors    no urethral polyps    no trabeculation    Bladder neck and open confirmation  - Ureteral orifices were normal in position and appearance.  Post-Procedure: - Patient tolerated the procedure well  Following the procedure, her suprapubic tube site was reprepped. A 16 French Foley catheter was then inserted without difficulty with return of clear irrigant fluid. The balloon was filled with 10 cc of sterile water.  Assessment/ Plan:  1. Urinary retention S/p annual cysto, will be due again 07/2018 Continue monthly SPT exchanges - ciprofloxacin (CIPRO) tablet 500 mg; Take 1 tablet (500 mg total) by mouth once.   Hollice Espy, MD

## 2017-07-28 ENCOUNTER — Encounter: Payer: Medicare HMO | Attending: Surgery | Admitting: Surgery

## 2017-07-28 DIAGNOSIS — D649 Anemia, unspecified: Secondary | ICD-10-CM | POA: Insufficient documentation

## 2017-07-28 DIAGNOSIS — N189 Chronic kidney disease, unspecified: Secondary | ICD-10-CM | POA: Insufficient documentation

## 2017-07-28 DIAGNOSIS — I129 Hypertensive chronic kidney disease with stage 1 through stage 4 chronic kidney disease, or unspecified chronic kidney disease: Secondary | ICD-10-CM | POA: Insufficient documentation

## 2017-07-28 DIAGNOSIS — L89623 Pressure ulcer of left heel, stage 3: Secondary | ICD-10-CM | POA: Insufficient documentation

## 2017-07-28 DIAGNOSIS — L89613 Pressure ulcer of right heel, stage 3: Secondary | ICD-10-CM | POA: Diagnosis not present

## 2017-07-28 DIAGNOSIS — Z993 Dependence on wheelchair: Secondary | ICD-10-CM | POA: Insufficient documentation

## 2017-07-30 NOTE — Progress Notes (Signed)
REDELL, BHANDARI ([id number]) Visit Report for 07/28/2017 Arrival Information Details Patient Name: [name], [name]. Date of Service: 07/28/2017 1:45 PM Medical Record Number: [id number] Patient Account Number: [id number] Date of Birth/Sex: [birth year] (83 y.o. Female) Treating RN: Carolyne Fiscal, Debi Primary Care Corneluis Allston: [name] Other Clinician: Referring Tenise Stetler: [name] Treating Savvy Peeters/Extender: Frann Rider in Treatment: 23 Visit Information History Since Last Visit All ordered tests and consults were completed: No Patient Arrived: Wheel Chair Added or deleted any medications: No Arrival Time: 13:44 Any new allergies or adverse reactions: No Accompanied By: self Had a fall or experienced change in No activities of daily living that may affect Transfer Assistance: Harrel Lemon Lift risk of falls: Patient Identification Verified: Yes Signs or symptoms of abuse/neglect since last No Secondary Verification Process Yes visito Completed: Hospitalized since last visit: No Patient Requires Transmission-Based No Has Dressing in Place as Prescribed: Yes Precautions: Pain Present Now: No Patient Has Alerts: No Electronic Signature(s) Signed: 07/29/2017 9:47:23 AM By: Alric Quan Entered By: Alric Quan on 07/28/2017 13:48:26 Maione, [name] ([id number]) -------------------------------------------------------------------------------- Clinic Level of Care Assessment Details Patient Name: [name] Date of Service: 07/28/2017 1:45 PM Medical Record Number: [id number] Patient Account Number: [id number] Date of Birth/Sex: [birth year] (83 y.o. Female) Treating RN: Carolyne Fiscal, Debi Primary Care Nina Hoar: [name] Other Clinician: Referring Aariyana Manz: [name] Treating Hairo Garraway/Extender: Frann Rider in Treatment: 46 Clinic Level of Care Assessment Items TOOL 4 Quantity Score X - Use when only an EandM is performed on FOLLOW-UP  visit 1 0 ASSESSMENTS - Nursing Assessment / Reassessment X - Reassessment of Co-morbidities (includes updates in patient status) 1 10 X - Reassessment of Adherence to Treatment Plan 1 5 ASSESSMENTS - Wound and Skin Assessment / Reassessment X - Simple Wound Assessment / Reassessment - one wound 1 5 []  - Complex Wound Assessment / Reassessment - multiple wounds 0 []  - Dermatologic / Skin Assessment (not related to wound area) 0 ASSESSMENTS - Focused Assessment []  - Circumferential Edema Measurements - multi extremities 0 []  - Nutritional Assessment / Counseling / Intervention 0 []  - Lower Extremity Assessment (monofilament, tuning fork, pulses) 0 []  - Peripheral Arterial Disease Assessment (using hand held doppler) 0 ASSESSMENTS - Ostomy and/or Continence Assessment and Care []  - Incontinence Assessment and Management 0 []  - Ostomy Care Assessment and Management (repouching, etc.) 0 PROCESS - Coordination of Care []  - Simple Patient / Family Education for ongoing care 0 X - Complex (extensive) Patient / Family Education for ongoing care 1 20 X - Staff obtains Programmer, systems, Records, Test Results / Process Orders 1 10 X - Staff telephones HHA, Nursing Homes / Clarify orders / etc 1 10 []  - Routine Transfer to another Facility (non-emergent condition) 0 TANISHIA, LEMASTER. ([id number]) []  - Routine Hospital Admission (non-emergent condition) 0 []  - New Admissions / Biomedical engineer / Ordering NPWT, Apligraf, etc. 0 []  - Emergency Hospital Admission (emergent condition) 0 X - Simple Discharge Coordination 1 10 []  - Complex (extensive) Discharge Coordination 0 PROCESS - Special Needs []  - Pediatric / Minor Patient Management 0 []  - Isolation Patient Management 0 []  - Hearing / Language / Visual special needs 0 []  - Assessment of Community assistance (transportation, D/C planning, etc.) 0 []  - Additional assistance / Altered mentation 0 []  - Support Surface(s) Assessment (bed, cushion,  seat, etc.) 0 INTERVENTIONS - Wound Cleansing / Measurement X - Simple Wound Cleansing - one wound 1 5 []  - Complex Wound Cleansing - multiple wounds 0 X -  Wound Imaging (photographs - any number of wounds) 1 5 []  - Wound Tracing (instead of photographs) 0 X - Simple Wound Measurement - one wound 1 5 []  - Complex Wound Measurement - multiple wounds 0 INTERVENTIONS - Wound Dressings X - Small Wound Dressing one or multiple wounds 1 10 []  - Medium Wound Dressing one or multiple wounds 0 []  - Large Wound Dressing one or multiple wounds 0 X - Application of Medications - topical 1 5 []  - Application of Medications - injection 0 INTERVENTIONS - Miscellaneous []  - External ear exam 0 Gradilla, Merritt S. ([id number]) []  - Specimen Collection (cultures, biopsies, blood, body fluids, etc.) 0 []  - Specimen(s) / Culture(s) sent or taken to Lab for analysis 0 X - Patient Transfer (multiple staff / Harrel Lemon Lift / Similar devices) 1 10 []  - Simple Staple / Suture removal (25 or less) 0 []  - Complex Staple / Suture removal (26 or more) 0 []  - Hypo / Hyperglycemic Management (close monitor of Blood Glucose) 0 []  - Ankle / Brachial Index (ABI) - do not check if billed separately 0 X - Vital Signs 1 5 Has the patient been seen at the hospital within the last three years: Yes Total Score: 115 Level Of Care: New/Established - Level 3 Electronic Signature(s) Signed: 07/29/2017 9:47:23 AM By: Alric Quan Entered By: Alric Quan on 07/28/2017 14:28:55 [name] ([id number]) -------------------------------------------------------------------------------- Encounter Discharge Information Details Patient Name: [name]. Date of Service: 07/28/2017 1:45 PM Medical Record Number: [id number] Patient Account Number: [id number] Date of Birth/Sex: 31-Aug-1931 (83 y.o. Female) Treating RN: Cornell Barman Primary Care Sasuke Yaffe: [name] Other Clinician: Referring Wyn Nettle: [name] Treating Lashaun Poch/Extender: Frann Rider in Treatment: 30 Encounter Discharge Information Items Discharge Pain Level: 0 Discharge Condition: Stable Ambulatory Status: Wheelchair Discharge Destination: Home Transportation: Private Auto Accompanied By: self Schedule Follow-up Appointment: Yes Medication Reconciliation completed and provided to Patient/Care No Amee Boothe: Provided on Clinical Summary of Care: 07/28/2017 Form Type Recipient Paper Patient EB Electronic Signature(s) Signed: 07/29/2017 9:47:23 AM By: Alric Quan Entered By: Alric Quan on 07/28/2017 14:26:51 [name] ([id number]) -------------------------------------------------------------------------------- Lower Extremity Assessment Details Patient Name: [name]. Date of Service: 07/28/2017 1:45 PM Medical Record Number: [id number] Patient Account Number: [id number] Date of Birth/Sex: [birth year] (83 y.o. Female) Treating RN: Carolyne Fiscal, Debi Primary Care Beatryce Colombo: [name] Other Clinician: Referring Dallin Mccorkel: [name] Treating Rider Ermis/Extender: Frann Rider in Treatment: 46 Vascular Assessment Pulses: Dorsalis Pedis Palpable: [Left:Yes] Posterior Tibial Extremity colors, hair growth, and conditions: Extremity Color: [Left:Hyperpigmented] Hair Growth on Extremity: [Left:No] Temperature of Extremity: [Left:Warm] Capillary Refill: [Left:< 3 seconds] Toe Nail Assessment Left: Right: Thick: Yes Discolored: Yes Deformed: Yes Improper Length and Hygiene: No Electronic Signature(s) Signed: 07/29/2017 9:47:23 AM By: Alric Quan Entered By: Alric Quan on 07/28/2017 14:01:45 [name] ([id number]) -------------------------------------------------------------------------------- Multi Wound Chart Details Patient Name: [name]. Date of Service: 07/28/2017 1:45 PM Medical Record Number: [id number] Patient Account Number:  [id number] Date of Birth/Sex: 11-21-1930 (83 y.o. Female) Treating RN: Carolyne Fiscal, Debi Primary Care Johnnetta Holstine: [name] Other Clinician: Referring Melodee Lupe: [name] Treating Mael Delap/Extender: Frann Rider in Treatment: 46 Vital Signs Height(in): 63 Pulse(bpm): 67 Weight(lbs): 160 Blood Pressure 152/55 (mmHg): Body Mass Index(BMI): 28 Temperature(F): 98.3 Respiratory Rate 16 (breaths/min): Photos: [2:No Photos] [N/A:N/A] Wound Location: [2:Left, Medial Calcaneus] [N/A:N/A] Wounding Event: [2:Pressure Injury] [N/A:N/A] Primary Etiology: [2:Pressure Ulcer] [N/A:N/A] Date Acquired: [2:07/02/2016] [N/A:N/A] Weeks of Treatment: [2:46] [N/A:N/A] Wound Status: [2:Open] [N/A:N/A] Measurements L x W x  D 2x1x0.1 [N/A:N/A] (cm) Area (cm) : [2:1.571] [N/A:N/A] Volume (cm) : [2:0.157] [N/A:N/A] % Reduction in Area: [2:92.10%] [N/A:N/A] % Reduction in Volume: 92.10% [N/A:N/A] Classification: [2:Category/Stage III] [N/A:N/A] Periwound Skin Texture: No Abnormalities Noted [N/A:N/A] Periwound Skin [2:No Abnormalities Noted] [N/A:N/A] Moisture: Periwound Skin Color: No Abnormalities Noted [N/A:N/A] Tenderness on [2:No] [N/A:N/A] Treatment Notes Electronic Signature(s) Signed: 07/28/2017 2:08:23 PM By: Christin Fudge MD, FACS Entered By: Christin Fudge on 07/28/2017 14:08:22 [name] ([id number]) -------------------------------------------------------------------------------- [place] Details Patient Name: [name]. Date of Service: 07/28/2017 1:45 PM Medical Record Number: [id number] Patient Account Number: [id number] Date of Birth/Sex: [birth year] (83 y.o. Female) Treating RN: Carolyne Fiscal, Debi Primary Care Diandra Cimini: [name] Other Clinician: Referring Chantelle Verdi: [name] Treating Cylan Borum/Extender: Frann Rider in Treatment: 42 Active Inactive ` Abuse / Safety / Falls / Self Care Management Nursing  Diagnoses: Impaired physical mobility Potential for falls Goals: Patient will remain injury free Date Initiated: 09/06/2016 Target Resolution Date: 12/02/2016 Goal Status: Active Interventions: Assess fall risk on admission and as needed Assess self care needs on admission and as needed Notes: ` Necrotic Tissue Nursing Diagnoses: Impaired tissue integrity related to necrotic/devitalized tissue Goals: Necrotic/devitalized tissue will be minimized in the wound bed Date Initiated: 09/06/2016 Target Resolution Date: 12/02/2016 Goal Status: Active Interventions: Assess patient pain level pre-, during and post procedure and prior to discharge Notes: ` Orientation to the Wound Care Program Nursing Diagnoses: ASHONTI, LEANDRO ([id number]) Knowledge deficit related to the wound healing center program Goals: Patient/caregiver will verbalize understanding of the [place] Program Date Initiated: 09/06/2016 Target Resolution Date: 12/02/2016 Goal Status: Active Interventions: Provide education on orientation to the wound center Notes: ` Pressure Nursing Diagnoses: Knowledge deficit related to management of pressures ulcers Potential for impaired tissue integrity related to pressure, friction, moisture, and shear Goals: Patient will remain free of pressure ulcers Date Initiated: 09/06/2016 Target Resolution Date: 12/02/2016 Goal Status: Active Interventions: Assess: immobility, friction, shearing, incontinence upon admission and as needed Notes: ` Soft Tissue Infection Nursing Diagnoses: Impaired tissue integrity Potential for infection: soft tissue Goals: Patient will remain free of wound infection Date Initiated: 09/06/2016 Target Resolution Date: 12/02/2016 Goal Status: Active Interventions: Assess signs and symptoms of infection every visit Notes: ` Wound/Skin Impairment DEBARAH, MCCUMBERS ([id number]) Nursing Diagnoses: Impaired tissue  integrity Goals: Ulcer/skin breakdown will heal within 14 weeks Date Initiated: 09/06/2016 Target Resolution Date: 12/16/2016 Goal Status: Active Interventions: Assess ulceration(s) every visit Notes: Electronic Signature(s) Signed: 07/29/2017 9:47:23 AM By: Alric Quan Entered By: Alric Quan on 07/28/2017 14:03:03 [name] ([id number]) -------------------------------------------------------------------------------- Pain Assessment Details Patient Name: [name]. Date of Service: 07/28/2017 1:45 PM Medical Record Number: [id number] Patient Account Number: [id number] Date of Birth/Sex: May 22, 1931 (83 y.o. Female) Treating RN: Carolyne Fiscal, Debi Primary Care Waleed Dettman: [name] Other Clinician: Referring Plez Belton: [name] Treating Olivya Sobol/Extender: Frann Rider in Treatment: 46 Active Problems Location of Pain Severity and Description of Pain Patient Has Paino No Site Locations Pain Management and Medication Current Pain Management: Electronic Signature(s) Signed: 07/29/2017 9:47:23 AM By: Alric Quan Entered By: Alric Quan on 07/28/2017 13:48:32 [name] ([id number]) -------------------------------------------------------------------------------- Patient/Caregiver Education Details Patient Name: [name]. Date of Service: 07/28/2017 1:45 PM Medical Record Number: [id number] Patient Account Number: [id number] Date of Birth/Gender: 08/21/1931 (83 y.o. Female) Treating RN: Ahmed Prima Primary Care Physician: [name] Other Clinician: Referring Physician: [name] Treating Physician/Extender: Frann Rider in Treatment: 50 Education Assessment Education Provided To: Patient Education Topics Provided  Wound/Skin Impairment: Handouts: Other: change dressing as ordered Methods: Demonstration, Explain/Verbal Responses: State content correctly Electronic Signature(s) Signed:  07/29/2017 9:47:23 AM By: Alric Quan Entered By: Alric Quan on 07/28/2017 14:27:03 [name] ([id number]) -------------------------------------------------------------------------------- Wound Assessment Details Patient Name: [name]. Date of Service: 07/28/2017 1:45 PM Medical Record Number: [id number] Patient Account Number: [id number] Date of Birth/Sex: Dec 06, 1930 (83 y.o. Female) Treating RN: Carolyne Fiscal, Debi Primary Care Duc Crocket: [name] Other Clinician: Referring Denean Pavon: [name] Treating Jahdiel Krol/Extender: Frann Rider in Treatment: 46 Wound Status Wound Number: 2 Primary Etiology: Pressure Ulcer Wound Location: Left, Medial Calcaneus Wound Status: Open Wounding Event: Pressure Injury Date Acquired: 07/02/2016 Weeks Of Treatment: 46 Clustered Wound: No Photos Photo Uploaded By: Alric Quan on 07/29/2017 09:50:07 Wound Measurements Length: (cm) 2 Width: (cm) 1 Depth: (cm) 0.1 Area: (cm) 1.571 Volume: (cm) 0.157 % Reduction in Area: 92.1% % Reduction in Volume: 92.1% Wound Description Classification: Category/Stage III Periwound Skin Texture Texture Color No Abnormalities Noted: No No Abnormalities Noted: No Moisture No Abnormalities Noted: No Treatment Notes Wound #2 (Left, Medial Calcaneus) 1. Cleansed with: [name] ([id number]) Clean wound with Normal Saline 2. Anesthetic Topical Lidocaine 4% cream to wound bed prior to debridement 4. Dressing Applied: Other dressing (specify in notes) 5. Secondary Dressing Applied ABD and Kerlix/Conform 6. Footwear/Offloading device applied Other footwear/offloading device applied (specify in notes) 7. 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 Female) Treating RN: Carolyne Fiscal, Debi Primary Care Zavannah Deblois: [name] Other Clinician: Referring Corinthian Kemler: [name] Treating Jencarlo Bonadonna/Extender: Frann Rider in Treatment: 46 Vital Signs Time Taken: 13:48 Temperature (F): 98.3 Height (in): 63 Pulse (bpm): 67 Weight (lbs): 160 Respiratory Rate (breaths/min): 16 Body Mass Index (BMI): 28.3 Blood Pressure (mmHg): 152/55 Reference Range: 80 - 120 mg / dl Electronic Signature(s) Signed: 07/29/2017 9:47:23 AM By: Alric Quan Entered By: Alric Quan on 07/28/2017 13:48:58

## 2017-07-30 NOTE — Progress Notes (Signed)
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 when for other reasons). If current dressing causes regression in wound condition, may D/C ordered dressing product/s and apply Normal Saline Moist Dressing daily until next [place] / Other MD appointment. [place] of regression in wound condition at [phone]. Please direct any NON-WOUND related issues/requests for orders to patient's Primary Care Physician After review today I have recommended: 1. Polymem silver be applied to left heel and this can be changed every other day with an appropriate offloading technique. 2. Continue with adequate protein, vitamin A, vitamin C and zinc 3. Regular visits to the wound center -- every week has been stressed Electronic Signature(s) Signed: 07/29/2017 8:13:15 AM By: Christin Fudge MD, FACS Previous Signature: 07/28/2017 2:10:47 PM Version By: Christin Fudge MD, FACS Entered By: Christin Fudge on 07/29/2017 08:13:15 [name] ([id number]) -------------------------------------------------------------------------------- SuperBill Details Patient Name: [name]. Date of Service: 07/28/2017 Medical Record Number: [id number] Patient Account Number: [id number] Date of Birth/Sex: May 05, 1931 (83 y.o.  Female) Treating RN: Cornell Barman Primary Care Provider: [name] Other Clinician: Referring Provider: [name] Treating Provider/Extender: Frann Rider in Treatment: 46 Diagnosis Coding ICD-10 Codes Code Description [phone] Pressure ulcer of left heel, stage 3 L89.613 Pressure ulcer of right heel, stage 3 Z99.3 Dependence on wheelchair Facility Procedures CPT4 Code: [id number] Description: 99213 - WOUND CARE VISIT-LEV 3 EST PT Modifier: Quantity: 1 Physician Procedures CPT4 Code: [id number] Description: 98264 - WC PHYS LEVEL 3 - EST PT ICD-10 Description Diagnosis L89.623 Pressure ulcer of left heel, stage 3 L89.613 Pressure ulcer of right heel, stage 3 Z99.3 Dependence on wheelchair Modifier: Quantity: 1 Electronic Signature(s) Signed: 07/29/2017 8:14:00 AM By: Christin Fudge MD, FACS Previous Signature: 07/28/2017 2:11:12 PM Version By: Christin Fudge MD, FACS Entered By: Christin Fudge on 07/29/2017 08:13:59

## 2017-08-04 ENCOUNTER — Encounter: Payer: Medicare HMO | Admitting: Surgery

## 2017-08-11 ENCOUNTER — Encounter: Payer: Medicare HMO | Admitting: Surgery

## 2017-08-11 DIAGNOSIS — L89623 Pressure ulcer of left heel, stage 3: Secondary | ICD-10-CM | POA: Diagnosis not present

## 2017-08-12 ENCOUNTER — Ambulatory Visit: Payer: Medicare HMO

## 2017-08-15 NOTE — Progress Notes (Signed)
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 and Subcutaneous after achieving pain control using Other (lidocaine 4%). A time out was conducted at 14:25, prior to the start of the procedure. A Minimum amount of bleeding was controlled with Pressure. The procedure was tolerated well with a pain level of 0 throughout and a pain level of 0 following the procedure. Post Debridement Measurements: 0.7cm length x 0.5cm width x 0.1cm depth; 0.027cm^3 volume. Post debridement Stage noted as Category/Stage III. Character of Wound/Ulcer Post Debridement is stable. Post procedure Diagnosis Wound #2: Same as Pre-Procedure Plan Wound Cleansing: Wound #2 Left,Medial Calcaneus: Clean wound with Normal Saline. Anesthetic: Wound #2 Left,Medial Calcaneus: Topical Lidocaine 4% cream applied to wound bed prior to debridement - In clinic only Primary Wound Dressing: Wound #2 Left,Medial Calcaneus: Other: - Polymem Ag Secondary Dressing: Wound #2 Left,Medial Calcaneus: ABD and Kerlix/Conform Dressing Change Frequency: Wound #2 Left,Medial Calcaneus: Change dressing every other day. Follow-up Appointments: Wound #2 Left,Medial Calcaneus: Return Appointment in 2 weeks. Edema Control: Wound #2 Left,Medial  Calcaneus: Elevate legs to the level of the heart and pump ankles as often as possible Off-Loading: Wound #2 Left,Medial Calcaneus: Other: - No pressure on heels!!! Float heel while in chair or bed. Sage boots at night along with floating heels. Additional Orders / Instructions: Wound #2 Left,Medial Calcaneus: Increase protein intake. Other: - Vitamins A, C and Zinc Home Health: Wound #2 Left,Medial Calcaneus: Continue Home Health Visits - [place] Nurse may visit PRN to address patient s wound care needs. FACE TO FACE ENCOUNTER: MEDICARE and MEDICAID PATIENTS: I certify that this patient is under my care and that I had a face-to-face encounter that meets the physician face-to-face encounter requirements with this patient on this date. The encounter with the patient was in whole or in part for the following MEDICAL CONDITION: (primary reason for [place]) MEDICAL NECESSITY: I certify, that based on my findings, NURSING services are a medically necessary home health service. HOME BOUND STATUS: I certify that my clinical findings support that this patient is homebound (i.e., Due to illness or injury, pt requires aid of supportive devices such as crutches, cane, wheelchairs, walkers, the use of special transportation or the assistance of another person to leave their place of residence. There is a normal inability to leave the home and doing so requires considerable and taxing effort. Other absences are for medical reasons / religious services and ADELHEID, HOGGARD. ([id number]) are infrequent or of short duration when for other reasons). If current dressing causes regression in wound condition, may D/C ordered dressing product/s and apply Normal Saline Moist Dressing daily until next [place] / Other MD appointment. [place] of regression in wound condition at [phone]. Please direct any NON-WOUND related issues/requests for orders to  patient's Primary Care Physician After sharp debridement and review today I have recommended: 1. Polymem silver be applied to left heel and this can be changed every other day with an appropriate offloading technique. 2. Continue with adequate protein, vitamin A, vitamin C and zinc 3. Regular visits to the wound center --  every week has been stressed Electronic Signature(s) Signed: 08/11/2017 2:43:53 PM By: Christin Fudge MD, FACS Entered By: Christin Fudge on 08/11/2017 14:43:53 [name] ([id number]) -------------------------------------------------------------------------------- SuperBill Details Patient Name: [name] Date of Service: 08/11/2017 Medical Record Number: [id number] Patient Account Number: [id number] Date of Birth/Sex: December 31, 1930 (83 y.o. 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

## 2017-08-15 NOTE — Progress Notes (Signed)
Danielle, Zuniga ([id number]) Visit Report for 08/11/2017 Arrival Information Details Patient Name: Danielle, Zuniga. Date of Service: 08/11/2017 1:45 PM Medical Record Number: [id number] Patient Account Number: [id number] Date of Birth/Sex: 1930-11-08 (83 y.o. Female) Treating RN: Cornell Barman Primary Care Samarie Pinder: [name] Other Clinician: Referring Jennalyn Cawley: [name] Treating Kasen Adduci/Extender: Frann Rider in Treatment: 66 Visit Information History Since Last Visit Added or deleted any medications: No Patient Arrived: Wheel Chair Any new allergies or adverse reactions: No Arrival Time: 13:59 Had a fall or experienced change in No activities of daily living that may affect Accompanied By: self risk of falls: Transfer Assistance: [employer] Signs or symptoms of abuse/neglect since last visito No Patient Identification Verified: Yes Hospitalized since last visit: No Secondary Verification Process Completed: Yes Has Dressing in Place as Prescribed: Yes Patient Requires Transmission-Based No Pain Present Now: No Precautions: Patient Has Alerts: No Electronic Signature(s) Signed: 08/11/2017 4:41:15 PM By: Gretta Cool, BSN, RN, CWS, Kim RN, BSN Entered By: Gretta Cool, BSN, RN, CWS, Kim on 08/11/2017 14:00:24 [name] ([id number]) -------------------------------------------------------------------------------- Encounter Discharge Information Details Patient Name: [name]. Date of Service: 08/11/2017 1:45 PM Medical Record Number: [id number] Patient Account Number: [id number] Date of Birth/Sex: [birth year] (83 y.o. Female) Treating RN: Cornell Barman Primary Care Jaquavious Mercer: [name] Other Clinician: Referring Dealva Lafoy: [name] Treating Donnah Levert/Extender: Frann Rider in Treatment: 75 Encounter Discharge Information Items Discharge Pain Level: 0 Discharge Condition: Stable Ambulatory Status: Wheelchair Discharge Destination:  Home Transportation: Private Auto Accompanied By: self Schedule Follow-up Appointment: Yes Medication Reconciliation completed and Yes provided to Patient/Care Jediah Horger: Provided on Clinical Summary of Care: 08/11/2017 Form Type Recipient Paper Patient EB Electronic Signature(s) Signed: 08/11/2017 4:31:41 PM By: Ruthine Dose Previous Signature: 08/11/2017 2:37:12 PM Version By: Gretta Cool, BSN, RN, CWS, Kim RN, BSN Entered By: Ruthine Dose on 08/11/2017 14:38:39 [name] ([id number]) -------------------------------------------------------------------------------- Lower Extremity Assessment Details Patient Name: [name]. Date of Service: 08/11/2017 1:45 PM Medical Record Number: [id number] Patient Account Number: [id number] Date of Birth/Sex: [birth year] (83 y.o. Female) Treating RN: Cornell Barman Primary Care Tara Rud: [name] Other Clinician: Referring Kevan Prouty: [name] Treating Talli Kimmer/Extender: Frann Rider in Treatment: 48 Vascular Assessment Pulses: Dorsalis Pedis Palpable: [Left:Yes] Posterior Tibial Extremity colors, hair growth, and conditions: Extremity Color: [Left:Normal] Hair Growth on Extremity: [Left:No] Temperature of Extremity: [Left:Warm] Capillary Refill: [Left:< 3 seconds] Toe Nail Assessment Left: Right: Thick: No Discolored: Yes Deformed: No Improper Length and Hygiene: No Electronic Signature(s) Signed: 08/11/2017 4:41:15 PM By: Gretta Cool, BSN, RN, CWS, Kim RN, BSN Entered By: Gretta Cool, BSN, RN, CWS, Kim on 08/11/2017 14:12:30 [name] ([id number]) -------------------------------------------------------------------------------- Multi Wound Chart Details Patient Name: [name]. Date of Service: 08/11/2017 1:45 PM Medical Record Number: [id number] Patient Account Number: [id number] Date of Birth/Sex: 06-Sep-1931 (83 y.o. Female) Treating RN: Cornell Barman Primary Care Natali Lavallee: [name] Other  Clinician: Referring Evelisse Szalkowski: [name] Treating Takesha Steger/Extender: Frann Rider in Treatment: 48 Vital Signs Height(in): 63 Pulse(bpm): 59 Weight(lbs): 160 Blood Pressure(mmHg): 174/70 Body Mass Index(BMI): 28 Temperature(F): 98.2 Respiratory Rate 16 (breaths/min): Photos: [N/A:N/A] Wound Location: Left Calcaneus - Medial N/A N/A Wounding Event: Pressure Injury N/A N/A Primary Etiology: Pressure Ulcer N/A N/A Comorbid History: Cataracts, Hypertension N/A N/A Date Acquired: 07/02/2016 N/A N/A Weeks of Treatment: 48 N/A N/A Wound Status: Open N/A N/A Measurements L x W x D 0.7x0.5x0.1 N/A N/A (cm) Area (cm) : 0.275 N/A N/A Volume (cm) : 0.027 N/A N/A % Reduction in Area: 98.60% N/A N/A %  Reduction in Volume: 98.60% N/A N/A Classification: Category/Stage III N/A N/A Exudate Amount: None Present N/A N/A Wound Margin: Flat and Intact N/A N/A Granulation Amount: Large (67-100%) N/A N/A Exposed Structures: Fascia: No N/A N/A Fat Layer (Subcutaneous Tissue) Exposed: No Tendon: No Muscle: No Joint: No Bone: No Limited to Skin Breakdown Epithelialization: Medium (34-66%) N/A N/A Debridement: Open Wound/Selective N/A N/A ([id number]) - Selective Pre-procedure 14:25 N/A N/A Verification/Time Out Taken: Pain Control: Other N/A N/A Danielle, Zuniga ([id number]) Tissue Debrided: Fibrin/Slough, Callus N/A N/A Level: Non-Viable Tissue N/A N/A Debridement Area (sq cm): 0.35 N/A N/A Instrument: Curette N/A N/A Bleeding: Minimum N/A N/A Hemostasis Achieved: Pressure N/A N/A Procedural Pain: 0 N/A N/A Post Procedural Pain: 0 N/A N/A Debridement Treatment Procedure was tolerated well N/A N/A Response: Post Debridement 0.7x0.5x0.1 N/A N/A Measurements L x W x D (cm) Post Debridement Volume: 0.027 N/A N/A (cm) Post Debridement Stage: Category/Stage III N/A N/A Periwound Skin Texture: Callus: Yes N/A N/A Scarring: Yes Excoriation: No Induration:  No Crepitus: No Rash: No Periwound Skin Moisture: Maceration: No N/A N/A Dry/Scaly: No Periwound Skin Color: Atrophie Blanche: No N/A N/A Cyanosis: No Ecchymosis: No Erythema: No Hemosiderin Staining: No Mottled: No Pallor: No Rubor: No Tenderness on Palpation: No N/A N/A Wound Preparation: Ulcer Cleansing: N/A N/A Rinsed/Irrigated with Saline Topical Anesthetic Applied: Other: lidocaine 4% Procedures Performed: Debridement N/A N/A Treatment Notes Wound #2 (Left, Medial Calcaneus) 1. Cleansed with: Clean wound with Normal Saline 2. Anesthetic Topical Lidocaine 4% cream to wound bed prior to debridement 4. Dressing Applied: Other dressing (specify in notes) 5. Secondary Dressing Applied ABD and Kerlix/Conform Notes Polymem [employer]; [employer]) Danielle, Zuniga ([id number]) Signed: 08/11/2017 2:41:42 PM By: Christin Fudge MD, FACS Previous Signature: 08/11/2017 2:33:53 PM Version By: Gretta Cool BSN, RN, CWS, Kim RN, BSN Entered By: Christin Fudge on 08/11/2017 14:41:42 [name] ([id number]) -------------------------------------------------------------------------------- [place] Details Patient Name: [name] Date of Service: 08/11/2017 1:45 PM Medical Record Number: [id number] Patient Account Number: [id number] Date of Birth/Sex: 08-26-1931 (83 y.o. Female) Treating RN: Cornell Barman Primary Care Caren Garske: [name] Other Clinician: Referring Jazmyn Offner: [name] Treating Julieanne Hadsall/Extender: Frann Rider in Treatment: 19 Active Inactive ` Abuse / Safety / Falls / Self Care Management Nursing Diagnoses: Impaired physical mobility Potential for falls Goals: Patient will remain injury free Date Initiated: 09/06/2016 Target Resolution Date: 12/02/2016 Goal Status: Active Interventions: Assess fall risk on admission and as needed Assess self care needs on admission and as needed Notes: ` Necrotic  Tissue Nursing Diagnoses: Impaired tissue integrity related to necrotic/devitalized tissue Goals: Necrotic/devitalized tissue will be minimized in the wound bed Date Initiated: 09/06/2016 Target Resolution Date: 12/02/2016 Goal Status: Active Interventions: Assess patient pain level pre-, during and post procedure and prior to discharge Notes: ` Orientation to the Wound Care Program Nursing Diagnoses: Knowledge deficit related to the wound healing center program Goals: Patient/caregiver will verbalize understanding of the [place] Date Initiated: 09/06/2016 Target Resolution Date: 12/02/2016 Goal Status: Active Danielle, Zuniga ([id number]) Interventions: Provide education on orientation to the wound center Notes: ` Pressure Nursing Diagnoses: Knowledge deficit related to management of pressures ulcers Potential for impaired tissue integrity related to pressure, friction, moisture, and shear Goals: Patient will remain free of pressure ulcers Date Initiated: 09/06/2016 Target Resolution Date: 12/02/2016 Goal Status: Active Interventions: Assess: immobility, friction, shearing, incontinence upon admission and as needed Notes: ` Soft Tissue Infection Nursing Diagnoses: Impaired tissue integrity Potential for infection: soft  tissue Goals: Patient will remain free of wound infection Date Initiated: 09/06/2016 Target Resolution Date: 12/02/2016 Goal Status: Active Interventions: Assess signs and symptoms of infection every visit Notes: ` Wound/Skin Impairment Nursing Diagnoses: Impaired tissue integrity Goals: Ulcer/skin breakdown will heal within 14 weeks Date Initiated: 09/06/2016 Target Resolution Date: 12/16/2016 Goal Status: Active Interventions: Assess ulceration(s) every visit Notes: Danielle, Zuniga ([id number]) Electronic Signature(s) Signed: 08/11/2017 2:15:35 PM By: Gretta Cool, BSN, RN, CWS, Kim RN, BSN Entered By: Gretta Cool, BSN, RN, CWS, Kim on  08/11/2017 14:15:33 [name] ([id number]) -------------------------------------------------------------------------------- Pain Assessment Details Patient Name: [name]. Date of Service: 08/11/2017 1:45 PM Medical Record Number: [id number] Patient Account Number: [id number] Date of Birth/Sex: 1931-06-23 (83 y.o. Female) Treating RN: Cornell Barman Primary Care Mayo Owczarzak: [name] Other Clinician: Referring Sahib Pella: [name] Treating Amrom Ore/Extender: Frann Rider in Treatment: 48 Active Problems Location of Pain Severity and Description of Pain Patient Has Paino No Site Locations With Dressing Change: No Pain Management and Medication Current Pain Management: Electronic Signature(s) Signed: 08/11/2017 4:41:15 PM By: Gretta Cool, BSN, RN, CWS, Kim RN, BSN Entered By: Gretta Cool, BSN, RN, CWS, Kim on 08/11/2017 14:00:34 [name] ([id number]) -------------------------------------------------------------------------------- Patient/Caregiver Education Details Patient Name: [name] Date of Service: 08/11/2017 1:45 PM Medical Record Number: [id number] Patient Account Number: [id number] Date of Birth/Gender: 1930/10/22 (83 y.o. Female) Treating RN: Cornell Barman Primary Care Physician: [name] Other Clinician: Referring Physician: [name] Treating Physician/Extender: Frann Rider in Treatment: 2 Education Assessment Education Provided To: Patient Education Topics Provided Wound/Skin Impairment: Handouts: Caring for Your Ulcer, Other: wound care as prescribed Methods: Demonstration, Explain/Verbal Responses: State content correctly Electronic Signature(s) Signed: 08/11/2017 4:41:15 PM By: Gretta Cool, BSN, RN, CWS, Kim RN, BSN Entered By: Gretta Cool, BSN, RN, CWS, Kim on 08/11/2017 14:37:32 [name] ([id number]) -------------------------------------------------------------------------------- Wound Assessment Details Patient  Name: [name]. Date of Service: 08/11/2017 1:45 PM Medical Record Number: [id number] Patient Account Number: [id number] Date of Birth/Sex: 01/26/1931 (83 y.o. Female) Treating RN: Cornell Barman Primary Care Maxwell Lemen: [name] Other Clinician: Referring Shunsuke Granzow: [name] Treating Myleen Brailsford/Extender: Frann Rider in Treatment: 48 Wound Status Wound Number: 2 Primary Etiology: Pressure Ulcer Wound Location: Left Calcaneus - Medial Wound Status: Open Wounding Event: Pressure Injury Comorbid History: Cataracts, Hypertension Date Acquired: 07/02/2016 Weeks Of Treatment: 48 Clustered Wound: No Photos Photo Uploaded By: Gretta Cool, BSN, RN, CWS, Kim on 08/11/2017 14:16:08 Wound Measurements Length: (cm) 0.7 Width: (cm) 0.5 Depth: (cm) 0.1 Area: (cm) 0.275 Volume: (cm) 0.027 % Reduction in Area: 98.6% % Reduction in Volume: 98.6% Epithelialization: Medium (34-66%) Tunneling: No Undermining: No Wound Description Classification: Category/Stage III Foul Od Wound Margin: Flat and Intact Slough/ Exudate Amount: None Present or After Cleansing: No Fibrino No Wound Bed Granulation Amount: Large (67-100%) Exposed Structure Fascia Exposed: No Fat Layer (Subcutaneous Tissue) Exposed: No Tendon Exposed: No Muscle Exposed: No Joint Exposed: No Bone Exposed: No Limited to Skin Breakdown Periwound Skin Texture Texture Color No Abnormalities Noted: No No Abnormalities Noted: No Callus: Yes Atrophie Blanche: No Crepitus: No Cyanosis: No Danielle, Zuniga. ([id number]) Excoriation: No Ecchymosis: No Induration: No Erythema: No Rash: No Hemosiderin Staining: No Scarring: Yes Mottled: No Pallor: No Moisture Rubor: No No Abnormalities Noted: No Dry / Scaly: No Maceration: No Wound Preparation Ulcer Cleansing: Rinsed/Irrigated with Saline Topical Anesthetic Applied: Other: lidocaine 4%, Treatment Notes Wound #2 (Left, Medial Calcaneus) 1. Cleansed  with: Clean wound with Normal Saline 2. Anesthetic Topical Lidocaine 4% cream to wound bed prior  to debridement 4. Dressing Applied: Other dressing (specify in notes) 5. Secondary Dressing Applied ABD and Kerlix/Conform Notes Polymem [employer]; [employer]) Signed: 08/11/2017 4:41:15 PM By: Gretta Cool, BSN, RN, CWS, Kim RN, BSN Entered By: Gretta Cool, BSN, RN, CWS, Kim on 08/11/2017 14:11:59 [name] ([id number]) -------------------------------------------------------------------------------- [place] Details Patient Name: [name] Date of Service: 08/11/2017 1:45 PM Medical Record Number: [id number] Patient Account Number: [id number] Date of Birth/Sex: 25-Jun-1931 (83 y.o. Female) Treating RN: Cornell Barman Primary Care Antrice Pal: [name] Other Clinician: Referring Jenita Rayfield: [name] Treating Zymiere Trostle/Extender: Frann Rider in Treatment: 48 Vital Signs Time Taken: 14:00 Temperature (F): 98.2 Height (in): 63 Pulse (bpm): 59 Weight (lbs): 160 Respiratory Rate (breaths/min): 16 Body Mass Index (BMI): 28.3 Blood Pressure (mmHg): 174/70 Reference Range: 80 - 120 mg / dl Electronic Signature(s) Signed: 08/11/2017 4:41:15 PM By: Gretta Cool, BSN, RN, CWS, Kim RN, BSN Entered By: Gretta Cool, BSN, RN, CWS, Kim on 08/11/2017 14:01:00

## 2017-08-25 ENCOUNTER — Encounter: Payer: Medicare HMO | Attending: Surgery | Admitting: Surgery

## 2017-08-25 ENCOUNTER — Ambulatory Visit: Payer: Medicare HMO

## 2017-08-25 DIAGNOSIS — L89623 Pressure ulcer of left heel, stage 3: Secondary | ICD-10-CM | POA: Insufficient documentation

## 2017-08-25 DIAGNOSIS — Z993 Dependence on wheelchair: Secondary | ICD-10-CM | POA: Diagnosis not present

## 2017-08-25 DIAGNOSIS — D649 Anemia, unspecified: Secondary | ICD-10-CM | POA: Insufficient documentation

## 2017-08-25 DIAGNOSIS — L89613 Pressure ulcer of right heel, stage 3: Secondary | ICD-10-CM | POA: Insufficient documentation

## 2017-08-25 DIAGNOSIS — N189 Chronic kidney disease, unspecified: Secondary | ICD-10-CM | POA: Diagnosis not present

## 2017-08-25 DIAGNOSIS — I129 Hypertensive chronic kidney disease with stage 1 through stage 4 chronic kidney disease, or unspecified chronic kidney disease: Secondary | ICD-10-CM | POA: Insufficient documentation

## 2017-08-26 ENCOUNTER — Ambulatory Visit: Payer: Medicare HMO
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
## 2017-08-27 NOTE — Progress Notes (Addendum)
MEHEK, GREGA ([id number]) Visit Report for 08/25/2017 Arrival Information Details Patient Name: [name], [name]. Date of Service: 08/25/2017 1:45 PM Medical Record Number: [id number] Patient Account Number: [id number] Date of Birth/Sex: [birth year] (83 y.o. Female) Treating RN: Cornell Barman Primary Care Tramel Westbrook: [name] Other Clinician: Referring Denice Cardon: [name] Treating Dajanique Robley/Extender: Frann Rider in Treatment: 55 Visit Information History Since Last Visit Added or deleted any medications: No Patient Arrived: Wheel Chair Any new allergies or adverse reactions: No Arrival Time: 14:07 Had a fall or experienced change in No activities of daily living that may affect Accompanied By: self risk of falls: Transfer Assistance: None Signs or symptoms of abuse/neglect since last visito No Patient Identification Verified: Yes Hospitalized since last visit: No Secondary Verification Process Completed: Yes Has Dressing in Place as Prescribed: Yes Patient Requires Transmission-Based No Pain Present Now: No Precautions: Patient Has Alerts: No Electronic Signature(s) Signed: 08/25/2017 4:46:29 PM By: Roger Shelter Entered By: Roger Shelter on 08/25/2017 14:08:16 [name] ([id number]) -------------------------------------------------------------------------------- Clinic Level of Care Assessment Details Patient Name: [name] Date of Service: 08/25/2017 1:45 PM Medical Record Number: [id number] Patient Account Number: [id number] Date of Birth/Sex: 1930/10/30 (83 y.o. Female) Treating RN: Cornell Barman Primary Care Janmarie Smoot: [name] Other Clinician: Referring Jennesis Ramaswamy: [name] Treating Alaiya Martindelcampo/Extender: Frann Rider in Treatment: 54 Clinic Level of Care Assessment Items TOOL 4 Quantity Score []  - Use when only an EandM is performed on FOLLOW-UP visit 0 ASSESSMENTS - Nursing Assessment / Reassessment []  - Reassessment of  Co-morbidities (includes updates in patient status) 0 X- 1 5 Reassessment of Adherence to Treatment Plan ASSESSMENTS - Wound and Skin Assessment / Reassessment X - Simple Wound Assessment / Reassessment - one wound 1 5 []  - 0 Complex Wound Assessment / Reassessment - multiple wounds []  - 0 Dermatologic / Skin Assessment (not related to wound area) ASSESSMENTS - Focused Assessment []  - Circumferential Edema Measurements - multi extremities 0 []  - 0 Nutritional Assessment / Counseling / Intervention []  - 0 Lower Extremity Assessment (monofilament, tuning fork, pulses) []  - 0 Peripheral Arterial Disease Assessment (using hand held doppler) ASSESSMENTS - Ostomy and/or Continence Assessment and Care []  - Incontinence Assessment and Management 0 []  - 0 Ostomy Care Assessment and Management (repouching, etc.) PROCESS - Coordination of Care X - Simple Patient / Family Education for ongoing care 1 15 []  - 0 Complex (extensive) Patient / Family Education for ongoing care X- 1 10 Staff obtains Programmer, systems, Records, Test Results / Process Orders []  - 0 Staff telephones HHA, Nursing Homes / Clarify orders / etc []  - 0 Routine Transfer to another Facility (non-emergent condition) []  - 0 Routine Hospital Admission (non-emergent condition) []  - 0 New Admissions / Biomedical engineer / Ordering NPWT, Apligraf, etc. []  - 0 Emergency Hospital Admission (emergent condition) X- 1 10 Simple Discharge Coordination [name], [name] ([id number]) []  - 0 Complex (extensive) Discharge Coordination PROCESS - Special Needs []  - Pediatric / Minor Patient Management 0 []  - 0 Isolation Patient Management []  - 0 Hearing / Language / Visual special needs []  - 0 Assessment of Community assistance (transportation, D/C planning, etc.) []  - 0 Additional assistance / Altered mentation []  - 0 Support Surface(s) Assessment (bed, cushion, seat, etc.) INTERVENTIONS - Wound Cleansing / Measurement X -  Simple Wound Cleansing - one wound 1 5 []  - 0 Complex Wound Cleansing - multiple wounds X- 1 5 Wound Imaging (photographs - any number of wounds) []  - 0 Wound Tracing (instead  of photographs) X- 1 5 Simple Wound Measurement - one wound []  - 0 Complex Wound Measurement - multiple wounds INTERVENTIONS - Wound Dressings X - Small Wound Dressing one or multiple wounds 1 10 []  - 0 Medium Wound Dressing one or multiple wounds []  - 0 Large Wound Dressing one or multiple wounds []  - 0 Application of Medications - topical []  - 0 Application of Medications - injection INTERVENTIONS - Miscellaneous []  - External ear exam 0 []  - 0 Specimen Collection (cultures, biopsies, blood, body fluids, etc.) []  - 0 Specimen(s) / Culture(s) sent or taken to Lab for analysis []  - 0 Patient Transfer (multiple staff / Civil Service fast streamer / Similar devices) []  - 0 Simple Staple / Suture removal (25 or less) []  - 0 Complex Staple / Suture removal (26 or more) []  - 0 Hypo / Hyperglycemic Management (close monitor of Blood Glucose) []  - 0 Ankle / Brachial Index (ABI) - do not check if billed separately X- 1 5 Vital Signs [name], Danielle S. ([id number]) Has the patient been seen at the hospital within the last three years: Yes Total Score: 75 Level Of Care: New/Established - Level 2 Electronic Signature(s) Signed: 08/25/2017 5:22:35 PM By: Gretta Cool, BSN, RN, CWS, Kim RN, BSN Entered By: Gretta Cool, BSN, RN, CWS, Kim on 08/25/2017 16:54:45 [name] ([id number]) -------------------------------------------------------------------------------- Encounter Discharge Information Details Patient Name: [name]. Date of Service: 08/25/2017 1:45 PM Medical Record Number: [id number] Patient Account Number: [id number] Date of Birth/Sex: August 07, 1931 (83 y.o. Female) Treating RN: Cornell Barman Primary Care Elisabet Gutzmer: [name] Other Clinician: Referring Elana Jian: [name] Treating Ermelinda Eckert/Extender: Frann Rider in Treatment: 35 Encounter Discharge Information Items Discharge Pain Level: 0 Discharge Condition: Stable Ambulatory Status: Ambulatory Discharge Destination: Home Transportation: Private Auto Accompanied By: self Schedule Follow-up Appointment: Yes Medication Reconciliation completed and Yes provided to Patient/Care [name]: Patient Clinical Summary of Care: Declined Electronic Signature(s) Signed: 08/25/2017 5:10:47 PM By: Gretta Cool, BSN, RN, CWS, Kim RN, BSN Entered By: Gretta Cool, BSN, RN, CWS, Kim on 08/25/2017 17:10:47 [name] ([id number]) -------------------------------------------------------------------------------- Lower Extremity Assessment Details Patient Name: [name]. Date of Service: 08/25/2017 1:45 PM Medical Record Number: [id number] Patient Account Number: [id number] Date of Birth/Sex: September 16, 1931 (83 y.o. Female) Treating RN: Roger Shelter Primary Care Hilery Wintle: [name] Other Clinician: Referring Ileigh Mettler: [name] Treating Anise Harbin/Extender: Frann Rider in Treatment: 50 Edema Assessment Assessed: [Left: No] [Right: No] Edema: [Left: N] [Right: o] Vascular Assessment Pulses: Dorsalis Pedis Palpable: [Left:Yes] Posterior Tibial Extremity colors, hair growth, and conditions: Extremity Color: [Left:Normal] Hair Growth on Extremity: [Left:No] Temperature of Extremity: [Left:Warm] Capillary Refill: [Left:< 3 seconds] Toe Nail Assessment Left: Right: Thick: Yes Discolored: Yes Deformed: No Improper Length and Hygiene: No Electronic Signature(s) Signed: 08/25/2017 4:46:29 PM By: Roger Shelter Entered By: Roger Shelter on 08/25/2017 14:15:56 [name], [name] ([id number]) -------------------------------------------------------------------------------- Multi Wound Chart Details Patient Name: [name]. Date of Service: 08/25/2017 1:45 PM Medical Record Number: [id number] Patient Account Number:  [id number] Date of Birth/Sex: 16-Dec-1930 (83 y.o. Female) Treating RN: Cornell Barman Primary Care Kaylaann Mountz: [name] Other Clinician: Referring Manaia Samad: [name] Treating Theona Muhs/Extender: Frann Rider in Treatment: 50 Vital Signs Height(in): 63 Pulse(bpm): 61 Weight(lbs): 160 Blood Pressure(mmHg): 185/72 Body Mass Index(BMI): 28 Temperature(F): 98.5 Respiratory Rate 16 (breaths/min): Photos: [N/A:N/A] Wound Location: Left Calcaneus - Medial N/A N/A Wounding Event: Pressure Injury N/A N/A Primary Etiology: Pressure Ulcer N/A N/A Comorbid History: Cataracts, Hypertension N/A N/A Date Acquired: 07/02/2016 N/A N/A Weeks of Treatment: 50 N/A  N/A Wound Status: Open N/A N/A Measurements L x W x D 0.3x0.4x0.1 N/A N/A (cm) Area (cm) : 0.094 N/A N/A Volume (cm) : 0.009 N/A N/A % Reduction in Area: 99.50% N/A N/A % Reduction in Volume: 99.50% N/A N/A Classification: Category/Stage III N/A N/A Exudate Amount: None Present N/A N/A Wound Margin: Flat and Intact N/A N/A Granulation Amount: None Present (0%) N/A N/A Exposed Structures: Fascia: No N/A N/A Fat Layer (Subcutaneous Tissue) Exposed: No Tendon: No Muscle: No Joint: No Bone: No Limited to Skin Breakdown Epithelialization: Large (67-100%) N/A N/A Periwound Skin Texture: Callus: Yes N/A N/A Scarring: Yes Excoriation: No Induration: No [name], Danielle S. ([id number]) Crepitus: No Rash: No Periwound Skin Moisture: Maceration: No N/A N/A Dry/Scaly: No Periwound Skin Color: Atrophie Blanche: No N/A N/A Cyanosis: No Ecchymosis: No Erythema: No Hemosiderin Staining: No Mottled: No Pallor: No Rubor: No Tenderness on Palpation: No N/A N/A Wound Preparation: Ulcer Cleansing: N/A N/A Rinsed/Irrigated with Saline Topical Anesthetic Applied: Other: lidocaine 4% Treatment Notes Electronic Signature(s) Signed: 08/25/2017 3:09:38 PM By: Christin Fudge MD, FACS Entered By: Christin Fudge on  08/25/2017 15:09:37 [name] ([id number]) -------------------------------------------------------------------------------- Pain Assessment Details Patient Name: [name]. Date of Service: 08/25/2017 1:45 PM Medical Record Number: [id number] Patient Account Number: [id number] Date of Birth/Sex: 08-22-1931 (83 y.o. Female) Treating RN: Roger Shelter Primary Care Weber Monnier: [name] Other Clinician: Referring Ioana Louks: [name] Treating [name]/Extender: Frann Rider in Treatment: 50 Active Problems Location of Pain Severity and Description of Pain Patient Has Paino No Site Locations With Dressing Change: No Pain Management and Medication Current Pain Management: Goals for Pain Management Topical or injectable lidocaine is offered to patient for acute pain when surgical debridement is performed. If needed, Patient is instructed to use over the counter pain medication for the following 24-48 hours after debridement. Wound care MDs do not prescribed pain medications. Patient has chronic pain or uncontrolled pain. Patient has been instructed to make an appointment with their Primary Care Physician for pain management. Electronic Signature(s) Signed: 08/25/2017 4:46:29 PM By: Roger Shelter Entered By: Roger Shelter on 08/25/2017 14:08:32 [name] ([id number]) -------------------------------------------------------------------------------- Patient/Caregiver Education Details Patient Name: [name] Date of Service: 08/25/2017 1:45 PM Medical Record Number: [id number] Patient Account Number: [id number] Date of Birth/Gender: 1931-03-29 (83 y.o. Female) Treating RN: Cornell Barman Primary Care Physician: [name] Other Clinician: Referring Physician: [name] Treating Physician/Extender: Frann Rider in Treatment: 57 Education Assessment Education Provided To: Patient Education Topics Provided Wound/Skin  Impairment: Handouts: Caring for Your Ulcer Methods: Demonstration, Explain/Verbal Responses: State content correctly Electronic Signature(s) Signed: 08/25/2017 5:22:35 PM By: Gretta Cool, BSN, RN, CWS, Kim RN, BSN Entered By: Gretta Cool, BSN, RN, CWS, Kim on 08/25/2017 17:10:59 [name] ([id number]) -------------------------------------------------------------------------------- Wound Assessment Details Patient Name: [name]. Date of Service: 08/25/2017 1:45 PM Medical Record Number: [id number] Patient Account Number: [id number] Date of Birth/Sex: [birth year] (83 y.o. Female) Treating RN: Roger Shelter Primary Care Kimmie Berggren: [name] Other Clinician: Referring Asyia Hornung: [name] Treating Lavetta Geier/Extender: Frann Rider in Treatment: 50 Wound Status Wound Number: 2 Primary Etiology: Pressure Ulcer Wound Location: Left Calcaneus - Medial Wound Status: Open Wounding Event: Pressure Injury Comorbid History: Cataracts, Hypertension Date Acquired: 07/02/2016 Weeks Of Treatment: 50 Clustered Wound: No Photos Wound Measurements Length: (cm) 0.3 Width: (cm) 0.4 Depth: (cm) 0.1 Area: (cm) 0.094 Volume: (cm) 0.009 % Reduction in Area: 99.5% % Reduction in Volume: 99.5% Epithelialization: Large (67-100%) Tunneling: No Undermining: No Wound Description Classification: Category/Stage III Foul  Od Wound Margin: Flat and Intact Slough/ Exudate Amount: None Present or After Cleansing: No Fibrino No Wound Bed Granulation Amount: None Present (0%) Exposed Structure Fascia Exposed: No Fat Layer (Subcutaneous Tissue) Exposed: No Tendon Exposed: No Muscle Exposed: No Joint Exposed: No Bone Exposed: No Limited to Skin Breakdown Periwound Skin Texture Texture Color No Abnormalities Noted: No No Abnormalities Noted: No Callus: Yes Atrophie Blanche: No Crepitus: No Cyanosis: No Excoriation: No Ecchymosis: No [name], [name]. ([id number]) Induration:  No Erythema: No Rash: No Hemosiderin Staining: No Scarring: Yes Mottled: No Pallor: No Moisture Rubor: No No Abnormalities Noted: No Dry / Scaly: No Maceration: No Wound Preparation Ulcer Cleansing: Rinsed/Irrigated with Saline Topical Anesthetic Applied: Other: lidocaine 4%, Treatment Notes Wound #2 (Left, Medial Calcaneus) 1. Cleansed with: Clean wound with Normal Saline 2. Anesthetic Topical Lidocaine 4% cream to wound bed prior to debridement 4. Dressing Applied: Other dressing (specify in notes) 5. Secondary Dressing Applied ABD and Kerlix/Conform Notes Polymem AG; Sage boots Electronic Signature(s) Signed: 08/25/2017 4:46:29 PM By: Roger Shelter Entered By: Roger Shelter on 08/25/2017 14:15:05 [name] ([id number]) -------------------------------------------------------------------------------- [place] Details Patient Name: [name] Date of Service: 08/25/2017 1:45 PM Medical Record Number: [id number] Patient Account Number: [id number] Date of Birth/Sex: March 18, 1931 (83 y.o. Female) Treating RN: Roger Shelter Primary Care Atley Scarboro: [name] Other Clinician: Referring Anea Fodera: [name] Treating [name]/Extender: Frann Rider in Treatment: 50 Vital Signs Time Taken: 14:08 Temperature (F): 98.5 Height (in): 63 Pulse (bpm): 61 Weight (lbs): 160 Respiratory Rate (breaths/min): 16 Body Mass Index (BMI): 28.3 Blood Pressure (mmHg): 185/72 Reference Range: 80 - 120 mg / dl Notes Patient's BP medications have run out. She will stop on her way home to get a refill. Patient to follow up with PCP for HTN. Electronic Signature(s) Signed: 08/25/2017 4:46:29 PM By: Roger Shelter Entered By: Roger Shelter on 08/25/2017 14:09:58

## 2017-08-29 ENCOUNTER — Ambulatory Visit (INDEPENDENT_AMBULATORY_CARE_PROVIDER_SITE_OTHER): Payer: Medicare HMO

## 2017-08-29 VITALS — BP 207/84 | HR 61

## 2017-08-29 DIAGNOSIS — N390 Urinary tract infection, site not specified: Secondary | ICD-10-CM | POA: Diagnosis not present

## 2017-08-29 DIAGNOSIS — R339 Retention of urine, unspecified: Secondary | ICD-10-CM

## 2017-08-29 NOTE — Progress Notes (Signed)
Suprapubic Cath Change  Patient is present today for a suprapubic catheter change due to urinary retention.  82ml of water was drained from the balloon, a 16FR foley cath was removed from the tract with out difficulty.  Site was cleaned and prepped in a sterile fashion with betadine.  A 16FR foley cath was replaced into the tract no complications were noted. Urine return was noted, 10 ml of sterile water was inflated into the balloon and a night bag was attached for drainage.  Patient tolerated well.   Preformed by: S. Watts, CMA and C. Corinna Capra, CMA  Follow up: 1 month

## 2017-09-05 ENCOUNTER — Telehealth: Payer: Self-pay

## 2017-09-05 NOTE — Telephone Encounter (Signed)
Maudie Mercury, pt home health nurse, called stating pt is due to be d/c from home health as her wound has healed. Maudie Mercury also stated that pt is low income and to come to [facility] once a month for SPT change it cost $45 for her ride. Kim inquired about home health changing SPT monthly. Please advise.

## 2017-09-05 NOTE — Telephone Encounter (Signed)
That would be fine.  She will need a cystoscopy in 07/2018.

## 2017-09-12 NOTE — Telephone Encounter (Signed)
Not able to find [name]'s information. Spoke with pt and asked for [name] information. Pt will call back with number.

## 2017-09-15 ENCOUNTER — Ambulatory Visit: Payer: Medicare HMO | Admitting: Surgery

## 2017-09-28 ENCOUNTER — Ambulatory Visit: Payer: Medicare HMO

## 2017-09-29 ENCOUNTER — Encounter: Payer: Medicare HMO | Attending: Surgery | Admitting: Surgery

## 2017-09-29 DIAGNOSIS — Z993 Dependence on wheelchair: Secondary | ICD-10-CM | POA: Diagnosis not present

## 2017-09-29 DIAGNOSIS — Z87891 Personal history of nicotine dependence: Secondary | ICD-10-CM | POA: Insufficient documentation

## 2017-09-29 DIAGNOSIS — N189 Chronic kidney disease, unspecified: Secondary | ICD-10-CM | POA: Diagnosis not present

## 2017-09-29 DIAGNOSIS — L89623 Pressure ulcer of left heel, stage 3: Secondary | ICD-10-CM | POA: Insufficient documentation

## 2017-09-29 DIAGNOSIS — I129 Hypertensive chronic kidney disease with stage 1 through stage 4 chronic kidney disease, or unspecified chronic kidney disease: Secondary | ICD-10-CM | POA: Insufficient documentation

## 2017-09-29 DIAGNOSIS — L89613 Pressure ulcer of right heel, stage 3: Secondary | ICD-10-CM | POA: Diagnosis not present

## 2017-09-30 ENCOUNTER — Ambulatory Visit: Payer: Medicare HMO

## 2017-10-01 NOTE — Progress Notes (Signed)
ELLASYN, SWILLING ([id number]) Visit Report for 09/29/2017 Arrival Information Details Patient Name: [name], [name]. Date of Service: 09/29/2017 1:45 PM Medical Record Number: [id number] Patient Account Number: [id number] Date of Birth/Sex: [birth year] (83 y.o. Female) Treating RN: Roger Shelter Primary Care Derisha Funderburke: [name] Other Clinician: Referring Nnaemeka Samson: [name] Treating Jareb Radoncic/Extender: Frann Rider in Treatment: 69 Visit Information History Since Last Visit All ordered tests and consults were completed: No Patient Arrived: Wheel Chair Added or deleted any medications: No Arrival Time: 14:15 Any new allergies or adverse reactions: No Accompanied By: driver Had a fall or experienced change in No activities of daily living that may affect Transfer Assistance: None risk of falls: Patient Requires Transmission-Based No Signs or symptoms of abuse/neglect since last visito No Precautions: Hospitalized since last visit: No Patient Has Alerts: No Pain Present Now: No Electronic Signature(s) Signed: 09/29/2017 5:14:07 PM By: Roger Shelter Entered By: Roger Shelter on 09/29/2017 14:15:29 [name] ([id number]) -------------------------------------------------------------------------------- Encounter Discharge Information Details Patient Name: [name]. Date of Service: 09/29/2017 1:45 PM Medical Record Number: [id number] Patient Account Number: [id number] Date of Birth/Sex: 03-May-1931 (83 y.o. Female) Treating RN: Roger Shelter Primary Care Giulio Bertino: [name] Other Clinician: Referring Niasha Devins: [name] Treating [place] Warriner/Extender: Frann Rider in Treatment: 41 Encounter Discharge Information Items Discharge Pain Level: 0 Discharge Condition: Stable Ambulatory Status: Wheelchair Discharge Destination: Home Transportation: Other Accompanied By: driver Schedule Follow-up Appointment: Yes Medication  Reconciliation completed and No provided to Patient/Care Amariyana Heacox: Provided on Clinical Summary of Care: 09/29/2017 Form Type Recipient Paper Patient EB Electronic Signature(s) Signed: 09/29/2017 5:04:32 PM By: Roger Shelter Entered By: Roger Shelter on 09/29/2017 17:04:32 [name] ([id number]) -------------------------------------------------------------------------------- Lower Extremity Assessment Details Patient Name: [name]. Date of Service: 09/29/2017 1:45 PM Medical Record Number: [id number] Patient Account Number: [id number] Date of Birth/Sex: [birth year] (83 y.o. Female) Treating RN: Roger Shelter Primary Care Franceen Erisman: [name] Other Clinician: Referring Krystianna Soth: [name] Treating Debbie Bellucci/Extender: Frann Rider in Treatment: 55 Edema Assessment Assessed: [Left: Yes] [Right: Yes] Edema: [Left: Yes] [Right: Yes] Vascular Assessment Claudication: Claudication Assessment [Left:None] [Right:None] Pulses: Dorsalis Pedis Palpable: [Left:Yes] [Right:Yes] Posterior Tibial Extremity colors, hair growth, and conditions: Extremity Color: [Left:Normal] [Right:Normal] Hair Growth on Extremity: [Left:No] [Right:No] Temperature of Extremity: [Right:Cool] Capillary Refill: [Left:> 3 seconds] [Right:> 3 seconds] Toe Nail Assessment Left: Right: Thick: Yes Yes Discolored: Yes Yes Deformed: Yes Yes Improper Length and Hygiene: Yes Yes Electronic Signature(s) Signed: 09/29/2017 5:14:07 PM By: Roger Shelter Entered By: Roger Shelter on 09/29/2017 14:43:45 [name], [name] ([id number]) -------------------------------------------------------------------------------- Multi Wound Chart Details Patient Name: [name]. Date of Service: 09/29/2017 1:45 PM Medical Record Number: [id number] Patient Account Number: [id number] Date of Birth/Sex: 09-May-1931 (83 y.o. Female) Treating RN: Roger Shelter Primary Care Earlee Herald:  [name] Other Clinician: Referring Marrion Accomando: [name] Treating Sheenah Dimitroff/Extender: Frann Rider in Treatment: 55 Vital Signs Height(in): 63 Pulse(bpm): 60 Weight(lbs): 160 Blood Pressure(mmHg): 147/47 Body Mass Index(BMI): 28 Temperature(F): 98.4 Respiratory Rate 16 (breaths/min): Photos: [2:No Photos] [8:No Photos] [N/A:N/A] Wound Location: [2:Left, Medial Calcaneus] [8:Right Calcaneus] [N/A:N/A] Wounding Event: [2:Pressure Injury] [8:Pressure Injury] [N/A:N/A] Primary Etiology: [2:Pressure Ulcer] [8:Pressure Ulcer] [N/A:N/A] Comorbid History: [2:N/A] [8:Cataracts, Hypertension] [N/A:N/A] Date Acquired: [2:07/02/2016] [8:09/15/2017] [N/A:N/A] Weeks of Treatment: [2:55] [8:0] [N/A:N/A] Wound Status: [2:Open] [8:Open] [N/A:N/A] Measurements L x W x D [2:1.6x2.5x0.1] [8:1.2x2.7x0.2] [N/A:N/A] (cm) Area (cm) : [2:3.142] [8:2.545] [N/A:N/A] Volume (cm) : [6:0.630] [8:0.509] [N/A:N/A] % Reduction in Area: [2:84.10%] [8:N/A] [N/A:N/A] % Reduction in Volume: [2:84.10%] [8:N/A] [N/A:N/A] Classification: [  2:Category/Stage III] [8:Category/Stage III] [N/A:N/A] Exudate Amount: [2:N/A] [8:Large] [N/A:N/A] Exudate Type: [2:N/A] [8:Serous] [N/A:N/A] Exudate Color: [2:N/A] [8:amber] [N/A:N/A] Wound Margin: [2:N/A] [8:Flat and Intact] [N/A:N/A] Granulation Amount: [2:N/A] [8:Large (67-100%)] [N/A:N/A] Granulation Quality: [2:N/A] [8:Red] [N/A:N/A] Necrotic Amount: [2:N/A] [8:Small (1-33%)] [N/A:N/A] Necrotic Tissue: [2:N/A] [8:Eschar] [N/A:N/A] Epithelialization: [2:N/A] [8:None] [N/A:N/A] Periwound Skin Texture: [2:No Abnormalities Noted] [8:Excoriation: No Induration: No Callus: No Crepitus: No Rash: No Scarring: No] [N/A:N/A] Periwound Skin Moisture: [2:No Abnormalities Noted] [8:Maceration: No Dry/Scaly: No] [N/A:N/A] Periwound Skin Color: [2:No Abnormalities Noted] [8:Atrophie Blanche: No Cyanosis: No Ecchymosis: No Erythema: No Hemosiderin Staining: No]  [N/A:N/A] Mottled: No Pallor: No Rubor: No Temperature: N/A No Abnormality N/A Tenderness on Palpation: No Yes N/A Wound Preparation: N/A Ulcer Cleansing: N/A Rinsed/Irrigated with Saline Topical Anesthetic Applied: Other: lidocaine 4% Treatment Notes Electronic Signature(s) Signed: 09/29/2017 2:44:49 PM By: Christin Fudge MD, FACS Entered By: Christin Fudge on 09/29/2017 14:44:49 [name] ([id number]) -------------------------------------------------------------------------------- [facility] Details Patient Name: [name] Date of Service: 09/29/2017 1:45 PM Medical Record Number: [id number] Patient Account Number: [id number] Date of Birth/Sex: 1931/01/15 (83 y.o. Female) Treating RN: Roger Shelter Primary Care Peytyn Trine: [name] Other Clinician: Referring Tambria Pfannenstiel: [name] Treating Cedar Ditullio/Extender: Frann Rider in Treatment: 103 Active Inactive ` Abuse / Safety / Falls / Self Care Management Nursing Diagnoses: Impaired physical mobility Potential for falls Goals: Patient will remain injury free Date Initiated: 09/06/2016 Target Resolution Date: 12/02/2016 Goal Status: Active Interventions: Assess fall risk on admission and as needed Assess self care needs on admission and as needed Notes: ` Necrotic Tissue Nursing Diagnoses: Impaired tissue integrity related to necrotic/devitalized tissue Goals: Necrotic/devitalized tissue will be minimized in the wound bed Date Initiated: 09/06/2016 Target Resolution Date: 12/02/2016 Goal Status: Active Interventions: Assess patient pain level pre-, during and post procedure and prior to discharge Notes: ` Orientation to the Wound Care Program Nursing Diagnoses: Knowledge deficit related to the wound healing center program Goals: Patient/caregiver will verbalize understanding of the [place] Program Date Initiated: 09/06/2016 Target Resolution Date:  12/02/2016 Goal Status: Active ABRA, LINGENFELTER ([id number]) Interventions: Provide education on orientation to the wound center Notes: ` Pressure Nursing Diagnoses: Knowledge deficit related to management of pressures ulcers Potential for impaired tissue integrity related to pressure, friction, moisture, and shear Goals: Patient will remain free of pressure ulcers Date Initiated: 09/06/2016 Target Resolution Date: 12/02/2016 Goal Status: Active Interventions: Assess: immobility, friction, shearing, incontinence upon admission and as needed Notes: ` Soft Tissue Infection Nursing Diagnoses: Impaired tissue integrity Potential for infection: soft tissue Goals: Patient will remain free of wound infection Date Initiated: 09/06/2016 Target Resolution Date: 12/02/2016 Goal Status: Active Interventions: Assess signs and symptoms of infection every visit Notes: ` Wound/Skin Impairment Nursing Diagnoses: Impaired tissue integrity Goals: Ulcer/skin breakdown will heal within 14 weeks Date Initiated: 09/06/2016 Target Resolution Date: 12/16/2016 Goal Status: Active Interventions: Assess ulceration(s) every visit Notes: TALENE, GLASTETTER ([id number]) Electronic Signature(s) Signed: 09/29/2017 5:14:07 PM By: Roger Shelter Entered By: Roger Shelter on 09/29/2017 14:43:57 Mccartin, [name] ([id number]) -------------------------------------------------------------------------------- Pain Assessment Details Patient Name: [name]. Date of Service: 09/29/2017 1:45 PM Medical Record Number: [id number] Patient Account Number: [id number] Date of Birth/Sex: 11/05/1930 (83 y.o. Female) Treating RN: Roger Shelter Primary Care Jaykwon Morones: [name] Other Clinician: Referring Deshanae Lindo: [name] Treating Kamron Portee/Extender: Frann Rider in Treatment: 65 Active Problems Location of Pain Severity and Description of Pain Patient Has Paino No Site Locations Pain  Management and Medication Current Pain Management: Electronic Signature(s)  Signed: 09/29/2017 5:14:07 PM By: Roger Shelter Entered By: Roger Shelter on 09/29/2017 14:15:37 [name] ([id number]) -------------------------------------------------------------------------------- Patient/Caregiver Education Details Patient Name: [name] Date of Service: 09/29/2017 1:45 PM Medical Record Number: [id number] Patient Account Number: [id number] Date of Birth/Gender: 1931/09/19 (83 y.o. Female) Treating RN: Roger Shelter Primary Care Physician: [name] Other Clinician: Referring Physician: [name] Treating Physician/Extender: Frann Rider in Treatment: 22 Education Assessment Education Provided To: Patient and Caregiver Home health orders faxed Education Topics Provided Wound Debridement: Handouts: Wound Debridement Methods: Explain/Verbal Responses: State content correctly Wound/Skin Impairment: Handouts: Caring for Your Ulcer Methods: Explain/Verbal Responses: State content correctly Electronic Signature(s) Signed: 09/29/2017 5:14:07 PM By: Roger Shelter Entered By: Roger Shelter on 09/29/2017 17:05:42 Wrobel, [name] ([id number]) -------------------------------------------------------------------------------- Wound Assessment Details Patient Name: [name]. Date of Service: 09/29/2017 1:45 PM Medical Record Number: [id number] Patient Account Number: [id number] Date of Birth/Sex: 1931-10-16 (83 y.o. Female) Treating RN: Roger Shelter Primary Care Kabeer Hoagland: [name] Other Clinician: Referring Sheyenne Konz: [name] Treating Asier Desroches/Extender: Frann Rider in Treatment: 55 Wound Status Wound Number: 2 Primary Etiology: Pressure Ulcer Wound Location: Left Calcaneus - Medial Wound Status: Open Wounding Event: Pressure Injury Comorbid History: Cataracts, Hypertension Date Acquired: 07/02/2016 Weeks Of  Treatment: 55 Clustered Wound: No Photos Wound Measurements Length: (cm) 1.6 Width: (cm) 2.5 Depth: (cm) 0.1 Area: (cm) 3.142 Volume: (cm) 0.314 % Reduction in Area: 84.1% % Reduction in Volume: 84.1% Epithelialization: Large (67-100%) Wound Description Classification: Category/Stage III Foul Odo Wound Margin: Flat and Intact Slough/F Exudate Amount: None Present r After Cleansing: No ibrino No Wound Bed Granulation Amount: None Present (0%) Exposed Structure Fascia Exposed: No Fat Layer (Subcutaneous Tissue) Exposed: No Tendon Exposed: No Muscle Exposed: No Joint Exposed: No Bone Exposed: No Limited to Skin Breakdown Periwound Skin Texture Texture Color No Abnormalities Noted: No No Abnormalities Noted: No SHILO, PHILIPSON. ([id number]) Callus: Yes Atrophie Blanche: No Crepitus: No Cyanosis: No Excoriation: No Ecchymosis: No Induration: No Erythema: No Rash: No Hemosiderin Staining: No Scarring: Yes Mottled: No Pallor: No Moisture Rubor: No No Abnormalities Noted: No Dry / Scaly: No Maceration: No Wound Preparation Ulcer Cleansing: Rinsed/Irrigated with Saline Topical Anesthetic Applied: Other: lidocaine 4%, Treatment Notes Wound #2 (Left, Medial Calcaneus) 1. Cleansed with: Clean wound with Normal Saline 2. Anesthetic Topical Lidocaine 4% cream to wound bed prior to debridement 4. Dressing Applied: Other dressing (specify in notes) Notes silvercell, heel cups and gauze wraps bilateral. Electronic Signature(s) Signed: 09/29/2017 5:01:56 PM By: Roger Shelter Entered By: Roger Shelter on 09/29/2017 17:01:55 [name] ([id number]) -------------------------------------------------------------------------------- Wound Assessment Details Patient Name: [name]. Date of Service: 09/29/2017 1:45 PM Medical Record Number: [id number] Patient Account Number: [id number] Date of Birth/Sex: 22-Aug-1931 (83 y.o. Female) Treating RN: Roger Shelter Primary Care Ashleen Demma: [name] Other Clinician: Referring Kyndal Heringer: [name] Treating Melissa Pulido/Extender: Frann Rider in Treatment: 55 Wound Status Wound Number: 8 Primary Etiology: Pressure Ulcer Wound Location: Right Calcaneus Wound Status: Open Wounding Event: Pressure Injury Comorbid History: Cataracts, Hypertension Date Acquired: 09/15/2017 Weeks Of Treatment: 0 Clustered Wound: No Photos Wound Measurements Length: (cm) 1.2 Width: (cm) 2.7 Depth: (cm) 0.2 Area: (cm) 2.545 Volume: (cm) 0.509 % Reduction in Area: 0% % Reduction in Volume: 0% Epithelialization: None Tunneling: No Undermining: No Wound Description Classification: Category/Stage III Wound Margin: Flat and Intact Exudate Amount: Large Exudate Type: Serous Exudate Color: amber Foul Odor After Cleansing: No Slough/Fibrino No Wound Bed Granulation Amount: Large (67-100%) Exposed Structure Granulation Quality:  Red Fat Layer (Subcutaneous Tissue) Exposed: Yes Necrotic Amount: Small (1-33%) Necrotic Quality: Eschar Periwound Skin Texture Texture Color No Abnormalities Noted: No No Abnormalities Noted: No Callus: No Atrophie Blanche: No Crepitus: No Cyanosis: No KENZI, BARDWELL. ([id number]) Excoriation: No Ecchymosis: No Induration: No Erythema: No Rash: No Hemosiderin Staining: No Scarring: No Mottled: No Pallor: No Moisture Rubor: No No Abnormalities Noted: No Dry / Scaly: No Temperature / Pain Maceration: No Temperature: No Abnormality Tenderness on Palpation: Yes Wound Preparation Ulcer Cleansing: Rinsed/Irrigated with Saline Topical Anesthetic Applied: Other: lidocaine 4%, Treatment Notes Wound #8 (Right Calcaneus) 1. Cleansed with: Clean wound with Normal Saline 2. Anesthetic Topical Lidocaine 4% cream to wound bed prior to debridement 4. Dressing Applied: Other dressing (specify in notes) Notes silvercell, heel cups and gauze wraps  bilateral. Electronic Signature(s) Signed: 09/29/2017 5:02:18 PM By: Roger Shelter Entered By: Roger Shelter on 09/29/2017 17:02:18 [name] ([id number]) -------------------------------------------------------------------------------- Vitals Details Patient Name: [name]. Date of Service: 09/29/2017 1:45 PM Medical Record Number: [id number] Patient Account Number: [id number] Date of Birth/Sex: 03/17/1931 (83 y.o. Female) Treating RN: Roger Shelter Primary Care Jaimon Bugaj: [name] Other Clinician: Referring Kenshin Splawn: [name] Treating Yasmin Dibello/Extender: Frann Rider in Treatment: 55 Vital Signs Time Taken: 02:15 Temperature (F): 98.4 Height (in): 63 Pulse (bpm): 60 Weight (lbs): 160 Respiratory Rate (breaths/min): 16 Body Mass Index (BMI): 28.3 Blood Pressure (mmHg): 147/47 Reference Range: 80 - 120 mg / dl Electronic Signature(s) Signed: 09/29/2017 5:14:07 PM By: Roger Shelter Entered By: Roger Shelter on 09/29/2017 14:16:03
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
## 2017-10-06 ENCOUNTER — Ambulatory Visit: Payer: Medicare HMO

## 2017-10-06 DIAGNOSIS — R339 Retention of urine, unspecified: Secondary | ICD-10-CM

## 2017-10-06 NOTE — Progress Notes (Signed)
Pt is getting SPT changed by home health. Per [employer] phone note pt does not need to be seen until 07/2018 for cysto. Pt made cysto appt at check out.

## 2017-10-07 ENCOUNTER — Ambulatory Visit: Payer: Medicare HMO | Admitting: Physician Assistant

## 2017-10-13 ENCOUNTER — Encounter: Payer: Medicare HMO | Admitting: Surgery

## 2017-10-13 DIAGNOSIS — L89623 Pressure ulcer of left heel, stage 3: Secondary | ICD-10-CM | POA: Diagnosis not present

## 2017-10-15 NOTE — Progress Notes (Signed)
RAYLYN, CARTON ([id number]) Visit Report for 10/13/2017 Arrival Information Details Patient Name: [name], [name]. Date of Service: 10/13/2017 1:45 PM Medical Record Number: [id number] Patient Account Number: [id number] Date of Birth/Sex: 12-19-1930 (83 y.o. Female) Treating RN: Roger Shelter Primary Care Sai Moura: [name] Other Clinician: Referring Talyah Seder: [name] Treating Geanie Pacifico/Extender: Frann Rider in Treatment: 29 Visit Information History Since Last Visit All ordered tests and consults were completed: No Patient Arrived: Wheel Chair Added or deleted any medications: No Arrival Time: 14:09 Any new allergies or adverse reactions: No Accompanied By: driver Had a fall or experienced change in No activities of daily living that may affect Transfer Assistance: Harrel Lemon Lift risk of falls: Patient Requires Transmission-Based No Signs or symptoms of abuse/neglect since last visito No Precautions: Hospitalized since last visit: No Patient Has Alerts: No Pain Present Now: No Electronic Signature(s) Signed: 10/14/2017 2:36:38 PM By: Roger Shelter Entered By: Roger Shelter on 10/13/2017 14:09:45 [name] ([id number]) -------------------------------------------------------------------------------- Lower Extremity Assessment Details Patient Name: [name]. Date of Service: 10/13/2017 1:45 PM Medical Record Number: [id number] Patient Account Number: [id number] Date of Birth/Sex: 14-Oct-1931 (83 y.o. Female) Treating RN: Roger Shelter Primary Care Lavert Matousek: [name] Other Clinician: Referring Kenneith Stief: [name] Treating Trust Leh/Extender: Frann Rider in Treatment: 57 Edema Assessment Assessed: [Left: No] [Right: No] Edema: [Left: No] [Right: No] Vascular Assessment Claudication: Claudication Assessment [Left:None] [Right:None] Pulses: Dorsalis Pedis Palpable: [Left:Yes] [Right:Yes] Posterior  Tibial Extremity colors, hair growth, and conditions: Extremity Color: [Left:Normal] [Right:Normal] Hair Growth on Extremity: [Left:No] [Right:No] Temperature of Extremity: [Left:Cool] [Right:Cool] Capillary Refill: [Left:> 3 seconds] [Right:> 3 seconds] Toe Nail Assessment Left: Right: Thick: Yes Yes Discolored: Yes Yes Deformed: No No Improper Length and Hygiene: No No Electronic Signature(s) Signed: 10/14/2017 2:36:38 PM By: Roger Shelter Entered By: Roger Shelter on 10/13/2017 14:22:05 [name] ([id number]) -------------------------------------------------------------------------------- Multi Wound Chart Details Patient Name: [name]. Date of Service: 10/13/2017 1:45 PM Medical Record Number: [id number] Patient Account Number: [id number] Date of Birth/Sex: Sep 14, 1931 (83 y.o. Female) Treating RN: Roger Shelter Primary Care Marquise Wicke: [name] Other Clinician: Referring Lesieli Bresee: [name] Treating Chrisotpher Rivero/Extender: Frann Rider in Treatment: 24 Vital Signs Height(in): 63 Pulse(bpm): 78 Weight(lbs): 160 Blood Pressure(mmHg): 169/60 Body Mass Index(BMI): 28 Temperature(F): 98. Respiratory Rate 16 (breaths/min): Photos: [2:No Photos] [8:No Photos] [N/A:N/A] Wound Location: [2:Left Calcaneus - Medial] [8:Right Calcaneus] [N/A:N/A] Wounding Event: [2:Pressure Injury] [8:Pressure Injury] [N/A:N/A] Primary Etiology: [2:Pressure Ulcer] [8:Pressure Ulcer] [N/A:N/A] Comorbid History: [2:Cataracts, Hypertension] [8:Cataracts, Hypertension] [N/A:N/A] Date Acquired: [2:07/02/2016] [8:09/15/2017] [N/A:N/A] Weeks of Treatment: [2:57] [8:2] [N/A:N/A] Wound Status: [2:Open] [8:Open] [N/A:N/A] Measurements L x W x D [2:2.2x2.9x0.4] [8:1.9x2.6x0.4] [N/A:N/A] (cm) Area (cm) : [2:5.011] [8:3.88] [N/A:N/A] Volume (cm) : [2:2.004] [8:1.552] [N/A:N/A] % Reduction in Area: [2:74.70%] [8:-52.50%] [N/A:N/A] % Reduction in Volume: [2:-1.30%]  [8:-204.90%] [N/A:N/A] Classification: [2:Category/Stage III] [8:Category/Stage III] [N/A:N/A] Exudate Amount: [2:Medium] [8:Large] [N/A:N/A] Exudate Type: [2:Serosanguineous] [8:Serosanguineous] [N/A:N/A] Exudate Color: [2:red, brown] [8:red, brown] [N/A:N/A] Wound Margin: [2:Flat and Intact] [8:Flat and Intact] [N/A:N/A] Granulation Amount: [2:Medium (34-66%)] [8:Medium (34-66%)] [N/A:N/A] Granulation Quality: [2:Red] [8:Red] [N/A:N/A] Necrotic Amount: [2:Medium (34-66%)] [8:Medium (34-66%)] [N/A:N/A] Necrotic Tissue: [2:Adherent Slough] [8:Eschar, Adherent Slough] [N/A:N/A] Exposed Structures: [2:Fat Layer (Subcutaneous Tissue) Exposed: Yes Fascia: No Tendon: No Muscle: No Joint: No Bone: No] [8:Fat Layer (Subcutaneous Tissue) Exposed: Yes] [N/A:N/A] Epithelialization: [2:Small (1-33%)] [8:None] [N/A:N/A] Debridement: [2:Debridement ([id number])] [8:Debridement ([id number])] [N/A:N/A] Pre-procedure [2:02:29] [8:02:29] [N/A:N/A] Verification/Time Out Taken: Pain Control: [2:Other] [8:Other] [N/A:N/A] Tissue Debrided: [2:Fibrin/Slough, Skin, Subcutaneous] [8:Fibrin/Slough, Skin, Subcutaneous] [N/A:N/A] Level: Skin/Subcutaneous Tissue  Skin/Subcutaneous Tissue N/A Debridement Area (sq cm): 6.38 4.94 N/A Instrument: Curette Curette N/A Bleeding: Moderate Moderate N/A Hemostasis Achieved: Pressure Pressure N/A Procedural Pain: 0 0 N/A Post Procedural Pain: 0 0 N/A Debridement Treatment Procedure was tolerated well Procedure was tolerated well N/A Response: Post Debridement 2.2x2.9x0.5 1.9x2.6x0.5 N/A Measurements L x W x D (cm) Post Debridement Volume: 2.505 1.94 N/A (cm) Post Debridement Stage: Category/Stage III Category/Stage III N/A Periwound Skin Texture: Induration: Yes Induration: Yes N/A Callus: Yes Callus: Yes Scarring: Yes Excoriation: No Excoriation: No Crepitus: No Crepitus: No Rash: No Rash: No Scarring: No Periwound Skin Moisture: Maceration:  No Maceration: No N/A Dry/Scaly: No Dry/Scaly: No Periwound Skin Color: Atrophie Blanche: No Atrophie Blanche: No N/A Cyanosis: No Cyanosis: No Ecchymosis: No Ecchymosis: No Erythema: No Erythema: No Hemosiderin Staining: No Hemosiderin Staining: No Mottled: No Mottled: No Pallor: No Pallor: No Rubor: No Rubor: No Temperature: N/A No Abnormality N/A Tenderness on Palpation: No Yes N/A Wound Preparation: Ulcer Cleansing: Ulcer Cleansing: N/A Rinsed/Irrigated with Saline Rinsed/Irrigated with Saline Topical Anesthetic Applied: Topical Anesthetic Applied: Other: lidocaine 4% Other: lidocaine 4% Procedures Performed: Debridement Debridement N/A Treatment Notes Electronic Signature(s) Signed: 10/13/2017 3:30:58 PM By: Christin Fudge MD, FACS Entered By: Christin Fudge on 10/13/2017 15:30:56 [name] ([id number]) -------------------------------------------------------------------------------- [place] Details Patient Name: [name]. Date of Service: 10/13/2017 1:45 PM Medical Record Number: [id number] Patient Account Number: [id number] Date of Birth/Sex: [birth year] (82 y.o. Female) Treating RN: Roger Shelter Primary Care Christne Platts: [name] Other Clinician: Referring Margerie Fraiser: [name] Treating Hadja Harral/Extender: Frann Rider in Treatment: 32 Active Inactive ` Abuse / Safety / Falls / Self Care Management Nursing Diagnoses: Impaired physical mobility Potential for falls Goals: Patient will remain injury free Date Initiated: 09/06/2016 Target Resolution Date: 12/02/2016 Goal Status: Active Interventions: Assess fall risk on admission and as needed Assess self care needs on admission and as needed Notes: ` Necrotic Tissue Nursing Diagnoses: Impaired tissue integrity related to necrotic/devitalized tissue Goals: Necrotic/devitalized tissue will be minimized in the wound bed Date Initiated:  09/06/2016 Target Resolution Date: 12/02/2016 Goal Status: Active Interventions: Assess patient pain level pre-, during and post procedure and prior to discharge Notes: ` Orientation to the Wound Care Program Nursing Diagnoses: Knowledge deficit related to the wound healing center program Goals: Patient/caregiver will verbalize understanding of the [place] Program Date Initiated: 09/06/2016 Target Resolution Date: 12/02/2016 Goal Status: Active ALVENA, KIERNAN ([id number]) Interventions: Provide education on orientation to the wound center Notes: ` Pressure Nursing Diagnoses: Knowledge deficit related to management of pressures ulcers Potential for impaired tissue integrity related to pressure, friction, moisture, and shear Goals: Patient will remain free of pressure ulcers Date Initiated: 09/06/2016 Target Resolution Date: 12/02/2016 Goal Status: Active Interventions: Assess: immobility, friction, shearing, incontinence upon admission and as needed Notes: ` Soft Tissue Infection Nursing Diagnoses: Impaired tissue integrity Potential for infection: soft tissue Goals: Patient will remain free of wound infection Date Initiated: 09/06/2016 Target Resolution Date: 12/02/2016 Goal Status: Active Interventions: Assess signs and symptoms of infection every visit Notes: ` Wound/Skin Impairment Nursing Diagnoses: Impaired tissue integrity Goals: Ulcer/skin breakdown will heal within 14 weeks Date Initiated: 09/06/2016 Target Resolution Date: 12/16/2016 Goal Status: Active Interventions: Assess ulceration(s) every visit Notes: SKI, POLICH ([id number]) Electronic Signature(s) Signed: 10/14/2017 2:36:38 PM By: Roger Shelter Entered By: Roger Shelter on 10/13/2017 14:27:14 [name] ([id number]) -------------------------------------------------------------------------------- Pain Assessment Details Patient Name: [name]. Date of Service:  10/13/2017 1:45 PM Medical Record Number:  [id number] Patient Account Number: [id number] Date of Birth/Sex: April 25, 1931 (83 y.o. Female) Treating RN: Roger Shelter Primary Care Trevione Wert: [name] Other Clinician: Referring Gaby Harney: [name] Treating Rubbie Goostree/Extender: Frann Rider in Treatment: 51 Active Problems Location of Pain Severity and Description of Pain Patient Has Paino No Site Locations Pain Management and Medication Current Pain Management: Electronic Signature(s) Signed: 10/14/2017 2:36:38 PM By: Roger Shelter Entered By: Roger Shelter on 10/13/2017 14:09:53 [name] ([id number]) -------------------------------------------------------------------------------- Wound Assessment Details Patient Name: [name]. Date of Service: 10/13/2017 1:45 PM Medical Record Number: [id number] Patient Account Number: [id number] Date of Birth/Sex: [birth year] (83 y.o. Female) Treating RN: Roger Shelter Primary Care Talullah Abate: [name] Other Clinician: Referring Gay Moncivais: [name] Treating Raeshawn Vo/Extender: Frann Rider in Treatment: 52 Wound Status Wound Number: 2 Primary Etiology: Pressure Ulcer Wound Location: Left Calcaneus - Medial Wound Status: Open Wounding Event: Pressure Injury Comorbid History: Cataracts, Hypertension Date Acquired: 07/02/2016 Weeks Of Treatment: 57 Clustered Wound: No Photos Photo Uploaded By: Roger Shelter on 10/13/2017 16:48:33 Wound Measurements Length: (cm) 2.2 Width: (cm) 2.9 Depth: (cm) 0.4 Area: (cm) 5.011 Volume: (cm) 2.004 % Reduction in Area: 74.7% % Reduction in Volume: -1.3% Epithelialization: Small (1-33%) Tunneling: No Undermining: No Wound Description Classification: Category/Stage III Wound Margin: Flat and Intact Exudate Amount: Medium Exudate Type: Serosanguineous Exudate Color: red, brown Foul Odor After Cleansing: No Slough/Fibrino Yes Wound  Bed Granulation Amount: Medium (34-66%) Exposed Structure Granulation Quality: Red Fascia Exposed: No Necrotic Amount: Medium (34-66%) Fat Layer (Subcutaneous Tissue) Exposed: Yes Necrotic Quality: Adherent Slough Tendon Exposed: No Muscle Exposed: No Joint Exposed: No Bone Exposed: No Periwound Skin Texture Cadenhead, Fadia S. ([id number]) Texture Color No Abnormalities Noted: No No Abnormalities Noted: No Callus: Yes Atrophie Blanche: No Crepitus: No Cyanosis: No Excoriation: No Ecchymosis: No Induration: Yes Erythema: No Rash: No Hemosiderin Staining: No Scarring: Yes Mottled: No Pallor: No Moisture Rubor: No No Abnormalities Noted: No Dry / Scaly: No Maceration: No Wound Preparation Ulcer Cleansing: Rinsed/Irrigated with Saline Topical Anesthetic Applied: Other: lidocaine 4%, Electronic Signature(s) Signed: 10/14/2017 2:36:38 PM By: Roger Shelter Entered By: Roger Shelter on 10/13/2017 14:20:28 [name] ([id number]) -------------------------------------------------------------------------------- Wound Assessment Details Patient Name: [name]. Date of Service: 10/13/2017 1:45 PM Medical Record Number: [id number] Patient Account Number: [id number] Date of Birth/Sex: 10/26/1930 (83 y.o. Female) Treating RN: Roger Shelter Primary Care Ralphael Southgate: [name] Other Clinician: Referring Loraine Bhullar: [name] Treating Lesleyanne Politte/Extender: Frann Rider in Treatment: 75 Wound Status Wound Number: 8 Primary Etiology: Pressure Ulcer Wound Location: Right Calcaneus Wound Status: Open Wounding Event: Pressure Injury Comorbid History: Cataracts, Hypertension Date Acquired: 09/15/2017 Weeks Of Treatment: 2 Clustered Wound: No Photos Photo Uploaded By: Roger Shelter on 10/13/2017 16:48:33 Wound Measurements Length: (cm) 1.9 Width: (cm) 2.6 Depth: (cm) 0.4 Area: (cm) 3.88 Volume: (cm) 1.552 % Reduction in Area: -52.5% %  Reduction in Volume: -204.9% Epithelialization: None Tunneling: No Undermining: No Wound Description Classification: Category/Stage III Wound Margin: Flat and Intact Exudate Amount: Large Exudate Type: Serosanguineous Exudate Color: red, brown Foul Odor After Cleansing: No Slough/Fibrino Yes Wound Bed Granulation Amount: Medium (34-66%) Exposed Structure Granulation Quality: Red Fat Layer (Subcutaneous Tissue) Exposed: Yes Necrotic Amount: Medium (34-66%) Necrotic Quality: Eschar, Adherent Slough Periwound Skin Texture Texture Color No Abnormalities Noted: No No Abnormalities Noted: No Callus: Yes Atrophie Blanche: No YAZMINA, PAREJA. ([id number]) Crepitus: No Cyanosis: No Excoriation: No Ecchymosis: No Induration: Yes Erythema: No Rash: No Hemosiderin Staining: No Scarring: No Mottled: No Pallor: No Moisture Rubor: No  No Abnormalities Noted: No Dry / Scaly: No Temperature / Pain Maceration: No Temperature: No Abnormality Tenderness on Palpation: Yes Wound Preparation Ulcer Cleansing: Rinsed/Irrigated with Saline Topical Anesthetic Applied: Other: lidocaine 4%, Electronic Signature(s) Signed: 10/14/2017 2:36:38 PM By: Roger Shelter Entered By: Roger Shelter on 10/13/2017 14:21:07 [name] ([id number]) -------------------------------------------------------------------------------- Vitals Details Patient Name: [name] Date of Service: 10/13/2017 1:45 PM Medical Record Number: [id number] Patient Account Number: [id number] Date of Birth/Sex: [birth year] (83 y.o. Female) Treating RN: Roger Shelter Primary Care Shaterria Sager: [name] Other Clinician: Referring Kalla Watson: [name] Treating Rodrick Payson/Extender: Frann Rider in Treatment: 73 Vital Signs Time Taken: 02:09 Temperature (F): 98. Height (in): 63 Pulse (bpm): 58 Weight (lbs): 160 Respiratory Rate (breaths/min): 16 Body Mass Index (BMI): 28.3 Blood Pressure  (mmHg): 169/60 Reference Range: 80 - 120 mg / dl Electronic Signature(s) Signed: 10/14/2017 2:36:38 PM By: Roger Shelter Entered By: Roger Shelter on 10/13/2017 14:10:16
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
## 2017-10-26 ENCOUNTER — Ambulatory Visit: Payer: Medicare HMO | Admitting: Urology

## 2017-10-27 ENCOUNTER — Encounter: Payer: Medicare HMO | Attending: Physician Assistant | Admitting: Physician Assistant

## 2017-10-27 DIAGNOSIS — Z993 Dependence on wheelchair: Secondary | ICD-10-CM | POA: Diagnosis not present

## 2017-10-27 DIAGNOSIS — I129 Hypertensive chronic kidney disease with stage 1 through stage 4 chronic kidney disease, or unspecified chronic kidney disease: Secondary | ICD-10-CM | POA: Diagnosis not present

## 2017-10-27 DIAGNOSIS — L89613 Pressure ulcer of right heel, stage 3: Secondary | ICD-10-CM | POA: Diagnosis not present

## 2017-10-27 DIAGNOSIS — Z87891 Personal history of nicotine dependence: Secondary | ICD-10-CM | POA: Diagnosis not present

## 2017-10-27 DIAGNOSIS — L89623 Pressure ulcer of left heel, stage 3: Secondary | ICD-10-CM | POA: Insufficient documentation

## 2017-10-27 DIAGNOSIS — N189 Chronic kidney disease, unspecified: Secondary | ICD-10-CM | POA: Insufficient documentation
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
## 2017-10-29 NOTE — Progress Notes (Signed)
[name], [name] ([id number]) Visit Report for 10/27/2017 Arrival Information Details Patient Name: [name]. Date of Service: 10/27/2017 1:45 PM Medical Record Number: [id number] Patient Account Number: [id number] Date of Birth/Sex: [birth year] (84 y.o. Female) Treating RN: [name] Primary Care [name]: [name] Other Clinician: Referring [name]: [name] Treating [name]/Extender: [name], [name] in Treatment: 21 Visit Information History Since Last Visit Pain Present Now: No Patient Arrived: Wheel Chair Arrival Time: 14:05 Accompanied By: self Transfer Assistance: None Patient Requires Transmission-Based No Precautions: Patient Has Alerts: No Electronic Signature(s) Signed: 10/27/2017 5:02:00 PM By: [name] Entered By: [name] on 10/27/2017 14:05:31 [name] ([id number]) -------------------------------------------------------------------------------- Encounter Discharge Information Details Patient Name: [name]. Date of Service: 10/27/2017 1:45 PM Medical Record Number: [id number] Patient Account Number: [id number] Date of Birth/Sex: August 17, 1931 (84 y.o. Female) Treating RN: [name] Primary Care Aniken Monestime: [name] Other Clinician: Referring [name]: [name] Treating Mariyanna Mucha/Extender: [name], [name] in Treatment: 57 Encounter Discharge Information Items Discharge Pain Level: 0 Discharge Condition: Stable Ambulatory Status: Wheelchair Discharge Destination: Home Transportation: Private Auto Accompanied By: driver Schedule Follow-up Appointment: Yes Medication Reconciliation completed and No provided to Patient/Care [name]: Patient Clinical Summary of Care: Declined Electronic Signature(s) Signed: 10/27/2017 5:02:00 PM By: [name] Entered By: [name] on 10/27/2017 15:04:41 [name]  ([id number]) -------------------------------------------------------------------------------- Lower Extremity Assessment Details Patient Name: [name]. Date of Service: 10/27/2017 1:45 PM Medical Record Number: [id number] Patient Account Number: [id number] Date of Birth/Sex: 1931/09/19 (84 y.o. Female) Treating RN: [name] Primary Care [name]: [name] Other Clinician: Referring [name]: [name] Treating [name]/Extender: [name], [name] in Treatment: 59 Edema Assessment Assessed: [Left: No] [Right: No] Edema: [Left: No] [Right: No] Vascular Assessment Claudication: Claudication Assessment [Left:None] [Right:None] Pulses: Dorsalis Pedis Palpable: [Left:Yes] [Right:Yes] Posterior Tibial Extremity colors, hair growth, and conditions: Extremity Color: [Left:Normal] [Right:Normal] Hair Growth on Extremity: [Left:No] Temperature of Extremity: [Left:Warm] [Right:Warm] Capillary Refill: [Left:> 3 seconds] [Right:> 3 seconds] Toe Nail Assessment Left: Right: Thick: Yes Yes Discolored: Yes Yes Deformed: Yes Yes Improper Length and Hygiene: Yes Yes Electronic Signature(s) Signed: 10/27/2017 5:02:00 PM By: [name] Entered By: [name] on 10/27/2017 14:29:25 [name] ([id number]) -------------------------------------------------------------------------------- Pain Assessment Details Patient Name: [name]. Date of Service: 10/27/2017 1:45 PM Medical Record Number: [id number] Patient Account Number: [id number] Date of Birth/Sex: 06/28/1931 (84 y.o. Female) Treating RN: [name] Primary Care Antonea Gaut: [name] Other Clinician: Referring [name]: [name] Treating [name]/Extender: [name], [name] in Treatment: 33 Active Problems Location of Pain Severity and Description of Pain Patient Has Paino No Site Locations Pain Management and Medication Current Pain Management: Electronic  Signature(s) Signed: 10/27/2017 5:02:00 PM By: [name] Entered By: [name] on 10/27/2017 14:06:08 [name] ([id number]) -------------------------------------------------------------------------------- Patient/Caregiver Education Details Patient Name: [name]. Date of Service: 10/27/2017 1:45 PM Medical Record Number: [id number] Patient Account Number: [id number] Date of Birth/Gender: 1931/09/04 (84 y.o. Female) Treating RN: [name] Primary Care Physician: [name] Other Clinician: Referring Physician: Josephine Zuniga Treating Physician/Extender: [name] in Treatment: 68 Education Assessment Education Provided To: Patient Education Topics Provided Wound Debridement: Handouts: Wound Debridement Methods: Explain/Verbal Responses: State content correctly Wound/Skin Impairment: Handouts: Caring for Your Ulcer Methods: Explain/Verbal Responses: State content correctly Electronic Signature(s) Signed: 10/27/2017 5:02:00 PM By: [name] Entered By: [name] on 10/27/2017 15:05:00 [name] ([id number]) -------------------------------------------------------------------------------- Wound Assessment Details Patient Name: [name]. Date of Service: 10/27/2017 1:45  PM Medical Record Number: [id number] Patient Account Number: [id number] Date of Birth/Sex: June 14, 1931 (84 y.o. Female) Treating RN: [name] Primary Care [name]: [name] Other Clinician: Referring [name]: [name] Treating [name]/Extender: [name], [name] in Treatment: 59 Wound Status Wound Number: 2 Primary Etiology: Pressure Ulcer Wound Location: Left Calcaneus - Medial Wound Status: Open Wounding Event: Pressure Injury Comorbid History: Cataracts, Hypertension Date Acquired: 07/02/2016 Zuniga Of Treatment: 59 Clustered Wound: No Photos Photo Uploaded By: [name] on 10/27/2017  17:04:19 Wound Measurements Length: (cm) 1.9 Width: (cm) 1.8 Depth: (cm) 0.2 Area: (cm) 2.686 Volume: (cm) 0.537 % Reduction in Area: 86.4% % Reduction in Volume: 72.9% Epithelialization: Small (1-33%) Tunneling: No Undermining: No Wound Description Classification: Category/Stage III Wound Margin: Flat and Intact Exudate Amount: Medium Exudate Type: Serosanguineous Exudate Color: red, brown Foul Odor After Cleansing: No Slough/Fibrino Yes Wound Bed Granulation Amount: Medium (34-66%) Exposed Structure Granulation Quality: Red Fascia Exposed: No Necrotic Amount: Medium (34-66%) Fat Layer (Subcutaneous Tissue) Exposed: Yes Necrotic Quality: Adherent Slough Tendon Exposed: No Muscle Exposed: No Joint Exposed: No Bone Exposed: No Periwound Skin Texture [name], Danielle S. ([id number]) Texture Color No Abnormalities Noted: No No Abnormalities Noted: No Callus: Yes Atrophie Blanche: No Crepitus: No Cyanosis: No Excoriation: No Ecchymosis: No Induration: No Erythema: No Rash: No Hemosiderin Staining: No Scarring: Yes Mottled: No Pallor: No Moisture Rubor: No No Abnormalities Noted: No Dry / Scaly: No Temperature / Pain Maceration: No Temperature: No Abnormality Tenderness on Palpation: Yes Wound Preparation Ulcer Cleansing: Rinsed/Irrigated with Saline Topical Anesthetic Applied: Other: lidocaine 4%, Treatment Notes Wound #2 (Left, Medial Calcaneus) 1. Cleansed with: Clean wound with Normal Saline 2. Anesthetic Topical Lidocaine 4% cream to wound bed prior to debridement 4. Dressing Applied: Other dressing (specify in notes) Notes silvercell, allyvn heel booties, kerlix gauze wraps Electronic Signature(s) Signed: 10/27/2017 5:02:00 PM By: [name] Entered By: [name] on 10/27/2017 14:25:13 [name] ([id number]) -------------------------------------------------------------------------------- Wound Assessment Details Patient  Name: [name]. Date of Service: 10/27/2017 1:45 PM Medical Record Number: [id number] Patient Account Number: [id number] Date of Birth/Sex: 04/01/1931 (84 y.o. Female) Treating RN: [name] Primary Care Trentin Knappenberger: [name] Other Clinician: Referring Tremain Rucinski: [name] Treating Zayde Stroupe/Extender: [name], [name] in Treatment: 11 Wound Status Wound Number: 8 Primary Etiology: Pressure Ulcer Wound Location: Right Calcaneus Wound Status: Open Wounding Event: Pressure Injury Comorbid History: Cataracts, Hypertension Date Acquired: 09/15/2017 Zuniga Of Treatment: 4 Clustered Wound: No Photos Photo Uploaded By: [name] on 10/27/2017 17:04:20 Wound Measurements Length: (cm) 1 Width: (cm) 1.8 Depth: (cm) 0.2 Area: (cm) 1.414 Volume: (cm) 0.283 % Reduction in Area: 44.4% % Reduction in Volume: 44.4% Epithelialization: None Tunneling: No Undermining: No Wound Description Classification: Category/Stage III Wound Margin: Flat and Intact Exudate Amount: Medium Exudate Type: Serosanguineous Exudate Color: red, brown Foul Odor After Cleansing: No Slough/Fibrino Yes Wound Bed Granulation Amount: Medium (34-66%) Exposed Structure Granulation Quality: Red Fat Layer (Subcutaneous Tissue) Exposed: Yes Necrotic Amount: Medium (34-66%) Necrotic Quality: Eschar, Adherent Slough Periwound Skin Texture Texture Color No Abnormalities Noted: No No Abnormalities Noted: No Callus: Yes Atrophie Blanche: No [name], [name] ([id number]) Crepitus: No Cyanosis: No Excoriation: No Ecchymosis: No Induration: Yes Erythema: No Rash: No Hemosiderin Staining: No Scarring: Yes Mottled: No Pallor: No Moisture Rubor: No No Abnormalities Noted: No Dry / Scaly: No Temperature / Pain Maceration: No Temperature: No Abnormality Tenderness on Palpation: Yes Wound Preparation Ulcer Cleansing: Rinsed/Irrigated with Saline Topical Anesthetic  Applied: Other: lidocaine 4%, Treatment Notes Wound #  8 (Right Calcaneus) 1. Cleansed with: Clean wound with Normal Saline 2. Anesthetic Topical Lidocaine 4% cream to wound bed prior to debridement 4. Dressing Applied: Other dressing (specify in notes) Notes silvercell, allyvn heel booties, kerlix gauze wraps Electronic Signature(s) Signed: 10/27/2017 5:02:00 PM By: [name] Entered By: [name] on 10/27/2017 14:25:59 [name] ([id number]) -------------------------------------------------------------------------------- Wound Assessment Details Patient Name: [name]. Date of Service: 10/27/2017 1:45 PM Medical Record Number: [id number] Patient Account Number: [id number] Date of Birth/Sex: 28-Apr-1931 (84 y.o. Female) Treating RN: [name] Primary Care Yoshiharu Brassell: [name] Other Clinician: Referring Antonette Hendricks: [name] Treating Glyn Gerads/Extender: [name], [name] in Treatment: 59 Wound Status Wound Number: 9 Primary Etiology: Pressure Ulcer Wound Location: Right Foot - Plantar Wound Status: Open Wounding Event: Pressure Injury Comorbid History: Cataracts, Hypertension Date Acquired: 10/27/2017 Zuniga Of Treatment: 0 Clustered Wound: No Photos Photo Uploaded By: [name] on 10/27/2017 17:04:42 Wound Measurements Length: (cm) 0.7 % Reduct Width: (cm) 0.6 % Reduct Depth: (cm) 0.1 Epitheli Area: (cm) 0.33 Tunneli Volume: (cm) 0.033 Undermi ion in Area: 0% ion in Volume: 0% alization: None ng: No ning: No Wound Description Classification: Category/Stage II Wound Margin: Flat and Intact Exudate Amount: Medium Exudate Type: Serosanguineous Exudate Color: red, brown Foul Odor After Cleansing: No Slough/Fibrino Yes Wound Bed Granulation Amount: Small (1-33%) Exposed Structure Granulation Quality: Red Fascia Exposed: No Necrotic Amount: Large (67-100%) Fat Layer (Subcutaneous Tissue) Exposed: No Necrotic  Quality: Adherent Slough Tendon Exposed: No Muscle Exposed: No Joint Exposed: No Bone Exposed: No Periwound Skin Texture [name], Danielle S. ([id number]) Texture Color No Abnormalities Noted: No No Abnormalities Noted: No Callus: Yes Atrophie Blanche: No Crepitus: No Cyanosis: No Excoriation: No Ecchymosis: No Induration: No Erythema: No Rash: No Hemosiderin Staining: No Scarring: Yes Mottled: No Pallor: No Moisture Rubor: No No Abnormalities Noted: No Dry / Scaly: No Maceration: No Wound Preparation Ulcer Cleansing: Rinsed/Irrigated with Saline Topical Anesthetic Applied: Other: lidocaine 4%, Treatment Notes Wound #9 (Right, Plantar Foot) 1. Cleansed with: Clean wound with Normal Saline 2. Anesthetic Topical Lidocaine 4% cream to wound bed prior to debridement 4. Dressing Applied: Other dressing (specify in notes) Notes silvercell, allyvn heel booties, kerlix gauze wraps Electronic Signature(s) Signed: 10/27/2017 5:02:00 PM By: [name] Entered By: [name] on 10/27/2017 14:27:06 [name] ([id number]) -------------------------------------------------------------------------------- Vitals Details Patient Name: [name]. Date of Service: 10/27/2017 1:45 PM Medical Record Number: [id number] Patient Account Number: [id number] Date of Birth/Sex: April 09, 1931 (84 y.o. Female) Treating RN: [name] Primary Care Kellin Fifer: [name] Other Clinician: Referring Linzi Ohlinger: [name] Treating Abiola Behring/Extender: [name], [name] in Treatment: 59 Vital Signs Time Taken: 02:05 Temperature (F): 97.6 Height (in): 63 Pulse (bpm): 57 Weight (lbs): 160 Respiratory Rate (breaths/min): 16 Body Mass Index (BMI): 28.3 Blood Pressure (mmHg): 161/66 Reference Range: 80 - 120 mg / dl Electronic Signature(s) Signed: 10/27/2017 5:02:00 PM By: [name] Entered By: [name] on 10/27/2017 14:05:54

## 2017-11-10 ENCOUNTER — Encounter: Payer: Medicare HMO | Admitting: Nurse Practitioner

## 2017-11-10 DIAGNOSIS — L89623 Pressure ulcer of left heel, stage 3: Secondary | ICD-10-CM | POA: Diagnosis not present
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
## 2017-11-15 NOTE — Progress Notes (Signed)
KYNLEI, PIONTEK ([id number]) Visit Report for 11/10/2017 Arrival Information Details Patient Name: [name], [name]. Date of Service: 11/10/2017 1:45 PM Medical Record Number: [id number] Patient Account Number: [id number] Date of Birth/Sex: 1930/10/20 (84 y.o. Female) Treating RN: Roger Shelter Primary Care Paw Karstens: [name] Other Clinician: Referring Jhalen Eley: [name] Treating Zianna Dercole/Extender: Cathie Olden in Treatment: 65 Visit Information History Since Last Visit All ordered tests and consults were completed: No Patient Arrived: Wheel Chair Added or deleted any medications: No Arrival Time: 13:39 Any new allergies or adverse reactions: No Accompanied By: driver Had a fall or experienced change in No activities of daily living that may affect Transfer Assistance: Harrel Lemon Lift risk of falls: Patient Identification Verified: Yes Signs or symptoms of abuse/neglect since last visito No Secondary Verification Process Completed: Yes Hospitalized since last visit: No Patient Requires Transmission-Based No Pain Present Now: No Precautions: Patient Has Alerts: No Electronic Signature(s) Signed: 11/10/2017 4:47:24 PM By: Roger Shelter Entered By: Roger Shelter on 11/10/2017 13:48:11 Satterwhite, [name] ([id number]) -------------------------------------------------------------------------------- Clinic Level of Care Assessment Details Patient Name: [name]. Date of Service: 11/10/2017 1:45 PM Medical Record Number: [id number] Patient Account Number: [id number] Date of Birth/Sex: 29-Jul-1931 (84 y.o. Female) Treating RN: Roger Shelter Primary Care Steffi Noviello: [name] Other Clinician: Referring Shakhia Gramajo: [name] Treating Ilario Dhaliwal/Extender: Cathie Olden in Treatment: 81 Clinic Level of Care Assessment Items TOOL 4 Quantity Score []  - Use when only an EandM is performed on FOLLOW-UP visit 0 ASSESSMENTS - Nursing Assessment /  Reassessment []  - Reassessment of Co-morbidities (includes updates in patient status) 0 X- 1 5 Reassessment of Adherence to Treatment Plan ASSESSMENTS - Wound and Skin Assessment / Reassessment []  - Simple Wound Assessment / Reassessment - one wound 0 X- 2 5 Complex Wound Assessment / Reassessment - multiple wounds []  - 0 Dermatologic / Skin Assessment (not related to wound area) ASSESSMENTS - Focused Assessment []  - Circumferential Edema Measurements - multi extremities 0 []  - 0 Nutritional Assessment / Counseling / Intervention []  - 0 Lower Extremity Assessment (monofilament, tuning fork, pulses) []  - 0 Peripheral Arterial Disease Assessment (using hand held doppler) ASSESSMENTS - Ostomy and/or Continence Assessment and Care []  - Incontinence Assessment and Management 0 []  - 0 Ostomy Care Assessment and Management (repouching, etc.) PROCESS - Coordination of Care []  - Simple Patient / Family Education for ongoing care 0 X- 1 20 Complex (extensive) Patient / Family Education for ongoing care []  - 0 Staff obtains Programmer, systems, Records, Test Results / Process Orders []  - 0 Staff telephones HHA, Nursing Homes / Clarify orders / etc []  - 0 Routine Transfer to another Facility (non-emergent condition) []  - 0 Routine Hospital Admission (non-emergent condition) []  - 0 New Admissions / Biomedical engineer / Ordering NPWT, Apligraf, etc. []  - 0 Emergency Hospital Admission (emergent condition) []  - 0 Simple Discharge Coordination ANETTA, OLVERA ([id number]) X- 1 15 Complex (extensive) Discharge Coordination PROCESS - Special Needs []  - Pediatric / Minor Patient Management 0 []  - 0 Isolation Patient Management []  - 0 Hearing / Language / Visual special needs []  - 0 Assessment of Community assistance (transportation, D/C planning, etc.) []  - 0 Additional assistance / Altered mentation []  - 0 Support Surface(s) Assessment (bed, cushion, seat, etc.) INTERVENTIONS - Wound  Cleansing / Measurement []  - Simple Wound Cleansing - one wound 0 X- 2 5 Complex Wound Cleansing - multiple wounds X- 1 5 Wound Imaging (photographs - any number of wounds) []  - 0 Wound Tracing (instead of  photographs) []  - 0 Simple Wound Measurement - one wound X- 2 5 Complex Wound Measurement - multiple wounds INTERVENTIONS - Wound Dressings []  - Small Wound Dressing one or multiple wounds 0 X- 2 15 Medium Wound Dressing one or multiple wounds []  - 0 Large Wound Dressing one or multiple wounds []  - 0 Application of Medications - topical []  - 0 Application of Medications - injection INTERVENTIONS - Miscellaneous []  - External ear exam 0 []  - 0 Specimen Collection (cultures, biopsies, blood, body fluids, etc.) []  - 0 Specimen(s) / Culture(s) sent or taken to Lab for analysis []  - 0 Patient Transfer (multiple staff / Civil Service fast streamer / Similar devices) []  - 0 Simple Staple / Suture removal (25 or less) []  - 0 Complex Staple / Suture removal (26 or more) []  - 0 Hypo / Hyperglycemic Management (close monitor of Blood Glucose) []  - 0 Ankle / Brachial Index (ABI) - do not check if billed separately X- 1 5 Vital Signs Obeirne, Jasamine S. ([id number]) Has the patient been seen at the hospital within the last three years: Yes Total Score: 110 Level Of Care: New/Established - Level 3 Electronic Signature(s) Signed: 11/10/2017 4:47:24 PM By: Roger Shelter Entered By: Roger Shelter on 11/10/2017 14:41:25 [name] ([id number]) -------------------------------------------------------------------------------- Encounter Discharge Information Details Patient Name: [name]. Date of Service: 11/10/2017 1:45 PM Medical Record Number: [id number] Patient Account Number: [id number] Date of Birth/Sex: 1931-03-27 (84 y.o. Female) Treating RN: Roger Shelter Primary Care Maxamillian Tienda: [name] Other Clinician: Referring Harrie Cazarez: [name] Treating Koehn Salehi/Extender:  Cathie Olden in Treatment: 42 Encounter Discharge Information Items Discharge Pain Level: 0 Discharge Condition: Stable Ambulatory Status: Wheelchair Other (Note Discharge Destination: Required) Transportation: Private Auto Accompanied By: driver Schedule Follow-up Appointment: Yes Medication Reconciliation completed and No provided to Patient/Care Braelynn Benning: Provided on Clinical Summary of Care: 11/10/2017 Form Type Recipient Paper Patient EB Electronic Signature(s) Signed: 11/15/2017 10:47:22 AM By: Ruthine Dose Entered By: Ruthine Dose on 11/10/2017 14:46:43 Ressler, [name] ([id number]) -------------------------------------------------------------------------------- Lower Extremity Assessment Details Patient Name: [name]. Date of Service: 11/10/2017 1:45 PM Medical Record Number: [id number] Patient Account Number: [id number] Date of Birth/Sex: [birth year] (84 y.o. Female) Treating RN: Roger Shelter Primary Care Minahil Quinlivan: [name] Other Clinician: Referring Bonnetta Allbee: [name] Treating Laurin Morgenstern/Extender: Cathie Olden in Treatment: 61 Edema Assessment Assessed: [Left: No] [Right: No] Edema: [Left: No] [Right: No] Vascular Assessment Claudication: Claudication Assessment [Left:None] [Right:None] Pulses: Dorsalis Pedis Palpable: [Left:Yes] [Right:Yes] Posterior Tibial Extremity colors, hair growth, and conditions: Extremity Color: [Left:Hyperpigmented] [Right:Hyperpigmented] Hair Growth on Extremity: [Left:No] [Right:No] Temperature of Extremity: [Left:Warm] [Right:Warm] Capillary Refill: [Left:> 3 seconds] [Right:> 3 seconds] Toe Nail Assessment Left: Right: Thick: Yes Yes Discolored: Yes Yes Deformed: No No Improper Length and Hygiene: Yes Yes Electronic Signature(s) Signed: 11/10/2017 4:47:24 PM By: Roger Shelter Entered By: Roger Shelter on 11/10/2017 14:08:40 [name]  ([id number]) -------------------------------------------------------------------------------- Multi Wound Chart Details Patient Name: [name]. Date of Service: 11/10/2017 1:45 PM Medical Record Number: [id number] Patient Account Number: [id number] Date of Birth/Sex: August 11, 1931 (84 y.o. Female) Treating RN: Roger Shelter Primary Care Sabastien Tyler: [name] Other Clinician: Referring Nihira Puello: [name] Treating Anah Billard/Extender: Cathie Olden in Treatment: 62 Vital Signs Height(in): 63 Pulse(bpm): 63 Weight(lbs): 160 Blood Pressure(mmHg): 142/57 Body Mass Index(BMI): 28 Temperature(F): 98.6 Respiratory Rate 16 (breaths/min): Photos: [2:No Photos] [8:No Photos] [9:No Photos] Wound Location: [2:Left Calcaneus - Medial] [8:Right Calcaneus] [9:Right Foot - Plantar] Wounding Event: [2:Pressure Injury] [8:Pressure Injury] [9:Pressure Injury] Primary Etiology: [2:Pressure Ulcer] [  8:Pressure Ulcer] [9:Pressure Ulcer] Comorbid History: [2:Cataracts, Hypertension, Peripheral Venous Disease] [8:Cataracts, Hypertension, Peripheral Venous Disease] [9:Cataracts, Hypertension, Peripheral Venous Disease] Date Acquired: [2:07/02/2016] [8:09/15/2017] [9:10/27/2017] Weeks of Treatment: [2:61] [8:6] [9:2] Wound Status: [2:Open] [8:Open] [9:Open] Measurements L x W x D [2:1.5x0.5x0.2] [8:0.7x1.6x0.2] [9:1x0.8x0.1] (cm) Area (cm) : [2:0.589] [8:0.88] [9:0.628] Volume (cm) : [2:0.118] [8:0.176] [9:0.063] % Reduction in Area: [2:97.00%] [8:65.40%] [9:-90.30%] % Reduction in Volume: [2:94.00%] [8:65.40%] [9:-90.90%] Classification: [2:Category/Stage III] [8:Category/Stage III] [9:Category/Stage II] Exudate Amount: [2:Medium] [8:Medium] [9:Medium] Exudate Type: [2:Serosanguineous] [8:Serosanguineous] [9:Serosanguineous] Exudate Color: [2:red, brown] [8:red, brown] [9:red, brown] Wound Margin: [2:Flat and Intact] [8:Flat and Intact] [9:Flat and Intact] Granulation Amount:  [2:Medium (34-66%)] [8:Medium (34-66%)] [9:Small (1-33%)] Granulation Quality: [2:Red] [8:Red] [9:Red] Necrotic Amount: [2:Medium (34-66%)] [8:Medium (34-66%)] [9:Large (67-100%)] Necrotic Tissue: [2:Eschar, Adherent Slough] [8:Eschar, Adherent Slough] [9:Eschar, Adherent Slough] Exposed Structures: [2:Fat Layer (Subcutaneous Tissue) Exposed: Yes Fascia: No Tendon: No Muscle: No Joint: No Bone: No] [8:Fat Layer (Subcutaneous Tissue) Exposed: Yes] [9:Fascia: No Fat Layer (Subcutaneous Tissue) Exposed: No Tendon: No Muscle: No Joint: No Bone: No] Epithelialization: [2:Medium (34-66%)] [8:Medium (34-66%)] [9:Medium (34-66%)] Periwound Skin Texture: [2:Callus: Yes Scarring: Yes Excoriation: No Induration: No] [8:Induration: Yes Callus: Yes Scarring: Yes Excoriation: No] [9:Callus: Yes Scarring: Yes Excoriation: No Induration: No] Crepitus: No Crepitus: No Crepitus: No Rash: No Rash: No Rash: No Periwound Skin Moisture: Dry/Scaly: Yes Dry/Scaly: Yes Maceration: No Maceration: No Maceration: No Dry/Scaly: No Periwound Skin Color: Atrophie Blanche: No Atrophie Blanche: No Atrophie Blanche: No Cyanosis: No Cyanosis: No Cyanosis: No Ecchymosis: No Ecchymosis: No Ecchymosis: No Erythema: No Erythema: No Erythema: No Hemosiderin Staining: No Hemosiderin Staining: No Hemosiderin Staining: No Mottled: No Mottled: No Mottled: No Pallor: No Pallor: No Pallor: No Rubor: No Rubor: No Rubor: No Temperature: No Abnormality No Abnormality N/A Tenderness on Palpation: Yes Yes No Wound Preparation: Ulcer Cleansing: Ulcer Cleansing: Ulcer Cleansing: Rinsed/Irrigated with Saline Rinsed/Irrigated with Saline Rinsed/Irrigated with Saline Topical Anesthetic Applied: Topical Anesthetic Applied: Topical Anesthetic Applied: Other: lidocaine 4% Other: lidocaine 4% Other: lidocaine 4% Treatment Notes Electronic Signature(s) Signed: 11/10/2017 4:47:24 PM By: Roger Shelter Entered By:  Roger Shelter on 11/10/2017 14:39:11 Dolce, [name] ([id number]) -------------------------------------------------------------------------------- [place] Details Patient Name: [name]. Date of Service: 11/10/2017 1:45 PM Medical Record Number: [id number] Patient Account Number: [id number] Date of Birth/Sex: 1931/07/20 (84 y.o. Female) Treating RN: Roger Shelter Primary Care Berlie Hatchel: [name] Other Clinician: Referring Mehek Grega: [name] Treating Jaquesha Boroff/Extender: Cathie Olden in Treatment: 56 Active Inactive ` Abuse / Safety / Falls / Self Care Management Nursing Diagnoses: Impaired physical mobility Potential for falls Goals: Patient will remain injury free Date Initiated: 09/06/2016 Target Resolution Date: 12/02/2016 Goal Status: Active Interventions: Assess fall risk on admission and as needed Assess self care needs on admission and as needed Notes: ` Necrotic Tissue Nursing Diagnoses: Impaired tissue integrity related to necrotic/devitalized tissue Goals: Necrotic/devitalized tissue will be minimized in the wound bed Date Initiated: 09/06/2016 Target Resolution Date: 12/02/2016 Goal Status: Active Interventions: Assess patient pain level pre-, during and post procedure and prior to discharge Notes: ` Orientation to the Wound Care Program Nursing Diagnoses: Knowledge deficit related to the wound healing center program Goals: Patient/caregiver will verbalize understanding of the [facility] Date Initiated: 09/06/2016 Target Resolution Date: 12/02/2016 Goal Status: Active LAIBA, FUERTE ([id number]) Interventions: Provide education on orientation to the wound center Notes: ` Pressure Nursing Diagnoses: Knowledge deficit related to management of pressures ulcers Potential for impaired tissue integrity related to pressure, friction,  moisture, and shear Goals: Patient will remain free of  pressure ulcers Date Initiated: 09/06/2016 Target Resolution Date: 12/02/2016 Goal Status: Active Interventions: Assess: immobility, friction, shearing, incontinence upon admission and as needed Notes: ` Soft Tissue Infection Nursing Diagnoses: Impaired tissue integrity Potential for infection: soft tissue Goals: Patient will remain free of wound infection Date Initiated: 09/06/2016 Target Resolution Date: 12/02/2016 Goal Status: Active Interventions: Assess signs and symptoms of infection every visit Notes: ` Wound/Skin Impairment Nursing Diagnoses: Impaired tissue integrity Goals: Ulcer/skin breakdown will heal within 14 weeks Date Initiated: 09/06/2016 Target Resolution Date: 12/16/2016 Goal Status: Active Interventions: Assess ulceration(s) every visit Notes: KHAYLEE, MCEVOY ([id number]) Electronic Signature(s) Signed: 11/10/2017 4:47:24 PM By: Roger Shelter Entered By: Roger Shelter on 11/10/2017 14:39:02 [name] ([id number]) -------------------------------------------------------------------------------- Pain Assessment Details Patient Name: [name]. Date of Service: 11/10/2017 1:45 PM Medical Record Number: [id number] Patient Account Number: [id number] Date of Birth/Sex: 1931/06/28 (84 y.o. Female) Treating RN: Roger Shelter Primary Care Jafeth Mustin: [name] Other Clinician: Referring Shauniece Kwan: [name] Treating Kennidy Lamke/Extender: Cathie Olden in Treatment: 29 Active Problems Location of Pain Severity and Description of Pain Patient Has Paino No Site Locations Pain Management and Medication Current Pain Management: Electronic Signature(s) Signed: 11/10/2017 4:47:24 PM By: Roger Shelter Entered By: Roger Shelter on 11/10/2017 13:48:19 [name] ([id number]) -------------------------------------------------------------------------------- Patient/Caregiver Education Details Patient Name: [name]. Date of Service: 11/10/2017 1:45 PM Medical Record Number: [id number] Patient Account Number: [id number] Date of Birth/Gender: 03/25/1931 (84 y.o. Female) Treating RN: Roger Shelter Primary Care Physician: [name] Other Clinician: Referring Physician: [name] Treating Physician/Extender: Cathie Olden in Treatment: 71 Education Assessment Education Provided To: Patient Education Topics Provided Wound/Skin Impairment: Handouts: Caring for Your Ulcer Methods: Explain/Verbal Responses: State content correctly Electronic Signature(s) Signed: 11/10/2017 4:47:24 PM By: Roger Shelter Entered By: Roger Shelter on 11/10/2017 14:43:48 Dottavio, [name] ([id number]) -------------------------------------------------------------------------------- Wound Assessment Details Patient Name: [name]. Date of Service: 11/10/2017 1:45 PM Medical Record Number: [id number] Patient Account Number: [id number] Date of Birth/Sex: 1931/08/18 (84 y.o. Female) Treating RN: Roger Shelter Primary Care Amillia Biffle: [name] Other Clinician: Referring Jazlene Bares: [name] Treating Ahmia Colford/Extender: Cathie Olden in Treatment: 61 Wound Status Wound Number: 2 Primary Pressure Ulcer Etiology: Wound Location: Left Calcaneus - Medial Wound Status: Open Wounding Event: Pressure Injury Comorbid Cataracts, Hypertension, Peripheral Venous Date Acquired: 07/02/2016 History: Disease Weeks Of Treatment: 61 Clustered Wound: No Photos Photo Uploaded By: Roger Shelter on 11/10/2017 16:43:17 Wound Measurements Length: (cm) 1.5 Width: (cm) 0.5 Depth: (cm) 0.2 Area: (cm) 0.589 Volume: (cm) 0.118 % Reduction in Area: 97% % Reduction in Volume: 94% Epithelialization: Medium (34-66%) Tunneling: No Undermining: No Wound Description Classification: Category/Stage III Wound Margin: Flat and Intact Exudate Amount: Medium Exudate Type:  Serosanguineous Exudate Color: red, brown Foul Odor After Cleansing: No Slough/Fibrino Yes Wound Bed Granulation Amount: Medium (34-66%) Exposed Structure Granulation Quality: Red Fascia Exposed: No Necrotic Amount: Medium (34-66%) Fat Layer (Subcutaneous Tissue) Exposed: Yes Necrotic Quality: Eschar, Adherent Slough Tendon Exposed: No Muscle Exposed: No Joint Exposed: No Bone Exposed: No Periwound Skin Texture Corrales, Jacee S. ([id number]) Texture Color No Abnormalities Noted: No No Abnormalities Noted: No Callus: Yes Atrophie Blanche: No Crepitus: No Cyanosis: No Excoriation: No Ecchymosis: No Induration: No Erythema: No Rash: No Hemosiderin Staining: No Scarring: Yes Mottled: No Pallor: No Moisture Rubor: No No Abnormalities Noted: No Dry / Scaly: Yes Temperature / Pain Maceration: No Temperature: No Abnormality Tenderness on Palpation: Yes Wound Preparation Ulcer Cleansing:  Rinsed/Irrigated with Saline Topical Anesthetic Applied: Other: lidocaine 4%, Treatment Notes Wound #2 (Left, Medial Calcaneus) 1. Cleansed with: Clean wound with Normal Saline 2. Anesthetic Topical Lidocaine 4% cream to wound bed prior to debridement 4. Dressing Applied: Other dressing (specify in notes) Notes silvercell and heel cups bilateral with kerlix wrap and tape Electronic Signature(s) Signed: 11/10/2017 4:47:24 PM By: Roger Shelter Entered By: Roger Shelter on 11/10/2017 14:06:37 Kundrat, [name] ([id number]) -------------------------------------------------------------------------------- Wound Assessment Details Patient Name: [name]. Date of Service: 11/10/2017 1:45 PM Medical Record Number: [id number] Patient Account Number: [id number] Date of Birth/Sex: April 18, 1931 (84 y.o. Female) Treating RN: Roger Shelter Primary Care Merilynn Haydu: [name] Other Clinician: Referring Caio Devera: [name] Treating Dartagnan Beavers/Extender: Cathie Olden in  Treatment: 61 Wound Status Wound Number: 8 Primary Pressure Ulcer Etiology: Wound Location: Right Calcaneus Wound Status: Open Wounding Event: Pressure Injury Comorbid Cataracts, Hypertension, Peripheral Venous Date Acquired: 09/15/2017 History: Disease Weeks Of Treatment: 6 Clustered Wound: No Photos Photo Uploaded By: Roger Shelter on 11/10/2017 16:43:18 Wound Measurements Length: (cm) 0.7 Width: (cm) 1.6 Depth: (cm) 0.2 Area: (cm) 0.88 Volume: (cm) 0.176 % Reduction in Area: 65.4% % Reduction in Volume: 65.4% Epithelialization: Medium (34-66%) Tunneling: No Undermining: No Wound Description Classification: Category/Stage III Wound Margin: Flat and Intact Exudate Amount: Medium Exudate Type: Serosanguineous Exudate Color: red, brown Foul Odor After Cleansing: No Slough/Fibrino Yes Wound Bed Granulation Amount: Medium (34-66%) Exposed Structure Granulation Quality: Red Fat Layer (Subcutaneous Tissue) Exposed: Yes Necrotic Amount: Medium (34-66%) Necrotic Quality: Eschar, Adherent Slough Periwound Skin Texture Texture Color No Abnormalities Noted: No No Abnormalities Noted: No Callus: Yes Atrophie Blanche: No NAKEYSHA, PASQUAL ([id number]) Crepitus: No Cyanosis: No Excoriation: No Ecchymosis: No Induration: Yes Erythema: No Rash: No Hemosiderin Staining: No Scarring: Yes Mottled: No Pallor: No Moisture Rubor: No No Abnormalities Noted: No Dry / Scaly: Yes Temperature / Pain Maceration: No Temperature: No Abnormality Tenderness on Palpation: Yes Wound Preparation Ulcer Cleansing: Rinsed/Irrigated with Saline Topical Anesthetic Applied: Other: lidocaine 4%, Treatment Notes Wound #8 (Right Calcaneus) 1. Cleansed with: Clean wound with Normal Saline 2. Anesthetic Topical Lidocaine 4% cream to wound bed prior to debridement 4. Dressing Applied: Other dressing (specify in notes) Notes silvercell and heel cups bilateral with kerlix wrap and  tape Electronic Signature(s) Signed: 11/10/2017 4:47:24 PM By: Roger Shelter Entered By: Roger Shelter on 11/10/2017 14:07:14 [name] ([id number]) -------------------------------------------------------------------------------- Wound Assessment Details Patient Name: [name]. Date of Service: 11/10/2017 1:45 PM Medical Record Number: [id number] Patient Account Number: [id number] Date of Birth/Sex: [birth year] (84 y.o. Female) Treating RN: Roger Shelter Primary Care Alissa Pharr: [name] Other Clinician: Referring Jamariah Tony: [name] Treating Jacqulene Huntley/Extender: Cathie Olden in Treatment: 61 Wound Status Wound Number: 9 Primary Pressure Ulcer Etiology: Wound Location: Right Foot - Plantar Wound Status: Open Wounding Event: Pressure Injury Comorbid Cataracts, Hypertension, Peripheral Venous Date Acquired: 10/27/2017 History: Disease Weeks Of Treatment: 2 Clustered Wound: No Photos Photo Uploaded By: Roger Shelter on 11/10/2017 16:43:38 Wound Measurements Length: (cm) 1 Width: (cm) 0.8 Depth: (cm) 0.1 Area: (cm) 0.628 Volume: (cm) 0.063 % Reduction in Area: -90.3% % Reduction in Volume: -90.9% Epithelialization: Medium (34-66%) Tunneling: No Undermining: No Wound Description Classification: Category/Stage II Wound Margin: Flat and Intact Exudate Amount: Medium Exudate Type: Serosanguineous Exudate Color: red, brown Foul Odor After Cleansing: No Slough/Fibrino Yes Wound Bed Granulation Amount: Small (1-33%) Exposed Structure Granulation Quality: Red Fascia Exposed: No Necrotic Amount: Large (67-100%) Fat Layer (Subcutaneous Tissue) Exposed: No Necrotic Quality: Eschar, Adherent Slough  Tendon Exposed: No Muscle Exposed: No Joint Exposed: No Bone Exposed: No Periwound Skin Texture Lehrke, Takeyla S. ([id number]) Texture Color No Abnormalities Noted: No No Abnormalities Noted: No Callus: Yes Atrophie Blanche:  No Crepitus: No Cyanosis: No Excoriation: No Ecchymosis: No Induration: No Erythema: No Rash: No Hemosiderin Staining: No Scarring: Yes Mottled: No Pallor: No Moisture Rubor: No No Abnormalities Noted: No Dry / Scaly: No Maceration: No Wound Preparation Ulcer Cleansing: Rinsed/Irrigated with Saline Topical Anesthetic Applied: Other: lidocaine 4%, Treatment Notes Wound #9 (Right, Plantar Foot) 1. Cleansed with: Clean wound with Normal Saline 2. Anesthetic Topical Lidocaine 4% cream to wound bed prior to debridement 4. Dressing Applied: Other dressing (specify in notes) Notes silvercell and heel cups bilateral with kerlix wrap and tape Electronic Signature(s) Signed: 11/10/2017 4:47:24 PM By: Roger Shelter Entered By: Roger Shelter on 11/10/2017 14:07:41 Fine, [name] ([id number]) -------------------------------------------------------------------------------- Vitals Details Patient Name: [name]. Date of Service: 11/10/2017 1:45 PM Medical Record Number: [id number] Patient Account Number: [id number] Date of Birth/Sex: 1930/12/05 (84 y.o. Female) Treating RN: Roger Shelter Primary Care Antonetta Clanton: [name] Other Clinician: Referring Lamika Connolly: [name] Treating Jazmen Lindenbaum/Extender: Cathie Olden in Treatment: 57 Vital Signs Time Taken: 01:48 Temperature (F): 98.6 Height (in): 63 Pulse (bpm): 63 Weight (lbs): 160 Respiratory Rate (breaths/min): 16 Body Mass Index (BMI): 28.3 Blood Pressure (mmHg): 142/57 Reference Range: 80 - 120 mg / dl Electronic Signature(s) Signed: 11/10/2017 4:47:24 PM By: Roger Shelter Entered By: Roger Shelter on 11/10/2017 13:48:51

## 2017-11-17 ENCOUNTER — Ambulatory Visit: Payer: Medicare HMO | Admitting: Nurse Practitioner

## 2017-11-24 ENCOUNTER — Ambulatory Visit: Payer: Medicare HMO | Admitting: Nurse Practitioner

## 2017-12-01 ENCOUNTER — Encounter: Payer: Medicare HMO | Attending: Nurse Practitioner | Admitting: Nurse Practitioner

## 2017-12-01 DIAGNOSIS — I129 Hypertensive chronic kidney disease with stage 1 through stage 4 chronic kidney disease, or unspecified chronic kidney disease: Secondary | ICD-10-CM | POA: Insufficient documentation

## 2017-12-01 DIAGNOSIS — Z8249 Family history of ischemic heart disease and other diseases of the circulatory system: Secondary | ICD-10-CM | POA: Insufficient documentation

## 2017-12-01 DIAGNOSIS — D631 Anemia in chronic kidney disease: Secondary | ICD-10-CM | POA: Insufficient documentation

## 2017-12-01 DIAGNOSIS — I83024 Varicose veins of left lower extremity with ulcer of heel and midfoot: Secondary | ICD-10-CM | POA: Insufficient documentation

## 2017-12-01 DIAGNOSIS — N189 Chronic kidney disease, unspecified: Secondary | ICD-10-CM | POA: Insufficient documentation

## 2017-12-01 DIAGNOSIS — I739 Peripheral vascular disease, unspecified: Secondary | ICD-10-CM | POA: Insufficient documentation

## 2017-12-01 DIAGNOSIS — Z993 Dependence on wheelchair: Secondary | ICD-10-CM | POA: Diagnosis not present

## 2017-12-01 DIAGNOSIS — Z7401 Bed confinement status: Secondary | ICD-10-CM | POA: Diagnosis not present

## 2017-12-01 DIAGNOSIS — Z96653 Presence of artificial knee joint, bilateral: Secondary | ICD-10-CM | POA: Insufficient documentation

## 2017-12-01 DIAGNOSIS — Z87891 Personal history of nicotine dependence: Secondary | ICD-10-CM | POA: Insufficient documentation

## 2017-12-01 DIAGNOSIS — L89613 Pressure ulcer of right heel, stage 3: Secondary | ICD-10-CM | POA: Insufficient documentation

## 2017-12-01 DIAGNOSIS — L89623 Pressure ulcer of left heel, stage 3: Secondary | ICD-10-CM | POA: Diagnosis not present

## 2017-12-03 ENCOUNTER — Emergency Department: Payer: Medicare HMO

## 2017-12-03 ENCOUNTER — Observation Stay
Admission: EM | Admit: 2017-12-03 | Discharge: 2017-12-06 | Disposition: A | Discharge status: Home / Self Care | Payer: Medicare HMO | Attending: Internal Medicine | Admitting: Internal Medicine

## 2017-12-03 ENCOUNTER — Encounter: Payer: Self-pay | Admitting: Emergency Medicine

## 2017-12-03 ENCOUNTER — Other Ambulatory Visit: Payer: Self-pay

## 2017-12-03 DIAGNOSIS — R262 Difficulty in walking, not elsewhere classified: Secondary | ICD-10-CM | POA: Insufficient documentation

## 2017-12-03 DIAGNOSIS — I700 Atherosclerosis of aorta: Secondary | ICD-10-CM | POA: Insufficient documentation

## 2017-12-03 DIAGNOSIS — R0989 Other specified symptoms and signs involving the circulatory and respiratory systems: Secondary | ICD-10-CM | POA: Diagnosis not present

## 2017-12-03 DIAGNOSIS — M6281 Muscle weakness (generalized): Secondary | ICD-10-CM | POA: Diagnosis not present

## 2017-12-03 DIAGNOSIS — D649 Anemia, unspecified: Secondary | ICD-10-CM | POA: Insufficient documentation

## 2017-12-03 DIAGNOSIS — Z79899 Other long term (current) drug therapy: Secondary | ICD-10-CM | POA: Diagnosis not present

## 2017-12-03 DIAGNOSIS — H6980 Other specified disorders of Eustachian tube, unspecified ear: Secondary | ICD-10-CM | POA: Insufficient documentation

## 2017-12-03 DIAGNOSIS — E860 Dehydration: Secondary | ICD-10-CM | POA: Insufficient documentation

## 2017-12-03 DIAGNOSIS — G9340 Encephalopathy, unspecified: Secondary | ICD-10-CM | POA: Diagnosis not present

## 2017-12-03 DIAGNOSIS — I10 Essential (primary) hypertension: Secondary | ICD-10-CM | POA: Diagnosis present

## 2017-12-03 DIAGNOSIS — I739 Peripheral vascular disease, unspecified: Secondary | ICD-10-CM | POA: Insufficient documentation

## 2017-12-03 DIAGNOSIS — M479 Spondylosis, unspecified: Secondary | ICD-10-CM | POA: Insufficient documentation

## 2017-12-03 DIAGNOSIS — I129 Hypertensive chronic kidney disease with stage 1 through stage 4 chronic kidney disease, or unspecified chronic kidney disease: Secondary | ICD-10-CM | POA: Insufficient documentation

## 2017-12-03 DIAGNOSIS — K222 Esophageal obstruction: Secondary | ICD-10-CM | POA: Diagnosis not present

## 2017-12-03 DIAGNOSIS — L97429 Non-pressure chronic ulcer of left heel and midfoot with unspecified severity: Secondary | ICD-10-CM | POA: Insufficient documentation

## 2017-12-03 DIAGNOSIS — S2242XA Multiple fractures of ribs, left side, initial encounter for closed fracture: Secondary | ICD-10-CM | POA: Diagnosis not present

## 2017-12-03 DIAGNOSIS — E876 Hypokalemia: Secondary | ICD-10-CM | POA: Diagnosis not present

## 2017-12-03 DIAGNOSIS — Z90710 Acquired absence of both cervix and uterus: Secondary | ICD-10-CM | POA: Insufficient documentation

## 2017-12-03 DIAGNOSIS — Z96653 Presence of artificial knee joint, bilateral: Secondary | ICD-10-CM | POA: Insufficient documentation

## 2017-12-03 DIAGNOSIS — K449 Diaphragmatic hernia without obstruction or gangrene: Secondary | ICD-10-CM | POA: Diagnosis not present

## 2017-12-03 DIAGNOSIS — R1310 Dysphagia, unspecified: Secondary | ICD-10-CM | POA: Diagnosis not present

## 2017-12-03 DIAGNOSIS — T182XXA Foreign body in stomach, initial encounter: Secondary | ICD-10-CM | POA: Diagnosis not present

## 2017-12-03 DIAGNOSIS — Z87891 Personal history of nicotine dependence: Secondary | ICD-10-CM | POA: Insufficient documentation

## 2017-12-03 DIAGNOSIS — N183 Chronic kidney disease, stage 3 (moderate): Secondary | ICD-10-CM | POA: Insufficient documentation

## 2017-12-03 DIAGNOSIS — X58XXXS Exposure to other specified factors, sequela: Secondary | ICD-10-CM | POA: Diagnosis not present

## 2017-12-03 DIAGNOSIS — N390 Urinary tract infection, site not specified: Secondary | ICD-10-CM | POA: Diagnosis present

## 2017-12-03 DIAGNOSIS — Z8673 Personal history of transient ischemic attack (TIA), and cerebral infarction without residual deficits: Secondary | ICD-10-CM | POA: Insufficient documentation

## 2017-12-03 DIAGNOSIS — E871 Hypo-osmolality and hyponatremia: Secondary | ICD-10-CM | POA: Insufficient documentation

## 2017-12-03 DIAGNOSIS — M810 Age-related osteoporosis without current pathological fracture: Secondary | ICD-10-CM | POA: Insufficient documentation

## 2017-12-03 DIAGNOSIS — I452 Bifascicular block: Secondary | ICD-10-CM | POA: Insufficient documentation

## 2017-12-03 LAB — URINALYSIS, ROUTINE W REFLEX MICROSCOPIC
Bacteria, UA: NONE SEEN
Bilirubin Urine: NEGATIVE
GLUCOSE, UA: NEGATIVE mg/dL
Ketones, ur: 20 mg/dL — AB
NITRITE: POSITIVE — AB
Protein, ur: 100 mg/dL — AB
SPECIFIC GRAVITY, URINE: 1.011 (ref 1.005–1.030)
Squamous Epithelial / LPF: NONE SEEN
pH: 7 (ref 5.0–8.0)

## 2017-12-03 LAB — COMPREHENSIVE METABOLIC PANEL
ALK PHOS: 93 U/L (ref 38–126)
ALT: 16 U/L (ref 14–54)
AST: 27 U/L (ref 15–41)
Albumin: 3.6 g/dL (ref 3.5–5.0)
Anion gap: 16 — ABNORMAL HIGH (ref 5–15)
BILIRUBIN TOTAL: 1.1 mg/dL (ref 0.3–1.2)
BUN: 27 mg/dL — ABNORMAL HIGH (ref 6–20)
CALCIUM: 9.8 mg/dL (ref 8.9–10.3)
CHLORIDE: 91 mmol/L — AB (ref 101–111)
CO2: 23 mmol/L (ref 22–32)
CREATININE: 1.05 mg/dL — AB (ref 0.44–1.00)
GFR, EST AFRICAN AMERICAN: 55 mL/min — AB (ref >60)
GFR, EST NON AFRICAN AMERICAN: 47 mL/min — AB (ref >60)
Glucose, Bld: 98 mg/dL (ref 65–99)
Potassium: 3.4 mmol/L — ABNORMAL LOW (ref 3.5–5.1)
Sodium: 130 mmol/L — ABNORMAL LOW (ref 135–145)
TOTAL PROTEIN: 8.7 g/dL — AB (ref 6.5–8.1)

## 2017-12-03 LAB — CBC WITH DIFFERENTIAL/PLATELET
Band Neutrophils: 0 %
Basophils Absolute: 0 10*3/uL (ref 0–0.1)
Basophils Relative: 0 %
Blasts: 0 %
EOS PCT: 1 %
Eosinophils Absolute: 0.1 10*3/uL (ref 0–0.7)
HCT: 35.6 % (ref 35.0–47.0)
Hemoglobin: 11.7 g/dL — ABNORMAL LOW (ref 12.0–16.0)
LYMPHS ABS: 2.8 10*3/uL (ref 1.0–3.6)
Lymphocytes Relative: 25 %
MCH: 29.6 pg (ref 26.0–34.0)
MCHC: 33 g/dL (ref 32.0–36.0)
MCV: 89.8 fL (ref 80.0–100.0)
MONOS PCT: 12 %
MYELOCYTES: 0 %
Metamyelocytes Relative: 0 %
Monocytes Absolute: 1.3 10*3/uL — ABNORMAL HIGH (ref 0.2–0.9)
NEUTROS PCT: 62 %
NRBC: 0 /100{WBCs}
Neutro Abs: 6.9 10*3/uL — ABNORMAL HIGH (ref 1.4–6.5)
Other: 0 %
PLATELETS: 294 10*3/uL (ref 150–440)
Promyelocytes Absolute: 0 %
RBC: 3.96 MIL/uL (ref 3.80–5.20)
RDW: 14.8 % — ABNORMAL HIGH (ref 11.5–14.5)
WBC: 11.1 10*3/uL — AB (ref 3.6–11.0)

## 2017-12-03 LAB — LACTIC ACID, PLASMA: Lactic Acid, Venous: 1.3 mmol/L (ref 0.5–1.9)

## 2017-12-03 LAB — LIPASE, BLOOD: LIPASE: 23 U/L (ref 11–51)

## 2017-12-03 LAB — TROPONIN I

## 2017-12-03 MED ORDER — ATENOLOL 25 MG PO TABS
25.0000 mg | ORAL_TABLET | Freq: Every day | ORAL | Status: DC
  Administered 2017-12-03 – 2017-12-06 (×4): 25 mg via ORAL
  Filled 2017-12-03 (×4): qty 1

## 2017-12-03 MED ORDER — ENSURE ENLIVE PO LIQD
237.0000 mL | Freq: Three times a day (TID) | ORAL | Status: DC
  Administered 2017-12-03 – 2017-12-06 (×7): 237 mL via ORAL

## 2017-12-03 MED ORDER — ADULT MULTIVITAMIN W/MINERALS CH
1.0000 | ORAL_TABLET | Freq: Every day | ORAL | Status: DC
  Administered 2017-12-03 – 2017-12-06 (×4): 1 via ORAL
  Filled 2017-12-03 (×4): qty 1

## 2017-12-03 MED ORDER — ONDANSETRON HCL 4 MG/2ML IJ SOLN
4.0000 mg | Freq: Four times a day (QID) | INTRAMUSCULAR | Status: DC | PRN
  Administered 2017-12-03 – 2017-12-04 (×3): 4 mg via INTRAVENOUS
  Filled 2017-12-03 (×3): qty 2

## 2017-12-03 MED ORDER — ONDANSETRON HCL 4 MG PO TABS: 4.0000 mg | ORAL_TABLET | Freq: Four times a day (QID) | ORAL | Status: DC | PRN

## 2017-12-03 MED ORDER — PANTOPRAZOLE SODIUM 40 MG PO TBEC
40.0000 mg | DELAYED_RELEASE_TABLET | Freq: Every day | ORAL | Status: DC
  Administered 2017-12-03: 40 mg via ORAL
  Filled 2017-12-03: qty 1

## 2017-12-03 MED ORDER — GABAPENTIN 300 MG PO CAPS
300.0000 mg | ORAL_CAPSULE | Freq: Three times a day (TID) | ORAL | Status: DC
  Administered 2017-12-03 – 2017-12-06 (×9): 300 mg via ORAL
  Filled 2017-12-03 (×9): qty 1

## 2017-12-03 MED ORDER — HEPARIN SODIUM (PORCINE) 5000 UNIT/ML IJ SOLN
5000.0000 [IU] | Freq: Three times a day (TID) | INTRAMUSCULAR | Status: DC
  Administered 2017-12-03 – 2017-12-05 (×5): 5000 [IU] via SUBCUTANEOUS
  Filled 2017-12-03 (×5): qty 1

## 2017-12-03 MED ORDER — AMLODIPINE BESYLATE 5 MG PO TABS
5.0000 mg | ORAL_TABLET | Freq: Every day | ORAL | Status: DC
  Administered 2017-12-03 – 2017-12-06 (×4): 5 mg via ORAL
  Filled 2017-12-03 (×4): qty 1

## 2017-12-03 MED ORDER — HYDRALAZINE HCL 20 MG/ML IJ SOLN: 10.0000 mg | INTRAMUSCULAR | Status: DC | PRN

## 2017-12-03 MED ORDER — SODIUM CHLORIDE 0.9 % IV BOLUS (SEPSIS)
500.0000 mL | Freq: Once | INTRAVENOUS | Status: AC
  Administered 2017-12-03: 500 mL via INTRAVENOUS

## 2017-12-03 MED ORDER — ACETAMINOPHEN 650 MG RE SUPP: 650.0000 mg | Freq: Four times a day (QID) | RECTAL | Status: DC | PRN

## 2017-12-03 MED ORDER — DOCUSATE SODIUM 100 MG PO CAPS
100.0000 mg | ORAL_CAPSULE | Freq: Two times a day (BID) | ORAL | Status: DC
  Administered 2017-12-03 – 2017-12-06 (×6): 100 mg via ORAL
  Filled 2017-12-03 (×6): qty 1

## 2017-12-03 MED ORDER — BISACODYL 10 MG RE SUPP: 10.0000 mg | Freq: Every day | RECTAL | Status: DC | PRN

## 2017-12-03 MED ORDER — LOSARTAN POTASSIUM 50 MG PO TABS
100.0000 mg | ORAL_TABLET | Freq: Every day | ORAL | Status: DC
  Administered 2017-12-03 – 2017-12-06 (×4): 100 mg via ORAL
  Filled 2017-12-03 (×4): qty 2

## 2017-12-03 MED ORDER — VITAMIN D 1000 UNITS PO TABS
1000.0000 [IU] | ORAL_TABLET | Freq: Every day | ORAL | Status: DC
  Administered 2017-12-03 – 2017-12-06 (×4): 1000 [IU] via ORAL
  Filled 2017-12-03 (×4): qty 1

## 2017-12-03 MED ORDER — HYDROCHLOROTHIAZIDE 25 MG PO TABS
25.0000 mg | ORAL_TABLET | Freq: Every day | ORAL | Status: DC
  Administered 2017-12-03 – 2017-12-06 (×4): 25 mg via ORAL
  Filled 2017-12-03 (×4): qty 1

## 2017-12-03 MED ORDER — POTASSIUM CHLORIDE IN NACL 20-0.9 MEQ/L-% IV SOLN
INTRAVENOUS | Status: DC
  Administered 2017-12-03 – 2017-12-05 (×4): via INTRAVENOUS
  Filled 2017-12-03 (×6): qty 1000

## 2017-12-03 MED ORDER — OXYBUTYNIN CHLORIDE 5 MG PO TABS
5.0000 mg | ORAL_TABLET | Freq: Two times a day (BID) | ORAL | Status: DC
  Administered 2017-12-03 – 2017-12-06 (×6): 5 mg via ORAL
  Filled 2017-12-03 (×6): qty 1

## 2017-12-03 MED ORDER — ACETAMINOPHEN 325 MG PO TABS: 650.0000 mg | ORAL_TABLET | Freq: Four times a day (QID) | ORAL | Status: DC | PRN

## 2017-12-03 MED ORDER — FERROUS SULFATE 325 (65 FE) MG PO TABS
325.0000 mg | ORAL_TABLET | Freq: Two times a day (BID) | ORAL | Status: DC
  Administered 2017-12-03 – 2017-12-06 (×7): 325 mg via ORAL
  Filled 2017-12-03 (×7): qty 1

## 2017-12-03 MED ORDER — CEFTRIAXONE SODIUM 1 G IJ SOLR
1.0000 g | Freq: Once | INTRAMUSCULAR | Status: AC
  Administered 2017-12-03: 1 g via INTRAVENOUS
  Filled 2017-12-03: qty 10

## 2017-12-03 MED ORDER — PANTOPRAZOLE SODIUM 40 MG IV SOLR
40.0000 mg | Freq: Two times a day (BID) | INTRAVENOUS | Status: DC
  Administered 2017-12-03 – 2017-12-06 (×6): 40 mg via INTRAVENOUS
  Filled 2017-12-03 (×7): qty 40

## 2017-12-03 MED ORDER — SODIUM CHLORIDE 0.9 % IV SOLN
1.0000 g | INTRAVENOUS | Status: AC
  Administered 2017-12-04 – 2017-12-05 (×2): 1 g via INTRAVENOUS
  Filled 2017-12-03 (×2): qty 10

## 2017-12-03 NOTE — Progress Notes (Signed)
Pharmacy Antibiotic Note  [name] is a 84 y.o. female admitted on 12/03/2017 with UTI.  Pharmacy has been consulted for ceftriaxone dosing.  Plan: Ceftriaxone 1g every 24 hours   Weight: 125 lb (56.7 kg)  Temp (24hrs), Avg:98.4 F (36.9 C), Min:98.4 F (36.9 C), Max:98.4 F (36.9 C)  Recent Labs  Lab 12/03/17 1123 12/03/17 1202  WBC  --  11.1*  CREATININE  --  1.05*  LATICACIDVEN 1.3  --     Estimated Creatinine Clearance: 33 mL/min (A) (by C-G formula based on SCr of 1.05 mg/dL (H)).    No Known Allergies  Antimicrobials this admission: 2/16 ceftriaxone >>    Dose adjustments this admission:   Microbiology results: 2/15 UCx: sent  Thank you for allowing pharmacy to be a part of this patient's care.  Candelaria Stagers, PharmD Pharmacy Resident  12/03/2017 2:54 PM

## 2017-12-03 NOTE — ED Provider Notes (Signed)
[facility] Emergency Department Provider Note   ____________________________________________   First MD Initiated Contact with Patient 12/03/17 1121     (approximate)  I have reviewed the triage vital signs and the nursing notes.   HISTORY  Chief Complaint Weakness; Emesis; and Nausea  EM caveat: The patient is confused, limits ability to provide clear history  HPI [name] is a 84 y.o. female past medical history of chronic kidney disease, bilateral heel ulcers, previous acute kidney injury, Proteus UTI, indwelling Foley catheter  EMS called patient's residence, reportedly she has been had increased fatigue, vomited once a couple of days ago, and increased confusion and weakness with poor oral intake for about 2-3 days.  EMS found her in bed at home with family present.  Patient reports no pain or discomfort.  She really cannot describe well what happened but does describe feeling ill for 2-3 days without being able to get better.  Denies chest pain.  Denies headache.  Denies new weakness.  No fevers or chills.  Unsure if she vomited.   Past Medical History:  Diagnosis Date  . Anemia   . Chronic kidney disease, stage 3 ([place])   . Degenerative joint disease (DJD) of lumbar spine   . Diastolic dysfunction   . Dizziness   . Edema   . ETD (eustachian tube dysfunction)   . Heartburn   . History of rectal bleeding   . HTN (hypertension)   . Hypercalcemia   . Intracranial aneurysm   . Low vision, one eye   . Nephritis and nephropathy   . Osteoporosis   . Pyuria   . Urge incontinence   . Urinary frequency   . Urinary retention     Patient Active Problem List   Diagnosis Date Noted  . Pressure injury of skin 03/30/2017  . Sepsis ([place]) 03/28/2017  . Pressure ulcer 06/09/2016  . Syncope 06/08/2016  . Urinary tract infection 06/08/2016  . Periprosthetic fracture around internal prosthetic left knee joint 05/25/2016  . Fracture of femur,  distal, left, closed ([place]) 05/25/2016  . Closed displaced fracture of base of fourth metacarpal bone of right hand 05/25/2016  . Closed displaced fracture of base of fifth metacarpal bone of right hand 05/25/2016  . Traumatic perinephric hematoma 05/09/2016  . SAH (subarachnoid hemorrhage) ([place]) 05/08/2016  . Trauma 05/08/2016  . SDH (subdural hematoma) ([place]) 05/08/2016  . Perinephric hematoma 05/08/2016  . Hypertension 05/08/2016  . Fractures involving multiple body regions 05/08/2016  . CKD (chronic kidney disease) 05/08/2016  . Brain edema ([place]) 05/08/2016  . Acute blood loss anemia 05/08/2016    Past Surgical History:  Procedure Laterality Date  . dilation of esophageal web    . FRACTURE SURGERY Left    femur  . IR GENERIC HISTORICAL  08/05/2016   IR CATHETER TUBE CHANGE 08/05/2016 [name], MD [facility]  . IR GENERIC HISTORICAL  09/17/2016   IR CATHETER TUBE CHANGE 09/17/2016 [facility]  . IR GENERIC HISTORICAL  11/19/2016   IR CATHETER TUBE CHANGE 11/19/2016 [name], MD [facility]  . IR GENERIC HISTORICAL  11/29/2016   IR CATHETER TUBE CHANGE 11/29/2016 [name], DO [facility]  . JOINT REPLACEMENT Bilateral   . TOTAL KNEE ARTHROPLASTY Bilateral   . VAGINAL HYSTERECTOMY  1967   ovaries also removed because of fibroida    Prior to Admission medications   Medication Sig Start Date End Date Taking? Authorizing Provider  acetaminophen (TYLENOL) 325 MG tablet Take  650 mg by mouth 2 (two) times daily as needed.   Yes [provider]  amLODipine (NORVASC) 10 MG tablet TAKE ONE TABLET BY MOUTH EVERY DAY HIGH BLOOD PRESSURE 12/13/16  Yes [provider]  atenolol (TENORMIN) 25 MG tablet Take 1 tablet (25 mg total) by mouth daily. 06/10/16  Yes Mody, Sital, MD  CALCIUM CITRATE+D3 PETITES 200-250 MG-UNIT TABS Take 1 tablet by mouth daily. 11/08/17  Yes [provider]  cholecalciferol (VITAMIN D) 1000 units tablet Take 1,000 Units by  mouth daily. 11/08/17  Yes [provider]  feeding supplement, ENSURE ENLIVE, (ENSURE ENLIVE) LIQD Take 237 mLs by mouth 3 (three) times daily between meals. 03/30/17  Yes Mody, Sital, MD  FEROSUL 325 (65 Fe) MG tablet Take 325 mg by mouth 2 (two) times daily. 11/08/17  Yes [provider]  furosemide (LASIX) 20 MG tablet Take 20 mg by mouth daily. 11/08/17  Yes [provider]  gabapentin (NEURONTIN) 300 MG capsule Take 300 mg by mouth 3 (three) times daily. 11/08/17  Yes [provider]  hydrochlorothiazide (HYDRODIURIL) 25 MG tablet Take 25 mg by mouth daily. 11/03/17  Yes [provider]  hydrOXYzine (ATARAX/VISTARIL) 10 MG tablet Take 10 mg by mouth 3 (three) times daily as needed. 11/08/17  Yes [provider]  losartan (COZAAR) 100 MG tablet Take 1 tablet (100 mg total) by mouth daily. 06/10/16  Yes Bettey Costa, MD  Multiple Vitamin (MULTIVITAMIN) tablet Take 1 tablet by mouth daily. 06/10/16  Yes Mody, Ulice Bold, MD  nystatin (MYCOSTATIN) 100000 UNIT/ML suspension Take 5 mLs (500,000 Units total) by mouth 4 (four) times daily. 03/30/17  Yes Mody, Ulice Bold, MD  oxybutynin (DITROPAN) 5 MG tablet Take 5 mg by mouth 2 (two) times daily. 12/04/16  Yes [provider]  mirabegron ER (MYRBETRIQ) 25 MG TB24 tablet Take 1 tablet (25 mg total) by mouth daily. Patient not taking: Reported on 07/14/2017 02/16/17   Hollice Espy, MD  pantoprazole (PROTONIX) 40 MG tablet Take 1 tablet (40 mg total) by mouth daily. Patient not taking: Reported on 12/03/2017 03/31/17 07/14/17  Bettey Costa, MD    Allergies Patient has no known allergies.  Family History  Problem Relation Age of Onset  . Kidney disease Neg Hx   . Bladder Cancer Neg Hx   . Kidney cancer Neg Hx     Social History Social History   Tobacco Use  . Smoking status: Former Smoker    Last attempt to quit: 07/23/1964    Years since quitting: 53.4  . Smokeless tobacco: Never Used  . Tobacco  comment: quit 1965  Substance Use Topics  . Alcohol use: No    Alcohol/week: 0.0 oz  . Drug use: No    Review of Systems  EM caveat: Limited due to patient confusion   ____________________________________________   PHYSICAL EXAM:  VITAL SIGNS: ED Triage Vitals  Enc Vitals Group     BP 12/03/17 1113 (!) 154/76     Pulse Rate 12/03/17 1113 86     Resp 12/03/17 1113 18     Temp 12/03/17 1113 98.4 F (36.9 C)     Temp Source 12/03/17 1113 Oral     SpO2 12/03/17 1113 98 %     Weight 12/03/17 1114 125 lb (56.7 kg)     Height --      Head Circumference --      Peak Flow --      Pain Score --  Pain Loc --      Pain Edu? --      Excl. in [place]? --     Constitutional: Alert and disoriented except to self.  She does not recognize location, states she thinks she is at "[place]".  Unable to recall the hospitalist who she is and or the situation that prompted her to come to the hospital today. Eyes: Conjunctivae are normal. Head: Atraumatic. Nose: No congestion/rhinnorhea. Mouth/Throat: Mucous membranes are slightly dry. Neck: No stridor.  Dizziness Cardiovascular: Normal rate, regular rhythm. Grossly normal heart sounds.  Good peripheral circulation. Respiratory: Normal respiratory effort.  No retractions. Lungs CTAB. Gastrointestinal: Soft and nontender. No distention. Indwelling Foley catheter, somewhat oranges colored urine with a slight sediment noted within it. Musculoskeletal: No lower extremity tenderness nor edema.  She is able to move her upper extremities well with good strength.  The lower extremities demonstrate decreased strength about 3 out of 5 bilaterally, her heels are floated and dressings removed she has 2 slight pressure ulcers that appear to be well-healing over the feet bilaterally without purulence, erythema, or crepitus. Neurologic:  Normal speech and language. No gross focal neurologic deficits are appreciated.  Skin:  Skin is warm, dry and intact. No  rash noted. Psychiatric: Mood and affect are normal. Speech and behavior are normal.  ____________________________________________   LABS (all labs ordered are listed, but only abnormal results are displayed)  Labs Reviewed  URINALYSIS, ROUTINE W REFLEX MICROSCOPIC - Abnormal; Notable for the following components:      Result Value   Color, Urine YELLOW (*)    APPearance TURBID (*)    Hgb urine dipstick MODERATE (*)    Ketones, ur 20 (*)    Protein, ur 100 (*)    Nitrite POSITIVE (*)    Leukocytes, UA LARGE (*)    All other components within normal limits  CBC WITH DIFFERENTIAL/PLATELET - Abnormal; Notable for the following components:   WBC 11.1 (*)    Hemoglobin 11.7 (*)    RDW 14.8 (*)    Neutro Abs 6.9 (*)    Monocytes Absolute 1.3 (*)    All other components within normal limits  COMPREHENSIVE METABOLIC PANEL - Abnormal; Notable for the following components:   Sodium 130 (*)    Potassium 3.4 (*)    Chloride 91 (*)    BUN 27 (*)    Creatinine, Ser 1.05 (*)    Total Protein 8.7 (*)    GFR calc non Af Amer 47 (*)    GFR calc Af Amer 55 (*)    Anion gap 16 (*)    All other components within normal limits  URINE CULTURE  LACTIC ACID, PLASMA  LIPASE, BLOOD  TROPONIN I  CBC WITH DIFFERENTIAL/PLATELET   ____________________________________________  EKG  Reviewed and interpreted by me at 11:20 AM Heart rate 90 500 Normal sinus rhythm, right bundle branch block.  No associated ischemic changes are noted ____________________________________________  RADIOLOGY    Chest x-ray reviewed, negative for acute disease ____________________________________________   PROCEDURES  Procedure(s) performed: None  Procedures  Critical Care performed: No  ____________________________________________   INITIAL IMPRESSION / ASSESSMENT AND PLAN / ED COURSE  Pertinent labs & imaging results that were available during my care of the patient were reviewed by me and  considered in my medical decision making (see chart for details).  Patient presents for evaluation of confusion.  On exam she is indeed confused, seems to have a lot of trouble recalling things, sounds to  be somewhat different than typical presentation of dementia though, her mental status was somnolent at home and EMS gave IV fluids with reported improvement somewhat, she appears to have some signs of a possible delirium.  He has an indwelling Foley catheter.  Reports vomiting once 2 days ago.  Reassuring abdominal exam.  No cardiac or pulmonary complaints.  No focal neurologic abnormalities.  Highly suspect a possible urinary source.  We will check metabolic panel chest x-ray etc. broad workup for further evaluation of cause of delirium today.  ----------------------------------------- 2:13 PM on 12/03/2017 -----------------------------------------  Appears probable urinary tract infection, based on previous cultures will order Rocephin.  Patient is improving, but still disoriented, believe she is still in [place].  Family reports altered mental status abnormal, lives at home.  Discussed with Dr. Doy Hutching, will admit for further workup and care     ____________________________________________   FINAL CLINICAL IMPRESSION(S) / ED DIAGNOSES  Final diagnoses:  Lower urinary tract infection, acute      NEW MEDICATIONS STARTED DURING THIS VISIT:  New Prescriptions   No medications on file     Note:  This document was prepared using Dragon voice recognition software and may include unintentional dictation errors.     Delman Kitten, MD 12/03/17 [phone]

## 2017-12-03 NOTE — ED Notes (Signed)
Pt in NAD at this time. Pt has visitor at bedside. Pt has been resting with eyes closed.

## 2017-12-03 NOTE — H&P (Signed)
History and Physical    [name] GGY:[id number] DOB: April 03, 1931 DOA: 12/03/2017  Referring physician: Dr. Jacqualine Code PCP: [name], MD  Specialists: none  Chief Complaint: confusion  HPI: [name] is a 84 y.o. female has a past medical history significant for HTN, CKD, anemia, and CVA now with worsening confusion. Lives alone. Brought by family. Pt thinks she is here for problems swallowing. She c/o dysphagia and choking. Family states she is confused. Not eating. Some vomiting and ? Diarrhea. In Er, pt noted to have UTI with hyponatremia and hypokalemia. She is now admitted.  Review of Systems: The patient denies anorexia, fever, weight loss,, vision loss, decreased hearing, hoarseness, chest pain, syncope, dyspnea on exertion, peripheral edema, balance deficits, hemoptysis, abdominal pain, melena, hematochezia, severe indigestion/heartburn, hematuria, incontinence, genital sores, muscle weakness, suspicious skin lesions, transient blindness, difficulty walking, depression, unusual weight change, abnormal bleeding, enlarged lymph nodes, angioedema, and breast masses.   Past Medical History:  Diagnosis Date  . Anemia   . Chronic kidney disease, stage 3 ([place])   . Degenerative joint disease (DJD) of lumbar spine   . Diastolic dysfunction   . Dizziness   . Edema   . ETD (eustachian tube dysfunction)   . Heartburn   . History of rectal bleeding   . HTN (hypertension)   . Hypercalcemia   . Intracranial aneurysm   . Low vision, one eye   . Nephritis and nephropathy   . Osteoporosis   . Pyuria   . Urge incontinence   . Urinary frequency   . Urinary retention    Past Surgical History:  Procedure Laterality Date  . dilation of esophageal web    . FRACTURE SURGERY Left    femur  . IR GENERIC HISTORICAL  08/05/2016   IR CATHETER TUBE CHANGE 08/05/2016 [name], MD [facility]  . IR GENERIC HISTORICAL  09/17/2016   IR CATHETER TUBE CHANGE 09/17/2016 [facility]  . IR GENERIC HISTORICAL  11/19/2016   IR CATHETER TUBE CHANGE 11/19/2016 [name], MD [facility]  . IR GENERIC HISTORICAL  11/29/2016   IR CATHETER TUBE CHANGE 11/29/2016 [name], DO [facility]  . JOINT REPLACEMENT Bilateral   . TOTAL KNEE ARTHROPLASTY Bilateral   . VAGINAL HYSTERECTOMY  1967   ovaries also removed because of fibroida   Social History:  reports that she quit smoking about 53 years ago. she has never used smokeless tobacco. She reports that she does not drink alcohol or use drugs.  No Known Allergies  Family History  Problem Relation Age of Onset  . Kidney disease Neg Hx   . Bladder Cancer Neg Hx   . Kidney cancer Neg Hx     Prior to Admission medications   Medication Sig Start Date End Date Taking? Authorizing Provider  acetaminophen (TYLENOL) 325 MG tablet Take 650 mg by mouth 2 (two) times daily as needed.   Yes [provider]  amLODipine (NORVASC) 10 MG tablet TAKE ONE TABLET BY MOUTH EVERY DAY HIGH BLOOD PRESSURE 12/13/16  Yes [provider]  atenolol (TENORMIN) 25 MG tablet Take 1 tablet (25 mg total) by mouth daily. 06/10/16  Yes Mody, Sital, MD  CALCIUM CITRATE+D3 PETITES 200-250 MG-UNIT TABS Take 1 tablet by mouth daily. 11/08/17  Yes [provider]  cholecalciferol (VITAMIN D) 1000 units tablet Take 1,000 Units by mouth daily. 11/08/17  Yes [provider]  feeding supplement, ENSURE ENLIVE, (ENSURE ENLIVE) LIQD Take 237 mLs by mouth  3 (three) times daily between meals. 03/30/17  Yes Mody, Sital, MD  FEROSUL 325 (65 Fe) MG tablet Take 325 mg by mouth 2 (two) times daily. 11/08/17  Yes [provider]  furosemide (LASIX) 20 MG tablet Take 20 mg by mouth daily. 11/08/17  Yes [provider]  gabapentin (NEURONTIN) 300 MG capsule Take 300 mg by mouth 3 (three) times daily. 11/08/17  Yes [provider]  hydrochlorothiazide (HYDRODIURIL) 25 MG tablet Take 25 mg by mouth daily. 11/03/17  Yes  [provider]  hydrOXYzine (ATARAX/VISTARIL) 10 MG tablet Take 10 mg by mouth 3 (three) times daily as needed. 11/08/17  Yes [provider]  losartan (COZAAR) 100 MG tablet Take 1 tablet (100 mg total) by mouth daily. 06/10/16  Yes Bettey Costa, MD  Multiple Vitamin (MULTIVITAMIN) tablet Take 1 tablet by mouth daily. 06/10/16  Yes Mody, Ulice Bold, MD  nystatin (MYCOSTATIN) 100000 UNIT/ML suspension Take 5 mLs (500,000 Units total) by mouth 4 (four) times daily. 03/30/17  Yes Mody, Ulice Bold, MD  oxybutynin (DITROPAN) 5 MG tablet Take 5 mg by mouth 2 (two) times daily. 12/04/16  Yes [provider]  mirabegron ER (MYRBETRIQ) 25 MG TB24 tablet Take 1 tablet (25 mg total) by mouth daily. Patient not taking: Reported on 07/14/2017 02/16/17   Hollice Espy, MD  pantoprazole (PROTONIX) 40 MG tablet Take 1 tablet (40 mg total) by mouth daily. Patient not taking: Reported on 12/03/2017 03/31/17 07/14/17  Bettey Costa, MD   Physical Exam: Vitals:   12/03/17 1300 12/03/17 1330 12/03/17 1400 12/03/17 1415  BP: (!) 153/78 (!) 150/74 137/79   Pulse: 90   (!) 102  Resp:      Temp:      TempSrc:      SpO2: 99%   100%  Weight:         General:  No apparent distress, WDWN, [place]/AT  Eyes: PERRL, EOMI, no scleral icterus, conjunctiva clear  ENT: moist oropharynx without exudate, TM's benign, edentulous  Neck: supple, no lymphadenopathy. No bruits or thyromegaly  Cardiovascular: regular rate without MRG; 2+ peripheral pulses, no JVD, no peripheral edema  Respiratory: CTA biL, good air movement without wheezing, rhonchi or crackled. Respiratory effort normal  Abdomen: soft, non tender to palpation, positive bowel sounds, no guarding, no rebound  Skin: no rashes or lesions  Musculoskeletal: normal bulk and tone, no joint swelling  Psychiatric: normal mood and affect, alert, oriented to person only  Neurologic: CN 2-12 grossly intact, Motor strength 5/5 in all 4 groups with symmetric  DTR's and non-focal sensory exam  Labs on Admission:  Basic Metabolic Panel: Recent Labs  Lab 12/03/17 1202  NA 130*  K 3.4*  CL 91*  CO2 23  GLUCOSE 98  BUN 27*  CREATININE 1.05*  CALCIUM 9.8   Liver Function Tests: Recent Labs  Lab 12/03/17 1202  AST 27  ALT 16  ALKPHOS 93  BILITOT 1.1  PROT 8.7*  ALBUMIN 3.6   Recent Labs  Lab 12/03/17 1202  LIPASE 23   No results for input(s): AMMONIA in the last 168 hours. CBC: Recent Labs  Lab 12/03/17 1202  WBC 11.1*  NEUTROABS 6.9*  HGB 11.7*  HCT 35.6  MCV 89.8  PLT 294   Cardiac Enzymes: Recent Labs  Lab 12/03/17 1202  TROPONINI <0.03    BNP (last 3 results) No results for input(s): BNP in the last 8760 hours.  ProBNP (last 3 results) No results for input(s): PROBNP in the last  8760 hours.  CBG: No results for input(s): GLUCAP in the last 168 hours.  Radiological Exams on Admission: Dg Chest 2 View  Result Date: 12/03/2017 CLINICAL DATA:  Weakness and fatigue. EXAM: CHEST  2 VIEW COMPARISON:  Chest x-ray dated March 28, 2017. FINDINGS: The heart size and mediastinal contours are within normal limits. Normal pulmonary vascularity. Atherosclerotic calcification of the aortic arch. No focal consolidation, pleural effusion, or pneumothorax. No acute osseous abnormality. Old left-sided rib fractures. IMPRESSION: No active cardiopulmonary disease. Electronically Signed   By: Titus Dubin M.D.   On: 12/03/2017 11:59    EKG: Independently reviewed.  Assessment/Plan Principal Problem:   Acute encephalopathy Active Problems:   Urinary tract infection   Hypertension   Dysphagia   Will admit to floor with IV fluids and IV ABX. Consult PT, ST, and CSW. Repeat labs in AM  Diet: soft Fluids: NS with K+@75  DVT Prophylaxis: SQ Heparin  Code Status: FULL  Family Communication: yes  Disposition Plan: TBD  Time spent: 50 min

## 2017-12-03 NOTE — Evaluation (Signed)
Clinical/Bedside Swallow Evaluation Patient Details  Name: [name] MRN: [id number] Date of Birth: 1931-07-30  Today's Date: 12/03/2017 Time: SLP Start Time (ACUTE ONLY): 79 SLP Stop Time (ACUTE ONLY): 1645 SLP Time Calculation (min) (ACUTE ONLY): 22 min  Past Medical History:  Past Medical History:  Diagnosis Date  . Anemia   . Chronic kidney disease, stage 3 ([place])   . Degenerative joint disease (DJD) of lumbar spine   . Diastolic dysfunction   . Dizziness   . Edema   . ETD (eustachian tube dysfunction)   . Heartburn   . History of rectal bleeding   . HTN (hypertension)   . Hypercalcemia   . Intracranial aneurysm   . Low vision, one eye   . Nephritis and nephropathy   . Osteoporosis   . Pyuria   . Urge incontinence   . Urinary frequency   . Urinary retention    Past Surgical History:  Past Surgical History:  Procedure Laterality Date  . dilation of esophageal web    . FRACTURE SURGERY Left    femur  . IR GENERIC HISTORICAL  08/05/2016   IR CATHETER TUBE CHANGE 08/05/2016 [name], MD [facility]  . IR GENERIC HISTORICAL  09/17/2016   IR CATHETER TUBE CHANGE 09/17/2016 [facility]  . IR GENERIC HISTORICAL  11/19/2016   IR CATHETER TUBE CHANGE 11/19/2016 [name], MD [facility]  . IR GENERIC HISTORICAL  11/29/2016   IR CATHETER TUBE CHANGE 11/29/2016 [name], DO [facility]  . JOINT REPLACEMENT Bilateral   . TOTAL KNEE ARTHROPLASTY Bilateral   . VAGINAL HYSTERECTOMY  1967   ovaries also removed because of fibroida   HPI:      Assessment / Plan / Recommendation Clinical Impression  pt presents with a mild pharyngeoesophageal dysphagia characterized by pt being edentulous and belching post intake. pt stated she becomes nauseated upon intake of solids and liquids. pt intake poor and post x 3 bites discontinued tdue to nausea. pt wiht significant belching however no overt ssx aspiration no watery eyes or wet vocal qualiity noted. family  educated on dys 2 with thin via cup or straw and to discuss with MD esophageal symptoms. RN educated to crush meds in applesauce. SLP Visit Diagnosis: Dysphagia, pharyngoesophageal phase (R13.14)    Aspiration Risk  Mild aspiration risk    Diet Recommendation Dysphagia 2 (Fine chop);Thin liquid   Liquid Administration via: Cup;Straw Medication Administration: Crushed with puree Supervision: Patient able to self feed Compensations: Slow rate;Small sips/bites;Follow solids with liquid Postural Changes: Seated upright at 90 degrees    Other  Recommendations Recommended Consults: Consider GI evaluation Oral Care Recommendations: Oral care BID   Follow up Recommendations        Frequency and Duration min 3x week  2 weeks       Prognosis Prognosis for Safe Diet Advancement: Good Barriers to Reach Goals: Cognitive deficits      Swallow Study   General Date of Onset: 12/03/17 Type of Study: Bedside Swallow Evaluation Diet Prior to this Study: Dysphagia 3 (soft);Thin liquids Temperature Spikes Noted: N/A Respiratory Status: Nasal cannula History of Recent Intubation: No Behavior/Cognition: Alert;Cooperative;Pleasant mood;Confused Oral Cavity Assessment: Dry Oral Care Completed by SLP: No Oral Cavity - Dentition: Edentulous Vision: Functional for self-feeding Self-Feeding Abilities: Able to feed self Patient Positioning: Upright in bed Baseline Vocal Quality: Normal Volitional Cough: Strong Volitional Swallow: Able to elicit    Oral/Motor/Sensory Function Overall Oral Motor/Sensory Function: Within functional limits   Ice  Chips Ice chips: Within functional limits Presentation: Spoon   Thin Liquid Thin Liquid: Within functional limits Presentation: Cup;Self Fed;Straw    Nectar Thick Nectar Thick Liquid: Not tested   Honey Thick Honey Thick Liquid: Not tested   Puree Puree: Within functional limits Presentation: Self Fed;Spoon   Solid   GO   Solid: Within functional  limits Presentation: Self Fed;Spoon Other Comments: Dys 2 trials due to teeth, pt had difficulty with regular textures        [name] 12/03/2017,5:04 PM

## 2017-12-03 NOTE — ED Triage Notes (Signed)
Pt to ED from home via EMS c/o generalized weakness, not acting herself per family, n/v x2 days.  EMS states strong urinary smell.  VSS per EMS, given normal saline bolus en route, foley catheter in place from home.  CBG 110.

## 2017-12-03 NOTE — Clinical Social Work Note (Signed)
CSW received consult for SNF placement. CSW will follow pending PT recommendations.  Santiago Bumpers, MSW, Latanya Presser [phone]

## 2017-12-03 NOTE — ED Notes (Signed)
Pt has returned from xray
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
## 2017-12-03 NOTE — Plan of Care (Signed)
  Progressing Education: Knowledge of General Education information will improve 12/03/2017 1710 - Progressing by Cherylann Parr, RN Safety: Ability to remain free from injury will improve 12/03/2017 1710 - Progressing by Cherylann Parr, RN Urinary Elimination: Signs and symptoms of infection will decrease 12/03/2017 1710 - Progressing by Cherylann Parr, RN

## 2017-12-04 LAB — COMPREHENSIVE METABOLIC PANEL
ALBUMIN: 3.2 g/dL — AB (ref 3.5–5.0)
ALK PHOS: 76 U/L (ref 38–126)
ALT: 15 U/L (ref 14–54)
AST: 22 U/L (ref 15–41)
Anion gap: 10 (ref 5–15)
BILIRUBIN TOTAL: 0.8 mg/dL (ref 0.3–1.2)
BUN: 24 mg/dL — AB (ref 6–20)
CALCIUM: 9.2 mg/dL (ref 8.9–10.3)
CO2: 24 mmol/L (ref 22–32)
CREATININE: 0.87 mg/dL (ref 0.44–1.00)
Chloride: 100 mmol/L — ABNORMAL LOW (ref 101–111)
GFR calc Af Amer: >60 mL/min (ref >60)
GFR calc non Af Amer: 59 mL/min — ABNORMAL LOW (ref >60)
GLUCOSE: 102 mg/dL — AB (ref 65–99)
Potassium: 3.4 mmol/L — ABNORMAL LOW (ref 3.5–5.1)
SODIUM: 134 mmol/L — AB (ref 135–145)
Total Protein: 7.7 g/dL (ref 6.5–8.1)

## 2017-12-04 LAB — CBC
HCT: 34.1 % — ABNORMAL LOW (ref 35.0–47.0)
Hemoglobin: 11.5 g/dL — ABNORMAL LOW (ref 12.0–16.0)
MCH: 30.1 pg (ref 26.0–34.0)
MCHC: 33.6 g/dL (ref 32.0–36.0)
MCV: 89.4 fL (ref 80.0–100.0)
Platelets: 258 10*3/uL (ref 150–440)
RBC: 3.81 MIL/uL (ref 3.80–5.20)
RDW: 14.8 % — AB (ref 11.5–14.5)
WBC: 6.2 10*3/uL (ref 3.6–11.0)

## 2017-12-04 LAB — GLUCOSE, CAPILLARY: GLUCOSE-CAPILLARY: 82 mg/dL (ref 65–99)

## 2017-12-04 NOTE — Evaluation (Signed)
Physical Therapy Evaluation Patient Details Name: [name] MRN: [id number] DOB: 19-Jan-1931 Today's Date: 12/04/2017   History of Present Illness  Patient is an 84 y/o female that presents with vomiting, diarrhea, found to have a UTI and has been noted to be confused.   Clinical Impression  Patient evaluated after admission for vomiting, diarrhea, and found to have a UTI. She is not able to provide much insight into her situation or previous level of activity. No family was present to provide any clarity either. She was noted to have R shift in bed, and had severe inversion of both LEs (L>R). Noted to have heel pads on as well, likely indicating she has been minimally ambulatory recently. Per old notes she was independent with ambulation at some point, but required significant assistance to complete bed mobility, and maintain sitting balance at the edge of the bed. She demonstrates marked generalized weakness and would benefit from short term rehabilitation to increase her strength and mobility to return to previous living establishment.     Follow Up Recommendations SNF    Equipment Recommendations       Recommendations for Other Services       Precautions / Restrictions Precautions Precautions: Fall Restrictions Weight Bearing Restrictions: No      Mobility  Bed Mobility Overal bed mobility: Needs Assistance Bed Mobility: Supine to Sit;Sit to Supine     Supine to sit: Mod assist;Max assist Sit to supine: Mod assist;Max assist   General bed mobility comments: Patient is able to sit at the edge of the bed with mod-max assistance, leans posteriorly and to the R during all parts of the transfer.   Transfers                 General transfer comment: Multiple attempts for sit to stand transfer, patient unable to provide enough force through her LEs to elevate herself off the bed.   Ambulation/Gait                Stairs            Wheelchair Mobility     Modified Rankin (Stroke Patients Only)       Balance Overall balance assessment: Needs assistance Sitting-balance support: No upper extremity supported;Feet supported Sitting balance-Leahy Scale: Poor   Postural control: Right lateral lean;Posterior lean                                   Pertinent Vitals/Pain Pain Assessment: Faces Faces Pain Scale: No hurt    Home Living Family/patient expects to be discharged to:: Private residence Living Arrangements: Other relatives(Niece)                    Prior Function           Comments: Patient is not able to provide any substantial information to history, no family present.      Hand Dominance   Dominant Hand: Right    Extremity/Trunk Assessment   Upper Extremity Assessment Upper Extremity Assessment: Generalized weakness    Lower Extremity Assessment Lower Extremity Assessment: Generalized weakness       Communication      Cognition Arousal/Alertness: Lethargic Behavior During Therapy: Flat affect Overall Cognitive Status: Difficult to assess  General Comments: Minimal conversation, patient does not answer many questions or provide insight into situation.       General Comments General comments (skin integrity, edema, etc.): Dressing covering bilateral heels.     Exercises     Assessment/Plan    PT Assessment Patient needs continued PT services  PT Problem List Decreased strength;Decreased mobility;Decreased safety awareness;Decreased knowledge of precautions;Decreased cognition;Decreased activity tolerance;Decreased balance;Decreased knowledge of use of DME;Cardiopulmonary status limiting activity       PT Treatment Interventions DME instruction;Therapeutic activities;Therapeutic exercise;Gait training;Stair training;Balance training;Neuromuscular re-education;Patient/family education    PT Goals (Current goals can be found in the  Care Plan section)  Acute Rehab PT Goals PT Goal Formulation: Patient unable to participate in goal setting Time For Goal Achievement: 12/18/17 Potential to Achieve Goals: Fair    Frequency Min 2X/week   Barriers to discharge        Co-evaluation               AM-PAC PT "6 Clicks" Daily Activity  Outcome Measure Difficulty turning over in bed (including adjusting bedclothes, sheets and blankets)?: A Lot Difficulty moving from lying on back to sitting on the side of the bed? : A Lot Difficulty sitting down on and standing up from a chair with arms (e.g., wheelchair, bedside commode, etc,.)?: Unable Help needed moving to and from a bed to chair (including a wheelchair)?: Total Help needed walking in hospital room?: Total Help needed climbing 3-5 steps with a railing? : Total 6 Click Score: 8    End of Session Equipment Utilized During Treatment: Gait belt Activity Tolerance: Patient limited by lethargy;Patient limited by fatigue Patient left: with call bell/phone within reach;in bed;with bed alarm set Nurse Communication: Mobility status PT Visit Diagnosis: Muscle weakness (generalized) (M62.81);Difficulty in walking, not elsewhere classified (R26.2)    Time: 6203-5597 PT Time Calculation (min) (ACUTE ONLY): 17 min   Charges:   PT Evaluation $PT Eval Moderate Complexity: 1 Mod     PT G Codes:   PT G-Codes **NOT FOR INPATIENT CLASS** Functional Assessment Tool Used: AM-PAC 6 Clicks Basic Mobility    Royce Macadamia PT, DPT, CSCS    12/04/2017, 3:25 PM

## 2017-12-04 NOTE — Progress Notes (Signed)
Dr Darvin Neighbours made aware that pt continues to have episode of nausea and some vomiting noted after PO intake, MD to assess pt

## 2017-12-04 NOTE — Progress Notes (Signed)
[facility] [place] NAME: Kare Dado    MR#:  [id number]  DATE OF BIRTH:  Nov 13, 1930  SUBJECTIVE:  CHIEF COMPLAINT:   Chief Complaint  Patient presents with  . Weakness  . Emesis  . Nausea   Patient continues to have some regurgitation of food after eating.  REVIEW OF SYSTEMS:    Review of Systems  Constitutional: Positive for malaise/fatigue. Negative for chills and fever.  HENT: Negative for sore throat.   Eyes: Negative for blurred vision, double vision and pain.  Respiratory: Negative for cough, hemoptysis, shortness of breath and wheezing.   Cardiovascular: Negative for chest pain, palpitations, orthopnea and leg swelling.  Gastrointestinal: Negative for abdominal pain, constipation, diarrhea, heartburn, nausea and vomiting.  Genitourinary: Negative for dysuria and hematuria.  Musculoskeletal: Negative for back pain and joint pain.  Skin: Negative for rash.  Neurological: Positive for weakness. Negative for sensory change, speech change, focal weakness and headaches.  Endo/Heme/Allergies: Does not bruise/bleed easily.  Psychiatric/Behavioral: Negative for depression. The patient is not nervous/anxious.     DRUG ALLERGIES:  No Known Allergies  VITALS:  Blood pressure 135/68, pulse 84, temperature 97.8 F (36.6 C), temperature source Oral, resp. rate 18, height 5\' 3"  (1.6 m), weight 60.4 kg (133 lb 2.5 oz), SpO2 100 %.  PHYSICAL EXAMINATION:   Physical Exam  GENERAL:  84 y.o.-year-old patient lying in the bed with no acute distress.  EYES: Pupils equal, round, reactive to light and accommodation. No scleral icterus. Extraocular muscles intact.  HEENT: Head atraumatic, normocephalic. Oropharynx and nasopharynx clear.  NECK:  Supple, no jugular venous distention. No thyroid enlargement, no tenderness.  LUNGS: Normal breath sounds bilaterally, no wheezing, rales, rhonchi. No use of accessory muscles of respiration.   CARDIOVASCULAR: S1, S2 normal. No murmurs, rubs, or gallops.  ABDOMEN: Soft, nontender, nondistended. Bowel sounds present. No organomegaly or mass.  EXTREMITIES: No cyanosis, clubbing or edema b/l.    NEUROLOGIC: Moves all 4 extremiites PSYCHIATRIC: The patient is alert and awake SKIN: No obvious rash, lesion, or ulcer.   LABORATORY PANEL:   CBC Recent Labs  Lab 12/04/17 0444  WBC 6.2  HGB 11.5*  HCT 34.1*  PLT 258   ------------------------------------------------------------------------------------------------------------------ Chemistries  Recent Labs  Lab 12/04/17 0444  NA 134*  K 3.4*  CL 100*  CO2 24  GLUCOSE 102*  BUN 24*  CREATININE 0.87  CALCIUM 9.2  AST 22  ALT 15  ALKPHOS 76  BILITOT 0.8   ------------------------------------------------------------------------------------------------------------------  Cardiac Enzymes Recent Labs  Lab 12/03/17 1202  TROPONINI <0.03   ------------------------------------------------------------------------------------------------------------------  RADIOLOGY:  Dg Chest 2 View  Result Date: 12/03/2017 CLINICAL DATA:  Weakness and fatigue. EXAM: CHEST  2 VIEW COMPARISON:  Chest x-ray dated March 28, 2017. FINDINGS: The heart size and mediastinal contours are within normal limits. Normal pulmonary vascularity. Atherosclerotic calcification of the aortic arch. No focal consolidation, pleural effusion, or pneumothorax. No acute osseous abnormality. Old left-sided rib fractures. IMPRESSION: No active cardiopulmonary disease. Electronically Signed   By: Titus Dubin M.D.   On: 12/03/2017 11:59     ASSESSMENT AND PLAN:   *UTI with acute encephalopathy On IV antibiotics cx pending  * Dehydration Ivf. resolved   * Hypertension Home meds   * Dysphagia Barium swallow in am   All the records are reviewed and case discussed with Care Management/Social Worker Management plans discussed with the patient, family  and they are in agreement.  CODE STATUS: FULL CODE  DVT Prophylaxis: SCDs  TOTAL TIME TAKING CARE OF THIS PATIENT: 35 minutes.   POSSIBLE D/C IN 1-2 DAYS, DEPENDING ON CLINICAL CONDITION.  Leia Alf Paighton Godette M.D on 12/04/2017 at 1:58 PM  Between 7am to 6pm - Pager - [phone]  After 6pm go to [email or web address] - password [email or web address] [facility]  Office  [phone]  CC: Primary care physician; [name], MD  Note: This dictation was prepared with Dragon dictation along with smaller phrase technology. Any transcriptional errors that result from this process are unintentional.

## 2017-12-04 NOTE — Plan of Care (Signed)
  Progressing Education: Knowledge of General Education information will improve 12/04/2017 1107 - Progressing by Cherylann Parr, RN Safety: Ability to remain free from injury will improve 12/04/2017 1107 - Progressing by Cherylann Parr, RN Urinary Elimination: Signs and symptoms of infection will decrease 12/04/2017 1107 - Progressing by Cherylann Parr, RN

## 2017-12-04 NOTE — Care Management Obs Status (Signed)
[facility] NOTIFICATION   Patient Details  Name: [name] MRN: [id number] Date of Birth: Jun 07, 1931   Medicare Observation Status Notification Given:  Yes    Burns Timson A, RN 12/04/2017, 1:41 PM

## 2017-12-05 ENCOUNTER — Encounter: Payer: Self-pay | Admitting: Anesthesiology

## 2017-12-05 ENCOUNTER — Observation Stay: Payer: Medicare HMO

## 2017-12-05 LAB — URINE CULTURE: SPECIAL REQUESTS: NORMAL

## 2017-12-05 LAB — GLUCOSE, CAPILLARY: Glucose-Capillary: 76 mg/dL (ref 65–99)

## 2017-12-05 MED ORDER — PROPOFOL 500 MG/50ML IV EMUL
INTRAVENOUS | Status: AC
  Filled 2017-12-05: qty 50

## 2017-12-05 NOTE — Plan of Care (Signed)
  Progressing Education: Knowledge of General Education information will improve 12/05/2017 2326 - Progressing by Johnna Acosta, RN Safety: Ability to remain free from injury will improve 12/05/2017 2326 - Progressing by Johnna Acosta, RN Urinary Elimination: Signs and symptoms of infection will decrease 12/05/2017 2326 - Progressing by Johnna Acosta, RN

## 2017-12-05 NOTE — [place] FL2 (Signed)
[place] LEVEL OF CARE SCREENING TOOL     IDENTIFICATION  Patient Name: [name] Birthdate: [birth year] Sex: female Admission Date (Current Location): 12/03/2017  [place] and [place] Number:  Engineering geologist and Address:  [facility], [address], [place], [place] [address]      Provider Number: [id number]  Attending Physician Name and Address:  Fritzi Mandes, MD  Relative Name and Phone Number:       Current Level of Care: Hospital Recommended Level of Care: [employer] Prior Approval Number:    Date Approved/Denied:   PASRR Number: ([id number] )  Discharge Plan: SNF    Current Diagnoses: Patient Active Problem List   Diagnosis Date Noted  . Acute encephalopathy 12/03/2017  . Dysphagia 12/03/2017  . Pressure injury of skin 03/30/2017  . Sepsis ([place]) 03/28/2017  . Pressure ulcer 06/09/2016  . Syncope 06/08/2016  . Urinary tract infection 06/08/2016  . Periprosthetic fracture around internal prosthetic left knee joint 05/25/2016  . Fracture of femur, distal, left, closed ([place]) 05/25/2016  . Closed displaced fracture of base of fourth metacarpal bone of right hand 05/25/2016  . Closed displaced fracture of base of fifth metacarpal bone of right hand 05/25/2016  . Traumatic perinephric hematoma 05/09/2016  . SAH (subarachnoid hemorrhage) ([place]) 05/08/2016  . Trauma 05/08/2016  . SDH (subdural hematoma) ([place]) 05/08/2016  . Perinephric hematoma 05/08/2016  . Hypertension 05/08/2016  . Fractures involving multiple body regions 05/08/2016  . CKD (chronic kidney disease) 05/08/2016  . Brain edema ([place]) 05/08/2016  . Acute blood loss anemia 05/08/2016    Orientation RESPIRATION BLADDER Height & Weight     Self  Normal Continent Weight: 133 lb 6.1 oz (60.5 kg) Height:  5\' 3"  (160 cm)  BEHAVIORAL SYMPTOMS/MOOD NEUROLOGICAL BOWEL NUTRITION STATUS      Continent Diet(Dysphagia 2 (Fine chop);Thin liquid )   AMBULATORY STATUS COMMUNICATION OF NEEDS Skin   Extensive Assist Verbally PU Stage and Appropriate Care(pressure ulcers on ankels, heels and buttocks. Newly epithelialized wounds to bilateral heels.  Protected with silicone foam and heel protectors from home)                       Personal Care Assistance Level of Assistance  Bathing, Feeding, Dressing Bathing Assistance: Limited assistance Feeding assistance: Independent Dressing Assistance: Limited assistance     Functional Limitations Info  Sight, Hearing, Speech Sight Info: Adequate Hearing Info: Adequate Speech Info: Adequate    SPECIAL CARE FACTORS FREQUENCY  PT (By licensed PT), OT (By licensed OT)     PT Frequency: (5) OT Frequency: (5)            Contractures      Additional Factors Info  Code Status, Allergies Code Status Info: (Full Code. ) Allergies Info: (No Known Allergies. )           Current Medications (12/05/2017):  This is the current hospital active medication list Current Facility-Administered Medications  Medication Dose Route Frequency Provider Last Rate Last Dose  . 0.9 % NaCl with KCl 20 mEq/ L  infusion   Intravenous Continuous Hillary Bow, MD 50 mL/hr at 12/05/17 0300    . acetaminophen (TYLENOL) tablet 650 mg  650 mg Oral Q6H PRN Idelle Crouch, MD       Or  . acetaminophen (TYLENOL) suppository 650 mg  650 mg Rectal Q6H PRN Idelle Crouch, MD      . amLODipine (NORVASC) tablet  5 mg  5 mg Oral Daily Idelle Crouch, MD   5 mg at 12/05/17 1024  . atenolol (TENORMIN) tablet 25 mg  25 mg Oral Daily Idelle Crouch, MD   25 mg at 12/05/17 1024  . bisacodyl (DULCOLAX) suppository 10 mg  10 mg Rectal Daily PRN Idelle Crouch, MD      . cefTRIAXone (ROCEPHIN) 1 g in sodium chloride 0.9 % 100 mL IVPB  1 g Intravenous Q24H Candelaria Stagers, [facility] [address]   Stopped at 12/04/17 1404  . cholecalciferol (VITAMIN D) tablet 1,000 Units  1,000 Units Oral Daily Idelle Crouch, MD   1,000  Units at 12/05/17 1024  . docusate sodium (COLACE) capsule 100 mg  100 mg Oral BID Idelle Crouch, MD   100 mg at 12/05/17 1027  . feeding supplement (ENSURE ENLIVE) (ENSURE ENLIVE) liquid 237 mL  237 mL Oral TID BM Idelle Crouch, MD   237 mL at 12/05/17 1037  . ferrous sulfate tablet 325 mg  325 mg Oral BID Idelle Crouch, MD   325 mg at 12/05/17 1024  . gabapentin (NEURONTIN) capsule 300 mg  300 mg Oral TID Idelle Crouch, MD   300 mg at 12/05/17 1024  . heparin injection 5,000 Units  5,000 Units Subcutaneous Q8H Idelle Crouch, MD   5,000 Units at 12/05/17 0544  . hydrALAZINE (APRESOLINE) injection 10 mg  10 mg Intravenous Q4H PRN Idelle Crouch, MD      . hydrochlorothiazide (HYDRODIURIL) tablet 25 mg  25 mg Oral Daily Idelle Crouch, MD   25 mg at 12/05/17 1024  . losartan (COZAAR) tablet 100 mg  100 mg Oral Daily Idelle Crouch, MD   100 mg at 12/05/17 1024  . multivitamin with minerals tablet 1 tablet  1 tablet Oral Daily Idelle Crouch, MD   1 tablet at 12/05/17 1024  . ondansetron (ZOFRAN) tablet 4 mg  4 mg Oral Q6H PRN Idelle Crouch, MD       Or  . ondansetron [facility]) injection 4 mg  4 mg Intravenous Q6H PRN Idelle Crouch, MD   4 mg at 12/04/17 2129  . oxybutynin (DITROPAN) tablet 5 mg  5 mg Oral BID Idelle Crouch, MD   5 mg at 12/05/17 1024  . pantoprazole (PROTONIX) injection 40 mg  40 mg Intravenous Q12H Idelle Crouch, MD   40 mg at 12/05/17 1019     Discharge Medications: Please see discharge summary for a list of discharge medications.  Relevant Imaging Results:  Relevant Lab Results:   Additional Information (SSN: [id number])  Samari Bittinger, Veronia Beets, LCSW

## 2017-12-05 NOTE — Consult Note (Signed)
[place] Nurse wound consult note Reason for Consult: Newly epithelialized wounds to bilateral heels.  Protected with silicone foam and heel protectors from home.   Wound type: resolving pressure injury  Albumin 3.2 Pressure Injury POA: Yes Measurement: Bilateral heels with newly epithelialized tissue.  Pale pink.  Pinpoint scab noted to central wound Wound SUP:JSRPR scab Drainage (amount, consistency, odor) none Periwound: new epithelialization present Dressing procedure/placement/frequency: Silicone border foam to heels.  Offload pressure with heel boots. Barrier cream to buttocks twice daily.  Will not follow at this time.  Please re-consult if needed.  Domenic Moras RN BSN [place] Pager [phone]

## 2017-12-05 NOTE — Progress Notes (Signed)
[place] [facility] [place] NAME: [name]    MR#:  [id number]  DATE OF BIRTH:  01/28/1931  SUBJECTIVE:  CHIEF COMPLAINT:   Chief Complaint  Patient presents with  . Weakness  . Emesis  . Nausea   Patient continues to have some regurgitation of food after eating. Sister in the room  REVIEW OF SYSTEMS:    Review of Systems  Constitutional: Positive for malaise/fatigue. Negative for chills and fever.  HENT: Negative for sore throat.   Eyes: Negative for blurred vision, double vision and pain.  Respiratory: Negative for cough, hemoptysis, shortness of breath and wheezing.   Cardiovascular: Negative for chest pain, palpitations, orthopnea and leg swelling.  Gastrointestinal: Negative for abdominal pain, constipation, diarrhea, heartburn, nausea and vomiting.  Genitourinary: Negative for dysuria and hematuria.  Musculoskeletal: Negative for back pain and joint pain.  Skin: Negative for rash.  Neurological: Positive for weakness. Negative for sensory change, speech change, focal weakness and headaches.  Endo/Heme/Allergies: Does not bruise/bleed easily.  Psychiatric/Behavioral: Negative for depression. The patient is not nervous/anxious.     DRUG ALLERGIES:  No Known Allergies  VITALS:  Blood pressure 124/67, pulse 85, temperature 98.1 F (36.7 C), temperature source Oral, resp. rate 16, height 5\' 3"  (1.6 m), weight 60.5 kg (133 lb 6.1 oz), SpO2 100 %.  PHYSICAL EXAMINATION:   Physical Exam  GENERAL:  84 y.o.-year-old patient lying in the bed with no acute distress. Thin cachectic EYES: Pupils equal, round, reactive to light and accommodation. No scleral icterus. Extraocular muscles intact.  HEENT: Head atraumatic, normocephalic. Oropharynx and nasopharynx clear.  NECK:  Supple, no jugular venous distention. No thyroid enlargement, no tenderness.  LUNGS: Normal breath sounds bilaterally, no wheezing, rales, rhonchi. No use of accessory  muscles of respiration.  CARDIOVASCULAR: S1, S2 normal. No murmurs, rubs, or gallops.  ABDOMEN: Soft, nontender, nondistended. Bowel sounds present. No organomegaly or mass.  EXTREMITIES: No cyanosis, clubbing or edema b/l.    NEUROLOGIC: Moves all 4 extremiites PSYCHIATRIC: The patient is alert and awake SKIN: No obvious rash, lesion, or ulcer.   LABORATORY PANEL:   CBC Recent Labs  Lab 12/04/17 0444  WBC 6.2  HGB 11.5*  HCT 34.1*  PLT 258   ------------------------------------------------------------------------------------------------------------------ Chemistries  Recent Labs  Lab 12/04/17 0444  NA 134*  K 3.4*  CL 100*  CO2 24  GLUCOSE 102*  BUN 24*  CREATININE 0.87  CALCIUM 9.2  AST 22  ALT 15  ALKPHOS 76  BILITOT 0.8   ------------------------------------------------------------------------------------------------------------------  Cardiac Enzymes Recent Labs  Lab 12/03/17 1202  TROPONINI <0.03   ------------------------------------------------------------------------------------------------------------------  RADIOLOGY:  Dg Chest 2 View  Result Date: 12/03/2017 CLINICAL DATA:  Weakness and fatigue. EXAM: CHEST  2 VIEW COMPARISON:  Chest x-ray dated March 28, 2017. FINDINGS: The heart size and mediastinal contours are within normal limits. Normal pulmonary vascularity. Atherosclerotic calcification of the aortic arch. No focal consolidation, pleural effusion, or pneumothorax. No acute osseous abnormality. Old left-sided rib fractures. IMPRESSION: No active cardiopulmonary disease. Electronically Signed   By: Titus Dubin M.D.   On: 12/03/2017 11:59   Dg Esophagus  Result Date: 12/05/2017 CLINICAL DATA:  Dysphagia, nausea, vomiting for 1 week EXAM: ESOPHOGRAM/BARIUM SWALLOW TECHNIQUE: Single contrast examination was performed using thin barium or water soluble. FLUOROSCOPY TIME:  Fluoroscopy Time:  1.9 minute Radiation Exposure Index (if provided by the  fluoroscopic device): 15.5 mGy Number of Acquired Spot Images: 0 COMPARISON:  None. FINDINGS: Limited evaluation  secondary to entirety of the examination performed in the semi upright position. Patient has difficulty moving and standing. There was normal pharyngeal anatomy and motility. Large amount of secretions throughout the esophagus. Esophagus is mildly dilated to the level of the gastroesophageal junction. No extraluminal contrast to suggest perforation. Mucosal detail is obscured by large amount of secretions. No hiatal hernia. Focal narrowing at the gastroesophageal junction most concerning for a stricture. IMPRESSION: 1. Focal narrowing at the gastroesophageal junction most concerning for a stricture. Large amount of esophageal secretions through the esophagus with mild esophageal dilatation. Recommend further evaluation with upper endoscopy. Electronically Signed   By: Kathreen Devoid   On: 12/05/2017 08:52     ASSESSMENT AND PLAN:  [name] is a 84 y.o. female has a past medical history significant for HTN, CKD, anemia, and CVA now with worsening confusion. Lives alone. Brought by family. Pt thinks she is here for problems swallowing. She c/o dysphagia and choking  *UTI with acute encephalopathy Received 3 days of IV Rocephin.  No further antibiotics needed at this point Culture shows multiple species.  Patient afebrile.  * Dehydration Ivf. resolved   * Hypertension Home meds   * Chronic dysphagia Barium swallow shows stricture.  Spoke with GI Dr. Allen Norris and he plans with doing EGD this afternoon with esophageal dilatation. Shows sister and patient are agreeable with the procedure.  *Generalized weakness deconditioning -Therapy evaluation noted.  Recommends rehab -Patient has wheelchair at home.   All the records are reviewed and case discussed with Care Management/Social Worker Management plans discussed with the patient, family and they are in agreement.  CODE STATUS: FULL  CODE  DVT Prophylaxis: SCDs  TOTAL TIME TAKING CARE OF THIS PATIENT: 25 minutes.   POSSIBLE D/C IN 1-2 DAYS, DEPENDING ON CLINICAL CONDITION.  Fritzi Mandes M.D on 12/05/2017 at 11:29 AM  Between 7am to 6pm - Pager - [phone]  After 6pm go to [email or web address] - password [email or web address] [place] [facility]  Office  [phone]  CC: Primary care physician; [name], MD  Note: This dictation was prepared with Dragon dictation along with smaller phrase technology. Any transcriptional errors that result from this process are unintentional.

## 2017-12-05 NOTE — Clinical Social Work Placement (Addendum)
   CLINICAL SOCIAL WORK PLACEMENT  NOTE  Date:  12/05/2017  Patient Details  Name: [name] MRN: [id number] Date of Birth: 28-Aug-1931  Clinical Social Work is seeking post-discharge placement for this patient at the [place] level of care (*CSW will initial, date and re-position this form in  chart as items are completed):  Yes   Patient/family provided with [place] Work Department's list of facilities offering this level of care within the geographic area requested by the patient (or if unable, by the patient's family).  Yes   Patient/family informed of their freedom to choose among providers that offer the needed level of care, that participate in Medicare, Medicaid or managed care program needed by the patient, have an available bed and are willing to accept the patient.  Yes   Patient/family informed of [facility]'s ownership interest in [facility] and [facility], as well as of the fact that they are under no obligation to receive care at these facilities.  PASRR submitted to EDS on      PASRR number received on       Existing PASRR number confirmed on 12/05/17     FL2 transmitted to all facilities in geographic area requested by pt/family on 12/05/17     FL2 transmitted to all facilities within larger geographic area on       Patient informed that his/her managed care company has contracts with or will negotiate with certain facilities, including the following:         12-06-17   Patient/family informed of bed offers received.  Patient chooses bed at  [facility]     Physician recommends and patient chooses bed at      Patient to be transferred to  [facility] on  12-06-17.  Patient to be transferred to facility by  [facility]     Patient family notified on  12-06-17 of transfer.  Name of family member notified:   Left message on sister [name]'s voice mail.     PHYSICIAN       Additional Comment:     _______________________________________________ Sample, Veronia Beets, LCSW 12/05/2017, 6:18 PM   Jones Broom. Norval Morton, MSW, [place]  12/06/2017 2:58 PM Updated

## 2017-12-05 NOTE — Clinical Social Work Note (Signed)
Clinical Social Work Assessment  Patient Details  Name: [name] MRN: [id number] Date of Birth: 18-Oct-1931  Date of referral:  12/05/17               Reason for consult:  Facility Placement                Permission sought to share information with:  Chartered certified accountant granted to share information::  Yes, Verbal Permission Granted  Name::      [name]::   [name]   Relationship::     Contact Information:     Housing/Transportation Living arrangements for the past 2 months:  [place] of Information:  Patient, Siblings Patient Interpreter Needed:  None Criminal Activity/Legal Involvement Pertinent to Current Situation/Hospitalization:  No - Comment as needed Significant Relationships:  Siblings Lives with:  Self Do you feel safe going back to the place where you live?  Yes Need for family participation in patient care:  Yes (Comment)  Care giving concerns:  Patient lives alone in [place], [place].    Social Worker assessment / plan:  Holiday representative (CSW) reviewed chart and noted that PT is recommending SNF. CSW met with patient and her sister [name] [phone] was at bedside. Per patient she lives alone and her nephew and niece check on her at night and during the day. Per patient and sister she has not walked in a long time but wants to try to go to SNF. CSW explained that [facility] will likely deny SNF patient patient has not walked in a while. Patient requested that CSW try to get [facility] authorization for SNF. Patient requested [facility]. Patient understands that if [facility] denies SNF then patient will have to D/C home. FL2 complete and faxed out. CSW started [place] authorization through [employer]. CSW will continue to follow and assist as needed.   Employment status:  Disabled (Comment on whether or not currently receiving Disability), Retired Nurse, adult PT  Recommendations:  [place] / Referral to community resources:  [place]  Patient/Family's Response to care:  Patient requested SNF [facility].   Patient/Family's Understanding of and Emotional Response to Diagnosis, Current Treatment, and Prognosis:  Patient was very pleasant and thanked CSW for assistance.   Emotional Assessment Appearance:  Appears stated age Attitude/Demeanor/Rapport:    Affect (typically observed):  Accepting, Adaptable, Pleasant Orientation:  Oriented to Self, Oriented to Place, Oriented to  Time, Oriented to Situation Alcohol / Substance use:  Not Applicable Psych involvement (Current and /or in the community):  No (Comment)  Discharge Needs  Concerns to be addressed:  Discharge Planning Concerns Readmission within the last 30 days:  No Current discharge risk:  Dependent with Mobility Barriers to Discharge:  Continued Medical Work up   [employer], Veronia Beets, LCSW 12/05/2017, 6:19 PM

## 2017-12-06 ENCOUNTER — Encounter
Admission: EM | Disposition: A | Discharge status: Home / Self Care | Payer: Self-pay | Source: Home / Self Care | Attending: Emergency Medicine

## 2017-12-06 ENCOUNTER — Encounter: Payer: Self-pay | Admitting: Certified Registered Nurse Anesthetist

## 2017-12-06 ENCOUNTER — Observation Stay: Payer: Medicare HMO | Admitting: Anesthesiology

## 2017-12-06 DIAGNOSIS — R1310 Dysphagia, unspecified: Secondary | ICD-10-CM

## 2017-12-06 DIAGNOSIS — K222 Esophageal obstruction: Secondary | ICD-10-CM

## 2017-12-06 DIAGNOSIS — T182XXA Foreign body in stomach, initial encounter: Secondary | ICD-10-CM

## 2017-12-06 HISTORY — PX: ESOPHAGOGASTRODUODENOSCOPY (EGD) WITH PROPOFOL: SHX5813

## 2017-12-06 LAB — GLUCOSE, CAPILLARY
GLUCOSE-CAPILLARY: 81 mg/dL (ref 65–99)
Glucose-Capillary: 59 mg/dL — ABNORMAL LOW (ref 65–99)
Glucose-Capillary: 63 mg/dL — ABNORMAL LOW (ref 65–99)
Glucose-Capillary: 85 mg/dL (ref 65–99)

## 2017-12-06 SURGERY — ESOPHAGOGASTRODUODENOSCOPY (EGD) WITH PROPOFOL
Anesthesia: General

## 2017-12-06 MED ORDER — SODIUM CHLORIDE 0.9 % IV SOLN
INTRAVENOUS | Status: DC
  Administered 2017-12-06: 1000 mL via INTRAVENOUS

## 2017-12-06 MED ORDER — DEXTROSE 50 % IV SOLN: 25.0000 mL | Freq: Once | INTRAVENOUS | Status: DC

## 2017-12-06 MED ORDER — PROPOFOL 500 MG/50ML IV EMUL
INTRAVENOUS | Status: AC
  Filled 2017-12-06: qty 50

## 2017-12-06 MED ORDER — GLYCOPYRROLATE 0.2 MG/ML IJ SOLN
INTRAMUSCULAR | Status: AC
  Filled 2017-12-06: qty 1

## 2017-12-06 MED ORDER — DEXTROSE 50 % IV SOLN
INTRAVENOUS | Status: AC
  Filled 2017-12-06: qty 50

## 2017-12-06 MED ORDER — GLYCOPYRROLATE 0.2 MG/ML IJ SOLN
INTRAMUSCULAR | Status: DC | PRN
  Administered 2017-12-06: 0.2 mg via INTRAVENOUS

## 2017-12-06 MED ORDER — SODIUM CHLORIDE 0.9 % IV SOLN: INTRAVENOUS | Status: DC

## 2017-12-06 MED ORDER — LIDOCAINE HCL (CARDIAC) 20 MG/ML IV SOLN
INTRAVENOUS | Status: DC | PRN
  Administered 2017-12-06: 50 mg via INTRAVENOUS

## 2017-12-06 MED ORDER — LIDOCAINE HCL (PF) 2 % IJ SOLN
INTRAMUSCULAR | Status: AC
  Filled 2017-12-06: qty 10

## 2017-12-06 MED ORDER — PROPOFOL 10 MG/ML IV BOLUS
INTRAVENOUS | Status: DC | PRN
  Administered 2017-12-06: 270 mg via INTRAVENOUS

## 2017-12-06 NOTE — Clinical Social Work Note (Addendum)
Patient to be d/c'ed today to [facility].  Patient and family agreeable to plans will transport via ems RN to call report room [phone], [phone].  Left message on patient's sister [name]'s voice mail.  Evette Cristal, MSW, [place]

## 2017-12-06 NOTE — Progress Notes (Signed)
Danielle, Zuniga ([id number]) Visit Report for 12/01/2017 Arrival Information Details Patient Name: [name], [name]. Date of Service: 12/01/2017 2:45 PM Medical Record Number: [id number] Patient Account Number: [id number] Date of Birth/Sex: 06/10/1931 (84 y.o. Female) Treating RN: Roger Shelter Primary Care Lynnlee Revels: [name] Other Clinician: Referring Chandlor Noecker: [name] Treating Deija Buhrman/Extender: Cathie Olden in Treatment: 41 Visit Information History Since Last Visit All ordered tests and consults were completed: No Patient Arrived: Wheel Chair Added or deleted any medications: No Arrival Time: 14:53 Any new allergies or adverse reactions: No Accompanied By: driver Had a fall or experienced change in No activities of daily living that may affect Transfer Assistance: Harrel Lemon Lift risk of falls: Patient Identification Verified: Yes Signs or symptoms of abuse/neglect since last visito No Secondary Verification Process Completed: Yes Hospitalized since last visit: No Patient Requires Transmission-Based No Pain Present Now: No Precautions: Patient Has Alerts: No Electronic Signature(s) Signed: 12/02/2017 1:58:36 PM By: Roger Shelter Entered By: Roger Shelter on 12/01/2017 14:54:12 Kercher, [name] ([id number]) -------------------------------------------------------------------------------- Clinic Level of Care Assessment Details Patient Name: [name]. Date of Service: 12/01/2017 2:45 PM Medical Record Number: [id number] Patient Account Number: [id number] Date of Birth/Sex: [birth year] (84 y.o. Female) Treating RN: Roger Shelter Primary Care Keala Drum: [name] Other Clinician: Referring Dariane Natzke: [name] Treating Aminat Shelburne/Extender: Cathie Olden in Treatment: 51 Clinic Level of Care Assessment Items TOOL 4 Quantity Score []  - Use when only an EandM is performed on FOLLOW-UP visit 0 ASSESSMENTS - Nursing Assessment /  Reassessment []  - Reassessment of Co-morbidities (includes updates in patient status) 0 X- 1 5 Reassessment of Adherence to Treatment Plan ASSESSMENTS - Wound and Skin Assessment / Reassessment X - Simple Wound Assessment / Reassessment - one wound 1 5 []  - 0 Complex Wound Assessment / Reassessment - multiple wounds []  - 0 Dermatologic / Skin Assessment (not related to wound area) ASSESSMENTS - Focused Assessment []  - Circumferential Edema Measurements - multi extremities 0 []  - 0 Nutritional Assessment / Counseling / Intervention []  - 0 Lower Extremity Assessment (monofilament, tuning fork, pulses) []  - 0 Peripheral Arterial Disease Assessment (using hand held doppler) ASSESSMENTS - Ostomy and/or Continence Assessment and Care []  - Incontinence Assessment and Management 0 []  - 0 Ostomy Care Assessment and Management (repouching, etc.) PROCESS - Coordination of Care X - Simple Patient / Family Education for ongoing care 1 15 []  - 0 Complex (extensive) Patient / Family Education for ongoing care []  - 0 Staff obtains Programmer, systems, Records, Test Results / Process Orders []  - 0 Staff telephones HHA, Nursing Homes / Clarify orders / etc []  - 0 Routine Transfer to another Facility (non-emergent condition) []  - 0 Routine Hospital Admission (non-emergent condition) []  - 0 New Admissions / Biomedical engineer / Ordering NPWT, Apligraf, etc. []  - 0 Emergency Hospital Admission (emergent condition) X- 1 10 Simple Discharge Coordination JENINA, MOENING ([id number]) []  - 0 Complex (extensive) Discharge Coordination PROCESS - Special Needs []  - Pediatric / Minor Patient Management 0 []  - 0 Isolation Patient Management []  - 0 Hearing / Language / Visual special needs []  - 0 Assessment of Community assistance (transportation, D/C planning, etc.) []  - 0 Additional assistance / Altered mentation []  - 0 Support Surface(s) Assessment (bed, cushion, seat, etc.) INTERVENTIONS - Wound  Cleansing / Measurement X - Simple Wound Cleansing - one wound 1 5 []  - 0 Complex Wound Cleansing - multiple wounds X- 1 5 Wound Imaging (photographs - any number of wounds) []  - 0 Wound  Tracing (instead of photographs) []  - 0 Simple Wound Measurement - one wound []  - 0 Complex Wound Measurement - multiple wounds INTERVENTIONS - Wound Dressings X - Small Wound Dressing one or multiple wounds 2 10 []  - 0 Medium Wound Dressing one or multiple wounds []  - 0 Large Wound Dressing one or multiple wounds []  - 0 Application of Medications - topical []  - 0 Application of Medications - injection INTERVENTIONS - Miscellaneous []  - External ear exam 0 []  - 0 Specimen Collection (cultures, biopsies, blood, body fluids, etc.) []  - 0 Specimen(s) / Culture(s) sent or taken to Lab for analysis []  - 0 Patient Transfer (multiple staff / Civil Service fast streamer / Similar devices) []  - 0 Simple Staple / Suture removal (25 or less) []  - 0 Complex Staple / Suture removal (26 or more) []  - 0 Hypo / Hyperglycemic Management (close monitor of Blood Glucose) []  - 0 Ankle / Brachial Index (ABI) - do not check if billed separately X- 1 5 Vital Signs Ard, Barby S. ([id number]) Has the patient been seen at the hospital within the last three years: Yes Total Score: 70 Level Of Care: New/Established - Level 2 Electronic Signature(s) Signed: 12/02/2017 1:58:36 PM By: Roger Shelter Entered By: Roger Shelter on 12/01/2017 15:45:47 [name] ([id number]) -------------------------------------------------------------------------------- Encounter Discharge Information Details Patient Name: [name]. Date of Service: 12/01/2017 2:45 PM Medical Record Number: [id number] Patient Account Number: [id number] Date of Birth/Sex: Oct 03, 1931 (84 y.o. Female) Treating RN: Roger Shelter Primary Care Eulanda Dorion: [name] Other Clinician: Referring Jadis Mika: [name] Treating Cabell Lazenby/Extender:  Cathie Olden in Treatment: 62 Encounter Discharge Information Items Discharge Pain Level: 0 Discharge Condition: Stable Ambulatory Status: Wheelchair Discharge Destination: Home Transportation: Other Accompanied By: driver Schedule Follow-up Appointment: No Medication Reconciliation completed and No provided to Patient/Care Sia Gabrielsen: Provided on Clinical Summary of Care: 12/01/2017 Form Type Recipient Paper Patient EB Electronic Signature(s) Signed: 12/05/2017 4:58:43 PM By: Ruthine Dose Entered By: Ruthine Dose on 12/01/2017 15:51:08 [name] ([id number]) -------------------------------------------------------------------------------- Lower Extremity Assessment Details Patient Name: [name]. Date of Service: 12/01/2017 2:45 PM Medical Record Number: [id number] Patient Account Number: [id number] Date of Birth/Sex: 03/02/1931 (84 y.o. Female) Treating RN: Roger Shelter Primary Care Ger Nicks: [name] Other Clinician: Referring Kenecia Barren: [name] Treating Kashawna Manzer/Extender: Cathie Olden in Treatment: 64 Edema Assessment Assessed: [Left: No] [Right: No] Edema: [Left: No] [Right: No] Vascular Assessment Claudication: Claudication Assessment [Left:None] [Right:None] Pulses: Dorsalis Pedis Palpable: [Left:Yes] [Right:Yes] Posterior Tibial Extremity colors, hair growth, and conditions: Extremity Color: [Left:Normal] [Right:Normal] Hair Growth on Extremity: [Left:No] [Right:No] Temperature of Extremity: [Right:Cool] Toe Nail Assessment Left: Right: Thick: Yes Discolored: Yes Yes Deformed: No No Improper Length and Hygiene: Yes Yes Electronic Signature(s) Signed: 12/02/2017 1:58:36 PM By: Roger Shelter Entered By: Roger Shelter on 12/01/2017 15:25:14 [name] ([id number]) -------------------------------------------------------------------------------- Multi Wound Chart Details Patient Name: [name]. Date  of Service: 12/01/2017 2:45 PM Medical Record Number: [id number] Patient Account Number: [id number] Date of Birth/Sex: 12/01/1930 (84 y.o. Female) Treating RN: Roger Shelter Primary Care Zephyr Ridley: [name] Other Clinician: Referring Abilene Mcphee: [name] Treating Maddex Garlitz/Extender: Cathie Olden in Treatment: 64 Vital Signs Height(in): 63 Pulse(bpm): 55 Weight(lbs): 160 Blood Pressure(mmHg): 139/54 Body Mass Index(BMI): 28 Temperature(F): 98.0 Respiratory Rate 16 (breaths/min): Photos: [2:No Photos] [8:No Photos] [9:No Photos] Wound Location: [2:Left Calcaneus - Medial] [8:Right Calcaneus] [9:Right Foot - Plantar] Wounding Event: [2:Pressure Injury] [8:Pressure Injury] [9:Pressure Injury] Primary Etiology: [2:Pressure Ulcer] [8:Pressure Ulcer] [9:Pressure Ulcer] Comorbid History: [2:Cataracts, Hypertension, Peripheral  Venous Disease] [8:Cataracts, Hypertension, Peripheral Venous Disease] [9:Cataracts, Hypertension, Peripheral Venous Disease] Date Acquired: [2:07/02/2016] [8:09/15/2017] [9:10/27/2017] Weeks of Treatment: [2:64] [8:9] [9:5] Wound Status: [2:Open] [8:Open] [9:Open] Measurements L x W x D [2:1x0.2x0.1] [8:1x0.4x0.1] [9:0.2x0.2x0.1] (cm) Area (cm) : [2:0.157] [8:0.314] [9:0.031] Volume (cm) : [2:0.016] [8:0.031] [9:0.003] % Reduction in Area: [2:99.20%] [8:87.70%] [9:90.60%] % Reduction in Volume: [2:99.20%] [8:93.90%] [9:90.90%] Classification: [2:Category/Stage III] [8:Category/Stage III] [9:Category/Stage II] Exudate Amount: [2:None Present] [8:None Present] [9:None Present] Wound Margin: [2:Flat and Intact] [8:Flat and Intact] [9:Flat and Intact] Granulation Amount: [2:Large (67-100%)] [8:Large (67-100%)] [9:Large (67-100%)] Granulation Quality: [2:Red] [8:Red] [9:Red] Necrotic Amount: [2:Small (1-33%)] [8:Small (1-33%)] [9:Small (1-33%)] Necrotic Tissue: [2:Eschar] [8:Eschar] [9:Eschar] Exposed Structures: [2:Fat Layer (Subcutaneous  Tissue) Exposed: Yes Fascia: No Tendon: No Muscle: No Joint: No Bone: No] [8:Fat Layer (Subcutaneous Tissue) Exposed: Yes] [9:Fascia: No Fat Layer (Subcutaneous Tissue) Exposed: No Tendon: No Muscle: No Joint: No Bone: No] Epithelialization: [2:Medium (34-66%)] [8:Large (67-100%)] [9:Large (67-100%)] Periwound Skin Texture: [2:Callus: Yes Scarring: Yes Excoriation: No Induration: No Crepitus: No Rash: No] [8:Induration: Yes Callus: Yes Scarring: Yes Excoriation: No Crepitus: No Rash: No] [9:Callus: Yes Scarring: Yes Excoriation: No Induration: No Crepitus: No Rash: No] Periwound Skin Moisture: TONETTE, KOEHNE ([id number]) Dry/Scaly: Yes Dry/Scaly: Yes Maceration: No Maceration: No Maceration: No Dry/Scaly: No Periwound Skin Color: Atrophie Blanche: No Atrophie Blanche: No Atrophie Blanche: No Cyanosis: No Cyanosis: No Cyanosis: No Ecchymosis: No Ecchymosis: No Ecchymosis: No Erythema: No Erythema: No Erythema: No Hemosiderin Staining: No Hemosiderin Staining: No Hemosiderin Staining: No Mottled: No Mottled: No Mottled: No Pallor: No Pallor: No Pallor: No Rubor: No Rubor: No Rubor: No Temperature: No Abnormality No Abnormality N/A Tenderness on Palpation: Yes Yes No Wound Preparation: Ulcer Cleansing: Ulcer Cleansing: Ulcer Cleansing: Rinsed/Irrigated with Saline Rinsed/Irrigated with Saline Rinsed/Irrigated with Saline Topical Anesthetic Applied: Topical Anesthetic Applied: Topical Anesthetic Applied: Other: lidocaine 4% Other: lidocaine 4% Other: lidocaine 4% Treatment Notes Wound #2 (Left, Medial Calcaneus) 1. Cleansed with: Clean wound with Normal Saline 2. Anesthetic Topical Lidocaine 4% cream to wound bed prior to debridement 4. Dressing Applied: Other dressing (specify in notes) Notes silvercedll, heel cup and kerlix wrap bilateral. Wound #8 (Right Calcaneus) 1. Cleansed with: Clean wound with Normal Saline 2. Anesthetic Topical Lidocaine 4% cream to  wound bed prior to debridement 4. Dressing Applied: Other dressing (specify in notes) Notes silvercedll, heel cup and kerlix wrap bilateral. Wound #9 (Right, Plantar Foot) 1. Cleansed with: Clean wound with Normal Saline 2. Anesthetic Topical Lidocaine 4% cream to wound bed prior to debridement 4. Dressing Applied: Other dressing (specify in notes) Notes silvercedll, heel cup and kerlix wrap bilateral. Electronic Signature(s) Signed: 12/01/2017 4:01:32 PM By: Eulogio Bear ([id number]) Entered By: Lawanda Cousins on 12/01/2017 16:01:31 [name] ([id number]) -------------------------------------------------------------------------------- [place] Details Patient Name: [name] Date of Service: 12/01/2017 2:45 PM Medical Record Number: [id number] Patient Account Number: [id number] Date of Birth/Sex: 02-15-1931 (84 y.o. Female) Treating RN: Roger Shelter Primary Care Myeasha Ballowe: [name] Other Clinician: Referring Airyn Ellzey: [name] Treating Bryceton Hantz/Extender: Cathie Olden in Treatment: 15 Active Inactive ` Abuse / Safety / Falls / Self Care Management Nursing Diagnoses: Impaired physical mobility Potential for falls Goals: Patient will remain injury free Date Initiated: 09/06/2016 Target Resolution Date: 12/02/2016 Goal Status: Active Interventions: Assess fall risk on admission and as needed Assess self care needs on admission and as needed Notes: ` Necrotic Tissue Nursing Diagnoses: Impaired tissue integrity related to necrotic/devitalized tissue Goals: Necrotic/devitalized tissue will  be minimized in the wound bed Date Initiated: 09/06/2016 Target Resolution Date: 12/02/2016 Goal Status: Active Interventions: Assess patient pain level pre-, during and post procedure and prior to discharge Notes: ` Orientation to the Wound Care Program Nursing Diagnoses: Knowledge deficit related to the wound  healing center program Goals: Patient/caregiver will verbalize understanding of the [place] Date Initiated: 09/06/2016 Target Resolution Date: 12/02/2016 Goal Status: Active KEMIAH, BOOZ ([id number]) Interventions: Provide education on orientation to the wound center Notes: ` Pressure Nursing Diagnoses: Knowledge deficit related to management of pressures ulcers Potential for impaired tissue integrity related to pressure, friction, moisture, and shear Goals: Patient will remain free of pressure ulcers Date Initiated: 09/06/2016 Target Resolution Date: 12/02/2016 Goal Status: Active Interventions: Assess: immobility, friction, shearing, incontinence upon admission and as needed Notes: ` Soft Tissue Infection Nursing Diagnoses: Impaired tissue integrity Potential for infection: soft tissue Goals: Patient will remain free of wound infection Date Initiated: 09/06/2016 Target Resolution Date: 12/02/2016 Goal Status: Active Interventions: Assess signs and symptoms of infection every visit Notes: ` Wound/Skin Impairment Nursing Diagnoses: Impaired tissue integrity Goals: Ulcer/skin breakdown will heal within 14 weeks Date Initiated: 09/06/2016 Target Resolution Date: 12/16/2016 Goal Status: Active Interventions: Assess ulceration(s) every visit Notes: LONI, DELBRIDGE ([id number]) Electronic Signature(s) Signed: 12/01/2017 4:43:46 PM By: Roger Shelter Entered By: Roger Shelter on 12/01/2017 16:43:46 Shimmin, [name] ([id number]) -------------------------------------------------------------------------------- Pain Assessment Details Patient Name: [name]. Date of Service: 12/01/2017 2:45 PM Medical Record Number: [id number] Patient Account Number: [id number] Date of Birth/Sex: July 30, 1931 (84 y.o. Female) Treating RN: Roger Shelter Primary Care Thena Devora: [name] Other Clinician: Referring Jaiah Weigel: [name] Treating  Calani Gick/Extender: Cathie Olden in Treatment: 58 Active Problems Location of Pain Severity and Description of Pain Patient Has Paino No Site Locations Pain Management and Medication Current Pain Management: Electronic Signature(s) Signed: 12/02/2017 1:58:36 PM By: Roger Shelter Entered By: Roger Shelter on 12/01/2017 14:54:22 Harren, [name] ([id number]) -------------------------------------------------------------------------------- Patient/Caregiver Education Details Patient Name: [name]. Date of Service: 12/01/2017 2:45 PM Medical Record Number: [id number] Patient Account Number: [id number] Date of Birth/Gender: 1931/09/13 (84 y.o. Female) Treating RN: Roger Shelter Primary Care Physician: [name] Other Clinician: Referring Physician: [name] Treating Physician/Extender: Cathie Olden in Treatment: 30 Education Assessment Education Provided To: Patient Education Topics Provided Wound/Skin Impairment: Handouts: Caring for Your Ulcer Methods: Explain/Verbal Responses: State content correctly Electronic Signature(s) Signed: 12/02/2017 1:58:36 PM By: Roger Shelter Entered By: Roger Shelter on 12/01/2017 15:47:30 Serio, [name] ([id number]) -------------------------------------------------------------------------------- Wound Assessment Details Patient Name: [name]. Date of Service: 12/01/2017 2:45 PM Medical Record Number: [id number] Patient Account Number: [id number] Date of Birth/Sex: 01/12/1931 (84 y.o. Female) Treating RN: Roger Shelter Primary Care Atavia Poppe: [name] Other Clinician: Referring Marcelina Mclaurin: [name] Treating Brynnley Dayrit/Extender: Cathie Olden in Treatment: 64 Wound Status Wound Number: 2 Primary Pressure Ulcer Etiology: Wound Location: Left Calcaneus - Medial Wound Status: Open Wounding Event: Pressure Injury Comorbid Cataracts, Hypertension, Peripheral Venous Date  Acquired: 07/02/2016 History: Disease Weeks Of Treatment: 64 Clustered Wound: No Photos Photo Uploaded By: Roger Shelter on 12/02/2017 13:54:40 Wound Measurements Length: (cm) 1 Width: (cm) 0.2 Depth: (cm) 0.1 Area: (cm) 0.157 Volume: (cm) 0.016 % Reduction in Area: 99.2% % Reduction in Volume: 99.2% Epithelialization: Medium (34-66%) Tunneling: No Undermining: No Wound Description Classification: Category/Stage III Wound Margin: Flat and Intact Exudate Amount: None Present Foul Odor After Cleansing: No Slough/Fibrino Yes Wound Bed Granulation Amount: Large (67-100%) Exposed Structure Granulation Quality: Red Fascia Exposed: No  Necrotic Amount: Small (1-33%) Fat Layer (Subcutaneous Tissue) Exposed: Yes Necrotic Quality: Eschar Tendon Exposed: No Muscle Exposed: No Joint Exposed: No Bone Exposed: No Periwound Skin Texture Texture Color No Abnormalities Noted: No No Abnormalities Noted: No AZIYAH, PROVENCAL. ([id number]) Callus: Yes Atrophie Blanche: No Crepitus: No Cyanosis: No Excoriation: No Ecchymosis: No Induration: No Erythema: No Rash: No Hemosiderin Staining: No Scarring: Yes Mottled: No Pallor: No Moisture Rubor: No No Abnormalities Noted: No Dry / Scaly: Yes Temperature / Pain Maceration: No Temperature: No Abnormality Tenderness on Palpation: Yes Wound Preparation Ulcer Cleansing: Rinsed/Irrigated with Saline Topical Anesthetic Applied: Other: lidocaine 4%, Treatment Notes Wound #2 (Left, Medial Calcaneus) 1. Cleansed with: Clean wound with Normal Saline 2. Anesthetic Topical Lidocaine 4% cream to wound bed prior to debridement 4. Dressing Applied: Other dressing (specify in notes) Notes silvercedll, heel cup and kerlix wrap bilateral. Electronic Signature(s) Signed: 12/02/2017 1:58:36 PM By: Roger Shelter Entered By: Roger Shelter on 12/01/2017 15:18:50 [name]  ([id number]) -------------------------------------------------------------------------------- Wound Assessment Details Patient Name: [name]. Date of Service: 12/01/2017 2:45 PM Medical Record Number: [id number] Patient Account Number: [id number] Date of Birth/Sex: [birth year] (84 y.o. Female) Treating RN: Roger Shelter Primary Care Hildagard Sobecki: [name] Other Clinician: Referring Arleen Bar: [name] Treating Syd Manges/Extender: Cathie Olden in Treatment: 64 Wound Status Wound Number: 8 Primary Pressure Ulcer Etiology: Wound Location: Right Calcaneus Wound Status: Open Wounding Event: Pressure Injury Comorbid Cataracts, Hypertension, Peripheral Venous Date Acquired: 09/15/2017 History: Disease Weeks Of Treatment: 9 Clustered Wound: No Photos Photo Uploaded By: Roger Shelter on 12/02/2017 13:54:40 Wound Measurements Length: (cm) 1 Width: (cm) 0.4 Depth: (cm) 0.1 Area: (cm) 0.314 Volume: (cm) 0.031 % Reduction in Area: 87.7% % Reduction in Volume: 93.9% Epithelialization: Large (67-100%) Tunneling: No Undermining: No Wound Description Classification: Category/Stage III Wound Margin: Flat and Intact Exudate Amount: None Present Foul Odor After Cleansing: No Slough/Fibrino Yes Wound Bed Granulation Amount: Large (67-100%) Exposed Structure Granulation Quality: Red Fat Layer (Subcutaneous Tissue) Exposed: Yes Necrotic Amount: Small (1-33%) Necrotic Quality: Eschar Periwound Skin Texture Texture Color No Abnormalities Noted: No No Abnormalities Noted: No Callus: Yes Atrophie Blanche: No Crepitus: No Cyanosis: No Excoriation: No Ecchymosis: No ROSALIA, MCAVOY. ([id number]) Induration: Yes Erythema: No Rash: No Hemosiderin Staining: No Scarring: Yes Mottled: No Pallor: No Moisture Rubor: No No Abnormalities Noted: No Dry / Scaly: Yes Temperature / Pain Maceration: No Temperature: No Abnormality Tenderness on Palpation:  Yes Wound Preparation Ulcer Cleansing: Rinsed/Irrigated with Saline Topical Anesthetic Applied: Other: lidocaine 4%, Treatment Notes Wound #8 (Right Calcaneus) 1. Cleansed with: Clean wound with Normal Saline 2. Anesthetic Topical Lidocaine 4% cream to wound bed prior to debridement 4. Dressing Applied: Other dressing (specify in notes) Notes silvercedll, heel cup and kerlix wrap bilateral. Electronic Signature(s) Signed: 12/02/2017 1:58:36 PM By: Roger Shelter Entered By: Roger Shelter on 12/01/2017 15:19:31 Polizzi, [name] ([id number]) -------------------------------------------------------------------------------- Wound Assessment Details Patient Name: [name]. Date of Service: 12/01/2017 2:45 PM Medical Record Number: [id number] Patient Account Number: [id number] Date of Birth/Sex: June 04, 1931 (84 y.o. Female) Treating RN: Roger Shelter Primary Care Delona Clasby: [name] Other Clinician: Referring Warden Buffa: [name] Treating Khristine Verno/Extender: Cathie Olden in Treatment: 64 Wound Status Wound Number: 9 Primary Pressure Ulcer Etiology: Wound Location: Right Foot - Plantar Wound Status: Open Wounding Event: Pressure Injury Comorbid Cataracts, Hypertension, Peripheral Venous Date Acquired: 10/27/2017 History: Disease Weeks Of Treatment: 5 Clustered Wound: No Photos Photo Uploaded By: Roger Shelter on 12/02/2017 13:54:59 Wound Measurements Length: (cm) 0.2 Width: (cm)  0.2 Depth: (cm) 0.1 Area: (cm) 0.031 Volume: (cm) 0.003 % Reduction in Area: 90.6% % Reduction in Volume: 90.9% Epithelialization: Large (67-100%) Tunneling: No Undermining: No Wound Description Classification: Category/Stage II Wound Margin: Flat and Intact Exudate Amount: None Present Foul Odor After Cleansing: No Slough/Fibrino Yes Wound Bed Granulation Amount: Large (67-100%) Exposed Structure Granulation Quality: Red Fascia Exposed: No Necrotic  Amount: Small (1-33%) Fat Layer (Subcutaneous Tissue) Exposed: No Necrotic Quality: Eschar Tendon Exposed: No Muscle Exposed: No Joint Exposed: No Bone Exposed: No Periwound Skin Texture Texture Color No Abnormalities Noted: No No Abnormalities Noted: No ASHLEN, KIGER. ([id number]) Callus: Yes Atrophie Blanche: No Crepitus: No Cyanosis: No Excoriation: No Ecchymosis: No Induration: No Erythema: No Rash: No Hemosiderin Staining: No Scarring: Yes Mottled: No Pallor: No Moisture Rubor: No No Abnormalities Noted: No Dry / Scaly: No Maceration: No Wound Preparation Ulcer Cleansing: Rinsed/Irrigated with Saline Topical Anesthetic Applied: Other: lidocaine 4%, Treatment Notes Wound #9 (Right, Plantar Foot) 1. Cleansed with: Clean wound with Normal Saline 2. Anesthetic Topical Lidocaine 4% cream to wound bed prior to debridement 4. Dressing Applied: Other dressing (specify in notes) Notes silvercedll, heel cup and kerlix wrap bilateral. Electronic Signature(s) Signed: 12/02/2017 1:58:36 PM By: Roger Shelter Entered By: Roger Shelter on 12/01/2017 15:19:58 Canter, [name] ([id number]) -------------------------------------------------------------------------------- Vitals Details Patient Name: [name]. Date of Service: 12/01/2017 2:45 PM Medical Record Number: [id number] Patient Account Number: [id number] Date of Birth/Sex: Apr 01, 1931 (84 y.o. Female) Treating RN: Roger Shelter Primary Care Jaquaya Coyle: [name] Other Clinician: Referring [place] Gamero: [name] Treating Navdeep Halt/Extender: Cathie Olden in Treatment: 40 Vital Signs Time Taken: 02:54 Temperature (F): 98.0 Height (in): 63 Pulse (bpm): 55 Weight (lbs): 160 Respiratory Rate (breaths/min): 16 Body Mass Index (BMI): 28.3 Blood Pressure (mmHg): 139/54 Reference Range: 80 - 120 mg / dl Electronic Signature(s) Signed: 12/02/2017 1:58:36 PM By: Roger Shelter Entered By:  Roger Shelter on 12/01/2017 14:54:41

## 2017-12-06 NOTE — Op Note (Signed)
[facility] Gastroenterology Patient Name: [name] Procedure Date: 12/06/2017 8:12 AM MRN: [id number] Account #: [id number] Date of Birth: Feb 25, 1931 Admit Type: Inpatient Age: 84 Room: [facility] ENDO ROOM 4 Gender: Female Note Status: Finalized Procedure:            Upper GI endoscopy Indications:          Dysphagia Providers:            Lucilla Lame MD, MD Referring MD:         [name] (Referring MD) Medicines:            Propofol per Anesthesia Complications:        No immediate complications. Procedure:            Pre-Anesthesia Assessment:                       - Prior to the procedure, a History and Physical was                        performed, and patient medications and allergies were                        reviewed. The patient's tolerance of previous                        anesthesia was also reviewed. The risks and benefits of                        the procedure and the sedation options and risks were                        discussed with the patient. All questions were                        answered, and informed consent was obtained. Prior                        Anticoagulants: The patient has taken no previous                        anticoagulant or antiplatelet agents. ASA Grade                        Assessment: II - A patient with mild systemic disease.                        After reviewing the risks and benefits, the patient was                        deemed in satisfactory condition to undergo the                        procedure.                       After obtaining informed consent, the endoscope was                        passed under direct vision. Throughout the procedure,  the patient's blood pressure, pulse, and oxygen                        saturations were monitored continuously. The Endoscope                        was introduced through the mouth, and advanced to the                        second part  of duodenum. The upper GI endoscopy was                        accomplished without difficulty. The patient tolerated                        the procedure well. Findings:      A medium-sized hiatal hernia was present.      One moderate benign-appearing, intrinsic stenosis was found at the       gastroesophageal junction. And was traversed. A TTS dilator was passed       through the scope. Dilation with a 15-16.5-18 mm balloon dilator was       performed to 18 mm. The dilation site was examined following endoscope       reinsertion and showed complete resolution of luminal narrowing.      Two bottle caps were found in the gastric antrum. Removal was       accomplished with a rat-toothed forceps.      The examined duodenum was normal.      An overtube was placed to protect the patient airway during the       procedure. Impression:           - Medium-sized hiatal hernia.                       - Benign-appearing esophageal stenosis. Dilated.                       - Two bottle caps were found in the stomach. Removal                        was successful.                       - Normal examined duodenum.                       - An overtube was placed to protect the patient airway                        during the procedure. Recommendation:       - Return patient to hospital ward for ongoing care.                       - Advance diet as tolerated.                       - Continue present medications. Procedure Code(s):    --- Professional ---                       [phone], Esophagogastroduodenoscopy, flexible, transoral;  with removal of foreign body(s)                       43249, Esophagogastroduodenoscopy, flexible, transoral;                        with transendoscopic balloon dilation of esophagus                        (less than 30 mm diameter) Diagnosis Code(s):    --- Professional ---                       R13.10, Dysphagia, unspecified                        T18.2XXA, Foreign body in stomach, initial encounter                       K22.2, Esophageal obstruction CPT copyright 2016 American Medical Association. All rights reserved. The codes documented in this report are preliminary and upon coder review may  be revised to meet current compliance requirements. Lucilla Lame MD, MD 12/06/2017 8:45:06 AM This report has been signed electronically. Number of Addenda: 0 Note Initiated On: 12/06/2017 8:12 AM      [facility]

## 2017-12-06 NOTE — Progress Notes (Addendum)
Pt prepared for d/c to SNF. IV d/c'd. Skin intact except as charted in most recent assessments. Vitals are stable. Suprapubic catheter cleaned.Wound dressing have been change prior to transport. Report called to receiving facility. Pt to be transported by ambulance service.  [name] [employer]

## 2017-12-06 NOTE — Discharge Summary (Addendum)
[place] [facility] [place] NAME: [name]    MR#:  [id number]  DATE OF BIRTH:  09/21/1931  DATE OF ADMISSION:  12/03/2017 ADMITTING PHYSICIAN: Idelle Crouch, MD  DATE OF DISCHARGE: 12/06/2017  PRIMARY CARE PHYSICIAN: Juluis Pitch, MD    ADMISSION DIAGNOSIS:  Lower urinary tract infection, acute [N39.0]  DISCHARGE DIAGNOSIS:   Chronic dysphasia secondary to intrinsic stenosis status post EGD with dilatation. UTI treated with IV antibiotics for 3 days. Acute dehydration resolved SECONDARY DIAGNOSIS:   Past Medical History:  Diagnosis Date  . Anemia   . Chronic kidney disease, stage 3 ([place])   . Degenerative joint disease (DJD) of lumbar spine   . Diastolic dysfunction   . Dizziness   . Edema   . ETD (eustachian tube dysfunction)   . Heartburn   . History of rectal bleeding   . HTN (hypertension)   . Hypercalcemia   . Intracranial aneurysm   . Low vision, one eye   . Nephritis and nephropathy   . Osteoporosis   . Pyuria   . Urge incontinence   . Urinary frequency   . Urinary retention     HOSPITAL COURSE:  Danielle Paganelli [place] a 84 y.o.femalehas a past medical history significant forHTN, CKD, anemia, and CVA now with worsening confusion. Lives alone. Brought by family. Pt thinks she is here for problems swallowing. She c/o dysphagia and choking  *UTI with acute encephalopathy Received 3 days of IV Rocephin.  No further antibiotics needed at this point Culture shows multiple species.  Patient afebrile.  * Dehydration Ivf. resolved  *Hypertension Home meds  *Chronic dysphagia Barium swallow shows stricture.   -Patient is status post EGD with dilatation of her intrinsic benign-appearing stricture by Dr. Allen Norris [facility] -Incidental note was made for to bottle caps were found which were successfully removed from the stomach.  *Generalized weakness deconditioning -Therapy evaluation noted.  Recommends  rehab -Patient has wheelchair at home.  Patient is tolerating diet.  She will be discharged to rehab today on dysphagia  2 with thin liquid.  I did leave a message for patient's sister and updated her regarding patient's procedure and she discharging to rehab today.  CONSULTS OBTAINED:  Treatment Team:  Lucilla Lame, MD  DRUG ALLERGIES:  No Known Allergies  DISCHARGE MEDICATIONS:   Allergies as of 12/06/2017   No Known Allergies     Medication List    STOP taking these medications   mirabegron ER 25 MG Tb24 tablet Commonly known as:  MYRBETRIQ     TAKE these medications   acetaminophen 325 MG tablet Commonly known as:  TYLENOL Take 650 mg by mouth 2 (two) times daily as needed.   amLODipine 10 MG tablet Commonly known as:  NORVASC TAKE ONE TABLET BY MOUTH EVERY DAY HIGH BLOOD PRESSURE   atenolol 25 MG tablet Commonly known as:  TENORMIN Take 1 tablet (25 mg total) by mouth daily.   CALCIUM CITRATE+D3 PETITES 200-250 MG-UNIT Tabs Generic drug:  Calcium Citrate-Vitamin D Take 1 tablet by mouth daily.   cholecalciferol 1000 units tablet Commonly known as:  VITAMIN D Take 1,000 Units by mouth daily.   feeding supplement (ENSURE ENLIVE) Liqd Take 237 mLs by mouth 3 (three) times daily between meals.   FEROSUL 325 (65 FE) MG tablet Generic drug:  ferrous sulfate Take 325 mg by mouth 2 (two) times daily.   furosemide 20 MG tablet Commonly known as:  LASIX Take 20  mg by mouth daily.   gabapentin 300 MG capsule Commonly known as:  NEURONTIN Take 300 mg by mouth 3 (three) times daily.   hydrochlorothiazide 25 MG tablet Commonly known as:  HYDRODIURIL Take 25 mg by mouth daily.   hydrOXYzine 10 MG tablet Commonly known as:  ATARAX/VISTARIL Take 10 mg by mouth 3 (three) times daily as needed.   losartan 100 MG tablet Commonly known as:  COZAAR Take 1 tablet (100 mg total) by mouth daily.   multivitamin tablet Take 1 tablet by mouth daily.   nystatin  100000 UNIT/ML suspension Commonly known as:  MYCOSTATIN Take 5 mLs (500,000 Units total) by mouth 4 (four) times daily.   oxybutynin 5 MG tablet Commonly known as:  DITROPAN Take 5 mg by mouth 2 (two) times daily.   pantoprazole 40 MG tablet Commonly known as:  PROTONIX Take 1 tablet (40 mg total) by mouth daily.       If you experience worsening of your admission symptoms, develop shortness of breath, life threatening emergency, suicidal or homicidal thoughts you must seek medical attention immediately by calling 911 or calling your MD immediately  if symptoms less severe.  You Must read complete instructions/literature along with all the possible adverse reactions/side effects for all the Medicines you take and that have been prescribed to you. Take any new Medicines after you have completely understood and accept all the possible adverse reactions/side effects.   Please note  You were cared for by a hospitalist during your hospital stay. If you have any questions about your discharge medications or the care you received while you were in the hospital after you are discharged, you can call the unit and asked to speak with the hospitalist on call if the hospitalist that took care of you is not available. Once you are discharged, your primary care physician will handle any further medical issues. Please note that NO REFILLS for any discharge medications will be authorized once you are discharged, as it is imperative that you return to your primary care physician (or establish a relationship with a primary care physician if you do not have one) for your aftercare needs so that they can reassess your need for medications and monitor your lab values. Today   SUBJECTIVE   Doing well.  Tolerating oral diet.  VITAL SIGNS:  Blood pressure (!) 153/66, pulse 67, temperature (!) 97.5 F (36.4 C), temperature source Oral, resp. rate 19, height 5\' 3"  (1.6 m), weight 61.6 kg (135 lb 12.9 oz), SpO2  100 %.  I/O:    Intake/Output Summary (Last 24 hours) at 12/06/2017 1249 Last data filed at 12/06/2017 0844 Gross per 24 hour  Intake 2427.5 ml  Output 950 ml  Net 1477.5 ml    PHYSICAL EXAMINATION:  GENERAL:  84 y.o.-year-old patient lying in the bed with no acute distress.  Thin cachectic EYES: Pupils equal, round, reactive to light and accommodation. No scleral icterus. Extraocular muscles intact.  HEENT: Head atraumatic, normocephalic. Oropharynx and nasopharynx clear.  NECK:  Supple, no jugular venous distention. No thyroid enlargement, no tenderness.  LUNGS: Normal breath sounds bilaterally, no wheezing, rales,rhonchi or crepitation. No use of accessory muscles of respiration.  CARDIOVASCULAR: S1, S2 normal. No murmurs, rubs, or gallops.  ABDOMEN: Soft, non-tender, non-distended. Bowel sounds present. No organomegaly or mass.  EXTREMITIES: No pedal edema, cyanosis, or clubbing.  NEUROLOGIC: Cranial nerves II through XII are intact. Muscle strength 5/5 in all extremities. Sensation intact. Gait not checked.  PSYCHIATRIC: The  patient is alert and oriented x 3.  SKIN: No obvious rash, lesion, or ulcer.   DATA REVIEW:   CBC  Recent Labs  Lab 12/04/17 0444  WBC 6.2  HGB 11.5*  HCT 34.1*  PLT 258    Chemistries  Recent Labs  Lab 12/04/17 0444  NA 134*  K 3.4*  CL 100*  CO2 24  GLUCOSE 102*  BUN 24*  CREATININE 0.87  CALCIUM 9.2  AST 22  ALT 15  ALKPHOS 76  BILITOT 0.8    Microbiology Results   Recent Results (from the past 240 hour(s))  Urine culture     Status: Abnormal   Collection Time: 12/03/17 12:29 PM  Result Value Ref Range Status   Specimen Description   Final    URINE, RANDOM Performed at [facility], [address], [place], [place] [address]    Special Requests   Final    Normal Performed at [facility], [address] [place],  [address]    Culture MULTIPLE SPECIES PRESENT, SUGGEST RECOLLECTION (A)  Final    Report Status 12/05/2017 FINAL  Final    RADIOLOGY:  Dg Esophagus  Result Date: 12/05/2017 CLINICAL DATA:  Dysphagia, nausea, vomiting for 1 week EXAM: ESOPHOGRAM/BARIUM SWALLOW TECHNIQUE: Single contrast examination was performed using thin barium or water soluble. FLUOROSCOPY TIME:  Fluoroscopy Time:  1.9 minute Radiation Exposure Index (if provided by the fluoroscopic device): 15.5 mGy Number of Acquired Spot Images: 0 COMPARISON:  None. FINDINGS: Limited evaluation secondary to entirety of the examination performed in the semi upright position. Patient has difficulty moving and standing. There was normal pharyngeal anatomy and motility. Large amount of secretions throughout the esophagus. Esophagus is mildly dilated to the level of the gastroesophageal junction. No extraluminal contrast to suggest perforation. Mucosal detail is obscured by large amount of secretions. No hiatal hernia. Focal narrowing at the gastroesophageal junction most concerning for a stricture. IMPRESSION: 1. Focal narrowing at the gastroesophageal junction most concerning for a stricture. Large amount of esophageal secretions through the esophagus with mild esophageal dilatation. Recommend further evaluation with upper endoscopy. Electronically Signed   By: Kathreen Devoid   On: 12/05/2017 08:52     Management plans discussed with the patient, family and they are in agreement.  CODE STATUS:     Code Status Orders  (From admission, onward)        Start     Ordered   12/03/17 1507  Full code  Continuous     12/03/17 1506    Code Status History    Date Active Date Inactive Code Status Order ID Comments User Context   03/28/2017 16:49 03/30/2017 21:57 DNR [id number]  Hillary Bow, MD ED   06/08/2016 20:40 06/11/2016 21:41 Full Code [id number]  Hower, Aaron Mose, MD ED    Advance Directive Documentation     Most Recent Value  Type of Advance Directive  Out of facility DNR (pink MOST or yellow form)  Pre-existing out of  facility DNR order (yellow form or pink MOST form)  No data  "MOST" Form in Place?  No data      TOTAL TIME TAKING CARE OF THIS PATIENT: 40 minutes.    Fritzi Mandes M.D on 12/06/2017 at 12:49 PM  Between 7am to 6pm - Pager - [phone] After 6pm go to [email or web address] - password [email or web address] [facility]  Office  [phone]  CC: Primary care physician; Juluis Pitch, MD

## 2017-12-06 NOTE — Progress Notes (Addendum)
Social work Theatre manager presented bed offers to patient. Patient chose [facility]. [name], admission coordinator is aware of accepted bed offer. CSW received [facility] SN[facility] authorization for [facility]. Authorization reference number is [id number]. Patient is aware of above. Social work Theatre manager contacted patient's sister [name] and made her aware of above. Patient can discharge to [facility] when medically stable.  Susy Frizzle, Social Work Intern [phone]

## 2017-12-06 NOTE — Anesthesia Post-op Follow-up Note (Signed)
Anesthesia QCDR form completed.        

## 2017-12-06 NOTE — Progress Notes (Signed)
Family Meeting Note  Advance Directive:{yes  Today a meeting took place with thepateint  The following were discussed:Patient's diagnosis: , Patient's progosis: Patient admitted with chronic dysphagia altered mental status lethargic and UTI.  She is overall better.  Discussed CODE STATUS with patient.  She is a DNR.  DNR request form has been signed by her primary care physician.   Time spent during discussion 16 mins Fritzi Mandes, MD

## 2017-12-06 NOTE — Transfer of Care (Signed)
Immediate Anesthesia Transfer of Care Note  Patient: [name]  Procedure(s) Performed: ESOPHAGOGASTRODUODENOSCOPY (EGD) WITH PROPOFOL (N/A )  Patient Location: PACU and Endoscopy Unit  Anesthesia Type:General  Level of Consciousness: drowsy  Airway & Oxygen Therapy: Patient Spontanous Breathing and Patient connected to nasal cannula oxygen  Post-op Assessment: Report given to RN and Post -op Vital signs reviewed and stable  Post vital signs: Reviewed and stable  Last Vitals:  Vitals:   12/06/17 0430 12/06/17 0723  BP: (!) 148/74 (!) 150/56  Pulse: 67 (!) 50  Resp: 18   Temp: 36.4 C (!) 36 C  SpO2: 99% 100%    Last Pain:  Vitals:   12/06/17 0723  TempSrc: Tympanic         Complications: No apparent anesthesia complications

## 2017-12-06 NOTE — Progress Notes (Signed)
  Speech Language Pathology Treatment: Dysphagia  Patient Details Name: [name] MRN: [id number] DOB: [birth year] Today's Date: 12/06/2017 Time: 1435-1510 SLP Time Calculation (min) (ACUTE ONLY): 35 min  Assessment / Plan / Recommendation Clinical Impression  Pt seen for ongoing assessment of toleration of oral diet; safety swallowing w/ oral diet. Pt had GI dilatation performed this morning for moderate intrinsic stenosis. Pt is also Edentulous. SLP ordered a Dysphagia level 2(MINCED FOODS) w/ thin liquids due to the above; MD agreed.  Pt presents w/ grossly adequate oropharyngeal phase swallow function w/ no apparent, overt oropharyngeal phase deficits during po trials at this tx session. Pt fed self her level 2(MINCED) diet w/ thin liquids given tray setup. Pt exhibited no overt s/s of aspiration post swallowing trials(thin liquids via cup/straw) - no coughing/throat clearing, no wet vocal quality b/t trials, no decline in respiratory effort/status post trials. Oral phase appeared [facility] for trials of this consistency of foods(MINCED) - timely bolus management, mastication, and A-P transfer for swallowing; oral clearing b/t trials was fairly appropriate. Pt encouraged/educated on need to alternate foods and liquids to aid mastication/oral clearing AND Esophageal clearing w/ the textured foods; no significant oral weakness noted w/ oral motor movements of bolus management. Pt is edentulous which impacts mastication effectiveness. Pt required tray setup but fed self w/ little assistance needed but reduced distractions was helpful d/t distractability. Recommend a Dysphagia level 2(MINCED foods moistened well) diet consistency w/ Thin liquids; general aspiration precautions; Pills in PUREE for easier, safer swallowing AND Esophageal clearing - check w/ Pharmacy for other forms as needed. Pt appears at her baseline. No further skilled ST services indicated at this time. Dietician to f/u for nutritional  support.     HPI HPI: Pt is a 84 y.o. female has a past medical history significant for HTN, CKD, anemia, and CVA now with worsening confusion. Lives alone. Brought by family. Pt thinks she is here for problems swallowing. She c/o dysphagia and choking. Family states she is confused. Not eating. Some vomiting and ? Diarrhea. In Er, pt noted to have UTI with hyponatremia and hypokalemia. During this admission, BSE indicated potential Esophageal dysmotility. GI f/u led to EGD noting moderate benign-appearing, intrinsic stenosis found at the GE junction(dilation performed by GI); A medium-sized hiatal hernia was present; bottle caps in the stomach(removed by GI during procedure). Pt does require some direction and cues for follow through w/ tasks - unsure of baseline Cognitive status.       SLP Plan  All goals met       Recommendations  Diet recommendations: Dysphagia 2 (fine chop);Thin liquid(moistened well) Liquids provided via: Cup;Straw Medication Administration: Whole meds with puree(Crushed if needed/able to for safer, easier swallowing) Supervision: Patient able to self feed;Intermittent supervision to cue for compensatory strategies Compensations: Minimize environmental distractions;Slow rate;Small sips/bites;Lingual sweep for clearance of pocketing;Multiple dry swallows after each bite/sip;Follow solids with liquid Postural Changes and/or Swallow Maneuvers: Seated upright 90 degrees;Upright 30-60 min after meal                General recommendations: (Dietician f/u for nutritional support - Ensure ordered) Oral Care Recommendations: Oral care BID;Patient independent with oral care;Staff/trained caregiver to provide oral care Follow up Recommendations: None SLP Visit Diagnosis: Dysphagia, oropharyngeal phase (R13.12)(Esophageal phase dysphagia) Plan: All goals met       GO               [name], MS, CCC-SLP Watson,Katherine 12/06/2017, 3:11 PM

## 2017-12-06 NOTE — Anesthesia Preprocedure Evaluation (Signed)
Anesthesia Evaluation  Patient identified by MRN, date of birth, ID band Patient awake    Reviewed: Allergy & Precautions, H&P , NPO status , Patient's Chart, lab work & pertinent test results, reviewed documented beta blocker date and time   Airway Mallampati: II   Neck ROM: full    Dental  (+) Poor Dentition   Pulmonary neg pulmonary ROS, former smoker,    Pulmonary exam normal        Cardiovascular Exercise Tolerance: Poor hypertension, On Medications + Peripheral Vascular Disease  negative cardio ROS Normal cardiovascular exam Rhythm:regular Rate:Normal     Neuro/Psych negative neurological ROS  negative psych ROS   GI/Hepatic negative GI ROS, Neg liver ROS,   Endo/Other  negative endocrine ROSneg diabetesBS noted with no report of DM.  BS 61 and without sx.  Will recheck BS after procedure.  Hold D50 .Marland Kitchen JA  Renal/GU Renal diseasenegative Renal ROS  negative genitourinary   Musculoskeletal   Abdominal   Peds  Hematology negative hematology ROS (+) anemia ,   Anesthesia Other Findings Past Medical History: No date: Anemia No date: Chronic kidney disease, stage 3 (HCC) No date: Degenerative joint disease (DJD) of lumbar spine No date: Diastolic dysfunction No date: Dizziness No date: Edema No date: ETD (eustachian tube dysfunction) No date: Heartburn No date: History of rectal bleeding No date: HTN (hypertension) No date: Hypercalcemia No date: Intracranial aneurysm No date: Low vision, one eye No date: Nephritis and nephropathy No date: Osteoporosis No date: Pyuria No date: Urge incontinence No date: Urinary frequency No date: Urinary retention Past Surgical History: No date: dilation of esophageal web No date: FRACTURE SURGERY; Left     Comment:  [facility]ur 08/05/2016: IR GENERIC HISTORICAL     Comment:  IR CATHETER TUBE CHANGE 08/05/2016 [name], MD               [facility] 09/17/2016:  IR GENERIC HISTORICAL     Comment:  IR CATHETER TUBE CHANGE 09/17/2016 [facility] 11/19/2016: IR GENERIC HISTORICAL     Comment:  IR CATHETER TUBE CHANGE 11/19/2016 [name], MD               [facility] 11/29/2016: IR GENERIC HISTORICAL     Comment:  IR CATHETER TUBE CHANGE 11/29/2016 [name], DO               [facility] No date: JOINT REPLACEMENT; Bilateral No date: TOTAL KNEE ARTHROPLASTY; Bilateral 1967: VAGINAL HYSTERECTOMY     Comment:  ovaries also removed because of fibroida BMI    Body Mass Index:  24.06 kg/m     Reproductive/Obstetrics negative OB ROS                             Anesthesia Physical Anesthesia Plan  ASA: III  Anesthesia Plan: General   Post-op Pain Management:    Induction:   PONV Risk Score and Plan:   Airway Management Planned:   Additional Equipment:   Intra-op Plan:   Post-operative Plan:   Informed Consent: I have reviewed the patients History and Physical, chart, labs and discussed the procedure including the risks, benefits and alternatives for the proposed anesthesia with the patient or authorized representative who has indicated his/her understanding and acceptance.   Dental Advisory Given  Plan Discussed with: CRNA  Anesthesia Plan Comments:         Anesthesia Quick Evaluation

## 2017-12-07 IMAGING — CT CT HEAD W/O CM
3 series · 15 of 47 positions shown, 18 images · non-contrast
Comparison: MRI brain dated 06/09/2016.

CLINICAL DATA: Code sepsis, altered mental status

EXAM:
CT HEAD WITHOUT CONTRAST
TECHNIQUE: Contiguous axial images were obtained from the base of the skull
through the vertex without intravenous contrast.

[Series 2: head wo · axial · 0.47mm/px · z∈[+305,+430]mm · 9 of 30 slices shown, 12 images]
[im 3/30  brain]
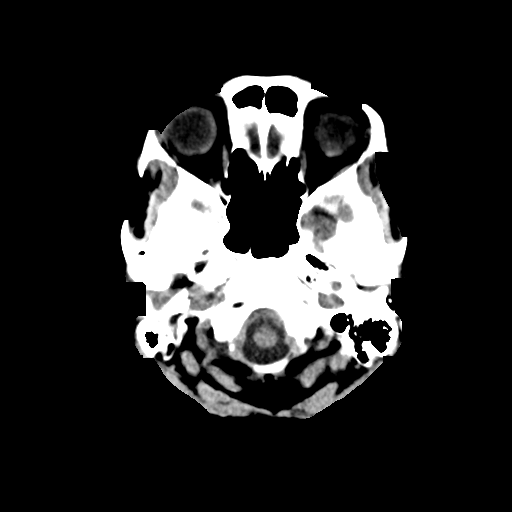
[im 3/30  bone]
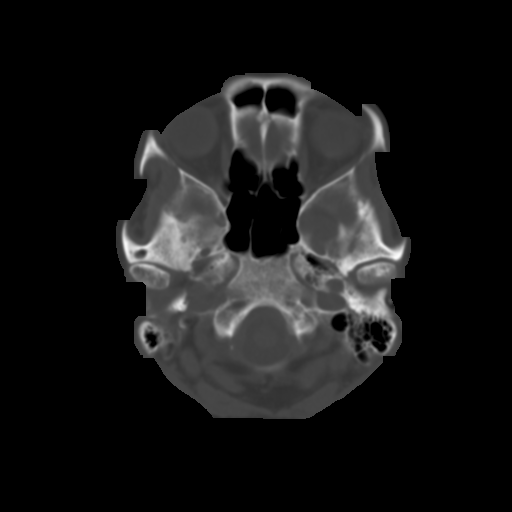
[im 6/30  brain]
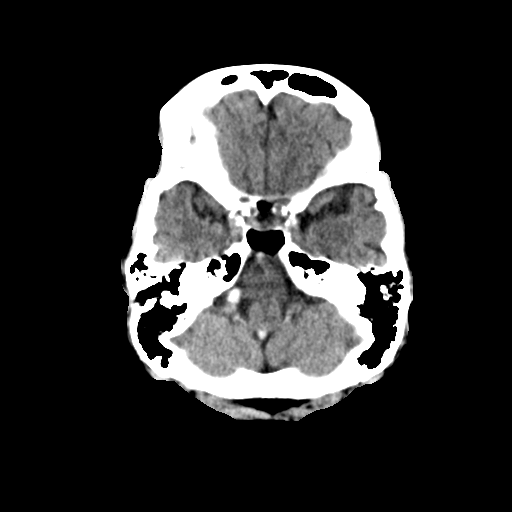
[im 9/30  brain]
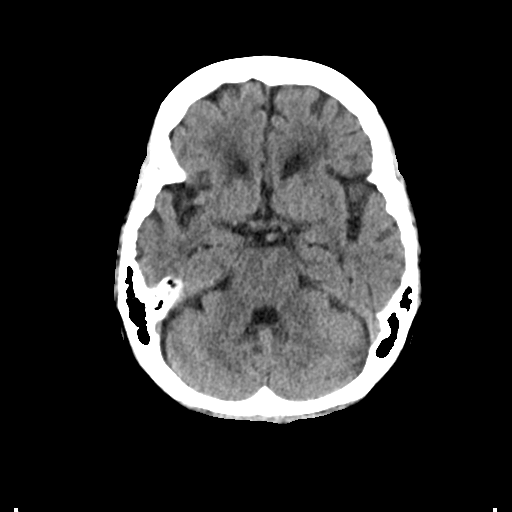
[im 12/30  brain]
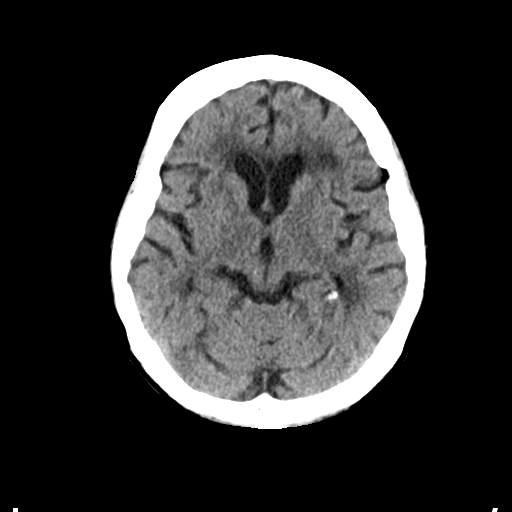
[im 16/30  brain]
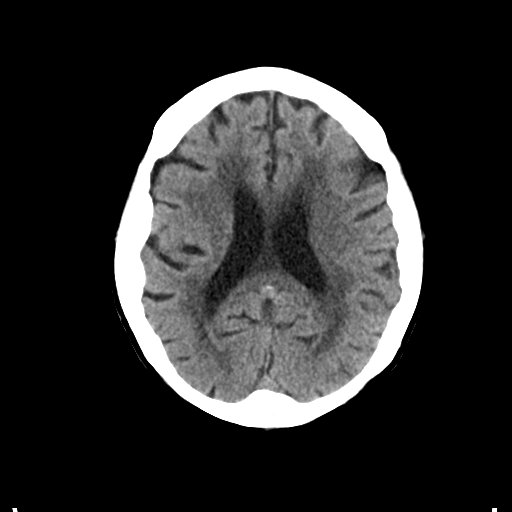
[im 16/30  bone]
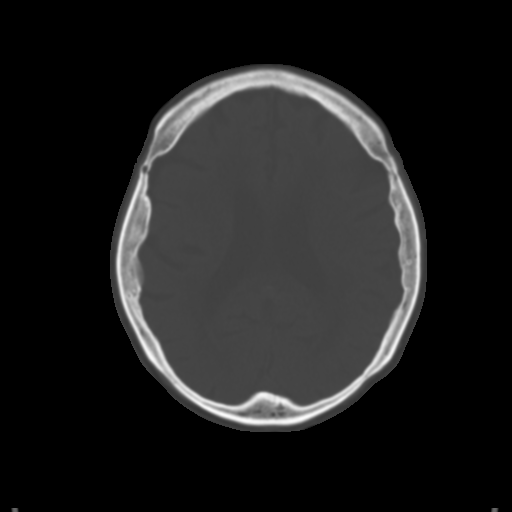
[im 19/30  brain]
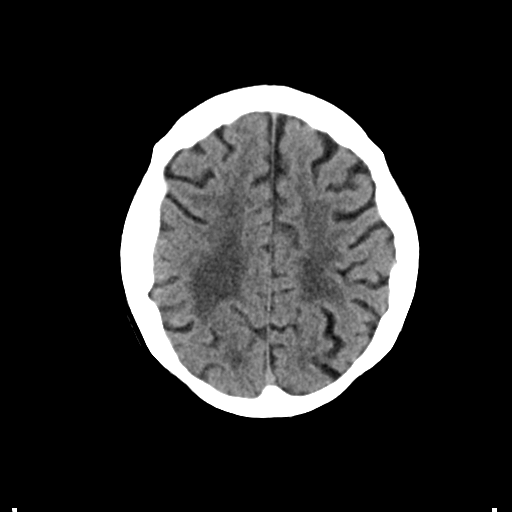
[im 22/30  brain]
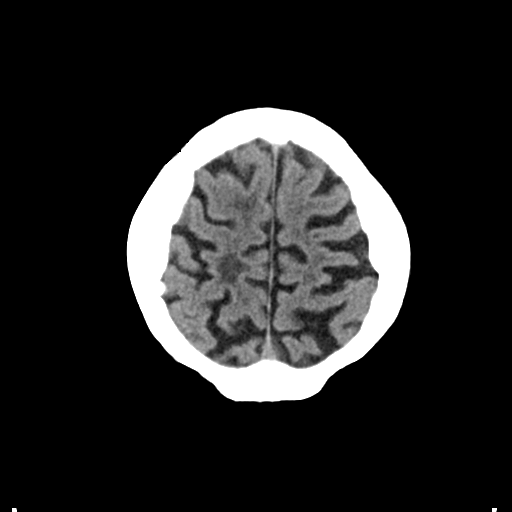
[im 25/30  brain]
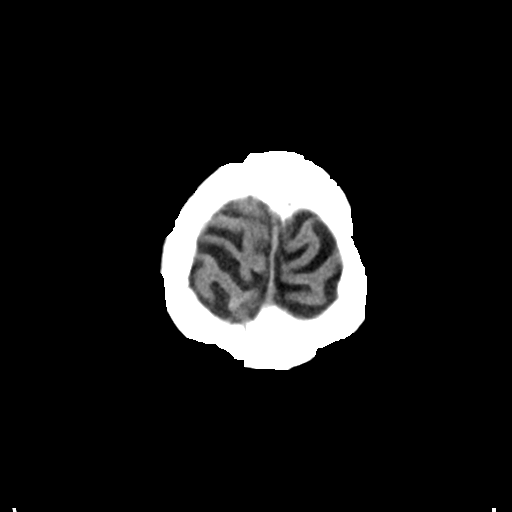
[im 28/30  brain]
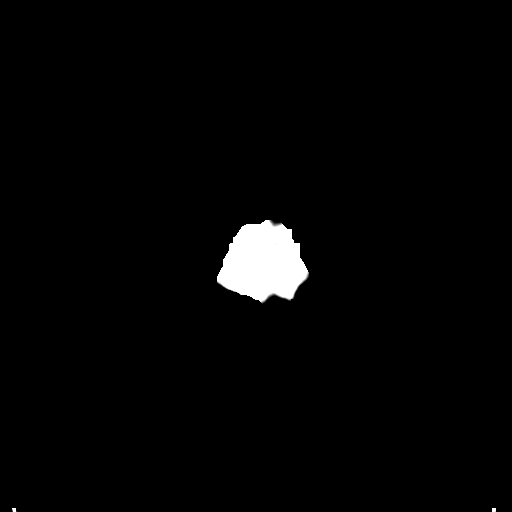
[im 28/30  bone]
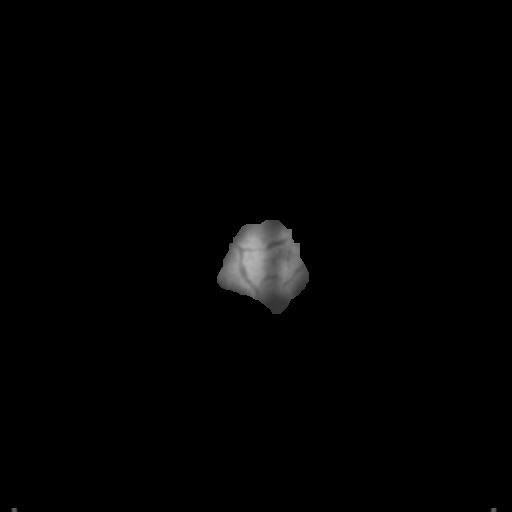

[Series 4: coronal soft tissue · coronal · 0.30mm/px · 3 of 58 slices shown]
[im 20/58  brain]
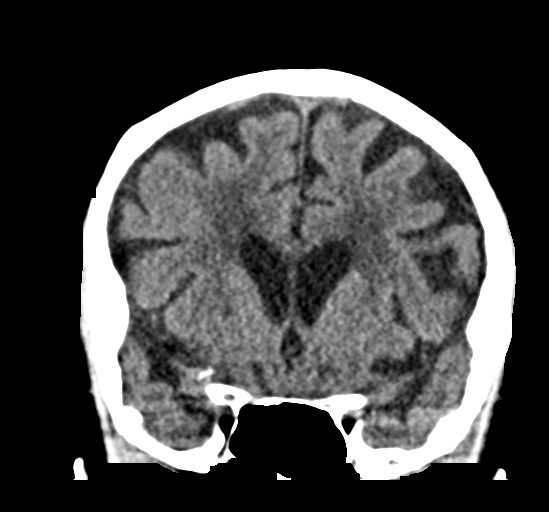
[im 26/58  brain]
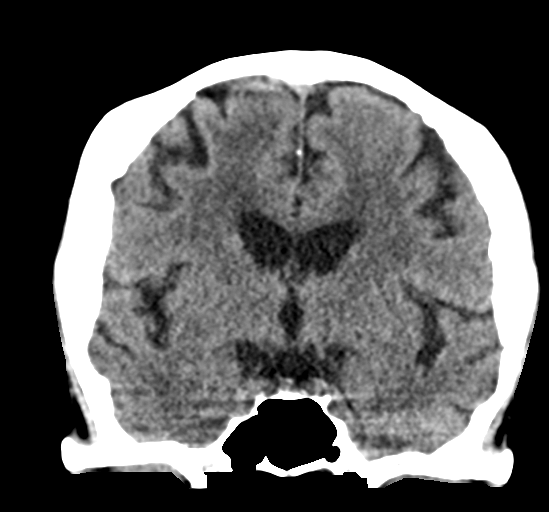
[im 32/58  brain]
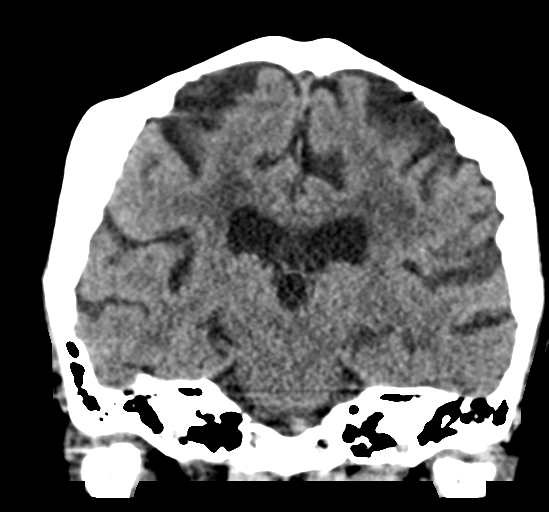

[Series 5: sagittal soft tissue · sagittal · 0.32mm/px · 3 of 49 slices shown]
[im 17/49  brain]
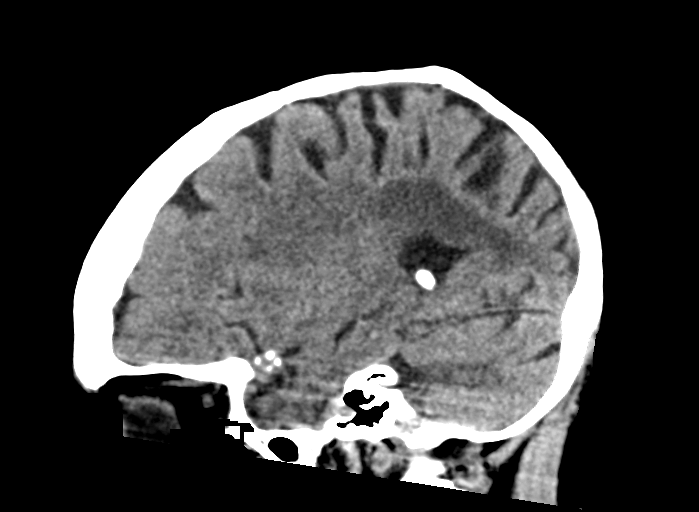
[im 25/49  brain]
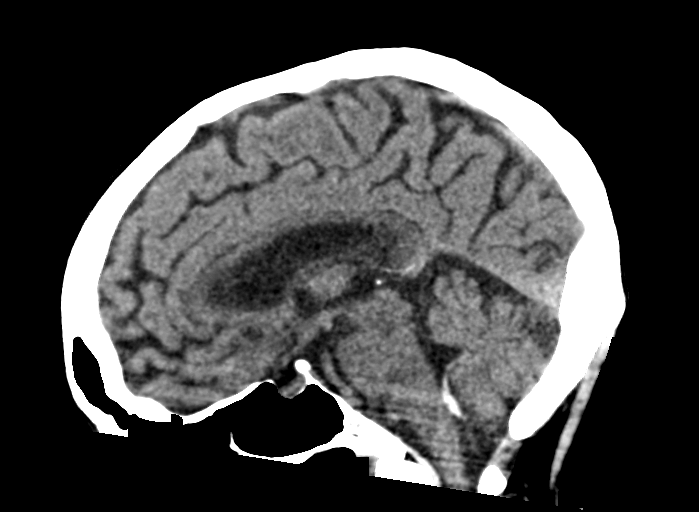
[im 33/49  brain]
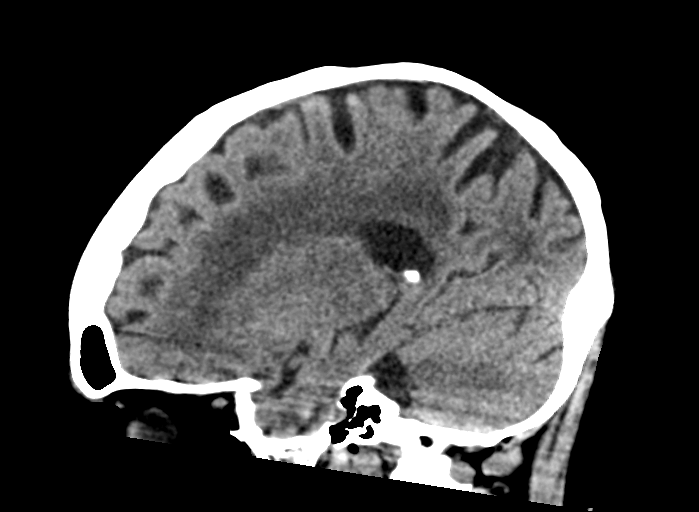

[15 of 47 positions shown; findings below may reference images not displayed]

FINDINGS: Brain: No evidence of acute infarction, hemorrhage, hydrocephalus,
extra-axial collection or mass lesion/mass effect.

Subcortical white matter and periventricular small vessel ischemic
changes.

Vascular: Intracranial atherosclerosis.

Skull: Normal. Negative for fracture or focal lesion.

Sinuses/Orbits: The visualized paranasal sinuses are essentially
clear. The mastoid air cells are unopacified.

Other: None.
IMPRESSION: No evidence of acute intracranial abnormality.

Small vessel ischemic changes.

## 2017-12-07 IMAGING — MR MR HEAD W/O CM
10 series · 48 of 48 positions shown · non-contrast
Comparison: Head CT 03/28/2017

Brain MRI 06/09/2016

CLINICAL DATA: Dysphagia

EXAM:
MRI HEAD WITHOUT CONTRAST
TECHNIQUE: Multiplanar, multiecho pulse sequences of the brain and surrounding
structures were obtained without intravenous contrast.

[Series 3: T1 · sagittal · 5.0mm · 0.45mm/px · 4 of 29 slices shown (1 of 2)]
[im 1/29]
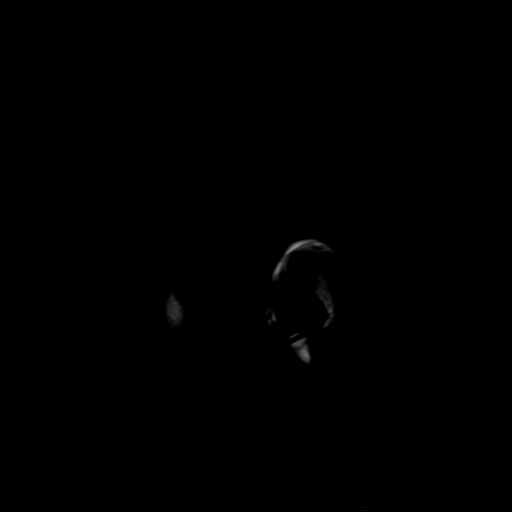
[im 10/29]
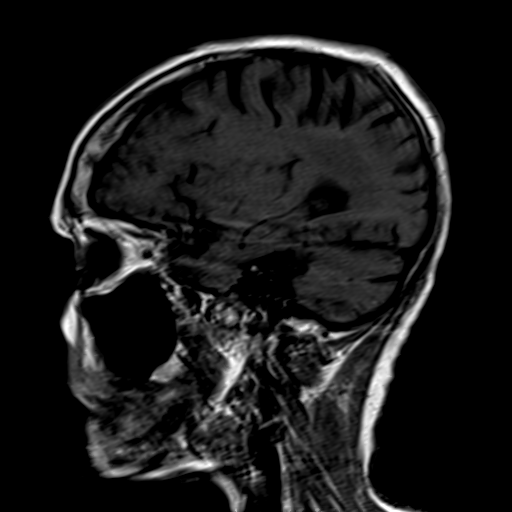
[im 19/29]
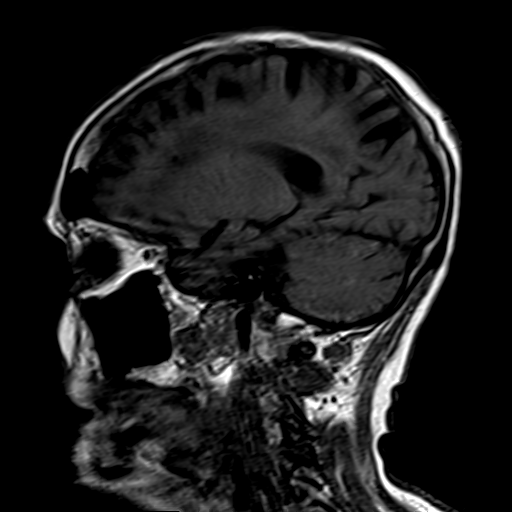
[im 29/29]
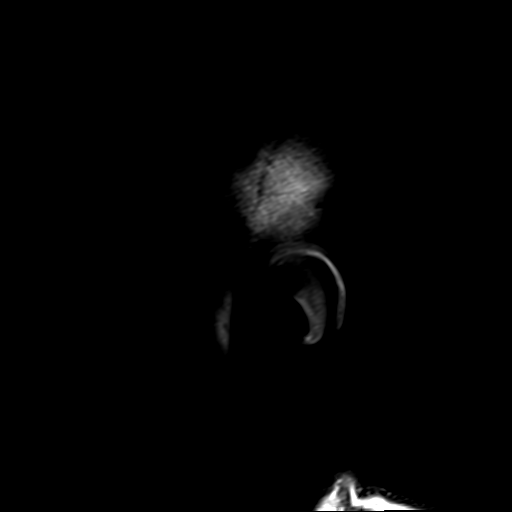

[Series 5: DWI · axial · 3.0mm · 1.80mm/px · z∈[-96,+66]mm · 6 of 54 slices shown (1 of 2)]
[im 1/54]
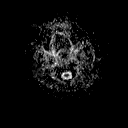
[im 11/54]
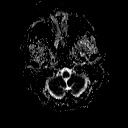
[im 22/54]
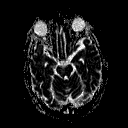
[im 32/54]
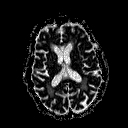
[im 43/54]
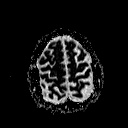
[im 54/54]
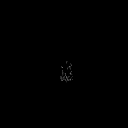

[Series 7: DWI · coronal · 3.0mm · 1.80mm/px · 5 of 46 slices shown (2 of 2)]
[im 1/46]
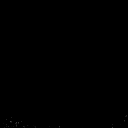
[im 12/46]
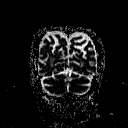
[im 23/46]
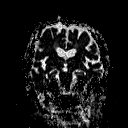
[im 34/46]
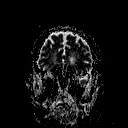
[im 46/46]
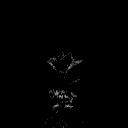

[Series 8: T2 · axial · 5.0mm · 0.90mm/px · z∈[-105,+64]mm · 3 of 27 slices shown (1 of 3)]
[im 1/27]
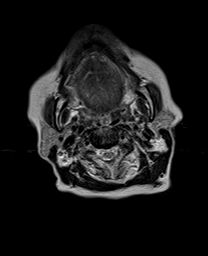
[im 14/27]
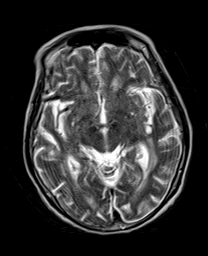
[im 27/27]
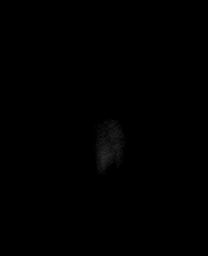

[Series 9: FLAIR · axial · 5.0mm · 0.45mm/px · z∈[-105,+58]mm · 3 of 26 slices shown]
[im 1/26]
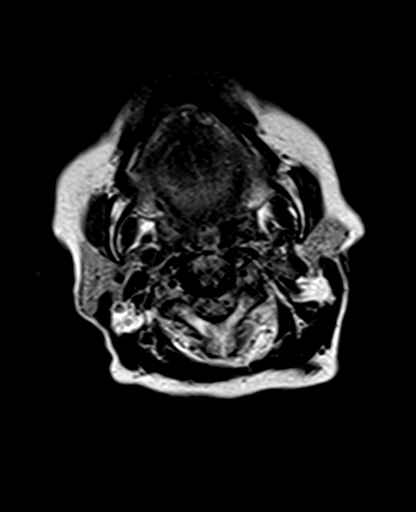
[im 13/26]
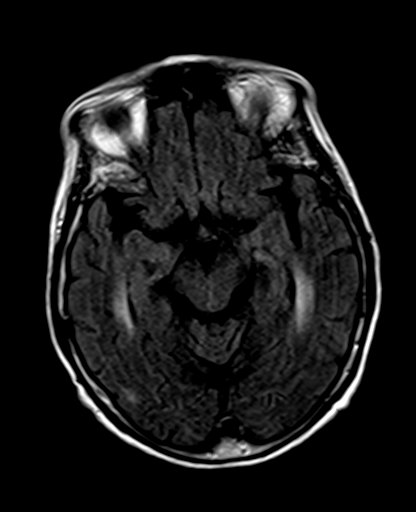
[im 26/26]
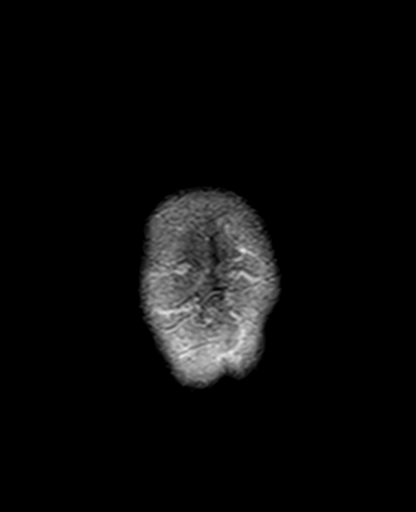

[Series 10: T2 · axial · 5.0mm · 0.45mm/px · z∈[-105,+64]mm · 3 of 27 slices shown (2 of 3)]
[im 1/27]
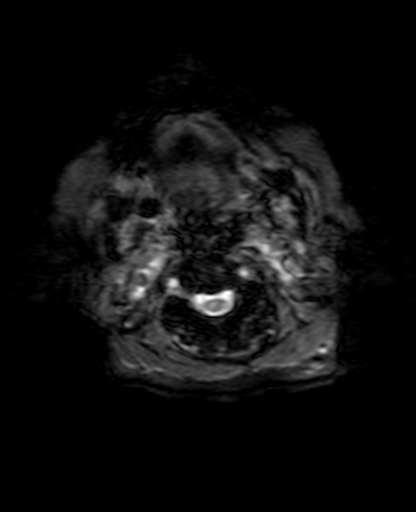
[im 14/27]
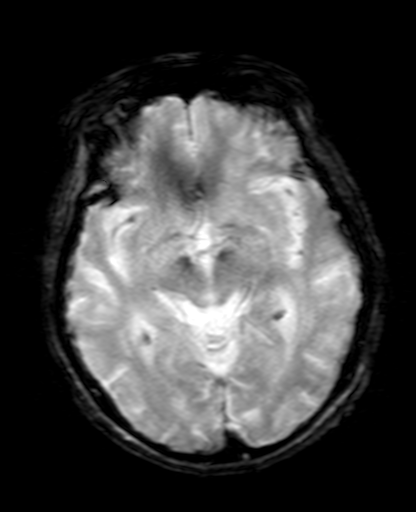
[im 27/27]
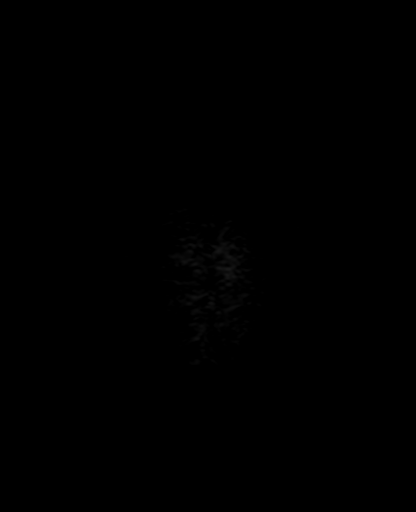

[Series 11: T1 · axial · 3.0mm · 1.00mm/px · z∈[-109,+68]mm · 7 of 60 slices shown (2 of 2)]
[im 1/60]
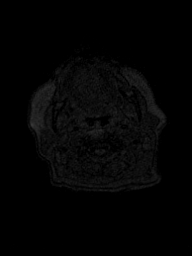
[im 10/60]
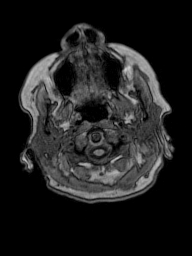
[im 20/60]
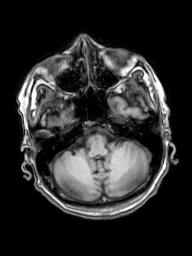
[im 30/60]
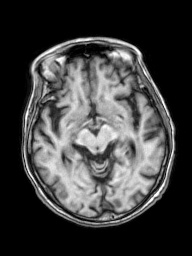
[im 40/60]
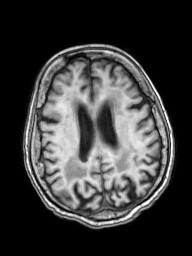
[im 50/60]
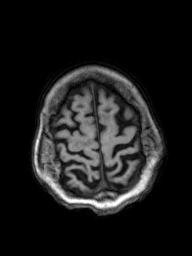
[im 60/60]
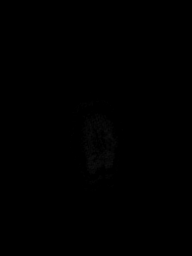

[Series 12: T2 · coronal · 5.0mm · 0.86mm/px · 4 of 31 slices shown (3 of 3)]
[im 1/31]
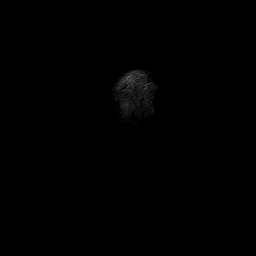
[im 11/31]
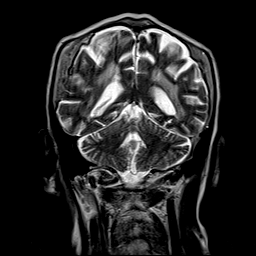
[im 21/31]
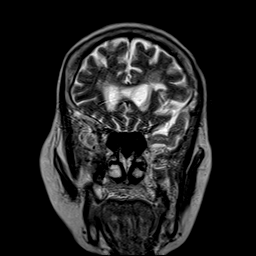
[im 31/31]
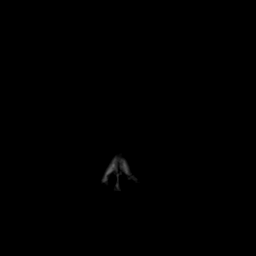

[Series 100: ax (id) · axial · 3.0mm · 1.80mm/px · z∈[-96,+66]mm · 7 of 55 slices shown]
[im 1/55]
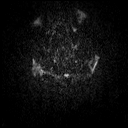
[im 10/55]
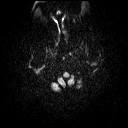
[im 19/55]
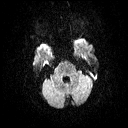
[im 28/55]
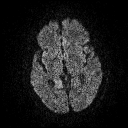
[im 37/55]
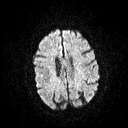
[im 46/55]
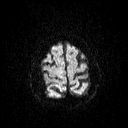
[im 55/55]
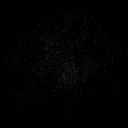

[Series 101: cor (id) · coronal · 3.0mm · 1.80mm/px · 6 of 48 slices shown]
[im 1/48]
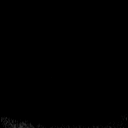
[im 10/48]
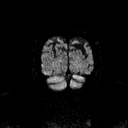
[im 19/48]
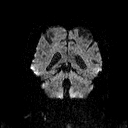
[im 29/48]
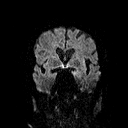
[im 38/48]
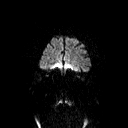
[im 48/48]
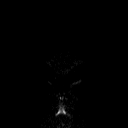

[48 of 48 positions shown; findings below may reference images not displayed]

FINDINGS: Brain: The midline structures are normal. There is no focal
diffusion restriction to indicate acute infarct. There is confluent
hyperintense T2-weighted signal within the periventricular white
matter, most often seen in the setting of chronic microvascular
ischemia. No intraparenchymal hematoma or chronic microhemorrhage.
Brain volume is normal for age without age-advanced or lobar
predominant atrophy. The dura is normal and there is no extra-axial
collection.

Vascular: There is an aneurysm of the right MCA bifurcation,
measuring approximately 7 mm, unchanged.

Skull and upper cervical spine: The visualized skull base,
calvarium, upper cervical spine and extracranial soft tissues are
normal.

Sinuses/Orbits: No fluid levels or advanced mucosal thickening. No
mastoid effusion. Normal orbits.
IMPRESSION: 1. No acute intracranial abnormality.
2. Confluent bilateral periventricular white matter disease, most
commonly indicating chronic microvascular ischemia.
3. 7 mm right MCA bifurcation aneurysm, unchanged.

## 2017-12-07 NOTE — Anesthesia Postprocedure Evaluation (Signed)
Anesthesia Post Note  Patient: [name]  Procedure(s) Performed: ESOPHAGOGASTRODUODENOSCOPY (EGD) WITH PROPOFOL (N/A )  Patient location during evaluation: PACU Anesthesia Type: General Level of consciousness: awake and alert Pain management: pain level controlled Vital Signs Assessment: post-procedure vital signs reviewed and stable Respiratory status: spontaneous breathing, nonlabored ventilation, respiratory function stable and patient connected to nasal cannula oxygen Cardiovascular status: blood pressure returned to baseline and stable Postop Assessment: no apparent nausea or vomiting Anesthetic complications: no     Last Vitals:  Vitals:   12/06/17 1437 12/06/17 1659  BP: (!) 145/56 (!) 132/54  Pulse: 68 65  Resp:    Temp: 36.6 C 37.1 C  SpO2: 100% 100%    Last Pain:  Vitals:   12/06/17 1659  TempSrc: Oral                 Molli Barrows

## 2017-12-15 ENCOUNTER — Ambulatory Visit: Payer: Medicare HMO | Admitting: Nurse Practitioner

## 2017-12-22 ENCOUNTER — Encounter: Payer: Medicare HMO | Attending: Nurse Practitioner | Admitting: Nurse Practitioner

## 2017-12-22 DIAGNOSIS — L89623 Pressure ulcer of left heel, stage 3: Secondary | ICD-10-CM | POA: Diagnosis not present

## 2017-12-22 DIAGNOSIS — D631 Anemia in chronic kidney disease: Secondary | ICD-10-CM | POA: Diagnosis not present

## 2017-12-22 DIAGNOSIS — Z993 Dependence on wheelchair: Secondary | ICD-10-CM | POA: Diagnosis not present

## 2017-12-22 DIAGNOSIS — L89613 Pressure ulcer of right heel, stage 3: Secondary | ICD-10-CM | POA: Diagnosis present

## 2017-12-22 DIAGNOSIS — I83024 Varicose veins of left lower extremity with ulcer of heel and midfoot: Secondary | ICD-10-CM | POA: Insufficient documentation

## 2017-12-22 DIAGNOSIS — I129 Hypertensive chronic kidney disease with stage 1 through stage 4 chronic kidney disease, or unspecified chronic kidney disease: Secondary | ICD-10-CM | POA: Insufficient documentation

## 2017-12-22 DIAGNOSIS — L89322 Pressure ulcer of left buttock, stage 2: Secondary | ICD-10-CM | POA: Insufficient documentation

## 2017-12-22 DIAGNOSIS — L97512 Non-pressure chronic ulcer of other part of right foot with fat layer exposed: Secondary | ICD-10-CM | POA: Diagnosis not present

## 2017-12-22 DIAGNOSIS — Z87891 Personal history of nicotine dependence: Secondary | ICD-10-CM | POA: Diagnosis not present

## 2017-12-22 DIAGNOSIS — Z96653 Presence of artificial knee joint, bilateral: Secondary | ICD-10-CM | POA: Insufficient documentation

## 2017-12-22 DIAGNOSIS — N189 Chronic kidney disease, unspecified: Secondary | ICD-10-CM | POA: Diagnosis not present

## 2017-12-22 DIAGNOSIS — Z7401 Bed confinement status: Secondary | ICD-10-CM | POA: Insufficient documentation
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
## 2017-12-23 NOTE — Progress Notes (Signed)
[name] ([id number]) Visit Report for 12/22/2017 Arrival Information Details Patient Name: [name]. Date of Service: 12/22/2017 10:30 AM Medical Record Number: [id number] Patient Account Number: [id number] Date of Birth/Sex: Dec 15, 1930 (84 y.o. Female) Treating RN: [name] Primary Care [name]: [name] Other Clinician: Referring [name]: [name] Treating [name]: [name] in Treatment: 41 Visit Information History Since Last Visit Added or deleted any medications: No Patient Arrived: Wheel Chair Any new allergies or adverse reactions: No Arrival Time: 10:45 Had a fall or experienced change in No activities of daily living that may affect Accompanied By: self risk of falls: Transfer Assistance: Hoyer Lift Signs or symptoms of abuse/neglect since last visito No Patient Identification Verified: Yes Hospitalized since last visit: No Secondary Verification Process Completed: Yes Has Dressing in Place as Prescribed: Yes Patient Requires Transmission-Based No Pain Present Now: No Precautions: Patient Has Alerts: No Electronic Signature(s) Signed: 12/22/2017 5:02:17 PM By: [name] Entered By: [name] on 12/22/2017 10:49:18 [name] ([id number]) -------------------------------------------------------------------------------- Clinic Level of Care Assessment Details Patient Name: [name] Date of Service: 12/22/2017 10:30 AM Medical Record Number: [id number] Patient Account Number: [id number] Date of Birth/Sex: 31-May-1931 (84 y.o. Female) Treating RN: [name] Primary Care [name]: [name] Other Clinician: Referring Tamaka Sawin: [name] Treating [name]: [name] in Treatment: 62 Clinic Level of Care Assessment Items TOOL 4 Quantity Score X - Use when only an EandM is performed on FOLLOW-UP visit 1 0 ASSESSMENTS - Nursing Assessment / Reassessment X -  Reassessment of Co-morbidities (includes updates in patient status) 1 10 X- 1 5 Reassessment of Adherence to Treatment Plan ASSESSMENTS - Wound and Skin Assessment / Reassessment X - Simple Wound Assessment / Reassessment - one wound 1 5 []  - 0 Complex Wound Assessment / Reassessment - multiple wounds []  - 0 Dermatologic / Skin Assessment (not related to wound area) ASSESSMENTS - Focused Assessment []  - Circumferential Edema Measurements - multi extremities 0 []  - 0 Nutritional Assessment / Counseling / Intervention []  - 0 Lower Extremity Assessment (monofilament, tuning fork, pulses) []  - 0 Peripheral Arterial Disease Assessment (using hand held doppler) ASSESSMENTS - Ostomy and/or Continence Assessment and Care []  - Incontinence Assessment and Management 0 []  - 0 Ostomy Care Assessment and Management (repouching, etc.) PROCESS - Coordination of Care []  - Simple Patient / Family Education for ongoing care 0 X- 1 20 Complex (extensive) Patient / Family Education for ongoing care X- 1 10 Staff obtains Programmer, systems, Records, Test Results / Process Orders X- 1 10 Staff telephones HHA, Nursing Homes / Clarify orders / etc []  - 0 Routine Transfer to another Facility (non-emergent condition) []  - 0 Routine Hospital Admission (non-emergent condition) []  - 0 New Admissions / Biomedical engineer / Ordering NPWT, Apligraf, etc. []  - 0 Emergency Hospital Admission (emergent condition) X- 1 10 Simple Discharge Coordination [name], [name] ([id number]) []  - 0 Complex (extensive) Discharge Coordination PROCESS - Special Needs []  - Pediatric / Minor Patient Management 0 []  - 0 Isolation Patient Management []  - 0 Hearing / Language / Visual special needs []  - 0 Assessment of Community assistance (transportation, D/C planning, etc.) []  - 0 Additional assistance / Altered mentation []  - 0 Support Surface(s) Assessment (bed, cushion, seat, etc.) INTERVENTIONS - Wound Cleansing /  Measurement X - Simple Wound Cleansing - one wound 1 5 []  - 0 Complex Wound Cleansing - multiple wounds X- 1 5 Wound Imaging (photographs - any number of wounds) []  - 0 Wound  Tracing (instead of photographs) X- 1 5 Simple Wound Measurement - one wound []  - 0 Complex Wound Measurement - multiple wounds INTERVENTIONS - Wound Dressings X - Small Wound Dressing one or multiple wounds 1 10 []  - 0 Medium Wound Dressing one or multiple wounds []  - 0 Large Wound Dressing one or multiple wounds X- 1 5 Application of Medications - topical []  - 0 Application of Medications - injection INTERVENTIONS - Miscellaneous []  - External ear exam 0 []  - 0 Specimen Collection (cultures, biopsies, blood, body fluids, etc.) []  - 0 Specimen(s) / Culture(s) sent or taken to Lab for analysis X- 1 10 Patient Transfer (multiple staff / Civil Service fast streamer / Similar devices) []  - 0 Simple Staple / Suture removal (25 or less) []  - 0 Complex Staple / Suture removal (26 or more) []  - 0 Hypo / Hyperglycemic Management (close monitor of Blood Glucose) []  - 0 Ankle / Brachial Index (ABI) - do not check if billed separately X- 1 5 Vital Signs Zuniga, Danielle S. ([id number]) Has the patient been seen at the hospital within the last three years: Yes Total Score: 115 Level Of Care: New/Established - Level 3 Electronic Signature(s) Signed: 12/23/2017 4:41:33 PM By: [name] Entered By: [name] on 12/23/2017 14:29:11 [name] ([id number]) -------------------------------------------------------------------------------- Encounter Discharge Information Details Patient Name: [name]. Date of Service: 12/22/2017 10:30 AM Medical Record Number: [id number] Patient Account Number: [id number] Date of Birth/Sex: 11-15-1930 (84 y.o. Female) Treating RN: [name] Primary Care Jermia Rigsby: [name] Other Clinician: Referring Oleva Koo: [name] Treating Nicolena Schurman/Extender: [name] in Treatment: 4 Encounter Discharge Information Items Discharge Pain Level: 0 Discharge Condition: Stable Ambulatory Status: Wheelchair Discharge Destination: Home Transportation: Private Auto Accompanied By: driver Schedule Follow-up Appointment: Yes Medication Reconciliation completed and No provided to Patient/Care Siddharth Babington: Provided on Clinical Summary of Care: 12/22/2017 Form Type Recipient Paper Patient EB Electronic Signature(s) Signed: 12/22/2017 4:51:26 PM By: Roger Shelter Entered By: Roger Shelter on 12/22/2017 11:40:33 [name] ([id number]) -------------------------------------------------------------------------------- Lower Extremity Assessment Details Patient Name: [name]. Date of Service: 12/22/2017 10:30 AM Medical Record Number: [id number] Patient Account Number: [id number] Date of Birth/Sex: 1931/03/16 (84 y.o. Female) Treating RN: [name] Primary Care Olanda Boughner: [name] Other Clinician: Referring Davit Vassar: [name] Treating Nashid Pellum/Extender: [name] in Treatment: 17 Vascular Assessment Pulses: Dorsalis Pedis Palpable: [Left:Yes] [Right:Yes] Posterior Tibial Extremity colors, hair growth, and conditions: Extremity Color: [Left:Normal] [Right:Normal] Hair Growth on Extremity: [Left:No] [Right:No] Temperature of Extremity: [Left:Warm] [Right:Warm] Capillary Refill: [Left:< 3 seconds] [Right:< 3 seconds] Toe Nail Assessment Left: Right: Thick: Yes Yes Discolored: Yes Yes Deformed: Yes Yes Improper Length and Hygiene: No No Electronic Signature(s) Signed: 12/22/2017 5:02:17 PM By: [name] Entered By: [name] on 12/22/2017 11:03:07 [name] ([id number]) -------------------------------------------------------------------------------- Multi Wound Chart Details Patient Name: [name]. Date of Service: 12/22/2017 10:30 AM Medical Record Number: [id number] Patient  Account Number: [id number] Date of Birth/Sex: 11/12/1930 (84 y.o. Female) Treating RN: [name] Primary Care Phillip Maffei: [name] Other Clinician: Referring Adiva Boettner: [name] Treating Wylie Coon/Extender: [name] in Treatment: 78 Vital Signs Height(in): 58 Pulse(bpm): 54 Weight(lbs): 160 Blood Pressure(mmHg): 123/51 Body Mass Index(BMI): 28 Temperature(F): 98.2 Respiratory Rate 16 (breaths/min): Photos: Wound Location: Left Calcaneus - Medial Right Calcaneus Right, Plantar Foot Wounding Event: Pressure Injury Pressure Injury Pressure Injury Primary Etiology: Pressure Ulcer Pressure Ulcer Pressure Ulcer Comorbid History: Cataracts, Hypertension, Cataracts, Hypertension, N/A Peripheral Venous Disease Peripheral Venous Disease Date Acquired: 07/02/2016 09/15/2017 10/27/2017  Weeks of Treatment: 67 12 8 Wound Status: Healed - Epithelialized Open Healed - Epithelialized Measurements L x W x D 0x0x0 0.1x0.1x0.1 0x0x0 (cm) Area (cm) : 0 0.008 0 Volume (cm) : 0 0.001 0 % Reduction in Area: 100.00% 99.70% 100.00% % Reduction in Volume: 100.00% 99.80% 100.00% Classification: Category/Stage III Category/Stage III Category/Stage II Exudate Amount: None Present None Present N/A Wound Margin: Flat and Intact Flat and Intact N/A Granulation Amount: None Present (0%) None Present (0%) N/A Necrotic Amount: None Present (0%) None Present (0%) N/A Exposed Structures: Fat Layer (Subcutaneous Fat Layer (Subcutaneous N/A Tissue) Exposed: Yes Tissue) Exposed: Yes Fascia: No Tendon: No Muscle: No Joint: No Bone: No Epithelialization: Large (67-100%) Large (67-100%) N/A Debridement: N/A Debridement ([id number]) N/A N/A 11:12 N/A [name], [name] ([id number]) Pre-procedure Verification/Time Out Taken: Pain Control: N/A Lidocaine 4% Topical Solution N/A Tissue Debrided: N/A Fibrin/Slough, Exudates, N/A Subcutaneous Level: N/A Skin/Subcutaneous Tissue  N/A Debridement Area (sq cm): N/A 0.01 N/A Instrument: N/A Curette N/A Bleeding: N/A Minimum N/A Hemostasis Achieved: N/A Pressure N/A Procedural Pain: N/A 0 N/A Post Procedural Pain: N/A 0 N/A Debridement Treatment N/A Procedure was tolerated well N/A Response: Post Debridement N/A 0.1x0.1x0.1 N/A Measurements L x W x D (cm) Post Debridement Volume: N/A 0.001 N/A (cm) Post Debridement Stage: N/A Category/Stage III N/A Periwound Skin Texture: Callus: Yes Induration: Yes No Abnormalities Noted Scarring: Yes Callus: Yes Excoriation: No Scarring: Yes Induration: No Excoriation: No Crepitus: No Crepitus: No Rash: No Rash: No Periwound Skin Moisture: Dry/Scaly: Yes Dry/Scaly: Yes No Abnormalities Noted Maceration: No Maceration: No Periwound Skin Color: Atrophie Blanche: No Atrophie Blanche: No No Abnormalities Noted Cyanosis: No Cyanosis: No Ecchymosis: No Ecchymosis: No Erythema: No Erythema: No Hemosiderin Staining: No Hemosiderin Staining: No Mottled: No Mottled: No Pallor: No Pallor: No Rubor: No Rubor: No Temperature: No Abnormality No Abnormality N/A Tenderness on Palpation: Yes No No Wound Preparation: Ulcer Cleansing: Ulcer Cleansing: N/A Rinsed/Irrigated with Saline Rinsed/Irrigated with Saline Topical Anesthetic Applied: Topical Anesthetic Applied: None Other: lidocaine 4% Procedures Performed: N/A Debridement N/A Treatment Notes Wound #8 (Right Calcaneus) 1. Cleansed with: Clean wound with Normal Saline 2. Anesthetic Topical Lidocaine 4% cream to wound bed prior to debridement 5. Secondary Dressing Applied Bordered Foam Dressing Electronic Signature(s) [name], [name] ([id number]) Signed: 12/22/2017 11:56:34 AM By: Lawanda Cousins Entered By: Lawanda Cousins on 12/22/2017 11:56:34 [name] ([id number]) -------------------------------------------------------------------------------- [place] Details Patient Name:  [name] Date of Service: 12/22/2017 10:30 AM Medical Record Number: [id number] Patient Account Number: [id number] Date of Birth/Sex: 01-23-1931 (84 y.o. Female) Treating RN: [name] Primary Care Loye Reininger: [name] Other Clinician: Referring Nimsi Males: [name] Treating Avir Deruiter/Extender: [name] in Treatment: 26 Active Inactive ` Abuse / Safety / Falls / Self Care Management Nursing Diagnoses: Impaired physical mobility Potential for falls Goals: Patient will remain injury free Date Initiated: 09/06/2016 Target Resolution Date: 12/02/2016 Goal Status: Active Interventions: Assess fall risk on admission and as needed Assess self care needs on admission and as needed Notes: ` Necrotic Tissue Nursing Diagnoses: Impaired tissue integrity related to necrotic/devitalized tissue Goals: Necrotic/devitalized tissue will be minimized in the wound bed Date Initiated: 09/06/2016 Target Resolution Date: 12/02/2016 Goal Status: Active Interventions: Assess patient pain level pre-, during and post procedure and prior to discharge Notes: ` Orientation to the Wound Care Program Nursing Diagnoses: Knowledge deficit related to the wound healing center program Goals: Patient/caregiver will verbalize understanding of the [place] Date Initiated: 09/06/2016 Target Resolution  Date: 12/02/2016 Goal Status: Active [name], [name] ([id number]) Interventions: Provide education on orientation to the wound center Notes: ` Pressure Nursing Diagnoses: Knowledge deficit related to management of pressures ulcers Potential for impaired tissue integrity related to pressure, friction, moisture, and shear Goals: Patient will remain free of pressure ulcers Date Initiated: 09/06/2016 Target Resolution Date: 12/02/2016 Goal Status: Active Interventions: Assess: immobility, friction, shearing, incontinence upon admission and as  needed Notes: ` Soft Tissue Infection Nursing Diagnoses: Impaired tissue integrity Potential for infection: soft tissue Goals: Patient will remain free of wound infection Date Initiated: 09/06/2016 Target Resolution Date: 12/02/2016 Goal Status: Active Interventions: Assess signs and symptoms of infection every visit Notes: ` Wound/Skin Impairment Nursing Diagnoses: Impaired tissue integrity Goals: Ulcer/skin breakdown will heal within 14 weeks Date Initiated: 09/06/2016 Target Resolution Date: 12/16/2016 Goal Status: Active Interventions: Assess ulceration(s) every visit Notes: [name], [name] ([id number]) Electronic Signature(s) Signed: 12/22/2017 4:33:49 PM By: [name] Entered By: [name] on 12/22/2017 11:11:31 [name] ([id number]) -------------------------------------------------------------------------------- Pain Assessment Details Patient Name: [name]. Date of Service: 12/22/2017 10:30 AM Medical Record Number: [id number] Patient Account Number: [id number] Date of Birth/Sex: January 03, 1931 (84 y.o. Female) Treating RN: [name] Primary Care Yer Castello: [name] Other Clinician: Referring Hassan Blackshire: [name] Treating Betina Puckett/Extender: [name] in Treatment: 75 Active Problems Location of Pain Severity and Description of Pain Patient Has Paino No Site Locations Pain Management and Medication Current Pain Management: Electronic Signature(s) Signed: 12/22/2017 5:02:17 PM By: [name] Entered By: [name] on 12/22/2017 10:49:27 [name] ([id number]) -------------------------------------------------------------------------------- Patient/Caregiver Education Details Patient Name: [name]. Date of Service: 12/22/2017 10:30 AM Medical Record Number: [id number] Patient Account Number: [id number] Date of Birth/Gender: 07-06-1931 (84 y.o. Female) Treating RN: Roger Shelter Primary Care  Physician: [name] Other Clinician: Referring Physician: Josephine Zuniga Treating Physician/Extender: [name] in Treatment: 41 Education Assessment Education Provided To: Patient Education Topics Provided Wound/Skin Impairment: Handouts: Skin Care Do's and Dont's Methods: Explain/Verbal Responses: State content correctly Electronic Signature(s) Signed: 12/22/2017 4:51:26 PM By: Roger Shelter Entered By: Roger Shelter on 12/22/2017 11:58:25 [name] ([id number]) -------------------------------------------------------------------------------- Wound Assessment Details Patient Name: [name]. Date of Service: 12/22/2017 10:30 AM Medical Record Number: [id number] Patient Account Number: [id number] Date of Birth/Sex: 1930/12/24 (84 y.o. Female) Treating RN: [name] Primary Care Kao Conry: [name] Other Clinician: Referring Latravis Grine: [name] Treating Evonna Stoltz/Extender: [name] in Treatment: 67 Wound Status Wound Number: 2 Primary Pressure Ulcer Etiology: Wound Location: Left Calcaneus - Medial Wound Status: Healed - Epithelialized Wounding Event: Pressure Injury Comorbid Cataracts, Hypertension, Peripheral Venous Date Acquired: 07/02/2016 History: Disease Weeks Of Treatment: 67 Clustered Wound: No Photos Photo Uploaded By: [name] on 12/22/2017 11:43:44 Wound Measurements Length: (cm) 0 % Re Width: (cm) 0 % Re Depth: (cm) 0 Epit Area: (cm) 0 Tun Volume: (cm) 0 Und duction in Area: 100% duction in Volume: 100% helialization: Large (67-100%) neling: No ermining: No Wound Description Classification: Category/Stage III Wound Margin: Flat and Intact Exudate Amount: None Present Foul Odor After Cleansing: No Slough/Fibrino No Wound Bed Granulation Amount: None Present (0%) Exposed Structure Necrotic Amount: None Present (0%) Fascia Exposed: No Fat Layer (Subcutaneous Tissue) Exposed:  Yes Tendon Exposed: No Muscle Exposed: No Joint Exposed: No Bone Exposed: No Periwound Skin Texture Texture Color No Abnormalities Noted: No No Abnormalities Noted: No [place], Danielle S. ([id number]) Callus: Yes Atrophie Blanche: No Crepitus: No Cyanosis: No Excoriation: No Ecchymosis: No Induration: No Erythema: No Rash: No  Hemosiderin Staining: No Scarring: Yes Mottled: No Pallor: No Moisture Rubor: No No Abnormalities Noted: No Dry / Scaly: Yes Temperature / Pain Maceration: No Temperature: No Abnormality Tenderness on Palpation: Yes Wound Preparation Ulcer Cleansing: Rinsed/Irrigated with Saline Topical Anesthetic Applied: None Electronic Signature(s) Signed: 12/22/2017 4:33:49 PM By: [name] Entered By: [name] on 12/22/2017 11:19:28 [name] ([id number]) -------------------------------------------------------------------------------- Wound Assessment Details Patient Name: [name]. Date of Service: 12/22/2017 10:30 AM Medical Record Number: [id number] Patient Account Number: [id number] Date of Birth/Sex: 07-24-1931 (84 y.o. Female) Treating RN: [name] Primary Care Gabriana Wilmott: [name] Other Clinician: Referring Dymon Summerhill: [name] Treating Benicia Bergevin/Extender: [name] in Treatment: 67 Wound Status Wound Number: 8 Primary Pressure Ulcer Etiology: Wound Location: Right Calcaneus Wound Status: Open Wounding Event: Pressure Injury Comorbid Cataracts, Hypertension, Peripheral Venous Date Acquired: 09/15/2017 History: Disease Weeks Of Treatment: 12 Clustered Wound: No Photos Photo Uploaded By: [name] on 12/22/2017 11:43:45 Wound Measurements Length: (cm) 0.1 Width: (cm) 0.1 Depth: (cm) 0.1 Area: (cm) 0.008 Volume: (cm) 0.001 % Reduction in Area: 99.7% % Reduction in Volume: 99.8% Epithelialization: Large (67-100%) Tunneling: No Undermining: No Wound Description Classification:  Category/Stage III Foul O Wound Margin: Flat and Intact Slough Exudate Amount: None Present dor After Cleansing: No /Fibrino Yes Wound Bed Granulation Amount: None Present (0%) Exposed Structure Necrotic Amount: None Present (0%) Fat Layer (Subcutaneous Tissue) Exposed: Yes Periwound Skin Texture Texture Color No Abnormalities Noted: No No Abnormalities Noted: No Callus: Yes Atrophie Blanche: No Crepitus: No Cyanosis: No Excoriation: No Ecchymosis: No Induration: Yes Erythema: No Rash: No Hemosiderin Staining: No [name], MELLO. ([id number]) Scarring: Yes Mottled: No Pallor: No Moisture Rubor: No No Abnormalities Noted: No Dry / Scaly: Yes Temperature / Pain Maceration: No Temperature: No Abnormality Wound Preparation Ulcer Cleansing: Rinsed/Irrigated with Saline Topical Anesthetic Applied: Other: lidocaine 4%, Treatment Notes Wound #8 (Right Calcaneus) 1. Cleansed with: Clean wound with Normal Saline 2. Anesthetic Topical Lidocaine 4% cream to wound bed prior to debridement 5. Secondary Dressing Applied Bordered Foam Dressing Electronic Signature(s) Signed: 12/22/2017 5:02:17 PM By: [name] Entered By: [name] on 12/22/2017 11:02:37 [name] ([id number]) -------------------------------------------------------------------------------- Wound Assessment Details Patient Name: [name]. Date of Service: 12/22/2017 10:30 AM Medical Record Number: [id number] Patient Account Number: [id number] Date of Birth/Sex: 1931-04-24 (84 y.o. Female) Treating RN: [name] Primary Care Taline Nass: [name] Other Clinician: Referring Aniqa Hare: [name] Treating Arlenis Blaydes/Extender: [name] in Treatment: 67 Wound Status Wound Number: 9 Primary Etiology: Pressure Ulcer Wound Location: Right, Plantar Foot Wound Status: Healed - Epithelialized Wounding Event: Pressure Injury Date Acquired: 10/27/2017 Weeks Of Treatment:  8 Clustered Wound: No Photos Photo Uploaded By: [name] on 12/22/2017 11:44:04 Wound Measurements Length: (cm) 0 Width: (cm) 0 Depth: (cm) 0 Area: (cm) 0 Volume: (cm) 0 % Reduction in Area: 100% % Reduction in Volume: 100% Wound Description Classification: Category/Stage II Periwound Skin Texture Texture Color No Abnormalities Noted: No No Abnormalities Noted: No Moisture No Abnormalities Noted: No Electronic Signature(s) Signed: 12/22/2017 5:02:17 PM By: [name] Entered By: [name] on 12/22/2017 11:01:31 [name] ([id number]) -------------------------------------------------------------------------------- Vit[place] Details Patient Name: [name]. Date of Service: 12/22/2017 10:30 AM Medical Record Number: [id number] Patient Account Number: [id number] Date of Birth/Sex: 07/11/1931 (84 y.o. Female) Treating RN: [name] Primary Care Mitali Shenefield: [name] Other Clinician: Referring Jameshia Hayashida: [name] Treating Glynda Soliday/Extender: [name] in Treatment: 77 Vital Signs Time Taken: 10:49 Temperature (F): 98.2 Height (in): 63 Pulse (  bpm): 63 Weight (lbs): 160 Respiratory Rate (breaths/min): 16 Body Mass Index (BMI): 28.3 Blood Pressure (mmHg): 123/51 Reference Range: 80 - 120 mg / dl Electronic Signature(s) Signed: 12/22/2017 5:02:17 PM By: [name] Entered By: [name] on 12/22/2017 10:50:12

## 2017-12-24 ENCOUNTER — Inpatient Hospital Stay
Admission: EM | Admit: 2017-12-24 | Discharge: 2017-12-29 | DRG: 689 | Disposition: A | Discharge status: Skilled Nursing Facility | Payer: Medicare HMO | Attending: Internal Medicine | Admitting: Internal Medicine

## 2017-12-24 ENCOUNTER — Other Ambulatory Visit: Payer: Self-pay

## 2017-12-24 DIAGNOSIS — N179 Acute kidney failure, unspecified: Secondary | ICD-10-CM | POA: Diagnosis present

## 2017-12-24 DIAGNOSIS — Z79899 Other long term (current) drug therapy: Secondary | ICD-10-CM

## 2017-12-24 DIAGNOSIS — I5032 Chronic diastolic (congestive) heart failure: Secondary | ICD-10-CM | POA: Diagnosis present

## 2017-12-24 DIAGNOSIS — D649 Anemia, unspecified: Secondary | ICD-10-CM | POA: Diagnosis present

## 2017-12-24 DIAGNOSIS — G9341 Metabolic encephalopathy: Secondary | ICD-10-CM | POA: Diagnosis present

## 2017-12-24 DIAGNOSIS — M810 Age-related osteoporosis without current pathological fracture: Secondary | ICD-10-CM | POA: Diagnosis present

## 2017-12-24 DIAGNOSIS — N390 Urinary tract infection, site not specified: Secondary | ICD-10-CM | POA: Diagnosis present

## 2017-12-24 DIAGNOSIS — B965 Pseudomonas (aeruginosa) (mallei) (pseudomallei) as the cause of diseases classified elsewhere: Secondary | ICD-10-CM | POA: Diagnosis present

## 2017-12-24 DIAGNOSIS — H6990 Unspecified Eustachian tube disorder, unspecified ear: Secondary | ICD-10-CM | POA: Diagnosis present

## 2017-12-24 DIAGNOSIS — A0472 Enterocolitis due to Clostridium difficile, not specified as recurrent: Secondary | ICD-10-CM | POA: Diagnosis present

## 2017-12-24 DIAGNOSIS — Z90722 Acquired absence of ovaries, bilateral: Secondary | ICD-10-CM

## 2017-12-24 DIAGNOSIS — M5136 Other intervertebral disc degeneration, lumbar region: Secondary | ICD-10-CM | POA: Diagnosis present

## 2017-12-24 DIAGNOSIS — H547 Unspecified visual loss: Secondary | ICD-10-CM | POA: Diagnosis present

## 2017-12-24 DIAGNOSIS — Z87891 Personal history of nicotine dependence: Secondary | ICD-10-CM | POA: Diagnosis not present

## 2017-12-24 DIAGNOSIS — Z96653 Presence of artificial knee joint, bilateral: Secondary | ICD-10-CM | POA: Diagnosis present

## 2017-12-24 DIAGNOSIS — L89622 Pressure ulcer of left heel, stage 2: Secondary | ICD-10-CM | POA: Diagnosis present

## 2017-12-24 DIAGNOSIS — R509 Fever, unspecified: Secondary | ICD-10-CM

## 2017-12-24 DIAGNOSIS — E876 Hypokalemia: Secondary | ICD-10-CM | POA: Diagnosis present

## 2017-12-24 DIAGNOSIS — Z66 Do not resuscitate: Secondary | ICD-10-CM | POA: Diagnosis present

## 2017-12-24 DIAGNOSIS — K219 Gastro-esophageal reflux disease without esophagitis: Secondary | ICD-10-CM | POA: Diagnosis present

## 2017-12-24 DIAGNOSIS — L89612 Pressure ulcer of right heel, stage 2: Secondary | ICD-10-CM | POA: Diagnosis present

## 2017-12-24 DIAGNOSIS — N183 Chronic kidney disease, stage 3 (moderate): Secondary | ICD-10-CM | POA: Diagnosis present

## 2017-12-24 DIAGNOSIS — I130 Hypertensive heart and chronic kidney disease with heart failure and stage 1 through stage 4 chronic kidney disease, or unspecified chronic kidney disease: Secondary | ICD-10-CM | POA: Diagnosis present

## 2017-12-24 DIAGNOSIS — G9340 Encephalopathy, unspecified: Secondary | ICD-10-CM | POA: Diagnosis present

## 2017-12-24 DIAGNOSIS — F0280 Dementia in other diseases classified elsewhere without behavioral disturbance: Secondary | ICD-10-CM | POA: Diagnosis present

## 2017-12-24 DIAGNOSIS — Z87440 Personal history of urinary (tract) infections: Secondary | ICD-10-CM

## 2017-12-24 DIAGNOSIS — R410 Disorientation, unspecified: Secondary | ICD-10-CM

## 2017-12-24 DIAGNOSIS — Z90710 Acquired absence of both cervix and uterus: Secondary | ICD-10-CM | POA: Diagnosis not present

## 2017-12-24 LAB — URINALYSIS, ROUTINE W REFLEX MICROSCOPIC
BILIRUBIN URINE: NEGATIVE
Glucose, UA: NEGATIVE mg/dL
Ketones, ur: NEGATIVE mg/dL
Nitrite: POSITIVE — AB
PROTEIN: 30 mg/dL — AB
SPECIFIC GRAVITY, URINE: 1.009 (ref 1.005–1.030)
pH: 7 (ref 5.0–8.0)

## 2017-12-24 LAB — CBC WITH DIFFERENTIAL/PLATELET
BASOS ABS: 0 10*3/uL (ref 0–0.1)
Basophils Relative: 0 %
EOS PCT: 0 %
Eosinophils Absolute: 0 10*3/uL (ref 0–0.7)
HEMATOCRIT: 33.9 % — AB (ref 35.0–47.0)
Hemoglobin: 11.2 g/dL — ABNORMAL LOW (ref 12.0–16.0)
LYMPHS ABS: 1.4 10*3/uL (ref 1.0–3.6)
LYMPHS PCT: 12 %
MCH: 29.4 pg (ref 26.0–34.0)
MCHC: 33.1 g/dL (ref 32.0–36.0)
MCV: 88.9 fL (ref 80.0–100.0)
MONO ABS: 0.9 10*3/uL (ref 0.2–0.9)
Monocytes Relative: 8 %
Neutro Abs: 9.3 10*3/uL — ABNORMAL HIGH (ref 1.4–6.5)
Neutrophils Relative %: 80 %
PLATELETS: 266 10*3/uL (ref 150–440)
RBC: 3.81 MIL/uL (ref 3.80–5.20)
RDW: 15 % — AB (ref 11.5–14.5)
WBC: 11.7 10*3/uL — ABNORMAL HIGH (ref 3.6–11.0)

## 2017-12-24 LAB — COMPREHENSIVE METABOLIC PANEL
ALBUMIN: 3.5 g/dL (ref 3.5–5.0)
ALT: 11 U/L — ABNORMAL LOW (ref 14–54)
ANION GAP: 12 (ref 5–15)
AST: 21 U/L (ref 15–41)
Alkaline Phosphatase: 108 U/L (ref 38–126)
BUN: 24 mg/dL — AB (ref 6–20)
CHLORIDE: 94 mmol/L — AB (ref 101–111)
CO2: 29 mmol/L (ref 22–32)
Calcium: 9.6 mg/dL (ref 8.9–10.3)
Creatinine, Ser: 1.1 mg/dL — ABNORMAL HIGH (ref 0.44–1.00)
GFR calc Af Amer: 52 mL/min — ABNORMAL LOW (ref >60)
GFR calc non Af Amer: 45 mL/min — ABNORMAL LOW (ref >60)
GLUCOSE: 100 mg/dL — AB (ref 65–99)
POTASSIUM: 3.5 mmol/L (ref 3.5–5.1)
SODIUM: 135 mmol/L (ref 135–145)
Total Bilirubin: 1.1 mg/dL (ref 0.3–1.2)
Total Protein: 8.7 g/dL — ABNORMAL HIGH (ref 6.5–8.1)

## 2017-12-24 LAB — LACTIC ACID, PLASMA
LACTIC ACID, VENOUS: 1.3 mmol/L (ref 0.5–1.9)
Lactic Acid, Venous: 3 mmol/L (ref 0.5–1.9)

## 2017-12-24 MED ORDER — SODIUM CHLORIDE 0.9 % IV BOLUS (SEPSIS)
1000.0000 mL | Freq: Once | INTRAVENOUS | Status: AC
  Administered 2017-12-24: 1000 mL via INTRAVENOUS

## 2017-12-24 MED ORDER — CEFTRIAXONE SODIUM 1 G IJ SOLR: 1.0000 g | INTRAMUSCULAR | Status: DC

## 2017-12-24 MED ORDER — PANTOPRAZOLE SODIUM 40 MG PO TBEC
40.0000 mg | DELAYED_RELEASE_TABLET | Freq: Every day | ORAL | Status: DC
  Administered 2017-12-24 – 2017-12-29 (×6): 40 mg via ORAL
  Filled 2017-12-24 (×6): qty 1

## 2017-12-24 MED ORDER — ADULT MULTIVITAMIN W/MINERALS CH
1.0000 | ORAL_TABLET | Freq: Every day | ORAL | Status: DC
  Administered 2017-12-24 – 2017-12-29 (×6): 1 via ORAL
  Filled 2017-12-24 (×6): qty 1

## 2017-12-24 MED ORDER — FERROUS SULFATE 325 (65 FE) MG PO TABS
325.0000 mg | ORAL_TABLET | Freq: Two times a day (BID) | ORAL | Status: DC
  Administered 2017-12-25 – 2017-12-29 (×8): 325 mg via ORAL
  Filled 2017-12-24 (×10): qty 1

## 2017-12-24 MED ORDER — ACETAMINOPHEN 650 MG RE SUPP: 650.0000 mg | Freq: Four times a day (QID) | RECTAL | Status: DC | PRN

## 2017-12-24 MED ORDER — VITAMIN D 1000 UNITS PO TABS
1000.0000 [IU] | ORAL_TABLET | Freq: Every day | ORAL | Status: DC
  Administered 2017-12-24 – 2017-12-29 (×6): 1000 [IU] via ORAL
  Filled 2017-12-24 (×6): qty 1

## 2017-12-24 MED ORDER — SODIUM CHLORIDE 0.9 % IV BOLUS (SEPSIS)
250.0000 mL | Freq: Once | INTRAVENOUS | Status: AC
  Administered 2017-12-24: 250 mL via INTRAVENOUS

## 2017-12-24 MED ORDER — ACETAMINOPHEN 325 MG PO TABS
650.0000 mg | ORAL_TABLET | Freq: Four times a day (QID) | ORAL | Status: DC | PRN
  Administered 2017-12-24 – 2017-12-26 (×2): 650 mg via ORAL
  Filled 2017-12-24: qty 2

## 2017-12-24 MED ORDER — GABAPENTIN 300 MG PO CAPS
300.0000 mg | ORAL_CAPSULE | Freq: Three times a day (TID) | ORAL | Status: DC
  Administered 2017-12-24 – 2017-12-29 (×12): 300 mg via ORAL
  Filled 2017-12-24 (×15): qty 1

## 2017-12-24 MED ORDER — SODIUM CHLORIDE 0.9 % IV SOLN
1.0000 g | Freq: Once | INTRAVENOUS | Status: AC
  Administered 2017-12-24: 1 g via INTRAVENOUS
  Filled 2017-12-24: qty 10

## 2017-12-24 MED ORDER — SODIUM CHLORIDE 0.9 % IV SOLN
INTRAVENOUS | Status: DC
  Administered 2017-12-24 – 2017-12-25 (×2): via INTRAVENOUS

## 2017-12-24 MED ORDER — NYSTATIN 100000 UNIT/ML MT SUSP: 5.0000 mL | Freq: Four times a day (QID) | OROMUCOSAL | Status: DC

## 2017-12-24 MED ORDER — CALCIUM CITRATE-VITAMIN D 500-500 MG-UNIT PO CHEW
1.0000 | CHEWABLE_TABLET | Freq: Every day | ORAL | Status: DC
  Administered 2017-12-24: 1 via ORAL
  Filled 2017-12-24 (×6): qty 1

## 2017-12-24 MED ORDER — LOSARTAN POTASSIUM 50 MG PO TABS
100.0000 mg | ORAL_TABLET | Freq: Every day | ORAL | Status: DC
  Administered 2017-12-24 – 2017-12-29 (×5): 100 mg via ORAL
  Filled 2017-12-24 (×6): qty 2

## 2017-12-24 MED ORDER — SODIUM CHLORIDE 0.9 % IV BOLUS (SEPSIS)
500.0000 mL | Freq: Once | INTRAVENOUS | Status: AC
  Administered 2017-12-24: 500 mL via INTRAVENOUS

## 2017-12-24 MED ORDER — SODIUM CHLORIDE 0.9 % IV SOLN
1.0000 g | INTRAVENOUS | Status: DC
  Administered 2017-12-25 – 2017-12-27 (×3): 1 g via INTRAVENOUS
  Filled 2017-12-24 (×4): qty 10

## 2017-12-24 MED ORDER — FUROSEMIDE 20 MG PO TABS
20.0000 mg | ORAL_TABLET | Freq: Every day | ORAL | Status: DC
  Administered 2017-12-24 – 2017-12-29 (×6): 20 mg via ORAL
  Filled 2017-12-24 (×6): qty 1

## 2017-12-24 MED ORDER — AMLODIPINE BESYLATE 10 MG PO TABS
10.0000 mg | ORAL_TABLET | Freq: Every day | ORAL | Status: DC
  Administered 2017-12-24 – 2017-12-29 (×5): 10 mg via ORAL
  Filled 2017-12-24 (×6): qty 1

## 2017-12-24 MED ORDER — ONDANSETRON HCL 4 MG/2ML IJ SOLN: 4.0000 mg | Freq: Four times a day (QID) | INTRAMUSCULAR | Status: DC | PRN

## 2017-12-24 MED ORDER — ENOXAPARIN SODIUM 40 MG/0.4ML ~~LOC~~ SOLN
40.0000 mg | SUBCUTANEOUS | Status: DC
  Administered 2017-12-24 – 2017-12-28 (×4): 40 mg via SUBCUTANEOUS
  Filled 2017-12-24 (×5): qty 0.4

## 2017-12-24 MED ORDER — ONDANSETRON HCL 4 MG PO TABS: 4.0000 mg | ORAL_TABLET | Freq: Four times a day (QID) | ORAL | Status: DC | PRN

## 2017-12-24 MED ORDER — ATENOLOL 25 MG PO TABS
25.0000 mg | ORAL_TABLET | Freq: Every day | ORAL | Status: DC
  Administered 2017-12-24 – 2017-12-28 (×4): 25 mg via ORAL
  Filled 2017-12-24 (×6): qty 1

## 2017-12-24 MED ORDER — ACETAMINOPHEN 325 MG PO TABS
650.0000 mg | ORAL_TABLET | Freq: Four times a day (QID) | ORAL | Status: DC | PRN
  Filled 2017-12-24: qty 2

## 2017-12-24 MED ORDER — OXYBUTYNIN CHLORIDE 5 MG PO TABS
5.0000 mg | ORAL_TABLET | Freq: Two times a day (BID) | ORAL | Status: DC
  Administered 2017-12-25 – 2017-12-29 (×8): 5 mg via ORAL
  Filled 2017-12-24 (×10): qty 1

## 2017-12-24 MED ORDER — HYDROXYZINE HCL 10 MG PO TABS
10.0000 mg | ORAL_TABLET | Freq: Three times a day (TID) | ORAL | Status: DC | PRN
  Filled 2017-12-24: qty 1

## 2017-12-24 NOTE — ED Provider Notes (Signed)
[facility] Emergency Department Provider Note   ____________________________________________   I have reviewed the triage vital signs and the nursing notes.   HISTORY  Chief Complaint AMS  History limited by and level 5 caveat due to: Altered Mental Status   HPI [name] is a 84 y.o. female who presents to the emergency department today via EMS from home because of concern for altered mental status. EMS states that the patient left [place] yesterday and is currently being treated for a UTI. The patient is on antibiotics. They stated that family says she can normally carry on a conversation. For EMS the patient had a fever.   Per medical record review patient has a history of recent ED visit and diagnosis of UTI.  Past Medical History:  Diagnosis Date  . Anemia   . Chronic kidney disease, stage 3 ([place])   . Degenerative joint disease (DJD) of lumbar spine   . Diastolic dysfunction   . Dizziness   . Edema   . ETD (eustachian tube dysfunction)   . Heartburn   . History of rectal bleeding   . HTN (hypertension)   . Hypercalcemia   . Intracranial aneurysm   . Low vision, one eye   . Nephritis and nephropathy   . Osteoporosis   . Pyuria   . Urge incontinence   . Urinary frequency   . Urinary retention     Patient Active Problem List   Diagnosis Date Noted  . Gastric foreign body   . Stricture and stenosis of esophagus   . Acute encephalopathy 12/03/2017  . Dysphagia 12/03/2017  . Pressure injury of skin 03/30/2017  . Sepsis ([place]) 03/28/2017  . Pressure ulcer 06/09/2016  . Syncope 06/08/2016  . Urinary tract infection 06/08/2016  . Periprosthetic fracture around internal prosthetic left knee joint 05/25/2016  . Fracture of femur, distal, left, closed ([place]) 05/25/2016  . Closed displaced fracture of base of fourth metacarpal bone of right hand 05/25/2016  . Closed displaced fracture of base of fifth metacarpal bone of right hand  05/25/2016  . Traumatic perinephric hematoma 05/09/2016  . SAH (subarachnoid hemorrhage) ([place]) 05/08/2016  . Trauma 05/08/2016  . SDH (subdural hematoma) ([employer]) 05/08/2016  . Perinephric hematoma 05/08/2016  . Hypertension 05/08/2016  . Fractures involving multiple body regions 05/08/2016  . CKD (chronic kidney disease) 05/08/2016  . Brain edema ([place]) 05/08/2016  . Acute blood loss anemia 05/08/2016    Past Surgical History:  Procedure Laterality Date  . dilation of esophageal web    . ESOPHAGOGASTRODUODENOSCOPY (EGD) WITH PROPOFOL N/A 12/06/2017   Procedure: ESOPHAGOGASTRODUODENOSCOPY (EGD) WITH PROPOFOL;  Surgeon: Lucilla Lame, MD;  Location: [facility] ENDOSCOPY;  Service: Endoscopy;  Laterality: N/A;  . FRACTURE SURGERY Left    femur  . IR GENERIC HISTORICAL  08/05/2016   IR CATHETER TUBE CHANGE 08/05/2016 [name], MD [facility]INTER[facility]  . IR GENERIC HISTORICAL  09/17/2016   IR CATHETER TUBE CHANGE 09/17/2016 [facility]  . IR GENERIC HISTORICAL  11/19/2016   IR CATHETER TUBE CHANGE 11/19/2016 [name], MD [facility]  . IR GENERIC HISTORICAL  11/29/2016   IR CATHETER TUBE CHANGE 11/29/2016 [name], DO [facility]  . JOINT REPLACEMENT Bilateral   . TOTAL KNEE ARTHROPLASTY Bilateral   . VAGINAL HYSTERECTOMY  1967   ovaries also removed because of fibroida    Prior to Admission medications   Medication Sig Start Date End Date Taking? Authorizing Provider  acetaminophen (TYLENOL) 325 MG tablet Take  650 mg by mouth 2 (two) times daily as needed.    [provider]  amLODipine (NORVASC) 10 MG tablet TAKE ONE TABLET BY MOUTH EVERY DAY HIGH BLOOD PRESSURE 12/13/16   [provider]  atenolol (TENORMIN) 25 MG tablet Take 1 tablet (25 mg total) by mouth daily. 06/10/16   Bettey Costa, MD  CALCIUM CITRATE+D3 PETITES 200-250 MG-UNIT TABS Take 1 tablet by mouth daily. 11/08/17   [provider]  cholecalciferol (VITAMIN D) 1000 units tablet Take 1,000  Units by mouth daily. 11/08/17   [provider]  feeding supplement, ENSURE ENLIVE, (ENSURE ENLIVE) LIQD Take 237 mLs by mouth 3 (three) times daily between meals. 03/30/17   Bettey Costa, MD  FEROSUL 325 (65 Fe) MG tablet Take 325 mg by mouth 2 (two) times daily. 11/08/17   [provider]  furosemide (LASIX) 20 MG tablet Take 20 mg by mouth daily. 11/08/17   [provider]  gabapentin (NEURONTIN) 300 MG capsule Take 300 mg by mouth 3 (three) times daily. 11/08/17   [provider]  hydrochlorothiazide (HYDRODIURIL) 25 MG tablet Take 25 mg by mouth daily. 11/03/17   [provider]  hydrOXYzine (ATARAX/VISTARIL) 10 MG tablet Take 10 mg by mouth 3 (three) times daily as needed. 11/08/17   [provider]  losartan (COZAAR) 100 MG tablet Take 1 tablet (100 mg total) by mouth daily. 06/10/16   Bettey Costa, MD  Multiple Vitamin (MULTIVITAMIN) tablet Take 1 tablet by mouth daily. 06/10/16   Bettey Costa, MD  nystatin (MYCOSTATIN) 100000 UNIT/ML suspension Take 5 mLs (500,000 Units total) by mouth 4 (four) times daily. 03/30/17   Bettey Costa, MD  oxybutynin (DITROPAN) 5 MG tablet Take 5 mg by mouth 2 (two) times daily. 12/04/16   [provider]  pantoprazole (PROTONIX) 40 MG tablet Take 1 tablet (40 mg total) by mouth daily. Patient not taking: Reported on 12/03/2017 03/31/17 07/14/17  Bettey Costa, MD    Allergies Patient has no known allergies.  Family History  Problem Relation Age of Onset  . Kidney disease Neg Hx   . Bladder Cancer Neg Hx   . Kidney cancer Neg Hx     Social History Social History   Tobacco Use  . Smoking status: Former Smoker    Last attempt to quit: 07/23/1964    Years since quitting: 53.4  . Smokeless tobacco: Never Used  . Tobacco comment: quit 1965  Substance Use Topics  . Alcohol use: No    Alcohol/week: 0.0 oz  . Drug use: No    Review of Systems Constitutional: No fever/chills Eyes: No visual  changes. ENT: No sore throat. Cardiovascular: Denies chest pain. Respiratory: Denies shortness of breath. Gastrointestinal: No abdominal pain.  No nausea, no vomiting.  No diarrhea.   Genitourinary: Negative for dysuria. Musculoskeletal: Negative for back pain. Skin: Negative for rash. Neurological: Negative for headaches, focal weakness or numbness.  ____________________________________________   PHYSICAL EXAM:  VITAL SIGNS: ED Triage Vitals  Enc Vitals Group     BP 136/61     Pulse 92     Resp 20     Temp 101.1     Temp src      SpO2 96    Constitutional: Awake, alert. Not oriented.  Eyes: Conjunctivae are normal.  ENT   Head: Normocephalic and atraumatic.   Nose: No congestion/rhinnorhea.   Mouth/Throat: Mucous membranes are moist.   Neck: No stridor. Hematological/Lymphatic/Immunilogical: No cervical lymphadenopathy. Cardiovascular: Normal rate, regular rhythm.  No murmurs, rubs, or gallops.  Respiratory: Normal respiratory effort without tachypnea nor retractions. Breath sounds are clear and equal bilaterally. No wheezes/rales/rhonchi. Gastrointestinal: Soft and non tender. No rebound. No guarding.  Genitourinary: Deferred Musculoskeletal: Normal range of motion in all extremities. No lower extremity edema. Neurologic:  Normal speech and language. No gross focal neurologic deficits are appreciated.  Skin:  Skin is warm, dry and intact. No rash noted. Psychiatric: Mood and affect are normal. Speech and behavior are normal. Patient exhibits appropriate insight and judgment.  ____________________________________________    LABS (pertinent positives/negatives)  Lactic acid 1.3 UA consistent with infection CBC wbc 11.7, hgb 11.2, plt 266 CMP na 135, k 3.5, cr 1.10  ____________________________________________   EKG  None  ____________________________________________     RADIOLOGY  None  ____________________________________________   PROCEDURES  Procedures  ____________________________________________   INITIAL IMPRESSION / ASSESSMENT AND PLAN / ED COURSE  Pertinent labs & imaging results that were available during my care of the patient were reviewed by me and considered in my medical decision making (see chart for details).  Patient presented to the emergency department brought in by EMS because of concerns for increasing confusion.  Patient apparently was recently being treated for or is perhaps currently being treated for urinary tract infection.  Patient was febrile with heart rate greater than 90 when she first presented.  This was concerning for sepsis and code sepsis was called.  Urine is consistent with a urinary tract infection.  Patient was given dose of IV antibiotics.  Will plan on admission.   ____________________________________________   FINAL CLINICAL IMPRESSION(S) / ED DIAGNOSES  Final diagnoses:  Lower urinary tract infectious disease  Confusion     Note: This dictation was prepared with Dragon dictation. Any transcriptional errors that result from this process are unintentional     Nance Pear, MD 12/24/17 1341

## 2017-12-24 NOTE — ED Notes (Signed)
Code sepsis called to [facility] 

## 2017-12-24 NOTE — Progress Notes (Signed)
Chaplain met with patient and family. Patient was not able to answer screening question (e.g., Where are you?, Who's the President?). It was agreed with sister, Mariane Duval, to attempt AD on Monday after the patient has received treatment.

## 2017-12-24 NOTE — Progress Notes (Signed)
Critical lactic acid- 3.0 reported to Dr. Posey Pronto. No new orders at this time.

## 2017-12-24 NOTE — H&P (Signed)
[place] [facility] [place] NAME: [name]    MR#:  [id number]  DATE OF BIRTH:  [birth year]  DATE OF ADMISSION:  12/24/2017  PRIMARY CARE PHYSICIAN: Juluis Pitch, MD   REQUESTING/REFERRING PHYSICIAN:   CHIEF COMPLAINT:   Chief Complaint  Patient presents with  . Altered Mental Status    HISTORY OF PRESENT ILLNESS: [name]  is a 84 y.o. female with a known history of anemia, chronic kidney disease stage III, essential hypertension and recurrent urinary tract infection who is brought to the hospital by family due to confusion.  Patient apparently was discharged from [place] yesterday.  There is currently no family available at bedside right now.  Patient was noted to be confused brought to the ED was noted to have a urinary tract infection.  PAST MEDICAL HISTORY:   Past Medical History:  Diagnosis Date  . Anemia   . Chronic kidney disease, stage 3 ([place])   . Degenerative joint disease (DJD) of lumbar spine   . Diastolic dysfunction   . Dizziness   . Edema   . ETD (eustachian tube dysfunction)   . Heartburn   . History of rectal bleeding   . HTN (hypertension)   . Hypercalcemia   . Intracranial aneurysm   . Low vision, one eye   . Nephritis and nephropathy   . Osteoporosis   . Pyuria   . Urge incontinence   . Urinary frequency   . Urinary retention     PAST SURGICAL HISTORY:  Past Surgical History:  Procedure Laterality Date  . dilation of esophageal web    . ESOPHAGOGASTRODUODENOSCOPY (EGD) WITH PROPOFOL N/A 12/06/2017   Procedure: ESOPHAGOGASTRODUODENOSCOPY (EGD) WITH PROPOFOL;  Surgeon: Lucilla Lame, MD;  Location: [facility] ENDOSCOPY;  Service: Endoscopy;  Laterality: N/A;  . FRACTURE SURGERY Left    femur  . IR GENERIC HISTORICAL  08/05/2016   IR CATHETER TUBE CHANGE 08/05/2016 [name], MD [facility]  . IR GENERIC HISTORICAL  09/17/2016   IR CATHETER TUBE CHANGE 09/17/2016 [facility]  . IR GENERIC  HISTORICAL  11/19/2016   IR CATHETER TUBE CHANGE 11/19/2016 [name], MD [facility]  . IR GENERIC HISTORICAL  11/29/2016   IR CATHETER TUBE CHANGE 11/29/2016 [name], DO [facility]  . JOINT REPLACEMENT Bilateral   . TOTAL KNEE ARTHROPLASTY Bilateral   . VAGINAL HYSTERECTOMY  1967   ovaries also removed because of fibroida    SOCIAL HISTORY:  Social History   Tobacco Use  . Smoking status: Former Smoker    Last attempt to quit: 07/23/1964    Years since quitting: 53.4  . Smokeless tobacco: Never Used  . Tobacco comment: quit 1965  Substance Use Topics  . Alcohol use: No    Alcohol/week: 0.0 oz    FAMILY HISTORY:  Family History  Problem Relation Age of Onset  . Kidney disease Neg Hx   . Bladder Cancer Neg Hx   . Kidney cancer Neg Hx     DRUG ALLERGIES: No Known Allergies  REVIEW OF SYSTEMS:   CONSTITUTIONAL: Confusion unable to provide MEDICATIONS AT HOME:  Prior to Admission medications   Medication Sig Start Date End Date Taking? Authorizing Provider  acetaminophen (TYLENOL) 325 MG tablet Take 650 mg by mouth every 6 (six) hours as needed for mild pain.    Yes [provider]  amLODipine (NORVASC) 10 MG tablet TAKE ONE TABLET BY MOUTH EVERY DAY HIGH BLOOD PRESSURE 12/13/16  Yes  [provider]  atenolol (TENORMIN) 25 MG tablet Take 1 tablet (25 mg total) by mouth daily. 06/10/16  Yes Mody, Sital, MD  CALCIUM CITRATE+D3 PETITES 200-250 MG-UNIT TABS Take 1 tablet by mouth daily. 11/08/17  Yes [provider]  cholecalciferol (VITAMIN D) 1000 units tablet Take 1,000 Units by mouth daily. 11/08/17  Yes [provider]  feeding supplement, ENSURE ENLIVE, (ENSURE ENLIVE) LIQD Take 237 mLs by mouth 3 (three) times daily between meals. 03/30/17  Yes Mody, Sital, MD  FEROSUL 325 (65 Fe) MG tablet Take 325 mg by mouth 2 (two) times daily. 11/08/17  Yes [provider]  furosemide (LASIX) 20 MG tablet Take 20 mg by mouth daily.  11/08/17  Yes [provider]  gabapentin (NEURONTIN) 300 MG capsule Take 300 mg by mouth 3 (three) times daily. 11/08/17  Yes [provider]  hydrochlorothiazide (HYDRODIURIL) 25 MG tablet Take 25 mg by mouth daily. 11/03/17  Yes [provider]  hydrOXYzine (ATARAX/VISTARIL) 10 MG tablet Take 10 mg by mouth 3 (three) times daily as needed. 11/08/17  Yes [provider]  losartan (COZAAR) 100 MG tablet Take 1 tablet (100 mg total) by mouth daily. 06/10/16  Yes Bettey Costa, MD  Multiple Vitamin (MULTIVITAMIN) tablet Take 1 tablet by mouth daily. 06/10/16  Yes Mody, Ulice Bold, MD  oxybutynin (DITROPAN) 5 MG tablet Take 5 mg by mouth 2 (two) times daily. 12/04/16  Yes [provider]  silver sulfADIAZINE (SILVADENE) 1 % cream Apply 1 application topically 2 (two) times daily. Apply to clean wound dressing. 12/01/17  Yes [provider]  nystatin (MYCOSTATIN) 100000 UNIT/ML suspension Take 5 mLs (500,000 Units total) by mouth 4 (four) times daily. Patient not taking: Reported on 12/24/2017 03/30/17   Bettey Costa, MD  pantoprazole (PROTONIX) 40 MG tablet Take 1 tablet (40 mg total) by mouth daily. Patient not taking: Reported on 12/03/2017 03/31/17 07/14/17  Bettey Costa, MD      PHYSICAL EXAMINATION:   VITAL SIGNS: Blood pressure (!) 135/55, pulse 88, temperature (!) 101.1 F (38.4 C), temperature source Rectal, resp. rate 15, height 5\' 3"  (1.6 m), weight 125 lb 9.6 oz (57 kg), SpO2 95 %.  GENERAL:  84 y.o.-year-old patient lying in the bed with no acute distress.  EYES: Pupils equal, round, reactive to light and accommodation. No scleral icterus. Extraocular muscles intact.  HEENT: Head atraumatic, normocephalic. Oropharynx and nasopharynx clear.  NECK:  Supple, no jugular venous distention. No thyroid enlargement, no tenderness.  LUNGS: Normal breath sounds bilaterally, no wheezing, rales,rhonchi or crepitation. No use of accessory muscles of respiration.   CARDIOVASCULAR: S1, S2 normal. No murmurs, rubs, or gallops.  ABDOMEN: Soft, nontender, nondistended. Bowel sounds present. No organomegaly or mass.  EXTREMITIES: No pedal edema, cyanosis, or clubbing.  NEUROLOGIC: Limited unable to follow commands PSYCHIATRIC: The patient is alert not oriented  sKIN: No obvious rash, lesion, or ulcer.   LABORATORY PANEL:   CBC Recent Labs  Lab 12/24/17 1225  WBC 11.7*  HGB 11.2*  HCT 33.9*  PLT 266  MCV 88.9  MCH 29.4  MCHC 33.1  RDW 15.0*  LYMPHSABS 1.4  MONOABS 0.9  EOSABS 0.0  BASOSABS 0.0   ------------------------------------------------------------------------------------------------------------------  Chemistries  Recent Labs  Lab 12/24/17 1225  NA 135  K 3.5  CL 94*  CO2 29  GLUCOSE 100*  BUN 24*  CREATININE 1.10*  CALCIUM 9.6  AST 21  ALT 11*  ALKPHOS 108  BILITOT 1.1   ------------------------------------------------------------------------------------------------------------------ estimated  creatinine clearance is 31.5 mL/min (A) (by C-G formula based on SCr of 1.1 mg/dL (H)). ------------------------------------------------------------------------------------------------------------------ No results for input(s): TSH, T4TOTAL, T3FREE, THYROIDAB in the last 72 hours.  Invalid input(s): FREET3   Coagulation profile No results for input(s): INR, PROTIME in the last 168 hours. ------------------------------------------------------------------------------------------------------------------- No results for input(s): DDIMER in the last 72 hours. -------------------------------------------------------------------------------------------------------------------  Cardiac Enzymes No results for input(s): CKMB, TROPONINI, MYOGLOBIN in the last 168 hours.  Invalid input(s): CK ------------------------------------------------------------------------------------------------------------------ Invalid input(s):  POCBNP  ---------------------------------------------------------------------------------------------------------------  Urinalysis    Component Value Date/Time   COLORURINE YELLOW (A) 12/24/2017 1225   APPEARANCEUR CLOUDY (A) 12/24/2017 1225   APPEARANCEUR Cloudy (A) 04/23/2016 1451   LABSPEC 1.009 12/24/2017 1225   LABSPEC 1.011 11/25/2011 1204   PHURINE 7.0 12/24/2017 1225   GLUCOSEU NEGATIVE 12/24/2017 1225   GLUCOSEU Negative 11/25/2011 1204   HGBUR MODERATE (A) 12/24/2017 1225   BILIRUBINUR NEGATIVE 12/24/2017 1225   BILIRUBINUR Negative 04/23/2016 1451   BILIRUBINUR Negative 11/25/2011 1204   KETONESUR NEGATIVE 12/24/2017 1225   PROTEINUR 30 (A) 12/24/2017 1225   NITRITE POSITIVE (A) 12/24/2017 1225   LEUKOCYTESUR LARGE (A) 12/24/2017 1225   LEUKOCYTESUR 3+ (A) 04/23/2016 1451   LEUKOCYTESUR Negative 11/25/2011 1204     RADIOLOGY: No results found.  EKG: Orders placed or performed during the hospital encounter of 12/03/17  . ED EKG 12-Lead  . ED EKG 12-Lead  . EKG 12-Lead  . EKG 12-Lead    IMPRESSION AND PLAN: Patient is a 84 year old African-American female with recent admission for dysphasia as well as UTI  1.  Acute encephalopathy due to recurrent urinary tract infection continue ceftriaxone follow urine cultures  2.  Chronic kidney disease stage III appears to be stable we will give her IV fluids monitor renal function  3.  Essential hypertension continue therapy with amlodipine, atenolol, Cozaar discontinue HCTZ  4.  GERD continue Protonix  5.  Miscellaneous Lovenox for DVT prophylaxis  6.  CODE STATUS DNR as per recent admission     All the records are reviewed and case discussed with ED provider. Management plans discussed with the patient, family and they are in agreement.  CODE STATUS:    Code Status Orders  (From admission, onward)        Start     Ordered   12/24/17 1418  Do not attempt resuscitation (DNR)  Continuous    Question  Answer Comment  In the event of cardiac or respiratory ARREST Do not call a "code blue"   In the event of cardiac or respiratory ARREST Do not perform Intubation, CPR, defibrillation or ACLS   In the event of cardiac or respiratory ARREST Use medication by any route, position, wound care, and other measures to relive pain and suffering. May use oxygen, suction and manual treatment of airway obstruction as needed for comfort.      12/24/17 1417    Code Status History    Date Active Date Inactive Code Status Order ID Comments User Context   12/06/2017 14:06 12/06/2017 20:06 DNR [id number]  Fritzi Mandes, MD Inpatient   12/03/2017 15:06 12/06/2017 14:06 Full Code [id number]  Idelle Crouch, MD ED   03/28/2017 16:49 03/30/2017 21:57 DNR [id number]  Hillary Bow, MD ED   06/08/2016 20:40 06/11/2016 21:41 Full Code [id number]  Hower, Aaron Mose, MD ED    Advance Directive Documentation     Most Recent Value  Type of Advance Directive  -- [pt unable to answer due to AMS]  Pre-existing out of facility DNR order (yellow form or pink MOST form)  No data  "MOST" Form in Place?  No data       TOTAL TIME TAKING CARE OF THIS PATIENT: 78minutes.    Dustin Flock M.D on 12/24/2017 at 2:19 PM  Between 7am to 6pm - Pager - [phone]  After 6pm go to [email or web address] - password [email or web address] [facility]  [phone]  CC: Primary care physician; Juluis Pitch, MD

## 2017-12-24 NOTE — ED Triage Notes (Signed)
Pt arrives via ems from home. Ems reports pt left [facility] yesterday. Pt recently diagnosed with UTI , ems states pt has been receiving treatment for uti but unsure of meds, arrives with foley in place pt unable to answer questions at this time. NAD

## 2017-12-24 NOTE — Progress Notes (Signed)
CODE SEPSIS - PHARMACY COMMUNICATION  **Broad Spectrum Antibiotics should be administered within 1 hour of Sepsis diagnosis**  Time Code Sepsis Called/Page Received: 1233  Antibiotics Ordered: Ceftriaxone  Time of 1st antibiotic administration: 1317  Additional action taken by pharmacy: Called ED MD for Abx order  If necessary, Name of Provider/Nurse Contacted: Dr. Jolayne Panther, PharmD Pharmacy Resident 12/24/2017  1:09 PM

## 2017-12-25 LAB — CBC
HCT: 31.3 % — ABNORMAL LOW (ref 35.0–47.0)
HEMOGLOBIN: 10.4 g/dL — AB (ref 12.0–16.0)
MCH: 29.8 pg (ref 26.0–34.0)
MCHC: 33.1 g/dL (ref 32.0–36.0)
MCV: 90 fL (ref 80.0–100.0)
Platelets: 229 10*3/uL (ref 150–440)
RBC: 3.47 MIL/uL — ABNORMAL LOW (ref 3.80–5.20)
RDW: 15.2 % — AB (ref 11.5–14.5)
WBC: 12.7 10*3/uL — ABNORMAL HIGH (ref 3.6–11.0)

## 2017-12-25 LAB — BLOOD CULTURE ID PANEL (REFLEXED)
ACINETOBACTER BAUMANNII: NOT DETECTED
CANDIDA ALBICANS: NOT DETECTED
CANDIDA KRUSEI: NOT DETECTED
CANDIDA PARAPSILOSIS: NOT DETECTED
Candida glabrata: NOT DETECTED
Candida tropicalis: NOT DETECTED
ENTEROCOCCUS SPECIES: NOT DETECTED
ESCHERICHIA COLI: NOT DETECTED
Enterobacter cloacae complex: NOT DETECTED
Enterobacteriaceae species: NOT DETECTED
Haemophilus influenzae: NOT DETECTED
KLEBSIELLA OXYTOCA: NOT DETECTED
Klebsiella pneumoniae: NOT DETECTED
LISTERIA MONOCYTOGENES: NOT DETECTED
Neisseria meningitidis: NOT DETECTED
Proteus species: NOT DETECTED
Pseudomonas aeruginosa: NOT DETECTED
SERRATIA MARCESCENS: NOT DETECTED
STAPHYLOCOCCUS AUREUS BCID: NOT DETECTED
Staphylococcus species: NOT DETECTED
Streptococcus agalactiae: NOT DETECTED
Streptococcus pneumoniae: NOT DETECTED
Streptococcus pyogenes: NOT DETECTED
Streptococcus species: NOT DETECTED

## 2017-12-25 LAB — BASIC METABOLIC PANEL
Anion gap: 10 (ref 5–15)
BUN: 19 mg/dL (ref 6–20)
CALCIUM: 9.2 mg/dL (ref 8.9–10.3)
CO2: 29 mmol/L (ref 22–32)
CREATININE: 0.79 mg/dL (ref 0.44–1.00)
Chloride: 97 mmol/L — ABNORMAL LOW (ref 101–111)
GFR calc non Af Amer: >60 mL/min (ref >60)
Glucose, Bld: 112 mg/dL — ABNORMAL HIGH (ref 65–99)
Potassium: 3.4 mmol/L — ABNORMAL LOW (ref 3.5–5.1)
SODIUM: 136 mmol/L (ref 135–145)

## 2017-12-25 NOTE — Progress Notes (Signed)
PHARMACY - PHYSICIAN COMMUNICATION CRITICAL VALUE ALERT - BLOOD CULTURE IDENTIFICATION (BCID)  Results for orders placed or performed during the hospital encounter of 12/24/17  Blood Culture ID Panel (Reflexed) (Collected: 12/24/2017  1:15 PM)  Result Value Ref Range   Enterococcus species NOT DETECTED NOT DETECTED   Listeria monocytogenes NOT DETECTED NOT DETECTED   Staphylococcus species NOT DETECTED NOT DETECTED   Staphylococcus aureus NOT DETECTED NOT DETECTED   Streptococcus species NOT DETECTED NOT DETECTED   Streptococcus agalactiae NOT DETECTED NOT DETECTED   Streptococcus pneumoniae NOT DETECTED NOT DETECTED   Streptococcus pyogenes NOT DETECTED NOT DETECTED   Acinetobacter baumannii NOT DETECTED NOT DETECTED   Enterobacteriaceae species NOT DETECTED NOT DETECTED   Enterobacter cloacae complex NOT DETECTED NOT DETECTED   Escherichia coli NOT DETECTED NOT DETECTED   Klebsiella oxytoca NOT DETECTED NOT DETECTED   Klebsiella pneumoniae NOT DETECTED NOT DETECTED   Proteus species NOT DETECTED NOT DETECTED   Serratia marcescens NOT DETECTED NOT DETECTED   Haemophilus influenzae NOT DETECTED NOT DETECTED   Neisseria meningitidis NOT DETECTED NOT DETECTED   Pseudomonas aeruginosa NOT DETECTED NOT DETECTED   Candida albicans NOT DETECTED NOT DETECTED   Candida glabrata NOT DETECTED NOT DETECTED   Candida krusei NOT DETECTED NOT DETECTED   Candida parapsilosis NOT DETECTED NOT DETECTED   Candida tropicalis NOT DETECTED NOT DETECTED    Name of physician (or Provider) ContactedPosey Pronto, Shreyang  Changes to prescribed antibiotics required: none  Napoleon Form 12/25/2017  7:27 PM

## 2017-12-25 NOTE — Progress Notes (Signed)
Time is hr behind

## 2017-12-25 NOTE — Progress Notes (Signed)
[place] at [place] NAME: [name]    MR#:  [id number]  DATE OF BIRTH:  1930-12-04  SUBJECTIVE:   Patient has a significant amount of dementia.  No family present.  She is confused.  No issues per RN.  Was discharged about a day ago from rehab and brought in by family for confusion found to have UTI REVIEW OF SYSTEMS:   Review of Systems  Unable to perform ROS: Dementia   Tolerating Diet: Yes Tolerating PT: Pending  DRUG ALLERGIES:  No Known Allergies  VITALS:  Blood pressure (!) 140/58, pulse 88, temperature (!) 100.9 F (38.3 C), temperature source Oral, resp. rate 15, height 5\' 3"  (1.6 m), weight 57 kg (125 lb 9.6 oz), SpO2 99 %.  PHYSICAL EXAMINATION:   Physical Exam  GENERAL:  84 y.o.-year-old patient lying in the bed with no acute distress.  EYES: Pupils equal, round, reactive to light and accommodation. No scleral icterus. Extraocular muscles intact.  HEENT: Head atraumatic, normocephalic. Oropharynx and nasopharynx clear.  Edentulous NECK:  Supple, no jugular venous distention. No thyroid enlargement, no tenderness.  LUNGS: Normal breath sounds bilaterally, no wheezing, rales, rhonchi. No use of accessory muscles of respiration.  CARDIOVASCULAR: S1, S2 normal. No murmurs, rubs, or gallops.  ABDOMEN: Soft, nontender, nondistended. Bowel sounds present. No organomegaly or mass.  EXTREMITIES: No cyanosis, clubbing or edema b/l.    NEUROLOGIC: No focal deficit.  Limited exam secondary dementia. PSYCHIATRIC:  patient is alert and awake SKIN: No obvious rash, lesion, or ulcer.   LABORATORY PANEL:  CBC Recent Labs  Lab 12/25/17 0717  WBC 12.7*  HGB 10.4*  HCT 31.3*  PLT 229    Chemistries  Recent Labs  Lab 12/24/17 1225 12/25/17 0717  NA 135 136  K 3.5 3.4*  CL 94* 97*  CO2 29 29  GLUCOSE 100* 112*  BUN 24* 19  CREATININE 1.10* 0.79  CALCIUM 9.6 9.2  AST 21  --   ALT 11*  --   ALKPHOS 108  --    BILITOT 1.1  --    Cardiac Enzymes No results for input(s): TROPONINI in the last 168 hours. RADIOLOGY:  No results found. ASSESSMENT AND PLAN:  [name]  is a 84 y.o. female with a known history of anemia, chronic kidney disease stage III, essential hypertension and recurrent urinary tract infection who is brought to the hospital by family due to confusion.  Patient apparently was discharged from [facility] yesterday  1.  Acute encephalopathy due to recurrent urinary tract infection continue ceftriaxone follow urine cultures  2.  Chronic kidney disease stage III appears to be stable we will give her IV fluids monitor renal function  3.  Essential hypertension continue therapy with amlodipine, atenolol, Cozaar  -discontinue HCTZ  4.  GERD continue Protonix  5.  Miscellaneous Lovenox for DVT prophylaxis  6.  CODE STATUS DNR as per recent admission  PT pending.  Social worker for discharge planning  Case discussed with Care Management/Social Worker. Management plans discussed with the patient, family and they are in agreement.  CODE STATUS: DNR  DVT Prophylaxis: Lovenox  TOTAL TIME TAKING CARE OF THIS PATIENT: 30 minutes.  >50% time spent on counselling and coordination of care  POSSIBLE D/C IN 1-2 DAYS, DEPENDING ON CLINICAL CONDITION.  Note: This dictation was prepared with Dragon dictation along with smaller phrase technology. Any transcriptional errors that result from this process are unintentional.  [name]  M.D on 12/25/2017 at 1:51 PM  Between 7am to 6pm - Pager - [phone]  After 6pm go to [email or web address] - password [email or web address] [facility]  Office  [phone]  CC: Primary care physician; Juluis Pitch, MDPatient ID: [name], female   DOB: 10/20/1930, 84 y.o.   MRN: [id number]

## 2017-12-25 NOTE — Evaluation (Signed)
Physical Therapy Evaluation Patient Details Name: [name] MRN: [id number] DOB: 11-Jun-1931 Today's Date: 12/25/2017   History of Present Illness  84 y/o female that presents with UTI after just having left SNF, now confused with elevated lactic acid, low vision and history of CKD and UTI.  Pt does report she was home with a person whom she cannot identify.  Clinical Impression  Pt is up to side of bed with a great deal of assistance and worked on trying to get her to control sit posture.  Very challenging and she uses one UE but still drifts back on the bed.  Has trouble with her limited knee flexion and inverted L ankle to be able to place feet for support.  Will follow up with her to go to SNF and continue PT acutely to shorten stay in SNF.    Follow Up Recommendations SNF    Equipment Recommendations  None recommended by PT    Recommendations for Other Services       Precautions / Restrictions Precautions Precautions: Fall Restrictions Weight Bearing Restrictions: No Other Position/Activity Restrictions: has L foot in inversion      Mobility  Bed Mobility Overal bed mobility: Needs Assistance Bed Mobility: Supine to Sit;Sit to Supine     Supine to sit: Mod assist;Max assist Sit to supine: Max assist   General bed mobility comments: posterior leaning on bedside and knees are in contracture so cannot reach her feet on the floor  Transfers Overall transfer level: Needs assistance Equipment used: 1 person hand held assist             General transfer comment: Pt cannot get her feet to the floor to effectively assist and leans back dangerously  Ambulation/Gait             General Gait Details: non ambulatory  Stairs            Wheelchair Mobility    Modified Rankin (Stroke Patients Only)       Balance Overall balance assessment: Needs assistance Sitting-balance support: Bilateral upper extremity supported;Feet supported(trunk  support) Sitting balance-Leahy Scale: Poor         Standing balance comment: unable to get legs in posture to attempt                             Pertinent Vitals/Pain Pain Assessment: Faces Faces Pain Scale: Hurts little more Pain Location: L leg with open wounds Pain Intervention(s): Monitored during session;Repositioned    Home Living Family/patient expects to be discharged to:: Unsure Living Arrangements: Other relatives               Additional Comments: pt cannot tell PT the relationship to person with whom she lives    Prior Function Level of Independence: Needs assistance   Gait / Transfers Assistance Needed: pt states she walked recently but L foot is inverted and NWB as a result  ADL's / Homemaking Assistance Needed: pt is dependent for mobility to assist with house        Hand Dominance   Dominant Hand: Right    Extremity/Trunk Assessment   Upper Extremity Assessment Upper Extremity Assessment: Generalized weakness    Lower Extremity Assessment Lower Extremity Assessment: Generalized weakness    Cervical / Trunk Assessment Cervical / Trunk Assessment: Kyphotic  Communication   Communication: No difficulties  Cognition Arousal/Alertness: Lethargic Behavior During Therapy: Flat affect Overall Cognitive Status: Difficult to assess  General Comments: Minimal conversation, patient does not answer many questions or provide insight into situation.       General Comments      Exercises     Assessment/Plan    PT Assessment Patient needs continued PT services  PT Problem List Decreased strength;Decreased range of motion;Decreased activity tolerance;Decreased balance;Decreased mobility;Decreased coordination;Decreased cognition;Decreased knowledge of use of DME;Decreased safety awareness;Decreased skin integrity;Pain       PT Treatment Interventions DME instruction;Gait  training;Functional mobility training;Therapeutic activities;Therapeutic exercise;Balance training;Neuromuscular re-education;Patient/family education    PT Goals (Current goals can be found in the Care Plan section)  Acute Rehab PT Goals Patient Stated Goal: not stated PT Goal Formulation: Patient unable to participate in goal setting Time For Goal Achievement: 01/08/18 Potential to Achieve Goals: Fair    Frequency Min 2X/week   Barriers to discharge Other (comment)(not clear on her home situation and assistance) total care for pt now    Co-evaluation               AM-PAC PT "6 Clicks" Daily Activity  Outcome Measure Difficulty turning over in bed (including adjusting bedclothes, sheets and blankets)?: Unable Difficulty moving from lying on back to sitting on the side of the bed? : Unable Difficulty sitting down on and standing up from a chair with arms (e.g., wheelchair, bedside commode, etc,.)?: Unable Help needed moving to and from a bed to chair (including a wheelchair)?: A Lot Help needed walking in hospital room?: Total Help needed climbing 3-5 steps with a railing? : Total 6 Click Score: 7    End of Session Equipment Utilized During Treatment: Gait belt Activity Tolerance: Patient limited by fatigue;Other (comment)(limited by cognition and LE contractures) Patient left: in bed;with call bell/phone within reach;with bed alarm set Nurse Communication: Mobility status PT Visit Diagnosis: Muscle weakness (generalized) (M62.81);Difficulty in walking, not elsewhere classified (R26.2);Other abnormalities of gait and mobility (R26.89);Adult, failure to thrive (R62.7)    Time: 1129-1150 PT Time Calculation (min) (ACUTE ONLY): 21 min   Charges:   PT Evaluation $PT Eval Moderate Complexity: 1 Mod     PT G Codes:   PT G-Codes **NOT FOR INPATIENT CLASS** Functional Assessment Tool Used: AM-PAC 6 Clicks Basic Mobility    Ramond Dial 12/25/2017, 3:30 PM   Mee Hives,  PT MS Acute Rehab Dept. Number: [place] and [place]

## 2017-12-26 ENCOUNTER — Inpatient Hospital Stay: Payer: Medicare HMO

## 2017-12-26 LAB — LACTIC ACID, PLASMA
LACTIC ACID, VENOUS: 0.8 mmol/L (ref 0.5–1.9)
LACTIC ACID, VENOUS: 1.7 mmol/L (ref 0.5–1.9)

## 2017-12-26 MED ORDER — POTASSIUM CHLORIDE CRYS ER 20 MEQ PO TBCR
40.0000 meq | EXTENDED_RELEASE_TABLET | Freq: Once | ORAL | Status: AC
  Administered 2017-12-26: 40 meq via ORAL
  Filled 2017-12-26: qty 2

## 2017-12-26 NOTE — NC FL2 (Signed)
[place] LEVEL OF CARE SCREENING TOOL     IDENTIFICATION  Patient Name: [name] Birthdate: 1931-06-02 Sex: female Admission Date (Current Location): 12/24/2017  [name] Number:  Engineering geologist and Address:  [name], [address] [place], [place] [address]      Provider Number: [id number]  Attending Physician Name and Address:  Bettey Costa, MD  Relative Name and Phone Number:       Current Level of Care: Hospital Recommended Level of Care: [place] Prior Approval Number:    Date Approved/Denied:   PASRR Number:    Discharge Plan: SNF    Current Diagnoses: Patient Active Problem List   Diagnosis Date Noted  . Gastric foreign body   . Stricture and stenosis of esophagus   . Acute encephalopathy 12/03/2017  . Dysphagia 12/03/2017  . Pressure injury of skin 03/30/2017  . Sepsis ([place]) 03/28/2017  . Pressure ulcer 06/09/2016  . Syncope 06/08/2016  . Urinary tract infection 06/08/2016  . Periprosthetic fracture around internal prosthetic left knee joint 05/25/2016  . Fracture of femur, distal, left, closed ([place]) 05/25/2016  . Closed displaced fracture of base of fourth metacarpal bone of right hand 05/25/2016  . Closed displaced fracture of base of fifth metacarpal bone of right hand 05/25/2016  . Traumatic perinephric hematoma 05/09/2016  . SAH (subarachnoid hemorrhage) ([place]) 05/08/2016  . Trauma 05/08/2016  . SDH (subdural hematoma) ([place]) 05/08/2016  . Perinephric hematoma 05/08/2016  . Hypertension 05/08/2016  . Fractures involving multiple body regions 05/08/2016  . CKD (chronic kidney disease) 05/08/2016  . Brain edema ([place]) 05/08/2016  . Acute blood loss anemia 05/08/2016    Orientation RESPIRATION BLADDER Height & Weight     Self, Place, Situation  Normal Incontinent Weight: 125 lb 9.6 oz (57 kg) Height:  5\' 3"  (160 cm)  BEHAVIORAL SYMPTOMS/MOOD NEUROLOGICAL BOWEL  NUTRITION STATUS  (none) (none) Incontinent Diet(2gm na)  AMBULATORY STATUS COMMUNICATION OF NEEDS Skin   Extensive Assist Verbally PU Stage and Appropriate Care                       Personal Care Assistance Level of Assistance  Dressing, Bathing Bathing Assistance: Maximum assistance Feeding assistance: Limited assistance Dressing Assistance: Maximum assistance     Functional Limitations Info  Hearing   Hearing Info: Impaired      SPECIAL CARE FACTORS FREQUENCY  PT (By licensed PT)                    Contractures Contractures Info: Not present    Additional Factors Info  Code Status Code Status Info: full             Current Medications (12/26/2017):  This is the current hospital active medication list Current Facility-Administered Medications  Medication Dose Route Frequency Provider Last Rate Last Dose  . acetaminophen (TYLENOL) tablet 650 mg  650 mg Oral Q6H PRN Dustin Flock, MD   650 mg at 12/24/17 1635   Or  . acetaminophen (TYLENOL) suppository 650 mg  650 mg Rectal Q6H PRN Dustin Flock, MD      . acetaminophen (TYLENOL) tablet 650 mg  650 mg Oral Q6H PRN Dustin Flock, MD      . amLODipine (NORVASC) tablet 10 mg  10 mg Oral Daily Dustin Flock, MD   10 mg at 12/26/17 1036  . atenolol (TENORMIN) tablet 25 mg  25 mg Oral Daily Dustin Flock, MD  25 mg at 12/26/17 1036  . calcium citrate-vitamin D 500-500 MG-UNIT per chewable tablet 1 tablet  1 tablet Oral Daily Dustin Flock, MD   1 tablet at 12/24/17 1817  . cefTRIAXone (ROCEPHIN) 1 g in sodium chloride 0.9 % 100 mL IVPB  1 g Intravenous Q24H Dustin Flock, MD   Stopped at 12/25/17 2000  . cholecalciferol (VITAMIN D) tablet 1,000 Units  1,000 Units Oral Daily Dustin Flock, MD   1,000 Units at 12/26/17 1022  . enoxaparin (LOVENOX) injection 40 mg  40 mg Subcutaneous Q24H Dustin Flock, MD   40 mg at 12/25/17 1726  . ferrous sulfate tablet 325 mg  325 mg Oral BID Dustin Flock,  MD   325 mg at 12/26/17 1022  . furosemide (LASIX) tablet 20 mg  20 mg Oral Daily Dustin Flock, MD   20 mg at 12/26/17 1022  . gabapentin (NEURONTIN) capsule 300 mg  300 mg Oral TID Dustin Flock, MD   300 mg at 12/26/17 1021  . hydrOXYzine (ATARAX/VISTARIL) tablet 10 mg  10 mg Oral TID PRN Dustin Flock, MD      . losartan (COZAAR) tablet 100 mg  100 mg Oral Daily Dustin Flock, MD   100 mg at 12/26/17 1036  . multivitamin with minerals tablet 1 tablet  1 tablet Oral Daily Dustin Flock, MD   1 tablet at 12/26/17 1021  . ondansetron (ZOFRAN) tablet 4 mg  4 mg Oral Q6H PRN Dustin Flock, MD       Or  . ondansetron [facility]) injection 4 mg  4 mg Intravenous Q6H PRN Dustin Flock, MD      . oxybutynin (DITROPAN) tablet 5 mg  5 mg Oral BID Dustin Flock, MD   5 mg at 12/26/17 1022  . pantoprazole (PROTONIX) EC tablet 40 mg  40 mg Oral Daily Dustin Flock, MD   40 mg at 12/26/17 1022     Discharge Medications: Please see discharge summary for a list of discharge medications.  Relevant Imaging Results:  Relevant Lab Results:   Additional Information ss: [id number]  Shela Leff, LCSW

## 2017-12-26 NOTE — Progress Notes (Addendum)
[place] at [place] NAME: [name]    MR#:  [id number]  DATE OF BIRTH:  03/20/1931  SUBJECTIVE:   Patient with dementia. Patient had fever last night.  REVIEW OF SYSTEMS:    Patient with dementia unable to obtain   Tolerating Diet: yes      DRUG ALLERGIES:  No Known Allergies  VITALS:  Blood pressure (!) 128/53, pulse 86, temperature 99.5 F (37.5 C), temperature source Oral, resp. rate 18, height 5\' 3"  (1.6 m), weight 57 kg (125 lb 9.6 oz), SpO2 99 %.  PHYSICAL EXAMINATION:  Constitutional: Appears well-developed and well-nourished. No distress. HENT: Normocephalic. Marland Kitchen Oropharynx is clear and moist.  Eyes: Conjunctivae and EOM are normal. PERRLA, no scleral icterus.  Neck: Normal ROM. Neck supple. No JVD. No tracheal deviation. CVS: RRR, S1/S2 +, no murmurs, no gallops, no carotid bruit.  Pulmonary: Effort and breath sounds normal, no stridor, rhonchi, wheezes, rales.  Abdominal: Soft. BS +,  no distension, tenderness, rebound or guarding.  Musculoskeletal: Normal range of motion. No edema and no tenderness.  Neuro: Alert. CN 2-12 grossly intact. No focal deficits. Skin: sacral pressure injury and heel wounds Psychiatric: Normal mood and affect.      LABORATORY PANEL:   CBC Recent Labs  Lab 12/25/17 0717  WBC 12.7*  HGB 10.4*  HCT 31.3*  PLT 229   ------------------------------------------------------------------------------------------------------------------  Chemistries  Recent Labs  Lab 12/24/17 1225 12/25/17 0717  NA 135 136  K 3.5 3.4*  CL 94* 97*  CO2 29 29  GLUCOSE 100* 112*  BUN 24* 19  CREATININE 1.10* 0.79  CALCIUM 9.6 9.2  AST 21  --   ALT 11*  --   ALKPHOS 108  --   BILITOT 1.1  --    ------------------------------------------------------------------------------------------------------------------  Cardiac Enzymes No results for input(s): TROPONINI in the last 168  hours. ------------------------------------------------------------------------------------------------------------------  RADIOLOGY:  Dg Chest 1 View  Result Date: 12/26/2017 CLINICAL DATA:  Fever. EXAM: CHEST 1 VIEW COMPARISON:  12/03/2017 FINDINGS: The cardiac silhouette is borderline enlarged. Atherosclerotic calcification and mild tortuosity are noted involving the thoracic aorta. No airspace consolidation, edema, pleural effusion, or pneumothorax is identified. Old left rib fractures are noted. IMPRESSION: No active disease. Electronically Signed   By: Logan Bores M.D.   On: 12/26/2017 09:55     ASSESSMENT AND PLAN:   84 year old female with history of chronic kidney disease stage III, essential hypertension and he mentioned who presents with altered mental status.  1. Acute encephalopathy on chronic dementia due to urinary tract infection Patient appears to be at baseline  2. Urinary tract infection: Continue Rocephin and follow up on urine culture Due to fever last night at order chest x-ray which shows no active disease.  3. Acute on Chronic kidney disease stage III: Creatinine now is at baseline  4. Hypokalemia: Replete and recheck in a.m.  5. Pressure injury: WOUND care consult requested  6. Essential hypertension: Continue Norvasc, atenolol, losartan  7. Chronic diastolic heart failure: Continue Lasix  8. 1/2 GPC bacteremia:likely contaminant. Repeat blood cultures.  CODE STATUS: FULL  TOTAL TIME TAKING CARE OF THIS PATIENT: 28 minutes.     POSSIBLE D/C tomorrow, DEPENDING ON CLINICAL CONDITION.   Annarae Macnair M.D on 12/26/2017 at 11:18 AM  Between 7am to 6pm - Pager - [phone] After 6pm go to [email or web address] - password [email or web address] [place] [facility]  Office  [phone]  CC: Primary care physician; Juluis Pitch, MD  Note: This dictation was prepared with Dragon dictation along with smaller phrase technology. Any transcriptional errors  that result from this process are unintentional.

## 2017-12-26 NOTE — Progress Notes (Signed)
Dr. Jannifer Franklin notified of patient temp of 100.9. Tylenol 650mg  administered. Will continue to monitor.

## 2017-12-26 NOTE — Clinical Social Work Note (Signed)
Patient's sister, [name], was still in patient's room this afternoon and CSW extended the two bed offers. Patient and sister chose [facility]. CSW has notified [facility]. Shela Leff MSW,LCSW [phone]

## 2017-12-26 NOTE — Clinical Social Work Note (Signed)
Clinical Social Work Assessment  Patient Details  Name: [name] MRN: [id number] Date of Birth: 1931/05/08  Date of referral:  12/26/17               Reason for consult:  Discharge Planning                Permission sought to share information with:  Facility Sport and exercise psychologist, Family Supports Permission granted to share information::  Yes, Verbal Permission Granted  Name::        Agency::     Relationship::     Contact Information:     Housing/Transportation Living arrangements for the past 2 months:  [place] of Information:  Patient, Siblings, Other (Comment Required)(niece: [name]: [phone]) Patient Interpreter Needed:  None Criminal Activity/Legal Involvement Pertinent to Current Situation/Hospitalization:  No - Comment as needed Significant Relationships:  Siblings, Other Family Members Lives with:  (niece lives with patient) Do you feel safe going back to the place where you live?  Yes Need for family participation in patient care:  Yes (Comment)  Care giving concerns:  Patient's niece: [name] is living with patient.   Social Worker assessment / plan:  CSW spoke with patient and her niece this morning regarding PT recommendations for STR. Patient was discharged the day before admission to the hospital from [facility]. [name] at [facility] stated that they will not be taking back and that patient was not progressing with PT.   CSW explained role and purpose of visit. Patient was somewhat confused and couldn't remember that she had been in [facility]. Patient's niece asked to speak with me outside of patient's room. She began crying and stating that the family is not pulling their weight and they are not getting things done and leaving it all for her, the niece to do. She stated that she has been trying to straighten out patient's bills and wants to take care of patient but is getting concerned she will need help. CSW provided  supportive listening while she processed her current emotions regarding patient's medical condition and disposition. She reports that patient's sister, [name], is patient's contact for decisions.   [name] came to visit patient this afternoon and CSW spoke with [name] who verbalized understanding that if patient's insurance does not authorize patient to go to STR, that patient will need to return home with the home health services (through [facility]) that were arranged by [facility]. CSW spoke with [name] regarding the urgency of needing to get patient's medicaid application in as well.   Bed search initiated.  Employment status:  Retired Nurse, adult PT Recommendations:  [place] / Referral to community resources:     Patient/Family's Response to care:  Patient, niece, and sister expressed appreciation for CSW visit.  Patient/Family's Understanding of and Emotional Response to Diagnosis, Current Treatment, and Prognosis:  Patient is agreeable to what is recommended and family is wanting to do what is best for patient and for them.  Emotional Assessment Appearance:  Appears stated age Attitude/Demeanor/Rapport:  (pleasant and cooperative) Affect (typically observed):  Calm Orientation:  Oriented to Place, Oriented to Self Alcohol / Substance use:  Not Applicable Psych involvement (Current and /or in the community):  No (Comment)  Discharge Needs  Concerns to be addressed:  Care Coordination Readmission within the last 30 days:  Yes Current discharge risk:  None Barriers to Discharge:  No Barriers Identified   Danielle Leff, LCSW 12/26/2017, 2:17 PM

## 2017-12-26 NOTE — Progress Notes (Signed)
Physical Therapy Treatment Patient Details Name: [name] MRN: [id number] DOB: 06/04/1931 Today's Date: 12/26/2017    History of Present Illness 84 y/o female that presents with UTI after just having left SNF, now confused with elevated lactic acid, low vision and history of CKD and UTI.  Pt does report she was home with a person whom she cannot identify.    PT Comments    Pt alert/oriented only to self. Pt agreeable only to supine bed exercises despite encouragement to sit edge of bed/chair several times. Pt participates with supine exercises with assist needed L > R; L side with decreased range/strength throughout. Pressure boots brought in during session and placed at end of session. Continue PT to encourage greater participation and progress range/strength throughout LEs to improve/return to prior level of functional mobility.    Follow Up Recommendations  SNF     Equipment Recommendations  None recommended by PT    Recommendations for Other Services       Precautions / Restrictions Precautions Precautions: Fall Restrictions Weight Bearing Restrictions: No    Mobility  Bed Mobility               General bed mobility comments: Not tested; pt refuses  Transfers                    Ambulation/Gait             General Gait Details: Non ambulatory   Stairs            Wheelchair Mobility    Modified Rankin (Stroke Patients Only)       Balance                                            Cognition Arousal/Alertness: Awake/alert Behavior During Therapy: Flat affect Overall Cognitive Status: No family/caregiver present to determine baseline cognitive functioning                                 General Comments: Alert and oriented to self only. Follows instructions with verbal and tactile cues. Verbalizes very little only to answer questions briefly      Exercises General Exercises - Lower  Extremity Ankle Circles/Pumps: AROM;Both;15 reps;Supine(poor range especially L) Quad Sets: Strengthening;Both;10 reps;Supine Gluteal Sets: Other (comment)(attempted; does not follow) Short Arc Quad: AROM;AAROM;Both;10 reps;Supine(assist on L) Heel Slides: AAROM;Supine;AROM;Both;10 reps(assist on L) Hip ABduction/ADduction: AAROM;Both;10 reps;Supine Straight Leg Raises: Strengthening;AAROM;Both;10 reps;Supine(assist on L)    General Comments        Pertinent Vitals/Pain Pain Assessment: No/denies pain    Home Living                      Prior Function            PT Goals (current goals can now be found in the care plan section) Progress towards PT goals: Not progressing toward goals - comment    Frequency    Min 2X/week      PT Plan Current plan remains appropriate    Co-evaluation              AM-PAC PT "6 Clicks" Daily Activity  Outcome Measure  Difficulty turning over in bed (including adjusting bedclothes, sheets and blankets)?: Unable Difficulty moving from lying on back to sitting  on the side of the bed? : Unable Difficulty sitting down on and standing up from a chair with arms (e.g., wheelchair, bedside commode, etc,.)?: Unable Help needed moving to and from a bed to chair (including a wheelchair)?: A Lot Help needed walking in hospital room?: Total Help needed climbing 3-5 steps with a railing? : Total 6 Click Score: 7    End of Session   Activity Tolerance: Other (comment)(self limiting) Patient left: in bed;with call bell/phone within reach;with bed alarm set;Other (comment)(pressure boots placed)   PT Visit Diagnosis: Muscle weakness (generalized) (M62.81);Difficulty in walking, not elsewhere classified (R26.2);Other abnormalities of gait and mobility (R26.89);Adult, failure to thrive (R62.7)     Time: 1121-1140 PT Time Calculation (min) (ACUTE ONLY): 19 min  Charges:  $Therapeutic Exercise: 8-22 mins                    G Codes:         Larae Grooms, PTA 12/26/2017, 11:48 AM

## 2017-12-26 NOTE — Consult Note (Signed)
[facility] Nurse wound consult note Reason for Consult: Consult requested for bilat heels and buttocks. Lower buttocks area with pink dry scar tissue from previous wounds which have healed. Wound type: Bilat heels are 50% stage 2 blisters which are beginning to rupture and peel, 50% dark purple deep tissue injury. Pt has foot deformities which make it difficult to reduce pressure from the affected area. Pressure Injury POA: Yes Measurement: Left heel 5X5cm Right heel 4X5cm Both with mod amt tan drainage, no odor. Dressing procedure/placement/frequency: Float bilat heels to reduce pressure using Prevalon boots to reduce pressure, foam dressings to protect from further injury and absorb drainage. No family present to discuss plan of care. Please re-consult if further assistance is needed.  Thank-you,  Julien Girt MSN, [place], [place], [place], [place]

## 2017-12-27 LAB — CBC
HEMATOCRIT: 28.2 % — AB (ref 35.0–47.0)
Hemoglobin: 9.1 g/dL — ABNORMAL LOW (ref 12.0–16.0)
MCH: 29.4 pg (ref 26.0–34.0)
MCHC: 32.3 g/dL (ref 32.0–36.0)
MCV: 90.9 fL (ref 80.0–100.0)
PLATELETS: 245 10*3/uL (ref 150–440)
RBC: 3.1 MIL/uL — ABNORMAL LOW (ref 3.80–5.20)
RDW: 15.1 % — AB (ref 11.5–14.5)
WBC: 6.7 10*3/uL (ref 3.6–11.0)

## 2017-12-27 LAB — BASIC METABOLIC PANEL
Anion gap: 10 (ref 5–15)
BUN: 16 mg/dL (ref 6–20)
CALCIUM: 8.9 mg/dL (ref 8.9–10.3)
CO2: 25 mmol/L (ref 22–32)
Chloride: 103 mmol/L (ref 101–111)
Creatinine, Ser: 0.93 mg/dL (ref 0.44–1.00)
GFR calc Af Amer: >60 mL/min (ref >60)
GFR, EST NON AFRICAN AMERICAN: 55 mL/min — AB (ref >60)
GLUCOSE: 95 mg/dL (ref 65–99)
Potassium: 3.4 mmol/L — ABNORMAL LOW (ref 3.5–5.1)
Sodium: 138 mmol/L (ref 135–145)

## 2017-12-27 LAB — CULTURE, BLOOD (ROUTINE X 2)

## 2017-12-27 LAB — C DIFFICILE QUICK SCREEN W PCR REFLEX
C Diff antigen: POSITIVE — AB
C Diff toxin: NEGATIVE

## 2017-12-27 LAB — CLOSTRIDIUM DIFFICILE BY PCR, REFLEXED: Toxigenic C. Difficile by PCR: POSITIVE — AB

## 2017-12-27 MED ORDER — POTASSIUM CHLORIDE CRYS ER 20 MEQ PO TBCR
40.0000 meq | EXTENDED_RELEASE_TABLET | Freq: Once | ORAL | Status: AC
  Administered 2017-12-27: 40 meq via ORAL
  Filled 2017-12-27: qty 2

## 2017-12-27 MED ORDER — VANCOMYCIN 50 MG/ML ORAL SOLUTION
125.0000 mg | Freq: Four times a day (QID) | ORAL | Status: DC
  Administered 2017-12-27 – 2017-12-29 (×8): 125 mg via ORAL
  Filled 2017-12-27 (×9): qty 2.5

## 2017-12-27 NOTE — Care Management Important Message (Signed)
Important Message  Patient Details  Name: [name] MRN: [id number] Date of Birth: [birth year]   Medicare Important Message Given:  Yes    Beverly Sessions, RN 12/27/2017, 4:17 PM

## 2017-12-27 NOTE — Progress Notes (Addendum)
[place] at [place] NAME: [name]    MR#:  [id number]  DATE OF BIRTH:  November 04, 1930  SUBJECTIVE:   Patient with dementia. Patient had fever again last night.  Suprapubic catheter was leaking. REVIEW OF SYSTEMS:    Patient with dementia unable to obtain   Tolerating Diet: yes      DRUG ALLERGIES:  No Known Allergies  VITALS:  Blood pressure (!) 118/55, pulse 70, temperature 98.7 F (37.1 C), temperature source Oral, resp. rate 17, height 5\' 3"  (1.6 m), weight 57 kg (125 lb 9.6 oz), SpO2 98 %.  PHYSICAL EXAMINATION:  Constitutional: Appears well-developed and well-nourished. No distress. HENT: Normocephalic. Marland Kitchen Oropharynx is clear and moist.  Eyes: Conjunctivae and EOM are normal. PERRLA, no scleral icterus.  Neck: Normal ROM. Neck supple. No JVD. No tracheal deviation. CVS: RRR, S1/S2 +, no murmurs, no gallops, no carotid bruit.  Pulmonary: Effort and breath sounds normal, no stridor, rhonchi, wheezes, rales.  Abdominal: Soft. BS +,  no distension, tenderness, rebound or guarding.  Musculoskeletal: Normal range of motion. No edema and no tenderness.  Neuro: Alert. CN 2-12 grossly intact. No focal deficits. Skin: sacral pressure injury and heel wounds Psychiatric: Normal mood and affect.      LABORATORY PANEL:   CBC Recent Labs  Lab 12/27/17 0441  WBC 6.7  HGB 9.1*  HCT 28.2*  PLT 245   ------------------------------------------------------------------------------------------------------------------  Chemistries  Recent Labs  Lab 12/24/17 1225  12/27/17 0441  NA 135   < > 138  K 3.5   < > 3.4*  CL 94*   < > 103  CO2 29   < > 25  GLUCOSE 100*   < > 95  BUN 24*   < > 16  CREATININE 1.10*   < > 0.93  CALCIUM 9.6   < > 8.9  AST 21  --   --   ALT 11*  --   --   ALKPHOS 108  --   --   BILITOT 1.1  --   --    < > = values in this interval not displayed.    ------------------------------------------------------------------------------------------------------------------  Cardiac Enzymes No results for input(s): TROPONINI in the last 168 hours. ------------------------------------------------------------------------------------------------------------------  RADIOLOGY:  Dg Chest 1 View  Result Date: 12/26/2017 CLINICAL DATA:  Fever. EXAM: CHEST 1 VIEW COMPARISON:  12/03/2017 FINDINGS: The cardiac silhouette is borderline enlarged. Atherosclerotic calcification and mild tortuosity are noted involving the thoracic aorta. No airspace consolidation, edema, pleural effusion, or pneumothorax is identified. Old left rib fractures are noted. IMPRESSION: No active disease. Electronically Signed   By: Logan Bores M.D.   On: 12/26/2017 09:55     ASSESSMENT AND PLAN:   84 year old female with history of chronic kidney disease stage III, essential hypertension and he mentioned who presents with altered mental status.  1. Acute encephalopathy on chronic dementia due to urinary tract infection Patient appears to be at baseline  2. Urinary tract infection with chronic SPC: Continue Rocephin and follow up on urine culture. Past urine cx have been pansensitive. Nurse to replace SPC. Due to fevers chest x-ray was ordered which shows no active disease.  3. Acute on Chronic kidney disease stage III: Creatinine now is at baseline  4. Hypokalemia: Resolved  5. Pressure injury: WOUND care consult appreciated.  Float bilat heels to reduce pressure using Prevalon boots to reduce pressure, foam dressings to protect from further injury and absorb drainage  6. Essential  hypertension: Continue Norvasc, atenolol, losartan  7. Chronic diastolic heart failure: Continue Lasix  8. 1/2 GPC bacteremia this is coag negative staph which is a contaminant. Repeat blood cultures are negative.   CODE STATUS: FULL  TOTAL TIME TAKING CARE OF THIS PATIENT: 25 minutes.    Patient will be discharged to skilled nursing facility once medically stable.  POSSIBLE D/C tomorrow, DEPENDING ON CLINICAL CONDITION.   Heavyn Yearsley M.D on 12/27/2017 at 10:37 AM  Between 7am to 6pm - Pager - [phone] After 6pm go to [email or web address] - password [email or web address] [facility]  Office  [phone]  CC: Primary care physician; Juluis Pitch, MD  Note: This dictation was prepared with Dragon dictation along with smaller phrase technology. Any transcriptional errors that result from this process are unintentional.

## 2017-12-27 NOTE — Progress Notes (Signed)
Pt was only able to swallow 1 teaspoon of her crushed 10 am meds. She stated she did not feel like taking any more that she was tired and wanted to sleep. She received a visitor at that time and she told her she did not want any company that she did not feel well.

## 2017-12-27 NOTE — Clinical Social Work Note (Signed)
CSW received auth for SNF from [facility]: [phone]. CSW has called Liliane Channel at [facility] and informed him of the authorization information. Physician states patient may discharge to [facility] tomorrow. CSW has contacted patient's sister, [name], and informed her of the above information. Shela Leff MSW,LCSW [phone]

## 2017-12-28 LAB — URINE CULTURE: Culture: 70000 — AB

## 2017-12-28 MED ORDER — CIPROFLOXACIN HCL 500 MG PO TABS
500.0000 mg | ORAL_TABLET | Freq: Two times a day (BID) | ORAL | Status: DC
  Administered 2017-12-28 – 2017-12-29 (×3): 500 mg via ORAL
  Filled 2017-12-28 (×3): qty 1

## 2017-12-28 NOTE — Progress Notes (Signed)
[place] at [place] NAME: [name]    MR#:  [id number]  DATE OF BIRTH:  Apr 30, 1931  SUBJECTIVE:   Patient had fever last night. She tested positive for C. difficile. Vancomycin has been started. Urine culture showing Pseudomonas. REVIEW OF SYSTEMS:    Patient with dementia unable to obtain   Tolerating Diet: yes      DRUG ALLERGIES:  No Known Allergies  VITALS:  Blood pressure (!) 130/57, pulse 75, temperature 98.8 F (37.1 C), temperature source Oral, resp. rate 18, height 5\' 3"  (1.6 m), weight 57 kg (125 lb 9.6 oz), SpO2 98 %.  PHYSICAL EXAMINATION:  Constitutional: Appears well-developed and well-nourished. No distress. HENT: Normocephalic. Marland Kitchen Oropharynx is clear and moist.  Eyes: Conjunctivae and EOM are normal. PERRLA, no scleral icterus.  Neck: Normal ROM. Neck supple. No JVD. No tracheal deviation. CVS: RRR, S1/S2 +, no murmurs, no gallops, no carotid bruit.  Pulmonary: Effort and breath sounds normal, no stridor, rhonchi, wheezes, rales.  Abdominal: Soft. BS +,  no distension, tenderness, rebound or guarding.  Musculoskeletal: Normal range of motion. No edema and no tenderness.  Neuro: Alert. CN 2-12 grossly intact. No focal deficits. Skin: sacral pressure injury and heel wounds Psychiatric: Normal mood and affect.      LABORATORY PANEL:   CBC Recent Labs  Lab 12/27/17 0441  WBC 6.7  HGB 9.1*  HCT 28.2*  PLT 245   ------------------------------------------------------------------------------------------------------------------  Chemistries  Recent Labs  Lab 12/24/17 1225  12/27/17 0441  NA 135   < > 138  K 3.5   < > 3.4*  CL 94*   < > 103  CO2 29   < > 25  GLUCOSE 100*   < > 95  BUN 24*   < > 16  CREATININE 1.10*   < > 0.93  CALCIUM 9.6   < > 8.9  AST 21  --   --   ALT 11*  --   --   ALKPHOS 108  --   --   BILITOT 1.1  --   --    < > = values in this interval not displayed.    ------------------------------------------------------------------------------------------------------------------  Cardiac Enzymes No results for input(s): TROPONINI in the last 168 hours. ------------------------------------------------------------------------------------------------------------------  RADIOLOGY:  No results found.   ASSESSMENT AND PLAN:   84 year old female with history of chronic kidney disease stage III, essential hypertension and he mentioned who presents with altered mental status.  1. Acute encephalopathy on chronic dementia due to Pseudomonas urinary tract infection Patient appears to be at baseline  2. Pseudomonas Urinary tract infection with chronic SPC:  Patient had fever of last night probably due to the fact that Rocephin does not cover pseudomonas.  Changed to oral ciprofloxacin as per sensitivities 3. Acute on Chronic kidney disease stage III: Creatinine now is at baseline  4. Hypokalemia: Resolved  5. Pressure injury: WOUND care consult appreciated.  Float bilat heels to reduce pressure using Prevalon boots to reduce pressure, foam dressings to protect from further injury and absorb drainage  6. Essential hypertension: Continue Norvasc, atenolol, losartan  7. Chronic diastolic heart failure: Continue Lasix  8. 1/2 GPC bacteremia this is coag negative staph which is a contaminant. Repeat blood cultures are negative. 9. C. difficile diarrhea: Patient started on vancomycin  CODE STATUS: FULL  TOTAL TIME TAKING CARE OF THIS PATIENT: 25 minutes.   Patient will be discharged to skilled nursing facility once afebrile.  POSSIBLE  D/C tomorrow, DEPENDING ON CLINICAL CONDITION.   [name] M.D on 12/28/2017 at 11:47 AM  Between 7am to 6pm - Pager - [phone] After 6pm go to [email or web address] - password [email or web address] [facility]  Office  [phone]  CC: Primary care physician; Juluis Pitch, MD  Note: This dictation  was prepared with Dragon dictation along with smaller phrase technology. Any transcriptional errors that result from this process are unintentional.

## 2017-12-28 NOTE — Progress Notes (Signed)
PT Cancellation Note  Patient Details Name: [name] MRN: [id number] DOB: 01-05-1931   Cancelled Treatment:    Reason Eval/Treat Not Completed: Patient declined, no reason specified. Treatment attempted; pt refused/turns away from therapist. Re attempt at a later time/date.   Larae Grooms, PTA 12/28/2017, 12:04 PM

## 2017-12-28 NOTE — Progress Notes (Signed)
Pharmacy Antibiotic Note  [name] is a 84 y.o. female admitted on 12/24/2017 with UTI.  Pharmacy has been consulted for oral ciprofloxacin dosing.  Plan: ciprofloxacin 500mg  po bid x 6 days   Discontinue ceftriaxone, pseudomonal UTI per sensitivities   Height: 5\' 3"  (160 cm) Weight: 125 lb 9.6 oz (57 kg) IBW/kg (Calculated) : 52.4  Temp (24hrs), Avg:99.9 F (37.7 C), Min:98.8 F (37.1 C), Max:101.4 F (38.6 C)  Recent Labs  Lab 12/24/17 1225 12/24/17 1638 12/25/17 0717 12/26/17 0927 12/26/17 1232 12/27/17 0441  WBC 11.7*  --  12.7*  --   --  6.7  CREATININE 1.10*  --  0.79  --   --  0.93  LATICACIDVEN 1.3 3.0*  --  0.8 1.7  --     Estimated Creatinine Clearance: 37.2 mL/min (by C-G formula based on SCr of 0.93 mg/dL).    No Known Allergies  Antimicrobials this admission: Anti-infectives (From admission, onward)   Start     Dose/Rate Route Frequency Ordered Stop   12/28/17 1230  ciprofloxacin (CIPRO) tablet 500 mg     500 mg Oral 2 times daily 12/28/17 1222 01/03/18 0759   12/27/17 1800  vancomycin (VANCOCIN) 50 mg/mL oral solution 125 mg     125 mg Oral Every 6 hours 12/27/17 1650     12/25/17 1800  cefTRIAXone (ROCEPHIN) 1 g in sodium chloride 0.9 % 100 mL IVPB  Status:  Discontinued     1 g 200 mL/hr over 30 Minutes Intravenous Every 24 hours 12/24/17 1418 12/28/17 1222   12/24/17 1430  cefTRIAXone (ROCEPHIN) 1 g in sodium chloride 0.9 % 100 mL IVPB  Status:  Discontinued     1 g 200 mL/hr over 30 Minutes Intravenous Every 24 hours 12/24/17 1417 12/24/17 1418   12/24/17 1315  cefTRIAXone (ROCEPHIN) 1 g in sodium chloride 0.9 % 100 mL IVPB     1 g 200 mL/hr over 30 Minutes Intravenous  Once 12/24/17 1303 12/24/17 1403       Microbiology results: Recent Results (from the past 240 hour(s))  Blood Culture (routine x 2)     Status: None (Preliminary result)   Collection Time: 12/24/17  1:06 PM  Result Value Ref Range Status   Specimen Description BLOOD RT [facility]   Final   Special Requests   Final    BOTTLES DRAWN AEROBIC AND ANAEROBIC Blood Culture adequate volume   Culture   Final    NO GROWTH 4 DAYS Performed at [facility], [address], [place], [place] [address]    Report Status PENDING  Incomplete  Blood Culture (routine x 2)     Status: Abnormal   Collection Time: 12/24/17  1:15 PM  Result Value Ref Range Status   Specimen Description   Final    BLOOD RT [facility] Performed at [facility], [address] [place], [place] [address]    Special Requests   Final    BOTTLES DRAWN AEROBIC AND ANAEROBIC Blood Culture results may not be optimal due to an inadequate volume of blood received in culture bottles Performed at [facility], [address] [place], [place] [address]    Culture  Setup Time   Final    GRAM POSITIVE COCCI AEROBIC BOTTLE ONLY CRITICAL RESULT CALLED TO, READ BACK BY AND VERIFIED WITH: SCOTT CHRISTY AT 0175 12/25/17.MSS    Culture (A)  Final    STAPHYLOCOCCUS SPECIES (COAGULASE NEGATIVE) THE SIGNIFICANCE OF ISOLATING THIS ORGANISM FROM A  SINGLE SET OF BLOOD CULTURES WHEN MULTIPLE SETS ARE DRAWN IS UNCERTAIN. PLEASE NOTIFY THE MICROBIOLOGY DEPARTMENT WITHIN ONE WEEK IF SPECIATION AND SENSITIVITIES ARE REQUIRED. Performed at [facility], [place] [address], [place], [place] [address]    Report Status 12/27/2017 FINAL  Final  Blood Culture ID Panel (Reflexed)     Status: None   Collection Time: 12/24/17  1:15 PM  Result Value Ref Range Status   Enterococcus species NOT DETECTED NOT DETECTED Final   Listeria monocytogenes NOT DETECTED NOT DETECTED Final   Staphylococcus species NOT DETECTED NOT DETECTED Final   Staphylococcus aureus NOT DETECTED NOT DETECTED Final   Streptococcus species NOT DETECTED NOT DETECTED Final   Streptococcus agalactiae NOT DETECTED NOT DETECTED Final   Streptococcus pneumoniae NOT DETECTED NOT DETECTED Final   Streptococcus pyogenes NOT DETECTED NOT DETECTED  Final   Acinetobacter baumannii NOT DETECTED NOT DETECTED Final   Enterobacteriaceae species NOT DETECTED NOT DETECTED Final   Enterobacter cloacae complex NOT DETECTED NOT DETECTED Final   Escherichia coli NOT DETECTED NOT DETECTED Final   Klebsiella oxytoca NOT DETECTED NOT DETECTED Final   Klebsiella pneumoniae NOT DETECTED NOT DETECTED Final   Proteus species NOT DETECTED NOT DETECTED Final   Serratia marcescens NOT DETECTED NOT DETECTED Final   Haemophilus influenzae NOT DETECTED NOT DETECTED Final   Neisseria meningitidis NOT DETECTED NOT DETECTED Final   Pseudomonas aeruginosa NOT DETECTED NOT DETECTED Final   Candida albicans NOT DETECTED NOT DETECTED Final   Candida glabrata NOT DETECTED NOT DETECTED Final   Candida krusei NOT DETECTED NOT DETECTED Final   Candida parapsilosis NOT DETECTED NOT DETECTED Final   Candida tropicalis NOT DETECTED NOT DETECTED Final    Comment: Performed at [facility], [address] [place], [place] [address]  Culture, blood (Routine X 2) w Reflex to ID Panel     Status: None (Preliminary result)   Collection Time: 12/26/17  9:27 AM  Result Value Ref Range Status   Specimen Description BLOOD RIGHT ANTECUBITAL  Final   Special Requests   Final    BOTTLES DRAWN AEROBIC AND ANAEROBIC Blood Culture results may not be optimal due to an inadequate volume of blood received in culture bottles   Culture   Final    NO GROWTH 2 DAYS Performed at [facility], [address], [place], [place] [address]    Report Status PENDING  Incomplete  Culture, blood (Routine X 2) w Reflex to ID Panel     Status: None (Preliminary result)   Collection Time: 12/26/17  9:37 AM  Result Value Ref Range Status   Specimen Description BLOOD RIGHT HAND  Final   Special Requests   Final    BOTTLES DRAWN AEROBIC AND ANAEROBIC Blood Culture results may not be optimal due to an inadequate volume of blood received in culture bottles   Culture   Final    NO  GROWTH 2 DAYS Performed at [facility] [address], [place], [place] [address]    Report Status PENDING  Incomplete  Urine Culture     Status: Abnormal   Collection Time: 12/26/17  2:40 PM  Result Value Ref Range Status   Specimen Description   Final    URINE, RANDOM Performed at [facility], [address] [place], [place] [address]    Special Requests   Final    NONE Performed at [facility], [address], [place], [place] [address]    Culture  70,000 COLONIES/mL PSEUDOMONAS AERUGINOSA (A)  Final   Report Status 12/28/2017 FINAL  Final   Organism ID, Bacteria PSEUDOMONAS AERUGINOSA (A)  Final      Susceptibility   Pseudomonas aeruginosa - MIC*    CEFTAZIDIME 2 SENSITIVE Sensitive     CIPROFLOXACIN <=0.25 SENSITIVE Sensitive     GENTAMICIN <=1 SENSITIVE Sensitive     IMIPENEM 2 SENSITIVE Sensitive     PIP/TAZO 8 SENSITIVE Sensitive     CEFEPIME <=1 SENSITIVE Sensitive     * 70,000 COLONIES/mL PSEUDOMONAS AERUGINOSA  C difficile quick scan w PCR reflex     Status: Abnormal   Collection Time: 12/27/17  2:40 PM  Result Value Ref Range Status   C Diff antigen POSITIVE (A) NEGATIVE Final   C Diff toxin NEGATIVE NEGATIVE Final   C Diff interpretation Results are indeterminate. See PCR results.  Final    Comment: Performed at [facility], [address] [place], [place] [address]  C. Diff by PCR, Reflexed     Status: Abnormal   Collection Time: 12/27/17  2:40 PM  Result Value Ref Range Status   Toxigenic C. Difficile by PCR POSITIVE (A) NEGATIVE Final    Comment: Positive for toxigenic C. difficile with little to no toxin production. Only treat if clinical presentation suggests symptomatic illness. Performed at [facility], [address], [place], [place] [address]     Thank you for allowing pharmacy to be a part of this patient's care.  Donna Christen Epic [place] 12/28/2017 12:22 PM

## 2017-12-29 LAB — CULTURE, BLOOD (ROUTINE X 2)
CULTURE: NO GROWTH
SPECIAL REQUESTS: ADEQUATE

## 2017-12-29 MED ORDER — ENSURE ENLIVE PO LIQD
237.0000 mL | Freq: Two times a day (BID) | ORAL | Status: DC
  Administered 2017-12-29: 237 mL via ORAL

## 2017-12-29 MED ORDER — CIPROFLOXACIN HCL 500 MG PO TABS: 500.0000 mg | ORAL_TABLET | Freq: Two times a day (BID) | ORAL | 0 refills | Status: AC

## 2017-12-29 MED ORDER — OCUVITE-LUTEIN PO CAPS
1.0000 | ORAL_CAPSULE | Freq: Every day | ORAL | Status: DC
  Filled 2017-12-29: qty 1

## 2017-12-29 MED ORDER — VANCOMYCIN 50 MG/ML ORAL SOLUTION: 125.0000 mg | Freq: Four times a day (QID) | ORAL | 0 refills | Status: AC

## 2017-12-29 NOTE — Discharge Summary (Signed)
[facility] NAME: [name]    MR#:  [id number]  DATE OF BIRTH:  09-May-1931  DATE OF ADMISSION:  12/24/2017 ADMITTING PHYSICIAN: Dustin Flock, MD  DATE OF DISCHARGE: 12/29/2017  PRIMARY CARE PHYSICIAN: Juluis Pitch, MD    ADMISSION DIAGNOSIS:  Lower urinary tract infectious disease [N39.0] Confusion [R41.0]  DISCHARGE DIAGNOSIS:  Active Problems:   Acute encephalopathy   SECONDARY DIAGNOSIS:   Past Medical History:  Diagnosis Date  . Anemia   . Chronic kidney disease, stage 3 ([place])   . Degenerative joint disease (DJD) of lumbar spine   . Diastolic dysfunction   . Dizziness   . Edema   . ETD (eustachian tube dysfunction)   . Heartburn   . History of rectal bleeding   . HTN (hypertension)   . Hypercalcemia   . Intracranial aneurysm   . Low vision, one eye   . Nephritis and nephropathy   . Osteoporosis   . Pyuria   . Urge incontinence   . Urinary frequency   . Urinary retention     HOSPITAL COURSE:  84 year old female with history of chronic kidney disease stage III, essential hypertension and he mentioned who presents with altered mental status.  1. Acute encephalopathy on chronic dementia due to Pseudomonas urinary tract infection Patient appears to be at baseline  2. Pseudomonas Urinary tract infection with chronic SPC:  Patient will continue ciprofloxacin until January 04, 2018  3. Acute on Chronic kidney disease stage III: Creatinine now is at baseline  4. Hypokalemia: Resolved  5. Pressure injury: WOUND care consult appreciated.  Float bilat heels to reduce pressure using Prevalon boots to reduce pressure, foam dressings to protect from further injury and absorb drainage  6. Essential hypertension: Continue Norvasc, atenolol, losartan  7. Chronic diastolic heart failure: Continue Lasix  8. 1/2 GPC bacteremia this is coag negative staph which is a contaminant. Repeat blood cultures are  negative.  9. C. difficile diarrhea: Patient has been started on vancomycin.  She needs 14 days of therapy.  Stop date will be March 24.     DISCHARGE CONDITIONS AND DIET:   Stable for discharge on cardiac diet  CONSULTS OBTAINED:    DRUG ALLERGIES:  No Known Allergies  DISCHARGE MEDICATIONS:   Allergies as of 12/29/2017   No Known Allergies     Medication List    STOP taking these medications   hydrochlorothiazide 25 MG tablet Commonly known as:  HYDRODIURIL   nystatin 100000 UNIT/ML suspension Commonly known as:  MYCOSTATIN   pantoprazole 40 MG tablet Commonly known as:  PROTONIX   silver sulfADIAZINE 1 % cream Commonly known as:  SILVADENE     TAKE these medications   acetaminophen 325 MG tablet Commonly known as:  TYLENOL Take 650 mg by mouth every 6 (six) hours as needed for mild pain.   amLODipine 10 MG tablet Commonly known as:  NORVASC TAKE ONE TABLET BY MOUTH EVERY DAY HIGH BLOOD PRESSURE   atenolol 25 MG tablet Commonly known as:  TENORMIN Take 1 tablet (25 mg total) by mouth daily.   CALCIUM CITRATE+D3 PETITES 200-250 MG-UNIT Tabs Generic drug:  Calcium Citrate-Vitamin D Take 1 tablet by mouth daily.   cholecalciferol 1000 units tablet Commonly known as:  VITAMIN D Take 1,000 Units by mouth daily.   ciprofloxacin 500 MG tablet Commonly known as:  CIPRO Take 1 tablet (500 mg total) by mouth 2 (two) times daily for 5 days.  feeding supplement (ENSURE ENLIVE) Liqd Take 237 mLs by mouth 3 (three) times daily between meals.   FEROSUL 325 (65 FE) MG tablet Generic drug:  ferrous sulfate Take 325 mg by mouth 2 (two) times daily.   furosemide 20 MG tablet Commonly known as:  LASIX Take 20 mg by mouth daily.   gabapentin 300 MG capsule Commonly known as:  NEURONTIN Take 300 mg by mouth 3 (three) times daily.   hydrOXYzine 10 MG tablet Commonly known as:  ATARAX/VISTARIL Take 10 mg by mouth 3 (three) times daily as needed.   losartan  100 MG tablet Commonly known as:  COZAAR Take 1 tablet (100 mg total) by mouth daily.   multivitamin tablet Take 1 tablet by mouth daily.   oxybutynin 5 MG tablet Commonly known as:  DITROPAN Take 5 mg by mouth 2 (two) times daily.   vancomycin 50 mg/mL oral solution Commonly known as:  VANCOCIN Take 2.5 mLs (125 mg total) by mouth every 6 (six) hours for 6 days.         Today   CHIEF COMPLAINT:  NO Acute issues overnight   VITAL SIGNS:  Blood pressure (!) 111/48, pulse 67, temperature 98.6 F (37 C), temperature source Oral, resp. rate 17, height 5\' 3"  (1.6 m), weight 57 kg (125 lb 9.6 oz), SpO2 99 %.   REVIEW OF SYSTEMS:  Review of Systems  Unable to perform ROS: Dementia     PHYSICAL EXAMINATION:  GENERAL:  84 y.o.-year-old patient lying in the bed with no acute distress.  NECK:  Supple, no jugular venous distention. No thyroid enlargement, no tenderness.  LUNGS: Normal breath sounds bilaterally, no wheezing, rales,rhonchi  No use of accessory muscles of respiration.  CARDIOVASCULAR: S1, S2 normal. No murmurs, rubs, or gallops.  ABDOMEN: Soft, non-tender, non-distended. Bowel sounds present. No organomegaly or mass.  EXTREMITIES: No pedal edema, cyanosis, or clubbing.  PSYCHIATRIC: The patient is alert and oriented x name and place not time.  SKIN: No obvious rash, lesion, or ulcer.   DATA REVIEW:   CBC Recent Labs  Lab 12/27/17 0441  WBC 6.7  HGB 9.1*  HCT 28.2*  PLT 245    Chemistries  Recent Labs  Lab 12/24/17 1225  12/27/17 0441  NA 135   < > 138  K 3.5   < > 3.4*  CL 94*   < > 103  CO2 29   < > 25  GLUCOSE 100*   < > 95  BUN 24*   < > 16  CREATININE 1.10*   < > 0.93  CALCIUM 9.6   < > 8.9  AST 21  --   --   ALT 11*  --   --   ALKPHOS 108  --   --   BILITOT 1.1  --   --    < > = values in this interval not displayed.    Cardiac Enzymes No results for input(s): TROPONINI in the last 168 hours.  Microbiology Results   @MICRORSLT48 @  RADIOLOGY:  No results found.    Allergies as of 12/29/2017   No Known Allergies     Medication List    STOP taking these medications   hydrochlorothiazide 25 MG tablet Commonly known as:  HYDRODIURIL   nystatin 100000 UNIT/ML suspension Commonly known as:  MYCOSTATIN   pantoprazole 40 MG tablet Commonly known as:  PROTONIX   silver sulfADIAZINE 1 % cream Commonly known as:  SILVADENE     TAKE these  medications   acetaminophen 325 MG tablet Commonly known as:  TYLENOL Take 650 mg by mouth every 6 (six) hours as needed for mild pain.   amLODipine 10 MG tablet Commonly known as:  NORVASC TAKE ONE TABLET BY MOUTH EVERY DAY HIGH BLOOD PRESSURE   atenolol 25 MG tablet Commonly known as:  TENORMIN Take 1 tablet (25 mg total) by mouth daily.   CALCIUM CITRATE+D3 PETITES 200-250 MG-UNIT Tabs Generic drug:  Calcium Citrate-Vitamin D Take 1 tablet by mouth daily.   cholecalciferol 1000 units tablet Commonly known as:  VITAMIN D Take 1,000 Units by mouth daily.   ciprofloxacin 500 MG tablet Commonly known as:  CIPRO Take 1 tablet (500 mg total) by mouth 2 (two) times daily for 5 days.   feeding supplement (ENSURE ENLIVE) Liqd Take 237 mLs by mouth 3 (three) times daily between meals.   FEROSUL 325 (65 FE) MG tablet Generic drug:  ferrous sulfate Take 325 mg by mouth 2 (two) times daily.   furosemide 20 MG tablet Commonly known as:  LASIX Take 20 mg by mouth daily.   gabapentin 300 MG capsule Commonly known as:  NEURONTIN Take 300 mg by mouth 3 (three) times daily.   hydrOXYzine 10 MG tablet Commonly known as:  ATARAX/VISTARIL Take 10 mg by mouth 3 (three) times daily as needed.   losartan 100 MG tablet Commonly known as:  COZAAR Take 1 tablet (100 mg total) by mouth daily.   multivitamin tablet Take 1 tablet by mouth daily.   oxybutynin 5 MG tablet Commonly known as:  DITROPAN Take 5 mg by mouth 2 (two) times daily.   vancomycin  50 mg/mL oral solution Commonly known as:  VANCOCIN Take 2.5 mLs (125 mg total) by mouth every 6 (six) hours for 6 days.        "}   Management plans discussed with the patient and she is in agreement. Stable for discharge SNF  Patient should follow up with pcp  CODE STATUS:     Code Status Orders  (From admission, onward)        Start     Ordered   12/24/17 1557  Full code  Continuous     12/24/17 1556    Code Status History    Date Active Date Inactive Code Status Order ID Comments User Context   12/24/2017 14:17 12/24/2017 15:56 DNR [id number]  Dustin Flock, MD ED   12/06/2017 14:06 12/06/2017 20:06 DNR [id number]  Fritzi Mandes, MD Inpatient   12/03/2017 15:06 12/06/2017 14:06 Full Code [id number]  Idelle Crouch, MD ED   03/28/2017 16:49 03/30/2017 21:57 DNR [id number]  Hillary Bow, MD ED   06/08/2016 20:40 06/11/2016 21:41 Full Code [id number]  Hower, Aaron Mose, MD ED    Advance Directive Documentation     Most Recent Value  Type of Advance Directive  Out of facility DNR (pink MOST or yellow form)  Pre-existing out of facility DNR order (yellow form or pink MOST form)  Physician notified to receive inpatient order  "MOST" Form in Place?  No data      TOTAL TIME TAKING CARE OF THIS PATIENT: 38 minutes.    Note: This dictation was prepared with Dragon dictation along with smaller phrase technology. Any transcriptional errors that result from this process are unintentional.  Jermiah Soderman M.D on 12/29/2017 at 10:13 AM  Between 7am to 6pm - Pager - [phone] After 6pm go to [email or web address] - password [email or web address]  [employer]  [phone]  CC: Primary care physician; Juluis Pitch, MD

## 2017-12-29 NOTE — Care Management (Signed)
Patient discharging to SNF,  CSW facilitating.  Brittney with [facility] notified of discharge disposition

## 2017-12-29 NOTE — Clinical Social Work Placement (Signed)
   CLINICAL SOCIAL WORK PLACEMENT  NOTE  Date:  12/29/2017  Patient Details  Name: [name] MRN: [id number] Date of Birth: 02-16-1931  Clinical Social Work is seeking post-discharge placement for this patient at the [place] level of care (*CSW will initial, date and re-position this form in  chart as items are completed):  Yes   Patient/family provided with [facility] Work Department's list of facilities offering this level of care within the geographic area requested by the patient (or if unable, by the patient's family).  Yes   Patient/family informed of their freedom to choose among providers that offer the needed level of care, that participate in Medicare, Medicaid or managed care program needed by the patient, have an available bed and are willing to accept the patient.  Yes   Patient/family informed of [facility]'s ownership interest in [facility] and [facility], as well as of the fact that they are under no obligation to receive care at these facilities.  PASRR submitted to EDS on       PASRR number received on       Existing PASRR number confirmed on 12/26/17     FL2 transmitted to all facilities in geographic area requested by pt/family on 12/26/17     FL2 transmitted to all facilities within larger geographic area on       Patient informed that his/her managed care company has contracts with or will negotiate with certain facilities, including the following:        Yes   Patient/family informed of bed offers received.  Patient chooses bed at [facility])     Physician recommends and patient chooses bed at [facility])    Patient to be transferred to [facility]) on 12/29/17.  Patient to be transferred to facility by (EMS)     Patient family notified on 12/29/17 of transfer.  Name of family member notified:  ([name]: Sister)     PHYSICIAN       Additional Comment:     _______________________________________________ Shela Leff, LCSW 12/29/2017, 11:34 AM

## 2017-12-29 NOTE — Clinical Social Work Note (Signed)
Patient to discharge to [facility] today. Rick at [facility] is aware and discharge information sent. Patient's sister, [name], is aware and is wanting EMS to transport.  Shela Leff MSW,LCSW [phone]

## 2017-12-31 LAB — CULTURE, BLOOD (ROUTINE X 2)
CULTURE: NO GROWTH
Culture: NO GROWTH

## 2018-01-09 ENCOUNTER — Ambulatory Visit: Payer: Medicare HMO | Admitting: Physician Assistant

## 2018-01-10 ENCOUNTER — Encounter: Payer: Medicare HMO | Admitting: Physician Assistant

## 2018-01-10 DIAGNOSIS — Z87891 Personal history of nicotine dependence: Secondary | ICD-10-CM | POA: Diagnosis not present

## 2018-01-10 DIAGNOSIS — N189 Chronic kidney disease, unspecified: Secondary | ICD-10-CM | POA: Diagnosis not present

## 2018-01-10 DIAGNOSIS — Z96653 Presence of artificial knee joint, bilateral: Secondary | ICD-10-CM | POA: Diagnosis not present

## 2018-01-10 DIAGNOSIS — L97512 Non-pressure chronic ulcer of other part of right foot with fat layer exposed: Secondary | ICD-10-CM | POA: Diagnosis not present

## 2018-01-10 DIAGNOSIS — I129 Hypertensive chronic kidney disease with stage 1 through stage 4 chronic kidney disease, or unspecified chronic kidney disease: Secondary | ICD-10-CM | POA: Diagnosis not present

## 2018-01-10 DIAGNOSIS — Z7401 Bed confinement status: Secondary | ICD-10-CM | POA: Diagnosis not present

## 2018-01-10 DIAGNOSIS — Z993 Dependence on wheelchair: Secondary | ICD-10-CM | POA: Diagnosis not present

## 2018-01-10 DIAGNOSIS — L89322 Pressure ulcer of left buttock, stage 2: Secondary | ICD-10-CM | POA: Diagnosis not present

## 2018-01-10 DIAGNOSIS — I83024 Varicose veins of left lower extremity with ulcer of heel and midfoot: Secondary | ICD-10-CM | POA: Diagnosis not present

## 2018-01-10 DIAGNOSIS — L89623 Pressure ulcer of left heel, stage 3: Secondary | ICD-10-CM | POA: Diagnosis not present

## 2018-01-10 DIAGNOSIS — D631 Anemia in chronic kidney disease: Secondary | ICD-10-CM | POA: Diagnosis not present

## 2018-01-10 DIAGNOSIS — L89613 Pressure ulcer of right heel, stage 3: Secondary | ICD-10-CM | POA: Diagnosis not present

## 2018-01-12 NOTE — Progress Notes (Signed)
[name] ([id number]) Visit Report for 01/10/2018 Arrival Information Details Patient Name: [name], [name]. Date of Service: 01/10/2018 2:30 PM Medical Record Number: [id number] Patient Account Number: [id number] Date of Birth/Sex: 1930-10-21 (84 y.o. Female) Treating RN: Montey Hora Primary Care Arek Spadafore: Juluis Pitch Other Clinician: Referring Kateryna Grantham: Lovie Macadamia, DAVID Treating Ahlayah Tarkowski/Extender: Melburn Hake, HOYT Weeks in Treatment: 33 Visit Information History Since Last Visit Added or deleted any medications: No Patient Arrived: Wheel Chair Any new allergies or adverse reactions: No Arrival Time: 15:01 Had a fall or experienced change in No activities of daily living that may affect Accompanied By: staff risk of falls: Transfer Assistance: Hoyer Lift Signs or symptoms of abuse/neglect since last visito No Patient Identification Verified: Yes Hospitalized since last visit: No Secondary Verification Process Completed: Yes Implantable device outside of the clinic excluding No Patient Requires Transmission-Based No cellular tissue based products placed in the center Precautions: since last visit: Patient Has Alerts: No Has Dressing in Place as Prescribed: Yes Pain Present Now: No Electronic Signature(s) Signed: 01/10/2018 4:44:49 PM By: Montey Hora Entered By: Montey Hora on 01/10/2018 15:01:43 [name] ([id number]) -------------------------------------------------------------------------------- Encounter Discharge Information Details Patient Name: [name]. Date of Service: 01/10/2018 2:30 PM Medical Record Number: [id number] Patient Account Number: [id number] Date of Birth/Sex: [birth year] (84 y.o. Female) Treating RN: Carolyne Fiscal, Debi Primary Care Thayden Lemire: Lovie Macadamia, DAVID Other Clinician: Referring Sayra Frisby: Lovie Macadamia, DAVID Treating Jamariah Tony/Extender: Melburn Hake, HOYT Weeks in Treatment: 91 Encounter Discharge Information Items Discharge Pain  Level: 0 Discharge Condition: Stable Ambulatory Status: Wheelchair Discharge Destination: Nursing Home Transportation: Other Accompanied By: caregiver Schedule Follow-up Appointment: Yes Medication Reconciliation completed and No provided to Patient/Care Keimora Swartout: Provided on Clinical Summary of Care: 01/10/2018 Form Type Recipient Paper Patient EB Electronic Signature(s) Signed: 01/11/2018 7:46:43 AM By: Roger Shelter Entered By: Roger Shelter on 01/10/2018 15:55:32 [name], [name] ([id number]) -------------------------------------------------------------------------------- Lower Extremity Assessment Details Patient Name: [name]. Date of Service: 01/10/2018 2:30 PM Medical Record Number: [id number] Patient Account Number: [id number] Date of Birth/Sex: [birth year] (84 y.o. Female) Treating RN: Montey Hora Primary Care Eden Rho: Juluis Pitch Other Clinician: Referring Valen Mascaro: Lovie Macadamia, DAVID Treating Kawana Hegel/Extender: Melburn Hake, HOYT Weeks in Treatment: 70 Vascular Assessment Pulses: Dorsalis Pedis Palpable: [Left:Yes] [Right:Yes] Posterior Tibial Extremity colors, hair growth, and conditions: Extremity Color: [Left:Hyperpigmented] [Right:Hyperpigmented] Hair Growth on Extremity: [Left:No] [Right:No] Temperature of Extremity: [Left:Warm] [Right:Warm] Capillary Refill: [Left:< 3 seconds] [Right:< 3 seconds] Toe Nail Assessment Left: Right: Thick: Yes Yes Discolored: Yes Yes Deformed: Yes Yes Improper Length and Hygiene: No No Electronic Signature(s) Signed: 01/10/2018 4:44:49 PM By: Montey Hora Entered By: Montey Hora on 01/10/2018 15:15:35 [name] ([id number]) -------------------------------------------------------------------------------- Multi Wound Chart Details Patient Name: [name]. Date of Service: 01/10/2018 2:30 PM Medical Record Number: [id number] Patient Account Number: [id number] Date of Birth/Sex: 1931/02/26 (84 y.o.  Female) Treating RN: Carolyne Fiscal, Debi Primary Care Kendry Pfarr: Lovie Macadamia, DAVID Other Clinician: Referring Everardo Voris: Lovie Macadamia, DAVID Treating Vermell Madrid/Extender: Melburn Hake, HOYT Weeks in Treatment: 70 Vital Signs Height(in): 63 Pulse(bpm): 75 Weight(lbs): 160 Blood Pressure(mmHg): 140/69 Body Mass Index(BMI): 28 Temperature(F): 98.3 Respiratory Rate 16 (breaths/min): Photos: [10:No Photos] [8:No Photos] [N/A:N/A] Wound Location: [10:Left Calcaneus] [8:Right Calcaneus] [N/A:N/A] Wounding Event: [10:Pressure Injury] [8:Pressure Injury] [N/A:N/A] Primary Etiology: [10:Pressure Ulcer] [8:Pressure Ulcer] [N/A:N/A] Comorbid History: [10:Cataracts, Hypertension, Peripheral Venous Disease] [8:Cataracts, Hypertension, Peripheral Venous Disease] [N/A:N/A] Date Acquired: [10:12/26/2017] [8:09/15/2017] [N/A:N/A] Weeks of Treatment: [10:0] [8:14] [N/A:N/A] Wound Status: [10:Open] [8:Open] [N/A:N/A] Measurements L x W x D [10:3.5x4.5x0.2] [8:3.8x7x0.1] [N/A:N/A] (cm) Area (cm) : [10:12.37] [  8:20.892] [N/A:N/A] Volume (cm) : [10:2.474] [8:2.089] [N/A:N/A] % Reduction in Area: [10:N/A] [8:-720.90%] [N/A:N/A] % Reduction in Volume: [10:N/A] [8:-310.40%] [N/A:N/A] Classification: [10:Category/Stage III] [8:Category/Stage III] [N/A:N/A] Exudate Amount: [10:Large] [8:None Present] [N/A:N/A] Exudate Type: [10:Serosanguineous] [8:N/A] [N/A:N/A] Exudate Color: [10:red, brown] [8:N/A] [N/A:N/A] Wound Margin: [10:Flat and Intact] [8:Flat and Intact] [N/A:N/A] Granulation Amount: [10:Small (1-33%)] [8:Small (1-33%)] [N/A:N/A] Granulation Quality: [10:Pink] [8:Pink] [N/A:N/A] Necrotic Amount: [10:Large (67-100%)] [8:Large (67-100%)] [N/A:N/A] Necrotic Tissue: [10:Eschar, Adherent Slough] [8:Eschar, Adherent Slough] [N/A:N/A] Exposed Structures: [10:Fat Layer (Subcutaneous Tissue) Exposed: Yes Fascia: No Tendon: No Muscle: No Joint: No Bone: No] [8:Fat Layer (Subcutaneous Tissue) Exposed: Yes Fascia:  No Tendon: No Muscle: No Joint: No Bone: No] [N/A:N/A] Epithelialization: [10:None] [8:None] [N/A:N/A] Periwound Skin Texture: [10:Excoriation: No Induration: No Callus: No Crepitus: No] [8:Excoriation: No Induration: No Callus: No Crepitus: No] [N/A:N/A] Rash: No Rash: No Scarring: No Scarring: No Periwound Skin Moisture: Maceration: No Maceration: No N/A Dry/Scaly: No Dry/Scaly: No Periwound Skin Color: Atrophie Blanche: No Atrophie Blanche: No N/A Cyanosis: No Cyanosis: No Ecchymosis: No Ecchymosis: No Erythema: No Erythema: No Hemosiderin Staining: No Hemosiderin Staining: No Mottled: No Mottled: No Pallor: No Pallor: No Rubor: No Rubor: No Temperature: No Abnormality No Abnormality N/A Tenderness on Palpation: No No N/A Wound Preparation: Ulcer Cleansing: Ulcer Cleansing: N/A Rinsed/Irrigated with Saline Rinsed/Irrigated with Saline Topical Anesthetic Applied: Topical Anesthetic Applied: Other: lidocaine 4% Other: lidocaine 4% Treatment Notes Electronic Signature(s) Signed: 01/11/2018 4:37:47 PM By: Alric Quan Entered By: Alric Quan on 01/10/2018 15:22:36 [name] ([id number]) -------------------------------------------------------------------------------- [place] Details Patient Name: [name] Date of Service: 01/10/2018 2:30 PM Medical Record Number: [id number] Patient Account Number: [id number] Date of Birth/Sex: 04/12/1931 (84 y.o. Female) Treating RN: Carolyne Fiscal, Debi Primary Care Welden Hausmann: Lovie Macadamia, DAVID Other Clinician: Referring Aydien Majette: Lovie Macadamia, DAVID Treating Joeseph Verville/Extender: Melburn Hake, HOYT Weeks in Treatment: 52 Active Inactive ` Abuse / Safety / Falls / Self Care Management Nursing Diagnoses: Impaired physical mobility Potential for falls Goals: Patient will remain injury free Date Initiated: 09/06/2016 Target Resolution Date: 12/02/2016 Goal Status: Active Interventions: Assess fall  risk on admission and as needed Assess self care needs on admission and as needed Notes: ` Necrotic Tissue Nursing Diagnoses: Impaired tissue integrity related to necrotic/devitalized tissue Goals: Necrotic/devitalized tissue will be minimized in the wound bed Date Initiated: 09/06/2016 Target Resolution Date: 12/02/2016 Goal Status: Active Interventions: Assess patient pain level pre-, during and post procedure and prior to discharge Notes: ` Orientation to the Wound Care Program Nursing Diagnoses: Knowledge deficit related to the wound healing center program Goals: Patient/caregiver will verbalize understanding of the [facility] Date Initiated: 09/06/2016 Target Resolution Date: 12/02/2016 Goal Status: Active [name], [name] ([id number]) Interventions: Provide education on orientation to the wound center Notes: ` Pressure Nursing Diagnoses: Knowledge deficit related to management of pressures ulcers Potential for impaired tissue integrity related to pressure, friction, moisture, and shear Goals: Patient will remain free of pressure ulcers Date Initiated: 09/06/2016 Target Resolution Date: 12/02/2016 Goal Status: Active Interventions: Assess: immobility, friction, shearing, incontinence upon admission and as needed Notes: ` Soft Tissue Infection Nursing Diagnoses: Impaired tissue integrity Potential for infection: soft tissue Goals: Patient will remain free of wound infection Date Initiated: 09/06/2016 Target Resolution Date: 12/02/2016 Goal Status: Active Interventions: Assess signs and symptoms of infection every visit Notes: ` Wound/Skin Impairment Nursing Diagnoses: Impaired tissue integrity Goals: Ulcer/skin breakdown will heal within 14 weeks Date Initiated: 09/06/2016 Target Resolution Date: 12/16/2016 Goal Status: Active Interventions: Assess ulceration(s) every visit Notes:  [name], [name] ([id number]) Electronic  Signature(s) Signed: 01/11/2018 4:37:47 PM By: Alric Quan Entered By: Alric Quan on 01/10/2018 15:22:26 [name] ([id number]) -------------------------------------------------------------------------------- Pain Assessment Details Patient Name: [name] Date of Service: 01/10/2018 2:30 PM Medical Record Number: [id number] Patient Account Number: [id number] Date of Birth/Sex: 1931/06/07 (83 y.o. Female) Treating RN: Montey Hora Primary Care Jaeshaun Riva: Juluis Pitch Other Clinician: Referring Kiwanna Spraker: Lovie Macadamia, DAVID Treating Navy Belay/Extender: Melburn Hake, HOYT Weeks in Treatment: 70 Active Problems Location of Pain Severity and Description of Pain Patient Has Paino No Site Locations Pain Management and Medication Current Pain Management: Electronic Signature(s) Signed: 01/10/2018 4:44:49 PM By: Montey Hora Entered By: Montey Hora on 01/10/2018 15:01:54 [name] ([id number]) -------------------------------------------------------------------------------- Patient/Caregiver Education Details Patient Name: [name]. Date of Service: 01/10/2018 2:30 PM Medical Record Number: [id number] Patient Account Number: [id number] Date of Birth/Gender: 16-Feb-1931 (84 y.o. Female) Treating RN: Roger Shelter Primary Care Physician: Lovie Macadamia, DAVID Other Clinician: Referring Physician: Lovie Macadamia DAVID Treating Physician/Extender: Sharalyn Ink in Treatment: 75 Education Assessment Education Provided To: Patient Education Topics Provided Wound Debridement: Methods: Explain/Verbal Responses: State content correctly Wound/Skin Impairment: Handouts: Caring for Your Ulcer Methods: Explain/Verbal Responses: State content correctly Electronic Signature(s) Signed: 01/11/2018 7:46:43 AM By: Roger Shelter Entered By: Roger Shelter on 01/10/2018 15:55:53 [name], [name]  ([id number]) -------------------------------------------------------------------------------- Wound Assessment Details Patient Name: [name]. Date of Service: 01/10/2018 2:30 PM Medical Record Number: [id number] Patient Account Number: [id number] Date of Birth/Sex: 1931/10/08 (84 y.o. Female) Treating RN: Montey Hora Primary Care Terika Pillard: Juluis Pitch Other Clinician: Referring Varick Keys: Lovie Macadamia, DAVID Treating Berkley Wrightsman/Extender: Melburn Hake, HOYT Weeks in Treatment: 70 Wound Status Wound Number: 10 Primary Pressure Ulcer Etiology: Wound Location: Left Calcaneus Wound Status: Open Wounding Event: Pressure Injury Comorbid Cataracts, Hypertension, Peripheral Venous Date Acquired: 12/26/2017 History: Disease Weeks Of Treatment: 0 Clustered Wound: No Photos Photo Uploaded By: Montey Hora on 01/10/2018 15:33:58 Wound Measurements Length: (cm) 3.5 Width: (cm) 4.5 Depth: (cm) 0.2 Area: (cm) 12.37 Volume: (cm) 2.474 % Reduction in Area: % Reduction in Volume: Epithelialization: None Tunneling: No Undermining: No Wound Description Classification: Category/Stage III Wound Margin: Flat and Intact Exudate Amount: Large Exudate Type: Serosanguineous Exudate Color: red, brown Foul Odor After Cleansing: No Slough/Fibrino Yes Wound Bed Granulation Amount: Small (1-33%) Exposed Structure Granulation Quality: Pink Fascia Exposed: No Necrotic Amount: Large (67-100%) Fat Layer (Subcutaneous Tissue) Exposed: Yes Necrotic Quality: Eschar, Adherent Slough Tendon Exposed: No Muscle Exposed: No Joint Exposed: No Bone Exposed: No Periwound Skin Texture [place], Danielle S. ([id number]) Texture Color No Abnormalities Noted: No No Abnormalities Noted: No Callus: No Atrophie Blanche: No Crepitus: No Cyanosis: No Excoriation: No Ecchymosis: No Induration: No Erythema: No Rash: No Hemosiderin Staining: No Scarring: No Mottled: No Pallor: No Moisture Rubor: No No  Abnormalities Noted: No Dry / Scaly: No Temperature / Pain Maceration: No Temperature: No Abnormality Wound Preparation Ulcer Cleansing: Rinsed/Irrigated with Saline Topical Anesthetic Applied: Other: lidocaine 4%, Treatment Notes Wound #10 (Left Calcaneus) 1. Cleansed with: Clean wound with Normal Saline 2. Anesthetic Topical Lidocaine 4% cream to wound bed prior to debridement 4. Dressing Applied: Prisma Ag 5. Secondary Dressing Applied Dry Gauze Notes heel cup and kerlix wrap with tape. apply cushion boot for offloading Electronic Signature(s) Signed: 01/10/2018 4:44:49 PM By: Montey Hora Entered By: Montey Hora on 01/10/2018 15:13:36 [name] ([id number]) -------------------------------------------------------------------------------- Wound Assessment Details Patient Name: [name]. Date of Service: 01/10/2018 2:30 PM Medical Record Number: [id number] Patient Account Number: [id number] Date of Birth/Sex:  19-Jul-1931 (84 y.o. Female) Treating RN: Montey Hora Primary Care Arena Lindahl: Lovie Macadamia, DAVID Other Clinician: Referring Susy Placzek: Lovie Macadamia, DAVID Treating Lonette Stevison/Extender: Melburn Hake, HOYT Weeks in Treatment: 70 Wound Status Wound Number: 8 Primary Pressure Ulcer Etiology: Wound Location: Right Calcaneus Wound Status: Open Wounding Event: Pressure Injury Comorbid Cataracts, Hypertension, Peripheral Venous Date Acquired: 09/15/2017 History: Disease Weeks Of Treatment: 14 Clustered Wound: No Photos Photo Uploaded By: Montey Hora on 01/10/2018 15:33:59 Wound Measurements Length: (cm) 3.8 Width: (cm) 7 Depth: (cm) 0.1 Area: (cm) 20.892 Volume: (cm) 2.089 % Reduction in Area: -720.9% % Reduction in Volume: -310.4% Epithelialization: None Tunneling: No Undermining: No Wound Description Classification: Category/Stage III Wound Margin: Flat and Intact Exudate Amount: None Present Foul Odor After Cleansing: No Slough/Fibrino  Yes Wound Bed Granulation Amount: Small (1-33%) Exposed Structure Granulation Quality: Pink Fascia Exposed: No Necrotic Amount: Large (67-100%) Fat Layer (Subcutaneous Tissue) Exposed: Yes Necrotic Quality: Eschar, Adherent Slough Tendon Exposed: No Muscle Exposed: No Joint Exposed: No Bone Exposed: No Periwound Skin Texture Texture Color No Abnormalities Noted: No No Abnormalities Noted: No [name], [name]. ([id number]) Callus: No Atrophie Blanche: No Crepitus: No Cyanosis: No Excoriation: No Ecchymosis: No Induration: No Erythema: No Rash: No Hemosiderin Staining: No Scarring: No Mottled: No Pallor: No Moisture Rubor: No No Abnormalities Noted: No Dry / Scaly: No Temperature / Pain Maceration: No Temperature: No Abnormality Wound Preparation Ulcer Cleansing: Rinsed/Irrigated with Saline Topical Anesthetic Applied: Other: lidocaine 4%, Treatment Notes Wound #8 (Right Calcaneus) 1. Cleansed with: Clean wound with Normal Saline 2. Anesthetic Topical Lidocaine 4% cream to wound bed prior to debridement 4. Dressing Applied: Santyl Ointment 5. Secondary Dressing Applied Bordered Foam Dressing Dry Gauze Saline moistened guaze Notes heel cup and kerlix wrap with tape apply booties for off loading Electronic Signature(s) Signed: 01/10/2018 4:44:49 PM By: Montey Hora Entered By: Montey Hora on 01/10/2018 15:14:25 [name] ([id number]) -------------------------------------------------------------------------------- [place] Details Patient Name: [name]. Date of Service: 01/10/2018 2:30 PM Medical Record Number: [id number] Patient Account Number: [id number] Date of Birth/Sex: 1930-11-25 (84 y.o. Female) Treating RN: Montey Hora Primary Care Suzan Manon: Lovie Macadamia, DAVID Other Clinician: Referring Teira Arcilla: Lovie Macadamia, DAVID Treating Solei Wubben/Extender: Melburn Hake, HOYT Weeks in Treatment: 70 Vital Signs Time Taken: 15:01 Temperature (F):  98.3 Height (in): 63 Pulse (bpm): 75 Weight (lbs): 160 Respiratory Rate (breaths/min): 16 Body Mass Index (BMI): 28.3 Blood Pressure (mmHg): 140/69 Reference Range: 80 - 120 mg / dl Electronic Signature(s) Signed: 01/10/2018 4:44:49 PM By: Montey Hora Entered By: Montey Hora on 01/10/2018 15:02:43
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
## 2018-01-17 ENCOUNTER — Ambulatory Visit: Payer: Medicare HMO | Admitting: Physician Assistant

## 2018-03-02 ENCOUNTER — Emergency Department: Payer: Medicare HMO

## 2018-03-02 ENCOUNTER — Other Ambulatory Visit: Payer: Self-pay

## 2018-03-02 ENCOUNTER — Emergency Department
Admission: EM | Admit: 2018-03-02 | Discharge: 2018-03-02 | Disposition: A | Discharge status: Home / Self Care | Payer: Medicare HMO | Attending: Emergency Medicine | Admitting: Emergency Medicine

## 2018-03-02 DIAGNOSIS — N390 Urinary tract infection, site not specified: Secondary | ICD-10-CM | POA: Diagnosis not present

## 2018-03-02 DIAGNOSIS — N183 Chronic kidney disease, stage 3 (moderate): Secondary | ICD-10-CM | POA: Diagnosis not present

## 2018-03-02 DIAGNOSIS — Z96653 Presence of artificial knee joint, bilateral: Secondary | ICD-10-CM | POA: Diagnosis not present

## 2018-03-02 DIAGNOSIS — R4182 Altered mental status, unspecified: Secondary | ICD-10-CM | POA: Diagnosis present

## 2018-03-02 DIAGNOSIS — Z79899 Other long term (current) drug therapy: Secondary | ICD-10-CM | POA: Diagnosis not present

## 2018-03-02 DIAGNOSIS — I5030 Unspecified diastolic (congestive) heart failure: Secondary | ICD-10-CM | POA: Insufficient documentation

## 2018-03-02 DIAGNOSIS — I130 Hypertensive heart and chronic kidney disease with heart failure and stage 1 through stage 4 chronic kidney disease, or unspecified chronic kidney disease: Secondary | ICD-10-CM | POA: Diagnosis not present

## 2018-03-02 DIAGNOSIS — Z87891 Personal history of nicotine dependence: Secondary | ICD-10-CM | POA: Diagnosis not present

## 2018-03-02 LAB — LACTIC ACID, PLASMA
Lactic Acid, Venous: 1.6 mmol/L (ref 0.5–1.9)
Lactic Acid, Venous: 1.5 mmol/L (ref 0.5–1.9)

## 2018-03-02 LAB — CBC WITH DIFFERENTIAL/PLATELET
Basophils Absolute: 0 10*3/uL (ref 0–0.1)
Basophils Relative: 0 %
EOS PCT: 0 %
Eosinophils Absolute: 0 10*3/uL (ref 0–0.7)
HCT: 31.6 % — ABNORMAL LOW (ref 35.0–47.0)
Hemoglobin: 10.8 g/dL — ABNORMAL LOW (ref 12.0–16.0)
LYMPHS ABS: 1 10*3/uL (ref 1.0–3.6)
LYMPHS PCT: 10 %
MCH: 30.7 pg (ref 26.0–34.0)
MCHC: 34.1 g/dL (ref 32.0–36.0)
MCV: 90.1 fL (ref 80.0–100.0)
MONO ABS: 0.8 10*3/uL (ref 0.2–0.9)
Monocytes Relative: 9 %
Neutro Abs: 7.6 10*3/uL — ABNORMAL HIGH (ref 1.4–6.5)
Neutrophils Relative %: 81 %
PLATELETS: 250 10*3/uL (ref 150–440)
RBC: 3.51 MIL/uL — AB (ref 3.80–5.20)
RDW: 16.2 % — AB (ref 11.5–14.5)
WBC: 9.5 10*3/uL (ref 3.6–11.0)

## 2018-03-02 LAB — COMPREHENSIVE METABOLIC PANEL
ALBUMIN: 3.7 g/dL (ref 3.5–5.0)
ALK PHOS: 125 U/L (ref 38–126)
ALT: 16 U/L (ref 14–54)
AST: 28 U/L (ref 15–41)
Anion gap: 10 (ref 5–15)
BUN: 49 mg/dL — ABNORMAL HIGH (ref 6–20)
CALCIUM: 10.1 mg/dL (ref 8.9–10.3)
CO2: 28 mmol/L (ref 22–32)
CREATININE: 1.33 mg/dL — AB (ref 0.44–1.00)
Chloride: 97 mmol/L — ABNORMAL LOW (ref 101–111)
GFR calc Af Amer: 41 mL/min — ABNORMAL LOW (ref >60)
GFR calc non Af Amer: 36 mL/min — ABNORMAL LOW (ref >60)
GLUCOSE: 100 mg/dL — AB (ref 65–99)
Potassium: 4.8 mmol/L (ref 3.5–5.1)
SODIUM: 135 mmol/L (ref 135–145)
Total Bilirubin: 0.7 mg/dL (ref 0.3–1.2)
Total Protein: 8.8 g/dL — ABNORMAL HIGH (ref 6.5–8.1)

## 2018-03-02 LAB — URINALYSIS, COMPLETE (UACMP) WITH MICROSCOPIC
Bilirubin Urine: NEGATIVE
GLUCOSE, UA: NEGATIVE mg/dL
KETONES UR: NEGATIVE mg/dL
NITRITE: NEGATIVE
PROTEIN: 100 mg/dL — AB
RBC / HPF: >50 RBC/hpf — ABNORMAL HIGH (ref 0–5)
Specific Gravity, Urine: 1.012 (ref 1.005–1.030)
pH: 8 (ref 5.0–8.0)

## 2018-03-02 LAB — PROTIME-INR
INR: 1.08
Prothrombin Time: 13.9 seconds (ref 11.4–15.2)

## 2018-03-02 MED ORDER — NITROFURANTOIN MONOHYD MACRO 100 MG PO CAPS: 100.0000 mg | ORAL_CAPSULE | Freq: Two times a day (BID) | ORAL | 0 refills | Status: AC

## 2018-03-02 NOTE — ED Notes (Signed)
Pt had 1 BM, pt cleaned and given peri care. Pt resting at this time.

## 2018-03-02 NOTE — ED Notes (Signed)
Pt signed ED discharge hardcopy and placed on chart

## 2018-03-02 NOTE — ED Notes (Signed)
Pt waiting for EMS to arrive

## 2018-03-02 NOTE — ED Provider Notes (Signed)
[facility] Emergency Department Provider Note   ____________________________________________    I have reviewed the triage vital signs and the nursing notes.   HISTORY  Chief Complaint Altered Mental Status     HPI [name] is a 84 y.o. female who presents after being found unresponsive this morning at nursing facility.  EMS reports patient was responsive upon their arrival.  There is some concern about a urinary tract infection.  Patient tells me that she feels quite well and has no complaints.  Review of records demonstrates a history of encephalopathy in the past, usually attributed to urinary tract infections.  No neuro deficits.  No nausea or vomiting.   Past Medical History:  Diagnosis Date  . Anemia   . Chronic kidney disease, stage 3 ([place])   . Degenerative joint disease (DJD) of lumbar spine   . Diastolic dysfunction   . Dizziness   . Edema   . ETD (eustachian tube dysfunction)   . Heartburn   . History of rectal bleeding   . HTN (hypertension)   . Hypercalcemia   . Intracranial aneurysm   . Low vision, one eye   . Nephritis and nephropathy   . Osteoporosis   . Pyuria   . Urge incontinence   . Urinary frequency   . Urinary retention     Patient Active Problem List   Diagnosis Date Noted  . Gastric foreign body   . Stricture and stenosis of esophagus   . Acute encephalopathy 12/03/2017  . Dysphagia 12/03/2017  . Pressure injury of skin 03/30/2017  . Sepsis ([place]) 03/28/2017  . Pressure ulcer 06/09/2016  . Syncope 06/08/2016  . Urinary tract infection 06/08/2016  . Periprosthetic fracture around internal prosthetic left knee joint 05/25/2016  . Fracture of femur, distal, left, closed ([place]) 05/25/2016  . Closed displaced fracture of base of fourth metacarpal bone of right hand 05/25/2016  . Closed displaced fracture of base of fifth metacarpal bone of right hand 05/25/2016  . Traumatic perinephric hematoma 05/09/2016    . SAH (subarachnoid hemorrhage) ([place]) 05/08/2016  . Trauma 05/08/2016  . SDH (subdural hematoma) ([place]) 05/08/2016  . Perinephric hematoma 05/08/2016  . Hypertension 05/08/2016  . Fractures involving multiple body regions 05/08/2016  . CKD (chronic kidney disease) 05/08/2016  . Brain edema ([place]) 05/08/2016  . Acute blood loss anemia 05/08/2016    Past Surgical History:  Procedure Laterality Date  . dilation of esophageal web    . ESOPHAGOGASTRODUODENOSCOPY (EGD) WITH PROPOFOL N/A 12/06/2017   Procedure: ESOPHAGOGASTRODUODENOSCOPY (EGD) WITH PROPOFOL;  Surgeon: Lucilla Lame, MD;  Location: [facility] ENDOSCOPY;  Service: Endoscopy;  Laterality: N/A;  . FRACTURE SURGERY Left    femur  . IR GENERIC HISTORICAL  08/05/2016   IR CATHETER TUBE CHANGE 08/05/2016 [name], MD [facility]  . IR GENERIC HISTORICAL  09/17/2016   IR CATHETER TUBE CHANGE 09/17/2016 [facility]  . IR GENERIC HISTORICAL  11/19/2016   IR CATHETER TUBE CHANGE 11/19/2016 [name], MD [facility]  . IR GENERIC HISTORICAL  11/29/2016   IR CATHETER TUBE CHANGE 11/29/2016 [name], DO [facility]  . JOINT REPLACEMENT Bilateral   . TOTAL KNEE ARTHROPLASTY Bilateral   . VAGINAL HYSTERECTOMY  1967   ovaries also removed because of fibroida    Prior to Admission medications   Medication Sig Start Date End Date Taking? Authorizing Provider  amLODipine (NORVASC) 10 MG tablet TAKE ONE TABLET BY MOUTH EVERY DAY HIGH BLOOD PRESSURE 12/13/16  Yes [provider]  atenolol (TENORMIN) 25 MG tablet Take 1 tablet (25 mg total) by mouth daily. 06/10/16  Yes Mody, Sital, MD  CALCIUM CITRATE+D3 PETITES 200-250 MG-UNIT TABS Take 1 tablet by mouth daily. 11/08/17  Yes [provider]  cholecalciferol (VITAMIN D) 1000 units tablet Take 1,000 Units by mouth daily. 11/08/17  Yes [provider]  feeding supplement, ENSURE ENLIVE, (ENSURE ENLIVE) LIQD Take 237 mLs by mouth 3 (three) times daily between  meals. 03/30/17  Yes Mody, Sital, MD  FEROSUL 325 (65 Fe) MG tablet Take 325 mg by mouth 2 (two) times daily. 11/08/17  Yes [provider]  furosemide (LASIX) 20 MG tablet Take 20 mg by mouth daily. 11/08/17  Yes [provider]  gabapentin (NEURONTIN) 300 MG capsule Take 300 mg by mouth 3 (three) times daily. 11/08/17  Yes [provider]  losartan (COZAAR) 100 MG tablet Take 1 tablet (100 mg total) by mouth daily. 06/10/16  Yes Bettey Costa, MD  Multiple Vitamin (MULTIVITAMIN) tablet Take 1 tablet by mouth daily. 06/10/16  Yes Mody, Ulice Bold, MD  oxybutynin (DITROPAN) 5 MG tablet Take 5 mg by mouth 2 (two) times daily. 12/04/16  Yes [provider]  acetaminophen (TYLENOL) 325 MG tablet Take 650 mg by mouth every 6 (six) hours as needed for mild pain.     [provider]  hydrOXYzine (ATARAX/VISTARIL) 10 MG tablet Take 10 mg by mouth 3 (three) times daily as needed. 11/08/17   [provider]  nitrofurantoin, macrocrystal-monohydrate, (MACROBID) 100 MG capsule Take 1 capsule (100 mg total) by mouth 2 (two) times daily for 10 days. 03/02/18 03/12/18  Lavonia Drafts, MD  traMADol (ULTRAM) 50 MG tablet Take 1 tablet by mouth every 8 (eight) hours as needed. 02/23/18   [provider]     Allergies Patient has no known allergies.  Family History  Problem Relation Age of Onset  . Kidney disease Neg Hx   . Bladder Cancer Neg Hx   . Kidney cancer Neg Hx     Social History Social History   Tobacco Use  . Smoking status: Former Smoker    Last attempt to quit: 07/23/1964    Years since quitting: 53.6  . Smokeless tobacco: Never Used  . Tobacco comment: quit 1965  Substance Use Topics  . Alcohol use: No    Alcohol/week: 0.0 oz  . Drug use: No    Review of Systems  Constitutional: No fever/chills Eyes: No visual changes.  ENT: No sore throat. Cardiovascular: Denies chest pain. Respiratory: Denies shortness of breath. Gastrointestinal:  No abdominal pain Genitourinary: Foul-smelling urine reported Musculoskeletal: Negative for back pain. Skin: Negative for rash. Neurological: Negative for headaches    ____________________________________________   PHYSICAL EXAM:  VITAL SIGNS: ED Triage Vitals  Enc Vitals Group     BP 03/02/18 0854 (!) 121/57     Pulse Rate 03/02/18 0854 79     Resp 03/02/18 0854 20     Temp 03/02/18 0854 99.3 F (37.4 C)     Temp Source 03/02/18 0854 Oral     SpO2 03/02/18 0847 98 %     Weight 03/02/18 0855 56.7 kg (125 lb)     Height 03/02/18 0855 1.575 m (5\' 2" )     Head Circumference --      Peak Flow --      Pain Score 03/02/18 0855 0     Pain Loc --      Pain Edu? --  Excl. in [place]? --     Constitutional: Alert and oriented. No acute distress. Eyes: Conjunctivae are normal.   Nose: No congestion/rhinnorhea. Mouth/Throat: Mucous membranes are moist.    Cardiovascular: Normal rate, regular rhythm. Grossly normal heart sounds.  Good peripheral circulation. Respiratory: Normal respiratory effort.  No retractions. Lungs CTAB. Gastrointestinal: Soft and nontender. No distention.  No CVA tenderness. Genitourinary: Suprapubic catheter although the appears to urinate normally as well Musculoskeletal: No lower extremity tenderness nor edema.  Warm and well perfused Neurologic:  Normal speech and language. No gross focal neurologic deficits are appreciated.  Skin:  Skin is warm, dry and intact. No rash noted. Psychiatric: Mood and affect are normal. Speech and behavior are normal.  ____________________________________________   LABS (all labs ordered are listed, but only abnormal results are displayed)  Labs Reviewed  COMPREHENSIVE METABOLIC PANEL - Abnormal; Notable for the following components:      Result Value   Chloride 97 (*)    Glucose, Bld 100 (*)    BUN 49 (*)    Creatinine, Ser 1.33 (*)    Total Protein 8.8 (*)    GFR calc non Af Amer 36 (*)    GFR calc Af Amer 41  (*)    All other components within normal limits  CBC WITH DIFFERENTIAL/PLATELET - Abnormal; Notable for the following components:   RBC 3.51 (*)    Hemoglobin 10.8 (*)    HCT 31.6 (*)    RDW 16.2 (*)    Neutro Abs 7.6 (*)    All other components within normal limits  URINALYSIS, COMPLETE (UACMP) WITH MICROSCOPIC - Abnormal; Notable for the following components:   Color, Urine AMBER (*)    APPearance TURBID (*)    Hgb urine dipstick MODERATE (*)    Protein, ur 100 (*)    Leukocytes, UA LARGE (*)    RBC / HPF >50 (*)    WBC, UA >50 (*)    Bacteria, UA MANY (*)    All other components within normal limits  CULTURE, BLOOD (ROUTINE X 2)  CULTURE, BLOOD (ROUTINE X 2)  URINE CULTURE  LACTIC ACID, PLASMA  LACTIC ACID, PLASMA  PROTIME-INR  URINALYSIS, COMPLETE (UACMP) WITH MICROSCOPIC   ____________________________________________  EKG  ED ECG REPORT I, Lavonia Drafts, the attending physician, personally viewed and interpreted this ECG.  Date: 03/02/2018  Rhythm: normal sinus rhythm QRS Axis: normal Intervals: Right bundle branch block ST/T Wave abnormalities: normal   ____________________________________________  RADIOLOGY  Chest x-ray unremarkable  ____________________________________________   PROCEDURES  Procedure(s) performed: No  Procedures   Critical Care performed: No ____________________________________________   INITIAL IMPRESSION / ASSESSMENT AND PLAN / ED COURSE  Pertinent labs & imaging results that were available during my care of the patient were reviewed by me and considered in my medical decision making (see chart for details).  Patient presents for reported altered mental status.  She appears to be at her baseline now.  Minimal elevation in temperature, otherwise vitals unremarkable.  Will check labs, urinalysis, chest x-ray and reevaluate.  Lab work is overall quite reassuring, lactic normal.  Chest x-ray normal.  Difficulty collecting  urine however urinalysis is not resulted consistent with UTI, this may have had some impact on her altered mental status although in the emergency department she seems quite at her baseline and her sister agrees she is at her baseline.  She is afebrile normal heart rate normal blood pressure.  Will treat with p.o. antibiotics, send urine culture have  the patient follow-up closely with PCP.    ____________________________________________   FINAL CLINICAL IMPRESSION(S) / ED DIAGNOSES  Final diagnoses:  Lower urinary tract infectious disease  Altered mental status, unspecified altered mental status type        Note:  This document was prepared using Dragon voice recognition software and may include unintentional dictation errors.    Lavonia Drafts, MD 03/02/18 [phone]

## 2018-03-02 NOTE — ED Triage Notes (Signed)
Pt arrived via [facility] from [place]. EMS reports that the facility states that they found the pt unresponsive with last known normal time at 0030 this morning. Pt's roommate states that the pt has been c/o difficulty urinating for the past 3 days. Upon EMS arrival pt become responsive, AOx2 which is her baseline. Pt has a suprapubic catheter in place with a strong smell of malodorous urine,

## 2018-03-02 NOTE — ED Notes (Signed)
EMS here to pick up pt to transport back home

## 2018-03-03 LAB — URINE CULTURE

## 2018-03-07 LAB — CULTURE, BLOOD (ROUTINE X 2)
Culture: NO GROWTH
Culture: NO GROWTH
SPECIAL REQUESTS: ADEQUATE

## 2018-04-02 ENCOUNTER — Emergency Department
Admission: EM | Admit: 2018-04-02 | Discharge: 2018-04-03 | Disposition: A | Discharge status: Home / Self Care | Payer: Medicare HMO | Attending: Emergency Medicine | Admitting: Emergency Medicine

## 2018-04-02 ENCOUNTER — Other Ambulatory Visit: Payer: Self-pay

## 2018-04-02 ENCOUNTER — Encounter: Payer: Self-pay | Admitting: *Deleted

## 2018-04-02 DIAGNOSIS — Z87891 Personal history of nicotine dependence: Secondary | ICD-10-CM | POA: Diagnosis not present

## 2018-04-02 DIAGNOSIS — T83028A Displacement of other indwelling urethral catheter, initial encounter: Secondary | ICD-10-CM | POA: Insufficient documentation

## 2018-04-02 DIAGNOSIS — I5030 Unspecified diastolic (congestive) heart failure: Secondary | ICD-10-CM | POA: Insufficient documentation

## 2018-04-02 DIAGNOSIS — Z466 Encounter for fitting and adjustment of urinary device: Secondary | ICD-10-CM

## 2018-04-02 DIAGNOSIS — N183 Chronic kidney disease, stage 3 (moderate): Secondary | ICD-10-CM | POA: Diagnosis not present

## 2018-04-02 DIAGNOSIS — Z79899 Other long term (current) drug therapy: Secondary | ICD-10-CM | POA: Insufficient documentation

## 2018-04-02 DIAGNOSIS — I130 Hypertensive heart and chronic kidney disease with heart failure and stage 1 through stage 4 chronic kidney disease, or unspecified chronic kidney disease: Secondary | ICD-10-CM | POA: Diagnosis not present

## 2018-04-02 DIAGNOSIS — Y733 Surgical instruments, materials and gastroenterology and urology devices (including sutures) associated with adverse incidents: Secondary | ICD-10-CM | POA: Insufficient documentation

## 2018-04-02 NOTE — ED Triage Notes (Signed)
Per EMS pt had her suprapubic catheter replaced yesterday per routine monthly change and she said that it never felt right and this am it fell out. Pt states she has had the catheter for several months. States she has worn a diaper all day

## 2018-04-03 NOTE — ED Notes (Signed)
Called to [facility] to get information on catheter. Was originally placed 10/207 and is changed monthly. 16FR / 10cc balloon used

## 2018-04-03 NOTE — Discharge Instructions (Addendum)
Please follow up with your primary care physician.

## 2018-04-03 NOTE — ED Provider Notes (Signed)
[facility] Emergency Department Provider Note   ____________________________________________   First MD Initiated Contact with Patient 04/02/18 2345     (approximate)  I have reviewed the triage vital signs and the nursing notes.   HISTORY  Chief Complaint Dysuria    HPI [name] is a 84 y.o. female who comes into the hospital today from her nursing home.  The patient states that she had her suprapubic catheter changed yesterday morning but it did not feel right.  She reports that today when they were changing her diaper the catheter fell out.  She was sent in to have it replaced.  The patient usually has her catheter changed by home health aide each month.  She denies any other complaints and is here just to have it replaced.  She states that she has been urinating into her diaper.  Past Medical History:  Diagnosis Date  . Anemia   . Chronic kidney disease, stage 3 ([place])   . Degenerative joint disease (DJD) of lumbar spine   . Diastolic dysfunction   . Dizziness   . Edema   . ETD (eustachian tube dysfunction)   . Heartburn   . History of rectal bleeding   . HTN (hypertension)   . Hypercalcemia   . Intracranial aneurysm   . Low vision, one eye   . Nephritis and nephropathy   . Osteoporosis   . Pyuria   . Urge incontinence   . Urinary frequency   . Urinary retention     Patient Active Problem List   Diagnosis Date Noted  . Gastric foreign body   . Stricture and stenosis of esophagus   . Acute encephalopathy 12/03/2017  . Dysphagia 12/03/2017  . Pressure injury of skin 03/30/2017  . Sepsis ([place]) 03/28/2017  . Pressure ulcer 06/09/2016  . Syncope 06/08/2016  . Urinary tract infection 06/08/2016  . Periprosthetic fracture around internal prosthetic left knee joint 05/25/2016  . Fracture of femur, distal, left, closed ([place]) 05/25/2016  . Closed displaced fracture of base of fourth metacarpal bone of right hand 05/25/2016  . Closed  displaced fracture of base of fifth metacarpal bone of right hand 05/25/2016  . Traumatic perinephric hematoma 05/09/2016  . SAH (subarachnoid hemorrhage) ([place]) 05/08/2016  . Trauma 05/08/2016  . SDH (subdural hematoma) ([place]) 05/08/2016  . Perinephric hematoma 05/08/2016  . Hypertension 05/08/2016  . Fractures involving multiple body regions 05/08/2016  . CKD (chronic kidney disease) 05/08/2016  . Brain edema ([place]) 05/08/2016  . Acute blood loss anemia 05/08/2016    Past Surgical History:  Procedure Laterality Date  . dilation of esophageal web    . ESOPHAGOGASTRODUODENOSCOPY (EGD) WITH PROPOFOL N/A 12/06/2017   Procedure: ESOPHAGOGASTRODUODENOSCOPY (EGD) WITH PROPOFOL;  Surgeon: Lucilla Lame, MD;  Location: [facility] ENDOSCOPY;  Service: Endoscopy;  Laterality: N/A;  . FRACTURE SURGERY Left    femur  . IR GENERIC HISTORICAL  08/05/2016   IR CATHETER TUBE CHANGE 08/05/2016 [name], MD [facility]  . IR GENERIC HISTORICAL  09/17/2016   IR CATHETER TUBE CHANGE 09/17/2016 [facility]  . IR GENERIC HISTORICAL  11/19/2016   IR CATHETER TUBE CHANGE 11/19/2016 [name], MD [facility]  . IR GENERIC HISTORICAL  11/29/2016   IR CATHETER TUBE CHANGE 11/29/2016 [name], DO [facility]  . JOINT REPLACEMENT Bilateral   . TOTAL KNEE ARTHROPLASTY Bilateral   . VAGINAL HYSTERECTOMY  1967   ovaries also removed because of fibroida    Prior to Admission medications  Medication Sig Start Date End Date Taking? Authorizing Provider  acetaminophen (TYLENOL) 325 MG tablet Take 650 mg by mouth every 6 (six) hours as needed for mild pain.     [provider]  amLODipine (NORVASC) 10 MG tablet TAKE ONE TABLET BY MOUTH EVERY DAY HIGH BLOOD PRESSURE 12/13/16   [provider]  atenolol (TENORMIN) 25 MG tablet Take 1 tablet (25 mg total) by mouth daily. 06/10/16   Bettey Costa, MD  CALCIUM CITRATE+D3 PETITES 200-250 MG-UNIT TABS Take 1 tablet by mouth daily. 11/08/17    [provider]  cholecalciferol (VITAMIN D) 1000 units tablet Take 1,000 Units by mouth daily. 11/08/17   [provider]  feeding supplement, ENSURE ENLIVE, (ENSURE ENLIVE) LIQD Take 237 mLs by mouth 3 (three) times daily between meals. 03/30/17   Bettey Costa, MD  FEROSUL 325 (65 Fe) MG tablet Take 325 mg by mouth 2 (two) times daily. 11/08/17   [provider]  furosemide (LASIX) 20 MG tablet Take 20 mg by mouth daily. 11/08/17   [provider]  gabapentin (NEURONTIN) 300 MG capsule Take 300 mg by mouth 3 (three) times daily. 11/08/17   [provider]  hydrOXYzine (ATARAX/VISTARIL) 10 MG tablet Take 10 mg by mouth 3 (three) times daily as needed. 11/08/17   [provider]  losartan (COZAAR) 100 MG tablet Take 1 tablet (100 mg total) by mouth daily. 06/10/16   Bettey Costa, MD  Multiple Vitamin (MULTIVITAMIN) tablet Take 1 tablet by mouth daily. 06/10/16   Bettey Costa, MD  oxybutynin (DITROPAN) 5 MG tablet Take 5 mg by mouth 2 (two) times daily. 12/04/16   [provider]  traMADol (ULTRAM) 50 MG tablet Take 1 tablet by mouth every 8 (eight) hours as needed. 02/23/18   [provider]    Allergies Patient has no known allergies.  Family History  Problem Relation Age of Onset  . Kidney disease Neg Hx   . Bladder Cancer Neg Hx   . Kidney cancer Neg Hx     Social History Social History   Tobacco Use  . Smoking status: Former Smoker    Last attempt to quit: 07/23/1964    Years since quitting: 53.7  . Smokeless tobacco: Never Used  . Tobacco comment: quit 1965  Substance Use Topics  . Alcohol use: No    Alcohol/week: 0.0 oz  . Drug use: No    Review of Systems  Constitutional: No fever/chills Eyes: No visual changes. ENT: No sore throat. Cardiovascular: Denies chest pain. Respiratory: Denies shortness of breath. Gastrointestinal: No abdominal pain.   Genitourinary: Negative for dysuria. Musculoskeletal: Negative  for back pain. Skin: Negative for rash. Neurological: Negative for headaches   ____________________________________________   PHYSICAL EXAM:  VITAL SIGNS: ED Triage Vitals  Enc Vitals Group     BP 04/02/18 2320 138/74     Pulse Rate 04/02/18 2320 72     Resp 04/02/18 2320 15     Temp 04/02/18 2320 98.3 F (36.8 C)     Temp Source 04/02/18 2320 Oral     SpO2 04/02/18 2320 100 %     Weight 04/02/18 2321 125 lb (56.7 kg)     Height 04/02/18 2321 5\' 3"  (1.6 m)     Head Circumference --      Peak Flow --      Pain Score 04/02/18 2321 0     Pain Loc --      Pain Edu? --  Excl. in [place]? --     Constitutional: Alert and oriented. Well appearing and in mild distress. Eyes: Conjunctivae are normal. PERRL. EOMI. Head: Atraumatic. Nose: No congestion/rhinnorhea. Mouth/Throat: Mucous membranes are moist.  Oropharynx non-erythematous. Cardiovascular: Normal rate, regular rhythm. Grossly normal heart sounds.  Good peripheral circulation. Respiratory: Normal respiratory effort.  No retractions. Lungs CTAB. Gastrointestinal: Soft and nontender. No distention.  Positive bowel sounds, suprapubic catheter not in place Musculoskeletal: No lower extremity tenderness nor edema.   Neurologic:  Normal speech and language.  Skin:  Skin is warm, dry and intact.  Psychiatric: Mood and affect are normal.   ____________________________________________   LABS (all labs ordered are listed, but only abnormal results are displayed)  Labs Reviewed - No data to display ____________________________________________  EKG  none ____________________________________________  RADIOLOGY  ED MD interpretation:  none  Official radiology report(s): No results found.  ____________________________________________   PROCEDURES  Procedure(s) performed: please, see procedure note(s).  BLADDER CATHETERIZATION Date/Time: 04/03/2018 12:30 AM Performed by: Loney Hering, MD Authorized by:  Loney Hering, MD   Consent:    Consent obtained:  Verbal   Consent given by:  Patient   Risks discussed:  Infection and pain Pre-procedure details:    Preparation: Patient was prepped and draped in usual sterile fashion   Procedure details:    Provider performed due to:  Altered anatomy   Altered anatomy details: suprapubic catheter.   Catheter insertion:  Indwelling   Catheter type:  Foley   Catheter size:  16 Fr   Bladder irrigation: no     Number of attempts:  1   Urine characteristics:  Clear and bloody Post-procedure details:    Patient tolerance of procedure:  Tolerated well, no immediate complications    Critical Care performed: No  ____________________________________________   INITIAL IMPRESSION / ASSESSMENT AND PLAN / ED COURSE  As part of my medical decision making, I reviewed the following data within the electronic MEDICAL RECORD NUMBER Notes from prior ED visits and [place] Controlled Substance Database   This is an 84 year old female who comes into the hospital today to have her suprapubic catheter replaced.  I did contact Dr. Junious Silk and he states that the patient can just have a 79 French Foley placed in the catheter site as it has been there for 3 years.  We also confirmed this with the nursing home.  I was able to place the suprapubic catheter and there was some blood-tinged urine that came from the patient's bladder with some blood clots.  It was not cloudy and the patient had no other complaints.  The patient states that she feels comfortable with no pain.  She will be discharged back to her nursing home for further evaluation.      ____________________________________________   FINAL CLINICAL IMPRESSION(S) / ED DIAGNOSES  Final diagnoses:  Encounter for Foley catheter replacement     ED Discharge Orders    None       Note:  This document was prepared using Dragon voice recognition software and may include unintentional dictation errors.      Loney Hering, MD 04/03/18 [phone]

## 2018-04-14 ENCOUNTER — Ambulatory Visit: Payer: Medicare HMO

## 2018-04-14 VITALS — BP 134/78 | HR 68

## 2018-04-14 DIAGNOSIS — Z9359 Other cystostomy status: Secondary | ICD-10-CM

## 2018-04-14 NOTE — Progress Notes (Signed)
Pt presents in clinic for evaluation of "urine leakage" pt as well as caregiver present state that this pt has been leaking urine from the urethra x several days. This issue has sense improved and there has been no leakage of urine in several days. Pt denies any other symptoms. Pt with SPT tube changed on 04/03/2018 at [facility] ED. Advised pt that it would not be favorable at this time to change her tube as it looks great, and she was likely having bladder spasms. Pt gave verbal understanding. Advised pt to call back for questions or concerns.

## 2018-06-02 ENCOUNTER — Encounter: Payer: Self-pay | Admitting: Urology

## 2018-06-02 ENCOUNTER — Ambulatory Visit (INDEPENDENT_AMBULATORY_CARE_PROVIDER_SITE_OTHER): Payer: Medicare HMO | Admitting: Urology

## 2018-06-02 VITALS — BP 140/67 | HR 70

## 2018-06-02 DIAGNOSIS — Z9359 Other cystostomy status: Secondary | ICD-10-CM | POA: Diagnosis not present

## 2018-06-02 DIAGNOSIS — R339 Retention of urine, unspecified: Secondary | ICD-10-CM

## 2018-06-02 NOTE — Progress Notes (Signed)
   06/02/18  CC:  Chief Complaint  Patient presents with  . Cysto    HPI: 84 year old female with a history of chronic indwelling suprapubic tube for management of chronic retention who presents today for annual cystoscopy. Her suprapubic tube was originally placed in 07/2016.  Vitals:   06/02/18 1359  BP: 140/67  Pulse: 70   NED. A&Ox3.   No respiratory distress   Abd soft, NT, ND Normal external genitalia with patent urethral meatus  Cystoscopy Procedure Note  Patient identification was confirmed, informed consent was obtained, and patient was prepped using Betadine solution.  Lidocaine jelly was administered per SPT tract.    Preoperative abx where received prior to procedure.    Procedure: - Flexible cystoscope introduced via SPT tract without any difficulty.   - Thorough search of the bladder revealed:    normal urothelium with mild cystitis    no stones    no ulcers     no tumors    no trabeculation    Bladder neck in open confirmation  - Ureteral orifices were normal in position and appearance.  Post-Procedure: - Patient tolerated the procedure well  Following the procedure, her suprapubic tube site was reprepped. A 16 French Foley catheter was then inserted without difficulty with return of clear irrigant fluid. The balloon was filled with 10 cc of sterile water.  Assessment/ Plan:  1. Urinary retention S/p annual cysto, will be due again 05/2019 Continue monthly SPT exchanges - ciprofloxacin (CIPRO) tablet 500 mg; Take 1 tablet (500 mg total) by mouth once.   Hollice Espy, MD

## 2018-07-24 ENCOUNTER — Other Ambulatory Visit: Payer: Self-pay

## 2018-07-24 ENCOUNTER — Emergency Department: Payer: Medicare Other

## 2018-07-24 ENCOUNTER — Encounter
Admission: EM | Disposition: A | Discharge status: Skilled Nursing Facility | Payer: Self-pay | Source: Home / Self Care | Attending: Internal Medicine

## 2018-07-24 ENCOUNTER — Inpatient Hospital Stay: Payer: Medicare Other | Admitting: Anesthesiology

## 2018-07-24 ENCOUNTER — Encounter: Payer: Self-pay | Admitting: Medical Oncology

## 2018-07-24 ENCOUNTER — Inpatient Hospital Stay
Admission: EM | Admit: 2018-07-24 | Discharge: 2018-07-25 | DRG: 470 | Disposition: A | Discharge status: Skilled Nursing Facility | Payer: Medicare Other | Attending: Internal Medicine | Admitting: Internal Medicine

## 2018-07-24 ENCOUNTER — Inpatient Hospital Stay: Payer: Medicare Other

## 2018-07-24 DIAGNOSIS — T83518A Infection and inflammatory reaction due to other urinary catheter, initial encounter: Secondary | ICD-10-CM | POA: Diagnosis present

## 2018-07-24 DIAGNOSIS — N3000 Acute cystitis without hematuria: Secondary | ICD-10-CM | POA: Diagnosis present

## 2018-07-24 DIAGNOSIS — S72002A Fracture of unspecified part of neck of left femur, initial encounter for closed fracture: Secondary | ICD-10-CM | POA: Diagnosis present

## 2018-07-24 DIAGNOSIS — K219 Gastro-esophageal reflux disease without esophagitis: Secondary | ICD-10-CM | POA: Diagnosis present

## 2018-07-24 DIAGNOSIS — I739 Peripheral vascular disease, unspecified: Secondary | ICD-10-CM | POA: Diagnosis present

## 2018-07-24 DIAGNOSIS — Z79899 Other long term (current) drug therapy: Secondary | ICD-10-CM

## 2018-07-24 DIAGNOSIS — M80052A Age-related osteoporosis with current pathological fracture, left femur, initial encounter for fracture: Secondary | ICD-10-CM | POA: Diagnosis present

## 2018-07-24 DIAGNOSIS — Z96649 Presence of unspecified artificial hip joint: Secondary | ICD-10-CM

## 2018-07-24 DIAGNOSIS — Z87891 Personal history of nicotine dependence: Secondary | ICD-10-CM | POA: Diagnosis not present

## 2018-07-24 DIAGNOSIS — Z66 Do not resuscitate: Secondary | ICD-10-CM | POA: Diagnosis present

## 2018-07-24 DIAGNOSIS — Z993 Dependence on wheelchair: Secondary | ICD-10-CM | POA: Diagnosis not present

## 2018-07-24 DIAGNOSIS — I2510 Atherosclerotic heart disease of native coronary artery without angina pectoris: Secondary | ICD-10-CM | POA: Diagnosis present

## 2018-07-24 DIAGNOSIS — N183 Chronic kidney disease, stage 3 (moderate): Secondary | ICD-10-CM | POA: Diagnosis present

## 2018-07-24 DIAGNOSIS — Z90710 Acquired absence of both cervix and uterus: Secondary | ICD-10-CM | POA: Diagnosis not present

## 2018-07-24 DIAGNOSIS — Y838 Other surgical procedures as the cause of abnormal reaction of the patient, or of later complication, without mention of misadventure at the time of the procedure: Secondary | ICD-10-CM | POA: Diagnosis present

## 2018-07-24 DIAGNOSIS — N390 Urinary tract infection, site not specified: Secondary | ICD-10-CM | POA: Diagnosis present

## 2018-07-24 DIAGNOSIS — I129 Hypertensive chronic kidney disease with stage 1 through stage 4 chronic kidney disease, or unspecified chronic kidney disease: Secondary | ICD-10-CM | POA: Diagnosis present

## 2018-07-24 DIAGNOSIS — Z79891 Long term (current) use of opiate analgesic: Secondary | ICD-10-CM | POA: Diagnosis not present

## 2018-07-24 DIAGNOSIS — G629 Polyneuropathy, unspecified: Secondary | ICD-10-CM | POA: Diagnosis present

## 2018-07-24 DIAGNOSIS — Z419 Encounter for procedure for purposes other than remedying health state, unspecified: Secondary | ICD-10-CM

## 2018-07-24 DIAGNOSIS — D631 Anemia in chronic kidney disease: Secondary | ICD-10-CM | POA: Diagnosis present

## 2018-07-24 DIAGNOSIS — Z96653 Presence of artificial knee joint, bilateral: Secondary | ICD-10-CM | POA: Diagnosis present

## 2018-07-24 DIAGNOSIS — Z9359 Other cystostomy status: Secondary | ICD-10-CM | POA: Diagnosis not present

## 2018-07-24 HISTORY — PX: TOTAL HIP ARTHROPLASTY WITH HARDWARE REMOVAL: SHX6438

## 2018-07-24 LAB — CBC WITH DIFFERENTIAL/PLATELET
Basophils Absolute: 0 10*3/uL (ref 0–0.1)
Basophils Relative: 1 %
EOS ABS: 0.4 10*3/uL (ref 0–0.7)
EOS PCT: 5 %
HCT: 27.3 % — ABNORMAL LOW (ref 35.0–47.0)
Hemoglobin: 9 g/dL — ABNORMAL LOW (ref 12.0–16.0)
LYMPHS ABS: 1.2 10*3/uL (ref 1.0–3.6)
Lymphocytes Relative: 16 %
MCH: 29.5 pg (ref 26.0–34.0)
MCHC: 32.8 g/dL (ref 32.0–36.0)
MCV: 89.8 fL (ref 80.0–100.0)
MONO ABS: 0.8 10*3/uL (ref 0.2–0.9)
Monocytes Relative: 11 %
Neutro Abs: 5.1 10*3/uL (ref 1.4–6.5)
Neutrophils Relative %: 67 %
PLATELETS: 312 10*3/uL (ref 150–440)
RBC: 3.05 MIL/uL — AB (ref 3.80–5.20)
RDW: 16.3 % — AB (ref 11.5–14.5)
WBC: 7.5 10*3/uL (ref 3.6–11.0)

## 2018-07-24 LAB — URINALYSIS, COMPLETE (UACMP) WITH MICROSCOPIC
BILIRUBIN URINE: NEGATIVE
Glucose, UA: NEGATIVE mg/dL
KETONES UR: NEGATIVE mg/dL
Nitrite: POSITIVE — AB
PROTEIN: 30 mg/dL — AB
Specific Gravity, Urine: 1.01 (ref 1.005–1.030)
pH: 5 (ref 5.0–8.0)

## 2018-07-24 LAB — PREPARE RBC (CROSSMATCH)

## 2018-07-24 LAB — BASIC METABOLIC PANEL
Anion gap: 10 (ref 5–15)
BUN: 31 mg/dL — AB (ref 8–23)
CHLORIDE: 100 mmol/L (ref 98–111)
CO2: 26 mmol/L (ref 22–32)
Calcium: 8.9 mg/dL (ref 8.9–10.3)
Creatinine, Ser: 1.18 mg/dL — ABNORMAL HIGH (ref 0.44–1.00)
GFR calc Af Amer: 47 mL/min — ABNORMAL LOW (ref >60)
GFR calc non Af Amer: 41 mL/min — ABNORMAL LOW (ref >60)
GLUCOSE: 145 mg/dL — AB (ref 70–99)
Potassium: 3.8 mmol/L (ref 3.5–5.1)
SODIUM: 136 mmol/L (ref 135–145)

## 2018-07-24 LAB — PROTIME-INR
INR: 1.12
PROTHROMBIN TIME: 14.3 s (ref 11.4–15.2)

## 2018-07-24 LAB — APTT: aPTT: 36 seconds (ref 24–36)

## 2018-07-24 LAB — SURGICAL PCR SCREEN
MRSA, PCR: NEGATIVE
STAPHYLOCOCCUS AUREUS: NEGATIVE

## 2018-07-24 LAB — ABO/RH: ABO/RH(D): O POS

## 2018-07-24 LAB — TSH: TSH: 2.446 u[IU]/mL (ref 0.350–4.500)

## 2018-07-24 SURGERY — REVISION, ARTHROPLASTY, HIP
Anesthesia: General | Site: Hip | Laterality: Left

## 2018-07-24 MED ORDER — ONDANSETRON HCL 4 MG/2ML IJ SOLN: 4.0000 mg | Freq: Four times a day (QID) | INTRAMUSCULAR | Status: DC | PRN

## 2018-07-24 MED ORDER — FLEET ENEMA 7-19 GM/118ML RE ENEM: 1.0000 | ENEMA | Freq: Once | RECTAL | Status: DC | PRN

## 2018-07-24 MED ORDER — FENTANYL CITRATE (PF) 100 MCG/2ML IJ SOLN: 25.0000 ug | INTRAMUSCULAR | Status: DC | PRN

## 2018-07-24 MED ORDER — MAGNESIUM HYDROXIDE 400 MG/5ML PO SUSP: 30.0000 mL | Freq: Every day | ORAL | Status: DC | PRN

## 2018-07-24 MED ORDER — ALBUMIN HUMAN 5 % IV SOLN
INTRAVENOUS | Status: AC
  Filled 2018-07-24: qty 250

## 2018-07-24 MED ORDER — DOCUSATE SODIUM 100 MG PO CAPS
100.0000 mg | ORAL_CAPSULE | Freq: Two times a day (BID) | ORAL | Status: DC
  Administered 2018-07-25: 100 mg via ORAL
  Filled 2018-07-24: qty 1

## 2018-07-24 MED ORDER — ROCURONIUM BROMIDE 100 MG/10ML IV SOLN
INTRAVENOUS | Status: DC | PRN
  Administered 2018-07-24 (×2): 20 mg via INTRAVENOUS
  Administered 2018-07-24: 10 mg via INTRAVENOUS

## 2018-07-24 MED ORDER — OXYCODONE HCL 5 MG PO TABS: 5.0000 mg | ORAL_TABLET | ORAL | Status: DC | PRN

## 2018-07-24 MED ORDER — AMLODIPINE BESYLATE 10 MG PO TABS
10.0000 mg | ORAL_TABLET | Freq: Every day | ORAL | Status: DC
  Administered 2018-07-25: 10 mg via ORAL
  Filled 2018-07-24: qty 1

## 2018-07-24 MED ORDER — PHENYLEPHRINE HCL 10 MG/ML IJ SOLN
INTRAMUSCULAR | Status: DC | PRN
  Administered 2018-07-24 (×3): 100 ug via INTRAVENOUS
  Administered 2018-07-24: 80 ug via INTRAVENOUS

## 2018-07-24 MED ORDER — ONDANSETRON HCL 4 MG PO TABS: 4.0000 mg | ORAL_TABLET | Freq: Four times a day (QID) | ORAL | Status: DC | PRN

## 2018-07-24 MED ORDER — FENTANYL CITRATE (PF) 100 MCG/2ML IJ SOLN
INTRAMUSCULAR | Status: AC
  Filled 2018-07-24: qty 2

## 2018-07-24 MED ORDER — CEFAZOLIN SODIUM-DEXTROSE 2-4 GM/100ML-% IV SOLN
2.0000 g | Freq: Two times a day (BID) | INTRAVENOUS | Status: DC
  Administered 2018-07-25 (×2): 2 g via INTRAVENOUS
  Filled 2018-07-24 (×3): qty 100

## 2018-07-24 MED ORDER — ENOXAPARIN SODIUM 30 MG/0.3ML ~~LOC~~ SOLN
30.0000 mg | SUBCUTANEOUS | Status: DC
  Administered 2018-07-25: 30 mg via SUBCUTANEOUS
  Filled 2018-07-24: qty 0.3

## 2018-07-24 MED ORDER — PANTOPRAZOLE SODIUM 40 MG PO TBEC
40.0000 mg | DELAYED_RELEASE_TABLET | Freq: Every day | ORAL | Status: DC
  Administered 2018-07-25: 40 mg via ORAL
  Filled 2018-07-24: qty 1

## 2018-07-24 MED ORDER — ACETAMINOPHEN 650 MG RE SUPP: 650.0000 mg | Freq: Four times a day (QID) | RECTAL | Status: DC | PRN

## 2018-07-24 MED ORDER — FENTANYL CITRATE (PF) 100 MCG/2ML IJ SOLN
50.0000 ug | INTRAMUSCULAR | Status: DC | PRN
  Administered 2018-07-24: 50 ug via INTRAVENOUS
  Filled 2018-07-24: qty 2

## 2018-07-24 MED ORDER — BISACODYL 5 MG PO TBEC: 5.0000 mg | DELAYED_RELEASE_TABLET | Freq: Every day | ORAL | Status: DC | PRN

## 2018-07-24 MED ORDER — SUGAMMADEX SODIUM 200 MG/2ML IV SOLN
INTRAVENOUS | Status: DC | PRN
  Administered 2018-07-24: 120 mg via INTRAVENOUS

## 2018-07-24 MED ORDER — VITAMIN D3 25 MCG (1000 UNIT) PO TABS
1000.0000 [IU] | ORAL_TABLET | Freq: Every day | ORAL | Status: DC
  Administered 2018-07-25: 1000 [IU] via ORAL
  Filled 2018-07-24 (×2): qty 1

## 2018-07-24 MED ORDER — PROPOFOL 10 MG/ML IV BOLUS
INTRAVENOUS | Status: DC | PRN
  Administered 2018-07-24: 90 mg via INTRAVENOUS

## 2018-07-24 MED ORDER — METOCLOPRAMIDE HCL 5 MG/ML IJ SOLN: 5.0000 mg | Freq: Three times a day (TID) | INTRAMUSCULAR | Status: DC | PRN

## 2018-07-24 MED ORDER — SODIUM CHLORIDE 0.9 % IV SOLN
1.0000 g | INTRAVENOUS | Status: DC
  Filled 2018-07-24: qty 10

## 2018-07-24 MED ORDER — FERROUS SULFATE 325 (65 FE) MG PO TABS
325.0000 mg | ORAL_TABLET | Freq: Two times a day (BID) | ORAL | Status: DC
  Administered 2018-07-25: 325 mg via ORAL
  Filled 2018-07-24: qty 1

## 2018-07-24 MED ORDER — METOCLOPRAMIDE HCL 10 MG PO TABS: 5.0000 mg | ORAL_TABLET | Freq: Three times a day (TID) | ORAL | Status: DC | PRN

## 2018-07-24 MED ORDER — FENTANYL CITRATE (PF) 100 MCG/2ML IJ SOLN
INTRAMUSCULAR | Status: DC | PRN
  Administered 2018-07-24 (×4): 25 ug via INTRAVENOUS

## 2018-07-24 MED ORDER — TRAMADOL HCL 50 MG PO TABS: 50.0000 mg | ORAL_TABLET | Freq: Four times a day (QID) | ORAL | Status: DC | PRN

## 2018-07-24 MED ORDER — ACETAMINOPHEN 325 MG PO TABS: 650.0000 mg | ORAL_TABLET | Freq: Four times a day (QID) | ORAL | Status: DC | PRN

## 2018-07-24 MED ORDER — ADULT MULTIVITAMIN W/MINERALS CH
1.0000 | ORAL_TABLET | Freq: Every day | ORAL | Status: DC
  Administered 2018-07-25: 1 via ORAL
  Filled 2018-07-24: qty 1

## 2018-07-24 MED ORDER — ONDANSETRON HCL 4 MG/2ML IJ SOLN: 4.0000 mg | Freq: Once | INTRAMUSCULAR | Status: DC | PRN

## 2018-07-24 MED ORDER — ACETAMINOPHEN 325 MG PO TABS: 325.0000 mg | ORAL_TABLET | Freq: Four times a day (QID) | ORAL | Status: DC | PRN

## 2018-07-24 MED ORDER — LOSARTAN POTASSIUM 50 MG PO TABS
100.0000 mg | ORAL_TABLET | Freq: Every day | ORAL | Status: DC
  Administered 2018-07-25: 100 mg via ORAL
  Filled 2018-07-24: qty 2

## 2018-07-24 MED ORDER — ACETAMINOPHEN 500 MG PO TABS
1000.0000 mg | ORAL_TABLET | Freq: Four times a day (QID) | ORAL | Status: DC
  Administered 2018-07-25 (×2): 1000 mg via ORAL
  Filled 2018-07-24 (×2): qty 2

## 2018-07-24 MED ORDER — MORPHINE SULFATE (PF) 2 MG/ML IV SOLN
2.0000 mg | INTRAVENOUS | Status: DC | PRN
  Administered 2018-07-24: 2 mg via INTRAVENOUS
  Filled 2018-07-24: qty 1

## 2018-07-24 MED ORDER — SODIUM CHLORIDE 0.9 % IV SOLN
INTRAVENOUS | Status: DC
  Administered 2018-07-24: 16:00:00 via INTRAVENOUS

## 2018-07-24 MED ORDER — CEFAZOLIN SODIUM-DEXTROSE 2-3 GM-%(50ML) IV SOLR
INTRAVENOUS | Status: DC | PRN
  Administered 2018-07-24: 2 g via INTRAVENOUS

## 2018-07-24 MED ORDER — BISACODYL 10 MG RE SUPP: 10.0000 mg | Freq: Every day | RECTAL | Status: DC | PRN

## 2018-07-24 MED ORDER — ATENOLOL 25 MG PO TABS
25.0000 mg | ORAL_TABLET | Freq: Every day | ORAL | Status: DC
  Administered 2018-07-25: 25 mg via ORAL
  Filled 2018-07-24: qty 1

## 2018-07-24 MED ORDER — EPHEDRINE SULFATE 50 MG/ML IJ SOLN
INTRAMUSCULAR | Status: DC | PRN
  Administered 2018-07-24 (×2): 10 mg via INTRAVENOUS

## 2018-07-24 MED ORDER — HYDROMORPHONE HCL 1 MG/ML IJ SOLN: 0.5000 mg | INTRAMUSCULAR | Status: DC | PRN

## 2018-07-24 MED ORDER — TRAMADOL HCL 50 MG PO TABS
50.0000 mg | ORAL_TABLET | Freq: Three times a day (TID) | ORAL | Status: DC | PRN
  Administered 2018-07-25: 50 mg via ORAL
  Filled 2018-07-24: qty 1

## 2018-07-24 MED ORDER — PROPOFOL 10 MG/ML IV BOLUS
INTRAVENOUS | Status: AC
  Filled 2018-07-24: qty 20

## 2018-07-24 MED ORDER — DIPHENHYDRAMINE HCL 12.5 MG/5ML PO ELIX: 12.5000 mg | ORAL_SOLUTION | ORAL | Status: DC | PRN

## 2018-07-24 MED ORDER — SODIUM CHLORIDE 0.9 % IV SOLN
1.0000 g | Freq: Once | INTRAVENOUS | Status: AC
  Administered 2018-07-24: 1 g via INTRAVENOUS
  Filled 2018-07-24: qty 10

## 2018-07-24 MED ORDER — SODIUM CHLORIDE 0.9 % IV SOLN
INTRAVENOUS | Status: DC | PRN
  Administered 2018-07-24: 20 ug/min via INTRAVENOUS

## 2018-07-24 MED ORDER — TRANEXAMIC ACID 1000 MG/10ML IV SOLN
INTRAVENOUS | Status: DC | PRN
  Administered 2018-07-24: 1000 mg via TOPICAL

## 2018-07-24 MED ORDER — SODIUM CHLORIDE 0.9% IV SOLUTION: Freq: Once | INTRAVENOUS | Status: DC

## 2018-07-24 MED ORDER — GABAPENTIN 300 MG PO CAPS
300.0000 mg | ORAL_CAPSULE | Freq: Three times a day (TID) | ORAL | Status: DC
  Filled 2018-07-24: qty 1

## 2018-07-24 MED ORDER — ALBUMIN HUMAN 5 % IV SOLN
INTRAVENOUS | Status: DC | PRN
  Administered 2018-07-24: 21:00:00 via INTRAVENOUS

## 2018-07-24 MED ORDER — SUCCINYLCHOLINE CHLORIDE 20 MG/ML IJ SOLN
INTRAMUSCULAR | Status: DC | PRN
  Administered 2018-07-24: 100 mg via INTRAVENOUS

## 2018-07-24 MED ORDER — ALBUMIN HUMAN 5 % IV SOLN: 12.5000 g | Freq: Once | INTRAVENOUS | Status: DC

## 2018-07-24 MED ORDER — SODIUM CHLORIDE 0.9 % IV SOLN
INTRAVENOUS | Status: DC | PRN
  Administered 2018-07-24 (×2): via INTRAVENOUS

## 2018-07-24 MED ORDER — NEOMYCIN-POLYMYXIN B GU 40-200000 IR SOLN
Status: DC | PRN
  Administered 2018-07-24: 12 mL

## 2018-07-24 MED ORDER — LIDOCAINE HCL (CARDIAC) PF 100 MG/5ML IV SOSY
PREFILLED_SYRINGE | INTRAVENOUS | Status: DC | PRN
  Administered 2018-07-24: 100 mg via INTRAVENOUS

## 2018-07-24 SURGICAL SUPPLY — 57 items
BLADE SAGITTAL WIDE XTHICK NO (BLADE) ×3 IMPLANT
BLADE SURG SZ20 CARB STEEL (BLADE) ×3 IMPLANT
CANISTER SUCT 1200ML W/VALVE (MISCELLANEOUS) ×3 IMPLANT
CANISTER SUCT 3000ML PPV (MISCELLANEOUS) ×6 IMPLANT
CHLORAPREP W/TINT 26ML (MISCELLANEOUS) ×3 IMPLANT
DRAPE IMP U-DRAPE 54X76 (DRAPES) ×3 IMPLANT
DRAPE INCISE IOBAN 66X60 STRL (DRAPES) ×3 IMPLANT
DRAPE SHEET LG 3/4 BI-LAMINATE (DRAPES) ×3 IMPLANT
DRAPE SURG 17X11 SM STRL (DRAPES) ×6 IMPLANT
DRAPE TABLE BACK 80X90 (DRAPES) ×3 IMPLANT
DRSG OPSITE POSTOP 4X10 (GAUZE/BANDAGES/DRESSINGS) ×3 IMPLANT
DRSG OPSITE POSTOP 4X12 (GAUZE/BANDAGES/DRESSINGS) ×3 IMPLANT
DRSG OPSITE POSTOP 4X6 (GAUZE/BANDAGES/DRESSINGS) ×6 IMPLANT
ELECT BLADE 6.5 EXT (BLADE) ×3 IMPLANT
ELECT CAUTERY BLADE 6.4 (BLADE) ×3 IMPLANT
GLOVE BIO SURGEON STRL SZ7.5 (GLOVE) ×12 IMPLANT
GLOVE BIO SURGEON STRL SZ8 (GLOVE) ×12 IMPLANT
GLOVE BIOGEL PI IND STRL 8 (GLOVE) ×1 IMPLANT
GLOVE BIOGEL PI INDICATOR 8 (GLOVE) ×2
GLOVE INDICATOR 8.0 STRL GRN (GLOVE) ×3 IMPLANT
GOWN STRL REUS W/ TWL LRG LVL3 (GOWN DISPOSABLE) ×1 IMPLANT
GOWN STRL REUS W/ TWL XL LVL3 (GOWN DISPOSABLE) ×1 IMPLANT
GOWN STRL REUS W/TWL LRG LVL3 (GOWN DISPOSABLE) ×2
GOWN STRL REUS W/TWL XL LVL3 (GOWN DISPOSABLE) ×2
HANDLE YANKAUER SUCT BULB TIP (MISCELLANEOUS) ×3 IMPLANT
HEAD MOD COCR 32MM HD -6MM NK (Orthopedic Implant) ×3 IMPLANT
HOOD PEEL AWAY FLYTE STAYCOOL (MISCELLANEOUS) ×9 IMPLANT
KIT TURNOVER KIT A (KITS) ×3 IMPLANT
LINER ACETABULAR 32 C G7 (Liner) IMPLANT
LINER ACETABULAR G7 SZ32 C (Liner) ×3 IMPLANT
MAT ABSORB  FLUID 56X50 GRAY (MISCELLANEOUS) ×2
MAT ABSORB FLUID 56X50 GRAY (MISCELLANEOUS) ×1 IMPLANT
NDL SAFETY ECLIPSE 18X1.5 (NEEDLE) ×2 IMPLANT
NEEDLE FILTER BLUNT 18X 1/2SAF (NEEDLE) ×2
NEEDLE FILTER BLUNT 18X1 1/2 (NEEDLE) ×1 IMPLANT
NEEDLE HYPO 18GX1.5 SHARP (NEEDLE) ×4
NEEDLE SPNL 20GX3.5 QUINCKE YW (NEEDLE) ×3 IMPLANT
PACK HIP PROSTHESIS (MISCELLANEOUS) ×3 IMPLANT
PILLOW ABDUCTION FOAM SM (MISCELLANEOUS) ×3 IMPLANT
PIN STEINMAN 3/16 (PIN) ×3 IMPLANT
PULSAVAC PLUS IRRIG FAN TIP (DISPOSABLE) ×3
SOL .9 NS 3000ML IRR  AL (IV SOLUTION) ×2
SOL .9 NS 3000ML IRR UROMATIC (IV SOLUTION) ×1 IMPLANT
SPONGE LAP 18X18 RF (DISPOSABLE) ×3 IMPLANT
STAPLER SKIN PROX 35W (STAPLE) ×3 IMPLANT
STEM COLLARLESS FULL 14X150MM (Stem) ×3 IMPLANT
SUT TICRON 2-0 30IN 311381 (SUTURE) ×12 IMPLANT
SUT VIC AB 0 CT1 36 (SUTURE) ×3 IMPLANT
SUT VIC AB 1 CT1 36 (SUTURE) ×6 IMPLANT
SUT VIC AB 2-0 CT1 (SUTURE) ×18 IMPLANT
SYR 10ML LL (SYRINGE) ×3 IMPLANT
SYR 20CC LL (SYRINGE) ×3 IMPLANT
SYR 30ML LL (SYRINGE) ×9 IMPLANT
TAPE TRANSPORE STRL 2 31045 (GAUZE/BANDAGES/DRESSINGS) ×3 IMPLANT
TIP FAN IRRIG PULSAVAC PLUS (DISPOSABLE) ×1 IMPLANT
TRAY FOLEY MTR SLVR 16FR STAT (SET/KITS/TRAYS/PACK) ×3 IMPLANT
WATER STERILE IRR 1000ML POUR (IV SOLUTION) ×3 IMPLANT

## 2018-07-24 NOTE — Anesthesia Post-op Follow-up Note (Signed)
Anesthesia QCDR form completed.        

## 2018-07-24 NOTE — Op Note (Signed)
07/24/2018  9:33 PM  Patient:   [name]  Pre-Op Diagnosis:   Displaced left femoral neck fracture with advanced degenerative joint disease, left hip.  Post-Op Diagnosis:   Subacute displaced left femoral neck fracture with advanced degenerative joint disease, left hip.  Procedure:   Left total hip arthroplasty with hardware removal, left hip  Surgeon:   Pascal Lux, MD  Assistant:   Cameron Proud, PA-C  Anesthesia:   GET  Findings:   As above.  Complications:   None  EBL:   500 cc  Fluids:   1800 cc crystalloid, 250 cc of albumin, 1 unit PRBC  UOP:   350 cc  TT:   None  Drains:   None  Closure:   Staples  Implants:   Biomet press-fit system with a #14 laterally offset Echo femoral stem, a 48 mm acetabular shell with an E-poly hi-wall liner, and a 32 mm chrome cobalt head with a -6 mm neck.  Brief Clinical Note:   The patient is an 84 year old female who apparently sustained the above-noted injury earlier today while being transferred from her bed to her wheelchair by her son. She was brought to the emergency room where x-rays demonstrated the above-noted injury. The patient has been cleared medically and presents this time for a left total hip arthroplasty. Of note, the patient is status post the placement of a supracondylar plate for a displaced supracondylar left femur fracture 2 years ago. The proximal end of the plate extends into the zone of the femoral stem.   Procedure:   The patient was brought into the operating room and lain in the supine position. After adequate general endotracheal intubation and anesthesia was obtained, the patient was repositioned in the right lateral decubitus position and secured using a lateral hip positioner. The left hip and lower extremity were prepped with ChloroPrep solution before being draped sterilely. Preoperative antibiotics were administered. A timeout was performed to verify the appropriate surgical site before a standard  posterior approach to the hip was made through an approximately 7-8 inch incision. The incision was carried down through the subcutaneous tissues to expose the gluteal fascia and proximal end of the iliotibial band. These structures were split the length of the incision and the Charnley self-retaining hip retractor placed. The bursal tissues were noted to be quite thickened and consistent with fibrous hematoma and early fracture healing.  These tissues were divided at the posterior margin of the greater trochanter and retracted posteriorly to expose the short external rotators. The anterior border of the piriformis tendon was identified and this plane developed down through the capsule to enter the joint. A flap of tissue was elevated off the posterior aspect of the femoral neck and greater trochanter and retracted posteriorly. This flap included the piriformis tendon, the short external rotators, and the posterior capsule. The soft tissues were elevated off the lateral aspect of the ilium and a large Steinmann pin placed bicortically. With the left leg aligned over the right, a drill bit was placed into the greater trochanter parallel to the Steinmann pin and the distance between these two pins measured in order to optimize leg lengths postoperatively. The drill bit was removed and the hip dislocated through the fracture.  A fresh femoral neck cut was made using the external guide and oscillating saw. This piece of neck was removed to facilitate exposure of the femoral head remaining in the acetabulum. There was very little in the way of fracture hematoma to  suggest an acute injury. Rather there appear to be significant fibrinous tissue suggesting a more subacute process. The femoral head was noted to have very little if any motion and appeared to be scarred into the acetabulum. The head was removed piecemeal using osteotomes, rongeurs, and, toward the end, a small acetabular reamer. These pieces of bone were  saved for later bone grafting.   Using the appropriate guide, a femoral neck cut was made 10-12 mm above the lesser trochanter. The femoral head was removed.  A line was drawn on the drapes corresponding to the native version of the acetabulum. This line was used as a guide while the acetabulum was reamed sequentially beginning with a 40 mm reamer and progressing to a 47 mm reamer. This provided excellent circumferential chatter. The 47 mm trial acetabulum was positioned and found to fit quite well. Therefore, after impacting the reamings and harvested pieces of the femoral head and reestablishing the acetabulum with a 46 mm reamer placed on reverse, the 48 mm acetabular shell was selected and impacted into place with care taken to maintain the appropriate version.  Good fixation appeared to be achieved. The trial high wall liner was inserted.  Attention was redirected to the femoral side. The piriformis fossa was debrided of soft tissues before the intramedullary canal was accessed through this point using a triple step reamer. The canal was reamed sequentially beginning with a #7 tapered reamer and progressing to a #14 tapered reamer. However, when the 10 mm reamer was inserted, it contacted the proximal screw of the plate. Therefore, a separate 4-5 cm incision was made more distally over the proximal end of the plate.  This incision was carried down to the subcutaneous tissues to identify and expose the proximal end of the plate. Each of the two most proximal screws were removed using the appropriate screwdriver.    Once the screws were removed, sequential reaming was continued up to the #14 tapered reamer.  This provided excellent circumferential chatter. A box osteotome was used to establish version before the canal was broached sequentially beginning with a #7 broach and progressing to a #14broach. This was left in place and several trial reductions performed using both a standard and laterally offset  neck options, as well as the -6 mm neck length. The permanent E-polyethylene hi-wall liner was impacted into the acetabular shell and its locking mechanism verified using a quarter-inch osteotome. Next, the #14 laterally offset femoral stem was impacted into place with care taken to maintain the appropriate version. A repeat trial reduction was performed using the -6 mm neck length. The -6 mm neck length demonstrated excellent stability both in extension and external rotation as well as with flexion to 70 and internal rotation beyond 60. The patient could not be flexed beyond 70 degrees due to intrinsic tightness of her soft tissues. The hip also was stable in the position of sleep. In addition, leg lengths appeared to be restored appropriately. The 32 mm chrome cobalt head with the -6 mm neck length was impacted onto the stem of the femoral component. The Morse taper locking mechanism was verified using manual distraction before the head was relocated and placed through a range of motion with the findings as described above.  The wounds were copiously irrigated with bacitracin saline solution via the jet lavage system before the posterior flap was reapproximated to the posterior aspect of the greater trochanter using #2 Tycron interrupted sutures placed through drill holes. Several additional #2 Tycron interrupted sutures  were used to reinforce this layer of closure. The iliotibial band was reapproximated using #1 Vicryl interrupted sutures before the gluteal fascia was closed using a running #1 Vicryl suture. At this point, 1 g of transexemic acid in 10 cc of normal saline was injected into the joint to help reduce postoperative bleeding. The subcutaneous tissues were closed in several layers using 2-0 Vicryl interrupted sutures before the skin was closed using staples. The more distal incision also was closed using #0 and 2-0 Vicryl interrupted sutures before the skin was closed using staples. Sterile  occlusive dressings were applied to the wound before the patient was placed into an abduction wedge pillow. The patient was then rolled back into the supine position on her hospital bed before being awakened and returned to the recovery room in satisfactory condition after tolerating the procedure well.

## 2018-07-24 NOTE — Progress Notes (Signed)
   [facility] Day: 0 days [name] is a 84 y.o. female presenting with Hip Pain .   Advance care planning discussed with patient at bedside. All questions in regards to overall condition and expected prognosis answered. She said that she has expressed in the past that she would not want to be resuscitated if her heart were to stop.  She has a DNR form signed in the chart.  The decision was made to  Continue current code status  CODE STATUS: DNR Time spent: 18 minutes

## 2018-07-24 NOTE — NC FL2 (Signed)
[place] LEVEL OF CARE SCREENING TOOL     IDENTIFICATION  Patient Name: [name] Birthdate: 04-25-1931 Sex: female Admission Date (Current Location): 07/24/2018  [facility] and [place] Number:  [name] ([id number] [facility]) Facility and Address:  [facility], [address], [place], [place] [address]      Provider Number: [id number]  Attending Physician Name and Address:  Gladstone Lighter, MD  Relative Name and Phone Number:       Current Level of Care: Hospital Recommended Level of Care: [place] Prior Approval Number:    Date Approved/Denied:   PASRR Number: [id number])  Discharge Plan: SNF    Current Diagnoses: Patient Active Problem List   Diagnosis Date Noted  . Closed left hip fracture ([place]) 07/24/2018  . Gastric foreign body   . Stricture and stenosis of esophagus   . Acute encephalopathy 12/03/2017  . Dysphagia 12/03/2017  . Pressure injury of skin 03/30/2017  . Sepsis ([place]) 03/28/2017  . Pressure ulcer 06/09/2016  . Syncope 06/08/2016  . Urinary tract infection 06/08/2016  . Periprosthetic fracture around internal prosthetic left knee joint 05/25/2016  . Fracture of femur, distal, left, closed ([place]) 05/25/2016  . Closed displaced fracture of base of fourth metacarpal bone of right hand 05/25/2016  . Closed displaced fracture of base of fifth metacarpal bone of right hand 05/25/2016  . Traumatic perinephric hematoma 05/09/2016  . SAH (subarachnoid hemorrhage) ([place]) 05/08/2016  . Trauma 05/08/2016  . SDH (subdural hematoma) ([place]) 05/08/2016  . Perinephric hematoma 05/08/2016  . Hypertension 05/08/2016  . Fractures involving multiple body regions 05/08/2016  . CKD (chronic kidney disease) 05/08/2016  . Brain edema ([place]) 05/08/2016  . Acute blood loss anemia 05/08/2016    Orientation RESPIRATION BLADDER Height & Weight     Self, Time, Situation, Place  Normal Continent Weight: 123 lb 7.3 oz (56  kg) Height:     BEHAVIORAL SYMPTOMS/MOOD NEUROLOGICAL BOWEL NUTRITION STATUS      Continent Diet(Diet: NPO for surgery to be advanced. )  AMBULATORY STATUS COMMUNICATION OF NEEDS Skin   Extensive Assist Verbally Surgical wounds                       Personal Care Assistance Level of Assistance  Bathing, Feeding, Dressing Bathing Assistance: Limited assistance Feeding assistance: Independent Dressing Assistance: Limited assistance     Functional Limitations Info  Sight, Hearing, Speech Sight Info: Adequate Hearing Info: Adequate Speech Info: Adequate    SPECIAL CARE FACTORS FREQUENCY  PT (By licensed PT), OT (By licensed OT)     PT Frequency: (5) OT Frequency: (5)            Contractures      Additional Factors Info  Code Status, Allergies Code Status Info: (DNR ) Allergies Info: (No Known Allergies. )           Current Medications (07/24/2018):  This is the current hospital active medication list Current Facility-Administered Medications  Medication Dose Route Frequency Provider Last Rate Last Dose  . 0.9 %  sodium chloride infusion   Intravenous Continuous Gladstone Lighter, MD 60 mL/hr at 07/24/18 1543    . acetaminophen (TYLENOL) tablet 650 mg  650 mg Oral Q6H PRN Gladstone Lighter, MD       Or  . acetaminophen (TYLENOL) suppository 650 mg  650 mg Rectal Q6H PRN Gladstone Lighter, MD      . Derrill Memo ON 07/25/2018] amLODipine (NORVASC) tablet 10 mg  10 mg Oral  Daily Gladstone Lighter, MD      . Derrill Memo ON 07/25/2018] atenolol (TENORMIN) tablet 25 mg  25 mg Oral Daily Gladstone Lighter, MD      . bisacodyl (DULCOLAX) EC tablet 5-10 mg  5-10 mg Oral Daily PRN Gladstone Lighter, MD      . bisacodyl (DULCOLAX) suppository 10 mg  10 mg Rectal Daily PRN Gladstone Lighter, MD      . Derrill Memo ON 07/25/2018] cefTRIAXone (ROCEPHIN) 1 g in sodium chloride 0.9 % 100 mL IVPB  1 g Intravenous Q24H Gladstone Lighter, MD      . Derrill Memo ON 07/25/2018] cholecalciferol  (VITAMIN D) tablet 1,000 Units  1,000 Units Oral Daily Gladstone Lighter, MD      . fentaNYL (SUBLIMAZE) injection 50 mcg  50 mcg Intravenous Q30 min PRN Gladstone Lighter, MD   50 mcg at 07/24/18 1145  . ferrous sulfate tablet 325 mg  325 mg Oral BID Gladstone Lighter, MD      . gabapentin (NEURONTIN) capsule 300 mg  300 mg Oral TID Gladstone Lighter, MD      . Derrill Memo ON 07/25/2018] losartan (COZAAR) tablet 100 mg  100 mg Oral Daily Gladstone Lighter, MD      . morphine 2 MG/ML injection 2 mg  2 mg Intravenous Q4H PRN Gladstone Lighter, MD   2 mg at 07/24/18 1543  . [START ON 07/25/2018] multivitamin with minerals tablet 1 tablet  1 tablet Oral Daily Gladstone Lighter, MD      . ondansetron (ZOFRAN) tablet 4 mg  4 mg Oral Q6H PRN Gladstone Lighter, MD       Or  . ondansetron (ZOFRAN) injection 4 mg  4 mg Intravenous Q6H PRN Gladstone Lighter, MD      . Derrill Memo ON 07/25/2018] pantoprazole (PROTONIX) EC tablet 40 mg  40 mg Oral Daily Gladstone Lighter, MD      . traMADol Veatrice Bourbon) tablet 50 mg  50 mg Oral Q8H PRN Gladstone Lighter, MD         Discharge Medications: Please see discharge summary for a list of discharge medications.  Relevant Imaging Results:  Relevant Lab Results:   Additional Information (SSN: [id number])  Margareta Laureano, Veronia Beets, LCSW

## 2018-07-24 NOTE — Progress Notes (Signed)
Pt admitted to unit from ED. Pt is A&Ox4, VSS. Pt has pressure injuries on bilateral heels. Pt also has suprapubic catheter; split gauze applied- did have some purulent drainage around catheter initially. Pt oriented to room and plan of care; she talked with both sisters on the phone. Bed in lowest position, call bell within reach.   [place], Jerry Caras

## 2018-07-24 NOTE — Anesthesia Procedure Notes (Signed)
Procedure Name: Intubation Date/Time: 07/24/2018 6:02 PM Performed by: Aline Brochure, CRNA Pre-anesthesia Checklist: Patient identified, Emergency Drugs available, Suction available and Patient being monitored Patient Re-evaluated:Patient Re-evaluated prior to induction Oxygen Delivery Method: Circle system utilized Preoxygenation: Pre-oxygenation with 100% oxygen Induction Type: IV induction Ventilation: Mask ventilation without difficulty Laryngoscope Size: Mac and 3 Grade View: Grade I Tube type: Oral Tube size: 7.0 mm Number of attempts: 1 Airway Equipment and Method: Stylet Placement Confirmation: ETT inserted through vocal cords under direct vision,  positive ETCO2 and breath sounds checked- equal and bilateral Secured at: 19 cm Tube secured with: Tape Dental Injury: Teeth and Oropharynx as per pre-operative assessment

## 2018-07-24 NOTE — ED Provider Notes (Addendum)
[facility] Emergency Department Provider Note   ____________________________________________    I have reviewed the triage vital signs and the nursing notes.   HISTORY  Chief Complaint Hip Pain     HPI [name] is a 84 y.o. female who presents with complaints of left hip pain.  Patient reports over the last 1 to 2 days her hip is hurt significantly, x-ray performed at nursing home demonstrated likely nondisplaced hip fracture.  No trauma or fall reported.  Patient denies other injuries.  No fevers or chills.  Has taken over-the-counter medications for pain with little improvement   Past Medical History:  Diagnosis Date  . Anemia   . Chronic kidney disease, stage 3 ([place])   . Degenerative joint disease (DJD) of lumbar spine   . Diastolic dysfunction   . Dizziness   . Edema   . ETD (eustachian tube dysfunction)   . Heartburn   . History of rectal bleeding   . HTN (hypertension)   . Hypercalcemia   . Intracranial aneurysm   . Low vision, one eye   . Nephritis and nephropathy   . Osteoporosis   . Pyuria   . Urge incontinence   . Urinary frequency   . Urinary retention     Patient Active Problem List   Diagnosis Date Noted  . Gastric foreign body   . Stricture and stenosis of esophagus   . Acute encephalopathy 12/03/2017  . Dysphagia 12/03/2017  . Pressure injury of skin 03/30/2017  . Sepsis ([place]) 03/28/2017  . Pressure ulcer 06/09/2016  . Syncope 06/08/2016  . Urinary tract infection 06/08/2016  . Periprosthetic fracture around internal prosthetic left knee joint 05/25/2016  . Fracture of femur, distal, left, closed ([place]) 05/25/2016  . Closed displaced fracture of base of fourth metacarpal bone of right hand 05/25/2016  . Closed displaced fracture of base of fifth metacarpal bone of right hand 05/25/2016  . Traumatic perinephric hematoma 05/09/2016  . SAH (subarachnoid hemorrhage) ([place]) 05/08/2016  . Trauma 05/08/2016  . SDH  (subdural hematoma) ([place]) 05/08/2016  . Perinephric hematoma 05/08/2016  . Hypertension 05/08/2016  . Fractures involving multiple body regions 05/08/2016  . CKD (chronic kidney disease) 05/08/2016  . Brain edema ([place]) 05/08/2016  . Acute blood loss anemia 05/08/2016    Past Surgical History:  Procedure Laterality Date  . dilation of esophageal web    . ESOPHAGOGASTRODUODENOSCOPY (EGD) WITH PROPOFOL N/A 12/06/2017   Procedure: ESOPHAGOGASTRODUODENOSCOPY (EGD) WITH PROPOFOL;  Surgeon: Lucilla Lame, MD;  Location: [facility] ENDOSCOPY;  Service: Endoscopy;  Laterality: N/A;  . FRACTURE SURGERY Left    femur  . IR GENERIC HISTORICAL  08/05/2016   IR CATHETER TUBE CHANGE 08/05/2016 [name], MD [facility]  . IR GENERIC HISTORICAL  09/17/2016   IR CATHETER TUBE CHANGE 09/17/2016 [facility]  . IR GENERIC HISTORICAL  11/19/2016   IR CATHETER TUBE CHANGE 11/19/2016 [name], MD [facility]  . IR GENERIC HISTORICAL  11/29/2016   IR CATHETER TUBE CHANGE 11/29/2016 [name], DO [facility]  . JOINT REPLACEMENT Bilateral   . TOTAL KNEE ARTHROPLASTY Bilateral   . VAGINAL HYSTERECTOMY  1967   ovaries also removed because of fibroida    Prior to Admission medications   Medication Sig Start Date End Date Taking? Authorizing Provider  acetaminophen (TYLENOL) 500 MG tablet Take 1,000 mg by mouth every 8 (eight) hours as needed for mild pain.    Yes [provider]  amLODipine (NORVASC) 10  MG tablet Take 10 mg by mouth daily.  12/13/16  Yes [provider]  atenolol (TENORMIN) 25 MG tablet Take 1 tablet (25 mg total) by mouth daily. 06/10/16  Yes Mody, Ulice Bold, MD  bisacodyl (DULCOLAX) 10 MG suppository Place 10 mg rectally daily as needed for moderate constipation.   Yes [provider]  bisacodyl (DULCOLAX) 5 MG EC tablet Take 5-10 mg by mouth daily as needed for moderate constipation.   Yes [provider]  cefTRIAXone (ROCEPHIN) IVPB See admin  instructions. "Rocephin 1g IM mixed w/ lidocaine 1% 2cc daily for a total of 7 days"   Yes [provider]  cholecalciferol (VITAMIN D) 1000 units tablet Take 1,000 Units by mouth daily. 11/08/17  Yes [provider]  FEROSUL 325 (65 Fe) MG tablet Take 325 mg by mouth 2 (two) times daily. 11/08/17  Yes [provider]  furosemide (LASIX) 20 MG tablet Take 20 mg by mouth daily. 11/08/17  Yes [provider]  gabapentin (NEURONTIN) 300 MG capsule Take 300 mg by mouth 3 (three) times daily. 11/08/17  Yes [provider]  losartan (COZAAR) 100 MG tablet Take 1 tablet (100 mg total) by mouth daily. 06/10/16  Yes Bettey Costa, MD  Multiple Vitamin (MULTIVITAMIN) tablet Take 1 tablet by mouth daily. 06/10/16  Yes Mody, Ulice Bold, MD  oxybutynin (DITROPAN) 5 MG tablet Take 5 mg by mouth as directed. 30 minutes prior to monthly catheter replacemeent 12/04/16  Yes [provider]  pantoprazole (PROTONIX) 40 MG tablet Take 40 mg by mouth daily.   Yes [provider]  traMADol (ULTRAM) 50 MG tablet Take 1 tablet by mouth every 8 (eight) hours as needed for moderate pain.  02/23/18  Yes [provider]  feeding supplement, ENSURE ENLIVE, (ENSURE ENLIVE) LIQD Take 237 mLs by mouth 3 (three) times daily between meals. Patient not taking: Reported on 07/24/2018 03/30/17   Bettey Costa, MD     Allergies Patient has no known allergies.  Family History  Problem Relation Age of Onset  . Kidney disease Neg Hx   . Bladder Cancer Neg Hx   . Kidney cancer Neg Hx     Social History Social History   Tobacco Use  . Smoking status: Former Smoker    Last attempt to quit: 07/23/1964    Years since quitting: 54.0  . Smokeless tobacco: Never Used  . Tobacco comment: quit 1965  Substance Use Topics  . Alcohol use: No    Alcohol/week: 0.0 standard drinks  . Drug use: No    Review of Systems  Constitutional: No fever/chills Eyes: No visual changes.  ENT:  No neck pain Cardiovascular: Denies chest pain. Respiratory: Denies shortness of breath. Gastrointestinal: No abdominal pain.    Genitourinary: Some dysuria Musculoskeletal: Hip pain as above Skin: Negative for rash. Neurological: Negative for headaches   ____________________________________________   PHYSICAL EXAM:  VITAL SIGNS: ED Triage Vitals [07/24/18 1056]  Enc Vitals Group     BP (!) 103/54     Pulse Rate 83     Resp 18     Temp 98.9 F (37.2 C)     Temp Source Oral     SpO2 99 %     Weight 56 kg (123 lb 7.3 oz)     Height      Head Circumference      Peak Flow      Pain Score 5     Pain Loc      Pain  Edu?      Excl. in [place]?     Constitutional: Alert and oriented.  Eyes: Conjunctivae are normal.  Head: Atraumatic. Nose: No congestion/rhinnorhea. Mouth/Throat: Mucous membranes are moist.   Neck: No vertebral chest palpation Cardiovascular: Normal rate, regular rhythm.  Good peripheral circulation. Respiratory: Normal respiratory effort.  No retractions. Lungs CTAB. Gastrointestinal: Soft and nontender. No distention.  .  Musculoskeletal: Mild tenderness to palpation left lateral hip, no skin break, warm and well perfused distally Neurologic:  Normal speech and language. No gross focal neurologic deficits are appreciated.  Skin:  Skin is warm, dry and intact. No rash noted. Psychiatric: Mood and affect are normal. Speech and behavior are normal.  ____________________________________________   LABS (all labs ordered are listed, but only abnormal results are displayed)  Labs Reviewed  BASIC METABOLIC PANEL - Abnormal; Notable for the following components:      Result Value   Glucose, Bld 145 (*)    BUN 31 (*)    Creatinine, Ser 1.18 (*)    GFR calc non Af Amer 41 (*)    GFR calc Af Amer 47 (*)    All other components within normal limits  CBC WITH DIFFERENTIAL/PLATELET - Abnormal; Notable for the following components:   RBC 3.05 (*)    Hemoglobin  9.0 (*)    HCT 27.3 (*)    RDW 16.3 (*)    All other components within normal limits  URINALYSIS, COMPLETE (UACMP) WITH MICROSCOPIC - Abnormal; Notable for the following components:   Color, Urine YELLOW (*)    APPearance CLOUDY (*)    Hgb urine dipstick MODERATE (*)    Protein, ur 30 (*)    Nitrite POSITIVE (*)    Leukocytes, UA LARGE (*)    WBC, UA >50 (*)    Bacteria, UA RARE (*)    All other components within normal limits  URINE CULTURE  PROTIME-INR  APTT  TYPE AND SCREEN   ____________________________________________  EKG  ED ECG REPORT I, Lavonia Drafts, the attending physician, personally viewed and interpreted this ECG.  Date: 07/24/2018  Rhythm: normal sinus rhythm QRS Axis: normal Intervals: Abnormal ST/T Wave abnormalities: normal Narrative Interpretation: no evidence of acute ischemia  ____________________________________________  RADIOLOGY  Repeat x-ray pending ____________________________________________   PROCEDURES  Procedure(s) performed: No  Procedures   Critical Care performed: No ____________________________________________   INITIAL IMPRESSION / ASSESSMENT AND PLAN / ED COURSE  Pertinent labs & imaging results that were available during my care of the patient were reviewed by me and considered in my medical decision making (see chart for details).  Patient presents with reported nondisplaced hip fracture, will repeat x-ray, check labs, urinalysis give IV fentanyl and reevaluate  Urinalysis consistent with significant UTI, will treat with Rocephin, pending x-ray   X-ray demonstrates left displaced hip fracture, discussed with Dr. Roland Rack, he anticipates surgery in the morning  Admitted to the hospital service   ----------------------------------------- 1:18 PM on 07/24/2018 -----------------------------------------  Dr. Roland Rack reports he can actually do the surgery at 5 PM today     ____________________________________________   FINAL CLINICAL IMPRESSION(S) / ED DIAGNOSES  Final diagnoses:  Closed fracture of left hip, initial encounter [facility])  Lower urinary tract infectious disease        Note:  This document was prepared using Dragon voice recognition software and may include unintentional dictation errors.    Lavonia Drafts, MD 07/24/18 1310    Lavonia Drafts, MD 07/24/18 1318    Lavonia Drafts, MD 07/24/18  1359  

## 2018-07-24 NOTE — ED Notes (Signed)
Redrew and sent type and screen

## 2018-07-24 NOTE — Anesthesia Preprocedure Evaluation (Signed)
Anesthesia Evaluation  Patient identified by MRN, date of birth, ID band Patient awake    Reviewed: Allergy & Precautions, H&P , NPO status , Patient's Chart, lab work & pertinent test results, reviewed documented beta blocker date and time   Airway Mallampati: II   Neck ROM: full    Dental  (+) Poor Dentition   Pulmonary neg pulmonary ROS, former smoker,    Pulmonary exam normal        Cardiovascular Exercise Tolerance: Poor hypertension, On Medications, Pt. on medications and Pt. on home beta blockers + Peripheral Vascular Disease  negative cardio ROS Normal cardiovascular exam Rhythm:regular Rate:Normal     Neuro/Psych negative psych ROS   GI/Hepatic negative GI ROS, Neg liver ROS,   Endo/Other  negative endocrine ROSneg diabetesBS noted with no report of DM.  BS 61 and without sx.  Will recheck BS after procedure.  Hold D50 .Marland Kitchen JA  Renal/GU Renal diseasenegative Renal ROS  negative genitourinary   Musculoskeletal  (+) Arthritis ,   Abdominal   Peds negative pediatric ROS (+)  Hematology negative hematology ROS (+) anemia ,   Anesthesia Other Findings Past Medical History: No date: Anemia No date: Chronic kidney disease, stage 3 (HCC) No date: Degenerative joint disease (DJD) of lumbar spine No date: Diastolic dysfunction No date: Dizziness No date: Edema No date: ETD (eustachian tube dysfunction) No date: Heartburn No date: History of rectal bleeding No date: HTN (hypertension) No date: Hypercalcemia No date: Intracranial aneurysm No date: Low vision, one eye No date: Nephritis and nephropathy No date: Osteoporosis No date: Pyuria No date: Urge incontinence No date: Urinary frequency No date: Urinary retention Past Surgical History: No date: dilation of esophageal web No date: FRACTURE SURGERY; Left     Comment:  femur 08/05/2016: IR GENERIC HISTORICAL     Comment:  IR CATHETER TUBE CHANGE  08/05/2016 [name], MD               [facility] 09/17/2016: IR GENERIC HISTORICAL     Comment:  IR CATHETER TUBE CHANGE 09/17/2016 [facility] 11/19/2016: IR GENERIC HISTORICAL     Comment:  IR CATHETER TUBE CHANGE 11/19/2016 [name], MD               [facility] 11/29/2016: IR GENERIC HISTORICAL     Comment:  IR CATHETER TUBE CHANGE 11/29/2016 [name], DO               [facility] No date: JOINT REPLACEMENT; Bilateral No date: TOTAL KNEE ARTHROPLASTY; Bilateral 1967: VAGINAL HYSTERECTOMY     Comment:  ovaries also removed because of fibroida BMI    Body Mass Index:  24.06 kg/m     Reproductive/Obstetrics negative OB ROS                             Anesthesia Physical  Anesthesia Plan  ASA: III  Anesthesia Plan: General   Post-op Pain Management:    Induction:   PONV Risk Score and Plan:   Airway Management Planned: Oral ETT  Additional Equipment:   Intra-op Plan:   Post-operative Plan: Extubation in OR  Informed Consent: I have reviewed the patients History and Physical, chart, labs and discussed the procedure including the risks, benefits and alternatives for the proposed anesthesia with the patient or authorized representative who has indicated his/her understanding and acceptance.   Dental Advisory Given  Plan Discussed with: CRNA  Anesthesia Plan Comments:  Anesthesia Quick Evaluation  

## 2018-07-24 NOTE — Progress Notes (Signed)
Prayer requested prior to surgery. Prayer offered. Assisted in calling local pastor ([employer] [place], [place], [place]) and sister Murray Hodgkins).

## 2018-07-24 NOTE — ED Notes (Signed)
Danielle Fiedler, NP at [facility] called for update on pt. Pt sd ok to give her information.

## 2018-07-24 NOTE — Consult Note (Signed)
ORTHOPAEDIC CONSULTATION  REQUESTING PHYSICIAN: Gladstone Lighter, MD  Chief Complaint:   Left hip pain.  History of Present Illness: [name] is a 84 y.o. female with multiple medical problems including hypertension, chronic kidney disease, coronary artery disease, and osteoporosis who is primarily wheelchair dependent and rarely assists with standing to transfer from bed to chair.  Apparently, her son usually lifts her and moves her from the bed to the wheelchair in the morning and then back again in the evening.  Apparently, the son was moving her this morning when she felt significant pain in her left hip.  She was brought to the emergency room where x-rays demonstrated a displaced left femoral neck fracture.  The patient denies any falls that may have precipitated this injury nor does she note any associated injury.  She did not strike her head or lose consciousness.  Past Medical History:  Diagnosis Date  . Anemia   . Chronic kidney disease, stage 3 ([place])   . Degenerative joint disease (DJD) of lumbar spine   . Diastolic dysfunction   . Dizziness   . Edema   . ETD (eustachian tube dysfunction)   . Heartburn   . History of rectal bleeding   . HTN (hypertension)   . Hypercalcemia   . Intracranial aneurysm   . Low vision, one eye   . Nephritis and nephropathy   . Osteoporosis   . Pyuria   . Urge incontinence   . Urinary frequency   . Urinary retention    Past Surgical History:  Procedure Laterality Date  . dilation of esophageal web    . ESOPHAGOGASTRODUODENOSCOPY (EGD) WITH PROPOFOL N/A 12/06/2017   Procedure: ESOPHAGOGASTRODUODENOSCOPY (EGD) WITH PROPOFOL;  Surgeon: Lucilla Lame, MD;  Location: [facility] ENDOSCOPY;  Service: Endoscopy;  Laterality: N/A;  . FRACTURE SURGERY Left    femur  . IR GENERIC HISTORICAL  08/05/2016   IR CATHETER TUBE CHANGE 08/05/2016 [name], MD [facility]  . IR GENERIC  HISTORICAL  09/17/2016   IR CATHETER TUBE CHANGE 09/17/2016 [facility]  . IR GENERIC HISTORICAL  11/19/2016   IR CATHETER TUBE CHANGE 11/19/2016 [name], MD [facility]  . IR GENERIC HISTORICAL  11/29/2016   IR CATHETER TUBE CHANGE 11/29/2016 [name], DO [facility]  . JOINT REPLACEMENT Bilateral   . TOTAL KNEE ARTHROPLASTY Bilateral   . VAGINAL HYSTERECTOMY  1967   ovaries also removed because of fibroida   Social History   Socioeconomic History  . Marital status: Widowed    Spouse name: Not on file  . Number of children: Not on file  . Years of education: Not on file  . Highest education level: Not on file  Occupational History  . Not on file  Social Needs  . Financial resource strain: Not on file  . Food insecurity:    Worry: Not on file    Inability: Not on file  . Transportation needs:    Medical: Not on file    Non-medical: Not on file  Tobacco Use  . Smoking status: Former Smoker    Last attempt to quit: 07/23/1964    Years since quitting: 54.0  . Smokeless tobacco: Never Used  . Tobacco comment: quit 1965  Substance and Sexual Activity  . Alcohol use: No    Alcohol/week: 0.0 standard drinks  . Drug use: No  . Sexual activity: Not on file  Lifestyle  . Physical activity:    Days per week: Not on file  Minutes per session: Not on file  . Stress: Not on file  Relationships  . Social connections:    Talks on phone: Not on file    Gets together: Not on file    Attends religious service: Not on file    Active member of club or organization: Not on file    Attends meetings of clubs or organizations: Not on file    Relationship status: Not on file  Other Topics Concern  . Not on file  Social History Narrative  . Not on file   Family History  Problem Relation Age of Onset  . Kidney disease Neg Hx   . Bladder Cancer Neg Hx   . Kidney cancer Neg Hx    No Known Allergies Prior to Admission medications   Medication Sig Start Date End Date  Taking? Authorizing Provider  acetaminophen (TYLENOL) 500 MG tablet Take 1,000 mg by mouth every 8 (eight) hours as needed for mild pain.    Yes [provider]  amLODipine (NORVASC) 10 MG tablet Take 10 mg by mouth daily.  12/13/16  Yes [provider]  atenolol (TENORMIN) 25 MG tablet Take 1 tablet (25 mg total) by mouth daily. 06/10/16  Yes Mody, Ulice Bold, MD  bisacodyl (DULCOLAX) 10 MG suppository Place 10 mg rectally daily as needed for moderate constipation.   Yes [provider]  bisacodyl (DULCOLAX) 5 MG EC tablet Take 5-10 mg by mouth daily as needed for moderate constipation.   Yes [provider]  cefTRIAXone (ROCEPHIN) IVPB See admin instructions. "Rocephin 1g IM mixed w/ lidocaine 1% 2cc daily for a total of 7 days"   Yes [provider]  cholecalciferol (VITAMIN D) 1000 units tablet Take 1,000 Units by mouth daily. 11/08/17  Yes [provider]  FEROSUL 325 (65 Fe) MG tablet Take 325 mg by mouth 2 (two) times daily. 11/08/17  Yes [provider]  furosemide (LASIX) 20 MG tablet Take 20 mg by mouth daily. 11/08/17  Yes [provider]  gabapentin (NEURONTIN) 300 MG capsule Take 300 mg by mouth 3 (three) times daily. 11/08/17  Yes [provider]  losartan (COZAAR) 100 MG tablet Take 1 tablet (100 mg total) by mouth daily. 06/10/16  Yes Bettey Costa, MD  Multiple Vitamin (MULTIVITAMIN) tablet Take 1 tablet by mouth daily. 06/10/16  Yes Mody, Ulice Bold, MD  oxybutynin (DITROPAN) 5 MG tablet Take 5 mg by mouth as directed. 30 minutes prior to monthly catheter replacemeent 12/04/16  Yes [provider]  pantoprazole (PROTONIX) 40 MG tablet Take 40 mg by mouth daily.   Yes [provider]  traMADol (ULTRAM) 50 MG tablet Take 1 tablet by mouth every 8 (eight) hours as needed for moderate pain.  02/23/18  Yes [provider]  feeding supplement, ENSURE ENLIVE, (ENSURE ENLIVE) LIQD Take 237 mLs by mouth 3  (three) times daily between meals. Patient not taking: Reported on 07/24/2018 03/30/17   Bettey Costa, MD   Dg Hip Unilat With Pelvis 2-3 Views Left  Result Date: 07/24/2018 CLINICAL DATA:  Fall. EXAM: DG HIP (WITH OR WITHOUT PELVIS) 2-3V LEFT COMPARISON:  None. FINDINGS: Acute transcervical fracture of the left femoral neck extending into the greater trochanter. Mild superior displacement with associated coxa vara deformity. Moderate to severe bilateral hip joint space narrowing with left protrusio acetabula. Osteopenia. Partially visualized left femoral diaphyseal hardware. IMPRESSION: 1. Acute, displaced transcervical fracture of the left femoral neck extending into the greater trochanter. Electronically Signed   By:  Titus Dubin M.D.   On: 07/24/2018 12:43    Positive ROS: All other systems have been reviewed and were otherwise negative with the exception of those mentioned in the HPI and as above.  Physical Exam: General:  Alert, no acute distress Psychiatric:  Patient is competent for consent with normal mood and affect   Cardiovascular:  No pedal edema Respiratory:  No wheezing, non-labored breathing GI:  Abdomen is soft and non-tender Skin:  No lesions in the area of chief complaint Neurologic:  Sensation intact distally Lymphatic:  No axillary or cervical lymphadenopathy  Orthopedic Exam:  Orthopedic examination is limited left hip and lower extremity.  The left lower extremity is held in a somewhat flexed, internally rotated and shortened position as compared to the right.  Skin inspection around the left hip is unremarkable.  There is no swelling, erythema, ecchymosis, abrasions, or other skin abnormalities identified.  She has mild tenderness palpation of the lateral aspect of the left hip.  She has more severe pain with any attempted active passive motion of the hip.  She is able to dorsiflex the toes and ankle of her left lower extremity.  Sensations intact light touch noted to  patient's of the left lower extremity.  She has good capillary refill to her left foot.  X-rays:  X-rays of the pelvis and left hip are available for review.  These films demonstrate a completely displaced left femoral neck fracture with significant degenerative joint disease of the left hip joint resulting in moderate hip protrusio.  There also is evidence of the superior-most portion of a supracondylar femoral plate extending into the subtrochanteric region which appears to be well-seated and without evidence of loosening.  Assessment: Displaced left femoral neck fracture with significant degenerative joint disease of left hip.  Plan: The treatment options were discussed with the patient, including both surgical and nonsurgical choices.  The patient would like to proceed with surgical intervention to include a left total hip arthroplasty with possible removal of the screws from the proximal-most portion of the retained surgical plate.  This procedure has been discussed in detail with the patient, as have the potential risks (including bleeding, infection, nerve and blood vessel injury, persistent recurrent pain, stiffness of the hip, loosening of any failure of the components, dislocation, leg length inequality, need for further surgery, blood clots, strokes, heart attacks and arrhythmias, etc.) and benefits.  The patient states her understanding and wishes to proceed.  A consent has been signed.  Thank you for asked me to participate in the care of this most pleasant yet unfortunate woman.  I will be happy to follow her with you.   Pascal Lux, MD  Beeper #:  [phone]  07/24/2018 4:47 PM

## 2018-07-24 NOTE — ED Triage Notes (Signed)
Pt has been having left hip pain since 10/4, no known injury. XRAY was completed at [facility] and pt has confirmed left hip non displaced fx. Pt reports pain 5/10. Pt is w/c bound. A/O x 4. Currently being treated for UTI with rocephin. No urinary complaints. Suprapubic cath in place and draining appropriately.

## 2018-07-24 NOTE — H&P (Signed)
[place] [facility] [place] NAME: [name]    MR#:  [id number]  DATE OF BIRTH:  [birth year]  DATE OF ADMISSION:  07/24/2018  PRIMARY CARE PHYSICIAN: Marisa Hua, MD   REQUESTING/REFERRING PHYSICIAN: Dr. Lavonia Drafts  CHIEF COMPLAINT:   Chief Complaint  Patient presents with  . Hip Pain    HISTORY OF PRESENT ILLNESS:  [name]  is a 84 y.o. female with a known history of hypertension, chronic urinary retention status post suprapubic catheter in place, GERD, diastolic dysfunction, CKD stage III, anemia, osteoporosis and degenerative joint disease presents to hospital from [facility] due to worsening left hip pain. Patient denies any fall.  She has osteopenia and osteoporosis.  For 4 days now she started complaining of left hip pain.  She is wheelchair-bound at baseline.  Denies any trauma.  No fevers or chills.  No nausea vomiting or diarrhea.  X-ray done today confirmed that she has a left hip nondisplaced fracture and so sent to the emergency room.  She has a chronic suprapubic catheter placed about 2 years ago and follows with urologist for the same.  Catheter is replaced every month.  Denies any urinary complaints but has cloudy urine that is infected on exam.  PAST MEDICAL HISTORY:   Past Medical History:  Diagnosis Date  . Anemia   . Chronic kidney disease, stage 3 ([place])   . Degenerative joint disease (DJD) of lumbar spine   . Diastolic dysfunction   . Dizziness   . Edema   . ETD (eustachian tube dysfunction)   . Heartburn   . History of rectal bleeding   . HTN (hypertension)   . Hypercalcemia   . Intracranial aneurysm   . Low vision, one eye   . Nephritis and nephropathy   . Osteoporosis   . Pyuria   . Urge incontinence   . Urinary frequency   . Urinary retention     PAST SURGICAL HISTORY:   Past Surgical History:  Procedure Laterality Date  . dilation of esophageal web    . ESOPHAGOGASTRODUODENOSCOPY (EGD)  WITH PROPOFOL N/A 12/06/2017   Procedure: ESOPHAGOGASTRODUODENOSCOPY (EGD) WITH PROPOFOL;  Surgeon: Lucilla Lame, MD;  Location: [facility] ENDOSCOPY;  Service: Endoscopy;  Laterality: N/A;  . FRACTURE SURGERY Left    femur  . IR GENERIC HISTORICAL  08/05/2016   IR CATHETER TUBE CHANGE 08/05/2016 [name], MD [facility]INTER[facility]  . IR GENERIC HISTORICAL  09/17/2016   IR CATHETER TUBE CHANGE 09/17/2016 [facility]  . IR GENERIC HISTORICAL  11/19/2016   IR CATHETER TUBE CHANGE 11/19/2016 [name], MD [facility]  . IR GENERIC HISTORICAL  11/29/2016   IR CATHETER TUBE CHANGE 11/29/2016 [name], DO [facility]  . JOINT REPLACEMENT Bilateral   . TOTAL KNEE ARTHROPLASTY Bilateral   . VAGINAL HYSTERECTOMY  1967   ovaries also removed because of fibroida    SOCIAL HISTORY:   Social History   Tobacco Use  . Smoking status: Former Smoker    Last attempt to quit: 07/23/1964    Years since quitting: 54.0  . Smokeless tobacco: Never Used  . Tobacco comment: quit 1965  Substance Use Topics  . Alcohol use: No    Alcohol/week: 0.0 standard drinks    FAMILY HISTORY:   Family History  Problem Relation Age of Onset  . Kidney disease Neg Hx   . Bladder Cancer Neg Hx   . Kidney cancer Neg Hx     DRUG ALLERGIES:  No Known Allergies  REVIEW OF SYSTEMS:   Review of Systems  Constitutional: Positive for malaise/fatigue. Negative for chills, fever and weight loss.  HENT: Positive for hearing loss. Negative for ear discharge, ear pain, nosebleeds and tinnitus.   Eyes: Negative for blurred vision, double vision and photophobia.  Respiratory: Negative for cough, hemoptysis, shortness of breath and wheezing.   Cardiovascular: Negative for chest pain, palpitations, orthopnea and leg swelling.  Gastrointestinal: Negative for abdominal pain, constipation, diarrhea, heartburn, melena, nausea and vomiting.  Genitourinary: Negative for dysuria, frequency, hematuria and urgency.    Musculoskeletal: Positive for back pain and myalgias. Negative for neck pain.  Skin: Negative for rash.  Neurological: Negative for dizziness, sensory change, speech change, focal weakness and headaches.  Endo/Heme/Allergies: Does not bruise/bleed easily.  Psychiatric/Behavioral: Negative for depression.    MEDICATIONS AT HOME:   Prior to Admission medications   Medication Sig Start Date End Date Taking? Authorizing Provider  acetaminophen (TYLENOL) 500 MG tablet Take 1,000 mg by mouth every 8 (eight) hours as needed for mild pain.    Yes [provider]  amLODipine (NORVASC) 10 MG tablet Take 10 mg by mouth daily.  12/13/16  Yes [provider]  atenolol (TENORMIN) 25 MG tablet Take 1 tablet (25 mg total) by mouth daily. 06/10/16  Yes Mody, Ulice Bold, MD  bisacodyl (DULCOLAX) 10 MG suppository Place 10 mg rectally daily as needed for moderate constipation.   Yes [provider]  bisacodyl (DULCOLAX) 5 MG EC tablet Take 5-10 mg by mouth daily as needed for moderate constipation.   Yes [provider]  cefTRIAXone (ROCEPHIN) IVPB See admin instructions. "Rocephin 1g IM mixed w/ lidocaine 1% 2cc daily for a total of 7 days"   Yes [provider]  cholecalciferol (VITAMIN D) 1000 units tablet Take 1,000 Units by mouth daily. 11/08/17  Yes [provider]  FEROSUL 325 (65 Fe) MG tablet Take 325 mg by mouth 2 (two) times daily. 11/08/17  Yes [provider]  furosemide (LASIX) 20 MG tablet Take 20 mg by mouth daily. 11/08/17  Yes [provider]  gabapentin (NEURONTIN) 300 MG capsule Take 300 mg by mouth 3 (three) times daily. 11/08/17  Yes [provider]  losartan (COZAAR) 100 MG tablet Take 1 tablet (100 mg total) by mouth daily. 06/10/16  Yes Bettey Costa, MD  Multiple Vitamin (MULTIVITAMIN) tablet Take 1 tablet by mouth daily. 06/10/16  Yes Mody, Ulice Bold, MD  oxybutynin (DITROPAN) 5 MG tablet Take 5 mg by mouth as directed. 30  minutes prior to monthly catheter replacemeent 12/04/16  Yes [provider]  pantoprazole (PROTONIX) 40 MG tablet Take 40 mg by mouth daily.   Yes [provider]  traMADol (ULTRAM) 50 MG tablet Take 1 tablet by mouth every 8 (eight) hours as needed for moderate pain.  02/23/18  Yes [provider]  feeding supplement, ENSURE ENLIVE, (ENSURE ENLIVE) LIQD Take 237 mLs by mouth 3 (three) times daily between meals. Patient not taking: Reported on 07/24/2018 03/30/17   Bettey Costa, MD      VITAL SIGNS:  Blood pressure (!) 103/54, pulse 83, temperature 98.9 F (37.2 C), temperature source Oral, resp. rate 18, weight 56 kg, SpO2 99 %.  PHYSICAL EXAMINATION:   Physical Exam  GENERAL:  84 y.o.-year-old patient lying in the bed with no acute distress.  EYES: Pupils equal, round, reactive to light and accommodation. No scleral icterus. Extraocular muscles intact.  HEENT: Head atraumatic, normocephalic. Oropharynx and nasopharynx clear.  NECK:  Supple, no jugular venous distention. No thyroid enlargement, no tenderness.  LUNGS: Normal breath sounds bilaterally, no wheezing, rales,rhonchi or crepitation. No use of accessory muscles of respiration. Decreased bibasilar breath sounds CARDIOVASCULAR: S1, S2 normal. No rubs, or gallops. 3/6 systolic murmur is present ABDOMEN: Soft, nontender, nondistended. Bowel sounds present. No organomegaly or mass.  Has a suprapubic catheter in place EXTREMITIES: No pedal edema, cyanosis, or clubbing.  Left leg is abducted and internally rotated NEUROLOGIC: Cranial nerves II through XII are intact. Muscle strength equal in all extremities except in left leg due to pain from the fracture. Sensation intact. Gait not checked.  PSYCHIATRIC: The patient is alert and oriented x 3.  SKIN: No obvious rash, lesion, or ulcer.   LABORATORY PANEL:   CBC Recent Labs  Lab 07/24/18 1133  WBC 7.5  HGB 9.0*  HCT 27.3*  PLT 312    ------------------------------------------------------------------------------------------------------------------  Chemistries  Recent Labs  Lab 07/24/18 1133  NA 136  K 3.8  CL 100  CO2 26  GLUCOSE 145*  BUN 31*  CREATININE 1.18*  CALCIUM 8.9   ------------------------------------------------------------------------------------------------------------------  Cardiac Enzymes No results for input(s): TROPONINI in the last 168 hours. ------------------------------------------------------------------------------------------------------------------  RADIOLOGY:  Dg Hip Unilat With Pelvis 2-3 Views Left  Result Date: 07/24/2018 CLINICAL DATA:  Fall. EXAM: DG HIP (WITH OR WITHOUT PELVIS) 2-3V LEFT COMPARISON:  None. FINDINGS: Acute transcervical fracture of the left femoral neck extending into the greater trochanter. Mild superior displacement with associated coxa vara deformity. Moderate to severe bilateral hip joint space narrowing with left protrusio acetabula. Osteopenia. Partially visualized left femoral diaphyseal hardware. IMPRESSION: 1. Acute, displaced transcervical fracture of the left femoral neck extending into the greater trochanter. Electronically Signed   By: Titus Dubin M.D.   On: 07/24/2018 12:43    EKG:   Orders placed or performed during the hospital encounter of 07/24/18  . ED EKG  . ED EKG  . EKG 12-Lead  . EKG 12-Lead    IMPRESSION AND PLAN:   [name]  is a 84 y.o. female with a known history of hypertension, chronic urinary retention status post suprapubic catheter in place, GERD, diastolic dysfunction, CKD stage III, anemia, osteoporosis and degenerative joint disease presents to hospital from [facility] due to worsening left hip pain.  1.  Acute displaced left femoral neck fracture extending onto the greater trochanter-no trauma noted.  Secondary to osteopenia and osteoporosis. -Orthopedics consult.  Likely going for ORIF today. -Monitor postop  hemoglobin.  DVT prophylaxis and physical therapy will be started after surgery -Pain management -Likely rehab  2.  Acute cystitis-sent for urine cultures.  Started on antibiotics.  Known history of prior C. difficile, so if cultures are negative-we will discontinue antibiotics. -Has chronic suprapubic catheter that he is being changed every monthly.  3.  Neuropathy-continue gabapentin  4.  Hypertension-on losartan, Norvasc and atenolol  5.  DVT prophylaxis-will be started after surgery    All the records are reviewed and case discussed with ED provider. Management plans discussed with the patient, family and they are in agreement.  CODE STATUS: DNR  TOTAL TIME TAKING CARE OF THIS PATIENT: 55 minutes.    Anand Tejada M.D on 07/24/2018 at 2:05 PM  Between 7am to 6pm - Pager - [phone]  After 6pm go to [email or web address] - password [email or web address] [facility]  Office  [phone]  CC: Primary care physician; Marisa Hua, MD

## 2018-07-24 NOTE — Transfer of Care (Signed)
Immediate Anesthesia Transfer of Care Note  Patient: [name]  Procedure(s) Performed: TOTAL HIP ARTHROPLASTY WITH HARDWARE REMOVAL (Left Hip)  Patient Location: PACU  Anesthesia Type:General  Level of Consciousness: awake, alert  and patient cooperative  Airway & Oxygen Therapy: Patient Spontanous Breathing and Patient connected to face mask oxygen  Post-op Assessment: Report given to RN and Post -op Vital signs reviewed and stable  Post vital signs: Reviewed and stable  Last Vitals:  Vitals Value Taken Time  BP    Temp    Pulse    Resp    SpO2      Last Pain:  Vitals:   07/24/18 1613  TempSrc:   PainSc: 4          Complications: No apparent anesthesia complications

## 2018-07-24 NOTE — Clinical Social Work Note (Signed)
Clinical Social Work Assessment  Patient Details  Name: [name] MRN: [id number] Date of Birth: June 12, 1931  Date of referral:  07/24/18               Reason for consult:  Other (Comment Required)(From [facility] )                Permission sought to share information with:  Facility Art therapist granted to share information::  Yes, Verbal Permission Granted  Name::      [place]::     Relationship::     Contact Information:     Housing/Transportation Living arrangements for the past 2 months:  [place] of Information:  Patient, Facility Patient Interpreter Needed:  None Criminal Activity/Legal Involvement Pertinent to Current Situation/Hospitalization:  No - Comment as needed Significant Relationships:  Siblings Lives with:  Facility Resident Do you feel safe going back to the place where you live?  Yes Need for family participation in patient care:  Yes (Comment)  Care giving concerns:  Patient is a long term care SNF resident at [facility].    Social Worker assessment / plan:  Holiday representative ([place]) reviewed chart and noted that patient is from [place]. Per [facility] admissions coordinator at [facility] patient is a long term care resident under medicaid, her room is D-3. Per Liliane Channel patient can return to [facility] when medically stable. CSW met with patient today and made her aware of above. Patient is agreeable to D/C to [facility] and stated that she has been there for 6 months now. FL2 complete. CSW will continue to follow and assist as needed.   Employment status:  Retired, Disabled (Comment on whether or not currently receiving Disability) Insurance information:  Medicaid In [place], [employer] PT Recommendations:  Not assessed at this time Information / Referral to community resources:  [place]  Patient/Family's Response to care:  Patient is agreeable to D/C back to [facility].    Patient/Family's Understanding of and Emotional Response to Diagnosis, Current Treatment, and Prognosis:  Patient was very pleasant and thanked CSW for assistance.   Emotional Assessment Appearance:  Appears stated age Attitude/Demeanor/Rapport:    Affect (typically observed):  Accepting, Adaptable, Pleasant Orientation:  Oriented to Self, Oriented to Place, Oriented to  Time, Oriented to Situation Alcohol / Substance use:  Not Applicable Psych involvement (Current and /or in the community):  No (Comment)  Discharge Needs  Concerns to be addressed:  Discharge Planning Concerns Readmission within the last 30 days:  No Current discharge risk:  Dependent with Mobility, Chronically ill Barriers to Discharge:  Continued Medical Work up   [employer], Veronia Beets, LCSW 07/24/2018, 4:15 PM

## 2018-07-24 NOTE — ED Notes (Signed)
Left message for pts sister

## 2018-07-25 LAB — TYPE AND SCREEN
ABO/RH(D): O POS
ANTIBODY SCREEN: NEGATIVE
UNIT DIVISION: 0
Unit division: 0

## 2018-07-25 LAB — BPAM RBC
BLOOD PRODUCT EXPIRATION DATE: 201910302359
Blood Product Expiration Date: 201910302359
ISSUE DATE / TIME: 201910072109
ISSUE DATE / TIME: 201910072237
UNIT TYPE AND RH: 5100
Unit Type and Rh: 5100

## 2018-07-25 LAB — URINE CULTURE

## 2018-07-25 LAB — CBC
HEMATOCRIT: 27.7 % — AB (ref 35.0–47.0)
Hemoglobin: 9.2 g/dL — ABNORMAL LOW (ref 12.0–16.0)
MCH: 29.7 pg (ref 26.0–34.0)
MCHC: 33.3 g/dL (ref 32.0–36.0)
MCV: 89.1 fL (ref 80.0–100.0)
PLATELETS: 214 10*3/uL (ref 150–440)
RBC: 3.11 MIL/uL — ABNORMAL LOW (ref 3.80–5.20)
RDW: 16.2 % — AB (ref 11.5–14.5)
WBC: 8.6 10*3/uL (ref 3.6–11.0)

## 2018-07-25 MED ORDER — CEPHALEXIN 500 MG PO CAPS: 500.0000 mg | ORAL_CAPSULE | Freq: Two times a day (BID) | ORAL | 0 refills | Status: DC

## 2018-07-25 MED ORDER — ENOXAPARIN SODIUM 30 MG/0.3ML ~~LOC~~ SOLN: 30.0000 mg | SUBCUTANEOUS | 0 refills | Status: DC

## 2018-07-25 MED ORDER — LOSARTAN POTASSIUM 50 MG PO TABS: 50.0000 mg | ORAL_TABLET | Freq: Every day | ORAL | 0 refills | Status: DC

## 2018-07-25 MED ORDER — COLLAGENASE 250 UNIT/GM EX OINT
TOPICAL_OINTMENT | Freq: Every day | CUTANEOUS | Status: DC
  Administered 2018-07-25: 14:00:00 via TOPICAL
  Filled 2018-07-25: qty 30

## 2018-07-25 MED ORDER — CEPHALEXIN 500 MG PO CAPS: 500.0000 mg | ORAL_CAPSULE | Freq: Two times a day (BID) | ORAL | Status: DC

## 2018-07-25 MED ORDER — CEPHALEXIN 500 MG PO CAPS
500.0000 mg | ORAL_CAPSULE | Freq: Two times a day (BID) | ORAL | Status: DC
  Administered 2018-07-25: 500 mg via ORAL
  Filled 2018-07-25: qty 1

## 2018-07-25 MED ORDER — OXYCODONE HCL 5 MG PO TABS: 5.0000 mg | ORAL_TABLET | Freq: Four times a day (QID) | ORAL | 0 refills | Status: DC | PRN

## 2018-07-25 MED ORDER — TRAMADOL HCL 50 MG PO TABS: 50.0000 mg | ORAL_TABLET | Freq: Three times a day (TID) | ORAL | 0 refills | Status: DC | PRN

## 2018-07-25 NOTE — Discharge Summary (Addendum)
[place] [facility] [place] NAME: [name]    MR#:  [id number]  DATE OF BIRTH:  [birth year]  DATE OF ADMISSION:  07/24/2018 ADMITTING PHYSICIAN: Gladstone Lighter, MD  DATE OF DISCHARGE: 07/25/2018  PRIMARY CARE PHYSICIAN: Marisa Hua, MD    ADMISSION DIAGNOSIS:  Lower urinary tract infectious disease [N39.0] Closed fracture of left hip, initial encounter ([place]) [S72.002A]  DISCHARGE DIAGNOSIS:  close fracture of the left hip status post surgery   SECONDARY DIAGNOSIS:   Past Medical History:  Diagnosis Date  . Anemia   . Chronic kidney disease, stage 3 ([place])   . Degenerative joint disease (DJD) of lumbar spine   . Diastolic dysfunction   . Dizziness   . Edema   . ETD (eustachian tube dysfunction)   . Heartburn   . History of rectal bleeding   . HTN (hypertension)   . Hypercalcemia   . Intracranial aneurysm   . Low vision, one eye   . Nephritis and nephropathy   . Osteoporosis   . Pyuria   . Urge incontinence   . Urinary frequency   . Urinary retention     HOSPITAL COURSE:  [name]  is a 84 y.o. female with a known history of hypertension, chronic urinary retention status post suprapubic catheter in place, GERD, diastolic dysfunction, CKD stage III, anemia, osteoporosis and degenerative joint disease presents to hospital from [facility] due to worsening left hip pain.  1.  Acute displaced left femoral neck fracture extending onto the greater trochanter-no trauma noted.  Secondary to osteopenia and osteoporosis. -Orthopedics consult with Dr. Loyal Gambler. Patient is postop day one hip surgery. She is doing well. Hemodynamically stable. Hemoglobin 9.2. She got two units of blood transfusion.  -Monitor postop hemoglobin.   -DVT prophylaxis  started after surgery. Patient is bedbound wheelchair-bound. Will not benefit much from physical therapy. She is a long-term resident at [facility] -Pain management -Dr. Loyal Gambler and  from his standpoint okay to discharge.  2.  Acute cystitis-likely due to suprapubic catheter chronic sent for urine cultures.  Started on antibiotics.  Known history of prior C. difficile, so if cultures are negative-we will discontinue antibiotics. -Since patient just had a surgery with hardware I am going to give her seven day course of treatment with Keflex. -Has chronic suprapubic catheter that he is being changed every monthly.  3.  Neuropathy-continue gabapentin  4.  Hypertension-on losartan, Norvasc and atenolol  5.  DVT prophylaxis-Lovenox subcu for 14 days per Dr. Roland Rack  6. Chronic bilateral heel pressure ulcers. Seen by wound consult. Follow recommendation. Float heels to reduce pressure.  Santyl to provide enzymatic debridement of nonviable tissue to bilat wounds.  No family present to discuss plan of care.  D/c to [facility] today  CONSULTS OBTAINED:  Treatment Team:  Corky Mull, MD  DRUG ALLERGIES:  No Known Allergies  DISCHARGE MEDICATIONS:   Allergies as of 07/25/2018   No Known Allergies     Medication List    STOP taking these medications   cefTRIAXone  IVPB Commonly known as:  ROCEPHIN   feeding supplement (ENSURE ENLIVE) Liqd     TAKE these medications   acetaminophen 500 MG tablet Commonly known as:  TYLENOL Take 1,000 mg by mouth every 8 (eight) hours as needed for mild pain.   amLODipine 10 MG tablet Commonly known as:  NORVASC Take 10 mg by mouth daily.   atenolol 25 MG tablet Commonly known as:  TENORMIN Take 1 tablet (25 mg total) by mouth daily.   bisacodyl 10 MG suppository Commonly known as:  DULCOLAX Place 10 mg rectally daily as needed for moderate constipation.   bisacodyl 5 MG EC tablet Commonly known as:  DULCOLAX Take 5-10 mg by mouth daily as needed for moderate constipation.   cephALEXin 500 MG capsule Commonly known as:  KEFLEX Take 1 capsule (500 mg total) by mouth every 12 (twelve) hours.   cholecalciferol 1000  units tablet Commonly known as:  VITAMIN D Take 1,000 Units by mouth daily.   enoxaparin 30 MG/0.3ML injection Commonly known as:  LOVENOX Inject 0.3 mLs (30 mg total) into the skin daily. Start taking on:  07/26/2018   FEROSUL 325 (65 FE) MG tablet Generic drug:  ferrous sulfate Take 325 mg by mouth 2 (two) times daily.   furosemide 20 MG tablet Commonly known as:  LASIX Take 20 mg by mouth daily.   gabapentin 300 MG capsule Commonly known as:  NEURONTIN Take 300 mg by mouth 3 (three) times daily.   losartan 50 MG tablet Commonly known as:  COZAAR Take 1 tablet (50 mg total) by mouth daily. Hold if SBP <110 What changed:    medication strength  how much to take  additional instructions   multivitamin tablet Take 1 tablet by mouth daily.   oxybutynin 5 MG tablet Commonly known as:  DITROPAN Take 5 mg by mouth as directed. 30 minutes prior to monthly catheter replacemeent   oxyCODONE 5 MG immediate release tablet Commonly known as:  Oxy IR/ROXICODONE Take 1 tablet (5 mg total) by mouth every 6 (six) hours as needed for moderate pain (pain score 4-6).   pantoprazole 40 MG tablet Commonly known as:  PROTONIX Take 40 mg by mouth daily.   traMADol 50 MG tablet Commonly known as:  ULTRAM Take 1 tablet (50 mg total) by mouth every 8 (eight) hours as needed for moderate pain.       If you experience worsening of your admission symptoms, develop shortness of breath, life threatening emergency, suicidal or homicidal thoughts you must seek medical attention immediately by calling 911 or calling your MD immediately  if symptoms less severe.  You Must read complete instructions/literature along with all the possible adverse reactions/side effects for all the Medicines you take and that have been prescribed to you. Take any new Medicines after you have completely understood and accept all the possible adverse reactions/side effects.   Please note  You were cared for by a  hospitalist during your hospital stay. If you have any questions about your discharge medications or the care you received while you were in the hospital after you are discharged, you can call the unit and asked to speak with the hospitalist on call if the hospitalist that took care of you is not available. Once you are discharged, your primary care physician will handle any further medical issues. Please note that NO REFILLS for any discharge medications will be authorized once you are discharged, as it is imperative that you return to your primary care physician (or establish a relationship with a primary care physician if you do not have one) for your aftercare needs so that they can reassess your need for medications and monitor your lab values. Today   SUBJECTIVE   She denies any complaints. Minimal pain on the surgical hip. No family in the room.  VITAL SIGNS:  Blood pressure (!) 121/59, pulse 84, temperature 99.1 F (37.3 C), temperature  source Axillary, resp. rate 18, height 5\' 3"  (1.6 m), weight 56 kg, SpO2 100 %.  I/O:    Intake/Output Summary (Last 24 hours) at 07/25/2018 1206 Last data filed at 07/25/2018 0952 Gross per 24 hour  Intake 2886.41 ml  Output 1675 ml  Net 1211.41 ml    PHYSICAL EXAMINATION:  GENERAL:  84 y.o.-year-old patient lying in the bed with no acute distress.  EYES: Pupils equal, round, reactive to light and accommodation. No scleral icterus. Extraocular muscles intact.  HEENT: Head atraumatic, normocephalic. Oropharynx and nasopharynx clear.  NECK:  Supple, no jugular venous distention. No thyroid enlargement, no tenderness.  LUNGS: Normal breath sounds bilaterally, no wheezing, rales,rhonchi or crepitation. No use of accessory muscles of respiration.  CARDIOVASCULAR: S1, S2 normal. No murmurs, rubs, or gallops.  ABDOMEN: Soft, non-tender, non-distended. Bowel sounds present. No organomegaly or mass. suprapubic catheter present EXTREMITIES: No pedal edema,  cyanosis, or clubbing.  NEUROLOGIC: Cranial nerves II through XII are intact. Muscle strength 5/5 in all extremities. Sensation intact. Gait not checked.  PSYCHIATRIC: The patient is alert and oriented x 3.  SKIN: chronic bilateral heel pressure sores. Dressing present.  DATA REVIEW:   CBC  Recent Labs  Lab 07/25/18 0554  WBC 8.6  HGB 9.2*  HCT 27.7*  PLT 214    Chemistries  Recent Labs  Lab 07/24/18 1133  NA 136  K 3.8  CL 100  CO2 26  GLUCOSE 145*  BUN 31*  CREATININE 1.18*  CALCIUM 8.9    Microbiology Results   Recent Results (from the past 240 hour(s))  Surgical PCR screen     Status: None   Collection Time: 07/24/18  4:11 PM  Result Value Ref Range Status   MRSA, PCR NEGATIVE NEGATIVE Final   Staphylococcus aureus NEGATIVE NEGATIVE Final    Comment: (NOTE) The Xpert SA Assay (FDA approved for NASAL specimens in patients 4 years of age and older), is one component of a comprehensive surveillance program. It is not intended to diagnose infection nor to guide or monitor treatment. Performed at [facility], [address] [place], [place] [address]     RADIOLOGY:  Dg Pelvis Portable  Result Date: 07/24/2018 CLINICAL DATA:  Left hip hemiarthroplasty EXAM: PORTABLE PELVIS 1-2 VIEWS COMPARISON:  07/24/2018 FINDINGS: New uncemented left press-fit arthroplasties identified with shallow appearance of the acetabular component necessitated by marked protrusio deformity of the acetabulum. Lateral plate fixation hardware is partially included along the lateral aspect of the left femoral diaphysis. No immediate postoperative complications are identified. Pre-existing greater trochanteric fracture is identified on the left as seen on the preoperative views. Lesser degree of protrusio deformity of the right hip with marked joint space narrowing and spurring about the right hip and femoral head. IMPRESSION: New uncemented left hip arthroplasty. Shallow appearance  of the left acetabular component necessitated by pre-existing protrusio deformity of the acetabulum. Lesser degree of native right hip protrusio deformity with marked joint space narrowing and spurring. Electronically Signed   By: Ashley Royalty M.D.   On: 07/24/2018 23:36   Dg Hip Unilat With Pelvis 2-3 Views Left  Result Date: 07/24/2018 CLINICAL DATA:  Fall. EXAM: DG HIP (WITH OR WITHOUT PELVIS) 2-3V LEFT COMPARISON:  None. FINDINGS: Acute transcervical fracture of the left femoral neck extending into the greater trochanter. Mild superior displacement with associated coxa vara deformity. Moderate to severe bilateral hip joint space narrowing with left protrusio acetabula. Osteopenia. Partially visualized left femoral diaphyseal hardware. IMPRESSION: 1. Acute, displaced  transcervical fracture of the left femoral neck extending into the greater trochanter. Electronically Signed   By: Titus Dubin M.D.   On: 07/24/2018 12:43     Management plans discussed with the patient, family and they are in agreement.  CODE STATUS:     Code Status Orders  (From admission, onward)         Start     Ordered   07/24/18 1500  Do not attempt resuscitation (DNR)  Continuous    Question Answer Comment  In the event of cardiac or respiratory ARREST Do not call a "code blue"   In the event of cardiac or respiratory ARREST Do not perform Intubation, CPR, defibrillation or ACLS   In the event of cardiac or respiratory ARREST Use medication by any route, position, wound care, and other measures to relive pain and suffering. May use oxygen, suction and manual treatment of airway obstruction as needed for comfort.      07/24/18 1502        Code Status History    Date Active Date Inactive Code Status Order ID Comments User Context   12/24/2017 1556 12/29/2017 2033 Full Code [id number]  Dustin Flock, MD Inpatient   12/24/2017 1417 12/24/2017 1556 DNR [id number]  Dustin Flock, MD ED   12/06/2017 1406 12/06/2017 2006  DNR [id number]  Fritzi Mandes, MD Inpatient   12/03/2017 1506 12/06/2017 1406 Full Code [id number]  Idelle Crouch, MD ED   03/28/2017 1649 03/30/2017 2157 DNR [id number]  Hillary Bow, MD ED   06/08/2016 2040 06/11/2016 2141 Full Code [id number]  Hower, Aaron Mose, MD ED    Advance Directive Documentation     Most Recent Value  Type of Advance Directive  Healthcare Power of Attorney, Living will  Pre-existing out of facility DNR order (yellow form or pink MOST form)  -  "MOST" Form in Place?  -      TOTAL TIME TAKING CARE OF THIS PATIENT: *40* minutes.    Fritzi Mandes M.D on 07/25/2018 at 12:06 PM  Between 7am to 6pm - Pager - [phone] After 6pm go to [email or web address] - password [email or web address] [place]  Office  [phone]  CC: Primary care physician; Marisa Hua, MD

## 2018-07-25 NOTE — Progress Notes (Addendum)
Patient is medically stable for D/C back to [facility] today. Per [facility] admissions coordinator at [facility] patient can return today to room D-3. RN will call report and arrange EMS for transport. Clinical Education officer, museum (CSW) sent D/C orders to [employer] [facility] via [employer]. Patient is aware of above. CSW left patient's sister [name] a Advertising account executive. Please reconsult if future social work needs arise. CSW signing off.   Patient's sister [name] called CSW back and was made aware of above.   [employer], LCSW [phone]

## 2018-07-25 NOTE — Progress Notes (Addendum)
Pt ready for discharge to SNF per MD. Pt is more forgetful than yesterday, Dr. Posey Pronto notified-ok to d/c. Report called to nurse at [facility]; all questions answered and discharge instructions reviewed. EMS transportation set up for pt by RN. PIV removed, VSS. Pt belongings packed, sister [name] updated.   Ethelda Chick

## 2018-07-25 NOTE — Consult Note (Signed)
[place] Nurse wound consult note Reason for Consult: Consult requested for bilat heels.  Pt is familiar to [facility] team from previous admissions and states she is followed by a physician for debridements. Wound type: Left heel stage 4; bone palpable with swab.  90% red and moist, 10% slough/eschar.  4.5X3X.3cm, small amt yellow drainage, no odor Right heel with unstageable pressure injury; 5X3X.3cm, 50% eschar, 50% red, mod amt green drainage, some odor. Pressure Injury POA: Yes Dressing procedure/placement/frequency: Float heels to reduce pressure.  Santyl to provide enzymatic debridement of nonviable tissue to bilat wounds.  No family present to discuss plan of care. Please re-consult if further assistance is needed.  Thank-you,  Julien Girt MSN, [place], [place], [place], [place]

## 2018-07-25 NOTE — Discharge Instructions (Signed)
Heel protectors bilateral lower extremity with dressing changes on bilateral chronic lower extremity foot ulcers per nursing home protocol

## 2018-07-25 NOTE — Progress Notes (Addendum)
Subjective: 1 Day Post-Op Procedure(s) (LRB): TOTAL HIP ARTHROPLASTY WITH HARDWARE REMOVAL-2 screws (Left) Patient reports pain as mild.   Reports that her left hip pain is much improved this morning. Patient is well, and has had no acute complaints or problems Plan is to go Home after hospital stay. Prior to surgery the patient was non-ambulatory. Negative for chest pain and shortness of breath Fever: 99 Temp this AM. Gastrointestinal:Negative for nausea and vomiting  Objective: Vital signs in last 24 hours: Temp:  [96.1 F (35.6 C)-99 F (37.2 C)] 99 F (37.2 C) (10/08 0803) Pulse Rate:  [71-93] 93 (10/08 0803) Resp:  [10-22] 20 (10/08 0445) BP: (102-135)/(52-81) 122/59 (10/08 0803) SpO2:  [98 %-100 %] 100 % (10/08 0803) Weight:  [56 kg] 56 kg (10/07 1056)  Intake/Output from previous day:  Intake/Output Summary (Last 24 hours) at 07/25/2018 1053 Last data filed at 07/25/2018 0952 Gross per 24 hour  Intake 2886.41 ml  Output 1675 ml  Net 1211.41 ml    Intake/Output this shift: Total I/O In: -  Out: 450 [Urine:450]  Labs: Recent Labs    07/24/18 1133 07/25/18 0554  HGB 9.0* 9.2*   Recent Labs    07/24/18 1133 07/25/18 0554  WBC 7.5 8.6  RBC 3.05* 3.11*  HCT 27.3* 27.7*  PLT 312 214   Recent Labs    07/24/18 1133  NA 136  K 3.8  CL 100  CO2 26  BUN 31*  CREATININE 1.18*  GLUCOSE 145*  CALCIUM 8.9   Recent Labs    07/24/18 1133  INR 1.12     EXAM General - Patient is Alert and Appropriate Extremity - ABD soft Sensation intact distally Intact pulses distally Incision: dressing C/D/I No cellulitis present Compartment soft Dressing/Incision - clean, dry, no drainage Motor Function - intact, moving foot and toes well on exam.  Abdomen soft with normal BS.  Past Medical History:  Diagnosis Date  . Anemia   . Chronic kidney disease, stage 3 ([place])   . Degenerative joint disease (DJD) of lumbar spine   . Diastolic dysfunction   .  Dizziness   . Edema   . ETD (eustachian tube dysfunction)   . Heartburn   . History of rectal bleeding   . HTN (hypertension)   . Hypercalcemia   . Intracranial aneurysm   . Low vision, one eye   . Nephritis and nephropathy   . Osteoporosis   . Pyuria   . Urge incontinence   . Urinary frequency   . Urinary retention     Assessment/Plan: 1 Day Post-Op Procedure(s) (LRB): TOTAL HIP ARTHROPLASTY WITH HARDWARE REMOVAL-2 screws (Left) Active Problems:   Closed left hip fracture (HCC)  Estimated body mass index is 21.87 kg/m as calculated from the following:   Height as of 04/02/18: 5\' 3"  (1.6 m).   Weight as of this encounter: 56 kg. Advance diet   Can attempt to work with therapy however the patient was non-ambulatory prior to surgery, will continue to be non-ambulatory. Pt did receive PRBC during surgery, Hg 9.2 this AM. Can possibly be discharged today pending pain levels. Up with therapy, can see what the patient can tolerate.  Upon discharge continue Lovenox 40mg  daily for 14 days. Follow-up with [place] in 14 days for staple removal.  DVT Prophylaxis - Lovenox, Foot Pumps and TED hose Non-weightbearing to the left lower extremity due to prior status.  Raquel Chasty Randal, PA-C [facility] 07/25/2018, 10:53 AM

## 2018-07-25 NOTE — Evaluation (Signed)
Physical Therapy Evaluation Patient Details Name: [name] MRN: [id number] DOB: [birth year] Today's Date: 07/25/2018   History of Present Illness  Pt is an 84 y.o female presenting s/p L THA after findings showed L hip fracture. Prior to admission pt was living in a SNF and wheelchair bound. PMH significant for hypertension, chronic urinary retention status post suprapubic catheter in place, GERD, diastolic dysfunction, CKD stage III and anemia.   Clinical Impression  Upon arrival pt awake and finishing with wound care for heel bed sores. Pt pleasant upon arrival however only oriented to self. Pt with unclear history and unable to determine PLOF based on pt report. Pt resisting PROM exercises and becoming agitated when her L hip is mobilized. Spent significant time on education of importance of early exercise intervention following posterior THA, however pt still unwilling to perform active exercise. Pt is not ambulatory at baseline and currently is unable to perform any bed mobility/transfers/ambulation. Pt is most appropriate to return to her previous level of care at SNF to improve upon functional limitations listed above.       Follow Up Recommendations SNF    Equipment Recommendations       Recommendations for Other Services       Precautions / Restrictions Precautions Precautions: Posterior Hip;Fall Precaution Booklet Issued: No Precaution Comments: this afternoon Restrictions Weight Bearing Restrictions: Yes LLE Weight Bearing: Weight bearing as tolerated      Mobility  Bed Mobility               General bed mobility comments: not tested due to pt refusing to participate   Transfers                 General transfer comment: not tested due to pt refusing to participate   Ambulation/Gait             General Gait Details: not tested pt is wheelchair bound at baseline  Stairs            Wheelchair Mobility    Modified Rankin (Stroke Patients  Only)       Balance Overall balance assessment: (supine activity only this date)                                           Pertinent Vitals/Pain Pain Assessment: 0-10 Pain Score: 0-No pain(no pain at rest however grimacing with passive movement of L hip) Pain Location: L hip Pain Descriptors / Indicators: Aching Pain Intervention(s): Monitored during session    Home Living Family/patient expects to be discharged to:: Skilled nursing facility                      Prior Function Level of Independence: Needs assistance   Gait / Transfers Assistance Needed: pt uses a wheelchair and unable to assess if she does any walking, however pt with B heel sores.      Comments: Minimal history provided due to pt confusion and poor historian.      Hand Dominance        Extremity/Trunk Assessment   Upper Extremity Assessment Upper Extremity Assessment: Difficult to assess due to impaired cognition(grossly 3/5 UE however not giving any effort during resistance )    Lower Extremity Assessment Lower Extremity Assessment: Difficult to assess due to impaired cognition(pt refusing to move B LE, performed 1 SLR on R)  Communication   Communication: No difficulties  Cognition Arousal/Alertness: Awake/alert Behavior During Therapy: Agitated(reluctant to work with therapy) Overall Cognitive Status: Difficult to assess Area of Impairment: Orientation;Attention;Following commands;Safety/judgement                 Orientation Level: Disoriented to;Place;Time;Situation Current Attention Level: Selective   Following Commands: Follows one step commands inconsistently Safety/Judgement: Decreased awareness of deficits     General Comments: Pt unable to determine why she is in the hospital and is confused about her PLOF. Pt getting agitated during attempts at PROM and is unwilling to provide any effort because she says "i could move if i wanted to but i dont  want to."       General Comments      Exercises Total Joint Exercises Ankle Circles/Pumps: PROM;Both;10 reps;Supine(pt unwilling to perform active DF/PF) Quad Sets: PROM;Left;10 reps;Supine(pt providing no quad contraction) Hip ABduction/ADduction: PROM;Left;5 reps(pt resisting hip abd and does not want to perfrom passively or actively)   Assessment/Plan    PT Assessment Patient needs continued PT services  PT Problem List Decreased strength;Decreased cognition;Decreased knowledge of use of DME;Decreased range of motion;Decreased activity tolerance;Decreased safety awareness;Decreased knowledge of precautions;Decreased balance;Decreased skin integrity;Decreased mobility       PT Treatment Interventions DME instruction;Balance training;Gait training;Functional mobility training;Therapeutic activities;Therapeutic exercise;Patient/family education;Modalities    PT Goals (Current goals can be found in the Care Plan section)  Acute Rehab PT Goals Patient Stated Goal: to go home PT Goal Formulation: With patient Time For Goal Achievement: 08/08/18 Potential to Achieve Goals: Poor    Frequency BID   Barriers to discharge        Co-evaluation               AM-PAC PT "6 Clicks" Daily Activity  Outcome Measure Difficulty turning over in bed (including adjusting bedclothes, sheets and blankets)?: Unable Difficulty moving from lying on back to sitting on the side of the bed? : Unable Difficulty sitting down on and standing up from a chair with arms (e.g., wheelchair, bedside commode, etc,.)?: Unable Help needed moving to and from a bed to chair (including a wheelchair)?: Total Help needed walking in hospital room?: Total Help needed climbing 3-5 steps with a railing? : Total 6 Click Score: 6    End of Session   Activity Tolerance: Other (comment)(PROM and AROM attempted however pt unwilling to participate) Patient left: in bed;with call bell/phone within reach;with bed  alarm set;with SCD's reapplied Nurse Communication: Mobility status PT Visit Diagnosis: Unsteadiness on feet (R26.81);Muscle weakness (generalized) (M62.81);Difficulty in walking, not elsewhere classified (R26.2);Other abnormalities of gait and mobility (R26.89);Pain Pain - Right/Left: Left Pain - part of body: Hip    Time: 1130-1205 PT Time Calculation (min) (ACUTE ONLY): 35 min   Charges:              Ernie Avena, SPT 07/25/2018, 1:29 PM

## 2018-07-26 LAB — SURGICAL PATHOLOGY

## 2018-07-26 NOTE — Anesthesia Postprocedure Evaluation (Signed)
Anesthesia Post Note  Patient: [name]  Procedure(s) Performed: TOTAL HIP ARTHROPLASTY WITH HARDWARE REMOVAL-2 screws (Left Hip)  Patient location during evaluation: PACU Anesthesia Type: General Level of consciousness: awake and alert and oriented Pain management: pain level controlled Vital Signs Assessment: post-procedure vital signs reviewed and stable Respiratory status: spontaneous breathing Cardiovascular status: blood pressure returned to baseline Anesthetic complications: no     Last Vitals:  Vitals:   07/25/18 1113 07/25/18 1500  BP: (!) 121/59 (!) 130/92  Pulse: 84 96  Resp: 18 19  Temp: 37.3 C 37 C  SpO2: 100% 100%    Last Pain:  Vitals:   07/25/18 1500  TempSrc: Oral  PainSc:                  [name]

## 2018-07-27 ENCOUNTER — Encounter: Payer: Self-pay | Admitting: Surgery

## 2018-08-01 ENCOUNTER — Ambulatory Visit
Admission: RE | Admit: 2018-08-01 | Discharge: 2018-08-01 | Disposition: A | Discharge status: Home / Self Care | Payer: Medicare Other | Source: Ambulatory Visit | Attending: Nurse Practitioner | Admitting: Nurse Practitioner

## 2018-08-01 DIAGNOSIS — D649 Anemia, unspecified: Secondary | ICD-10-CM | POA: Diagnosis present

## 2018-08-01 LAB — PREPARE RBC (CROSSMATCH)

## 2018-08-01 MED ORDER — SODIUM CHLORIDE 0.9 % IV SOLN: INTRAVENOUS | Status: DC

## 2018-08-01 NOTE — Progress Notes (Signed)
   08/01/18 0700  Clinical Encounter Type  Visited With Patient  Visit Type Initial;Spiritual support  Recommendations Follow-up, as needed.  Spiritual Encounters  Spiritual Needs Prayer;Emotional  Stress Factors  Patient Stress Factors Health changes   Patient was thankful for prayer and emotional support. Patient does not seem to be fully aware of her medical issues. Chaplain directed her to rely on her care health team. Patient is a woman of faith and relies on God for her life and health.

## 2018-08-01 NOTE — Progress Notes (Signed)
Patient hemoglobin resulted at 9.2 today. Called Beverley Fiedler NP the ordering provider and informed her of the pre transfusion result of 9.2 hemoglobin. Beverlee Nims decided to stop with one unit of blood verses the two units ordered due to pre transfusion hemo of 9.2 result. I confirmed with Beverlee Nims that I should stop with one unit of blood. Blood bank notified of change to one unit.

## 2018-08-01 NOTE — Discharge Instructions (Signed)

## 2018-08-02 LAB — BPAM RBC
BLOOD PRODUCT EXPIRATION DATE: 201911052359
Blood Product Expiration Date: 201911052359
ISSUE DATE / TIME: 201910150824
UNIT TYPE AND RH: 5100
Unit Type and Rh: 5100

## 2018-08-02 LAB — TYPE AND SCREEN
ABO/RH(D): O POS
Antibody Screen: NEGATIVE
Unit division: 0
Unit division: 0

## 2018-08-04 ENCOUNTER — Ambulatory Visit: Payer: Medicare HMO | Admitting: Urology

## 2018-09-23 ENCOUNTER — Other Ambulatory Visit: Payer: Self-pay

## 2018-09-23 ENCOUNTER — Inpatient Hospital Stay
Admission: EM | Admit: 2018-09-23 | Discharge: 2018-09-29 | DRG: 464 | Disposition: A | Discharge status: Skilled Nursing Facility | Payer: Medicare Other | Attending: Internal Medicine | Admitting: Internal Medicine

## 2018-09-23 ENCOUNTER — Emergency Department: Payer: Medicare Other

## 2018-09-23 ENCOUNTER — Encounter: Payer: Self-pay | Admitting: Emergency Medicine

## 2018-09-23 DIAGNOSIS — Y792 Prosthetic and other implants, materials and accessory orthopedic devices associated with adverse incidents: Secondary | ICD-10-CM | POA: Diagnosis present

## 2018-09-23 DIAGNOSIS — S73005A Unspecified dislocation of left hip, initial encounter: Secondary | ICD-10-CM | POA: Diagnosis present

## 2018-09-23 DIAGNOSIS — N183 Chronic kidney disease, stage 3 (moderate): Secondary | ICD-10-CM | POA: Diagnosis present

## 2018-09-23 DIAGNOSIS — Z66 Do not resuscitate: Secondary | ICD-10-CM | POA: Diagnosis present

## 2018-09-23 DIAGNOSIS — K219 Gastro-esophageal reflux disease without esophagitis: Secondary | ICD-10-CM | POA: Diagnosis present

## 2018-09-23 DIAGNOSIS — T84021A Dislocation of internal left hip prosthesis, initial encounter: Principal | ICD-10-CM | POA: Diagnosis present

## 2018-09-23 DIAGNOSIS — L89612 Pressure ulcer of right heel, stage 2: Secondary | ICD-10-CM | POA: Diagnosis present

## 2018-09-23 DIAGNOSIS — D62 Acute posthemorrhagic anemia: Secondary | ICD-10-CM | POA: Diagnosis not present

## 2018-09-23 DIAGNOSIS — L89622 Pressure ulcer of left heel, stage 2: Secondary | ICD-10-CM | POA: Diagnosis present

## 2018-09-23 DIAGNOSIS — I4510 Unspecified right bundle-branch block: Secondary | ICD-10-CM | POA: Diagnosis present

## 2018-09-23 DIAGNOSIS — H547 Unspecified visual loss: Secondary | ICD-10-CM | POA: Diagnosis present

## 2018-09-23 DIAGNOSIS — M810 Age-related osteoporosis without current pathological fracture: Secondary | ICD-10-CM | POA: Diagnosis present

## 2018-09-23 DIAGNOSIS — Z9359 Other cystostomy status: Secondary | ICD-10-CM | POA: Diagnosis not present

## 2018-09-23 DIAGNOSIS — G629 Polyneuropathy, unspecified: Secondary | ICD-10-CM | POA: Diagnosis present

## 2018-09-23 DIAGNOSIS — Z87891 Personal history of nicotine dependence: Secondary | ICD-10-CM | POA: Diagnosis not present

## 2018-09-23 DIAGNOSIS — Z96643 Presence of artificial hip joint, bilateral: Secondary | ICD-10-CM | POA: Diagnosis present

## 2018-09-23 DIAGNOSIS — Z90710 Acquired absence of both cervix and uterus: Secondary | ICD-10-CM | POA: Diagnosis not present

## 2018-09-23 DIAGNOSIS — I129 Hypertensive chronic kidney disease with stage 1 through stage 4 chronic kidney disease, or unspecified chronic kidney disease: Secondary | ICD-10-CM | POA: Diagnosis present

## 2018-09-23 DIAGNOSIS — M5136 Other intervertebral disc degeneration, lumbar region: Secondary | ICD-10-CM | POA: Diagnosis present

## 2018-09-23 DIAGNOSIS — Z96653 Presence of artificial knee joint, bilateral: Secondary | ICD-10-CM | POA: Diagnosis present

## 2018-09-23 DIAGNOSIS — R509 Fever, unspecified: Secondary | ICD-10-CM

## 2018-09-23 DIAGNOSIS — S82832A Other fracture of upper and lower end of left fibula, initial encounter for closed fracture: Secondary | ICD-10-CM

## 2018-09-23 DIAGNOSIS — G8918 Other acute postprocedural pain: Secondary | ICD-10-CM

## 2018-09-23 LAB — BASIC METABOLIC PANEL
Anion gap: 8 (ref 5–15)
BUN: 33 mg/dL — ABNORMAL HIGH (ref 8–23)
CO2: 29 mmol/L (ref 22–32)
Calcium: 9.4 mg/dL (ref 8.9–10.3)
Chloride: 101 mmol/L (ref 98–111)
Creatinine, Ser: 0.97 mg/dL (ref 0.44–1.00)
GFR calc non Af Amer: 53 mL/min — ABNORMAL LOW (ref >60)
Glucose, Bld: 95 mg/dL (ref 70–99)
Potassium: 5.4 mmol/L — ABNORMAL HIGH (ref 3.5–5.1)
Sodium: 138 mmol/L (ref 135–145)

## 2018-09-23 LAB — CBC WITH DIFFERENTIAL/PLATELET
ABS IMMATURE GRANULOCYTES: 0.02 10*3/uL (ref 0.00–0.07)
BASOS ABS: 0 10*3/uL (ref 0.0–0.1)
Basophils Relative: 1 %
Eosinophils Absolute: 0.3 10*3/uL (ref 0.0–0.5)
Eosinophils Relative: 4 %
HCT: 32 % — ABNORMAL LOW (ref 36.0–46.0)
Hemoglobin: 9.6 g/dL — ABNORMAL LOW (ref 12.0–15.0)
IMMATURE GRANULOCYTES: 0 %
Lymphocytes Relative: 34 %
Lymphs Abs: 2.8 10*3/uL (ref 0.7–4.0)
MCH: 27.4 pg (ref 26.0–34.0)
MCHC: 30 g/dL (ref 30.0–36.0)
MCV: 91.4 fL (ref 80.0–100.0)
Monocytes Absolute: 0.8 10*3/uL (ref 0.1–1.0)
Monocytes Relative: 10 %
Neutro Abs: 4.3 10*3/uL (ref 1.7–7.7)
Neutrophils Relative %: 51 %
Platelets: 288 10*3/uL (ref 150–400)
RBC: 3.5 MIL/uL — ABNORMAL LOW (ref 3.87–5.11)
RDW: 16.9 % — ABNORMAL HIGH (ref 11.5–15.5)
WBC: 8.3 10*3/uL (ref 4.0–10.5)
nRBC: 0 % (ref 0.0–0.2)

## 2018-09-23 MED ORDER — PROPOFOL 10 MG/ML IV BOLUS
0.5000 mg/kg | Freq: Once | INTRAVENOUS | Status: DC
  Filled 2018-09-23: qty 20

## 2018-09-23 MED ORDER — PROPOFOL 10 MG/ML IV BOLUS
INTRAVENOUS | Status: AC | PRN
  Administered 2018-09-23 (×2): 28 mg via INTRAVENOUS

## 2018-09-23 NOTE — ED Triage Notes (Signed)
Pt presents to ed via [facility] from [facility] with c/o left hip dislocation. No fall reported. pt c/o no pain at this time. Hx of hip surgery recently. X-ray today showed dislocation. No pain with movement. No known allergies. DNR on file. Pressure Wounds on bilateral lower feet noted to be draining by facility.

## 2018-09-23 NOTE — ED Notes (Signed)
Pt transport from ED13 to [id number]

## 2018-09-23 NOTE — Sedation Documentation (Signed)
Propofol administered by Karena Addison, RN
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
## 2018-09-23 NOTE — ED Notes (Signed)
Pt writer emptied 350cc from pt foley before transport.

## 2018-09-23 NOTE — H&P (Signed)
[place] [facility] [place] NAME: [name]    MR#:  [id number]  DATE OF BIRTH:  05-10-1931  DATE OF ADMISSION:  09/23/2018  PRIMARY CARE PHYSICIAN: Marisa Hua, MD   REQUESTING/REFERRING PHYSICIAN:   CHIEF COMPLAINT:   Chief Complaint  Patient presents with  . Hip Injury    HISTORY OF PRESENT ILLNESS: [name]  is a 84 y.o. female, NH resident, with a known history of osteoarthritis, osteoporosis, chronic suprapubic catheter that is being changed every month, hypertension, chronic anemia, recent left hip fracture, status post surgical repair 2 months ago. Patient was transferred to emergency room for left hip dislocation noted per x-ray done at her nursing home facility.  Patient denies any pain and she does not recall any recent fall or trauma. Lower extremity x-ray confirms left hip dislocation, but also reveals mildly displaced fibular head and neck fracture and possible lateral tibial fracture. Blood test done emergency room, including CBC and CMP are grossly unremarkable except for hemoglobin level at 9.6 and potassium at 5.4. EKG shows normal sinus rhythm with heart rate at 71 bpm, old RBBB, no acute ischemic changes. Patient is admitted for further evaluation and treatment.  PAST MEDICAL HISTORY:   Past Medical History:  Diagnosis Date  . Anemia   . Chronic kidney disease, stage 3 ([place])   . Degenerative joint disease (DJD) of lumbar spine   . Diastolic dysfunction   . Dizziness   . Edema   . ETD (eustachian tube dysfunction)   . Heartburn   . History of rectal bleeding   . HTN (hypertension)   . Hypercalcemia   . Intracranial aneurysm   . Low vision, one eye   . Nephritis and nephropathy   . Osteoporosis   . Pyuria   . Urge incontinence   . Urinary frequency   . Urinary retention     PAST SURGICAL HISTORY:  Past Surgical History:  Procedure Laterality Date  . dilation of esophageal web    .  ESOPHAGOGASTRODUODENOSCOPY (EGD) WITH PROPOFOL N/A 12/06/2017   Procedure: ESOPHAGOGASTRODUODENOSCOPY (EGD) WITH PROPOFOL;  Surgeon: Lucilla Lame, MD;  Location: [facility] ENDOSCOPY;  Service: Endoscopy;  Laterality: N/A;  . FRACTURE SURGERY Left    femur  . IR GENERIC HISTORICAL  08/05/2016   IR CATHETER TUBE CHANGE 08/05/2016 [name], MD [facility]INTER[facility]  . IR GENERIC HISTORICAL  09/17/2016   IR CATHETER TUBE CHANGE 09/17/2016 [facility]  . IR GENERIC HISTORICAL  11/19/2016   IR CATHETER TUBE CHANGE 11/19/2016 [name], MD [facility]  . IR GENERIC HISTORICAL  11/29/2016   IR CATHETER TUBE CHANGE 11/29/2016 [name], DO [facility]  . JOINT REPLACEMENT Bilateral   . TOTAL HIP ARTHROPLASTY WITH HARDWARE REMOVAL Left 07/24/2018   Procedure: TOTAL HIP ARTHROPLASTY WITH HARDWARE REMOVAL-2 screws;  Surgeon: Corky Mull, MD;  Location: [facility] ORS;  Service: Orthopedics;  Laterality: Left;  picture taken, and placed on chart, of 2 screws that were removed   . TOTAL KNEE ARTHROPLASTY Bilateral   . VAGINAL HYSTERECTOMY  1967   ovaries also removed because of fibroida    SOCIAL HISTORY:  Social History   Tobacco Use  . Smoking status: Former Smoker    Last attempt to quit: 07/23/1964    Years since quitting: 54.2  . Smokeless tobacco: Never Used  . Tobacco comment: quit 1965  Substance Use Topics  . Alcohol use: No    Alcohol/week: 0.0 standard drinks  FAMILY HISTORY:  Family History  Problem Relation Age of Onset  . Kidney disease Neg Hx   . Bladder Cancer Neg Hx   . Kidney cancer Neg Hx     DRUG ALLERGIES: No Known Allergies  REVIEW OF SYSTEMS:   CONSTITUTIONAL: No fever, fatigue or weakness.  EYES: No changes in vision.  EARS, NOSE, AND THROAT: No tinnitus or ear pain.  RESPIRATORY: No cough, shortness of breath, wheezing or hemoptysis.  CARDIOVASCULAR: No chest pain, orthopnea, edema.  GASTROINTESTINAL: No nausea, vomiting, diarrhea or abdominal pain.   GENITOURINARY: No dysuria, hematuria.  ENDOCRINE: No polyuria, nocturia. HEMATOLOGY: No bleeding. SKIN: [place] for bilateral heel pressure ulcers. MUSCULOSKELETAL: Left leg is shorter and externally rotated.   NEUROLOGIC: No focal weakness.  PSYCHIATRY: No anxiety or depression.   MEDICATIONS AT HOME:  Prior to Admission medications   Medication Sig Start Date End Date Taking? Authorizing Provider  acetaminophen (TYLENOL) 500 MG tablet Take 1,000 mg by mouth every 8 (eight) hours as needed for mild pain.    Yes [provider]  amLODipine (NORVASC) 10 MG tablet Take 10 mg by mouth daily.  12/13/16  Yes [provider]  atenolol (TENORMIN) 25 MG tablet Take 1 tablet (25 mg total) by mouth daily. 06/10/16  Yes Mody, Ulice Bold, MD  bisacodyl (DULCOLAX) 10 MG suppository Place 10 mg rectally daily as needed for moderate constipation.   Yes [provider]  bisacodyl (DULCOLAX) 5 MG EC tablet Take 5-10 mg by mouth daily as needed for moderate constipation.   Yes [provider]  cholecalciferol (VITAMIN D) 1000 units tablet Take 1,000 Units by mouth daily. 11/08/17  Yes [provider]  FEROSUL 325 (65 Fe) MG tablet Take 325 mg by mouth 2 (two) times daily. 11/08/17  Yes [provider]  furosemide (LASIX) 20 MG tablet Take 20 mg by mouth daily. 11/08/17  Yes [provider]  gabapentin (NEURONTIN) 300 MG capsule Take 300 mg by mouth at bedtime.  11/08/17  Yes [provider]  ipratropium-albuterol (DUONEB) 0.5-2.5 (3) MG/3ML SOLN Take 3 mLs by nebulization every 6 (six) hours as needed.    Yes [provider]  losartan (COZAAR) 50 MG tablet Take 1 tablet (50 mg total) by mouth daily. Hold if SBP <110 07/25/18  Yes Fritzi Mandes, MD  Multiple Vitamin (MULTIVITAMIN) tablet Take 1 tablet by mouth daily. 06/10/16  Yes Mody, Ulice Bold, MD  pantoprazole (PROTONIX) 40 MG tablet Take 40 mg by mouth daily.   Yes [provider]   polyethylene glycol (MIRALAX / GLYCOLAX) packet Take 17 g by mouth daily.   Yes [provider]  traMADol (ULTRAM) 50 MG tablet Take 1 tablet (50 mg total) by mouth every 8 (eight) hours as needed for moderate pain. 07/25/18  Yes Fritzi Mandes, MD  amoxicillin-clavulanate (AUGMENTIN) 875-125 MG tablet Take 1 tablet by mouth 2 (two) times daily.    [provider]  oxybutynin (DITROPAN) 5 MG tablet Take 5 mg by mouth as directed. 30 minutes prior to monthly catheter replacemeent 12/04/16   [provider]      PHYSICAL EXAMINATION:   VITAL SIGNS: Blood pressure 140/73, pulse 73, temperature 98.8 F (37.1 C), temperature source Oral, resp. rate 14, height 5\' 3"  (1.6 m), weight 56 kg, SpO2 98 %.  GENERAL:  84 y.o.-year-old patient lying in the bed with no acute distress.  EYES: Pupils equal, round, reactive to light and accommodation. No scleral icterus. Extraocular muscles intact.  HEENT: Head  atraumatic, normocephalic. Oropharynx and nasopharynx clear.  NECK:  Supple, no jugular venous distention. No thyroid enlargement, no tenderness.  LUNGS: Normal breath sounds bilaterally, no wheezing, rales,rhonchi or crepitation. No use of accessory muscles of respiration.  CARDIOVASCULAR: S1, S2 normal. No S3/S4.  ABDOMEN: Soft, nontender, nondistended. Bowel sounds present. No organomegaly or mass.  EXTREMITIES: Left leg is shortened and externally rotated.  There is no tenderness to palpation of the left hip or left knee area.  No deformities noted. NEUROLOGIC: No focal weakness PSYCHIATRIC: The patient is alert and oriented x 3.  SKIN: No obvious rash.  Bilateral heel pressure ulcers are noted, left side seems to be a stage IV and right side is unstageable.  LABORATORY PANEL:   CBC Recent Labs  Lab 09/23/18 1702  WBC 8.3  HGB 9.6*  HCT 32.0*  PLT 288  MCV 91.4  MCH 27.4  MCHC 30.0  RDW 16.9*  LYMPHSABS 2.8  MONOABS 0.8  EOSABS 0.3  BASOSABS 0.0    ------------------------------------------------------------------------------------------------------------------  Chemistries  Recent Labs  Lab 09/23/18 1702  NA 138  K 5.4*  CL 101  CO2 29  GLUCOSE 95  BUN 33*  CREATININE 0.97  CALCIUM 9.4   ------------------------------------------------------------------------------------------------------------------ estimated creatinine clearance is 35.1 mL/min (by C-G formula based on SCr of 0.97 mg/dL). ------------------------------------------------------------------------------------------------------------------ No results for input(s): TSH, T4TOTAL, T3FREE, THYROIDAB in the last 72 hours.  Invalid input(s): FREET3   Coagulation profile No results for input(s): INR, PROTIME in the last 168 hours. ------------------------------------------------------------------------------------------------------------------- No results for input(s): DDIMER in the last 72 hours. -------------------------------------------------------------------------------------------------------------------  Cardiac Enzymes No results for input(s): CKMB, TROPONINI, MYOGLOBIN in the last 168 hours.  Invalid input(s): CK ------------------------------------------------------------------------------------------------------------------ Invalid input(s): POCBNP  ---------------------------------------------------------------------------------------------------------------  Urinalysis    Component Value Date/Time   COLORURINE YELLOW (A) 07/24/2018 1133   APPEARANCEUR CLOUDY (A) 07/24/2018 1133   APPEARANCEUR Cloudy (A) 04/23/2016 1451   LABSPEC 1.010 07/24/2018 1133   LABSPEC 1.011 11/25/2011 1204   PHURINE 5.0 07/24/2018 1133   GLUCOSEU NEGATIVE 07/24/2018 1133   GLUCOSEU Negative 11/25/2011 1204   HGBUR MODERATE (A) 07/24/2018 1133   BILIRUBINUR NEGATIVE 07/24/2018 1133   BILIRUBINUR Negative 04/23/2016 1451   BILIRUBINUR Negative 11/25/2011 1204    KETONESUR NEGATIVE 07/24/2018 1133   PROTEINUR 30 (A) 07/24/2018 1133   NITRITE POSITIVE (A) 07/24/2018 1133   LEUKOCYTESUR LARGE (A) 07/24/2018 1133   LEUKOCYTESUR 3+ (A) 04/23/2016 1451   LEUKOCYTESUR Negative 11/25/2011 1204     RADIOLOGY: Dg Knee Complete 4 Views Left  Result Date: 09/23/2018 CLINICAL DATA:  Left knee pain. EXAM: LEFT KNEE - COMPLETE 4+ VIEW COMPARISON:  None. FINDINGS: There is a total knee arthroplasty with the components intact. There is also a long lateral femoral sideplate and multiple screws transfixing a prior condylar and supracondylar femur fracture. No complicating features. There is a mildly displaced fracture involving the fibular head and neck. I am also suspicious of there may be a lateral cortical fracture involving the tibia at the same level. CT may be helpful for further evaluation. IMPRESSION: Mildly displaced fibular head and neck fracture and possible lateral tibial fracture. CT may be helpful for further evaluation. Electronically Signed   By: Marijo Sanes M.D.   On: 09/23/2018 19:42   Dg Hip Port Unilat W Or Wo Pelvis 1 View Left  Result Date: 09/23/2018 CLINICAL DATA:  Postreduction. EXAM: DG HIP (WITH OR WITHOUT PELVIS) 1V PORT LEFT COMPARISON:  LEFT hip radiograph September 23, 2018 at 1723 hours FINDINGS: Unchanged  dislocation LEFT hip total arthroplasty with femoral component projecting about the acetabular cup. No fracture deformity. Osteopenia. Advanced RIGHT hip osteoarthrosis. Catheter projects over the pelvis. Soft tissue planes are unchanged. IMPRESSION: Unchanged LEFT hip dislocation, status post LEFT hip total arthroplasty. Electronically Signed   By: Elon Alas M.D.   On: 09/23/2018 19:39   Dg Hip Unilat W Or Wo Pelvis 2-3 Views Left  Result Date: 09/23/2018 CLINICAL DATA:  Left hip dislocation EXAM: DG HIP (WITH OR WITHOUT PELVIS) 2-3V LEFT COMPARISON:  07/24/2018 FINDINGS: Left hip arthroplasty with superior dislocation of the  of the femoral head relative to the acetabular component. Associated acetabular protrusio. Moderate degenerative changes of the right hip with mild flattening of the femoral head. Visualized bony pelvis appears intact. IMPRESSION: Left hip arthroplasty with superior dislocation of the femoral head component relative to the acetabulum, as above. Associated acetabular protrusio. Moderate degenerative changes of the right hip. Electronically Signed   By: Julian Hy M.D.   On: 09/23/2018 18:02    EKG: Orders placed or performed during the hospital encounter of 09/23/18  . EKG 12-Lead  . EKG 12-Lead    IMPRESSION AND PLAN:  1.  Left hip dislocation.  Will consult orthopedics for further evaluation and treatment. 2.  Left fibula fracture.  Will consult orthopedics for further evaluation and treatment. 3.  Bilateral heel pressure ulcers.  Continue management per wound care team recommendations. 4.  Anemia of chronic diseases.  Hemoglobin level is stable at 9.6, no active bleeding.  Continue to monitor hemoglobin level closely. 5.  Hypertension, stable, resume home medications. 6.  Chronic suprapubic catheter, will replace monthly.  All the records are reviewed and case discussed with ED provider. Management plans discussed with the patient, family and they are in agreement.  CODE STATUS: Patient agrees to be full code for possible surgical procedure by orthopedics, but otherwise she is DNR/DNI. Code Status History    Date Active Date Inactive Code Status Order ID Comments User Context   07/24/2018 1502 07/25/2018 1855 DNR [id number]  Gladstone Lighter, MD Inpatient   12/24/2017 1556 12/29/2017 2033 Full Code [id number]  Dustin Flock, MD Inpatient   12/24/2017 1417 12/24/2017 1556 DNR [id number]  Dustin Flock, MD ED   12/06/2017 1406 12/06/2017 2006 DNR [id number]  Fritzi Mandes, MD Inpatient   12/03/2017 1506 12/06/2017 1406 Full Code [id number]  Idelle Crouch, MD ED   03/28/2017 1649 03/30/2017  2157 DNR [id number]  Hillary Bow, MD ED   06/08/2016 2040 06/11/2016 2141 Full Code [id number]  Hower, Aaron Mose, MD ED    Questions for Most Recent Historical Code Status (Order [id number])    Question Answer Comment   In the event of cardiac or respiratory ARREST Do not call a "code blue"    In the event of cardiac or respiratory ARREST Do not perform Intubation, CPR, defibrillation or ACLS    In the event of cardiac or respiratory ARREST Use medication by any route, position, wound care, and other measures to relive pain and suffering. May use oxygen, suction and manual treatment of airway obstruction as needed for comfort.         Advance Directive Documentation     Most Recent Value  Type of Advance Directive  Out of facility DNR (pink MOST or yellow form)  Pre-existing out of facility DNR order (yellow form or pink MOST form)  Yellow form placed in chart (order not valid for inpatient use)  "MOST" Form in Place?  -  TOTAL TIME TAKING CARE OF THIS PATIENT: 50 minutes.    Amelia Jo M.D on 09/23/2018 at 10:39 PM  Between 7am to 6pm - Pager - [phone]  After 6pm go to [email or web address] - password [email or web address] [facility]  [phone]  CC: Primary care physician; Marisa Hua, MD

## 2018-09-23 NOTE — Anesthesia Preprocedure Evaluation (Addendum)
Anesthesia Evaluation  Patient identified by MRN, date of birth, ID band Patient awake    Reviewed: Allergy & Precautions, H&P , NPO status , Patient's Chart, lab work & pertinent test results  Airway Mallampati: II       Dental  (+) Edentulous Lower, Edentulous Upper   Pulmonary former smoker,    breath sounds clear to auscultation       Cardiovascular hypertension,  Rhythm:regular     Neuro/Psych Intracranial aneurysm negative psych ROS   GI/Hepatic negative GI ROS, Neg liver ROS,   Endo/Other  negative endocrine ROS  Renal/GU CRFRenal disease  negative genitourinary   Musculoskeletal  (+) Arthritis ,   Abdominal   Peds  Hematology  (+) Blood dyscrasia, anemia ,   Anesthesia Other Findings Past Medical History: No date: Anemia No date: Chronic kidney disease, stage 3 ([facility]) No date: Degenerative joint disease (DJD) of lumbar spine No date: Diastolic dysfunction No date: Dizziness No date: Edema No date: ETD (eustachian tube dysfunction) No date: Heartburn No date: History of rectal bleeding No date: HTN (hypertension) No date: Hypercalcemia No date: Intracranial aneurysm No date: Low vision, one eye No date: Nephritis and nephropathy No date: Osteoporosis No date: Pyuria No date: Urge incontinence No date: Urinary frequency No date: Urinary retention   Reproductive/Obstetrics negative OB ROS                          Anesthesia Physical Anesthesia Plan  ASA: III  Anesthesia Plan: General   Post-op Pain Management:    Induction:   PONV Risk Score and Plan:   Airway Management Planned:   Additional Equipment:   Intra-op Plan:   Post-operative Plan:   Informed Consent: I have reviewed the patients History and Physical, chart, labs and discussed the procedure including the risks, benefits and alternatives for the proposed anesthesia with the patient or authorized  representative who has indicated his/her understanding and acceptance.   Dental Advisory Given  Plan Discussed with: Anesthesiologist, CRNA and Surgeon  Anesthesia Plan Comments:         Anesthesia Quick Evaluation

## 2018-09-24 ENCOUNTER — Inpatient Hospital Stay: Payer: Medicare Other | Admitting: Anesthesiology

## 2018-09-24 ENCOUNTER — Inpatient Hospital Stay: Payer: Medicare Other

## 2018-09-24 ENCOUNTER — Encounter
Admission: EM | Disposition: A | Discharge status: Skilled Nursing Facility | Payer: Self-pay | Source: Home / Self Care | Attending: Internal Medicine

## 2018-09-24 HISTORY — PX: HIP CLOSED REDUCTION: SHX983

## 2018-09-24 LAB — BASIC METABOLIC PANEL
ANION GAP: 8 (ref 5–15)
BUN: 28 mg/dL — ABNORMAL HIGH (ref 8–23)
CO2: 29 mmol/L (ref 22–32)
Calcium: 9.2 mg/dL (ref 8.9–10.3)
Chloride: 101 mmol/L (ref 98–111)
Creatinine, Ser: 0.87 mg/dL (ref 0.44–1.00)
GFR calc non Af Amer: >60 mL/min (ref >60)
Glucose, Bld: 111 mg/dL — ABNORMAL HIGH (ref 70–99)
Potassium: 4.1 mmol/L (ref 3.5–5.1)
Sodium: 138 mmol/L (ref 135–145)

## 2018-09-24 LAB — SURGICAL PCR SCREEN
MRSA, PCR: POSITIVE — AB
STAPHYLOCOCCUS AUREUS: POSITIVE — AB

## 2018-09-24 LAB — CBC
HCT: 30.9 % — ABNORMAL LOW (ref 36.0–46.0)
Hemoglobin: 9.5 g/dL — ABNORMAL LOW (ref 12.0–15.0)
MCH: 27 pg (ref 26.0–34.0)
MCHC: 30.7 g/dL (ref 30.0–36.0)
MCV: 87.8 fL (ref 80.0–100.0)
Platelets: 256 10*3/uL (ref 150–400)
RBC: 3.52 MIL/uL — ABNORMAL LOW (ref 3.87–5.11)
RDW: 16.7 % — ABNORMAL HIGH (ref 11.5–15.5)
WBC: 8.6 10*3/uL (ref 4.0–10.5)
nRBC: 0 % (ref 0.0–0.2)

## 2018-09-24 LAB — GLUCOSE, CAPILLARY: Glucose-Capillary: 95 mg/dL (ref 70–99)

## 2018-09-24 SURGERY — CLOSED MANIPULATION, JOINT, HIP
Anesthesia: General | Laterality: Left

## 2018-09-24 MED ORDER — SUGAMMADEX SODIUM 200 MG/2ML IV SOLN
INTRAVENOUS | Status: DC | PRN
  Administered 2018-09-24: 200 mg via INTRAVENOUS

## 2018-09-24 MED ORDER — ENOXAPARIN SODIUM 30 MG/0.3ML ~~LOC~~ SOLN: 30.0000 mg | SUBCUTANEOUS | Status: DC

## 2018-09-24 MED ORDER — LACTATED RINGERS IV SOLN: INTRAVENOUS | Status: DC

## 2018-09-24 MED ORDER — CEFAZOLIN SODIUM-DEXTROSE 1-4 GM/50ML-% IV SOLN
1.0000 g | Freq: Once | INTRAVENOUS | Status: AC
  Administered 2018-09-25: 1 g via INTRAVENOUS
  Filled 2018-09-24: qty 50

## 2018-09-24 MED ORDER — TRAZODONE HCL 50 MG PO TABS: 25.0000 mg | ORAL_TABLET | Freq: Every evening | ORAL | Status: DC | PRN

## 2018-09-24 MED ORDER — HYDROCODONE-ACETAMINOPHEN 5-325 MG PO TABS
1.0000 | ORAL_TABLET | ORAL | Status: DC | PRN
  Administered 2018-09-24 – 2018-09-26 (×3): 1 via ORAL
  Filled 2018-09-24 (×3): qty 1

## 2018-09-24 MED ORDER — LACTATED RINGERS IV SOLN
INTRAVENOUS | Status: DC | PRN
  Administered 2018-09-24: 08:00:00 via INTRAVENOUS

## 2018-09-24 MED ORDER — SUCCINYLCHOLINE CHLORIDE 20 MG/ML IJ SOLN
INTRAMUSCULAR | Status: DC | PRN
  Administered 2018-09-24: 40 mg via INTRAVENOUS
  Administered 2018-09-24: 60 mg via INTRAVENOUS

## 2018-09-24 MED ORDER — DOCUSATE SODIUM 100 MG PO CAPS: 100.0000 mg | ORAL_CAPSULE | Freq: Two times a day (BID) | ORAL | Status: DC

## 2018-09-24 MED ORDER — ACETAMINOPHEN 500 MG PO TABS: 500.0000 mg | ORAL_TABLET | Freq: Four times a day (QID) | ORAL | Status: AC

## 2018-09-24 MED ORDER — AMLODIPINE BESYLATE 10 MG PO TABS
10.0000 mg | ORAL_TABLET | Freq: Every day | ORAL | Status: DC
  Administered 2018-09-24 – 2018-09-29 (×5): 10 mg via ORAL
  Filled 2018-09-24 (×5): qty 1

## 2018-09-24 MED ORDER — MUPIROCIN 2 % EX OINT
1.0000 "application " | TOPICAL_OINTMENT | Freq: Two times a day (BID) | CUTANEOUS | Status: AC
  Administered 2018-09-24 – 2018-09-29 (×6): 1 via NASAL
  Filled 2018-09-24 (×3): qty 22

## 2018-09-24 MED ORDER — ATENOLOL 25 MG PO TABS: 25.0000 mg | ORAL_TABLET | Freq: Every day | ORAL | Status: DC

## 2018-09-24 MED ORDER — ATENOLOL 25 MG PO TABS
25.0000 mg | ORAL_TABLET | Freq: Every day | ORAL | Status: DC
  Administered 2018-09-24 – 2018-09-29 (×5): 25 mg via ORAL
  Filled 2018-09-24 (×5): qty 1

## 2018-09-24 MED ORDER — METOCLOPRAMIDE HCL 5 MG/ML IJ SOLN: 5.0000 mg | Freq: Three times a day (TID) | INTRAMUSCULAR | Status: DC | PRN

## 2018-09-24 MED ORDER — BISACODYL 10 MG RE SUPP: 10.0000 mg | Freq: Every day | RECTAL | Status: DC | PRN

## 2018-09-24 MED ORDER — FENTANYL CITRATE (PF) 100 MCG/2ML IJ SOLN: 25.0000 ug | INTRAMUSCULAR | Status: DC | PRN

## 2018-09-24 MED ORDER — FERROUS SULFATE 325 (65 FE) MG PO TABS
325.0000 mg | ORAL_TABLET | Freq: Two times a day (BID) | ORAL | Status: DC
  Administered 2018-09-24 – 2018-09-29 (×9): 325 mg via ORAL
  Filled 2018-09-24 (×10): qty 1

## 2018-09-24 MED ORDER — PROPOFOL 10 MG/ML IV BOLUS: INTRAVENOUS | Status: DC | PRN

## 2018-09-24 MED ORDER — ADULT MULTIVITAMIN W/MINERALS CH
1.0000 | ORAL_TABLET | Freq: Every day | ORAL | Status: DC
  Administered 2018-09-26 – 2018-09-29 (×4): 1 via ORAL
  Filled 2018-09-24 (×3): qty 1

## 2018-09-24 MED ORDER — PROPOFOL 10 MG/ML IV BOLUS
INTRAVENOUS | Status: DC | PRN
  Administered 2018-09-24 (×2): 40 mg via INTRAVENOUS
  Administered 2018-09-24: 20 mg via INTRAVENOUS
  Administered 2018-09-24: 40 mg via INTRAVENOUS
  Administered 2018-09-24: 60 mg via INTRAVENOUS

## 2018-09-24 MED ORDER — IPRATROPIUM-ALBUTEROL 0.5-2.5 (3) MG/3ML IN SOLN: 3.0000 mL | Freq: Four times a day (QID) | RESPIRATORY_TRACT | Status: DC | PRN

## 2018-09-24 MED ORDER — ROCURONIUM BROMIDE 100 MG/10ML IV SOLN
INTRAVENOUS | Status: DC | PRN
  Administered 2018-09-24: 20 mg via INTRAVENOUS
  Administered 2018-09-24: 40 mg via INTRAVENOUS

## 2018-09-24 MED ORDER — POLYETHYLENE GLYCOL 3350 17 G PO PACK
17.0000 g | PACK | Freq: Every day | ORAL | Status: DC
  Administered 2018-09-26 – 2018-09-29 (×4): 17 g via ORAL
  Filled 2018-09-24 (×4): qty 1

## 2018-09-24 MED ORDER — FUROSEMIDE 20 MG PO TABS
20.0000 mg | ORAL_TABLET | Freq: Every day | ORAL | Status: DC
  Administered 2018-09-24 – 2018-09-29 (×4): 20 mg via ORAL
  Filled 2018-09-24 (×4): qty 1

## 2018-09-24 MED ORDER — PANTOPRAZOLE SODIUM 40 MG PO TBEC
40.0000 mg | DELAYED_RELEASE_TABLET | Freq: Every day | ORAL | Status: DC
  Administered 2018-09-24 – 2018-09-29 (×5): 40 mg via ORAL
  Filled 2018-09-24 (×5): qty 1

## 2018-09-24 MED ORDER — DOCUSATE SODIUM 100 MG PO CAPS
100.0000 mg | ORAL_CAPSULE | Freq: Two times a day (BID) | ORAL | Status: DC
  Administered 2018-09-24: 100 mg via ORAL
  Filled 2018-09-24 (×2): qty 1

## 2018-09-24 MED ORDER — ACETAMINOPHEN 650 MG RE SUPP: 650.0000 mg | Freq: Four times a day (QID) | RECTAL | Status: DC | PRN

## 2018-09-24 MED ORDER — LOSARTAN POTASSIUM 50 MG PO TABS
50.0000 mg | ORAL_TABLET | Freq: Every day | ORAL | Status: DC
  Administered 2018-09-24 – 2018-09-29 (×5): 50 mg via ORAL
  Filled 2018-09-24 (×5): qty 1

## 2018-09-24 MED ORDER — ONDANSETRON HCL 4 MG PO TABS: 4.0000 mg | ORAL_TABLET | Freq: Four times a day (QID) | ORAL | Status: DC | PRN

## 2018-09-24 MED ORDER — SODIUM CHLORIDE 0.9 % IV SOLN
INTRAVENOUS | Status: DC
  Administered 2018-09-24 (×2): via INTRAVENOUS

## 2018-09-24 MED ORDER — METOCLOPRAMIDE HCL 10 MG PO TABS: 5.0000 mg | ORAL_TABLET | Freq: Three times a day (TID) | ORAL | Status: DC | PRN

## 2018-09-24 MED ORDER — BISACODYL 5 MG PO TBEC: 5.0000 mg | DELAYED_RELEASE_TABLET | Freq: Every day | ORAL | Status: DC | PRN

## 2018-09-24 MED ORDER — GABAPENTIN 300 MG PO CAPS
300.0000 mg | ORAL_CAPSULE | Freq: Every day | ORAL | Status: DC
  Administered 2018-09-24 – 2018-09-28 (×4): 300 mg via ORAL
  Filled 2018-09-24 (×5): qty 1

## 2018-09-24 MED ORDER — ONDANSETRON HCL 4 MG/2ML IJ SOLN: 4.0000 mg | Freq: Four times a day (QID) | INTRAMUSCULAR | Status: DC | PRN

## 2018-09-24 MED ORDER — PROPOFOL 500 MG/50ML IV EMUL
INTRAVENOUS | Status: AC
  Filled 2018-09-24: qty 50

## 2018-09-24 MED ORDER — MORPHINE SULFATE (PF) 2 MG/ML IV SOLN: 0.5000 mg | INTRAVENOUS | Status: DC | PRN

## 2018-09-24 MED ORDER — VITAMIN D 25 MCG (1000 UNIT) PO TABS
1000.0000 [IU] | ORAL_TABLET | Freq: Every day | ORAL | Status: DC
  Administered 2018-09-24 – 2018-09-29 (×5): 1000 [IU] via ORAL
  Filled 2018-09-24 (×5): qty 1

## 2018-09-24 MED ORDER — CHLORHEXIDINE GLUCONATE CLOTH 2 % EX PADS
6.0000 | MEDICATED_PAD | Freq: Every day | CUTANEOUS | Status: AC
  Administered 2018-09-24 – 2018-09-29 (×5): 6 via TOPICAL

## 2018-09-24 MED ORDER — ACETAMINOPHEN 325 MG PO TABS
650.0000 mg | ORAL_TABLET | Freq: Four times a day (QID) | ORAL | Status: DC | PRN
  Administered 2018-09-26 – 2018-09-29 (×7): 650 mg via ORAL
  Filled 2018-09-24 (×7): qty 2

## 2018-09-24 MED ORDER — TRAMADOL HCL 50 MG PO TABS: 50.0000 mg | ORAL_TABLET | Freq: Three times a day (TID) | ORAL | Status: DC | PRN

## 2018-09-24 SURGICAL SUPPLY — 1 items: IMMOB KNEE 24 THIGH 24 443303 (SOFTGOODS) ×2 IMPLANT

## 2018-09-24 NOTE — Plan of Care (Signed)
  Problem: Education: °Goal: Verbalization of understanding the information provided (i.e., activity precautions, restrictions, etc) will improve °Outcome: Progressing °  °Problem: Activity: °Goal: Ability to ambulate and perform ADLs will improve °Outcome: Progressing °  °Problem: Clinical Measurements: °Goal: Postoperative complications will be avoided or minimized °Outcome: Progressing °  °Problem: Self-Concept: °Goal: Ability to maintain and perform role responsibilities to the fullest extent possible will improve °Outcome: Progressing °  °

## 2018-09-24 NOTE — Consult Note (Signed)
ORTHOPAEDIC CONSULTATION  REQUESTING PHYSICIAN: Loletha Grayer, MD  Chief Complaint: left hip pain  HPI: [name] is a 84 y.o. female who complains of left pain that began yesterday. An x-ray was obtained at the rehab center showing a dislocated right total hip. The hip replacement was performed by Dr. Roland Rack after femoral neck fracture. Please see H&P and ED notes for details. Denies any numbness, tingling or constitutional symptoms. Patient has not been ambulatory.  Past Medical History:  Diagnosis Date  . Anemia   . Chronic kidney disease, stage 3 ([place])   . Degenerative joint disease (DJD) of lumbar spine   . Diastolic dysfunction   . Dizziness   . Edema   . ETD (eustachian tube dysfunction)   . Heartburn   . History of rectal bleeding   . HTN (hypertension)   . Hypercalcemia   . Intracranial aneurysm   . Low vision, one eye   . Nephritis and nephropathy   . Osteoporosis   . Pyuria   . Urge incontinence   . Urinary frequency   . Urinary retention    Past Surgical History:  Procedure Laterality Date  . dilation of esophageal web    . ESOPHAGOGASTRODUODENOSCOPY (EGD) WITH PROPOFOL N/A 12/06/2017   Procedure: ESOPHAGOGASTRODUODENOSCOPY (EGD) WITH PROPOFOL;  Surgeon: Lucilla Lame, MD;  Location: [facility] ENDOSCOPY;  Service: Endoscopy;  Laterality: N/A;  . FRACTURE SURGERY Left    femur  . IR GENERIC HISTORICAL  08/05/2016   IR CATHETER TUBE CHANGE 08/05/2016 [name], MD [facility]  . IR GENERIC HISTORICAL  09/17/2016   IR CATHETER TUBE CHANGE 09/17/2016 [facility]  . IR GENERIC HISTORICAL  11/19/2016   IR CATHETER TUBE CHANGE 11/19/2016 [name], MD [facility]  . IR GENERIC HISTORICAL  11/29/2016   IR CATHETER TUBE CHANGE 11/29/2016 [name], DO [facility]  . JOINT REPLACEMENT Bilateral   . TOTAL HIP ARTHROPLASTY WITH HARDWARE REMOVAL Left 07/24/2018   Procedure: TOTAL HIP ARTHROPLASTY WITH HARDWARE REMOVAL-2 screws;  Surgeon: Corky Mull,  MD;  Location: [facility] ORS;  Service: Orthopedics;  Laterality: Left;  picture taken, and placed on chart, of 2 screws that were removed   . TOTAL KNEE ARTHROPLASTY Bilateral   . VAGINAL HYSTERECTOMY  1967   ovaries also removed because of fibroida   Social History   Socioeconomic History  . Marital status: Widowed    Spouse name: Not on file  . Number of children: Not on file  . Years of education: Not on file  . Highest education level: Not on file  Occupational History  . Not on file  Social Needs  . Financial resource strain: Not on file  . Food insecurity:    Worry: Not on file    Inability: Not on file  . Transportation needs:    Medical: Not on file    Non-medical: Not on file  Tobacco Use  . Smoking status: Former Smoker    Last attempt to quit: 07/23/1964    Years since quitting: 54.2  . Smokeless tobacco: Never Used  . Tobacco comment: quit 1965  Substance and Sexual Activity  . Alcohol use: No    Alcohol/week: 0.0 standard drinks  . Drug use: No  . Sexual activity: Not on file  Lifestyle  . Physical activity:    Days per week: Not on file    Minutes per session: Not on file  . Stress: Not on file  Relationships  . Social connections:  Talks on phone: Not on file    Gets together: Not on file    Attends religious service: Not on file    Active member of club or organization: Not on file    Attends meetings of clubs or organizations: Not on file    Relationship status: Not on file  Other Topics Concern  . Not on file  Social History Narrative  . Not on file   Family History  Problem Relation Age of Onset  . Kidney disease Neg Hx   . Bladder Cancer Neg Hx   . Kidney cancer Neg Hx    No Known Allergies Prior to Admission medications   Medication Sig Start Date End Date Taking? Authorizing Provider  acetaminophen (TYLENOL) 500 MG tablet Take 1,000 mg by mouth every 8 (eight) hours as needed for mild pain.    Yes [provider]  amLODipine  (NORVASC) 10 MG tablet Take 10 mg by mouth daily.  12/13/16  Yes [provider]  atenolol (TENORMIN) 25 MG tablet Take 1 tablet (25 mg total) by mouth daily. 06/10/16  Yes Mody, Ulice Bold, MD  bisacodyl (DULCOLAX) 10 MG suppository Place 10 mg rectally daily as needed for moderate constipation.   Yes [provider]  bisacodyl (DULCOLAX) 5 MG EC tablet Take 5-10 mg by mouth daily as needed for moderate constipation.   Yes [provider]  cholecalciferol (VITAMIN D) 1000 units tablet Take 1,000 Units by mouth daily. 11/08/17  Yes [provider]  FEROSUL 325 (65 Fe) MG tablet Take 325 mg by mouth 2 (two) times daily. 11/08/17  Yes [provider]  furosemide (LASIX) 20 MG tablet Take 20 mg by mouth daily. 11/08/17  Yes [provider]  gabapentin (NEURONTIN) 300 MG capsule Take 300 mg by mouth at bedtime.  11/08/17  Yes [provider]  ipratropium-albuterol (DUONEB) 0.5-2.5 (3) MG/3ML SOLN Take 3 mLs by nebulization every 6 (six) hours as needed.    Yes [provider]  losartan (COZAAR) 50 MG tablet Take 1 tablet (50 mg total) by mouth daily. Hold if SBP <110 07/25/18  Yes Fritzi Mandes, MD  Multiple Vitamin (MULTIVITAMIN) tablet Take 1 tablet by mouth daily. 06/10/16  Yes Mody, Ulice Bold, MD  pantoprazole (PROTONIX) 40 MG tablet Take 40 mg by mouth daily.   Yes [provider]  polyethylene glycol (MIRALAX / GLYCOLAX) packet Take 17 g by mouth daily.   Yes [provider]  traMADol (ULTRAM) 50 MG tablet Take 1 tablet (50 mg total) by mouth every 8 (eight) hours as needed for moderate pain. 07/25/18  Yes Fritzi Mandes, MD  amoxicillin-clavulanate (AUGMENTIN) 875-125 MG tablet Take 1 tablet by mouth 2 (two) times daily.    [provider]  oxybutynin (DITROPAN) 5 MG tablet Take 5 mg by mouth as directed. 30 minutes prior to monthly catheter replacemeent 12/04/16   [provider]   Dg Knee Complete 4 Views  Left  Result Date: 09/23/2018 CLINICAL DATA:  Left knee pain. EXAM: LEFT KNEE - COMPLETE 4+ VIEW COMPARISON:  None. FINDINGS: There is a total knee arthroplasty with the components intact. There is also a long lateral femoral sideplate and multiple screws transfixing a prior condylar and supracondylar femur fracture. No complicating features. There is a mildly displaced fracture involving the fibular head and neck. I am also suspicious of there may be a lateral cortical fracture involving the tibia at the same level. CT may be helpful for further evaluation. IMPRESSION: Mildly displaced  fibular head and neck fracture and possible lateral tibial fracture. CT may be helpful for further evaluation. Electronically Signed   By: Marijo Sanes M.D.   On: 09/23/2018 19:42   Dg Hip Port Unilat W Or Wo Pelvis 1 View Left  Result Date: 09/23/2018 CLINICAL DATA:  Postreduction. EXAM: DG HIP (WITH OR WITHOUT PELVIS) 1V PORT LEFT COMPARISON:  LEFT hip radiograph September 23, 2018 at 1723 hours FINDINGS: Unchanged dislocation LEFT hip total arthroplasty with femoral component projecting about the acetabular cup. No fracture deformity. Osteopenia. Advanced RIGHT hip osteoarthrosis. Catheter projects over the pelvis. Soft tissue planes are unchanged. IMPRESSION: Unchanged LEFT hip dislocation, status post LEFT hip total arthroplasty. Electronically Signed   By: Elon Alas M.D.   On: 09/23/2018 19:39   Dg Hip Unilat W Or Wo Pelvis 2-3 Views Left  Result Date: 09/23/2018 CLINICAL DATA:  Left hip dislocation EXAM: DG HIP (WITH OR WITHOUT PELVIS) 2-3V LEFT COMPARISON:  07/24/2018 FINDINGS: Left hip arthroplasty with superior dislocation of the of the femoral head relative to the acetabular component. Associated acetabular protrusio. Moderate degenerative changes of the right hip with mild flattening of the femoral head. Visualized bony pelvis appears intact. IMPRESSION: Left hip arthroplasty with superior dislocation of  the femoral head component relative to the acetabulum, as above. Associated acetabular protrusio. Moderate degenerative changes of the right hip. Electronically Signed   By: Julian Hy M.D.   On: 09/23/2018 18:02    Positive ROS: All other systems have been reviewed and were otherwise negative with the exception of those mentioned in the HPI and as above.  Physical Exam: General: Alert, no acute distress Cardiovascular: No pedal edema Respiratory: No cyanosis, no use of accessory musculature GI: No organomegaly, abdomen is soft and non-tender Skin: No lesions in the area of chief complaint Neurologic: Sensation intact distally Psychiatric: Patient is competent for consent with normal mood and affect Lymphatic: No axillary or cervical lymphadenopathy  MUSCULOSKELETAL: knee tender both medial and lateral, left leg short and internally rotated. Incision is well healed. Compartments soft. Good cap refill. +DF/PF/EHL  Assessment: Left hip dislocation after total hip arthroplasty  Plan: Plan a closed reduction. The prior greater trochanter fracture is again identified as well as increased acetabular inclination. Dr. Rudene Christians requested I perform the closed reduction and patient will then be followed by [facility]. If unsuccessful or unstable she may require open reduction and possible revision arthroplasty at a later date with the primary team.  The diagnosis, risks, benefits and alternatives to treatment are all discussed in detail with the patient. Risks include but are not limited to bleeding, infection, deep vein thrombosis, pulmonary embolism, nerve or vascular injury, fracture, repeat operation, persistent pain, instability, weakness, stiffness and death. She understands and is eager to proceed.     Lovell Sheehan, MD    09/24/2018 7:32 AM

## 2018-09-24 NOTE — Anesthesia Post-op Follow-up Note (Signed)
Anesthesia QCDR form completed.        

## 2018-09-24 NOTE — Op Note (Signed)
PRE-OPERATIVE DIAGNOSIS:  dislocated left hip and periprosthetic tibia fracture  POST-OPERATIVE DIAGNOSIS:  Same  PROCEDURE:  CLOSED MANIPULATION HIP unsuccessful  SURGEON:  Lovell Sheehan, MD   ANESTHESIA:   General  PREOPERATIVE INDICATIONS:  TYQUASIA PANT is a  84 y.o. female with a diagnosis of dislocated left hip and ipsilateral periprosthetic tibia fracture who failed conservative measures and elected for surgical management.    The risks benefits and alternatives were discussed with the patient preoperatively including but not limited to the risks of infection, bleeding, nerve injury, cardiopulmonary complications, the need for revision surgery, among others, and the patient was willing to proceed.  OPERATIVE IMPLANTS: None  OPERATIVE FINDINGS: Left dislocated total hip and perprosthetic left tibia fracture  EBL: None  COMPLICATIONS:   None  OPERATIVE PROCEDURE: The patient underwent satisfactory general anesthesia in the supine position.  The dislocated hip was seen to be short and unstable.  Using manual traction directed distally and anteriorly the hip was unable to be reduced. Fluoroscopy showed the femoral head superior to the acetabular shell and unable to clear the edge of the cup. Distal traction and hip rotation was limited due to the inability to use the knee as a lever. A knee immobilizer was placed. The case was discussed intraoperatively with Dr. Rudene Christians and a decision was made to discontinue further attempts. The definitive treatment plan will be per Dr. Rudene Christians.  The patient was awakened and taken to recovery in good condition.   Elyn Aquas. Harlow Mares

## 2018-09-24 NOTE — Anesthesia Postprocedure Evaluation (Signed)
Anesthesia Post Note  Patient: EMALEA MIX  Procedure(s) Performed: CLOSED MANIPULATION HIP unsuccessful (Left )  Patient location during evaluation: PACU Anesthesia Type: General Level of consciousness: awake and alert Pain management: pain level controlled Vital Signs Assessment: post-procedure vital signs reviewed and stable Respiratory status: spontaneous breathing, nonlabored ventilation, respiratory function stable and patient connected to nasal cannula oxygen Cardiovascular status: blood pressure returned to baseline and stable Postop Assessment: no apparent nausea or vomiting Anesthetic complications: no     Last Vitals:  Vitals:   09/24/18 0941 09/24/18 1006  BP: (!) 163/74 (!) 166/74  Pulse: 71 73  Resp: (!) 24 18  Temp:  36.7 C  SpO2: 100% 100%    Last Pain:  Vitals:   09/24/18 1006  TempSrc: Oral  PainSc:                  Durenda Hurt

## 2018-09-24 NOTE — Progress Notes (Signed)
Patient ID: [name], female   DOB: 08/18/1931, 84 y.o.   MRN: [id number]  [employer] PROGRESS NOTE  [name] OFH:[id number] DOB: 1931/07/08 DOA: 09/23/2018 PCP: Marisa Hua, MD  HPI/Subjective: Patient seen late morning.  Felt okay.  Not much pain in her hip.  Objective: Vitals:   09/24/18 1230 09/24/18 1342  BP: (!) 141/66 (!) 135/59  Pulse: 79 83  Resp:    Temp: 98.7 F (37.1 C) 99.4 F (37.4 C)  SpO2: 100% 100%    Filed Weights   09/23/18 1659  Weight: 56 kg    ROS: Review of Systems  Constitutional: Negative for chills and fever.  Eyes: Negative for blurred vision.  Respiratory: Negative for cough and shortness of breath.   Cardiovascular: Negative for chest pain.  Gastrointestinal: Negative for abdominal pain, constipation, diarrhea, nausea and vomiting.  Genitourinary: Negative for dysuria.  Musculoskeletal: Positive for joint pain.  Neurological: Negative for dizziness and headaches.   Exam: Physical Exam  HENT:  Nose: No mucosal edema.  Mouth/Throat: No oropharyngeal exudate or posterior oropharyngeal edema.  Eyes: Pupils are equal, round, and reactive to light. Conjunctivae, EOM and lids are normal.  Neck: No JVD present. Carotid bruit is not present. No edema present. No thyroid mass and no thyromegaly present.  Cardiovascular: S1 normal and S2 normal. Exam reveals no gallop.  No murmur heard. Pulses:      Dorsalis pedis pulses are 2+ on the right side, and 2+ on the left side.  Respiratory: No respiratory distress. She has no wheezes. She has no rhonchi. She has no rales.  GI: Soft. Bowel sounds are normal. There is no tenderness.  Musculoskeletal:       Right ankle: She exhibits no swelling.       Left ankle: She exhibits no swelling.  Lymphadenopathy:    She has no cervical adenopathy.  Neurological: She is alert. No cranial nerve deficit.  Skin: Skin is warm. Nails show no clubbing.  Stage II heel decubiti on the right.  Looks  also like a stage II heel decubiti in the left but I did not want to move her left leg around too much with her hip dislocation and fractured fibula  Psychiatric: She has a normal mood and affect.      Data Reviewed: Basic Metabolic Panel: Recent Labs  Lab 09/23/18 1702 09/24/18 0341  NA 138 138  K 5.4* 4.1  CL 101 101  CO2 29 29  GLUCOSE 95 111*  BUN 33* 28*  CREATININE 0.97 0.87  CALCIUM 9.4 9.2   CBC: Recent Labs  Lab 09/23/18 1702 09/24/18 0341  WBC 8.3 8.6  NEUTROABS 4.3  --   HGB 9.6* 9.5*  HCT 32.0* 30.9*  MCV 91.4 87.8  PLT 288 256    CBG: Recent Labs  Lab 09/24/18 0729  GLUCAP 95    Recent Results (from the past 240 hour(s))  Surgical pcr screen     Status: Abnormal   Collection Time: 09/24/18  7:35 AM  Result Value Ref Range Status   MRSA, PCR POSITIVE (A) NEGATIVE Final    Comment: RESULT CALLED TO, READ BACK BY AND VERIFIED WITH: [facility] C[facility]BELL AT 5498 09/24/18.PMF    Staphylococcus aureus POSITIVE (A) NEGATIVE Final    Comment: (NOTE) The Xpert SA Assay (FDA approved for NASAL specimens in patients 41 years of age and older), is one component of a comprehensive surveillance program. It is not intended to diagnose infection nor to guide or  monitor treatment. Performed at [facility], [address] [place], [place] [address]      Studies: Ct Knee Left Wo Contrast  Result Date: 09/24/2018 CLINICAL DATA:  Bilateral knee replacements, left knee pain. EXAM: CT OF THE LEFT KNEE WITHOUT CONTRAST TECHNIQUE: Multidetector CT imaging of the LEFT knee was performed according to the standard protocol. Multiplanar CT image reconstructions were also generated. COMPARISON:  None. FINDINGS: Bones/Joint/Cartilage Left total knee arthroplasty. Old healed distal femoral metaphysis fracture. Comminuted and minimally displaced fracture of the proximal fibular metaphysis. Comminuted fracture of proximal tibial metaphysis around the tibial stem  component. No significant displacement or angulation. No aggressive osseous lesion.  Small joint effusion. Ligaments Suboptimally assessed by CT. Muscles and Tendons Mild generalized muscle atrophy. Soft tissue edema along the anterior aspect of proximal tibia with a small hematoma measuring 4.2 x 1.2 cm. Quadriceps tendon and patellar tendon are intact. Soft tissues No fluid collection .  No soft tissue mass. IMPRESSION: 1. Left total knee arthroplasty. Comminuted and minimally displaced fracture of the proximal fibular metaphysis. Comminuted fracture of proximal tibial metaphysis around the tibial stem component. Electronically Signed   By: Kathreen Devoid   On: 09/24/2018 11:43   Dg Knee Complete 4 Views Left  Result Date: 09/23/2018 CLINICAL DATA:  Left knee pain. EXAM: LEFT KNEE - COMPLETE 4+ VIEW COMPARISON:  None. FINDINGS: There is a total knee arthroplasty with the components intact. There is also a long lateral femoral sideplate and multiple screws transfixing a prior condylar and supracondylar femur fracture. No complicating features. There is a mildly displaced fracture involving the fibular head and neck. I am also suspicious of there may be a lateral cortical fracture involving the tibia at the same level. CT may be helpful for further evaluation. IMPRESSION: Mildly displaced fibular head and neck fracture and possible lateral tibial fracture. CT may be helpful for further evaluation. Electronically Signed   By: Marijo Sanes M.D.   On: 09/23/2018 19:42   Dg C-arm 1-60 Min-no Report  Result Date: 09/24/2018 Fluoroscopy was utilized by the requesting physician.  No radiographic interpretation.   Dg Hip Port Unilat W Or Wo Pelvis 1 View Left  Result Date: 09/23/2018 CLINICAL DATA:  Postreduction. EXAM: DG HIP (WITH OR WITHOUT PELVIS) 1V PORT LEFT COMPARISON:  LEFT hip radiograph September 23, 2018 at 1723 hours FINDINGS: Unchanged dislocation LEFT hip total arthroplasty with femoral component  projecting about the acetabular cup. No fracture deformity. Osteopenia. Advanced RIGHT hip osteoarthrosis. Catheter projects over the pelvis. Soft tissue planes are unchanged. IMPRESSION: Unchanged LEFT hip dislocation, status post LEFT hip total arthroplasty. Electronically Signed   By: Elon Alas M.D.   On: 09/23/2018 19:39   Dg Hip Unilat W Or Wo Pelvis 2-3 Views Left  Result Date: 09/23/2018 CLINICAL DATA:  Left hip dislocation EXAM: DG HIP (WITH OR WITHOUT PELVIS) 2-3V LEFT COMPARISON:  07/24/2018 FINDINGS: Left hip arthroplasty with superior dislocation of the of the femoral head relative to the acetabular component. Associated acetabular protrusio. Moderate degenerative changes of the right hip with mild flattening of the femoral head. Visualized bony pelvis appears intact. IMPRESSION: Left hip arthroplasty with superior dislocation of the femoral head component relative to the acetabulum, as above. Associated acetabular protrusio. Moderate degenerative changes of the right hip. Electronically Signed   By: Julian Hy M.D.   On: 09/23/2018 18:02    Scheduled Meds: . acetaminophen  500 mg Oral Q6H  . amLODipine  10 mg Oral Daily  .  atenolol  25 mg Oral Daily  . Chlorhexidine Gluconate Cloth  6 each Topical Q0600  . cholecalciferol  1,000 Units Oral Daily  . docusate sodium  100 mg Oral BID  . [START ON 09/25/2018] enoxaparin (LOVENOX) injection  30 mg Subcutaneous Q24H  . ferrous sulfate  325 mg Oral BID  . furosemide  20 mg Oral Daily  . gabapentin  300 mg Oral QHS  . losartan  50 mg Oral Daily  . multivitamin with minerals  1 tablet Oral Daily  . mupirocin ointment  1 application Nasal BID  . pantoprazole  40 mg Oral Daily  . polyethylene glycol  17 g Oral Daily   Continuous Infusions: . sodium chloride 75 mL/hr at 09/24/18 0718  . [START ON 09/25/2018]  ceFAZolin (ANCEF) IV      Assessment/Plan:  1. Left hip dislocation.  Unsuccessful closed reduction attempt.   Orthopedics to plan a procedure for tomorrow afternoon. 2. Left fibula fracture.  Knee immobilizer 3. Bilateral heel decubitus ulcers.  Will need podiatry as outpatient, heel protectors and local wound care. 4. Hypertension on atenolol and losartan 5. Neuropathy on gabapentin 6. GERD on Protonix  Code Status:     Code Status Orders  (From admission, onward)         Start     Ordered   09/24/18 0010  Full code  Continuous     09/24/18 0009        Code Status History    Date Active Date Inactive Code Status Order ID Comments User Context   07/24/2018 1502 07/25/2018 1855 DNR [id number]  Gladstone Lighter, MD Inpatient   12/24/2017 1556 12/29/2017 2033 Full Code [id number]  Dustin Flock, MD Inpatient   12/24/2017 1417 12/24/2017 1556 DNR [id number]  Dustin Flock, MD ED   12/06/2017 1406 12/06/2017 2006 DNR [id number]  Fritzi Mandes, MD Inpatient   12/03/2017 1506 12/06/2017 1406 Full Code [id number]  Idelle Crouch, MD ED   03/28/2017 1649 03/30/2017 2157 DNR [id number]  Hillary Bow, MD ED   06/08/2016 2040 06/11/2016 2141 Full Code [id number]  Hower, Aaron Mose, MD ED    Advance Directive Documentation     Most Recent Value  Type of Advance Directive  Out of facility DNR (pink MOST or yellow form)  Pre-existing out of facility DNR order (yellow form or pink MOST form)  Yellow form placed in chart (order not valid for inpatient use)  "MOST" Form in Place?  -      Disposition Plan: Probably back to [facility] in two days or so depending on results of procedure tomorrow  Consultants:  Orthopedic surgery  Time spent: 25 minutes  [place]

## 2018-09-24 NOTE — Transfer of Care (Signed)
Immediate Anesthesia Transfer of Care Note  Patient: [name]  Procedure(s) Performed: CLOSED MANIPULATION HIP unsuccessful (Left )  Patient Location: PACU  Anesthesia Type:General  Level of Consciousness: awake and alert   Airway & Oxygen Therapy: Patient Spontanous Breathing  Post-op Assessment: Report given to RN  Post vital signs: Reviewed and stable  Last Vitals:  Vitals Value Taken Time  BP 152/72 09/24/2018  8:56 AM  Temp 36.5 C 09/24/2018  8:56 AM  Pulse 78 09/24/2018  8:56 AM  Resp 19 09/24/2018  8:56 AM  SpO2 100 % 09/24/2018  8:56 AM    Last Pain:  Vitals:   09/24/18 0726  TempSrc: Oral  PainSc:          Complications: No apparent anesthesia complications

## 2018-09-24 NOTE — Progress Notes (Addendum)
Patient is a 84 year old who has had failed closed reduction attempt with proximal tibia periprosthetic fracture.  I talked with Dr. Roland Rack  about the patient and she is a nonambulator. On exam she does have pain with logrolling and pain at the knee with a knee immobilizer on. I think to prevent too much with surgery think it be reasonable to just remove her femoral component and leave the acetabular component in place leave her essentially with a [place] that will allow for easier care and hopefully less pain than if she has a reduction with fears of that may come out again. Hopefully can do that tomorrow afternoon would and it leave plan on leaving the periprosthetic fracture alone and just use the immobilizer and over the next several weeks.

## 2018-09-24 NOTE — Anesthesia Procedure Notes (Signed)
Procedure Name: Intubation Performed by: Lesle Reek, CRNA Pre-anesthesia Checklist: Patient identified, Emergency Drugs available, Suction available, Patient being monitored and Timeout performed Patient Re-evaluated:Patient Re-evaluated prior to induction Oxygen Delivery Method: Circle system utilized Preoxygenation: Pre-oxygenation with 100% oxygen Induction Type: IV induction Laryngoscope Size: Mac and 4 Grade View: Grade I Tube type: Oral Tube size: 6.5 mm Number of attempts: 1 Placement Confirmation: ETT inserted through vocal cords under direct vision,  CO2 detector,  positive ETCO2 and breath sounds checked- equal and bilateral Secured at: 20 cm Tube secured with: Tape

## 2018-09-24 NOTE — Progress Notes (Signed)
Patient went to OR via bed and transport.

## 2018-09-25 ENCOUNTER — Inpatient Hospital Stay: Payer: Medicare Other | Admitting: Anesthesiology

## 2018-09-25 ENCOUNTER — Encounter: Payer: Self-pay | Admitting: Orthopedic Surgery

## 2018-09-25 ENCOUNTER — Encounter
Admission: EM | Disposition: A | Discharge status: Skilled Nursing Facility | Payer: Self-pay | Source: Home / Self Care | Attending: Internal Medicine

## 2018-09-25 ENCOUNTER — Inpatient Hospital Stay: Payer: Medicare Other

## 2018-09-25 ENCOUNTER — Other Ambulatory Visit: Payer: Self-pay

## 2018-09-25 HISTORY — PX: TOTAL HIP REVISION: SHX763

## 2018-09-25 LAB — BASIC METABOLIC PANEL
Anion gap: 8 (ref 5–15)
BUN: 24 mg/dL — ABNORMAL HIGH (ref 8–23)
CO2: 26 mmol/L (ref 22–32)
Calcium: 8.4 mg/dL — ABNORMAL LOW (ref 8.9–10.3)
Chloride: 105 mmol/L (ref 98–111)
Creatinine, Ser: 0.94 mg/dL (ref 0.44–1.00)
GFR calc Af Amer: >60 mL/min (ref >60)
GFR calc non Af Amer: 55 mL/min — ABNORMAL LOW (ref >60)
Glucose, Bld: 95 mg/dL (ref 70–99)
POTASSIUM: 3.6 mmol/L (ref 3.5–5.1)
Sodium: 139 mmol/L (ref 135–145)

## 2018-09-25 LAB — CBC
HCT: 24.7 % — ABNORMAL LOW (ref 36.0–46.0)
Hemoglobin: 7.6 g/dL — ABNORMAL LOW (ref 12.0–15.0)
MCH: 27.1 pg (ref 26.0–34.0)
MCHC: 30.8 g/dL (ref 30.0–36.0)
MCV: 88.2 fL (ref 80.0–100.0)
NRBC: 0 % (ref 0.0–0.2)
Platelets: 229 10*3/uL (ref 150–400)
RBC: 2.8 MIL/uL — ABNORMAL LOW (ref 3.87–5.11)
RDW: 17 % — ABNORMAL HIGH (ref 11.5–15.5)
WBC: 6.2 10*3/uL (ref 4.0–10.5)

## 2018-09-25 LAB — GLUCOSE, CAPILLARY
GLUCOSE-CAPILLARY: 82 mg/dL (ref 70–99)
Glucose-Capillary: 81 mg/dL (ref 70–99)
Glucose-Capillary: 62 mg/dL — ABNORMAL LOW (ref 70–99)
Glucose-Capillary: 108 mg/dL — ABNORMAL HIGH (ref 70–99)
Glucose-Capillary: 250 mg/dL — ABNORMAL HIGH (ref 70–99)

## 2018-09-25 LAB — HEMOGLOBIN: Hemoglobin: 6.2 g/dL — ABNORMAL LOW (ref 12.0–15.0)

## 2018-09-25 LAB — APTT: aPTT: 42 seconds — ABNORMAL HIGH (ref 24–36)

## 2018-09-25 LAB — PREPARE RBC (CROSSMATCH)

## 2018-09-25 LAB — PROTIME-INR
INR: 1.3
Prothrombin Time: 16.1 seconds — ABNORMAL HIGH (ref 11.4–15.2)

## 2018-09-25 SURGERY — TOTAL HIP REVISION
Anesthesia: General | Laterality: Left

## 2018-09-25 MED ORDER — FENTANYL CITRATE (PF) 100 MCG/2ML IJ SOLN
INTRAMUSCULAR | Status: AC
  Administered 2018-09-26: 25 ug via INTRAVENOUS
  Filled 2018-09-25: qty 2

## 2018-09-25 MED ORDER — SODIUM CHLORIDE FLUSH 0.9 % IV SOLN
INTRAVENOUS | Status: AC
  Filled 2018-09-25: qty 10

## 2018-09-25 MED ORDER — GENTAMICIN SULFATE 40 MG/ML IJ SOLN
INTRAMUSCULAR | Status: AC
  Filled 2018-09-25: qty 4

## 2018-09-25 MED ORDER — ONDANSETRON HCL 4 MG/2ML IJ SOLN
INTRAMUSCULAR | Status: DC | PRN
  Administered 2018-09-25: 4 mg via INTRAVENOUS

## 2018-09-25 MED ORDER — DEXTROSE-NACL 5-0.45 % IV SOLN
INTRAVENOUS | Status: DC
  Administered 2018-09-25 (×2): via INTRAVENOUS

## 2018-09-25 MED ORDER — LACTATED RINGERS IV SOLN
INTRAVENOUS | Status: DC | PRN
  Administered 2018-09-25: 18:00:00 via INTRAVENOUS

## 2018-09-25 MED ORDER — EPHEDRINE SULFATE 50 MG/ML IJ SOLN
INTRAMUSCULAR | Status: DC | PRN
  Administered 2018-09-25: 10 mg via INTRAVENOUS

## 2018-09-25 MED ORDER — SODIUM CHLORIDE 0.9% IV SOLUTION: Freq: Once | INTRAVENOUS | Status: DC

## 2018-09-25 MED ORDER — ROCURONIUM BROMIDE 100 MG/10ML IV SOLN
INTRAVENOUS | Status: DC | PRN
  Administered 2018-09-25: 20 mg via INTRAVENOUS

## 2018-09-25 MED ORDER — PROPOFOL 10 MG/ML IV BOLUS
INTRAVENOUS | Status: AC
  Filled 2018-09-25: qty 20

## 2018-09-25 MED ORDER — LIDOCAINE HCL (PF) 2 % IJ SOLN
INTRAMUSCULAR | Status: AC
  Filled 2018-09-25: qty 10

## 2018-09-25 MED ORDER — EPHEDRINE SULFATE 50 MG/ML IJ SOLN
10.0000 mg | Freq: Once | INTRAMUSCULAR | Status: AC
  Administered 2018-09-25: 10 mg via INTRAVENOUS

## 2018-09-25 MED ORDER — LIDOCAINE HCL (CARDIAC) PF 100 MG/5ML IV SOSY
PREFILLED_SYRINGE | INTRAVENOUS | Status: DC | PRN
  Administered 2018-09-25: 60 mg via INTRAVENOUS

## 2018-09-25 MED ORDER — SODIUM CHLORIDE 0.9 % IV SOLN
0.0000 ug/min | INTRAVENOUS | Status: DC
  Administered 2018-09-25: 20 ug/min via INTRAVENOUS
  Filled 2018-09-25: qty 1

## 2018-09-25 MED ORDER — FENTANYL CITRATE (PF) 100 MCG/2ML IJ SOLN
25.0000 ug | INTRAMUSCULAR | Status: DC | PRN
  Administered 2018-09-25 – 2018-09-26 (×2): 25 ug via INTRAVENOUS

## 2018-09-25 MED ORDER — SUGAMMADEX SODIUM 200 MG/2ML IV SOLN
INTRAVENOUS | Status: AC
  Filled 2018-09-25: qty 2

## 2018-09-25 MED ORDER — SUGAMMADEX SODIUM 200 MG/2ML IV SOLN
INTRAVENOUS | Status: DC | PRN
  Administered 2018-09-25: 100 mg via INTRAVENOUS

## 2018-09-25 MED ORDER — PROPOFOL 10 MG/ML IV BOLUS
INTRAVENOUS | Status: DC | PRN
  Administered 2018-09-25: 60 mg via INTRAVENOUS

## 2018-09-25 MED ORDER — SUCCINYLCHOLINE CHLORIDE 20 MG/ML IJ SOLN
INTRAMUSCULAR | Status: DC | PRN
  Administered 2018-09-25: 60 mg via INTRAVENOUS

## 2018-09-25 MED ORDER — FENTANYL CITRATE (PF) 100 MCG/2ML IJ SOLN
INTRAMUSCULAR | Status: AC
  Filled 2018-09-25: qty 2

## 2018-09-25 MED ORDER — PHENYLEPHRINE HCL 10 MG/ML IJ SOLN
INTRAMUSCULAR | Status: DC | PRN
  Administered 2018-09-25 (×2): 100 ug via INTRAVENOUS
  Administered 2018-09-25: 200 ug via INTRAVENOUS
  Administered 2018-09-25 (×3): 100 ug via INTRAVENOUS

## 2018-09-25 MED ORDER — FENTANYL CITRATE (PF) 100 MCG/2ML IJ SOLN
INTRAMUSCULAR | Status: DC | PRN
  Administered 2018-09-25 (×2): 50 ug via INTRAVENOUS

## 2018-09-25 MED ORDER — EPHEDRINE SULFATE 50 MG/ML IJ SOLN
INTRAMUSCULAR | Status: AC
  Administered 2018-09-25: 10 mg via INTRAVENOUS
  Filled 2018-09-25: qty 1

## 2018-09-25 MED ORDER — SODIUM CHLORIDE 0.9 % IV SOLN
INTRAVENOUS | Status: DC | PRN
  Administered 2018-09-25: 30 ug/min via INTRAVENOUS

## 2018-09-25 MED ORDER — ONDANSETRON HCL 4 MG/2ML IJ SOLN
INTRAMUSCULAR | Status: AC
  Filled 2018-09-25: qty 2

## 2018-09-25 MED ORDER — ONDANSETRON HCL 4 MG/2ML IJ SOLN: 4.0000 mg | Freq: Once | INTRAMUSCULAR | Status: DC | PRN

## 2018-09-25 MED ORDER — CEFAZOLIN SODIUM-DEXTROSE 1-4 GM/50ML-% IV SOLN
INTRAVENOUS | Status: AC
  Filled 2018-09-25: qty 50

## 2018-09-25 SURGICAL SUPPLY — 48 items
BLADE SAGITTAL WIDE XTHICK NO (BLADE) IMPLANT
CANISTER SUCT 1200ML W/VALVE (MISCELLANEOUS) ×3 IMPLANT
CANISTER SUCT 3000ML PPV (MISCELLANEOUS) ×6 IMPLANT
CHLORAPREP W/TINT 26ML (MISCELLANEOUS) ×3 IMPLANT
COVER WAND RF STERILE (DRAPES) IMPLANT
DRAPE C-ARMOR (DRAPES) IMPLANT
DRAPE INCISE IOBAN 66X60 STRL (DRAPES) ×6 IMPLANT
DRAPE SHEET LG 3/4 BI-LAMINATE (DRAPES) ×6 IMPLANT
DRAPE TABLE BACK 80X90 (DRAPES) ×3 IMPLANT
ELECT BLADE 6.5 EXT (BLADE) ×3 IMPLANT
ELECT CAUTERY BLADE 6.4 (BLADE) ×3 IMPLANT
ELECT REM PT RETURN 9FT ADLT (ELECTROSURGICAL) ×3
ELECTRODE REM PT RTRN 9FT ADLT (ELECTROSURGICAL) ×1 IMPLANT
GLOVE BIOGEL PI IND STRL 9 (GLOVE) IMPLANT
GLOVE BIOGEL PI INDICATOR 9 (GLOVE)
GLOVE INDICATOR 8.0 STRL GRN (GLOVE) ×3 IMPLANT
GLOVE SURG ORTHO 8.0 STRL STRW (GLOVE) ×3 IMPLANT
GLOVE SURG SYN 9.0  PF PI (GLOVE) ×4
GLOVE SURG SYN 9.0 PF PI (GLOVE) ×2 IMPLANT
GOWN STRL REUS W/ TWL LRG LVL3 (GOWN DISPOSABLE) ×1 IMPLANT
GOWN STRL REUS W/ TWL XL LVL3 (GOWN DISPOSABLE) ×1 IMPLANT
GOWN STRL REUS W/TWL LRG LVL3 (GOWN DISPOSABLE) ×2
GOWN STRL REUS W/TWL XL LVL3 (GOWN DISPOSABLE) ×2
HEMOSTAT SURGICEL 2X3 (HEMOSTASIS) ×6 IMPLANT
HOLDER FOLEY CATH W/STRAP (MISCELLANEOUS) ×3 IMPLANT
HOOD PEEL AWAY FLYTE STAYCOOL (MISCELLANEOUS) IMPLANT
KIT PREVENA INCISION MGT 13 (CANNISTER) ×3 IMPLANT
KIT TURNOVER KIT A (KITS) ×3 IMPLANT
NDL SAFETY ECLIPSE 18X1.5 (NEEDLE) ×1 IMPLANT
NEEDLE HYPO 18GX1.5 SHARP (NEEDLE) ×2
NS IRRIG 1000ML POUR BTL (IV SOLUTION) ×3 IMPLANT
PACK HIP PROSTHESIS (MISCELLANEOUS) ×3 IMPLANT
PULSAVAC PLUS IRRIG FAN TIP (DISPOSABLE)
SOL .9 NS 3000ML IRR  AL (IV SOLUTION) ×2
SOL .9 NS 3000ML IRR UROMATIC (IV SOLUTION) ×1 IMPLANT
STAPLER SKIN PROX 35W (STAPLE) ×3 IMPLANT
SUT BONE WAX W31G (SUTURE) ×3 IMPLANT
SUT DVC 2 QUILL PDO  T11 36X36 (SUTURE)
SUT DVC 2 QUILL PDO T11 36X36 (SUTURE) IMPLANT
SUT ETHIBOND #5 BRAIDED 30INL (SUTURE) IMPLANT
SUT V-LOC 90 ABS DVC 3-0 CL (SUTURE) ×6 IMPLANT
SUT VIC AB 1 CT1 36 (SUTURE) ×3 IMPLANT
SYR 20CC LL (SYRINGE) ×3 IMPLANT
TIP FAN IRRIG PULSAVAC PLUS (DISPOSABLE) IMPLANT
TIP IRRIG/SUCT HIGH CAPACITY (MISCELLANEOUS) ×3 IMPLANT
TOWEL OR 17X26 4PK STRL BLUE (TOWEL DISPOSABLE) ×3 IMPLANT
TRAY FOLEY MTR SLVR 16FR STAT (SET/KITS/TRAYS/PACK) IMPLANT
WND VAC CANISTER 500ML (MISCELLANEOUS) ×3 IMPLANT

## 2018-09-25 NOTE — NC FL2 (Addendum)
[place] LEVEL OF CARE SCREENING TOOL     IDENTIFICATION  Patient Name: [name] Birthdate: [birth year] Sex: female Admission Date (Current Location): 09/23/2018  [facility] and [place] Number:  [name] ([id number] [facility]) Facility and Address:  [facility], [address] [place], [place] [address]      Provider Number: [id number]  Attending Physician Name and Address:  Loletha Grayer, MD  Relative Name and Phone Number:       Current Level of Care: Hospital Recommended Level of Care: [place] Prior Approval Number:    Date Approved/Denied:   PASRR Number: [id number])  Discharge Plan: SNF    Current Diagnoses: Patient Active Problem List   Diagnosis Date Noted  . Hip dislocation, left ([place]) 09/23/2018  . Closed left hip fracture ([place]) 07/24/2018  . Gastric foreign body   . Stricture and stenosis of esophagus   . Acute encephalopathy 12/03/2017  . Dysphagia 12/03/2017  . Pressure injury of skin 03/30/2017  . Sepsis ([place]) 03/28/2017  . Pressure ulcer 06/09/2016  . Syncope 06/08/2016  . Urinary tract infection 06/08/2016  . Periprosthetic fracture around internal prosthetic left knee joint 05/25/2016  . Fracture of femur, distal, left, closed ([place]) 05/25/2016  . Closed displaced fracture of base of fourth metacarpal bone of right hand 05/25/2016  . Closed displaced fracture of base of fifth metacarpal bone of right hand 05/25/2016  . Traumatic perinephric hematoma 05/09/2016  . SAH (subarachnoid hemorrhage) ([place]) 05/08/2016  . Trauma 05/08/2016  . SDH (subdural hematoma) ([place]) 05/08/2016  . Perinephric hematoma 05/08/2016  . Hypertension 05/08/2016  . Fractures involving multiple body regions 05/08/2016  . CKD (chronic kidney disease) 05/08/2016  . Brain edema ([place]) 05/08/2016  . Acute blood loss anemia 05/08/2016    Orientation RESPIRATION BLADDER Height & Weight     Self, Time, Situation, Place  Normal Incontinent Super-pubic cath.  Weight: 123 lb 7.3 oz (56 kg) Height:  5\' 3"  (160 cm)  BEHAVIORAL SYMPTOMS/MOOD NEUROLOGICAL BOWEL NUTRITION STATUS      Continent Diet(Diet: NPO for surgery to be advanced. )  AMBULATORY STATUS COMMUNICATION OF NEEDS Skin   Extensive Assist Verbally Surgical wounds, PU Stage and Appropriate Care(Unstagable pressure ulcers on both heels. )                       Personal Care Assistance Level of Assistance  Bathing, Feeding, Dressing Bathing Assistance: Limited assistance Feeding assistance: Independent Dressing Assistance: Limited assistance     Functional Limitations Info  Sight, Hearing, Speech Sight Info: Impaired Hearing Info: Adequate Speech Info: Adequate    SPECIAL CARE FACTORS FREQUENCY  PT (By licensed PT), OT (By licensed OT)     PT Frequency: (5) OT Frequency: (5)            Contractures      Additional Factors Info  Code Status, Allergies, Isolation Precautions Code Status Info: (Full Code. ) Allergies Info: (No Known Allergies. )     Isolation Precautions Info: (MRSA Nasal Swab. )     Current Medications (09/25/2018):  This is the current hospital active medication list Current Facility-Administered Medications  Medication Dose Route Frequency Provider Last Rate Last Dose  . 0.9 %  sodium chloride infusion   Intravenous Continuous Lovell Sheehan, MD   Stopped at 09/25/18 1432  . [MAR Hold] acetaminophen (TYLENOL) tablet 650 mg  650 mg Oral Q6H PRN Lovell Sheehan, MD       Or  . [  MAR Hold] acetaminophen (TYLENOL) suppository 650 mg  650 mg Rectal Q6H PRN Lovell Sheehan, MD      . Doug Sou Hold] amLODipine (NORVASC) tablet 10 mg  10 mg Oral Daily Lovell Sheehan, MD   10 mg at 09/25/18 0839  . [MAR Hold] atenolol (TENORMIN) tablet 25 mg  25 mg Oral Daily Lovell Sheehan, MD   25 mg at 09/25/18 7001  . [MAR Hold] bisacodyl (DULCOLAX) EC tablet 5 mg  5 mg Oral Daily PRN Lovell Sheehan, MD      . Doug Sou Hold]  bisacodyl (DULCOLAX) suppository 10 mg  10 mg Rectal Daily PRN Lovell Sheehan, MD      . ceFAZolin (ANCEF) 1-4 GM/50ML-% IVPB           . [MAR Hold] ceFAZolin (ANCEF) IVPB 1 g/50 mL premix  1 g Intravenous Once Hessie Knows, MD   Stopped at 09/25/18 1507  . [MAR Hold] Chlorhexidine Gluconate Cloth 2 % PADS 6 each  6 each Topical Q0600 Loletha Grayer, MD   6 each at 09/24/18 2107  . [MAR Hold] cholecalciferol (VITAMIN D3) tablet 1,000 Units  1,000 Units Oral Daily Lovell Sheehan, MD   1,000 Units at 09/24/18 1034  . dextrose 5 %-0.45 % sodium chloride infusion   Intravenous Continuous Gunnar Fusi, MD      . Doug Sou Hold] docusate sodium (COLACE) capsule 100 mg  100 mg Oral BID Lovell Sheehan, MD   100 mg at 09/24/18 2108  . [MAR Hold] ferrous sulfate tablet 325 mg  325 mg Oral BID Lovell Sheehan, MD   325 mg at 09/24/18 2108  . [MAR Hold] furosemide (LASIX) tablet 20 mg  20 mg Oral Daily Lovell Sheehan, MD   20 mg at 09/25/18 0840  . [MAR Hold] gabapentin (NEURONTIN) capsule 300 mg  300 mg Oral QHS Lovell Sheehan, MD   300 mg at 09/24/18 2108  . [MAR Hold] HYDROcodone-acetaminophen (NORCO/VICODIN) 5-325 MG per tablet 1-2 tablet  1-2 tablet Oral Q4H PRN Lovell Sheehan, MD   1 tablet at 09/25/18 0840  . [MAR Hold] ipratropium-albuterol (DUONEB) 0.5-2.5 (3) MG/3ML nebulizer solution 3 mL  3 mL Nebulization Q6H PRN Lovell Sheehan, MD      . Doug Sou Hold] losartan (COZAAR) tablet 50 mg  50 mg Oral Daily Lovell Sheehan, MD   50 mg at 09/25/18 0839  . [MAR Hold] metoCLOPramide (REGLAN) tablet 5-10 mg  5-10 mg Oral Q8H PRN Lovell Sheehan, MD       Or  . Doug Sou Hold] metoCLOPramide (REGLAN) injection 5-10 mg  5-10 mg Intravenous Q8H PRN Lovell Sheehan, MD      . Doug Sou Hold] morphine 2 MG/ML injection 0.5-1 mg  0.5-1 mg Intravenous Q2H PRN Lovell Sheehan, MD      . Doug Sou Hold] multivitamin with minerals tablet 1 tablet  1 tablet Oral Daily Lovell Sheehan, MD      . Doug Sou Hold] mupirocin ointment  (BACTROBAN) 2 % 1 application  1 application Nasal BID Loletha Grayer, MD   1 application at 74/94/49 0840  . [MAR Hold] ondansetron (ZOFRAN) tablet 4 mg  4 mg Oral Q6H PRN Lovell Sheehan, MD       Or  . Doug Sou Hold] ondansetron [facility]) injection 4 mg  4 mg Intravenous Q6H PRN Lovell Sheehan, MD      . Doug Sou Hold] pantoprazole (PROTONIX) EC tablet 40 mg  40  mg Oral Daily Lovell Sheehan, MD   40 mg at 09/24/18 1034  . [MAR Hold] polyethylene glycol (MIRALAX / GLYCOLAX) packet 17 g  17 g Oral Daily Lovell Sheehan, MD      . Doug Sou Hold] traMADol Veatrice Bourbon) tablet 50 mg  50 mg Oral Q8H PRN Lovell Sheehan, MD      . Doug Sou Hold] traZODone (DESYREL) tablet 25 mg  25 mg Oral QHS PRN Lovell Sheehan, MD         Discharge Medications: Please see discharge summary for a list of discharge medications.  Relevant Imaging Results:  Relevant Lab Results:   Additional Information (SSN: [id number])  Cicily Bonano, Veronia Beets, LCSW

## 2018-09-25 NOTE — Progress Notes (Signed)
Patient ID: [name], female   DOB: 19-Mar-1931, 84 y.o.   MRN: [id number]  [employer] PROGRESS NOTE  [name]:[id number] DOB: [birth year] DOA: 09/23/2018 PCP: Marisa Hua, MD  HPI/Subjective: Patient feels okay.  Not having any pain in her hip.  She states most of her issues are secondary to her heels.  Objective: Vitals:   09/25/18 0329 09/25/18 0756  BP: 118/60 (!) 120/56  Pulse: 77 77  Resp: 15 18  Temp: 98.8 F (37.1 C) 99.3 F (37.4 C)  SpO2: 100% 100%    Filed Weights   09/23/18 1659  Weight: 56 kg    ROS: Review of Systems  Constitutional: Negative for chills and fever.  Eyes: Negative for blurred vision.  Respiratory: Negative for cough and shortness of breath.   Cardiovascular: Negative for chest pain.  Gastrointestinal: Negative for abdominal pain, constipation, diarrhea, nausea and vomiting.  Genitourinary: Negative for dysuria.  Musculoskeletal: Negative for joint pain.  Neurological: Negative for dizziness and headaches.   Exam: Physical Exam  HENT:  Nose: No mucosal edema.  Mouth/Throat: No oropharyngeal exudate or posterior oropharyngeal edema.  Eyes: Pupils are equal, round, and reactive to light. Conjunctivae, EOM and lids are normal.  Neck: No JVD present. Carotid bruit is not present. No edema present. No thyroid mass and no thyromegaly present.  Cardiovascular: S1 normal and S2 normal. Exam reveals no gallop.  No murmur heard. Pulses:      Dorsalis pedis pulses are 2+ on the right side, and 2+ on the left side.  Respiratory: No respiratory distress. She has no wheezes. She has no rhonchi. She has no rales.  GI: Soft. Bowel sounds are normal. There is no tenderness.  Musculoskeletal:       Right ankle: She exhibits no swelling.       Left ankle: She exhibits no swelling.  Lymphadenopathy:    She has no cervical adenopathy.  Neurological: She is alert. No cranial nerve deficit.  Skin: Skin is warm. Nails show no clubbing.   Stage II heel decubiti on the right.  Looks also like a stage II heel decubiti in the left but I did not want to move her left leg around too much with her hip dislocation and fractured fibula  Psychiatric: She has a normal mood and affect.      Data Reviewed: Basic Metabolic Panel: Recent Labs  Lab 09/23/18 1702 09/24/18 0341 09/25/18 0320  NA 138 138 139  K 5.4* 4.1 3.6  CL 101 101 105  CO2 29 29 26   GLUCOSE 95 111* 95  BUN 33* 28* 24*  CREATININE 0.97 0.87 0.94  CALCIUM 9.4 9.2 8.4*   CBC: Recent Labs  Lab 09/23/18 1702 09/24/18 0341 09/25/18 0320  WBC 8.3 8.6 6.2  NEUTROABS 4.3  --   --   HGB 9.6* 9.5* 7.6*  HCT 32.0* 30.9* 24.7*  MCV 91.4 87.8 88.2  PLT 288 256 229    CBG: Recent Labs  Lab 09/24/18 0729 09/25/18 0753 09/25/18 1213  GLUCAP 95 81 82    Recent Results (from the past 240 hour(s))  Surgical pcr screen     Status: Abnormal   Collection Time: 09/24/18  7:35 AM  Result Value Ref Range Status   MRSA, PCR POSITIVE (A) NEGATIVE Final    Comment: RESULT CALLED TO, READ BACK BY AND VERIFIED WITH: [facility] CAMPBELL AT 9562 09/24/18.PMF    Staphylococcus aureus POSITIVE (A) NEGATIVE Final    Comment: (NOTE) The Xpert  SA Assay (FDA approved for NASAL specimens in patients 87 years of age and older), is one component of a comprehensive surveillance program. It is not intended to diagnose infection nor to guide or monitor treatment. Performed at [facility], [address] [place], [place] [address]      Studies: Ct Knee Left Wo Contrast  Result Date: 09/24/2018 CLINICAL DATA:  Bilateral knee replacements, left knee pain. EXAM: CT OF THE LEFT KNEE WITHOUT CONTRAST TECHNIQUE: Multidetector CT imaging of the LEFT knee was performed according to the standard protocol. Multiplanar CT image reconstructions were also generated. COMPARISON:  None. FINDINGS: Bones/Joint/Cartilage Left total knee arthroplasty. Old healed distal femoral  metaphysis fracture. Comminuted and minimally displaced fracture of the proximal fibular metaphysis. Comminuted fracture of proximal tibial metaphysis around the tibial stem component. No significant displacement or angulation. No aggressive osseous lesion.  Small joint effusion. Ligaments Suboptimally assessed by CT. Muscles and Tendons Mild generalized muscle atrophy. Soft tissue edema along the anterior aspect of proximal tibia with a small hematoma measuring 4.2 x 1.2 cm. Quadriceps tendon and patellar tendon are intact. Soft tissues No fluid collection .  No soft tissue mass. IMPRESSION: 1. Left total knee arthroplasty. Comminuted and minimally displaced fracture of the proximal fibular metaphysis. Comminuted fracture of proximal tibial metaphysis around the tibial stem component. Electronically Signed   By: Kathreen Devoid   On: 09/24/2018 11:43   Dg Knee Complete 4 Views Left  Result Date: 09/23/2018 CLINICAL DATA:  Left knee pain. EXAM: LEFT KNEE - COMPLETE 4+ VIEW COMPARISON:  None. FINDINGS: There is a total knee arthroplasty with the components intact. There is also a long lateral femoral sideplate and multiple screws transfixing a prior condylar and supracondylar femur fracture. No complicating features. There is a mildly displaced fracture involving the fibular head and neck. I am also suspicious of there may be a lateral cortical fracture involving the tibia at the same level. CT may be helpful for further evaluation. IMPRESSION: Mildly displaced fibular head and neck fracture and possible lateral tibial fracture. CT may be helpful for further evaluation. Electronically Signed   By: Marijo Sanes M.D.   On: 09/23/2018 19:42   Dg C-arm 1-60 Min-no Report  Result Date: 09/24/2018 Fluoroscopy was utilized by the requesting physician.  No radiographic interpretation.   Dg Hip Port Unilat W Or Wo Pelvis 1 View Left  Result Date: 09/23/2018 CLINICAL DATA:  Postreduction. EXAM: DG HIP (WITH OR  WITHOUT PELVIS) 1V PORT LEFT COMPARISON:  LEFT hip radiograph September 23, 2018 at 1723 hours FINDINGS: Unchanged dislocation LEFT hip total arthroplasty with femoral component projecting about the acetabular cup. No fracture deformity. Osteopenia. Advanced RIGHT hip osteoarthrosis. Catheter projects over the pelvis. Soft tissue planes are unchanged. IMPRESSION: Unchanged LEFT hip dislocation, status post LEFT hip total arthroplasty. Electronically Signed   By: Elon Alas M.D.   On: 09/23/2018 19:39   Dg Hip Unilat W Or Wo Pelvis 2-3 Views Left  Result Date: 09/23/2018 CLINICAL DATA:  Left hip dislocation EXAM: DG HIP (WITH OR WITHOUT PELVIS) 2-3V LEFT COMPARISON:  07/24/2018 FINDINGS: Left hip arthroplasty with superior dislocation of the of the femoral head relative to the acetabular component. Associated acetabular protrusio. Moderate degenerative changes of the right hip with mild flattening of the femoral head. Visualized bony pelvis appears intact. IMPRESSION: Left hip arthroplasty with superior dislocation of the femoral head component relative to the acetabulum, as above. Associated acetabular protrusio. Moderate degenerative changes of the right hip. Electronically  Signed   By: Julian Hy M.D.   On: 09/23/2018 18:02    Scheduled Meds: . amLODipine  10 mg Oral Daily  . atenolol  25 mg Oral Daily  . Chlorhexidine Gluconate Cloth  6 each Topical Q0600  . cholecalciferol  1,000 Units Oral Daily  . docusate sodium  100 mg Oral BID  . ferrous sulfate  325 mg Oral BID  . furosemide  20 mg Oral Daily  . gabapentin  300 mg Oral QHS  . losartan  50 mg Oral Daily  . multivitamin with minerals  1 tablet Oral Daily  . mupirocin ointment  1 application Nasal BID  . pantoprazole  40 mg Oral Daily  . polyethylene glycol  17 g Oral Daily   Continuous Infusions: . sodium chloride 75 mL/hr at 09/25/18 0951  .  ceFAZolin (ANCEF) IV      Assessment/Plan:  1. Preoperative consultation.   Left hip dislocation.  No contraindications to procedure at this time. 2. Left fibula fracture.  Knee immobilizer 3. Bilateral heel decubitus ulcers.  Will need podiatry as outpatient, heel protectors and local wound care. 4. Hypertension on atenolol and losartan 5. Neuropathy on gabapentin 6. GERD on Protonix  Code Status:     Code Status Orders  (From admission, onward)         Start     Ordered   09/24/18 0010  Full code  Continuous     09/24/18 0009        Code Status History    Date Active Date Inactive Code Status Order ID Comments User Context   07/24/2018 1502 07/25/2018 1855 DNR [id number]  Gladstone Lighter, MD Inpatient   12/24/2017 1556 12/29/2017 2033 Full Code [id number]  Dustin Flock, MD Inpatient   12/24/2017 1417 12/24/2017 1556 DNR [id number]  Dustin Flock, MD ED   12/06/2017 1406 12/06/2017 2006 DNR [id number]  Fritzi Mandes, MD Inpatient   12/03/2017 1506 12/06/2017 1406 Full Code [id number]  Idelle Crouch, MD ED   03/28/2017 1649 03/30/2017 2157 DNR [id number]  Hillary Bow, MD ED   06/08/2016 2040 06/11/2016 2141 Full Code [id number]  Hower, Aaron Mose, MD ED    Advance Directive Documentation     Most Recent Value  Type of Advance Directive  Out of facility DNR (pink MOST or yellow form)  Pre-existing out of facility DNR order (yellow form or pink MOST form)  Yellow form placed in chart (order not valid for inpatient use)  "MOST" Form in Place?  -      Disposition Plan: Probably back to [facility] in a few days  Consultants:  Orthopedic surgery  Time spent: 25 minutes  [place]

## 2018-09-25 NOTE — Progress Notes (Signed)
Plan removal of femoral component to prevent muscle and skin erosion, converting to [place] hip.

## 2018-09-25 NOTE — Clinical Social Work Note (Signed)
Clinical Social Work Assessment  Patient Details  Name: [name] MRN: [id number] Date of Birth: 1931-09-28  Date of referral:  09/25/18               Reason for consult:  Other (Comment Required)(From [facility]  )                Permission sought to share information with:  Facility Art therapist granted to share information::  Yes, Verbal Permission Granted  Name::      [facility]   Agency::     Relationship::     Contact Information:     Housing/Transportation Living arrangements for the past 2 months:  [place] of Information:  Patient, Siblings, Facility Patient Interpreter Needed:  None Criminal Activity/Legal Involvement Pertinent to Current Situation/Hospitalization:  No - Comment as needed Significant Relationships:  Siblings Lives with:  Facility Resident Do you feel safe going back to the place where you live?  Yes Need for family participation in patient care:  Yes (Comment)  Care giving concerns:  Patient is a long term care SNF resident at [facility].    Social Worker assessment / plan:  Holiday representative ([place]) reviewed chart and noted that patient is from [place]. Per [facility] admissions coordinator at [facility] patient is a long term care SNF resident and can return when medically stable. CSW met with patient alone at bedside to discuss D/C plan. Patient is agreeable to D/C back to [facility] and reported that her sister [name] is her HPOA. CSW contacted patient's sister [name] who confirmed that above information. FL2 complete. CSW will continue to follow and assist as needed.     Employment status:  Retired Nurse, adult, Medicaid In [place] PT Recommendations:  Not assessed at this time [place] / Referral to community resources:  [place] [facility](Patient is from [place] )  Patient/Family's Response to care:  Patient and her sister [name] are agreeable for  patient to D/C back to [facility].   Patient/Family's Understanding of and Emotional Response to Diagnosis, Current Treatment, and Prognosis:  Patient and her sister were very pleasant and thanked CSW for assistance.   Emotional Assessment Appearance:  Appears stated age Attitude/Demeanor/Rapport:    Affect (typically observed):  Accepting, Adaptable, Pleasant Orientation:  Oriented to Self, Oriented to Place, Oriented to  Time, Oriented to Situation Alcohol / Substance use:  Not Applicable Psych involvement (Current and /or in the community):  No (Comment)  Discharge Needs  Concerns to be addressed:  Discharge Planning Concerns Readmission within the last 30 days:  No Current discharge risk:  Dependent with Mobility Barriers to Discharge:  Continued Medical Work up   [employer], Veronia Beets, LCSW 09/25/2018, 4:12 PM

## 2018-09-25 NOTE — Anesthesia Post-op Follow-up Note (Signed)
Anesthesia QCDR form completed.        

## 2018-09-25 NOTE — Anesthesia Procedure Notes (Signed)
Procedure Name: Intubation Date/Time: 09/25/2018 4:26 PM Performed by: Hedda Slade, CRNA Pre-anesthesia Checklist: Patient identified, Patient being monitored, Timeout performed, Emergency Drugs available and Suction available Patient Re-evaluated:Patient Re-evaluated prior to induction Oxygen Delivery Method: Circle system utilized Preoxygenation: Pre-oxygenation with 100% oxygen Induction Type: IV induction Ventilation: Mask ventilation without difficulty Laryngoscope Size: Mac and 3 Grade View: Grade I Tube type: Oral Tube size: 7.0 mm Number of attempts: 1 Airway Equipment and Method: Stylet Placement Confirmation: ETT inserted through vocal cords under direct vision,  positive ETCO2 and breath sounds checked- equal and bilateral Secured at: 21 cm Tube secured with: Tape Dental Injury: Teeth and Oropharynx as per pre-operative assessment

## 2018-09-25 NOTE — Op Note (Signed)
09/25/2018  6:09 PM  PATIENT:  [name]  84 y.o. female  PRE-OPERATIVE DIAGNOSIS:  LEFT TOTAL HIP DISLOCATION  POST-OPERATIVE DIAGNOSIS:  LEFT TOTAL HIP DISLOCATION  PROCEDURE:  Procedure(s): TOTAL HIP REVISION-REMOVING FEMORAL COMPONENT (Left) and acetabular component  SURGEON: Laurene Footman, MD  ASSISTANTS: None  ANESTHESIA:   general  EBL:  Total I/O In: 3140.4 [I.V.:3140.4] Out: 1200 [Urine:300; Blood:900]  BLOOD ADMINISTERED:none  DRAINS: none   LOCAL MEDICATIONS USED:  NONE  SPECIMEN:  No Specimen  DISPOSITION OF SPECIMEN:  N/A  COUNTS:  YES  TOURNIQUET:  * No tourniquets in log *  IMPLANTS: None  DICTATION: .Dragon Dictation patient was brought to the operating room and after adequate general anesthesia was obtained she was placed in a lateral decubitus position with the left hip.  After prepping and draping in the usual sterile manner, appropriate patient identification and timeout procedures were completed.  The proximal half of the prior incision was utilized getting down to the greater trochanter.  The fracture about the knee the leg really cannot be rotated so the posterior approach was utilized and the femoral stem was identified and a retractor placed around this to allow for internal rotation of the prosthesis and the femur without manipulating the knee.  It took some time to get adequate exposure of the neck and head and after this is obtained the thin flexible osteotomes were used to loosen the femoral component when the appropriate extractor was applied the femoral femur came out without much difficulty.  Next the acetabular is checked it appeared to be quite anteverted and was loose on direct pressure and so was removed as well.  The wound was thoroughly irrigated and then bone wax was applied to the acetabular as well as proximal femur to help minimize postop bleeding and the defect in the acetabulum was filled with Gelfoam.  The wound was closed using  a running #1 Vicryl for the deep fascia 2-0 Vicryl subcutaneously and skin staples followed by a Praveena incisional wound VAC  PLAN OF CARE: Continue as inpatient  PATIENT DISPOSITION:  PACU - hemodynamically stable.

## 2018-09-25 NOTE — Transfer of Care (Signed)
Immediate Anesthesia Transfer of Care Note  Patient: [name]  Procedure(s) Performed: TOTAL HIP REVISION-REMOVING FEMORAL COMPONENT (Left )  Patient Location: PACU  Anesthesia Type:General  Level of Consciousness: awake, alert  and oriented  Airway & Oxygen Therapy: Patient Spontanous Breathing and Patient connected to face mask oxygen  Post-op Assessment: Report given to RN and Post -op Vital signs reviewed and stable  Post vital signs: Reviewed and stable  Last Vitals:  Vitals Value Taken Time  BP    Temp 36.9 C 09/25/2018  6:16 PM  Pulse 84 09/25/2018  6:17 PM  Resp 11 09/25/2018  6:17 PM  SpO2 100 % 09/25/2018  6:17 PM  Vitals shown include unvalidated device data.  Last Pain:  Vitals:   09/25/18 1451  TempSrc: Temporal  PainSc: 0-No pain      Patients Stated Pain Goal: 3 (06/89/34 0684)  Complications: No apparent anesthesia complications

## 2018-09-25 NOTE — Anesthesia Preprocedure Evaluation (Signed)
Anesthesia Evaluation  Patient identified by MRN, date of birth, ID band Patient awake    Reviewed: Allergy & Precautions, NPO status , Patient's Chart, lab work & pertinent test results  History of Anesthesia Complications Negative for: history of anesthetic complications  Airway Mallampati: III       Dental   Pulmonary neg sleep apnea, neg COPD, former smoker,           Cardiovascular hypertension, Pt. on medications      Neuro/Psych neg Seizures    GI/Hepatic GERD  Medicated,  Endo/Other  neg diabetes  Renal/GU Renal InsufficiencyRenal disease     Musculoskeletal   Abdominal   Peds  Hematology  (+) anemia ,   Anesthesia Other Findings   Reproductive/Obstetrics                             Anesthesia Physical Anesthesia Plan  ASA: III  Anesthesia Plan:    Post-op Pain Management:    Induction:   PONV Risk Score and Plan: 2 and Dexamethasone and Ondansetron  Airway Management Planned: LMA  Additional Equipment:   Intra-op Plan:   Post-operative Plan:   Informed Consent: I have reviewed the patients History and Physical, chart, labs and discussed the procedure including the risks, benefits and alternatives for the proposed anesthesia with the patient or authorized representative who has indicated his/her understanding and acceptance.     Plan Discussed with:   Anesthesia Plan Comments:         Anesthesia Quick Evaluation

## 2018-09-25 NOTE — Progress Notes (Signed)
Spoke with Dr. Jodell Cipro pt with a 7.6 hemoglobin this morning and for possible surgery this afternoon. MD to place order for PTT and INR.

## 2018-09-25 NOTE — Consult Note (Signed)
[place] Nurse wound consult note Patient receiving care in [facility].  No family present. Reason for Consult: Heel wounds.  Foam heel dressings present at the time of my eval. Wound type: Unstageable PIs to bilateral, posterior heels Pressure Injury POA: Yes Measurement: Right:  4 cm x 6 cm.  Spots of slough present in wound bed (approximately 15%).  Left:  4 cm x 4 cm, also with approximately 10% slough in wound bed.  Remainder of wound beds, pink. Drainage (amount, consistency, odor) Foul odor detected when heel foam dressings peeled down.  Also, heavy brown drainage from both. Periwound: Intact Dressing procedure/placement/frequency: Cleanse bilateral posterior heel wounds with saline. Pat dry.  Cover with Aquacel Ag+ Kellie Simmering [phone]) and wrap in kerlex.  Change daily. Monitor the wound area(s) for worsening of condition such as: Signs/symptoms of infection,  Increase in size,  Development of or worsening of odor, Development of pain, or increased pain at the affected locations.  Notify the medical team if any of these develop.  Thank you for the consult.  Discussed plan of care with the patient and bedside nurse.  [place] nurse will not follow at this time.  Please re-consult the [place] team if needed.  Val Riles, RN, MSN, CWOCN, CNS-BC, pager [phone]

## 2018-09-26 ENCOUNTER — Encounter: Payer: Self-pay | Admitting: Orthopedic Surgery

## 2018-09-26 LAB — BASIC METABOLIC PANEL
Anion gap: 7 (ref 5–15)
BUN: 21 mg/dL (ref 8–23)
CALCIUM: 7.3 mg/dL — AB (ref 8.9–10.3)
CO2: 20 mmol/L — ABNORMAL LOW (ref 22–32)
Chloride: 111 mmol/L (ref 98–111)
Creatinine, Ser: 0.92 mg/dL (ref 0.44–1.00)
GFR calc Af Amer: >60 mL/min (ref >60)
GFR calc non Af Amer: 57 mL/min — ABNORMAL LOW (ref >60)
Glucose, Bld: 137 mg/dL — ABNORMAL HIGH (ref 70–99)
Potassium: 3.9 mmol/L (ref 3.5–5.1)
Sodium: 138 mmol/L (ref 135–145)

## 2018-09-26 LAB — GLUCOSE, CAPILLARY: Glucose-Capillary: 131 mg/dL — ABNORMAL HIGH (ref 70–99)

## 2018-09-26 LAB — CBC
HCT: 24.5 % — ABNORMAL LOW (ref 36.0–46.0)
Hemoglobin: 8 g/dL — ABNORMAL LOW (ref 12.0–15.0)
MCH: 26.2 pg (ref 26.0–34.0)
MCHC: 32.7 g/dL (ref 30.0–36.0)
MCV: 80.3 fL (ref 80.0–100.0)
Platelets: 136 10*3/uL — ABNORMAL LOW (ref 150–400)
RBC: 3.05 MIL/uL — ABNORMAL LOW (ref 3.87–5.11)
RDW: 19.1 % — AB (ref 11.5–15.5)
WBC: 8.5 10*3/uL (ref 4.0–10.5)
nRBC: 0 % (ref 0.0–0.2)

## 2018-09-26 LAB — HEMOGLOBIN: Hemoglobin: 7.8 g/dL — ABNORMAL LOW (ref 12.0–15.0)

## 2018-09-26 MED ORDER — ENOXAPARIN SODIUM 30 MG/0.3ML ~~LOC~~ SOLN: 30.0000 mg | SUBCUTANEOUS | Status: DC

## 2018-09-26 MED ORDER — ONDANSETRON HCL 4 MG/2ML IJ SOLN: 4.0000 mg | Freq: Four times a day (QID) | INTRAMUSCULAR | Status: DC | PRN

## 2018-09-26 MED ORDER — ONDANSETRON HCL 4 MG PO TABS: 4.0000 mg | ORAL_TABLET | Freq: Four times a day (QID) | ORAL | Status: DC | PRN

## 2018-09-26 MED ORDER — ENOXAPARIN SODIUM 40 MG/0.4ML ~~LOC~~ SOLN
40.0000 mg | SUBCUTANEOUS | Status: DC
  Administered 2018-09-27 – 2018-09-29 (×3): 40 mg via SUBCUTANEOUS
  Filled 2018-09-26 (×3): qty 0.4

## 2018-09-26 MED ORDER — AMOXICILLIN-POT CLAVULANATE 875-125 MG PO TABS
1.0000 | ORAL_TABLET | Freq: Two times a day (BID) | ORAL | Status: DC
  Administered 2018-09-26 – 2018-09-27 (×4): 1 via ORAL
  Filled 2018-09-26 (×4): qty 1

## 2018-09-26 MED ORDER — SODIUM CHLORIDE FLUSH 0.9 % IV SOLN
INTRAVENOUS | Status: AC
  Filled 2018-09-26: qty 10

## 2018-09-26 MED ORDER — SODIUM CHLORIDE 0.9 % IV SOLN
INTRAVENOUS | Status: DC | PRN
  Administered 2018-09-26: 1000 mL via INTRAVENOUS
  Administered 2018-09-28 – 2018-09-29 (×2): 250 mL via INTRAVENOUS

## 2018-09-26 MED ORDER — CEFAZOLIN SODIUM-DEXTROSE 1-4 GM/50ML-% IV SOLN
1.0000 g | Freq: Four times a day (QID) | INTRAVENOUS | Status: AC
  Administered 2018-09-26 (×3): 1 g via INTRAVENOUS
  Filled 2018-09-26 (×2): qty 50

## 2018-09-26 MED ORDER — ENOXAPARIN SODIUM 40 MG/0.4ML ~~LOC~~ SOLN: 40.0000 mg | SUBCUTANEOUS | Status: DC

## 2018-09-26 MED ORDER — DOCUSATE SODIUM 100 MG PO CAPS
100.0000 mg | ORAL_CAPSULE | Freq: Two times a day (BID) | ORAL | Status: DC
  Administered 2018-09-26 – 2018-09-29 (×6): 100 mg via ORAL
  Filled 2018-09-26 (×6): qty 1

## 2018-09-26 MED ORDER — SODIUM CHLORIDE 0.9 % IV BOLUS
1000.0000 mL | Freq: Once | INTRAVENOUS | Status: AC
  Administered 2018-09-26: 1000 mL via INTRAVENOUS

## 2018-09-26 MED ORDER — DOXYCYCLINE HYCLATE 100 MG PO TABS
100.0000 mg | ORAL_TABLET | Freq: Two times a day (BID) | ORAL | Status: DC
  Administered 2018-09-26 – 2018-09-29 (×6): 100 mg via ORAL
  Filled 2018-09-26 (×6): qty 1

## 2018-09-26 MED ORDER — CEFAZOLIN SODIUM-DEXTROSE 1-4 GM/50ML-% IV SOLN
INTRAVENOUS | Status: AC
  Administered 2018-09-26: 1 g via INTRAVENOUS
  Filled 2018-09-26: qty 50

## 2018-09-26 NOTE — Progress Notes (Signed)
PHARMACIST - PHYSICIAN COMMUNICATION  CONCERNING:  Enoxaparin (Lovenox) for DVT Prophylaxis    RECOMMENDATION: Patient was prescribed enoxaprin 30mg  q24 hours for VTE prophylaxis.   Filed Weights   09/23/18 1659 09/25/18 1451 09/26/18 0500  Weight: 123 lb 7.3 oz (56 kg) 123 lb 7.3 oz (56 kg) 190 lb (86.2 kg)    Body mass index is 33.66 kg/m.  Estimated Creatinine Clearance: 46.5 mL/min (by C-G formula based on SCr of 0.92 mg/dL).    Patient is candidate for enoxaparin 40mg  every 24 hours based on CrCl >41ml/min  DESCRIPTION:  Pharmacy has adjusted enoxaparin dose per [facility] policy.  Patient is now receiving enoxaparin 40mg  every 24 hours.    Lu Duffel, PharmD, BCPS Clinical Pharmacist 09/26/2018 11:16 AM

## 2018-09-26 NOTE — Progress Notes (Signed)
Patient ID: [name], female   DOB: [birth year], 84 y.o.   MRN: [id number]  [employer] PROGRESS NOTE  [name] JHE:[id number] DOB: 10-Sep-1931 DOA: 09/23/2018 PCP: Marisa Hua, MD  HPI/Subjective: Patient feels okay.  Not having any pain in her hip.  Patient had a drop in hemoglobin after surgery which required blood transfusions.  Objective: Vitals:   09/26/18 1239 09/26/18 1517  BP: (!) 123/56 (!) 109/49  Pulse: 89 81  Resp: 18 20  Temp: 99.6 F (37.6 C) 99.9 F (37.7 C)  SpO2: 100% 100%    Filed Weights   09/23/18 1659 09/25/18 1451 09/26/18 0500  Weight: 56 kg 56 kg 86.2 kg    ROS: Review of Systems  Constitutional: Negative for chills and fever.  Eyes: Negative for blurred vision.  Respiratory: Negative for cough and shortness of breath.   Cardiovascular: Negative for chest pain.  Gastrointestinal: Negative for abdominal pain, constipation, diarrhea, nausea and vomiting.  Genitourinary: Negative for dysuria.  Musculoskeletal: Negative for joint pain.  Neurological: Negative for dizziness and headaches.   Exam: Physical Exam  HENT:  Nose: No mucosal edema.  Mouth/Throat: No oropharyngeal exudate or posterior oropharyngeal edema.  Eyes: Pupils are equal, round, and reactive to light. Conjunctivae, EOM and lids are normal.  Neck: No JVD present. Carotid bruit is not present. No edema present. No thyroid mass and no thyromegaly present.  Cardiovascular: S1 normal and S2 normal. Exam reveals no gallop.  No murmur heard. Pulses:      Dorsalis pedis pulses are 2+ on the right side, and 2+ on the left side.  Respiratory: No respiratory distress. She has no wheezes. She has no rhonchi. She has no rales.  GI: Soft. Bowel sounds are normal. There is no tenderness.  Musculoskeletal:       Right ankle: She exhibits no swelling.       Left ankle: She exhibits no swelling.  Lymphadenopathy:    She has no cervical adenopathy.  Neurological: She is alert. No  cranial nerve deficit.  Skin: Skin is warm. Nails show no clubbing.  Stage II heel decubiti on the right, which is having some greenish drainage and foul-smelling.  Did not look at the one on the left heel since she just had an operation on that left hip.  Psychiatric: She has a normal mood and affect.      Data Reviewed: Basic Metabolic Panel: Recent Labs  Lab 09/23/18 1702 09/24/18 0341 09/25/18 0320 09/26/18 0649  NA 138 138 139 138  K 5.4* 4.1 3.6 3.9  CL 101 101 105 111  CO2 29 29 26  20*  GLUCOSE 95 111* 95 137*  BUN 33* 28* 24* 21  CREATININE 0.97 0.87 0.94 0.92  CALCIUM 9.4 9.2 8.4* 7.3*   CBC: Recent Labs  Lab 09/23/18 1702 09/24/18 0341 09/25/18 0320 09/25/18 1936 09/26/18 0649 09/26/18 0850  WBC 8.3 8.6 6.2  --  8.5  --   NEUTROABS 4.3  --   --   --   --   --   HGB 9.6* 9.5* 7.6* 6.2* 8.0* 7.8*  HCT 32.0* 30.9* 24.7*  --  24.5*  --   MCV 91.4 87.8 88.2  --  80.3  --   PLT 288 256 229  --  136*  --     CBG: Recent Labs  Lab 09/25/18 1213 09/25/18 1452 09/25/18 1547 09/25/18 1818 09/26/18 0733  GLUCAP 82 62* 108* 250* 131*    Recent Results (from the  past 240 hour(s))  Surgical pcr screen     Status: Abnormal   Collection Time: 09/24/18  7:35 AM  Result Value Ref Range Status   MRSA, PCR POSITIVE (A) NEGATIVE Final    Comment: RESULT CALLED TO, READ BACK BY AND VERIFIED WITH: [facility] CAMPBELL AT 1027 09/24/18.PMF    Staphylococcus aureus POSITIVE (A) NEGATIVE Final    Comment: (NOTE) The Xpert SA Assay (FDA approved for NASAL specimens in patients 5 years of age and older), is one component of a comprehensive surveillance program. It is not intended to diagnose infection nor to guide or monitor treatment. Performed at [facility], [address] [place], [place] [address]      Studies: Dg Hip Port Unilat With Pelvis 1v Left  Result Date: 09/25/2018 CLINICAL DATA:  Postoperative hip pain EXAM: DG HIP (WITH OR WITHOUT PELVIS)  1V PORT LEFT COMPARISON:  Two days ago FINDINGS: Left hip arthroplasty has been explanted. There is incongruent hip joint with expected fragmentation and soft tissue gas. The bones are demineralized. There is a partially visualized femoral plate. IMPRESSION: Findings of left hip arthroplasty explant. Electronically Signed   By: Monte Fantasia M.D.   On: 09/25/2018 19:13    Scheduled Meds: . amLODipine  10 mg Oral Daily  . amoxicillin-clavulanate  1 tablet Oral Q12H  . atenolol  25 mg Oral Daily  . Chlorhexidine Gluconate Cloth  6 each Topical Q0600  . cholecalciferol  1,000 Units Oral Daily  . docusate sodium  100 mg Oral BID  . [START ON 09/27/2018] enoxaparin (LOVENOX) injection  40 mg Subcutaneous Q24H  . ferrous sulfate  325 mg Oral BID  . furosemide  20 mg Oral Daily  . gabapentin  300 mg Oral QHS  . losartan  50 mg Oral Daily  . multivitamin with minerals  1 tablet Oral Daily  . mupirocin ointment  1 application Nasal BID  . pantoprazole  40 mg Oral Daily  . polyethylene glycol  17 g Oral Daily   Continuous Infusions: . dextrose 5 % and 0.45% NaCl 50 mL/hr at 09/26/18 0556    Assessment/Plan:  1. Postoperative anemia.  Patient transfused a couple units of packed red blood cells.  Patient has a wound VAC that is draining blood on the left hip.  Continue to monitor hemoglobin.  Transfuse as needed.   2. Left hip dislocation.  Status post procedure yesterday afternoon by Dr. Rudene Christians.  Patient currently has a wound VAC on. 3. Left fibula fracture.  Knee immobilizer 4. Bilateral heel decubitus ulcers.  Right one now with some foul-smelling drainage.  Asked podiatry to see the patient.  Appreciate podiatry consultation.  Started Augmentin and doxycycline. 5. Hypertension on atenolol and losartan 6. Neuropathy on gabapentin 7. GERD on Protonix  Code Status:     Code Status Orders  (From admission, onward)         Start     Ordered   09/24/18 0010  Full code  Continuous      09/24/18 0009        Code Status History    Date Active Date Inactive Code Status Order ID Comments User Context   07/24/2018 1502 07/25/2018 1855 DNR [id number]  Gladstone Lighter, MD Inpatient   12/24/2017 1556 12/29/2017 2033 Full Code [id number]  Dustin Flock, MD Inpatient   12/24/2017 1417 12/24/2017 1556 DNR [id number]  Dustin Flock, MD ED   12/06/2017 1406 12/06/2017 2006 DNR [id number]  Fritzi Mandes, MD Inpatient   12/03/2017  1506 12/06/2017 1406 Full Code [id number]  Idelle Crouch, MD ED   03/28/2017 1649 03/30/2017 2157 DNR [id number]  Hillary Bow, MD ED   06/08/2016 2040 06/11/2016 2141 Full Code [id number]  Hower, Aaron Mose, MD ED    Advance Directive Documentation     Most Recent Value  Type of Advance Directive  Out of facility DNR (pink MOST or yellow form)  Pre-existing out of facility DNR order (yellow form or pink MOST form)  Yellow form placed in chart (order not valid for inpatient use)  "MOST" Form in Place?  -      Disposition Plan: [facility] will [facility] to [facility].  Need to make sure hemoglobin is stable.   Consultants:  Orthopedic surgery  Time spent: 26 minutes  [place]

## 2018-09-26 NOTE — Consult Note (Signed)
[facility] Podiatry                                                      Patient Demographics  [name], is a 84 y.o. female   MRN: [id number]   DOB - 07-Nov-1930  Admit Date - 09/23/2018    Outpatient Primary MD for the patient is Marisa Hua, MD  Consult requested in the Hospital by Loletha Grayer, MD, On 09/26/2018    Reason for consult bilateral decubitus heel ulcers   With History of -  Past Medical History:  Diagnosis Date  . Anemia   . Chronic kidney disease, stage 3 ([place])   . Degenerative joint disease (DJD) of lumbar spine   . Diastolic dysfunction   . Dizziness   . Edema   . ETD (eustachian tube dysfunction)   . Heartburn   . History of rectal bleeding   . HTN (hypertension)   . Hypercalcemia   . Intracranial aneurysm   . Low vision, one eye   . Nephritis and nephropathy   . Osteoporosis   . Pyuria   . Urge incontinence   . Urinary frequency   . Urinary retention       Past Surgical History:  Procedure Laterality Date  . dilation of esophageal web    . ESOPHAGOGASTRODUODENOSCOPY (EGD) WITH PROPOFOL N/A 12/06/2017   Procedure: ESOPHAGOGASTRODUODENOSCOPY (EGD) WITH PROPOFOL;  Surgeon: Lucilla Lame, MD;  Location: [facility] ENDOSCOPY;  Service: Endoscopy;  Laterality: N/A;  . FRACTURE SURGERY Left    femur  . HIP CLOSED REDUCTION Left 09/24/2018   Procedure: CLOSED MANIPULATION HIP unsuccessful;  Surgeon: Lovell Sheehan, MD;  Location: [facility] ORS;  Service: Orthopedics;  Laterality: Left;  . IR GENERIC HISTORICAL  08/05/2016   IR CATHETER TUBE CHANGE 08/05/2016 [name], MD [facility]-INTERV RAD  . IR GENERIC HISTORICAL  09/17/2016   IR CATHETER TUBE CHANGE 09/17/2016 [facility]INTERV RAD  . IR GENERIC HISTORICAL  11/19/2016   IR CATHETER TUBE CHANGE 11/19/2016 [name], MD [facility]INTERV RAD  . IR GENERIC HISTORICAL  11/29/2016   IR  CATHETER TUBE CHANGE 11/29/2016 [name], DO [facility]-INTERV RAD  . JOINT REPLACEMENT Bilateral   . TOTAL HIP ARTHROPLASTY WITH HARDWARE REMOVAL Left 07/24/2018   Procedure: TOTAL HIP ARTHROPLASTY WITH HARDWARE REMOVAL-2 screws;  Surgeon: Corky Mull, MD;  Location: [facility] ORS;  Service: Orthopedics;  Laterality: Left;  picture taken, and placed on chart, of 2 screws that were removed   . TOTAL HIP REVISION Left 09/25/2018   Procedure: TOTAL HIP REVISION-REMOVING FEMORAL COMPONENT;  Surgeon: Hessie Knows, MD;  Location: [facility] ORS;  Service: Orthopedics;  Laterality: Left;  . TOTAL KNEE ARTHROPLASTY Bilateral   . VAGINAL HYSTERECTOMY  1967   ovaries also removed because of fibroida    in for   Chief Complaint  Patient presents with  . Hip Injury     HPI  [name]  is a 85 y.o. female, patient had a longstanding chronic ulcers on both heels she states is been over a year.  She is at [facility] where she states they change the dressing daily.  It has however been about 5 days since she has had the heel dressings changed since she has been admitted to the hospital because of displacement of hip implant.  She underwent surgery  to remove the implant yesterday.  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    Review of Systems    In addition to the HPI above,  No Fever-chills, No Headache, No changes with Vision or hearing, No problems swallowing food or Liquids, No Chest pain, Cough or Shortness of Breath, No Abdominal pain, No Nausea or Vommitting, Bowel movements are regular, No Blood in stool or Urine, No dysuria, No new skin rashes or bruises, No new joints pains-aches,  No new weakness, tingling, numbness in any extremity, No recent weight gain or loss, No polyuria, polydypsia or polyphagia, No significant Mental Stressors.  A full 10 point Review of Systems was done, except as stated above, all other Review of Systems were  negative.   Social History Social History   Tobacco Use  . Smoking status: Former Smoker    Last attempt to quit: 07/23/1964    Years since quitting: 54.2  . Smokeless tobacco: Never Used  . Tobacco comment: quit 1965  Substance Use Topics  . Alcohol use: No    Alcohol/week: 0.0 standard drinks    Family History Family History  Problem Relation Age of Onset  . Kidney disease Neg Hx   . Bladder Cancer Neg Hx   . Kidney cancer Neg Hx     Prior to Admission medications   Medication Sig Start Date End Date Taking? Authorizing Provider  acetaminophen (TYLENOL) 500 MG tablet Take 1,000 mg by mouth every 8 (eight) hours as needed for mild pain.    Yes [provider]  amLODipine (NORVASC) 10 MG tablet Take 10 mg by mouth daily.  12/13/16  Yes [provider]  atenolol (TENORMIN) 25 MG tablet Take 1 tablet (25 mg total) by mouth daily. 06/10/16  Yes Mody, Ulice Bold, MD  bisacodyl (DULCOLAX) 10 MG suppository Place 10 mg rectally daily as needed for moderate constipation.   Yes [provider]  bisacodyl (DULCOLAX) 5 MG EC tablet Take 5-10 mg by mouth daily as needed for moderate constipation.   Yes [provider]  cholecalciferol (VITAMIN D) 1000 units tablet Take 1,000 Units by mouth daily. 11/08/17  Yes [provider]  FEROSUL 325 (65 Fe) MG tablet Take 325 mg by mouth 2 (two) times daily. 11/08/17  Yes [provider]  furosemide (LASIX) 20 MG tablet Take 20 mg by mouth daily. 11/08/17  Yes [provider]  gabapentin (NEURONTIN) 300 MG capsule Take 300 mg by mouth at bedtime.  11/08/17  Yes [provider]  ipratropium-albuterol (DUONEB) 0.5-2.5 (3) MG/3ML SOLN Take 3 mLs by nebulization every 6 (six) hours as needed.    Yes [provider]  losartan (COZAAR) 50 MG tablet Take 1 tablet (50 mg total) by mouth daily. Hold if SBP <110 07/25/18  Yes Fritzi Mandes, MD  Multiple Vitamin (MULTIVITAMIN) tablet Take 1  tablet by mouth daily. 06/10/16  Yes Mody, Ulice Bold, MD  pantoprazole (PROTONIX) 40 MG tablet Take 40 mg by mouth daily.   Yes [provider]  polyethylene glycol (MIRALAX / GLYCOLAX) packet Take 17 g by mouth daily.   Yes [provider]  traMADol (ULTRAM) 50 MG tablet Take 1 tablet (50 mg total) by mouth every 8 (eight) hours as needed for moderate pain. 07/25/18  Yes Fritzi Mandes, MD  amoxicillin-clavulanate (AUGMENTIN) 875-125 MG tablet Take 1 tablet by mouth 2 (two) times daily.    [provider]  oxybutynin (DITROPAN) 5 MG tablet Take 5 mg  by mouth as directed. 30 minutes prior to monthly catheter replacemeent 12/04/16   [provider]    Anti-infectives (From admission, onward)   Start     Dose/Rate Route Frequency Ordered Stop   09/26/18 1200  amoxicillin-clavulanate (AUGMENTIN) 875-125 MG per tablet 1 tablet     1 tablet Oral Every 12 hours 09/26/18 1139     09/26/18 0144  ceFAZolin (ANCEF) IVPB 1 g/50 mL premix     1 g 100 mL/hr over 30 Minutes Intravenous Every 6 hours 09/26/18 0144 09/26/18 1944   09/25/18 1450  ceFAZolin (ANCEF) 1-4 GM/50ML-% IVPB    Note to Pharmacy:  Phineas Real   : cabinet override      09/25/18 1450 09/25/18 1630   09/25/18 0600  ceFAZolin (ANCEF) IVPB 1 g/50 mL premix     1 g 100 mL/hr over 30 Minutes Intravenous  Once 09/24/18 1212 09/25/18 1645      Scheduled Meds: . amLODipine  10 mg Oral Daily  . amoxicillin-clavulanate  1 tablet Oral Q12H  . atenolol  25 mg Oral Daily  . Chlorhexidine Gluconate Cloth  6 each Topical Q0600  . cholecalciferol  1,000 Units Oral Daily  . docusate sodium  100 mg Oral BID  . [START ON 09/27/2018] enoxaparin (LOVENOX) injection  40 mg Subcutaneous Q24H  . ferrous sulfate  325 mg Oral BID  . furosemide  20 mg Oral Daily  . gabapentin  300 mg Oral QHS  . losartan  50 mg Oral Daily  . multivitamin with minerals  1 tablet Oral Daily  . mupirocin ointment  1 application Nasal BID  .  pantoprazole  40 mg Oral Daily  . polyethylene glycol  17 g Oral Daily   Continuous Infusions: .  ceFAZolin (ANCEF) IV 1 g (09/26/18 0954)  . dextrose 5 % and 0.45% NaCl 50 mL/hr at 09/26/18 0556   PRN Meds:.acetaminophen **OR** acetaminophen, bisacodyl, bisacodyl, HYDROcodone-acetaminophen, ipratropium-albuterol, metoCLOPramide **OR** metoCLOPramide (REGLAN) injection, morphine injection, ondansetron **OR** ondansetron (ZOFRAN) IV, traZODone  No Known Allergies  Physical Exam  Vitals  Blood pressure (!) 123/56, pulse 89, temperature 99.6 F (37.6 C), temperature source Oral, resp. rate 18, height 5\' 3"  (1.6 m), weight 86.2 kg, SpO2 100 %.  Lower Extremity exam:  Vascular: Difficult to palpate bilateral  Dermatological: Patient has large decubitus ulcers on both heels.  I removed some dressings that were on their they were highly stained with excessive drainage but it also been for 5 days since they have been changed according to her.  Wounds were each about 4 to 5 cm in diameter with depth through the skin.  They did appear mostly vascular with just a couple of little islands of necrosis.  Mostly granular as far as formation tissue across the regions.  Neurological: Patient is sensate to her feet and legs to a large degree.  Ortho: Limb contracture on the left side with foot internally rotated..She is ambulatory.  Data Review  CBC Recent Labs  Lab 09/23/18 1702 09/24/18 0341 09/25/18 0320 09/25/18 1936 09/26/18 0649 09/26/18 0850  WBC 8.3 8.6 6.2  --  8.5  --   HGB 9.6* 9.5* 7.6* 6.2* 8.0* 7.8*  HCT 32.0* 30.9* 24.7*  --  24.5*  --   PLT 288 256 229  --  136*  --   MCV 91.4 87.8 88.2  --  80.3  --   MCH 27.4 27.0 27.1  --  26.2  --   MCHC 30.0 30.7 30.8  --  32.7  --   RDW 16.9* 16.7* 17.0*  --  19.1*  --   LYMPHSABS 2.8  --   --   --   --   --   MONOABS 0.8  --   --   --   --   --   EOSABS 0.3  --   --   --   --   --   BASOSABS 0.0  --   --   --   --   --     ------------------------------------------------------------------------------------------------------------------  Chemistries  Recent Labs  Lab 09/23/18 1702 09/24/18 0341 09/25/18 0320 09/26/18 0649  NA 138 138 139 138  K 5.4* 4.1 3.6 3.9  CL 101 101 105 111  CO2 29 29 26  20*  GLUCOSE 95 111* 95 137*  BUN 33* 28* 24* 21  CREATININE 0.97 0.87 0.94 0.92  CALCIUM 9.4 9.2 8.4* 7.3*   ------------------------------------------------------------------------------------------------------------------ estimated creatinine clearance is 46.5 mL/min (by C-G formula based on SCr of 0.92 mg/dL). ------------------------------------------------------------------------------------------------------------------ No results for input(s): TSH, T4TOTAL, T3FREE, THYROIDAB in the last 72 hours.  Invalid input(s): FREET3 Urinalysis    Component Value Date/Time   COLORURINE YELLOW (A) 07/24/2018 1133   APPEARANCEUR CLOUDY (A) 07/24/2018 1133   APPEARANCEUR Cloudy (A) 04/23/2016 1451   LABSPEC 1.010 07/24/2018 1133   LABSPEC 1.011 11/25/2011 1204   PHURINE 5.0 07/24/2018 1133   GLUCOSEU NEGATIVE 07/24/2018 1133   GLUCOSEU Negative 11/25/2011 1204   HGBUR MODERATE (A) 07/24/2018 1133   BILIRUBINUR NEGATIVE 07/24/2018 1133   BILIRUBINUR Negative 04/23/2016 1451   BILIRUBINUR Negative 11/25/2011 1204   KETONESUR NEGATIVE 07/24/2018 1133   PROTEINUR 30 (A) 07/24/2018 1133   NITRITE POSITIVE (A) 07/24/2018 1133   LEUKOCYTESUR LARGE (A) 07/24/2018 1133   LEUKOCYTESUR 3+ (A) 04/23/2016 1451   LEUKOCYTESUR Negative 11/25/2011 1204     Imaging results:   Dg Hip Port Unilat With Pelvis 1v Left  Result Date: 09/25/2018 CLINICAL DATA:  Postoperative hip pain EXAM: DG HIP (WITH OR WITHOUT PELVIS) 1V PORT LEFT COMPARISON:  Two days ago FINDINGS: Left hip arthroplasty has been explanted. There is incongruent hip joint with expected fragmentation and soft tissue gas. The bones are demineralized.  There is a partially visualized femoral plate. IMPRESSION: Findings of left hip arthroplasty explant. Electronically Signed   By: Monte Fantasia M.D.   On: 09/25/2018 19:13    Assessment & Plan: She likely has some contamination and may be some wound infection across the region.  The heels look pretty good from overall standpoint as far as being granular and she does get dressing changes routinely at the nursing home she is at.  I do think she needs antibiotics for a while I did a culture on the left heel will see what that grows out but I think we need to try to stabilize and control any infection that may be occurring in the region.  May want to consider an antibiotic for MRSA since her nasal swab was positive but I did do the cultures and we will see what those look like available.  Meanwhile ordered dressing changes daily with wet-to-dry dressing heavy gauze padding and continue use the bootsthe heel protector boots" To additionally take pressure off the regions.  I do not think she needs any urgent surgery on it and would like to see resolution of any infection before we think about any types of grafting materials. Active Problems:   Hip dislocation, left [facility])   Family Communication: Plan discussed  with patient and Albertine Patricia M.D on 09/26/2018 at 1:41 PM  Thank you for the consult, we will follow the patient with you in the Hospital.

## 2018-09-26 NOTE — Care Management Important Message (Signed)
Important Message  Patient Details  Name: SOWMYA PARTRIDGE MRN: [id number] Date of Birth: 1931/08/23   Medicare Important Message Given:  Yes    Marshell Garfinkel, RN 09/26/2018, 4:00 PM

## 2018-09-26 NOTE — Progress Notes (Signed)
   Subjective: 1 Day Post-Op Procedure(s) (LRB): TOTAL HIP REVISION-REMOVING FEMORAL COMPONENT (Left) Patient reports pain as mild.   Patient is well, and has had no acute complaints or problems. Patient received 2 units of PRBC yesterday.  Hemoglobin 7.8 this morning. Denies any CP, SOB, ABD pain.  No dizziness or lightheadedness.   Objective: Vital signs in last 24 hours: Temp:  [97.7 F (36.5 C)-99.2 F (37.3 C)] 97.7 F (36.5 C) (12/10 0734) Pulse Rate:  [62-95] 79 (12/10 0734) Resp:  [10-26] 18 (12/10 0734) BP: (67-150)/(38-80) 118/53 (12/10 0734) SpO2:  [97 %-100 %] 100 % (12/10 0734) Weight:  [56 kg-86.2 kg] 86.2 kg (12/10 0500)  Intake/Output from previous day: 12/09 0701 - 12/10 0700 In: 6356.6 [I.V.:4042.1; Blood:1314.5; IV Piggyback:1000] Out: 2100 [Urine:625; Drains:575; Blood:900] Intake/Output this shift: No intake/output data recorded.  Recent Labs    09/24/18 0341 09/25/18 0320 09/25/18 1936 09/26/18 0649 09/26/18 0850  HGB 9.5* 7.6* 6.2* 8.0* 7.8*   Recent Labs    09/25/18 0320 09/26/18 0649  WBC 6.2 8.5  RBC 2.80* 3.05*  HCT 24.7* 24.5*  PLT 229 136*   Recent Labs    09/25/18 0320 09/26/18 0649  NA 139 138  K 3.6 3.9  CL 105 111  CO2 26 20*  BUN 24* 21  CREATININE 0.94 0.92  GLUCOSE 95 137*  CALCIUM 8.4* 7.3*   Recent Labs    09/25/18 0550  INR 1.30    EXAM General - Patient is Alert, Appropriate and Oriented Left lower extremity - Sensation intact distally Intact pulses distally Dorsiflexion/Plantar flexion intact No cellulitis present Compartment soft  Knee immobilizer intact left lower extremity Dressing - dressing C/D/I and prevenea intact, 400 cc of bloody drainage in canister Motor Function - intact, moving foot and toes well on exam.   Past Medical History:  Diagnosis Date  . Anemia   . Chronic kidney disease, stage 3 ([place])   . Degenerative joint disease (DJD) of lumbar spine   . Diastolic dysfunction   .  Dizziness   . Edema   . ETD (eustachian tube dysfunction)   . Heartburn   . History of rectal bleeding   . HTN (hypertension)   . Hypercalcemia   . Intracranial aneurysm   . Low vision, one eye   . Nephritis and nephropathy   . Osteoporosis   . Pyuria   . Urge incontinence   . Urinary frequency   . Urinary retention     Assessment/Plan:   1 Day Post-Op Procedure(s) (LRB): TOTAL HIP REVISION-REMOVING FEMORAL COMPONENT (Left) Active Problems:   Hip dislocation, left (HCC)   Acute post op blood loss anemia    Estimated body mass index is 33.66 kg/m as calculated from the following:   Height as of this encounter: 5\' 3"  (1.6 m).   Weight as of this encounter: 86.2 kg. Advance diet  Acute post op blood loss anemia -hemoglobin 7.8.  Continue to monitor, recheck labs in the morning Vital signs stable.  Patient asymptomatic. 400 cc output left hip Praveena.  We will continue to monitor. Continue with knee immobilizer left knee. Care management to assist with discharge.   DVT Prophylaxis - Lovenox, TED hose and SCDs   T. Rachelle Hora, PA-C [place] 09/26/2018, 10:02 AM

## 2018-09-26 NOTE — Progress Notes (Signed)
Clinical Education officer, museum (CSW) made [employer] at [employer] aware that patient will need a wound vac on her left hip. Per Liliane Channel he will order a wound vac for patient.  [employer], LCSW [phone]

## 2018-09-26 NOTE — Anesthesia Postprocedure Evaluation (Signed)
Anesthesia Post Note  Patient: [name]  Procedure(s) Performed: TOTAL HIP REVISION-REMOVING FEMORAL COMPONENT (Left )  Patient location during evaluation: PACU Anesthesia Type: General Level of consciousness: awake and alert Pain management: pain level controlled Vital Signs Assessment: post-procedure vital signs reviewed and stable Respiratory status: spontaneous breathing, nonlabored ventilation, respiratory function stable and patient connected to nasal cannula oxygen Cardiovascular status: blood pressure returned to baseline and stable Postop Assessment: no apparent nausea or vomiting Anesthetic complications: no Comments: Patient was initially hemodynamically unstable and required phenylephrine infusion while we further assessed.  Patient did have significant blood loss intraop and post op hemoglobin was in the 6 range, so 2 units of blood were given and patient was hydrated.  Plan was to go to the ICU, but we were able to wean from phenylephrine and patient was stable for the floor.     Last Vitals:  Vitals:   09/26/18 0526 09/26/18 0557  BP: (!) 111/52 130/80  Pulse: 77 86  Resp: 18 14  Temp: 36.6 C 37.3 C  SpO2: 97% 100%    Last Pain:  Vitals:   09/26/18 0600  TempSrc:   PainSc: Asleep                 Martha Clan

## 2018-09-27 ENCOUNTER — Inpatient Hospital Stay: Payer: Medicare Other

## 2018-09-27 LAB — URINALYSIS, COMPLETE (UACMP) WITH MICROSCOPIC
BACTERIA UA: NONE SEEN
Bilirubin Urine: NEGATIVE
Glucose, UA: NEGATIVE mg/dL
Ketones, ur: NEGATIVE mg/dL
Nitrite: NEGATIVE
Protein, ur: 30 mg/dL — AB
Specific Gravity, Urine: 1.013 (ref 1.005–1.030)
pH: 5 (ref 5.0–8.0)

## 2018-09-27 LAB — CBC
HCT: 24.6 % — ABNORMAL LOW (ref 36.0–46.0)
Hemoglobin: 7.6 g/dL — ABNORMAL LOW (ref 12.0–15.0)
MCH: 26 pg (ref 26.0–34.0)
MCHC: 30.9 g/dL (ref 30.0–36.0)
MCV: 84.2 fL (ref 80.0–100.0)
Platelets: 149 10*3/uL — ABNORMAL LOW (ref 150–400)
RBC: 2.92 MIL/uL — ABNORMAL LOW (ref 3.87–5.11)
RDW: 19.3 % — ABNORMAL HIGH (ref 11.5–15.5)
WBC: 9.8 10*3/uL (ref 4.0–10.5)
nRBC: 0 % (ref 0.0–0.2)

## 2018-09-27 LAB — GLUCOSE, CAPILLARY: GLUCOSE-CAPILLARY: 86 mg/dL (ref 70–99)

## 2018-09-27 LAB — HEMOGLOBIN: Hemoglobin: 7.7 g/dL — ABNORMAL LOW (ref 12.0–15.0)

## 2018-09-27 MED ORDER — SODIUM CHLORIDE 0.9 % IV SOLN
1.0000 g | INTRAVENOUS | Status: DC
  Administered 2018-09-27 – 2018-09-29 (×2): 1 g via INTRAVENOUS
  Filled 2018-09-27: qty 1
  Filled 2018-09-27: qty 10
  Filled 2018-09-27: qty 1

## 2018-09-27 NOTE — Progress Notes (Addendum)
Per [facility] admissions coordinator at [place] they have a wound vac for patient. Patient can return to [facility] when medically stable. Per MD patient will not D/C today due to a fever last night. Liliane Channel is aware of above.   [employer], LCSW [phone]

## 2018-09-27 NOTE — Progress Notes (Signed)
   Subjective: 2 Days Post-Op Procedure(s) (LRB): TOTAL HIP REVISION-REMOVING FEMORAL COMPONENT (Left) Patient reports pain as mild.   Patient is well, and has had no acute complaints or problems. Denies any CP, SOB, ABD pain.  No dizziness or lightheadedness.   Objective: Vital signs in last 24 hours: Temp:  [99.4 F (37.4 C)-100.9 F (38.3 C)] 99.4 F (37.4 C) (12/11 0406) Pulse Rate:  [79-99] 86 (12/11 0406) Resp:  [18-20] 19 (12/11 0406) BP: (106-145)/(44-60) 132/60 (12/11 0406) SpO2:  [100 %] 100 % (12/11 0406) Weight:  [77.3 kg] 77.3 kg (12/11 0500)  Intake/Output from previous day: 12/10 0701 - 12/11 0700 In: 997.9 [P.O.:50; I.V.:847.9; IV Piggyback:100] Out: 1000 [Urine:850; Drains:150] Intake/Output this shift: No intake/output data recorded.  Recent Labs    09/25/18 0320 09/25/18 1936 09/26/18 0649 09/26/18 0850 09/27/18 0405  HGB 7.6* 6.2* 8.0* 7.8* 7.7*   Recent Labs    09/25/18 0320 09/26/18 0649  WBC 6.2 8.5  RBC 2.80* 3.05*  HCT 24.7* 24.5*  PLT 229 136*   Recent Labs    09/25/18 0320 09/26/18 0649  NA 139 138  K 3.6 3.9  CL 105 111  CO2 26 20*  BUN 24* 21  CREATININE 0.94 0.92  GLUCOSE 95 137*  CALCIUM 8.4* 7.3*   Recent Labs    09/25/18 0550  INR 1.30    EXAM General - Patient is Alert, Appropriate and Oriented Left lower extremity - Sensation intact distally Intact pulses distally Dorsiflexion/Plantar flexion intact No cellulitis present Compartment soft  Knee immobilizer intact left lower extremity Dressing - dressing C/D/I and prevenea intact, 100 cc of bloody drainage in canister Motor Function - intact, moving foot and toes well on exam.   Past Medical History:  Diagnosis Date  . Anemia   . Chronic kidney disease, stage 3 ([place])   . Degenerative joint disease (DJD) of lumbar spine   . Diastolic dysfunction   . Dizziness   . Edema   . ETD (eustachian tube dysfunction)   . Heartburn   . History of rectal bleeding    . HTN (hypertension)   . Hypercalcemia   . Intracranial aneurysm   . Low vision, one eye   . Nephritis and nephropathy   . Osteoporosis   . Pyuria   . Urge incontinence   . Urinary frequency   . Urinary retention     Assessment/Plan:   2 Days Post-Op Procedure(s) (LRB): TOTAL HIP REVISION-REMOVING FEMORAL COMPONENT (Left) Active Problems:   Hip dislocation, left (HCC)   Acute post op blood loss anemia    Estimated body mass index is 30.19 kg/m as calculated from the following:   Height as of this encounter: 5\' 3"  (1.6 m).   Weight as of this encounter: 77.3 kg. Advance diet  Acute post op blood loss anemia -hemoglobin 7.7, stbale.  Continue with Iron supplement. Vital signs stable.  Patient asymptomatic. 100 cc output left hip Praveena.  We will continue to monitor. Continue with knee immobilizer left knee. Care management to assist with discharge.   Lovenox 40 mg subcu daily x14 days at discharge Remove Praveena on 10/04/2018 and apply honeycomb dressing Follow-up with [facility] on 10/10/2018 for staple removal and Steri-Strip application  DVT Prophylaxis - Lovenox, TED hose and SCDs   T. Rachelle Hora, PA-C [place] 09/27/2018, 8:08 AM

## 2018-09-28 LAB — HEMOGLOBIN: Hemoglobin: 8.6 g/dL — ABNORMAL LOW (ref 12.0–15.0)

## 2018-09-28 LAB — COMPREHENSIVE METABOLIC PANEL
ALT: 6 U/L (ref 0–44)
AST: 15 U/L (ref 15–41)
Albumin: 2 g/dL — ABNORMAL LOW (ref 3.5–5.0)
Alkaline Phosphatase: 65 U/L (ref 38–126)
Anion gap: 7 (ref 5–15)
BUN: 13 mg/dL (ref 8–23)
CO2: 21 mmol/L — ABNORMAL LOW (ref 22–32)
Calcium: 8 mg/dL — ABNORMAL LOW (ref 8.9–10.3)
Chloride: 110 mmol/L (ref 98–111)
Creatinine, Ser: 0.79 mg/dL (ref 0.44–1.00)
GFR calc Af Amer: >60 mL/min (ref >60)
GFR calc non Af Amer: >60 mL/min (ref >60)
Glucose, Bld: 85 mg/dL (ref 70–99)
Potassium: 3.5 mmol/L (ref 3.5–5.1)
Sodium: 138 mmol/L (ref 135–145)
Total Bilirubin: 0.8 mg/dL (ref 0.3–1.2)
Total Protein: 5.6 g/dL — ABNORMAL LOW (ref 6.5–8.1)

## 2018-09-28 LAB — CBC
HCT: 20.9 % — ABNORMAL LOW (ref 36.0–46.0)
Hemoglobin: 6.4 g/dL — ABNORMAL LOW (ref 12.0–15.0)
MCH: 26.4 pg (ref 26.0–34.0)
MCHC: 30.6 g/dL (ref 30.0–36.0)
MCV: 86.4 fL (ref 80.0–100.0)
PLATELETS: 152 10*3/uL (ref 150–400)
RBC: 2.42 MIL/uL — ABNORMAL LOW (ref 3.87–5.11)
RDW: 18.6 % — ABNORMAL HIGH (ref 11.5–15.5)
WBC: 8.3 10*3/uL (ref 4.0–10.5)
nRBC: 0 % (ref 0.0–0.2)

## 2018-09-28 LAB — GLUCOSE, CAPILLARY: Glucose-Capillary: 80 mg/dL (ref 70–99)

## 2018-09-28 LAB — PREPARE RBC (CROSSMATCH)

## 2018-09-28 MED ORDER — SODIUM CHLORIDE 0.9% IV SOLUTION
Freq: Once | INTRAVENOUS | Status: AC
  Administered 2018-09-28: 10 mL/h via INTRAVENOUS

## 2018-09-28 MED ORDER — BISACODYL 10 MG RE SUPP
10.0000 mg | Freq: Every day | RECTAL | Status: DC
  Filled 2018-09-28 (×2): qty 1

## 2018-09-28 NOTE — Care Management Important Message (Signed)
Important Message  Patient Details  Name: [name] MRN: [id number] Date of Birth: Mar 07, 1931   Medicare Important Message Given:  Yes    Susan Bleich A Deserie Dirks, RN 09/28/2018, 11:27 AM

## 2018-09-28 NOTE — Progress Notes (Signed)
Pt refuses I.V. start. Wants to take antibiotic by mouth

## 2018-09-28 NOTE — Progress Notes (Signed)
MD notified of hgb 6.4. no new orders at this time. Will continue to monitor.

## 2018-09-28 NOTE — Care Management (Signed)
Barriers to discharge- anemia requiring blood transfusion and elevated temperature during the night on 09/27/18. Please encourage incentive spirometer use.

## 2018-09-28 NOTE — Progress Notes (Signed)
   Subjective: 3 Days Post-Op Procedure(s) (LRB): TOTAL HIP REVISION-REMOVING FEMORAL COMPONENT (Left) Patient reports pain as mild.   Patient is well, and has had no acute complaints or problems. Denies any CP, SOB, ABD pain.  No dizziness or lightheadedness.   Objective: Vital signs in last 24 hours: Temp:  [98.8 F (37.1 C)-101.8 F (38.8 C)] 98.9 F (37.2 C) (12/12 0759) Pulse Rate:  [78-95] 78 (12/12 0759) Resp:  [18-19] 19 (12/12 0500) BP: (114-130)/(48-62) 126/58 (12/12 0759) SpO2:  [98 %-100 %] 100 % (12/12 0759) Weight:  [88.9 kg] 88.9 kg (12/12 0500)  Intake/Output from previous day: 12/11 0701 - 12/12 0700 In: 569.3 [I.V.:469.3; IV Piggyback:100] Out: 950 [Urine:400; Drains:550] Intake/Output this shift: No intake/output data recorded.  Recent Labs    09/26/18 0649 09/26/18 0850 09/27/18 0405 09/27/18 1035 09/28/18 0414  HGB 8.0* 7.8* 7.7* 7.6* 6.4*   Recent Labs    09/27/18 1035 09/28/18 0414  WBC 9.8 8.3  RBC 2.92* 2.42*  HCT 24.6* 20.9*  PLT 149* 152   Recent Labs    09/26/18 0649 09/28/18 0414  NA 138 138  K 3.9 3.5  CL 111 110  CO2 20* 21*  BUN 21 13  CREATININE 0.92 0.79  GLUCOSE 137* 85  CALCIUM 7.3* 8.0*   No results for input(s): LABPT, INR in the last 72 hours.  EXAM General - Patient is Alert, Appropriate and Oriented Left lower extremity - Sensation intact distally Intact pulses distally Dorsiflexion/Plantar flexion intact No cellulitis present Compartment soft  Knee immobilizer intact left lower extremity Dressing - dressing C/D/I and prevenea intact, minimal bloody drainage in canister Motor Function - intact, moving foot and toes well on exam.   Past Medical History:  Diagnosis Date  . Anemia   . Chronic kidney disease, stage 3 ([place])   . Degenerative joint disease (DJD) of lumbar spine   . Diastolic dysfunction   . Dizziness   . Edema   . ETD (eustachian tube dysfunction)   . Heartburn   . History of rectal  bleeding   . HTN (hypertension)   . Hypercalcemia   . Intracranial aneurysm   . Low vision, one eye   . Nephritis and nephropathy   . Osteoporosis   . Pyuria   . Urge incontinence   . Urinary frequency   . Urinary retention     Assessment/Plan:   3 Days Post-Op Procedure(s) (LRB): TOTAL HIP REVISION-REMOVING FEMORAL COMPONENT (Left) Active Problems:   Hip dislocation, left (HCC)   Acute post op blood loss anemia    Estimated body mass index is 34.72 kg/m as calculated from the following:   Height as of this encounter: 5\' 3"  (1.6 m).   Weight as of this encounter: 88.9 kg. Advance diet  Acute post op blood loss anemia -hemoglobin 6.4. Transfuse 1 unit of PRBC today.  Continue with Iron supplement. Vital signs stable.  Continue with knee immobilizer left knee. Care management to assist with discharge.   Lovenox 40 mg subcu daily x14 days at discharge Remove Praveena on 10/05/2018 and apply honeycomb dressing Follow-up with [facility] on 10/10/2018 for staple removal and Steri-Strip application  DVT Prophylaxis - Lovenox, TED hose and SCDs   T. Rachelle Hora, PA-C [place] 09/28/2018, 9:43 AM

## 2018-09-28 NOTE — Progress Notes (Signed)
Per MD patient is not stable for D/C today. [facility] admissions coordinator at [facility] is aware of above.   [employer], LCSW [phone]

## 2018-09-28 NOTE — Progress Notes (Signed)
Patient ID: [name], female   DOB: 1931-02-10, 84 y.o.   MRN: [id number]  [employer] PROGRESS NOTE  AYLSSA HERRIG AST:[id number] DOB: 1931/06/20 DOA: 09/23/2018 PCP: Marisa Hua, MD  HPI/Subjective: Patient feels okay.  Not having any pain in her hip.  Patient had a drop in hemoglobin after surgery which required blood transfusions.  Had fever last night.  Objective: Vitals:   09/28/18 0500 09/28/18 0759  BP: (!) 117/54 (!) 126/58  Pulse: 80 78  Resp: 19   Temp: 98.9 F (37.2 C) 98.9 F (37.2 C)  SpO2: 100% 100%    Filed Weights   09/26/18 0500 09/27/18 0500 09/28/18 0500  Weight: 86.2 kg 77.3 kg 88.9 kg    ROS: Review of Systems  Constitutional: Negative for chills and fever.  Eyes: Negative for blurred vision.  Respiratory: Negative for cough and shortness of breath.   Cardiovascular: Negative for chest pain.  Gastrointestinal: Negative for abdominal pain, constipation, diarrhea, nausea and vomiting.  Genitourinary: Negative for dysuria.  Musculoskeletal: Negative for joint pain.  Neurological: Negative for dizziness and headaches.   Exam: Physical Exam  HENT:  Nose: No mucosal edema.  Mouth/Throat: No oropharyngeal exudate or posterior oropharyngeal edema.  Eyes: Pupils are equal, round, and reactive to light. Conjunctivae, EOM and lids are normal.  Neck: No JVD present. Carotid bruit is not present. No edema present. No thyroid mass and no thyromegaly present.  Cardiovascular: S1 normal and S2 normal. Exam reveals no gallop.  No murmur heard. Pulses:      Dorsalis pedis pulses are 2+ on the right side and 2+ on the left side.  Respiratory: No respiratory distress. She has no wheezes. She has no rhonchi. She has no rales.  GI: Soft. Bowel sounds are normal. There is no abdominal tenderness.  Musculoskeletal:     Right ankle: She exhibits no swelling.     Left ankle: She exhibits no swelling.  Lymphadenopathy:    She has no cervical adenopathy.   Neurological: She is alert. No cranial nerve deficit.  Skin: Skin is warm. Nails show no clubbing.  Stage II heel decubiti on the right, which is having some greenish drainage and foul-smelling.  Did not look at the one on the left heel since she just had an operation on that left hip.  Psychiatric: She has a normal mood and affect.      Data Reviewed: Basic Metabolic Panel: Recent Labs  Lab 09/23/18 1702 09/24/18 0341 09/25/18 0320 09/26/18 0649 09/28/18 0414  NA 138 138 139 138 138  K 5.4* 4.1 3.6 3.9 3.5  CL 101 101 105 111 110  CO2 29 29 26  20* 21*  GLUCOSE 95 111* 95 137* 85  BUN 33* 28* 24* 21 13  CREATININE 0.97 0.87 0.94 0.92 0.79  CALCIUM 9.4 9.2 8.4* 7.3* 8.0*   CBC: Recent Labs  Lab 09/23/18 1702 09/24/18 0341 09/25/18 0320  09/26/18 0649 09/26/18 0850 09/27/18 0405 09/27/18 1035 09/28/18 0414  WBC 8.3 8.6 6.2  --  8.5  --   --  9.8 8.3  NEUTROABS 4.3  --   --   --   --   --   --   --   --   HGB 9.6* 9.5* 7.6*   < > 8.0* 7.8* 7.7* 7.6* 6.4*  HCT 32.0* 30.9* 24.7*  --  24.5*  --   --  24.6* 20.9*  MCV 91.4 87.8 88.2  --  80.3  --   --  84.2 86.4  PLT 288 256 229  --  136*  --   --  149* 152   < > = values in this interval not displayed.    CBG: Recent Labs  Lab 09/25/18 1547 09/25/18 1818 09/26/18 0733 09/27/18 0840 09/28/18 0800  GLUCAP 108* 250* 131* 86 80    Recent Results (from the past 240 hour(s))  Surgical pcr screen     Status: Abnormal   Collection Time: 09/24/18  7:35 AM  Result Value Ref Range Status   MRSA, PCR POSITIVE (A) NEGATIVE Final    Comment: RESULT CALLED TO, READ BACK BY AND VERIFIED WITH: [facility] AT 4580 09/24/18.PMF    Staphylococcus aureus POSITIVE (A) NEGATIVE Final    Comment: (NOTE) The Xpert SA Assay (FDA approved for NASAL specimens in patients 85 years of age and older), is one component of a comprehensive surveillance program. It is not intended to diagnose infection nor to guide or monitor  treatment. Performed at [facility], [address] [place], [place] [address]   Aerobic/Anaerobic Culture (surgical/deep wound)     Status: None (Preliminary result)   Collection Time: 09/26/18  3:08 PM  Result Value Ref Range Status   Specimen Description   Final    LEG Performed at [facility], [address], [place], [place] [address]    Special Requests   Final    NONE Performed at [facility], [place], [place] [address]    Gram Stain   Final    RARE WBC PRESENT,BOTH PMN AND MONONUCLEAR RARE GRAM POSITIVE COCCI    Culture   Final    TOO YOUNG TO READ Performed at [facility], [place] [address] [place], [place] [address]    Report Status PENDING  Incomplete  CULTURE, BLOOD (ROUTINE X 2) w Reflex to ID Panel     Status: None (Preliminary result)   Collection Time: 09/27/18 10:55 PM  Result Value Ref Range Status   Specimen Description BLOOD BLOOD LEFT WRIST  Final   Special Requests   Final    BOTTLES DRAWN AEROBIC AND ANAEROBIC Blood Culture adequate volume   Culture   Final    NO GROWTH < 12 HOURS Performed at [facility], [address] [place], [place] [address]    Report Status PENDING  Incomplete  CULTURE, BLOOD (ROUTINE X 2) w Reflex to ID Panel     Status: None (Preliminary result)   Collection Time: 09/27/18 10:55 PM  Result Value Ref Range Status   Specimen Description BLOOD LEFT ANTECUBITAL  Final   Special Requests   Final    BOTTLES DRAWN AEROBIC AND ANAEROBIC Blood Culture adequate volume   Culture   Final    NO GROWTH < 12 HOURS Performed at [facility], [address] [place], [place] [address]    Report Status PENDING  Incomplete     Studies: Dg Chest 2 View  Result Date: 09/27/2018 CLINICAL DATA:  Fever. History of hypertension, remote history of smoking. Chronic renal insufficiency stage III EXAM: CHEST - 2 VIEW COMPARISON:  Portable chest x-ray of Mar 02, 2018  FINDINGS: The lungs are adequately inflated and clear. The heart is mildly enlarged but stable. The pulmonary vascularity is normal. The mediastinum is normal in width. There's calcification in the wall of the aortic arch. There is pleural based calcification that appears to be related to the anterior aspect of the left 6 rib which is  stable. IMPRESSION: Mild enlargement of the cardiac silhouette. No acute cardiopulmonary abnormality. Thoracic aortic atherosclerosis. Electronically Signed   By: David  Martinique M.D.   On: 09/27/2018 11:21    Scheduled Meds: . sodium chloride   Intravenous Once  . amLODipine  10 mg Oral Daily  . atenolol  25 mg Oral Daily  . Chlorhexidine Gluconate Cloth  6 each Topical Q0600  . cholecalciferol  1,000 Units Oral Daily  . docusate sodium  100 mg Oral BID  . doxycycline  100 mg Oral Q12H  . enoxaparin (LOVENOX) injection  40 mg Subcutaneous Q24H  . ferrous sulfate  325 mg Oral BID  . furosemide  20 mg Oral Daily  . gabapentin  300 mg Oral QHS  . losartan  50 mg Oral Daily  . multivitamin with minerals  1 tablet Oral Daily  . mupirocin ointment  1 application Nasal BID  . pantoprazole  40 mg Oral Daily  . polyethylene glycol  17 g Oral Daily   Continuous Infusions: . sodium chloride 20 mL/hr at 09/28/18 0511  . cefTRIAXone (ROCEPHIN)  IV Stopped (09/27/18 2332)  . dextrose 5 % and 0.45% NaCl 50 mL/hr at 09/26/18 0556    Assessment/Plan:  1. Postoperative anemia.  Patient transfused a couple units of packed red blood cells.  Patient has a wound VAC that is draining blood on the left hip.  Continue to monitor hemoglobin.  Transfuse as needed.  Ortho advised to cont lovenox. 2. Left hip dislocation.  Status post procedure yesterday afternoon by Dr. Rudene Christians.  Patient currently has a wound VAC on. 3. Fever. Check UA, Xray chest and start on rocephin. Have chronic catheter. Sent bl and urine cx. 4. Left fibula fracture.  Knee immobilizer 5. Bilateral heel decubitus  ulcers.  Right one now with some foul-smelling drainage.  Asked podiatry to see the patient.  Appreciate podiatry consultation.  Started Augmentin and doxycycline. 6. Hypertension on atenolol and losartan 7. Neuropathy on gabapentin 8. GERD on Protonix  Code Status: DNR    Code Status Orders  (From admission, onward)         Start     Ordered   09/24/18 0010  Full code  Continuous     09/24/18 0009        Code Status History    Date Active Date Inactive Code Status Order ID Comments User Context   07/24/2018 1502 07/25/2018 1855 DNR [id number]  Gladstone Lighter, MD Inpatient   12/24/2017 1556 12/29/2017 2033 Full Code [id number]  Dustin Flock, MD Inpatient   12/24/2017 1417 12/24/2017 1556 DNR [id number]  Dustin Flock, MD ED   12/06/2017 1406 12/06/2017 2006 DNR [id number]  Fritzi Mandes, MD Inpatient   12/03/2017 1506 12/06/2017 1406 Full Code [id number]  Idelle Crouch, MD ED   03/28/2017 1649 03/30/2017 2157 DNR [id number]  Hillary Bow, MD ED   06/08/2016 2040 06/11/2016 2141 Full Code [id number]  Hower, Aaron Mose, MD ED    Advance Directive Documentation     Most Recent Value  Type of Advance Directive  Out of facility DNR (pink MOST or yellow form)  Pre-existing out of facility DNR order (yellow form or pink MOST form)  Yellow form placed in chart (order not valid for inpatient use)  "MOST" Form in Place?  -      Disposition Plan: We will go back to  long-term care.  Need to make sure hemoglobin is stable. And no fever for atleast 24 hrs.  Consultants:  Orthopedic  surgery  Time spent: 35 minutes  [employer]

## 2018-09-29 LAB — TYPE AND SCREEN
ABO/RH(D): O POS
Antibody Screen: NEGATIVE
Unit division: 0
Unit division: 0
Unit division: 0

## 2018-09-29 LAB — BPAM RBC
Blood Product Expiration Date: 201912312359
Blood Product Expiration Date: 202001032359
Blood Product Expiration Date: 202001032359
ISSUE DATE / TIME: 201912092011
ISSUE DATE / TIME: 201912092230
ISSUE DATE / TIME: 201912120951
Unit Type and Rh: 5100
Unit Type and Rh: 5100
Unit Type and Rh: 5100

## 2018-09-29 LAB — BASIC METABOLIC PANEL
Anion gap: 6 (ref 5–15)
BUN: 10 mg/dL (ref 8–23)
CO2: 24 mmol/L (ref 22–32)
Calcium: 8.3 mg/dL — ABNORMAL LOW (ref 8.9–10.3)
Chloride: 107 mmol/L (ref 98–111)
Creatinine, Ser: 0.77 mg/dL (ref 0.44–1.00)
GFR calc Af Amer: >60 mL/min (ref >60)
GFR calc non Af Amer: >60 mL/min (ref >60)
Glucose, Bld: 88 mg/dL (ref 70–99)
Potassium: 3.3 mmol/L — ABNORMAL LOW (ref 3.5–5.1)
SODIUM: 137 mmol/L (ref 135–145)

## 2018-09-29 LAB — CBC
HCT: 24.8 % — ABNORMAL LOW (ref 36.0–46.0)
Hemoglobin: 7.9 g/dL — ABNORMAL LOW (ref 12.0–15.0)
MCH: 26.9 pg (ref 26.0–34.0)
MCHC: 31.9 g/dL (ref 30.0–36.0)
MCV: 84.4 fL (ref 80.0–100.0)
NRBC: 0 % (ref 0.0–0.2)
Platelets: 198 10*3/uL (ref 150–400)
RBC: 2.94 MIL/uL — ABNORMAL LOW (ref 3.87–5.11)
RDW: 17.6 % — ABNORMAL HIGH (ref 11.5–15.5)
WBC: 7.1 10*3/uL (ref 4.0–10.5)

## 2018-09-29 LAB — URINE CULTURE: Culture: 50000 — AB

## 2018-09-29 LAB — GLUCOSE, CAPILLARY: Glucose-Capillary: 80 mg/dL (ref 70–99)

## 2018-09-29 MED ORDER — HYDROCODONE-ACETAMINOPHEN 5-325 MG PO TABS: 1.0000 | ORAL_TABLET | Freq: Four times a day (QID) | ORAL | 0 refills | Status: DC | PRN

## 2018-09-29 MED ORDER — FERROUS SULFATE 325 (65 FE) MG PO TABS
325.0000 mg | ORAL_TABLET | Freq: Two times a day (BID) | ORAL | Status: DC
  Administered 2018-09-29: 325 mg via ORAL
  Filled 2018-09-29: qty 1

## 2018-09-29 MED ORDER — ASPIRIN EC 325 MG PO TBEC: 325.0000 mg | DELAYED_RELEASE_TABLET | Freq: Every day | ORAL | 3 refills | Status: AC

## 2018-09-29 MED ORDER — POTASSIUM CHLORIDE CRYS ER 20 MEQ PO TBCR
20.0000 meq | EXTENDED_RELEASE_TABLET | Freq: Two times a day (BID) | ORAL | Status: DC
  Administered 2018-09-29: 20 meq via ORAL
  Filled 2018-09-29: qty 1

## 2018-09-29 MED ORDER — FERROUS SULFATE 325 (65 FE) MG PO TABS: 325.0000 mg | ORAL_TABLET | Freq: Two times a day (BID) | ORAL | Status: DC

## 2018-09-29 MED ORDER — DOCUSATE SODIUM 100 MG PO CAPS: 100.0000 mg | ORAL_CAPSULE | Freq: Two times a day (BID) | ORAL | 0 refills | Status: DC

## 2018-09-29 MED ORDER — CEPHALEXIN 500 MG PO CAPS: 500.0000 mg | ORAL_CAPSULE | Freq: Four times a day (QID) | ORAL | Status: DC

## 2018-09-29 MED ORDER — CEPHALEXIN 500 MG PO CAPS: 500.0000 mg | ORAL_CAPSULE | Freq: Four times a day (QID) | ORAL | 0 refills | Status: AC

## 2018-09-29 NOTE — Progress Notes (Signed)
Contacted IV team to inform them pnt has agreed to IV. Informed and educated pnt that due to fevers over the last 24-48 hrs and need for blood transfusion, IV is very much necessary for antibiotics.  Pnt agrees to comply.

## 2018-09-29 NOTE — Discharge Summary (Signed)
[facility] [place] NAME: [name]    MR#:  [id number]  DATE OF BIRTH:  [birth year]  DATE OF ADMISSION:  09/23/2018 ADMITTING PHYSICIAN: Nance Pear, MD  DATE OF DISCHARGE: 09/29/2018   PRIMARY CARE PHYSICIAN: Marisa Hua, MD    ADMISSION DIAGNOSIS:  Hip dislocation, left, initial encounter ([place]) [S73.005A] Closed fracture of proximal end of left fibula, unspecified fracture morphology, initial encounter [Y65.993T]  DISCHARGE DIAGNOSIS:  Active Problems:   Hip dislocation, left ([place])   SECONDARY DIAGNOSIS:   Past Medical History:  Diagnosis Date  . Anemia   . Chronic kidney disease, stage 3 ([place])   . Degenerative joint disease (DJD) of lumbar spine   . Diastolic dysfunction   . Dizziness   . Edema   . ETD (eustachian tube dysfunction)   . Heartburn   . History of rectal bleeding   . HTN (hypertension)   . Hypercalcemia   . Intracranial aneurysm   . Low vision, one eye   . Nephritis and nephropathy   . Osteoporosis   . Pyuria   . Urge incontinence   . Urinary frequency   . Urinary retention     HOSPITAL COURSE:   1. Postoperative anemia.  Patient transfused a couple units of packed red blood cells.  Patient has a wound VAC that is draining blood on the left hip.  Continue to monitor hemoglobin.  Transfuse as needed.  Ortho advised to cont lovenox.       But pt had continued bleed, so transfused one more unit. And advised to discharge on aspirin 325 mg daily instead. Advise to cont wound vac on d/c 2. Left hip dislocation.  Status post procedure yesterday afternoon by Dr. Rudene Christians.  Patient currently has a wound VAC on. 3. Fever. Check UA, Xray chest and start on rocephin. Have chronic catheter. Sent bl and urine cx. Given rocephin, pt. Improved, no fever in last 24 hrs, cx negative. Change to oral on discharge for 3 more days. 4. Left fibula fracture.  Knee immobilizer 5. Bilateral heel decubitus ulcers.   Right one now with some foul-smelling drainage.  Asked podiatry to see the patient.  Appreciate podiatry consultation.  Started Augmentin and doxycycline. 6. Hypertension on atenolol and losartan 7. Neuropathy on gabapentin 8. GERD on Protonix  DISCHARGE CONDITIONS:   Stable.  CONSULTS OBTAINED:  Treatment Team:  Hessie Knows, MD  DRUG ALLERGIES:  No Known Allergies  DISCHARGE MEDICATIONS:   Allergies as of 09/29/2018   No Known Allergies     Medication List    STOP taking these medications   amoxicillin-clavulanate 875-125 MG tablet Commonly known as:  AUGMENTIN   traMADol 50 MG tablet Commonly known as:  ULTRAM     TAKE these medications   acetaminophen 500 MG tablet Commonly known as:  TYLENOL Take 1,000 mg by mouth every 8 (eight) hours as needed for mild pain.   amLODipine 10 MG tablet Commonly known as:  NORVASC Take 10 mg by mouth daily.   aspirin EC 325 MG tablet Take 1 tablet (325 mg total) by mouth daily.   atenolol 25 MG tablet Commonly known as:  TENORMIN Take 1 tablet (25 mg total) by mouth daily.   bisacodyl 10 MG suppository Commonly known as:  DULCOLAX Place 10 mg rectally daily as needed for moderate constipation.   bisacodyl 5 MG EC tablet Commonly known as:  DULCOLAX Take 5-10 mg by mouth daily as needed for moderate  constipation.   cephALEXin 500 MG capsule Commonly known as:  KEFLEX Take 1 capsule (500 mg total) by mouth every 6 (six) hours for 3 days.   cholecalciferol 25 MCG (1000 UT) tablet Commonly known as:  VITAMIN D Take 1,000 Units by mouth daily.   docusate sodium 100 MG capsule Commonly known as:  COLACE Take 1 capsule (100 mg total) by mouth 2 (two) times daily.   FEROSUL 325 (65 FE) MG tablet Generic drug:  ferrous sulfate Take 325 mg by mouth 2 (two) times daily.   furosemide 20 MG tablet Commonly known as:  LASIX Take 20 mg by mouth daily.   gabapentin 300 MG capsule Commonly known as:  NEURONTIN Take  300 mg by mouth at bedtime.   HYDROcodone-acetaminophen 5-325 MG tablet Commonly known as:  NORCO/VICODIN Take 1-2 tablets by mouth every 6 (six) hours as needed for moderate pain or severe pain.   ipratropium-albuterol 0.5-2.5 (3) MG/3ML Soln Commonly known as:  DUONEB Take 3 mLs by nebulization every 6 (six) hours as needed.   losartan 50 MG tablet Commonly known as:  COZAAR Take 1 tablet (50 mg total) by mouth daily. Hold if SBP <110   multivitamin tablet Take 1 tablet by mouth daily.   oxybutynin 5 MG tablet Commonly known as:  DITROPAN Take 5 mg by mouth as directed. 30 minutes prior to monthly catheter replacemeent   pantoprazole 40 MG tablet Commonly known as:  PROTONIX Take 40 mg by mouth daily.   polyethylene glycol packet Commonly known as:  MIRALAX / GLYCOLAX Take 17 g by mouth daily.        DISCHARGE INSTRUCTIONS:    Follow with Ortho clinic in 1-2 weeks  If you experience worsening of your admission symptoms, develop shortness of breath, life threatening emergency, suicidal or homicidal thoughts you must seek medical attention immediately by calling 911 or calling your MD immediately  if symptoms less severe.  You Must read complete instructions/literature along with all the possible adverse reactions/side effects for all the Medicines you take and that have been prescribed to you. Take any new Medicines after you have completely understood and accept all the possible adverse reactions/side effects.   Please note  You were cared for by a hospitalist during your hospital stay. If you have any questions about your discharge medications or the care you received while you were in the hospital after you are discharged, you can call the unit and asked to speak with the hospitalist on call if the hospitalist that took care of you is not available. Once you are discharged, your primary care physician will handle any further medical issues. Please note that NO REFILLS  for any discharge medications will be authorized once you are discharged, as it is imperative that you return to your primary care physician (or establish a relationship with a primary care physician if you do not have one) for your aftercare needs so that they can reassess your need for medications and monitor your lab values.    Today   CHIEF COMPLAINT:   Chief Complaint  Patient presents with  . Hip Injury    HISTORY OF PRESENT ILLNESS:  [name]  is a 84 y.o. female with a known history of osteoarthritis, osteoporosis, chronic suprapubic catheter that is being changed every month, hypertension, chronic anemia, recent left hip fracture, status post surgical repair 2 months ago. Patient was transferred to emergency room for left hip dislocation noted per x-ray done at her nursing  home facility.  Patient denies any pain and she does not recall any recent fall or trauma. Lower extremity x-ray confirms left hip dislocation, but also reveals mildly displaced fibular head and neck fracture and possible lateral tibial fracture. Blood test done emergency room, including CBC and CMP are grossly unremarkable except for hemoglobin level at 9.6 and potassium at 5.4. EKG shows normal sinus rhythm with heart rate at 71 bpm, old RBBB, no acute ischemic changes. Patient is admitted for further evaluation and treatment.   VITAL SIGNS:  Blood pressure 128/61, pulse 74, temperature 99.1 F (37.3 C), temperature source Oral, resp. rate 16, height 5\' 3"  (1.6 m), weight 90.7 kg, SpO2 99 %.  I/O:    Intake/Output Summary (Last 24 hours) at 09/29/2018 1254 Last data filed at 09/29/2018 0755 Gross per 24 hour  Intake 229.19 ml  Output 1350 ml  Net -1120.81 ml    PHYSICAL EXAMINATION:   HENT:  Nose: No mucosal edema.  Mouth/Throat: No oropharyngeal exudate or posterior oropharyngeal edema.  Eyes: Pupils are equal, round, and reactive to light. Conjunctivae, EOM and lids are normal.  Neck: No  JVD present. Carotid bruit is not present. No edema present. No thyroid mass and no thyromegaly present.  Cardiovascular: S1 normal and S2 normal. Exam reveals no gallop.  No murmur heard. Pulses:      Dorsalis pedis pulses are 2+ on the right side and 2+ on the left side.  Respiratory: No respiratory distress. She has no wheezes. She has no rhonchi. She has no rales.  GI: Soft. Bowel sounds are normal. There is no abdominal tenderness.  Musculoskeletal:     Right ankle: She exhibits no swelling.     Left ankle: She exhibits no swelling.  Lymphadenopathy:    She has no cervical adenopathy.  Neurological: She is alert. No cranial nerve deficit.  Skin: Skin is warm. Nails show no clubbing.  Stage II heel decubiti on the right, which is having some greenish drainage and foul-smelling.  Did not look at the one on the left heel since she just had an operation on that left hip.  Psychiatric: She has a normal mood and affect.   DATA REVIEW:   CBC Recent Labs  Lab 09/29/18 0526  WBC 7.1  HGB 7.9*  HCT 24.8*  PLT 198    Chemistries  Recent Labs  Lab 09/28/18 0414 09/29/18 0526  NA 138 137  K 3.5 3.3*  CL 110 107  CO2 21* 24  GLUCOSE 85 88  BUN 13 10  CREATININE 0.79 0.77  CALCIUM 8.0* 8.3*  AST 15  --   ALT 6  --   ALKPHOS 65  --   BILITOT 0.8  --     Cardiac Enzymes No results for input(s): TROPONINI in the last 168 hours.  Microbiology Results  Results for orders placed or performed during the hospital encounter of 09/23/18  Surgical pcr screen     Status: Abnormal   Collection Time: 09/24/18  7:35 AM  Result Value Ref Range Status   MRSA, PCR POSITIVE (A) NEGATIVE Final    Comment: RESULT CALLED TO, READ BACK BY AND VERIFIED WITH: [facility] C[place]BELL AT 0254 09/24/18.PMF    Staphylococcus aureus POSITIVE (A) NEGATIVE Final    Comment: (NOTE) The Xpert SA Assay (FDA approved for NASAL specimens in patients 75 years of age and older), is one component of a  comprehensive surveillance program. It is not intended to diagnose infection nor to guide or monitor  treatment. Performed at [facility], [address] [place], [place] [address]   Aerobic/Anaerobic Culture (surgical/deep wound)     Status: None (Preliminary result)   Collection Time: 09/26/18  3:08 PM  Result Value Ref Range Status   Specimen Description   Final    LEG Performed at [facility], [address] [place], [place] [address]    Special Requests   Final    NONE Performed at [facility], [address] [place], [place] [address]    Gram Stain   Final    RARE WBC PRESENT,BOTH PMN AND MONONUCLEAR RARE GRAM POSITIVE COCCI Performed at [facility], [place] [address] [place], [place] [address]    Culture   Final    CULTURE REINCUBATED FOR BETTER GROWTH NO ANAEROBES ISOLATED; CULTURE IN PROGRESS FOR 5 DAYS    Report Status PENDING  Incomplete  Urine Culture     Status: Abnormal   Collection Time: 09/27/18 12:25 PM  Result Value Ref Range Status   Specimen Description   Final    URINE, RANDOM Performed at [facility], [address] [place], [place] [address]    Special Requests   Final    NONE Performed at [facility], [address] [place], [place] [address]    Culture (A)  Final    50,000 COLONIES/mL MULTIPLE SPECIES PRESENT, SUGGEST RECOLLECTION   Report Status 09/29/2018 FINAL  Final  CULTURE, BLOOD (ROUTINE X 2) w Reflex to ID Panel     Status: None (Preliminary result)   Collection Time: 09/27/18 10:55 PM  Result Value Ref Range Status   Specimen Description BLOOD BLOOD LEFT WRIST  Final   Special Requests   Final    BOTTLES DRAWN AEROBIC AND ANAEROBIC Blood Culture adequate volume   Culture   Final    NO GROWTH 2 DAYS Performed at [facility], [address] [place],  [address]    Report Status PENDING  Incomplete  CULTURE, BLOOD (ROUTINE X 2) w Reflex to ID Panel      Status: None (Preliminary result)   Collection Time: 09/27/18 10:55 PM  Result Value Ref Range Status   Specimen Description BLOOD LEFT ANTECUBITAL  Final   Special Requests   Final    BOTTLES DRAWN AEROBIC AND ANAEROBIC Blood Culture adequate volume   Culture   Final    NO GROWTH 2 DAYS Performed at [facility], [address], [place],  [address]    Report Status PENDING  Incomplete    RADIOLOGY:  No results found.  EKG:   Orders placed or performed during the hospital encounter of 09/23/18  . EKG 12-Lead  . EKG 12-Lead     Management plans discussed with the patient, family and they are in agreement.  CODE STATUS:     Code Status Orders  (From admission, onward)         Start     Ordered   09/26/18 1246  Do not attempt resuscitation (DNR)  Continuous    Question Answer Comment  In the event of cardiac or respiratory ARREST Do not call a "code blue"   In the event of cardiac or respiratory ARREST Do not perform Intubation, CPR, defibrillation or ACLS   In the event of cardiac or respiratory ARREST Use medication by any route, position, wound care, and other measures to relive pain and suffering. May use oxygen, suction and manual treatment of airway obstruction as needed  for comfort.   Comments nurse may pronounce      09/26/18 1245        Code Status History    Date Active Date Inactive Code Status Order ID Comments User Context   09/24/2018 0009 09/26/2018 1245 Full Code [id number]  Amelia Jo, MD Inpatient   07/24/2018 1502 07/25/2018 1855 DNR [id number]  Gladstone Lighter, MD Inpatient   12/24/2017 1556 12/29/2017 2033 Full Code [id number]  Dustin Flock, MD Inpatient   12/24/2017 1417 12/24/2017 1556 DNR [id number]  Dustin Flock, MD ED   12/06/2017 1406 12/06/2017 2006 DNR [id number]  Fritzi Mandes, MD Inpatient   12/03/2017 1506 12/06/2017 1406 Full Code [id number]  Idelle Crouch, MD ED   03/28/2017 1649 03/30/2017 2157 DNR [id number]  Hillary Bow, MD ED   06/08/2016 2040 06/11/2016 2141 Full Code [id number]  Hower, Aaron Mose, MD ED    Advance Directive Documentation     Most Recent Value  Type of Advance Directive  Out of facility DNR (pink MOST or yellow form)  Pre-existing out of facility DNR order (yellow form or pink MOST form)  Yellow form placed in chart (order not valid for inpatient use)  "MOST" Form in Place?  -      TOTAL TIME TAKING CARE OF THIS PATIENT: 35 minutes.    Vaughan Basta M.D on 09/29/2018 at 12:54 PM  Between 7am to 6pm - Pager - [phone]  After 6pm go to [email or web address] - password [email or web address] [facility]  Office  [phone]  CC: Primary care physician; Marisa Hua, MD   Note: This dictation was prepared with Dragon dictation along with smaller phrase technology. Any transcriptional errors that result from this process are unintentional.

## 2018-09-29 NOTE — Progress Notes (Signed)
EMS arrived to take patient to [facility].  [place] removed and wound vac machine removed for transport.  [facility]. Patient belongings packed up and patient transported via EMS.

## 2018-09-29 NOTE — Progress Notes (Addendum)
Discussed patient discharge with Dr. Anselm Jungling. Also discussed current HGB. Patient from [facility] and will return there.  Discharge packet completed and report called to [place] at [facility]. Dressing changes completed on both heels and around suprapubic catheter. Patient will no complaints of pain.  EMS called for transport. Will remove IVs at their arrival.

## 2018-09-29 NOTE — Progress Notes (Signed)
Patient is medically stable for D/C back to [facility] today. Per [facility] admissions coordinator at [facility] patient can come today to room D-3 and they have a wound vac for patient at the facility. RN will call report and arrange EMS for transport. Clinical Education officer, museum (CSW) sent D/C orders to [employer] [facility] via [employer]. Patient is aware of above. CSW contacted patient's sister [name] and made her aware of above. Please reconsult if future social work needs arise. CSW signing off.   [employer], LCSW [phone]

## 2018-09-29 NOTE — Progress Notes (Addendum)
   Subjective: 4 Days Post-Op Procedure(s) (LRB): TOTAL HIP REVISION-REMOVING FEMORAL COMPONENT (Left) Patient reports pain as mild.   Patient is well, and has had no acute complaints or problems. Denies any CP, SOB, ABD pain.  No dizziness or lightheadedness.   Objective: Vital signs in last 24 hours: Temp:  [98.4 F (36.9 C)-100.4 F (38 C)] 99.1 F (37.3 C) (12/13 0809) Pulse Rate:  [74-89] 74 (12/13 0809) Resp:  [16-18] 16 (12/13 0809) BP: (123-140)/(51-72) 128/61 (12/13 0809) SpO2:  [99 %-100 %] 99 % (12/13 0809) Weight:  [90.7 kg] 90.7 kg (12/13 0500)  Intake/Output from previous day: 12/12 0701 - 12/13 0700 In: 229.2 [I.V.:129.2; IV Piggyback:100] Out: 1150 [Urine:1150] Intake/Output this shift: Total I/O In: -  Out: 200 [Urine:200]  Recent Labs    09/27/18 0405 09/27/18 1035 09/28/18 0414 09/28/18 1757 09/29/18 0526  HGB 7.7* 7.6* 6.4* 8.6* 7.9*   Recent Labs    09/28/18 0414 09/29/18 0526  WBC 8.3 7.1  RBC 2.42* 2.94*  HCT 20.9* 24.8*  PLT 152 198   Recent Labs    09/28/18 0414 09/29/18 0526  NA 138 137  K 3.5 3.3*  CL 110 107  CO2 21* 24  BUN 13 10  CREATININE 0.79 0.77  GLUCOSE 85 88  CALCIUM 8.0* 8.3*   No results for input(s): LABPT, INR in the last 72 hours.  EXAM General - Patient is Alert, Appropriate and Oriented Left lower extremity - Sensation intact distally Intact pulses distally Dorsiflexion/Plantar flexion intact No cellulitis present Compartment soft  Knee immobilizer intact left lower extremity Dressing - dressing C/D/I and prevenea intact, minimal bloody drainage in canister Motor Function - intact, moving foot and toes well on exam.   Past Medical History:  Diagnosis Date  . Anemia   . Chronic kidney disease, stage 3 ([place])   . Degenerative joint disease (DJD) of lumbar spine   . Diastolic dysfunction   . Dizziness   . Edema   . ETD (eustachian tube dysfunction)   . Heartburn   . History of rectal bleeding    . HTN (hypertension)   . Hypercalcemia   . Intracranial aneurysm   . Low vision, one eye   . Nephritis and nephropathy   . Osteoporosis   . Pyuria   . Urge incontinence   . Urinary frequency   . Urinary retention     Assessment/Plan:   4 Days Post-Op Procedure(s) (LRB): TOTAL HIP REVISION-REMOVING FEMORAL COMPONENT (Left) Active Problems:   Hip dislocation, left ([facility])   Acute post op blood loss anemia    Estimated body mass index is 35.43 kg/m as calculated from the following:   Height as of this encounter: 5\' 3"  (1.6 m).   Weight as of this encounter: 90.7 kg. Advance diet  Acute post op blood loss anemia -hemoglobin 7.9. s/p 1 unit of PRBC yesterday  Continue with Iron supplement. Vital signs stable.  Continue with knee immobilizer left knee. Care management to assist with discharge.   DC lovenox at discharge. Start Aspirin 325 mg daily x 4 weeks Remove Praveena on 10/06/2018 and apply honeycomb dressing Follow-up with [facility] on 10/10/2018 for staple removal and Steri-Strip application  DVT Prophylaxis - Lovenox, TED hose and SCDs   T. Rachelle Hora, PA-C [place] 09/29/2018, 8:30 AM

## 2018-10-01 LAB — AEROBIC/ANAEROBIC CULTURE W GRAM STAIN (SURGICAL/DEEP WOUND)

## 2018-10-02 LAB — CULTURE, BLOOD (ROUTINE X 2)
Culture: NO GROWTH
Culture: NO GROWTH
Special Requests: ADEQUATE
Special Requests: ADEQUATE

## 2018-11-02 NOTE — Progress Notes (Signed)
Patient ID: [name], female   DOB: Aug 19, 1931, 85 y.o.   MRN: [id number]  [employer] PROGRESS NOTE  [name] JYN:[id number] DOB: 03/23/1931 DOA: 09/23/2018 PCP: Marisa Hua, MD  HPI/Subjective: Patient feels okay.  Not having any pain in her hip.  Patient had a drop in hemoglobin after surgery which required blood transfusions.  Had fever last night.  Objective: Vitals:   09/28/18 2306 09/29/18 0809  BP: 140/72 128/61  Pulse: 89 74  Resp: 18 16  Temp: 99.6 F (37.6 C) 99.1 F (37.3 C)  SpO2: 100% 99%    Filed Weights   09/27/18 0500 09/28/18 0500 09/29/18 0500  Weight: 77.3 kg 88.9 kg 90.7 kg    ROS: Review of Systems  Constitutional: Negative for chills and fever.  Eyes: Negative for blurred vision.  Respiratory: Negative for cough and shortness of breath.   Cardiovascular: Negative for chest pain.  Gastrointestinal: Negative for abdominal pain, constipation, diarrhea, nausea and vomiting.  Genitourinary: Negative for dysuria.  Musculoskeletal: Negative for joint pain.  Neurological: Negative for dizziness and headaches.   Exam: Physical Exam  HENT:  Nose: No mucosal edema.  Mouth/Throat: No oropharyngeal exudate or posterior oropharyngeal edema.  Eyes: Pupils are equal, round, and reactive to light. Conjunctivae, EOM and lids are normal.  Neck: No JVD present. Carotid bruit is not present. No edema present. No thyroid mass and no thyromegaly present.  Cardiovascular: S1 normal and S2 normal. Exam reveals no gallop.  No murmur heard. Pulses:      Dorsalis pedis pulses are 2+ on the right side and 2+ on the left side.  Respiratory: No respiratory distress. She has no wheezes. She has no rhonchi. She has no rales.  GI: Soft. Bowel sounds are normal. There is no abdominal tenderness.  Musculoskeletal:     Right ankle: She exhibits no swelling.     Left ankle: She exhibits no swelling.  Lymphadenopathy:    She has no cervical adenopathy.   Neurological: She is alert. No cranial nerve deficit.  Skin: Skin is warm. Nails show no clubbing.  Stage II heel decubiti on the right, which is having some greenish drainage and foul-smelling.  Did not look at the one on the left heel since she just had an operation on that left hip.  Psychiatric: She has a normal mood and affect.      Data Reviewed: Basic Metabolic Panel: No results for input(s): NA, K, CL, CO2, GLUCOSE, BUN, CREATININE, CALCIUM, MG, PHOS in the last 168 hours. CBC: No results for input(s): WBC, NEUTROABS, HGB, HCT, MCV, PLT in the last 168 hours.  CBG: No results for input(s): GLUCAP in the last 168 hours.  No results found for this or any previous visit (from the past 240 hour(s)).   Studies: No results found.  Scheduled Meds:  Continuous Infusions:   Assessment/Plan:  1. Postoperative anemia.  Patient transfused a couple units of packed red blood cells.  Patient has a wound VAC that is draining blood on the left hip.  Continue to monitor hemoglobin.  Transfuse as needed.  Ortho advised to cont lovenox. 2. Left hip dislocation.  Status post procedure yesterday afternoon by Dr. Rudene Christians.  Patient currently has a wound VAC on. 3. Fever. Check UA, Xray chest and start on rocephin. Have chronic catheter. Sent bl and urine cx. Follow cultures. 4. Left fibula fracture.  Knee immobilizer 5. Bilateral heel decubitus ulcers.  Right one now with some foul-smelling drainage.  Asked podiatry  to see the patient.  Appreciate podiatry consultation.  Started Augmentin and doxycycline. 6. Hypertension on atenolol and losartan 7. Neuropathy on gabapentin 8. GERD on Protonix  Code Status: DNR    Code Status Orders  (From admission, onward)         Start     Ordered   09/24/18 0010  Full code  Continuous     09/24/18 0009        Code Status History    Date Active Date Inactive Code Status Order ID Comments User Context   07/24/2018 1502 07/25/2018 1855 DNR  [id number]  Gladstone Lighter, MD Inpatient   12/24/2017 1556 12/29/2017 2033 Full Code [id number]  Dustin Flock, MD Inpatient   12/24/2017 1417 12/24/2017 1556 DNR [id number]  Dustin Flock, MD ED   12/06/2017 1406 12/06/2017 2006 DNR [id number]  Fritzi Mandes, MD Inpatient   12/03/2017 1506 12/06/2017 1406 Full Code [id number]  Idelle Crouch, MD ED   03/28/2017 1649 03/30/2017 2157 DNR [id number]  Hillary Bow, MD ED   06/08/2016 2040 06/11/2016 2141 Full Code [id number]  Hower, Aaron Mose, MD ED    Advance Directive Documentation     Most Recent Value  Type of Advance Directive  Out of facility DNR (pink MOST or yellow form)  Pre-existing out of facility DNR order (yellow form or pink MOST form)  Yellow form placed in chart (order not valid for inpatient use)  "MOST" Form in Place?  -      Disposition Plan: We will go back to  long-term care.  Need to make sure hemoglobin is stable. And no fever for atleast 24 hrs.  Consultants:  Orthopedic surgery  Time spent: 30 minutes  [employer]

## 2019-06-08 ENCOUNTER — Other Ambulatory Visit: Payer: Medicare HMO | Admitting: Urology

## 2019-07-20 ENCOUNTER — Other Ambulatory Visit: Payer: Medicare Other | Admitting: Urology

## 2019-07-31 ENCOUNTER — Other Ambulatory Visit: Payer: Self-pay

## 2019-07-31 DIAGNOSIS — Z9359 Other cystostomy status: Secondary | ICD-10-CM

## 2019-07-31 DIAGNOSIS — N390 Urinary tract infection, site not specified: Secondary | ICD-10-CM

## 2019-07-31 DIAGNOSIS — R339 Retention of urine, unspecified: Secondary | ICD-10-CM

## 2019-08-03 ENCOUNTER — Other Ambulatory Visit: Payer: Medicare Other | Admitting: Urology

## 2019-08-06 ENCOUNTER — Encounter: Payer: Self-pay | Admitting: Urology

## 2019-08-06 ENCOUNTER — Other Ambulatory Visit: Payer: Self-pay

## 2019-08-06 ENCOUNTER — Ambulatory Visit (INDEPENDENT_AMBULATORY_CARE_PROVIDER_SITE_OTHER): Payer: Medicare Other | Admitting: Urology

## 2019-08-06 DIAGNOSIS — R339 Retention of urine, unspecified: Secondary | ICD-10-CM | POA: Diagnosis not present

## 2019-08-06 NOTE — Progress Notes (Signed)
   08/06/19  CC:  Chief Complaint  Patient presents with  . Cysto    HPI: 85 year old female with a history of chronic indwelling suprapubic tube for management of chronic retention who presents today for annual cystoscopy. Her suprapubic tube was originally placed in 07/2016.  NED. A&Ox3.   No respiratory distress   Abd soft, NT, ND Normal external genitalia with patent urethral meatus  Cystoscopy Procedure Note  Patient identification was confirmed, informed consent was obtained, and patient was prepped using Betadine solution.  Lidocaine jelly was administered per SPT tract.    Preoperative abx where received prior to procedure.    Procedure: - Flexible cystoscope introduced via SPT tract without any difficulty.   - Thorough search of the bladder revealed:    normal urothelium with mild cystitis, presumably catheter related on posterior bladder wall    no stones    no ulcers     no tumors    Mild trabeculation    Bladder neck in open confirmation with small amount of whitish plaque-like material consistent with squamous metaplasia  - Ureteral orifices were normal in position and appearance.  Post-Procedure: - Patient tolerated the procedure well  Following the procedure, her suprapubic tube site was reprepped. A 16 French Foley catheter was then inserted without difficulty with return of clear irrigant fluid. The balloon was filled with 10 cc of sterile water.  Assessment/ Plan:  1. Urinary retention S/p annual cysto, will be due again 07/2021 Continue monthly SPT exchanges Generally, would consider upper tract imaging as well for patients with recurrent urinary tract infection but given her advanced age and normal renal function, will defer this  Return in about 1 year (around 08/05/2020) for cysto.   Hollice Espy, MD

## 2019-08-06 NOTE — Progress Notes (Signed)
Suprapubic Cath Change  Patient is present today for a suprapubic catheter change due to urinary retention.  39ml of water was drained from the balloon, a 16FR foley cath was removed from the tract with out difficulty.  Site was cleaned and prepped in a sterile fashion with betadine.  A 16FR foley cath was replaced into the tract no complications were noted. Urine return was noted, 10 ml of sterile water was inflated into the balloon and a night bag was attached for drainage.  Patient tolerated well.   Preformed by: Fonnie Jarvis, CMA  Follow up: 1year

## 2020-01-21 ENCOUNTER — Encounter: Payer: Medicare Other | Attending: Physician Assistant | Admitting: Physician Assistant

## 2020-01-21 ENCOUNTER — Other Ambulatory Visit: Payer: Self-pay

## 2020-01-21 DIAGNOSIS — L89613 Pressure ulcer of right heel, stage 3: Secondary | ICD-10-CM | POA: Insufficient documentation

## 2020-01-21 DIAGNOSIS — Z993 Dependence on wheelchair: Secondary | ICD-10-CM | POA: Diagnosis not present

## 2020-01-21 DIAGNOSIS — D649 Anemia, unspecified: Secondary | ICD-10-CM | POA: Insufficient documentation

## 2020-01-21 DIAGNOSIS — Z833 Family history of diabetes mellitus: Secondary | ICD-10-CM | POA: Diagnosis not present

## 2020-01-21 DIAGNOSIS — L89623 Pressure ulcer of left heel, stage 3: Secondary | ICD-10-CM | POA: Insufficient documentation

## 2020-01-21 DIAGNOSIS — Z87891 Personal history of nicotine dependence: Secondary | ICD-10-CM | POA: Insufficient documentation

## 2020-01-21 DIAGNOSIS — Z8249 Family history of ischemic heart disease and other diseases of the circulatory system: Secondary | ICD-10-CM | POA: Insufficient documentation

## 2020-01-21 DIAGNOSIS — N183 Chronic kidney disease, stage 3 unspecified: Secondary | ICD-10-CM | POA: Insufficient documentation

## 2020-01-21 DIAGNOSIS — Z96653 Presence of artificial knee joint, bilateral: Secondary | ICD-10-CM | POA: Insufficient documentation

## 2020-01-21 DIAGNOSIS — G629 Polyneuropathy, unspecified: Secondary | ICD-10-CM | POA: Diagnosis not present

## 2020-01-21 DIAGNOSIS — Z881 Allergy status to other antibiotic agents status: Secondary | ICD-10-CM | POA: Diagnosis not present

## 2020-01-21 DIAGNOSIS — I83024 Varicose veins of left lower extremity with ulcer of heel and midfoot: Secondary | ICD-10-CM | POA: Diagnosis not present

## 2020-01-21 DIAGNOSIS — I120 Hypertensive chronic kidney disease with stage 5 chronic kidney disease or end stage renal disease: Secondary | ICD-10-CM | POA: Insufficient documentation

## 2020-01-21 NOTE — Progress Notes (Signed)
[name], [name] ([id number]) Visit Report for 01/21/2020 Abuse/Suicide Risk Screen Details Patient Name: [name], DIA. Date of Service: 01/21/2020 9:45 AM Medical Record Number: [id number] Patient Account Number: [id number] Date of Birth/Sex: [birth year] (86 y.o. F) Treating RN: Montey Hora Primary Care Trease Bremner: Marisa Hua Other Clinician: Referring Deletha Jaffee: Marisa Hua Treating Allayah Raineri/Extender: STONE III, HOYT Weeks in Treatment: 0 Abuse/Suicide Risk Screen Items Answer ABUSE RISK SCREEN: Has anyone close to you tried to hurt or harm you recentlyo No Do you feel uncomfortable with anyone in your familyo No Has anyone forced you do things that you didnot want to doo No Electronic Signature(s) Signed: 01/21/2020 4:25:15 PM By: Montey Hora Entered By: Montey Hora on 01/21/2020 10:00:12 [name] ([id number]) -------------------------------------------------------------------------------- Activities of Daily Living Details Patient Name: [name]. Date of Service: 01/21/2020 9:45 AM Medical Record Number: [id number] Patient Account Number: [id number] Date of Birth/Sex: 01-Jul-1931 (86 y.o. F) Treating RN: Montey Hora Primary Care Nyasha Rahilly: Marisa Hua Other Clinician: Referring Jacy Howat: Marisa Hua Treating Abir Eroh/Extender: STONE III, HOYT Weeks in Treatment: 0 Activities of Daily Living Items Answer Activities of Daily Living (Please select one for each item) Drive Automobile Not Able Take Medications Need Assistance Use Telephone Completely Able Care for Appearance Need Assistance Use Toilet Need Assistance Bath / Shower Need Assistance Dress Self Need Assistance Feed Self Completely Able Walk Not Able Get In / Out Bed Need Assistance Housework Not Able Prepare Meals Not Able Handle Money Need Assistance Shop for Self Not Able Electronic Signature(s) Signed: 01/21/2020 4:25:15 PM By: Montey Hora Entered By: Montey Hora  on 01/21/2020 10:00:43 [name] ([id number]) -------------------------------------------------------------------------------- Education Screening Details Patient Name: [name]. Date of Service: 01/21/2020 9:45 AM Medical Record Number: [id number] Patient Account Number: [id number] Date of Birth/Sex: 01/23/1931 (86 y.o. F) Treating RN: Montey Hora Primary Care Cleavon Goldman: Marisa Hua Other Clinician: Referring Devota Viruet: Marisa Hua Treating Aceton Kinnear/Extender: Melburn Hake, HOYT Weeks in Treatment: 0 Primary Learner Assessed: Patient Learning Preferences/Education Level/Primary Language Learning Preference: Explanation, Demonstration Highest Education Level: College or Above Preferred Language: English Cognitive Barrier Language Barrier: No Translator Needed: No Memory Deficit: No Emotional Barrier: No Cultural/Religious Beliefs Affecting Medical Care: No Physical Barrier Impaired Vision: No Impaired Hearing: No Decreased Hand dexterity: No Knowledge/Comprehension Knowledge Level: Medium Comprehension Level: Medium Ability to understand written instructions: Medium Ability to understand verbal instructions: Medium Motivation Anxiety Level: Calm Cooperation: Cooperative Education Importance: Acknowledges Need Interest in Health Problems: Asks Questions Perception: Coherent Willingness to Engage in Self-Management Medium Activities: Readiness to Engage in Self-Management Medium Activities: Electronic Signature(s) Signed: 01/21/2020 4:25:15 PM By: Montey Hora Entered By: Montey Hora on 01/21/2020 10:01:08 [name] ([id number]) -------------------------------------------------------------------------------- Fall Risk Assessment Details Patient Name: [name]. Date of Service: 01/21/2020 9:45 AM Medical Record Number: [id number] Patient Account Number: [id number] Date of Birth/Sex: [birth year] (86 y.o. F) Treating RN: Montey Hora Primary Care Kyrene Longan: Marisa Hua Other Clinician: Referring Ardra Kuznicki: Marisa Hua Treating Brynden Thune/Extender: STONE III, HOYT Weeks in Treatment: 0 Fall Risk Assessment Items Have you had 2 or more falls in the last 12 monthso 0 No Have you had any fall that resulted in injury in the last 12 monthso 0 No FALLS RISK SCREEN History of falling - immediate or within 3 months 0 No Secondary diagnosis (Do you have 2 or more medical diagnoseso) 0 No Ambulatory aid None/bed rest/wheelchair/nurse 0 Yes Crutches/cane/walker 0 No Furniture 0 No Intravenous therapy Access/Saline/Heparin Lock 0 No Gait/Transferring Normal/ bed rest/ wheelchair 0 Yes Weak (short  steps with or without shuffle, stooped but able to lift head while walking, may seek 0 No support from furniture) Impaired (short steps with shuffle, may have difficulty arising from chair, head down, impaired 0 No balance) Mental Status Oriented to own ability 0 Yes Electronic Signature(s) Signed: 01/21/2020 4:25:15 PM By: Montey Hora Entered By: Montey Hora on 01/21/2020 10:01:23 [name] ([id number]) -------------------------------------------------------------------------------- Foot Assessment Details Patient Name: [name]. Date of Service: 01/21/2020 9:45 AM Medical Record Number: [id number] Patient Account Number: [id number] Date of Birth/Sex: 1930-12-27 (86 y.o. F) Treating RN: Montey Hora Primary Care Kratos Ruscitti: Marisa Hua Other Clinician: Referring Aislee Landgren: Marisa Hua Treating Lashundra Shiveley/Extender: STONE III, HOYT Weeks in Treatment: 0 Foot Assessment Items Site Locations + = Sensation present, - = Sensation absent, C = Callus, U = Ulcer R = Redness, W = Warmth, M = Maceration, PU = Pre-ulcerative lesion F = Fissure, S = Swelling, D = Dryness Assessment Right: Left: Other Deformity: No No Prior Foot Ulcer: No No Prior Amputation: No No Charcot Joint: No  No Ambulatory Status: Non-ambulatory Assistance Device: Wheelchair GaitEnergy manager) Signed: 01/21/2020 4:25:15 PM By: Montey Hora Entered By: Montey Hora on 01/21/2020 10:10:00 [name] ([id number]) -------------------------------------------------------------------------------- Nutrition Risk Screening Details Patient Name: [name]. Date of Service: 01/21/2020 9:45 AM Medical Record Number: [id number] Patient Account Number: [id number] Date of Birth/Sex: [birth year] (86 y.o. F) Treating RN: Montey Hora Primary Care Marasia Newhall: Marisa Hua Other Clinician: Referring Bob Daversa: Marisa Hua Treating Syria Kestner/Extender: STONE III, HOYT Weeks in Treatment: 0 Height (in): Weight (lbs): Body Mass Index (BMI): Nutrition Risk Screening Items Score Screening NUTRITION RISK SCREEN: I have an illness or condition that made me change the kind and/or amount of food I eat 0 No I eat fewer than two meals per day 0 No I eat few fruits and vegetables, or milk products 0 No I have three or more drinks of beer, liquor or wine almost every day 0 No I have tooth or mouth problems that make it hard for me to eat 0 No I don't always have enough money to buy the food I need 0 No I eat alone most of the time 0 No I take three or more different prescribed or over-the-counter drugs a day 1 Yes Without wanting to, I have lost or gained 10 pounds in the last six months 0 No I am not always physically able to shop, cook and/or feed myself 0 No Nutrition Protocols Good Risk Protocol 0 No interventions needed Moderate Risk Protocol High Risk Proctocol Risk Level: Good Risk Score: 1 Electronic Signature(s) Signed: 01/21/2020 4:25:15 PM By: Montey Hora Entered By: Montey Hora on 01/21/2020 10:01:30

## 2020-01-21 NOTE — Progress Notes (Signed)
RASHEEMA, TRULUCK ([id number]) Visit Report for 01/21/2020 Allergy List Details Patient Name: [name], [name]. Date of Service: 01/21/2020 9:45 AM Medical Record Number: [id number] Patient Account Number: [id number] Date of Birth/Sex: 11-11-1930 (86 y.o. F) Treating RN: Montey Hora Primary Care Sabrina Arriaga: Marisa Hua Other Clinician: Referring Ariana Cavenaugh: Marisa Hua Treating Brogan England/Extender: STONE III, HOYT Weeks in Treatment: 0 Allergies Active Allergies No Known Drug Allergies Allergy Notes Electronic Signature(s) Signed: 01/21/2020 4:25:15 PM By: Montey Hora Entered By: Montey Hora on 01/21/2020 09:56:59 Fogarty, [name] ([id number]) -------------------------------------------------------------------------------- Arrival Information Details Patient Name: [name]. Date of Service: 01/21/2020 9:45 AM Medical Record Number: [id number] Patient Account Number: [id number] Date of Birth/Sex: [birth year] (86 y.o. F) Treating RN: Montey Hora Primary Care Lam Mccubbins: Marisa Hua Other Clinician: Referring Lattie Riege: Marisa Hua Treating Shaquia Berkley/Extender: Melburn Hake, HOYT Weeks in Treatment: 0 Visit Information Patient Arrived: Wheel Chair Arrival Time: 09:47 Accompanied By: self Transfer Assistance: [place] Patient Identification Verified: Yes Secondary Verification Process Completed: Yes History Since Last Visit Added or deleted any medications: No Any new allergies or adverse reactions: No Had a fall or experienced change in activities of daily living that may affect risk of falls: No Signs or symptoms of abuse/neglect since last visito No Hospitalized since last visit: No Implantable device outside of the clinic excluding cellular tissue based products placed in the center since last visit: No Electronic Signature(s) Signed: 01/21/2020 4:25:15 PM By: Montey Hora Entered By: Montey Hora on 01/21/2020 09:49:01 [name]  ([id number]) -------------------------------------------------------------------------------- Clinic Level of Care Assessment Details Patient Name: [name] Date of Service: 01/21/2020 9:45 AM Medical Record Number: [id number] Patient Account Number: [id number] Date of Birth/Sex: [birth year] (86 y.o. F) Treating RN: Army Melia Primary Care Jacobie Stamey: Marisa Hua Other Clinician: Referring Shakedra Beam: Marisa Hua Treating Naithan Delage/Extender: STONE III, HOYT Weeks in Treatment: 0 Clinic Level of Care Assessment Items TOOL 1 Quantity Score []  - Use when EandM and Procedure is performed on INITIAL visit 0 ASSESSMENTS - Nursing Assessment / Reassessment X - General Physical Exam (combine w/ comprehensive assessment (listed just below) when performed on new pt. 1 20 evals) X- 1 25 Comprehensive Assessment (HX, ROS, Risk Assessments, Wounds Hx, etc.) ASSESSMENTS - Wound and Skin Assessment / Reassessment []  - Dermatologic / Skin Assessment (not related to wound area) 0 ASSESSMENTS - Ostomy and/or Continence Assessment and Care []  - Incontinence Assessment and Management 0 []  - 0 Ostomy Care Assessment and Management (repouching, etc.) PROCESS - Coordination of Care X - Simple Patient / Family Education for ongoing care 1 15 []  - 0 Complex (extensive) Patient / Family Education for ongoing care X- 1 10 Staff obtains Programmer, systems, Records, Test Results / Process Orders []  - 0 Staff telephones HHA, Nursing Homes / Clarify orders / etc []  - 0 Routine Transfer to another Facility (non-emergent condition) []  - 0 Routine Hospital Admission (non-emergent condition) X- 1 15 New Admissions / Biomedical engineer / Ordering NPWT, Apligraf, etc. []  - 0 Emergency Hospital Admission (emergent condition) PROCESS - Special Needs []  - Pediatric / Minor Patient Management 0 []  - 0 Isolation Patient Management []  - 0 Hearing / Language / Visual special needs []  - 0 Assessment of  Community assistance (transportation, D/C planning, etc.) []  - 0 Additional assistance / Altered mentation []  - 0 Support Surface(s) Assessment (bed, cushion, seat, etc.) INTERVENTIONS - Miscellaneous []  - External ear exam 0 []  - 0 Patient Transfer (multiple staff / Civil Service fast streamer / Similar devices) []  - 0 Simple Staple /  Suture removal (25 or less) []  - 0 Complex Staple / Suture removal (26 or more) []  - 0 Hypo/Hyperglycemic Management (do not check if billed separately) [name], [name]. ([id number]) []  - 0 Ankle / Brachial Index (ABI) - do not check if billed separately Has the patient been seen at the hospital within the last three years: Yes Total Score: 85 Level Of Care: New/Established - Level 3 Electronic Signature(s) Signed: 01/21/2020 3:08:20 PM By: Army Melia Entered By: Army Melia on 01/21/2020 10:48:50 [name] ([id number]) -------------------------------------------------------------------------------- Encounter Discharge Information Details Patient Name: [name]. Date of Service: 01/21/2020 9:45 AM Medical Record Number: [id number] Patient Account Number: [id number] Date of Birth/Sex: 1931-08-12 (86 y.o. F) Treating RN: Army Melia Primary Care Zanita Millman: Marisa Hua Other Clinician: Referring Lyndal Reggio: Marisa Hua Treating Koula Venier/Extender: STONE III, HOYT Weeks in Treatment: 0 Encounter Discharge Information Items Post Procedure Vitals Discharge Condition: Stable Temperature (F): 98.2 Ambulatory Status: Wheelchair Pulse (bpm): 67 Discharge Destination: [place] Respiratory Rate (breaths/min): 16 Telephoned: No Blood Pressure (mmHg): 152/62 Orders Sent: Yes Transportation: Private Auto Accompanied By: self Schedule Follow-up Appointment: Yes Clinical Summary of Care: Electronic Signature(s) Signed: 01/21/2020 3:08:20 PM By: Army Melia Entered By: Army Melia on 01/21/2020 10:49:51 [name]  ([id number]) -------------------------------------------------------------------------------- Lower Extremity Assessment Details Patient Name: [name]. Date of Service: 01/21/2020 9:45 AM Medical Record Number: [id number] Patient Account Number: [id number] Date of Birth/Sex: 1931-03-02 (86 y.o. F) Treating RN: Montey Hora Primary Care Nanie Dunkleberger: Marisa Hua Other Clinician: Referring Amayrany Cafaro: Marisa Hua Treating Sarika Baldini/Extender: STONE III, HOYT Weeks in Treatment: 0 Edema Assessment Assessed: [Left: No] [Right: No] Edema: [Left: No] [Right: No] Vascular Assessment Pulses: Dorsalis Pedis Palpable: [Left:Yes] [Right:Yes] Doppler Audible: [Left:Yes] [Right:Yes] Posterior Tibial Palpable: [Left:Yes] [Right:Yes] Doppler Audible: [Left:Yes] [Right:Yes] Blood Pressure: Brachial: [Left:148] Dorsalis Pedis: 180 [Left:Dorsalis Pedis: 60] Ankle: Posterior Tibial: 162 [Left:Posterior Tibial: 172 1.22] [Right:1.16] Electronic Signature(s) Signed: 01/21/2020 4:25:15 PM By: Montey Hora Entered By: Montey Hora on 01/21/2020 10:24:55 [name] ([id number]) -------------------------------------------------------------------------------- Multi Wound Chart Details Patient Name: [name]. Date of Service: 01/21/2020 9:45 AM Medical Record Number: [id number] Patient Account Number: [id number] Date of Birth/Sex: 1931-03-21 (86 y.o. F) Treating RN: Army Melia Primary Care Lam Bjorklund: Marisa Hua Other Clinician: Referring Moira Umholtz: Marisa Hua Treating Verbie Babic/Extender: STONE III, HOYT Weeks in Treatment: 0 Vital Signs Height(in): Pulse(bpm): 40 Weight(lbs): Blood Pressure(mmHg): 152/62 Body Mass Index(BMI): Temperature(F): 98.2 Respiratory Rate(breaths/min): 16 Photos: [N/A:N/A] Wound Location: Right Calcaneus Left Calcaneus N/A Wounding Event: Gradually Appeared Gradually Appeared N/A Primary Etiology: Pressure Ulcer Pressure Ulcer  N/A Comorbid History: Cataracts, Hypertension, Peripheral Cataracts, Hypertension, Peripheral N/A Venous Disease, End Stage Renal Venous Disease, End Stage Renal Disease, History of pressure Disease, History of pressure wounds, Neuropathy wounds, Neuropathy Date Acquired: 12/16/2017 12/16/2017 N/A Weeks of Treatment: 0 0 N/A Wound Status: Open Open N/A Measurements L x W x D (cm) 0.3x0.5x0.1 2.5x3.3x0.3 N/A Area (cm) : 0.118 6.48 N/A Volume (cm) : 0.012 1.944 N/A Classification: Category/Stage II Category/Stage III N/A Exudate Amount: Medium Medium N/A Exudate Type: Serous Serous N/A Exudate Color: amber amber N/A Wound Margin: Flat and Intact Flat and Intact N/A Granulation Amount: Medium (34-66%) Medium (34-66%) N/A Granulation Quality: Pink Pink N/A Necrotic Amount: Medium (34-66%) Medium (34-66%) N/A Necrotic Tissue: Eschar, Adherent [place] N/A Exposed Structures: Fat Layer (Subcutaneous Tissue) Fat Layer (Subcutaneous Tissue) N/A Exposed: Yes Exposed: Yes Fascia: No Fascia: No Tendon: No Tendon: No Muscle: No Muscle: No Joint: No Joint: No Bone: No Bone:  No Epithelialization: Small (1-33%) None N/A Treatment Notes Electronic Signature(s) Signed: 01/21/2020 3:08:20 PM By: Army Melia Entered By: Army Melia on 01/21/2020 10:43:00 [name], [name] ([id number]) [name], [name] ([id number]) -------------------------------------------------------------------------------- [place] Details Patient Name: [name] Date of Service: 01/21/2020 9:45 AM Medical Record Number: [id number] Patient Account Number: [id number] Date of Birth/Sex: [birth year] (86 y.o. F) Treating RN: Army Melia Primary Care Jaysin Gayler: Marisa Hua Other Clinician: Referring Fleur Audino: Marisa Hua Treating Nadea Kirkland/Extender: Melburn Hake, HOYT Weeks in Treatment: 0 Active Inactive Orientation to the Wound Care Program Nursing Diagnoses: Knowledge deficit  related to the wound healing center program Goals: Patient/caregiver will verbalize understanding of the [place] Program Date Initiated: 01/21/2020 Target Resolution Date: 02/22/2020 Goal Status: Active Interventions: Provide education on orientation to the wound center Notes: Pressure Nursing Diagnoses: Knowledge deficit related to causes and risk factors for pressure ulcer development Goals: Patient/caregiver will verbalize risk factors for pressure ulcer development Date Initiated: 01/21/2020 Target Resolution Date: 02/22/2020 Goal Status: Active Interventions: Assess potential for pressure ulcer upon admission and as needed Notes: Wound/Skin Impairment Nursing Diagnoses: Impaired tissue integrity Goals: Ulcer/skin breakdown will have a volume reduction of 30% by week 4 Date Initiated: 01/21/2020 Target Resolution Date: 02/22/2020 Goal Status: Active Interventions: Assess ulceration(s) every visit Notes: Electronic Signature(s) Signed: 01/21/2020 3:08:20 PM By: Army Melia Entered By: Army Melia on 01/21/2020 10:42:42 [name] ([id number] Infante, [name] ([id number]) -------------------------------------------------------------------------------- Pain Assessment Details Patient Name: [name]. Date of Service: 01/21/2020 9:45 AM Medical Record Number: [id number] Patient Account Number: [id number] Date of Birth/Sex: 10-19-1930 (86 y.o. F) Treating RN: Montey Hora Primary Care Kimari Coudriet: Marisa Hua Other Clinician: Referring Artie Mcintyre: Marisa Hua Treating Jenie Parish/Extender: STONE III, HOYT Weeks in Treatment: 0 Active Problems Location of Pain Severity and Description of Pain Patient Has Paino Yes Site Locations Pain Location: Pain in Ulcers With Dressing Change: Yes Pain Management and Medication Current Pain Management: Electronic Signature(s) Signed: 01/21/2020 4:25:15 PM By: Montey Hora Entered By: Montey Hora on 01/21/2020  09:49:19 [name] ([id number]) -------------------------------------------------------------------------------- Patient/Caregiver Education Details Patient Name: [name] Date of Service: 01/21/2020 9:45 AM Medical Record Number: [id number] Patient Account Number: [id number] Date of Birth/Gender: 1931-03-07 (86 y.o. F) Treating RN: Army Melia Primary Care Physician: Marisa Hua Other Clinician: Referring Physician: Marisa Hua Treating Physician/Extender: Melburn Hake, HOYT Weeks in Treatment: 0 Education Assessment Education Provided To: Patient Education Topics Provided Wound/Skin Impairment: Handouts: Caring for Your Ulcer Methods: Demonstration, Explain/Verbal Responses: State content correctly Electronic Signature(s) Signed: 01/21/2020 3:08:20 PM By: Army Melia Entered By: Army Melia on 01/21/2020 10:49:06 [name] ([id number]) -------------------------------------------------------------------------------- Wound Assessment Details Patient Name: [name]. Date of Service: 01/21/2020 9:45 AM Medical Record Number: [id number] Patient Account Number: [id number] Date of Birth/Sex: May 28, 1931 (86 y.o. F) Treating RN: Montey Hora Primary Care Jakorey Mcconathy: Marisa Hua Other Clinician: Referring Taffie Eckmann: Marisa Hua Treating Jaela Yepez/Extender: STONE III, HOYT Weeks in Treatment: 0 Wound Status Wound Number: [id number] Primary Pressure Ulcer Etiology: Wound Location: Right Calcaneus Wound Open Wounding Event: Gradually Appeared Status: Date Acquired: 12/16/2017 Comorbid Cataracts, Hypertension, Peripheral Venous Disease, End Weeks Of Treatment: 0 History: Stage Renal Disease, History of pressure wounds, Clustered Wound: No Neuropathy Photos Wound Measurements Length: (cm) 0.3 Width: (cm) 0.5 Depth: (cm) 0.1 Area: (cm) 0.118 Volume: (cm) 0.012 % Reduction in Area: % Reduction in Volume: Epithelialization: Small  (1-33%) Tunneling: No Undermining: No Wound Description Classification: Category/Stage II Wound Margin: Flat and Intact Exudate Amount: Medium Exudate Type: Serous Exudate  Color: amber Foul Odor After Cleansing: No Slough/Fibrino Yes Wound Bed Granulation Amount: Medium (34-66%) Exposed Structure Granulation Quality: Pink Fascia Exposed: No Necrotic Amount: Medium (34-66%) Fat Layer (Subcutaneous Tissue) Exposed: Yes Necrotic Quality: Eschar, Adherent Slough Tendon Exposed: No Muscle Exposed: No Joint Exposed: No Bone Exposed: No Electronic Signature(s) Signed: 01/21/2020 4:25:15 PM By: Montey Hora Entered By: Montey Hora on 01/21/2020 10:14:26 [name] ([id number]) -------------------------------------------------------------------------------- Wound Assessment Details Patient Name: [name]. Date of Service: 01/21/2020 9:45 AM Medical Record Number: [id number] Patient Account Number: [id number] Date of Birth/Sex: 1930/10/22 (86 y.o. F) Treating RN: Montey Hora Primary Care Trinitey Roache: Marisa Hua Other Clinician: Referring Audrie Kuri: Marisa Hua Treating Sameria Morss/Extender: STONE III, HOYT Weeks in Treatment: 0 Wound Status Wound Number: 12 Primary Pressure Ulcer Etiology: Wound Location: Left Calcaneus Wound Open Wounding Event: Gradually Appeared Status: Date Acquired: 12/16/2017 Comorbid Cataracts, Hypertension, Peripheral Venous Disease, End Weeks Of Treatment: 0 History: Stage Renal Disease, History of pressure wounds, Clustered Wound: No Neuropathy Photos Wound Measurements Length: (cm) 2.5 Width: (cm) 3.3 Depth: (cm) 0.3 Area: (cm) 6.48 Volume: (cm) 1.944 % Reduction in Area: % Reduction in Volume: Epithelialization: None Tunneling: No Undermining: No Wound Description Classification: Category/Stage III Wound Margin: Flat and Intact Exudate Amount: Medium Exudate Type: Serous Exudate Color: amber Foul Odor After  Cleansing: No Slough/Fibrino Yes Wound Bed Granulation Amount: Medium (34-66%) Exposed Structure Granulation Quality: Pink Fascia Exposed: No Necrotic Amount: Medium (34-66%) Fat Layer (Subcutaneous Tissue) Exposed: Yes Necrotic Quality: Adherent Slough Tendon Exposed: No Muscle Exposed: No Joint Exposed: No Bone Exposed: No Electronic Signature(s) Signed: 01/21/2020 4:25:15 PM By: Montey Hora Entered By: Montey Hora on 01/21/2020 10:15:29 [name] ([id number]) -------------------------------------------------------------------------------- [place] Details Patient Name: [name]. Date of Service: 01/21/2020 9:45 AM Medical Record Number: [id number] Patient Account Number: [id number] Date of Birth/Sex: 1931/10/08 (86 y.o. F) Treating RN: Montey Hora Primary Care Irais Mottram: Marisa Hua Other Clinician: Referring Mark Hassey: Marisa Hua Treating Avleen Bordwell/Extender: STONE III, HOYT Weeks in Treatment: 0 Vital Signs Time Taken: 09:50 Temperature (F): 98.2 Pulse (bpm): 67 Respiratory Rate (breaths/min): 16 Blood Pressure (mmHg): 152/62 Reference Range: 80 - 120 mg / dl Electronic Signature(s) Signed: 01/21/2020 4:25:15 PM By: Montey Hora Entered By: Montey Hora on 01/21/2020 09:56:25
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
## 2020-01-28 ENCOUNTER — Other Ambulatory Visit
Admission: RE | Admit: 2020-01-28 | Discharge: 2020-01-28 | Disposition: A | Discharge status: Home / Self Care | Payer: Medicare Other | Source: Ambulatory Visit | Attending: Physician Assistant | Admitting: Physician Assistant

## 2020-01-28 ENCOUNTER — Encounter: Payer: Medicare Other | Admitting: Physician Assistant

## 2020-01-28 ENCOUNTER — Other Ambulatory Visit: Payer: Self-pay

## 2020-01-28 DIAGNOSIS — B999 Unspecified infectious disease: Secondary | ICD-10-CM | POA: Insufficient documentation

## 2020-01-28 DIAGNOSIS — L89623 Pressure ulcer of left heel, stage 3: Secondary | ICD-10-CM | POA: Diagnosis not present

## 2020-01-28 NOTE — Progress Notes (Addendum)
[name] ([id number]) Visit Report for 01/28/2020 Arrival Information Details Patient Name: [name]. Date of Service: 01/28/2020 9:30 AM Medical Record Number: [id number] Patient Account Number: [id number] Date of Birth/Sex: 02-08-1931 (86 y.o. F) Treating RN: [name] Primary Care [name]: [name] Other Clinician: Referring [name]: [name] Treating [name]/Extender: [name] Weeks in Treatment: 1 Visit Information History Since Last Visit Has Dressing in Place as Prescribed: Yes Patient Arrived: Ambulatory Pain Present Now: No Arrival Time: 09:56 Accompanied By: self Transfer Assistance: Harrel Lemon Lift Patient Identification Verified: Yes Secondary Verification Process Completed: Yes Electronic Signature(s) Signed: 01/28/2020 10:25:17 AM By: Danielle Cool, BSN, RN, CWS, Kim RN, BSN Entered By: [name] on 01/28/2020 09:57:31 [name] ([id number]) -------------------------------------------------------------------------------- Clinic Level of Care Assessment Details Patient Name: [name]. Date of Service: 01/28/2020 9:30 AM Medical Record Number: [id number] Patient Account Number: [id number] Date of Birth/Sex: 06-21-1931 (86 y.o. F) Treating RN: [name] Primary Care Jalonda Antigua: [name] Other Clinician: Referring Kariya Lavergne: [name] Treating Kamiah Fite/Extender: [name] Weeks in Treatment: 1 Clinic Level of Care Assessment Items TOOL 4 Quantity Score []  - Use when only an EandM is performed on FOLLOW-UP visit 0 ASSESSMENTS - Nursing Assessment / Reassessment X - Reassessment of Co-morbidities (includes updates in patient status) 1 10 X- 1 5 Reassessment of Adherence to Treatment Plan ASSESSMENTS - Wound and Skin Assessment / Reassessment []  - Simple Wound Assessment / Reassessment - one wound 0 X- 2 5 Complex Wound Assessment / Reassessment - multiple wounds []  - 0 Dermatologic / Skin  Assessment (not related to wound area) ASSESSMENTS - Focused Assessment []  - Circumferential Edema Measurements - multi extremities 0 []  - 0 Nutritional Assessment / Counseling / Intervention []  - 0 Lower Extremity Assessment (monofilament, tuning fork, pulses) []  - 0 Peripheral Arterial Disease Assessment (using hand held doppler) ASSESSMENTS - Ostomy and/or Continence Assessment and Care []  - Incontinence Assessment and Management 0 []  - 0 Ostomy Care Assessment and Management (repouching, etc.) PROCESS - Coordination of Care X - Simple Patient / Family Education for ongoing care 1 15 []  - 0 Complex (extensive) Patient / Family Education for ongoing care []  - 0 Staff obtains Programmer, systems, Records, Test Results / Process Orders []  - 0 Staff telephones HHA, Nursing Homes / Clarify orders / etc []  - 0 Routine Transfer to another Facility (non-emergent condition) []  - 0 Routine Hospital Admission (non-emergent condition) []  - 0 New Admissions / Biomedical engineer / Ordering NPWT, Apligraf, etc. []  - 0 Emergency Hospital Admission (emergent condition) X- 1 10 Simple Discharge Coordination []  - 0 Complex (extensive) Discharge Coordination PROCESS - Special Needs []  - Pediatric / Minor Patient Management 0 []  - 0 Isolation Patient Management []  - 0 Hearing / Language / Visual special needs []  - 0 Assessment of Community assistance (transportation, D/C planning, etc.) [name], [name]. ([id number]) []  - 0 Additional assistance / Altered mentation []  - 0 Support Surface(s) Assessment (bed, cushion, seat, etc.) INTERVENTIONS - Wound Cleansing / Measurement []  - Simple Wound Cleansing - one wound 0 X- 2 5 Complex Wound Cleansing - multiple wounds X- 1 5 Wound Imaging (photographs - any number of wounds) []  - 0 Wound Tracing (instead of photographs) []  - 0 Simple Wound Measurement - one wound X- 2 5 Complex Wound Measurement - multiple wounds INTERVENTIONS - Wound  Dressings []  - Small Wound Dressing one or multiple wounds 0 X- 2 15 Medium Wound Dressing one or multiple wounds []  -  0 Large Wound Dressing one or multiple wounds []  - 0 Application of Medications - topical []  - 0 Application of Medications - injection INTERVENTIONS - Miscellaneous []  - External ear exam 0 []  - 0 Specimen Collection (cultures, biopsies, blood, body fluids, etc.) []  - 0 Specimen(s) / Culture(s) sent or taken to Lab for analysis []  - 0 Patient Transfer (multiple staff / Civil Service fast streamer / Similar devices) []  - 0 Simple Staple / Suture removal (25 or less) []  - 0 Complex Staple / Suture removal (26 or more) []  - 0 Hypo / Hyperglycemic Management (close monitor of Blood Glucose) []  - 0 Ankle / Brachial Index (ABI) - do not check if billed separately X- 1 5 Vital Signs Has the patient been seen at the hospital within the last three years: Yes Total Score: 110 Level Of Care: New/Established - Level 3 Electronic Signature(s) Signed: 01/28/2020 12:33:51 PM By: [name] Entered By: [name] on 01/28/2020 10:28:40 [name] ([id number]) -------------------------------------------------------------------------------- Encounter Discharge Information Details Patient Name: [name]. Date of Service: 01/28/2020 9:30 AM Medical Record Number: [id number] Patient Account Number: [id number] Date of Birth/Sex: 10-31-1930 (86 y.o. F) Treating RN: [name] Primary Care Susi Goslin: [name] Other Clinician: Referring Rachit Grim: [name] Treating Nickey Canedo/Extender: [name] Weeks in Treatment: 1 Encounter Discharge Information Items Discharge Condition: Stable Ambulatory Status: Wheelchair Discharge Destination: Home Transportation: Private Auto Accompanied By: self Schedule Follow-up Appointment: Yes Clinical Summary of Care: Electronic Signature(s) Signed: 01/28/2020 12:33:51 PM By: [name] Entered By: [name] on  01/28/2020 10:29:40 [name] ([id number]) -------------------------------------------------------------------------------- Lower Extremity Assessment Details Patient Name: [name]. Date of Service: 01/28/2020 9:30 AM Medical Record Number: [id number] Patient Account Number: [id number] Date of Birth/Sex: November 22, 1930 (86 y.o. F) Treating RN: [name] Primary Care Tuere Nwosu: [name] Other Clinician: Referring Reyah Streeter: [name] Treating Jolleen Seman/Extender: [name] Weeks in Treatment: 1 Vascular Assessment Pulses: Dorsalis Pedis Palpable: [Left:Yes] [Right:Yes] Electronic Signature(s) Signed: 01/28/2020 10:25:17 AM By: Danielle Cool, BSN, RN, CWS, Kim RN, BSN Entered By: [name] on 01/28/2020 10:12:56 [name] ([id number]) -------------------------------------------------------------------------------- Multi Wound Chart Details Patient Name: [name] Date of Service: 01/28/2020 9:30 AM Medical Record Number: [id number] Patient Account Number: [id number] Date of Birth/Sex: Sep 19, 1931 (86 y.o. F) Treating RN: [name] Primary Care Josef Tourigny: [name] Other Clinician: Referring Ishia Tenorio: [name] Treating Eshan Trupiano/Extender: STONE III, Zuniga Weeks in Treatment: 1 Vital Signs Height(in): Pulse(bpm): 72 Weight(lbs): Blood Pressure(mmHg): 174/69 Body Mass Index(BMI): Temperature(F): 97.6 Respiratory Rate(breaths/min): 16 Photos: [N/A:N/A] Wound Location: Right Calcaneus Left Calcaneus N/A Wounding Event: Gradually Appeared Gradually Appeared N/A Primary Etiology: Pressure Ulcer Pressure Ulcer N/A Comorbid History: Cataracts, Hypertension, Peripheral Cataracts, Hypertension, Peripheral N/A Venous Disease, End Stage Renal Venous Disease, End Stage Renal Disease, History of pressure Disease, History of pressure wounds, Neuropathy wounds, Neuropathy Date Acquired: 12/16/2017 12/16/2017 N/A Weeks of Treatment:  1 1 N/A Wound Status: Open Open N/A Pending Amputation on Yes Yes N/A Presentation: Measurements L x W x D (cm) 1.5x1.5x0.1 3x3.2x0.3 N/A Area (cm) : 1.767 7.54 N/A Volume (cm) : 0.177 2.262 N/A % Reduction in Area: -1397.50% -16.40% N/A % Reduction in Volume: -1375.00% -16.40% N/A Classification: Category/Stage II Category/Stage III N/A Exudate Amount: Medium Medium N/A Exudate Type: Serous Serous N/A Exudate Color: amber amber N/A Wound Margin: Flat and Intact Flat and Intact N/A Granulation Amount: Medium (34-66%) Medium (34-66%) N/A Granulation Quality: Pink Pink N/A Necrotic Amount: Medium (34-66%) Medium (34-66%) N/A Necrotic Tissue: Eschar,  [place] N/A Exposed Structures: Fat Layer (Subcutaneous Tissue) Fat Layer (Subcutaneous Tissue) N/A Exposed: Yes Exposed: Yes Fascia: No Fascia: No Tendon: No Tendon: No Muscle: No Muscle: No Joint: No Joint: No Bone: No Bone: No Epithelialization: Small (1-33%) None N/A Assessment Notes: N/A maceration around wound bed N/A Treatment Notes ADRIE, PICKING ([id number]) Electronic Signature(s) Signed: 01/28/2020 12:33:51 PM By: [name] Entered By: [name] on 01/28/2020 10:24:34 [name] ([id number]) -------------------------------------------------------------------------------- [place] Details Patient Name: [name]. Date of Service: 01/28/2020 9:30 AM Medical Record Number: [id number] Patient Account Number: [id number] Date of Birth/Sex: 02/01/1931 (86 y.o. F) Treating RN: [name] Primary Care Chantrell Apsey: [name] Other Clinician: Referring Elbert Spickler: [name] Treating Zyaire Mccleod/Extender: [name] Weeks in Treatment: 1 Active Inactive Orientation to the Wound Care Program Nursing Diagnoses: Knowledge deficit related to the wound healing center program Goals: Patient/caregiver will verbalize understanding of the [facility] Program Date Initiated: 01/21/2020 Target Resolution Date: 02/22/2020 Goal Status: Active Interventions: Provide education on orientation to the wound center Notes: Pressure Nursing Diagnoses: Knowledge deficit related to causes and risk factors for pressure ulcer development Goals: Patient/caregiver will verbalize risk factors for pressure ulcer development Date Initiated: 01/21/2020 Target Resolution Date: 02/22/2020 Goal Status: Active Interventions: Assess potential for pressure ulcer upon admission and as needed Notes: Wound/Skin Impairment Nursing Diagnoses: Impaired tissue integrity Goals: Ulcer/skin breakdown will have a volume reduction of 30% by week 4 Date Initiated: 01/21/2020 Target Resolution Date: 02/22/2020 Goal Status: Active Interventions: Assess ulceration(s) every visit Notes: Electronic Signature(s) Signed: 01/28/2020 12:33:51 PM By: [name] Entered By: [name] on 01/28/2020 10:24:22 [name] ([id number] Infante, Bobbe Zuniga ([id number]) -------------------------------------------------------------------------------- Pain Assessment Details Patient Name: [name]. Date of Service: 01/28/2020 9:30 AM Medical Record Number: [id number] Patient Account Number: [id number] Date of Birth/Sex: Jun 29, 1931 (86 y.o. F) Treating RN: [name] Primary Care Britta Louth: [name] Other Clinician: Referring Javis Abboud: [name] Treating Maliya Marich/Extender: [name] Weeks in Treatment: 1 Active Problems Location of Pain Severity and Description of Pain Patient Has Paino No Site Locations Pain Management and Medication Current Pain Management: Electronic Signature(s) Signed: 01/28/2020 10:25:17 AM By: Danielle Cool, BSN, RN, CWS, Kim RN, BSN Entered By: [name] on 01/28/2020 09:58:39 [name] ([id number]) -------------------------------------------------------------------------------- Patient/Caregiver Education  Details Patient Name: [name] Date of Service: 01/28/2020 9:30 AM Medical Record Number: [id number] Patient Account Number: [id number] Date of Birth/Gender: 04/01/1931 (86 y.o. F) Treating RN: [name] Primary Care Physician: [name] Other Clinician: Referring Physician: Marisa Zuniga Treating Physician/Extender: [name] Weeks in Treatment: 1 Education Assessment Education Provided To: Patient Education Topics Provided Wound/Skin Impairment: Handouts: Caring for Your Ulcer Methods: Demonstration, Explain/Verbal Responses: State content correctly Electronic Signature(s) Signed: 01/28/2020 12:33:51 PM By: [name] Entered By: [name] on 01/28/2020 10:28:52 [name] ([id number]) -------------------------------------------------------------------------------- Wound Assessment Details Patient Name: [name]. Date of Service: 01/28/2020 9:30 AM Medical Record Number: [id number] Patient Account Number: [id number] Date of Birth/Sex: October 30, 1930 (86 y.o. F) Treating RN: [name] Primary Care Marx Doig: [name] Other Clinician: Referring Ira Dougher: [name] Treating Jeramyah Goodpasture/Extender: STONE III, Zuniga Weeks in Treatment: 1 Wound Status Wound Number: 11 Primary Pressure Ulcer Etiology: Wound Location: Right Calcaneus Wound Open Wounding Event: Gradually Appeared Status: Date Acquired: 12/16/2017 Comorbid Cataracts, Hypertension, Peripheral Venous Disease, End Weeks Of Treatment: 1 History: Stage Renal Disease, History of pressure wounds, Clustered Wound: No Neuropathy Pending Amputation On Presentation Photos  Wound Measurements Length: (cm) 1.5 % Reduct Width: (cm) 1.5 % Reduct Depth: (cm) 0.1 Epitheli Area: (cm) 1.767 Tunneli Volume: (cm) 0.177 Undermi ion in Area: -1397.5% ion in Volume: -1375% alization: Small (1-33%) ng: No ning: No Wound Description Classification: Category/Stage II Foul  Odo Wound Margin: Flat and Intact Slough/F Exudate Amount: Medium Exudate Type: Serous Exudate Color: amber r After Cleansing: No ibrino Yes Wound Bed Granulation Amount: Medium (34-66%) Exposed Structure Granulation Quality: Pink Fascia Exposed: No Necrotic Amount: Medium (34-66%) Fat Layer (Subcutaneous Tissue) Exposed: Yes Necrotic Quality: Eschar, Adherent Slough Tendon Exposed: No Muscle Exposed: No Joint Exposed: No Bone Exposed: No Treatment Notes Wound #11 (Right Calcaneus) Notes scell, telfa [employer]) KALIA, VAHEY ([id number]) Signed: 01/28/2020 10:25:17 AM By: Danielle Cool, BSN, RN, CWS, Kim RN, BSN Entered By: [name] on 01/28/2020 10:11:38 [name] ([id number]) -------------------------------------------------------------------------------- Wound Assessment Details Patient Name: [name]. Date of Service: 01/28/2020 9:30 AM Medical Record Number: [id number] Patient Account Number: [id number] Date of Birth/Sex: 1930-12-30 (86 y.o. F) Treating RN: [name] Primary Care Myreon Wimer: [name] Other Clinician: Referring Pamela Intrieri: [name] Treating Merly Hinkson/Extender: STONE III, Zuniga Weeks in Treatment: 1 Wound Status Wound Number: 12 Primary Pressure Ulcer Etiology: Wound Location: Left Calcaneus Wound Open Wounding Event: Gradually Appeared Status: Date Acquired: 12/16/2017 Comorbid Cataracts, Hypertension, Peripheral Venous Disease, End Weeks Of Treatment: 1 History: Stage Renal Disease, History of pressure wounds, Clustered Wound: No Neuropathy Pending Amputation On Presentation Photos Wound Measurements Length: (cm) 3 % Reduct Width: (cm) 3.2 % Reduct Depth: (cm) 0.3 Epitheli Area: (cm) 7.54 Tunneli Volume: (cm) 2.262 Undermi ion in Area: -16.4% ion in Volume: -16.4% alization: None ng: No ning: No Wound Description Classification: Category/Stage III Foul Odo Wound Margin: Flat and  Intact Slough/F Exudate Amount: Medium Exudate Type: Serous Exudate Color: amber r After Cleansing: No ibrino Yes Wound Bed Granulation Amount: Medium (34-66%) Exposed Structure Granulation Quality: Pink Fascia Exposed: No Necrotic Amount: Medium (34-66%) Fat Layer (Subcutaneous Tissue) Exposed: Yes Necrotic Quality: Adherent Slough Tendon Exposed: No Muscle Exposed: No Joint Exposed: No Bone Exposed: No Assessment Notes maceration around wound bed Treatment Notes Wound #12 (Left Calcaneus) Notes AZALIYAH, KENNARD ([id number]) scell, [employer]) Signed: 01/28/2020 10:25:17 AM By: Danielle Cool, BSN, RN, CWS, Kim RN, BSN Entered By: [name] on 01/28/2020 10:12:31 [name] ([id number]) -------------------------------------------------------------------------------- [place] Details Patient Name: [name] Date of Service: 01/28/2020 9:30 AM Medical Record Number: [id number] Patient Account Number: [id number] Date of Birth/Sex: [birth year] (86 y.o. F) Treating RN: [name] Primary Care Brenen Beigel: [name] Other Clinician: Referring Rashena Dowling: [name] Treating Latessa Tillis/Extender: STONE III, Zuniga Weeks in Treatment: 1 Vital Signs Time Taken: 09:57 Temperature (F): 97.6 Pulse (bpm): 63 Respiratory Rate (breaths/min): 16 Blood Pressure (mmHg): 174/69 Reference Range: 80 - 120 mg / dl Electronic Signature(s) Signed: 01/28/2020 10:25:17 AM By: Danielle Cool, BSN, RN, CWS, Kim RN, BSN Entered By: [name] on 01/28/2020 09:58:25

## 2020-01-28 NOTE — Progress Notes (Addendum)
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 Subsequently since that point she has been maintaining but there has not been a dramatic improvement in the overall wound size. She does have discomfort although this does not seem to be terribly uncomfortable for her at this time. Patient does have definite pressure that is getting to the wound sites or least has in the past although now with her offloading boots that is no longer the case. She does have evidence of some venous stasis as well which may be complicating the picture and preventing improvement overall. There is no new injury to indicate additional pressure to the site. 11/10/17 she presents today in follow-up evaluation of bilateral heel ulcers. There is improvement noted and these are close to being completely epithelialized. We will continue with current treatment plan with expectation of follow-up next week. 12/01/17-she presents today in follow-up evaluation for bilateral heel ulcers. The left heel appears to have newly formed epithelial tissue with no observable moisture or drainage, the right heel has a miniscule amount of partial-thickness opening. There is no signs of infection. There is concern for pressure to the left lateral foot and left lateral malleolus. The left lateral foot has blanchable erythema and the left lateral malleolus  appears to have resolving deep tissue injury. She states she is in the offloading boots 24/7. She is unable to follow-up next week secondary to transportation and will follow up in 2 weeks at which time I anticipate a discharge. 12/22/17-she is here in follow-up evaluation for bilateral heel ulcers. She is currently at [facility] for rehabilitation but will be discharged home tomorrow. She is expressing concerns regarding safety and has been strongly encouraged to speak with the discharge planner about transition to long- term care since her rehabilitation coverage has been met. The right heel has evidence of new/unrelieved pressure; she states that the offloading boots have been on 24/7 at the facility. The left heel is healed. She will follow up next week 01/10/18 on evaluation today patient appears to be doing fairly well in regard to her heels. She has been tolerating the dressing changes without complication. With that being said patient does have ulcerations noted of the bilateral  heels at this point. The right does seem to be worse than left though both wounds required sharp debridement today. She is having no significant discomfort which is great news. Readmission: 01/21/2020 patient presents today for follow-up concerning her bilateral heel ulcers. I previously have seen her back in 2019 both in clinic as well as in the skilled nursing facility when I was still doing that. Subsequently she unfortunately still has the wounds on the bilateral heels that I saw her for back at that point. She still is at [facility] all fields although apparently the name has changed I am not sure what the new name of the facility is. Nonetheless she does still have the Prevalon offloading boots that she is previously utilized and again that does seem to have been beneficial for her. Said she still has obvious signs of pressure injury to the left heel at this time unfortunately. That does have me somewhat  concerned to be perfectly honest. Especially in light of the fact that she tells me she wears the boots at all times I am not sure exactly where the pressure injury is coming from. 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 01/28/2020 upon evaluation today patient appears to be doing well in general with regard to her heel ulcers. There is much less necrotic tissue as compared to the last evaluation last week. With that being said there does not appear to be any signs of active infection at this time. No fevers, chills, nausea, vomiting, or diarrhea. With that being said there was somewhat of an odor to the left heel it also appears potentially there could be some infection here I am going to see about looking into a culture for her today. Objective Constitutional Well-nourished and well-hydrated in no acute distress. Vitals Time Taken: 9:57 AM, Temperature: 97.6 F, Pulse: 63 bpm, Respiratory Rate: 16 breaths/min, Blood Pressure: 174/69 mmHg. Respiratory normal breathing without difficulty. Psychiatric this patient is able to make decisions and demonstrates good insight into disease process. Alert and Oriented x 3. pleasant and cooperative. [name], [name] ([id number]) General Notes: Upon inspection patient's wound bed actually showed signs of good granulation on the left mostly although there did appear to be a little bit of a drainage noted as well as odor and this is something that we going to see about addressing today. I did not have to perform any sharp debridement although I did obtain a culture from the left heel today. Right heel seems to be doing okay there is less pressure noted today as compared to last time but nonetheless there is still some pressure here as well. Integumentary (Hair, Skin) Wound #11 status is Open.  Original cause of wound was Gradually Appeared. The wound is located on the Right Calcaneus. The wound measures 1.5cm length x 1.5cm width x 0.1cm depth; 1.767cm^2 area and 0.177cm^3 volume. There is Fat Layer (Subcutaneous Tissue) Exposed exposed. There is no tunneling or undermining noted. There is a medium amount of serous drainage noted. The wound margin is flat and intact. There is medium (34-66%) pink granulation within the wound bed. There is a medium (34-66%) amount of necrotic tissue within the wound bed including Eschar and Adherent Slough. Wound #12 status is Open. Original cause of wound was Gradually Appeared. The wound is located on the Left Calcaneus. The wound measures  3cm length x 3.2cm width x 0.3cm depth; 7.54cm^2 area and 2.262cm^3 volume. There is Fat Layer (Subcutaneous Tissue) Exposed exposed. There is no tunneling or undermining noted. There is a medium amount of serous drainage noted. The wound margin is flat and intact. There is medium (34-66%) pink granulation within the wound bed. There is a medium (34-66%) amount of necrotic tissue within the wound bed including Adherent Slough. General Notes: maceration around wound bed Assessment Active Problems ICD-10 Pressure ulcer of left heel, stage 3 Pressure ulcer of right heel, stage 3 Essential (primary) hypertension Chronic kidney disease, stage 3 unspecified Varicose veins of left lower extremity with ulcer of heel and midfoot Dependence on wheelchair Plan Wound Cleansing: Wound #11 Right Calcaneus: Clean wound with Normal Saline. Clean wound with Normal Saline. Wound #12 Left Calcaneus: Clean wound with Normal Saline. Clean wound with Normal Saline. Primary Wound Dressing: Wound #11 Right Calcaneus: Silver Alginate Silver Alginate Wound #12 Left Calcaneus: Silver Alginate Silver Alginate Secondary Dressing: Wound #11 Right Calcaneus: [place] Dressing Change Frequency: Wound #11 Right  Calcaneus: Change dressing every day. Wound #12 Left Calcaneus: Change dressing every day. Follow-up Appointments: Wound #11 Right Calcaneus: Return Appointment in 2 weeks. Wound #12 Left Calcaneus: Return Appointment in 2 weeks. Off-Loading: Wound #11 Right Calcaneus: Heel suspension boot BRITTNE, KAWASAKI. ([id number]) Turn and reposition every 2 hours Wound #12 Left Calcaneus: Heel suspension boot Turn and reposition every 2 hours Laboratory ordered were: Wound culture routine - left heel The following medication(s) was prescribed: Augmentin oral 875 mg-125 mg tablet 1 1 tablet oral taken 2 times per day for 14 days starting 02/01/2020 1. My suggestion at this point is good be that we go ahead and initiate treatment with continuation of the silver alginate dressing that seems to have done fairly well over the past week. 2. I am also can recommend the patient continue with offloading using the Prevalon offloading boots or equivalent she has those with her today as well she has been wearing his. 3. I am also can recommend the patient needs to be turned and repositioned at least every 2 hours to try to keep pressure off of her heels. We will see patient back for reevaluation in 1 week here in the clinic. If anything worsens or changes patient will contact our office for additional recommendations. Electronic Signature(s) Signed: 02/01/2020 4:34:01 PM By: Worthy Keeler PA-C Previous Signature: 01/28/2020 10:32:54 AM Version By: Worthy Keeler PA-C Entered By: Worthy Keeler on 02/01/2020 16:34:01 [name], [name] ([id number]) -------------------------------------------------------------------------------- SuperBill Details Patient Name: [name] Date of Service: 01/28/2020 Medical Record Number: [id number] Patient Account Number: [id number] Date of Birth/Sex: 11-25-1930 (86 y.o. F) Treating RN: Army Melia Primary Care Provider: Marisa Hua Other Clinician: Referring  Provider: Marisa Hua Treating Provider/Extender: Melburn Hake, Tiffany Talarico Weeks in Treatment: 1 Diagnosis Coding ICD-10 Codes Code Description [phone] Pressure ulcer of left heel, stage 3 L89.613 Pressure ulcer of right heel, stage 3 I10 Essential (primary) hypertension N18.30 Chronic kidney disease, stage 3 unspecified I83.024 Varicose veins of left lower extremity with ulcer of heel and midfoot Z99.3 Dependence on wheelchair Facility Procedures CPT4 Code: [id number] Description: 99213 - WOUND CARE VISIT-LEV 3 EST PT Modifier: Quantity: 1 Physician Procedures CPT4 Code: [id number] Description: 99214 - WC PHYS LEVEL 4 - EST PT Modifier: Quantity: 1 CPT4 Code: Description: ICD-10 Diagnosis Description L89.623 Pressure ulcer of left heel, stage 3 L89.613 Pressure ulcer of right heel, stage 3 I10 Essential (primary) hypertension N18.30 Chronic  kidney disease, stage 3 unspecified Modifier: Quantity: Electronic Signature(s) Signed: 01/28/2020 10:33:12 AM By: Worthy Keeler PA-C Entered By: Worthy Keeler on 01/28/2020 10:33:11

## 2020-02-01 LAB — AEROBIC CULTURE W GRAM STAIN (SUPERFICIAL SPECIMEN)

## 2020-02-01 LAB — AEROBIC CULTURE? (SUPERFICIAL SPECIMEN)

## 2020-02-04 ENCOUNTER — Encounter: Payer: Medicare Other | Admitting: Physician Assistant

## 2020-02-11 ENCOUNTER — Other Ambulatory Visit: Payer: Self-pay

## 2020-02-11 ENCOUNTER — Encounter: Payer: Medicare Other | Admitting: Physician Assistant

## 2020-02-11 DIAGNOSIS — L89623 Pressure ulcer of left heel, stage 3: Secondary | ICD-10-CM | POA: Diagnosis not present

## 2020-02-11 NOTE — Progress Notes (Addendum)
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 regard to her bilateral lower extremity ulcers. She has been tolerating the dressing changes fairly well. Unfortunately overall even though she is tolerating this her wound has been very slow to heal. We have previously treated her with Affinity grafts and she did have a lot of good improvement prior to having to be admitted to the hospital. Subsequently since that point she has been maintaining but there has not been a dramatic improvement in the overall wound size. She does have discomfort although this does not seem to be terribly uncomfortable for her at this time. Patient does have definite pressure that is getting to the wound sites or least has in the past although now with her offloading boots that is no longer the case. She does have evidence of some venous stasis as well which may be complicating the picture and preventing improvement overall. There is no new injury to indicate additional pressure to the site. 11/10/17 she presents today in follow-up evaluation of bilateral heel ulcers. There is improvement noted and these are close to being completely epithelialized. We will continue with current treatment plan with expectation of follow-up next week. 12/01/17-she presents today in follow-up evaluation for bilateral heel ulcers. The left heel appears  to have newly formed epithelial tissue with no observable moisture or drainage, the right heel has a miniscule amount of partial-thickness opening. There is no signs of infection. There is concern for pressure to the left lateral foot and left lateral malleolus. The left lateral foot has blanchable erythema and the left lateral malleolus appears to have resolving deep tissue injury. She states she is in the offloading boots 24/7. She is unable to follow-up next week secondary to transportation and will follow up in 2 Zuniga at which time I anticipate a discharge. 12/22/17-she is here in follow-up evaluation for bilateral heel ulcers. She is currently at [facility] for rehabilitation but will be discharged home tomorrow. She is expressing concerns regarding safety and has been strongly encouraged to speak with the discharge planner about transition to long-term care since her rehabilitation coverage has been met. The right heel has evidence of new/unrelieved pressure; she states that the offloading boots  have been on 24/7 at the facility. The left heel is healed. She will follow up next week 01/10/18 on evaluation today patient appears to be doing fairly well in regard to her heels. She has been tolerating the dressing changes without complication. With that being said patient does have ulcerations noted of the bilateral heels at this point. The right does seem to be worse than left though both wounds required sharp debridement today. She is having no significant discomfort which is great news. Readmission: 01/21/2020 patient presents today for follow-up concerning her bilateral heel ulcers. I previously have seen her back in 2019 both in clinic as well as in the skilled nursing facility when I was still doing that. Subsequently she unfortunately still has the wounds on the bilateral heels that I saw her for back at that point. She still is at [facility] all fields although apparently the name  has changed I am not sure what the new name of the facility is. Nonetheless she does still have the Prevalon offloading boots that she is previously utilized and again that does seem to have been beneficial for her. Said she still has obvious signs of pressure injury to the left heel at this time unfortunately. That does have me somewhat concerned to be perfectly honest. Especially in light of the fact that she tells me she wears the boots at all times I am not sure exactly where the pressure injury is coming from. 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 01/28/2020 upon evaluation today patient appears to be doing well in general with regard to her heel ulcers. There is much less necrotic tissue as compared to the last evaluation last week. With that being said there does not appear to be any signs of active infection at this time. No fevers, chills, nausea, vomiting, or diarrhea. With that being said there was somewhat of an odor to the left heel it also appears potentially there could be some infection here I am going to see about looking into a culture for her today. 02/11/2020 upon evaluation today patient appears to be doing better in general with regard to her wounds. Fortunately there is no signs of active infection at this time. Unfortunately she does have some blue-green drainage from the right heel this has me concerned about the possibility of Pseudomonas to be honest. The clindamycin that she is currently on would not cover for this. It would need to be something such as Cipro or Levaquin. Nonetheless I think that something we may need to go ahead and start her today apparently she finishes the clindamycin which for some reason after 2 days of the Augmentin she was switched to the patient really does not know why but  nonetheless the clindamycin should have been appropriate based on the culture results as well. Objective Constitutional Well-nourished and well-hydrated in no acute distress. Vitals Time Taken: 9:32 AM, Temperature: 98.9 F, Pulse: 63 bpm, Respiratory Rate: 16 breaths/min, Blood Pressure: 142/58 mmHg. Respiratory normal breathing without difficulty. Psychiatric this patient is able to make decisions and demonstrates good insight into disease process. Alert and Oriented x 3. pleasant and cooperative. General Notes: Patient's wound bed currently showed signs of improvement at both heel locations in my opinion as far as the overall appearance but again in regard to the drainage I am still concerned  about infection here to be honest. [name], [name]. ([id number]) Integumentary (Hair, Skin) Wound #11 status is Open. Original cause of wound was Gradually Appeared. The wound is located on the Right Calcaneus. The wound measures 2cm length x 4cm width x 0.1cm depth; 6.283cm^2 area and 0.628cm^3 volume. There is Fat Layer (Subcutaneous Tissue) Exposed exposed. There is a medium amount of purulent drainage noted. The wound margin is flat and intact. There is medium (34-66%) pink granulation within the wound bed. There is a medium (34-66%) amount of necrotic tissue within the wound bed including Eschar and Adherent Slough. Wound #12 status is Open. Original cause of wound was Gradually Appeared. The wound is located on the Left Calcaneus. The wound measures 2cm length x 2cm width x 0.3cm depth; 3.142cm^2 area and 0.942cm^3 volume. There is Fat Layer (Subcutaneous Tissue) Exposed exposed. There is no tunneling or undermining noted. There is a medium amount of serosanguineous drainage noted. The wound margin is flat and intact. There is medium (34-66%) pink granulation within the wound bed. There is a medium (34-66%) amount of necrotic tissue within the wound bed including Adherent  Slough. 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 Clean wound with Normal Saline. Wound #12 Left Calcaneus: Clean wound with Normal Saline. Clean wound with Normal Saline. Primary Wound Dressing: Wound #11 Right Calcaneus: Silver Alginate Silver Alginate Wound #12 Left Calcaneus: Silver Alginate Silver Alginate Secondary Dressing: Wound #11 Right Calcaneus: Hinds #12 Left Calcaneus: [place] Dressing Change Frequency: Wound #11 Right Calcaneus: Change dressing every day. Wound #12 Left Calcaneus: Change dressing every day. Follow-up Appointments: Wound #11 Right Calcaneus: Return Appointment in 2 Zuniga. Wound #12 Left Calcaneus: Return Appointment in 2 Zuniga. Off-Loading: Wound #11 Right Calcaneus: Heel suspension boot Turn and reposition every 2 hours Wound #12 Left Calcaneus: Heel suspension boot Turn and reposition every 2 hours The following medication(s) was prescribed: Levaquin oral 500 mg tablet 1 1 tablet oral taken 1 times a day for 14 days. Do not take iron while on this medication starting 02/11/2020 [name], [name] ([id number]) 1. I would recommend currently that we go ahead and initiate treatment with continuation of the silver alginate dressings. I still think that is the best thing for her. 2. With regard to the blue-green drainage I am get initiate treatment with Levaquin at this time she actually finishes the clindamycin tomorrow it looks like and then subsequently the Levaquin will be added. She does not need to be taking iron with this that was marked on the prescription as well. 3. I am also can recommend at this  time that the patient continue to elevate her feet as well as keep them offloaded appropriately with the Prevalon or equivalent offloading boots which she does have on today upon coming into the office as well. We will see patient back for reevaluation in 2 Zuniga here in the clinic. If anything worsens or changes patient will contact our office for additional recommendations. Electronic Signature(s) Signed: 02/11/2020 10:06:58 AM By: Worthy Keeler PA-C Entered By: Worthy Keeler on 02/11/2020 10:06:57 [name] ([id number]) -------------------------------------------------------------------------------- SuperBill Details Patient Name: [name] Date of Service: 02/11/2020 Medical Record Number: [id number] Patient Account Number: [id number] Date  of Birth/Sex: June 17, 1931 (86 y.o. 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 Z99.3 Dependence on wheelchair Facility Procedures CPT4 Code: [id number] Description: 99213 - WOUND CARE VISIT-LEV 3 EST PT Modifier: Quantity: 1 Physician Procedures CPT4 Code: [id number] Description: 68115 - WC PHYS LEVEL 3 - EST PT Modifier: Quantity: 1 CPT4 Code: Description: ICD-10 Diagnosis Description L89.623 Pressure ulcer of left heel, stage 3 L89.613 Pressure ulcer of right heel, stage 3 I10 Essential (primary) hypertension N18.30 Chronic kidney disease, stage 3 unspecified Modifier: Quantity: Electronic Signature(s) Signed: 02/11/2020 10:09:22 AM By: Worthy Keeler PA-C Entered By: Worthy Keeler  on 02/11/2020 10:09:21

## 2020-02-14 NOTE — Progress Notes (Signed)
ELIZABETH, PAULSEN ([id number]) Visit Report for 02/11/2020 Arrival Information Details Patient Name: LEATRICE, PARILLA. Date of Service: 02/11/2020 9:30 AM Medical Record Number: [id number] Patient Account Number: [id number] Date of Birth/Sex: 05-11-1931 (86 y.o. F) Treating RN: Cornell Barman Primary Care Jihad Brownlow: Marisa Hua Other Clinician: Referring Fred Hammes: Marisa Hua Treating Markan Cazarez/Extender: Melburn Hake, HOYT Weeks in Treatment: 3 Visit Information History Since Last Visit Has Dressing in Place as Prescribed: Yes Patient Arrived: Wheel Chair Pain Present Now: No Arrival Time: 09:32 Accompanied By: self Transfer Assistance: None Patient Identification Verified: Yes Secondary Verification Process Completed: Yes Notes Patient remained in here wheelchair for todays visit. Electronic Signature(s) Signed: 02/13/2020 4:39:52 PM By: Gretta Cool, BSN, RN, CWS, Kim RN, BSN Entered By: Gretta Cool, BSN, RN, CWS, Kim on 02/11/2020 09:32:43 [name] ([id number]) -------------------------------------------------------------------------------- Clinic Level of Care Assessment Details Patient Name: HALYNN, REITANO. Date of Service: 02/11/2020 9:30 AM Medical Record Number: [id number] Patient Account Number: [id number] Date of Birth/Sex: Feb 27, 1931 (86 y.o. F) Treating RN: Army Melia Primary Care Marieanne Marxen: Marisa Hua Other Clinician: Referring Shanya Ferriss: Marisa Hua Treating Addilyn Satterwhite/Extender: Melburn Hake, HOYT Weeks in Treatment: 3 Clinic Level of Care Assessment Items TOOL 4 Quantity Score []  - Use when only an EandM is performed on FOLLOW-UP visit 0 ASSESSMENTS - Nursing Assessment / Reassessment X - Reassessment of Co-morbidities (includes updates in patient status) 1 10 X- 1 5 Reassessment of Adherence to Treatment Plan ASSESSMENTS - Wound and Skin Assessment / Reassessment []  - Simple Wound Assessment / Reassessment - one wound 0 X- 2 5 Complex Wound Assessment /  Reassessment - multiple wounds []  - 0 Dermatologic / Skin Assessment (not related to wound area) ASSESSMENTS - Focused Assessment []  - Circumferential Edema Measurements - multi extremities 0 []  - 0 Nutritional Assessment / Counseling / Intervention []  - 0 Lower Extremity Assessment (monofilament, tuning fork, pulses) []  - 0 Peripheral Arterial Disease Assessment (using hand held doppler) ASSESSMENTS - Ostomy and/or Continence Assessment and Care []  - Incontinence Assessment and Management 0 []  - 0 Ostomy Care Assessment and Management (repouching, etc.) PROCESS - Coordination of Care X - Simple Patient / Family Education for ongoing care 1 15 []  - 0 Complex (extensive) Patient / Family Education for ongoing care []  - 0 Staff obtains Programmer, systems, Records, Test Results / Process Orders []  - 0 Staff telephones HHA, Nursing Homes / Clarify orders / etc []  - 0 Routine Transfer to another Facility (non-emergent condition) []  - 0 Routine Hospital Admission (non-emergent condition) []  - 0 New Admissions / Biomedical engineer / Ordering NPWT, Apligraf, etc. []  - 0 Emergency Hospital Admission (emergent condition) X- 1 10 Simple Discharge Coordination []  - 0 Complex (extensive) Discharge Coordination PROCESS - Special Needs []  - Pediatric / Minor Patient Management 0 []  - 0 Isolation Patient Management []  - 0 Hearing / Language / Visual special needs []  - 0 Assessment of Community assistance (transportation, D/C planning, etc.) []  - 0 Additional assistance / Altered mentation []  - 0 Support Surface(s) Assessment (bed, cushion, seat, etc.) INTERVENTIONS - Wound Cleansing / Measurement Mika, Shamica S. ([id number]) []  - 0 Simple Wound Cleansing - one wound X- 1 5 Complex Wound Cleansing - multiple wounds X- 1 5 Wound Imaging (photographs - any number of wounds) []  - 0 Wound Tracing (instead of photographs) []  - 0 Simple Wound Measurement - one wound X- 2 5 Complex  Wound Measurement - multiple wounds INTERVENTIONS - Wound Dressings []  - Small Wound Dressing one or multiple wounds 0 X- 2  15 Medium Wound Dressing one or multiple wounds []  - 0 Large Wound Dressing one or multiple wounds []  - 0 Application of Medications - topical []  - 0 Application of Medications - injection INTERVENTIONS - Miscellaneous []  - External ear exam 0 []  - 0 Specimen Collection (cultures, biopsies, blood, body fluids, etc.) []  - 0 Specimen(s) / Culture(s) sent or taken to Lab for analysis []  - 0 Patient Transfer (multiple staff / Civil Service fast streamer / Similar devices) []  - 0 Simple Staple / Suture removal (25 or less) []  - 0 Complex Staple / Suture removal (26 or more) []  - 0 Hypo / Hyperglycemic Management (close monitor of Blood Glucose) []  - 0 Ankle / Brachial Index (ABI) - do not check if billed separately X- 1 5 Vital Signs Has the patient been seen at the hospital within the last three years: Yes Total Score: 105 Level Of Care: New/Established - Level 3 Electronic Signature(s) Signed: 02/11/2020 4:20:49 PM By: Army Melia Entered By: Army Melia on 02/11/2020 10:01:35 [name] ([id number]) -------------------------------------------------------------------------------- Encounter Discharge Information Details Patient Name: [name]. Date of Service: 02/11/2020 9:30 AM Medical Record Number: [id number] Patient Account Number: [id number] Date of Birth/Sex: [birth year] (86 y.o. F) Treating RN: Army Melia Primary Care Jaymes Hang: Marisa Hua Other Clinician: Referring Malu Pellegrini: Marisa Hua Treating Stevey Stapleton/Extender: Melburn Hake, HOYT Weeks in Treatment: 3 Encounter Discharge Information Items Discharge Condition: Stable Ambulatory Status: Wheelchair Discharge Destination: Skilled Nursing Facility Telephoned: No Orders Sent: Yes Transportation: Private Auto Accompanied By: self Schedule Follow-up Appointment: Yes Clinical Summary of  Care: Electronic Signature(s) Signed: 02/11/2020 4:20:49 PM By: Army Melia Entered By: Army Melia on 02/11/2020 10:02:30 [name] ([id number]) -------------------------------------------------------------------------------- Lower Extremity Assessment Details Patient Name: [name]. Date of Service: 02/11/2020 9:30 AM Medical Record Number: [id number] Patient Account Number: [id number] Date of Birth/Sex: Mar 03, 1931 (86 y.o. F) Treating RN: Cornell Barman Primary Care Maverick Dieudonne: Marisa Hua Other Clinician: Referring Patryce Depriest: Marisa Hua Treating Jameson Morrow/Extender: Melburn Hake, HOYT Weeks in Treatment: 3 Vascular Assessment Pulses: Dorsalis Pedis Palpable: [Left:Yes] [Right:Yes] Electronic Signature(s) Signed: 02/13/2020 4:39:52 PM By: Gretta Cool, BSN, RN, CWS, Kim RN, BSN Entered By: Gretta Cool, BSN, RN, CWS, Kim on 02/11/2020 09:45:08 [name] ([id number]) -------------------------------------------------------------------------------- Multi Wound Chart Details Patient Name: [name] Date of Service: 02/11/2020 9:30 AM Medical Record Number: [id number] Patient Account Number: [id number] Date of Birth/Sex: 1931/01/03 (86 y.o. F) Treating RN: Army Melia Primary Care Ireta Pullman: Marisa Hua Other Clinician: Referring Bryson Palen: Marisa Hua Treating Imane Burrough/Extender: STONE III, HOYT Weeks in Treatment: 3 Vital Signs Height(in): Pulse(bpm): 65 Weight(lbs): Blood Pressure(mmHg): 142/58 Body Mass Index(BMI): Temperature(F): 98.9 Respiratory Rate(breaths/min): 16 Photos: [N/A:N/A] Wound Location: Right Calcaneus Left Calcaneus N/A Wounding Event: Gradually Appeared Gradually Appeared N/A Primary Etiology: Pressure Ulcer Pressure Ulcer N/A Comorbid History: Cataracts, Hypertension, Peripheral Cataracts, Hypertension, Peripheral N/A Venous Disease, End Stage Renal Venous Disease, End Stage Renal Disease, History of pressure Disease, History of  pressure wounds, Neuropathy wounds, Neuropathy Date Acquired: 12/16/2017 12/16/2017 N/A Weeks of Treatment: 3 3 N/A Wound Status: Open Open N/A Pending Amputation on Yes Yes N/A Presentation: Measurements L x W x D (cm) 2x4x0.1 2x2x0.3 N/A Area (cm) : 6.283 3.142 N/A Volume (cm) : 0.628 0.942 N/A % Reduction in Area: -5224.60% 51.50% N/A % Reduction in Volume: -5133.30% 51.50% N/A Classification: Category/Stage II Category/Stage III N/A Exudate Amount: Medium Medium N/A Exudate Type: Purulent Serosanguineous N/A Exudate Color: yellow, brown, green red, brown N/A Wound Margin: Flat and Intact Flat and Intact N/A Granulation Amount:  Medium (34-66%) Medium (34-66%) N/A Granulation Quality: Pink Pink N/A Necrotic Amount: Medium (34-66%) Medium (34-66%) N/A Necrotic Tissue: Eschar, Adherent [place] N/A Exposed Structures: Fat Layer (Subcutaneous Tissue) Fat Layer (Subcutaneous Tissue) N/A Exposed: Yes Exposed: Yes Fascia: No Fascia: No Tendon: No Tendon: No Muscle: No Muscle: No Joint: No Joint: No Bone: No Bone: No Epithelialization: Small (1-33%) None N/A Treatment Notes Electronic Signature(s) Signed: 02/11/2020 4:20:49 PM By: Cyndia Bent ([id number]) Entered By: Army Melia on 02/11/2020 09:55:46 [name] ([id number]) -------------------------------------------------------------------------------- [place] Details Patient Name: [name]. Date of Service: 02/11/2020 9:30 AM Medical Record Number: [id number] Patient Account Number: [id number] Date of Birth/Sex: [birth year] (86 y.o. F) Treating RN: Army Melia Primary Care Adeola Dennen: Marisa Hua Other Clinician: Referring Suhana Wilner: Marisa Hua Treating [employer]/Extender: Melburn Hake, HOYT Weeks in Treatment: 3 Active Inactive Orientation to the Wound Care Program Nursing Diagnoses: Knowledge deficit related to the wound healing center  program Goals: Patient/caregiver will verbalize understanding of the [facility] Program Date Initiated: 01/21/2020 Target Resolution Date: 02/22/2020 Goal Status: Active Interventions: Provide education on orientation to the wound center Notes: Pressure Nursing Diagnoses: Knowledge deficit related to causes and risk factors for pressure ulcer development Goals: Patient/caregiver will verbalize risk factors for pressure ulcer development Date Initiated: 01/21/2020 Target Resolution Date: 02/22/2020 Goal Status: Active Interventions: Assess potential for pressure ulcer upon admission and as needed Notes: Wound/Skin Impairment Nursing Diagnoses: Impaired tissue integrity Goals: Ulcer/skin breakdown will have a volume reduction of 30% by week 4 Date Initiated: 01/21/2020 Target Resolution Date: 02/22/2020 Goal Status: Active Interventions: Assess ulceration(s) every visit Notes: Electronic Signature(s) Signed: 02/11/2020 4:20:49 PM By: Army Melia Entered By: Army Melia on 02/11/2020 09:55:34 Justus, [name] ([id number]) -------------------------------------------------------------------------------- Pain Assessment Details Patient Name: [name]. Date of Service: 02/11/2020 9:30 AM Medical Record Number: [id number] Patient Account Number: [id number] Date of Birth/Sex: 1931-02-15 (86 y.o. F) Treating RN: Cornell Barman Primary Care Kain Milosevic: Marisa Hua Other Clinician: Referring Anant Agard: Marisa Hua Treating Brevyn Ring/Extender: Melburn Hake, HOYT Weeks in Treatment: 3 Active Problems Location of Pain Severity and Description of Pain Patient Has Paino No Site Locations Pain Management and Medication Current Pain Management: Electronic Signature(s) Signed: 02/13/2020 4:39:52 PM By: Gretta Cool, BSN, RN, CWS, Kim RN, BSN Entered By: Gretta Cool, BSN, RN, CWS, Kim on 02/11/2020 09:41:10 [name]  ([id number]) -------------------------------------------------------------------------------- Patient/Caregiver Education Details Patient Name: [name] Date of Service: 02/11/2020 9:30 AM Medical Record Number: [id number] Patient Account Number: [id number] Date of Birth/Gender: May 03, 1931 (86 y.o. F) Treating RN: Army Melia Primary Care Physician: Marisa Hua Other Clinician: Referring Physician: Marisa Hua Treating Physician/Extender: Sharalyn Ink in Treatment: 3 Education Assessment Education Provided To: Patient Education Topics Provided Wound/Skin Impairment: Handouts: Caring for Your Ulcer Methods: Demonstration, Explain/Verbal Responses: State content correctly Electronic Signature(s) Signed: 02/11/2020 4:20:49 PM By: Army Melia Entered By: Army Melia on 02/11/2020 10:01:48 [name] ([id number]) -------------------------------------------------------------------------------- Wound Assessment Details Patient Name: [name]. Date of Service: 02/11/2020 9:30 AM Medical Record Number: [id number] Patient Account Number: [id number] Date of Birth/Sex: [birth year] (86 y.o. F) Treating RN: Cornell Barman Primary Care Dell Briner: Marisa Hua Other Clinician: Referring Josha Weekley: Marisa Hua Treating Varick Keys/Extender: STONE III, HOYT Weeks in Treatment: 3 Wound Status Wound Number: 11 Primary Pressure Ulcer Etiology: Wound Location: Right Calcaneus Wound Open Wounding Event: Gradually Appeared Status: Date Acquired: 12/16/2017 Comorbid Cataracts, Hypertension, Peripheral Venous Disease, End Weeks Of Treatment: 3 History: Stage Renal Disease, History of pressure wounds, Clustered  Wound: No Neuropathy Pending Amputation On Presentation Photos Wound Measurements Length: (cm) 2 Width: (cm) 4 Depth: (cm) 0.1 Area: (cm) 6.283 Volume: (cm) 0.628 % Reduction in Area: -5224.6% % Reduction in Volume: -5133.3% Epithelialization:  Small (1-33%) Wound Description Classification: Category/Stage II Foul Wound Margin: Flat and Intact Sloug Exudate Amount: Medium Exudate Type: Purulent Exudate Color: yellow, brown, green Odor After Cleansing: No h/Fibrino Yes Wound Bed Granulation Amount: Medium (34-66%) Exposed Structure Granulation Quality: Pink Fascia Exposed: No Necrotic Amount: Medium (34-66%) Fat Layer (Subcutaneous Tissue) Exposed: Yes Necrotic Quality: Eschar, Adherent Slough Tendon Exposed: No Muscle Exposed: No Joint Exposed: No Bone Exposed: No Treatment Notes Wound #11 (Right Calcaneus) Notes scell, [address] ([id number]) Electronic Signature(s) Signed: 02/13/2020 4:39:52 PM By: Gretta Cool, BSN, RN, CWS, Kim RN, BSN Entered By: Gretta Cool, BSN, RN, CWS, Kim on 02/11/2020 09:44:17 [name] ([id number]) -------------------------------------------------------------------------------- Wound Assessment Details Patient Name: [name]. Date of Service: 02/11/2020 9:30 AM Medical Record Number: [id number] Patient Account Number: [id number] Date of Birth/Sex: [birth year] (86 y.o. F) Treating RN: Cornell Barman Primary Care Keshun Berrett: Marisa Hua Other Clinician: Referring Edelin Fryer: Marisa Hua Treating Adekunle Rohrbach/Extender: Melburn Hake, HOYT Weeks in Treatment: 3 Wound Status Wound Number: 12 Primary Pressure Ulcer Etiology: Wound Location: Left Calcaneus Wound Open Wounding Event: Gradually Appeared Status: Date Acquired: 12/16/2017 Comorbid Cataracts, Hypertension, Peripheral Venous Disease, End Weeks Of Treatment: 3 History: Stage Renal Disease, History of pressure wounds, Clustered Wound: No Neuropathy Pending Amputation On Presentation Photos Wound Measurements Length: (cm) 2 % Redu Width: (cm) 2 % Redu Depth: (cm) 0.3 Epithe Area: (cm) 3.142 [place] Volume: (cm) 0.942 Under ction in Area: 51.5% ction in Volume: 51.5% lialization: None ling: No mining:  No Wound Description Classification: Category/Stage III Foul O Wound Margin: Flat and Intact Slough Exudate Amount: Medium Exudate Type: Serosanguineous Exudate Color: red, brown dor After Cleansing: No /Fibrino Yes Wound Bed Granulation Amount: Medium (34-66%) Exposed Structure Granulation Quality: Pink Fascia Exposed: No Necrotic Amount: Medium (34-66%) Fat Layer (Subcutaneous Tissue) Exposed: Yes Necrotic Quality: Adherent Slough Tendon Exposed: No Muscle Exposed: No Joint Exposed: No Bone Exposed: No Treatment Notes Wound #12 (Left Calcaneus) Notes scell, [address] ([id number]) Electronic Signature(s) Signed: 02/13/2020 4:39:52 PM By: Gretta Cool, BSN, RN, CWS, Kim RN, BSN Entered By: Gretta Cool, BSN, RN, CWS, Kim on 02/11/2020 09:44:55 [place], [name] ([id number]) -------------------------------------------------------------------------------- [place] Details Patient Name: [name] Date of Service: 02/11/2020 9:30 AM Medical Record Number: [id number] Patient Account Number: [id number] Date of Birth/Sex: July 04, 1931 (86 y.o. F) Treating RN: Cornell Barman Primary Care Kyarah Enamorado: Marisa Hua Other Clinician: Referring Franky Reier: Marisa Hua Treating Jahnya Trindade/Extender: Melburn Hake, HOYT Weeks in Treatment: 3 Vital Signs Time Taken: 09:32 Temperature (F): 98.9 Pulse (bpm): 63 Respiratory Rate (breaths/min): 16 Blood Pressure (mmHg): 142/58 Reference Range: 80 - 120 mg / dl Electronic Signature(s) Signed: 02/13/2020 4:39:52 PM By: Gretta Cool, BSN, RN, CWS, Kim RN, BSN Entered By: Gretta Cool, BSN, RN, CWS, Kim on 02/11/2020 09:33:18

## 2020-02-25 ENCOUNTER — Encounter: Payer: Medicare Other | Attending: Physician Assistant | Admitting: Physician Assistant

## 2020-02-25 ENCOUNTER — Other Ambulatory Visit: Payer: Self-pay

## 2020-02-25 DIAGNOSIS — Z993 Dependence on wheelchair: Secondary | ICD-10-CM | POA: Diagnosis not present

## 2020-02-25 DIAGNOSIS — Z96653 Presence of artificial knee joint, bilateral: Secondary | ICD-10-CM | POA: Diagnosis not present

## 2020-02-25 DIAGNOSIS — I120 Hypertensive chronic kidney disease with stage 5 chronic kidney disease or end stage renal disease: Secondary | ICD-10-CM | POA: Insufficient documentation

## 2020-02-25 DIAGNOSIS — L89623 Pressure ulcer of left heel, stage 3: Secondary | ICD-10-CM | POA: Diagnosis not present

## 2020-02-25 DIAGNOSIS — L89613 Pressure ulcer of right heel, stage 3: Secondary | ICD-10-CM | POA: Insufficient documentation

## 2020-02-25 DIAGNOSIS — N183 Chronic kidney disease, stage 3 unspecified: Secondary | ICD-10-CM | POA: Diagnosis not present

## 2020-02-25 DIAGNOSIS — D631 Anemia in chronic kidney disease: Secondary | ICD-10-CM | POA: Diagnosis not present

## 2020-02-25 DIAGNOSIS — L89629 Pressure ulcer of left heel, unspecified stage: Secondary | ICD-10-CM | POA: Diagnosis present

## 2020-02-25 DIAGNOSIS — L97429 Non-pressure chronic ulcer of left heel and midfoot with unspecified severity: Secondary | ICD-10-CM | POA: Insufficient documentation

## 2020-02-25 DIAGNOSIS — I83024 Varicose veins of left lower extremity with ulcer of heel and midfoot: Secondary | ICD-10-CM | POA: Insufficient documentation

## 2020-02-25 NOTE — Progress Notes (Addendum)
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 infection. There is concern for pressure to the left lateral foot and left lateral malleolus. The left lateral foot has blanchable erythema and the left lateral malleolus appears to have resolving deep tissue injury. She states she is in the offloading boots 24/7. She is unable to follow-up next week secondary to transportation and will follow up in 2 weeks at which time I anticipate a discharge. 12/22/17-she is here in follow-up evaluation for bilateral heel ulcers. She is currently at [facility] for rehabilitation but will be discharged home tomorrow. She is expressing concerns regarding safety and has been strongly encouraged to speak with the discharge planner about transition to long-term care since her rehabilitation coverage has been met. The right heel has evidence of new/unrelieved pressure; she states that the offloading boots have been on 24/7 at the facility. The left heel is healed. She will follow up next week 01/10/18 on evaluation today patient appears to be doing fairly well in regard to her heels. She has been tolerating the dressing changes without complication. With that being said patient does have ulcerations noted of the bilateral heels at this point. The right does seem to be worse than left though both wounds required sharp debridement  today. She is having no significant discomfort which is great news. Readmission: 01/21/2020 patient presents today for follow-up concerning her bilateral heel ulcers. I previously have seen her back in 2019 both in clinic as well as in the skilled nursing facility when I was still doing that. Subsequently she unfortunately still has the wounds on the bilateral heels that I saw her for back at that point. She still is at [facility] all fields although apparently the name has changed I am not sure what the new name of the facility is. Nonetheless she does still have the Prevalon offloading boots that she is previously utilized and again that does seem to have been beneficial for her. Said she still has obvious signs of pressure injury to the  left heel at this time unfortunately. That does have me somewhat concerned to be perfectly honest. Especially in light of the fact that she tells me she wears the boots at all times I am not sure exactly where the pressure injury is coming from. 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 01/28/2020 upon evaluation today patient appears to be doing well in general with regard to her heel ulcers. There is much less necrotic tissue as compared to the last evaluation last week. With that being said there does not appear to be any signs of active infection at this time. No fevers, chills, nausea, vomiting, or diarrhea. With that being said there was somewhat of an odor to the left heel it also appears potentially there could be some infection here I am going to see about looking into a culture for her today. 02/11/2020 upon evaluation today patient appears to be doing better in general with regard to her wounds. Fortunately there is no signs of active infection at this time. Unfortunately she does have  some blue-green drainage from the right heel this has me concerned about the possibility of Pseudomonas to be honest. The clindamycin that she is currently on would not cover for this. It would need to be something such as Cipro or Levaquin. Nonetheless I think that something we may need to go ahead and start her today apparently she finishes the clindamycin which for some reason after 2 days of the Augmentin she was switched to the patient really does not know why but nonetheless the clindamycin should have been appropriate based on the culture results as well. 02/25/2020 upon evaluation today patient's heels actually appear to be doing well at this point. There is no sign of active infection and overall she seems to be doing quite well which is great news. Objective Constitutional Well-nourished and well-hydrated in no acute distress. Vitals Time Taken: 11:01 AM, Temperature: 98.3 F, Pulse: 67 bpm, Respiratory Rate: 16 breaths/min, Blood Pressure: 145/67 mmHg. Respiratory normal breathing without difficulty. Psychiatric this patient is able to make decisions and demonstrates good insight into disease process. Alert and Oriented x 3. pleasant and cooperative. YAJAYRA, FELDT ([id number]) Integumentary (Hair, Skin) Wound #11 status is Open. Original cause of wound was Gradually Appeared. The wound is located on the Right Calcaneus. The wound measures 3.5cm length x 1cm width x 0.2cm depth; 2.749cm^2 area and 0.55cm^3 volume. There is Fat Layer (Subcutaneous Tissue) Exposed exposed. There is a medium amount of purulent drainage noted. The wound margin is flat and intact. There is medium (34-66%) pink granulation within the wound bed. There is a medium (34-66%) amount of necrotic tissue within the wound bed including Eschar and Adherent Slough. Wound #12 status is Open. Original cause of wound was Gradually Appeared. The wound is located on the Left Calcaneus. The wound measures 3.5cm length x  3cm width x 0.2cm depth; 8.247cm^2 area and 1.649cm^3 volume. There is Fat Layer (Subcutaneous Tissue) Exposed exposed. There is a medium amount of serosanguineous drainage noted. The wound margin is flat and intact. There is medium (34-66%) pink granulation within the wound bed. There is a medium (34-66%) amount of necrotic tissue within the wound bed including Adherent Slough. Assessment Active Problems ICD-10 Pressure ulcer of left heel, stage 3 Pressure ulcer of right heel, stage 3 Essential (primary) hypertension Chronic  kidney disease, stage 3 unspecified Varicose veins of left lower extremity with ulcer of heel and midfoot Dependence on wheelchair Plan Wound Cleansing: Wound #11 Right Calcaneus: Clean wound with Normal Saline. Clean wound with Normal Saline. Wound #12 Left Calcaneus: Clean wound with Normal Saline. Clean wound with Normal Saline. Primary Wound Dressing: Wound #11 Right Calcaneus: Silver Alginate Wound #12 Left Calcaneus: Silver Alginate Secondary Dressing: Wound #11 Right Calcaneus: Dillard Wound #12 Left Calcaneus: [place] Dressing Change Frequency: Wound #11 Right Calcaneus: Change dressing every day. Wound #12 Left Calcaneus: Change dressing every day. Follow-up Appointments: Wound #11 Right Calcaneus: Return Appointment in 2 weeks. Wound #12 Left Calcaneus: Return Appointment in 2 weeks. Off-Loading: Wound #11 Right Calcaneus: Heel suspension boot Turn and reposition every 2 hours Wound #12 Left Calcaneus: Heel suspension boot Turn and reposition every 2 hours 1. At this time my suggestion is good to be that we go ahead and continue with the silver alginate dressing along with the Prevalon offloading boots. She is done very well with this. 2. I am also can recommend that she continue to elevate her legs and keep pressure off of her heels as much as possible. She is in a skilled nursing facility and they should be helping with  this. EITHEL, RYALL ([id number]) 3. I am also can recommend the patient needs to continue to monitor for any signs of infection at the facility obviously I will see anything going on today and this is good news. We will see patient back for reevaluation in 1 week here in the clinic. If anything worsens or changes patient will contact our office for additional recommendations. Electronic Signature(s) Signed: 02/25/2020 5:31:21 PM By: Worthy Keeler PA-C Entered By: Worthy Keeler on 02/25/2020 17:31:21 [name] ([id number]) -------------------------------------------------------------------------------- SuperBill Details Patient Name: [name] Date of Service: 02/25/2020 Medical Record Number: [id number] Patient Account Number: [id number] Date of Birth/Sex: [birth year] (86 y.o. 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 Z99.3 Dependence on wheelchair Facility Procedures CPT4 Code: [id number] Description: 99213 - WOUND CARE VISIT-LEV 3 EST PT Modifier: Quantity: 1 Physician Procedures CPT4 Code: [id number] Description: 01027 - WC PHYS LEVEL 3 - EST PT Modifier: Quantity: 1 CPT4 Code: Description: ICD-10 Diagnosis Description L89.623 Pressure ulcer of left heel, stage 3 L89.613 Pressure ulcer of right heel, stage 3 I10 Essential (primary) hypertension N18.30 Chronic kidney disease, stage 3 unspecified Modifier: Quantity: Electronic Signature(s) Signed: 02/25/2020 5:31:46 PM By: Worthy Keeler PA-C Entered By: Worthy Keeler on 02/25/2020 17:31:46

## 2020-02-25 NOTE — Progress Notes (Addendum)
OKTOBER, GLAZER ([id number]) Visit Report for 02/25/2020 Arrival Information Details Patient Name: [name], [name]. Date of Service: 02/25/2020 10:30 AM Medical Record Number: [id number] Patient Account Number: [id number] Date of Birth/Sex: [birth year] (86 y.o. F) Treating RN: Army Melia Primary Care Filomena Pokorney: Marisa Hua Other Clinician: Referring Sruti Ayllon: Marisa Hua Treating [name] [name]/Extender: Melburn Hake, HOYT Weeks in Treatment: 5 Visit Information History Since Last Visit Added or deleted any medications: No Patient Arrived: Wheel Chair Any new allergies or adverse reactions: No Arrival Time: 11:01 Had a fall or experienced change in No Accompanied By: self activities of daily living that may affect Transfer Assistance: None risk of falls: Patient Identification Verified: Yes Signs or symptoms of abuse/neglect since last visito No Hospitalized since last visit: No Has Dressing in Place as Prescribed: Yes Pain Present Now: No Electronic Signature(s) Signed: 02/25/2020 4:16:16 PM By: Army Melia Entered By: Army Melia on 02/25/2020 11:01:32 [name] ([id number]) -------------------------------------------------------------------------------- Clinic Level of Care Assessment Details Patient Name: [name] Date of Service: 02/25/2020 10:30 AM Medical Record Number: [id number] Patient Account Number: [id number] Date of Birth/Sex: 21-Dec-1930 (86 y.o. F) Treating RN: Army Melia Primary Care Danaysia Rader: Marisa Hua Other Clinician: Referring Wreatha Sturgeon: Marisa Hua Treating Annaleia Pence/Extender: Melburn Hake, HOYT Weeks in Treatment: 5 Clinic Level of Care Assessment Items TOOL 4 Quantity Score []  - Use when only an EandM is performed on FOLLOW-UP visit 0 ASSESSMENTS - Nursing Assessment / Reassessment X - Reassessment of Co-morbidities (includes updates in patient status) 1 10 X- 1 5 Reassessment of Adherence to Treatment Plan ASSESSMENTS - Wound  and Skin Assessment / Reassessment []  - Simple Wound Assessment / Reassessment - one wound 0 X- 2 5 Complex Wound Assessment / Reassessment - multiple wounds []  - 0 Dermatologic / Skin Assessment (not related to wound area) ASSESSMENTS - Focused Assessment []  - Circumferential Edema Measurements - multi extremities 0 []  - 0 Nutritional Assessment / Counseling / Intervention []  - 0 Lower Extremity Assessment (monofilament, tuning fork, pulses) []  - 0 Peripheral Arterial Disease Assessment (using hand held doppler) ASSESSMENTS - Ostomy and/or Continence Assessment and Care []  - Incontinence Assessment and Management 0 []  - 0 Ostomy Care Assessment and Management (repouching, etc.) PROCESS - Coordination of Care X - Simple Patient / Family Education for ongoing care 1 15 []  - 0 Complex (extensive) Patient / Family Education for ongoing care []  - 0 Staff obtains Programmer, systems, Records, Test Results / Process Orders []  - 0 Staff telephones HHA, Nursing Homes / Clarify orders / etc []  - 0 Routine Transfer to another Facility (non-emergent condition) []  - 0 Routine Hospital Admission (non-emergent condition) []  - 0 New Admissions / Biomedical engineer / Ordering NPWT, Apligraf, etc. []  - 0 Emergency Hospital Admission (emergent condition) X- 1 10 Simple Discharge Coordination []  - 0 Complex (extensive) Discharge Coordination PROCESS - Special Needs []  - Pediatric / Minor Patient Management 0 []  - 0 Isolation Patient Management []  - 0 Hearing / Language / Visual special needs []  - 0 Assessment of Community assistance (transportation, D/C planning, etc.) []  - 0 Additional assistance / Altered mentation []  - 0 Support Surface(s) Assessment (bed, cushion, seat, etc.) INTERVENTIONS - Wound Cleansing / Measurement Buffkin, Frida S. ([id number]) []  - 0 Simple Wound Cleansing - one wound X- 2 5 Complex Wound Cleansing - multiple wounds X- 1 5 Wound Imaging (photographs - any  number of wounds) []  - 0 Wound Tracing (instead of photographs) []  - 0 Simple Wound Measurement - one wound X- 2  5 Complex Wound Measurement - multiple wounds INTERVENTIONS - Wound Dressings []  - Small Wound Dressing one or multiple wounds 0 X- 2 15 Medium Wound Dressing one or multiple wounds []  - 0 Large Wound Dressing one or multiple wounds []  - 0 Application of Medications - topical []  - 0 Application of Medications - injection INTERVENTIONS - Miscellaneous []  - External ear exam 0 []  - 0 Specimen Collection (cultures, biopsies, blood, body fluids, etc.) []  - 0 Specimen(s) / Culture(s) sent or taken to Lab for analysis []  - 0 Patient Transfer (multiple staff / Civil Service fast streamer / Similar devices) []  - 0 Simple Staple / Suture removal (25 or less) []  - 0 Complex Staple / Suture removal (26 or more) []  - 0 Hypo / Hyperglycemic Management (close monitor of Blood Glucose) []  - 0 Ankle / Brachial Index (ABI) - do not check if billed separately X- 1 5 Vital Signs Has the patient been seen at the hospital within the last three years: Yes Total Score: 110 Level Of Care: New/Established - Level 3 Electronic Signature(s) Signed: 02/25/2020 4:16:16 PM By: Army Melia Entered By: Army Melia on 02/25/2020 11:11:16 [name] ([id number]) -------------------------------------------------------------------------------- Encounter Discharge Information Details Patient Name: [name]. Date of Service: 02/25/2020 10:30 AM Medical Record Number: [id number] Patient Account Number: [id number] Date of Birth/Sex: 22-Apr-1931 (86 y.o. F) Treating RN: Army Melia Primary Care Shelsey Rieth: Marisa Hua Other Clinician: Referring Manali Mcelmurry: Marisa Hua Treating Mikhai Bienvenue/Extender: Melburn Hake, HOYT Weeks in Treatment: 5 Encounter Discharge Information Items Discharge Condition: Stable Ambulatory Status: Wheelchair Discharge Destination: Skilled Nursing Facility Telephoned:  No Orders Sent: Yes Transportation: Private Auto Accompanied By: self Schedule Follow-up Appointment: Yes Clinical Summary of Care: Electronic Signature(s) Signed: 02/25/2020 4:16:16 PM By: Army Melia Entered By: Army Melia on 02/25/2020 11:12:16 [name] ([id number]) -------------------------------------------------------------------------------- Lower Extremity Assessment Details Patient Name: [name]. Date of Service: 02/25/2020 10:30 AM Medical Record Number: [id number] Patient Account Number: [id number] Date of Birth/Sex: 13-Apr-1931 (86 y.o. F) Treating RN: Army Melia Primary Care Nael Petrosyan: Marisa Hua Other Clinician: Referring Rosamary Boudreau: Marisa Hua Treating Ahan Eisenberger/Extender: STONE III, HOYT Weeks in Treatment: 5 Edema Assessment Assessed: [Left: No] [Right: No] Edema: [Left: No] [Right: No] Electronic Signature(s) Signed: 02/25/2020 4:16:16 PM By: Army Melia Entered By: Army Melia on 02/25/2020 11:07:16 [name] ([id number]) -------------------------------------------------------------------------------- Multi Wound Chart Details Patient Name: [name]. Date of Service: 02/25/2020 10:30 AM Medical Record Number: [id number] Patient Account Number: [id number] Date of Birth/Sex: 1931-05-11 (86 y.o. F) Treating RN: Army Melia Primary Care Mikala Podoll: Marisa Hua Other Clinician: Referring Lianny Molter: Marisa Hua Treating Gaby Harney/Extender: STONE III, HOYT Weeks in Treatment: 5 Vital Signs Height(in): Pulse(bpm): 104 Weight(lbs): Blood Pressure(mmHg): 145/67 Body Mass Index(BMI): Temperature(F): 98.3 Respiratory Rate(breaths/min): 16 Photos: [N/A:N/A] Wound Location: Right Calcaneus Left Calcaneus N/A Wounding Event: Gradually Appeared Gradually Appeared N/A Primary Etiology: Pressure Ulcer Pressure Ulcer N/A Comorbid History: Cataracts, Hypertension, Peripheral Cataracts, Hypertension, Peripheral N/A Venous  Disease, End Stage Renal Venous Disease, End Stage Renal Disease, History of pressure Disease, History of pressure wounds, Neuropathy wounds, Neuropathy Date Acquired: 12/16/2017 12/16/2017 N/A Weeks of Treatment: 5 5 N/A Wound Status: Open Open N/A Pending Amputation on Yes Yes N/A Presentation: Measurements L x W x D (cm) 3.5x1x0.2 3.5x3x0.2 N/A Area (cm) : 2.749 8.247 N/A Volume (cm) : 0.55 1.649 N/A % Reduction in Area: -2229.70% -27.30% N/A % Reduction in Volume: -4483.30% 15.20% N/A Classification: Category/Stage II Category/Stage III N/A Exudate Amount: Medium Medium N/A Exudate Type: Purulent Serosanguineous N/A  Exudate Color: yellow, brown, green red, brown N/A Wound Margin: Flat and Intact Flat and Intact N/A Granulation Amount: Medium (34-66%) Medium (34-66%) N/A Granulation Quality: Pink Pink N/A Necrotic Amount: Medium (34-66%) Medium (34-66%) N/A Necrotic Tissue: Eschar, Adherent [place] N/A Exposed Structures: Fat Layer (Subcutaneous Tissue) Fat Layer (Subcutaneous Tissue) N/A Exposed: Yes Exposed: Yes Fascia: No Fascia: No Tendon: No Tendon: No Muscle: No Muscle: No Joint: No Joint: No Bone: No Bone: No Epithelialization: Small (1-33%) None N/A Treatment Notes Electronic Signature(s) Signed: 02/25/2020 4:16:16 PM By: Cyndia Bent ([id number]) Entered By: Army Melia on 02/25/2020 11:09:24 [name] ([id number]) -------------------------------------------------------------------------------- [place] Details Patient Name: [name]. Date of Service: 02/25/2020 10:30 AM Medical Record Number: [id number] Patient Account Number: [id number] Date of Birth/Sex: Feb 16, 1931 (86 y.o. F) Treating RN: Army Melia Primary Care Jowanna Loeffler: Marisa Hua Other Clinician: Referring Anthoni Geerts: Marisa Hua Treating Bret Vanessen/Extender: Melburn Hake, HOYT Weeks in Treatment: 5 Active Inactive Orientation to  the Wound Care Program Nursing Diagnoses: Knowledge deficit related to the wound healing center program Goals: Patient/caregiver will verbalize understanding of the [place] [facility] Program Date Initiated: 01/21/2020 Target Resolution Date: 02/22/2020 Goal Status: Active Interventions: Provide education on orientation to the wound center Notes: Pressure Nursing Diagnoses: Knowledge deficit related to causes and risk factors for pressure ulcer development Goals: Patient/caregiver will verbalize risk factors for pressure ulcer development Date Initiated: 01/21/2020 Target Resolution Date: 02/22/2020 Goal Status: Active Interventions: Assess potential for pressure ulcer upon admission and as needed Notes: Wound/Skin Impairment Nursing Diagnoses: Impaired tissue integrity Goals: Ulcer/skin breakdown will have a volume reduction of 30% by week 4 Date Initiated: 01/21/2020 Target Resolution Date: 02/22/2020 Goal Status: Active Interventions: Assess ulceration(s) every visit Notes: Electronic Signature(s) Signed: 02/25/2020 4:16:16 PM By: Army Melia Entered By: Army Melia on 02/25/2020 11:08:45 [name] ([id number]) -------------------------------------------------------------------------------- Pain Assessment Details Patient Name: [name]. Date of Service: 02/25/2020 10:30 AM Medical Record Number: [id number] Patient Account Number: [id number] Date of Birth/Sex: 06-22-1931 (86 y.o. F) Treating RN: Army Melia Primary Care Anira Senegal: Marisa Hua Other Clinician: Referring Nuriya Stuck: Marisa Hua Treating Tallin Hart/Extender: Melburn Hake, HOYT Weeks in Treatment: 5 Active Problems Location of Pain Severity and Description of Pain Patient Has Paino No Site Locations Pain Management and Medication Current Pain Management: Electronic Signature(s) Signed: 02/25/2020 4:16:16 PM By: Army Melia Entered By: Army Melia on 02/25/2020 11:02:02 [name]  ([id number]) -------------------------------------------------------------------------------- Patient/Caregiver Education Details Patient Name: [name]. Date of Service: 02/25/2020 10:30 AM Medical Record Number: [id number] Patient Account Number: [id number] Date of Birth/Gender: [birth year] (86 y.o. F) Treating RN: Army Melia Primary Care Physician: Marisa Hua Other Clinician: Referring Physician: Marisa Hua Treating Physician/Extender: Melburn Hake, HOYT Weeks in Treatment: 5 Education Assessment Education Provided To: Patient Education Topics Provided Wound/Skin Impairment: Handouts: Caring for Your Ulcer Methods: Demonstration, Explain/Verbal Responses: State content correctly Electronic Signature(s) Signed: 02/25/2020 4:16:16 PM By: Army Melia Entered By: Army Melia on 02/25/2020 11:11:29 [name] ([id number]) -------------------------------------------------------------------------------- Wound Assessment Details Patient Name: [name]. Date of Service: 02/25/2020 10:30 AM Medical Record Number: [id number] Patient Account Number: [id number] Date of Birth/Sex: 1931-06-26 (86 y.o. F) Treating RN: Army Melia Primary Care Arnold Depinto: Marisa Hua Other Clinician: Referring Shera Laubach: Marisa Hua Treating Amijah Timothy/Extender: STONE III, HOYT Weeks in Treatment: 5 Wound Status Wound Number: 11 Primary Pressure Ulcer Etiology: Wound Location: Right Calcaneus Wound Open Wounding Event: Gradually Appeared Status: Date Acquired: 12/16/2017 Comorbid Cataracts, Hypertension, Peripheral Venous Disease, End Weeks Of  Treatment: 5 History: Stage Renal Disease, History of pressure wounds, Clustered Wound: No Neuropathy Pending Amputation On Presentation Photos Wound Measurements Length: (cm) 3.5 Width: (cm) 1 Depth: (cm) 0.2 Area: (cm) 2.749 Volume: (cm) 0.55 % Reduction in Area: -2229.7% % Reduction in Volume:  -4483.3% Epithelialization: Small (1-33%) Wound Description Classification: Category/Stage II Foul Wound Margin: Flat and Intact Slou Exudate Amount: Medium Exudate Type: Purulent Exudate Color: yellow, brown, green Odor After Cleansing: No gh/Fibrino Yes Wound Bed Granulation Amount: Medium (34-66%) Exposed Structure Granulation Quality: Pink Fascia Exposed: No Necrotic Amount: Medium (34-66%) Fat Layer (Subcutaneous Tissue) Exposed: Yes Necrotic Quality: Eschar, Adherent Slough Tendon Exposed: No Muscle Exposed: No Joint Exposed: No Bone Exposed: No Treatment Notes Wound #11 (Right Calcaneus) Notes scell, ALLINA, RICHES ([id number]) Electronic Signature(s) Signed: 02/25/2020 4:16:16 PM By: Army Melia Entered By: Army Melia on 02/25/2020 11:06:41 [name] ([id number]) -------------------------------------------------------------------------------- Wound Assessment Details Patient Name: [name]. Date of Service: 02/25/2020 10:30 AM Medical Record Number: [id number] Patient Account Number: [id number] Date of Birth/Sex: 11/11/1930 (86 y.o. F) Treating RN: Army Melia Primary Care Cheetara Hoge: Marisa Hua Other Clinician: Referring Miqueas Whilden: Marisa Hua Treating Brayden Betters/Extender: STONE III, HOYT Weeks in Treatment: 5 Wound Status Wound Number: 12 Primary Pressure Ulcer Etiology: Wound Location: Left Calcaneus Wound Open Wounding Event: Gradually Appeared Status: Date Acquired: 12/16/2017 Comorbid Cataracts, Hypertension, Peripheral Venous Disease, End Weeks Of Treatment: 5 History: Stage Renal Disease, History of pressure wounds, Clustered Wound: No Neuropathy Pending Amputation On Presentation Photos Wound Measurements Length: (cm) 3.5 % Red Width: (cm) 3 % Red Depth: (cm) 0.2 Epith Area: (cm) 8.247 Volume: (cm) 1.649 uction in Area: -27.3% uction in Volume: 15.2% elialization: None Wound Description Classification:  Category/Stage III Foul Wound Margin: Flat and Intact Sloug Exudate Amount: Medium Exudate Type: Serosanguineous Exudate Color: red, brown Odor After Cleansing: No h/Fibrino Yes Wound Bed Granulation Amount: Medium (34-66%) Exposed Structure Granulation Quality: Pink Fascia Exposed: No Necrotic Amount: Medium (34-66%) Fat Layer (Subcutaneous Tissue) Exposed: Yes Necrotic Quality: Adherent Slough Tendon Exposed: No Muscle Exposed: No Joint Exposed: No Bone Exposed: No Treatment Notes Wound #12 (Left Calcaneus) Notes scell, LONETTE, STEVISON ([id number]) Electronic Signature(s) Signed: 02/25/2020 4:16:16 PM By: Army Melia Entered By: Army Melia on 02/25/2020 11:07:03 [name] ([id number]) -------------------------------------------------------------------------------- [place] Details Patient Name: [name]. Date of Service: 02/25/2020 10:30 AM Medical Record Number: [id number] Patient Account Number: [id number] Date of Birth/Sex: 04-20-1931 (86 y.o. F) Treating RN: Army Melia Primary Care Nolyn Eilert: Marisa Hua Other Clinician: Referring Lucia Harm: Marisa Hua Treating Wojciech Willetts/Extender: STONE III, HOYT Weeks in Treatment: 5 Vital Signs Time Taken: 11:01 Temperature (F): 98.3 Pulse (bpm): 67 Respiratory Rate (breaths/min): 16 Blood Pressure (mmHg): 145/67 Reference Range: 80 - 120 mg / dl Electronic Signature(s) Signed: 02/25/2020 4:16:16 PM By: Army Melia Entered By: Army Melia on 02/25/2020 11:01:57

## 2020-03-10 ENCOUNTER — Other Ambulatory Visit: Payer: Self-pay

## 2020-03-10 ENCOUNTER — Encounter: Payer: Medicare Other | Admitting: Physician Assistant

## 2020-03-10 DIAGNOSIS — L89623 Pressure ulcer of left heel, stage 3: Secondary | ICD-10-CM | POA: Diagnosis not present

## 2020-03-10 NOTE — Progress Notes (Addendum)
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 although now with her offloading boots that is no longer the case. She does have evidence of some venous stasis as well which may be complicating the picture and preventing improvement overall. There is no new injury to indicate additional pressure to the site. 11/10/17 she presents today in follow-up evaluation of bilateral heel ulcers. There is improvement noted and these are close to being completely epithelialized. We will continue with current treatment plan with expectation of follow-up next week. 12/01/17-she presents today in follow-up evaluation for bilateral heel ulcers. The left heel appears to have newly formed epithelial tissue with no observable moisture or drainage, the right heel has a miniscule amount of partial-thickness opening. There is no signs of infection. There is concern for pressure to the left lateral foot and left lateral malleolus. The left lateral foot has blanchable erythema and the left lateral malleolus appears to have resolving deep tissue injury. She states she is in the offloading boots 24/7. She is unable to follow-up next week secondary to transportation and will follow up in 2 weeks at which time I anticipate a discharge. 12/22/17-she is here in follow-up evaluation for bilateral heel ulcers. She is currently at [facility] for rehabilitation but will be discharged home tomorrow. She is expressing concerns regarding safety and has been strongly  encouraged to speak with the discharge planner about transition to long-term care since her rehabilitation coverage has been met. The right heel has evidence of new/unrelieved pressure; she states that the offloading boots have been on 24/7 at the facility. The left heel is healed. She will follow up next week 01/10/18 on evaluation today patient appears to be doing fairly well in regard to her heels. She has been tolerating the dressing changes without complication. With that being said patient does have ulcerations noted of the bilateral heels at this point. The right does seem to be worse than left though both wounds required sharp debridement today. She is having no significant discomfort which is great news.  Readmission: 01/21/2020 patient presents today for follow-up concerning her bilateral heel ulcers. I previously have seen her back in 2019 both in clinic as well as in the skilled nursing facility when I was still doing that. Subsequently she unfortunately still has the wounds on the bilateral heels that I saw her for back at that point. She still is at [facility] all fields although apparently the name has changed I am not sure what the new name of the facility is. Nonetheless she does still have the Prevalon offloading boots that she is previously utilized and again that does seem to have been beneficial for her. Said she still has obvious signs of pressure injury to the left heel at this time unfortunately. That does have me somewhat concerned to be perfectly honest. Especially in light of the fact that she tells me she wears the boots at all times I am not sure exactly where the pressure injury is coming from. 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 01/28/2020 upon evaluation today patient appears to be doing well in  general with regard to her heel ulcers. There is much less necrotic tissue as compared to the last evaluation last week. With that being said there does not appear to be any signs of active infection at this time. No fevers, chills, nausea, vomiting, or diarrhea. With that being said there was somewhat of an odor to the left heel it also appears potentially there could be some infection here I am going to see about looking into a culture for her today. 02/11/2020 upon evaluation today patient appears to be doing better in general with regard to her wounds. Fortunately there is no signs of active infection at this time. Unfortunately she does have some blue-green drainage from the right heel this has me concerned about the possibility of Pseudomonas to be honest. The clindamycin that she is currently on would not cover for this. It would need to be something such as Cipro or Levaquin. Nonetheless I think that something we may need to go ahead and start her today apparently she finishes the clindamycin which for some reason after 2 days of the Augmentin she was switched to the patient really does not know why but nonetheless the clindamycin should have been appropriate based on the culture results as well. 02/25/2020 upon evaluation today patient's heels actually appear to be doing well at this point. There is no sign of active infection and overall she seems to be doing quite well which is great news. 03/10/2020 upon evaluation today patient's heels actually do appear to be doing better at this point which is great news. Fortunately there is no evidence of active infection which is also great news. I do not see any signs of worsening and in fact I think that she seems to be doing much better in general in my opinion. Objective Constitutional Well-nourished and well-hydrated in no acute distress. Vitals Time Taken: 11:18 AM, Temperature: 98.2 F, Pulse: 63 bpm, Respiratory Rate: 16 breaths/min, Blood  Pressure: 157/63 mmHg. Respiratory normal breathing without difficulty. [name], [name] ([id number]) Psychiatric this patient is able to make decisions and demonstrates good insight into disease process. Alert and Oriented x 3. pleasant and cooperative. General Notes: Upon inspection patient's wounds actually appear to be showing signs of improvement she had a lot of dry skin  around I was able to mechanically remove some of this fortunately there was no bleeding and really she seems to be managing quite nicely she is using the Prevalon offloading boots which is helpful for her as well. Integumentary (Hair, Skin) Wound #11 status is Open. Original cause of wound was Gradually Appeared. The wound is located on the Right Calcaneus. The wound measures 2.2cm length x 1.3cm width x 0.2cm depth; 2.246cm^2 area and 0.449cm^3 volume. There is Fat Layer (Subcutaneous Tissue) Exposed exposed. There is no tunneling or undermining noted. There is a medium amount of purulent drainage noted. The wound margin is flat and intact. There is medium (34-66%) pink granulation within the wound bed. There is a medium (34-66%) amount of necrotic tissue within the wound bed including Eschar and Adherent Slough. Wound #12 status is Open. Original cause of wound was Gradually Appeared. The wound is located on the Left Calcaneus. The wound measures 1.9cm length x 2.6cm width x 0.2cm depth; 3.88cm^2 area and 0.776cm^3 volume. There is Fat Layer (Subcutaneous Tissue) Exposed exposed. There is no tunneling or undermining noted. There is a medium amount of serosanguineous drainage noted. The wound margin is flat and intact. There is medium (34-66%) pink granulation within the wound bed. There is a medium (34-66%) amount of necrotic tissue within the wound bed including Adherent Slough. Assessment Active Problems ICD-10 Pressure ulcer of left heel, stage 3 Pressure ulcer of right heel, stage 3 Essential (primary)  hypertension Chronic kidney disease, stage 3 unspecified Varicose veins of left lower extremity with ulcer of heel and midfoot Dependence on wheelchair Plan Wound Cleansing: Wound #11 Right Calcaneus: Clean wound with Normal Saline. Clean wound with Normal Saline. Wound #12 Left Calcaneus: Clean wound with Normal Saline. Clean wound with Normal Saline. Primary Wound Dressing: Wound #11 Right Calcaneus: Silver Alginate Wound #12 Left Calcaneus: Silver Alginate Secondary Dressing: Wound #11 Right Calcaneus: ABD and Kerlix/Conform Wound #12 Left Calcaneus: ABD and Kerlix/Conform Dressing Change Frequency: Wound #11 Right Calcaneus: Change dressing every day. Wound #12 Left Calcaneus: Change dressing every day. Follow-up Appointments: Wound #11 Right Calcaneus: Return Appointment in 2 weeks. Wound #12 Left Calcaneus: Return Appointment in 2 weeks. Off-Loading: Wound #11 Right Calcaneus: Heel suspension boot Turn and reposition every 2 hours Wound #12 Left Calcaneus: Heel suspension boot Turn and reposition every 2 hours [name], Danielle S. ([id number]) 1. I am going to suggest currently that the patient go ahead and continue with the current wound care measures including the silver alginate dressings. With that being said I do think that we should wrap her with gauze as opposed to an occlusive dressing as she is somewhat macerated. 2. I am also can recommend at this time that the patient continue with appropriate offloading utilizing the Prevalon offloading boots that is done very well for her. 3. I am also can recommend that the patient continue to offload in general when she is in bed or even sitting to ensure there is no pressure getting to the heels. We will see patient back for reevaluation in 1 week here in the clinic. If anything worsens or changes patient will contact our office for additional recommendations. Electronic Signature(s) Signed: 03/10/2020 1:15:25 PM By:  Worthy Keeler PA-C Entered By: Worthy Keeler on 03/10/2020 13:15:25 [name], [name] ([id number]) -------------------------------------------------------------------------------- SuperBill Details Patient Name: [name] Date of Service: 03/10/2020 Medical Record Number: [id number] Patient Account Number: [id number] Date of Birth/Sex: 08/30/1931 (86 y.o. 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 Z99.3 Dependence on wheelchair Facility Procedures CPT4 Code: [id number] Description: 99213 - WOUND CARE VISIT-LEV 3 EST PT Modifier: Quantity: 1 Physician Procedures CPT4 Code: [id number] Description: 82099 - WC PHYS LEVEL 3 - EST PT Modifier: Quantity: 1 CPT4 Code: Description: ICD-10 Diagnosis Description L89.623 Pressure ulcer of left heel, stage 3 L89.613 Pressure ulcer of right heel, stage 3 I10 Essential (primary) hypertension N18.30 Chronic kidney disease, stage 3 unspecified Modifier: Quantity: Electronic Signature(s) Signed: 03/10/2020 1:15:54 PM By: Worthy Keeler PA-C Entered By: Worthy Keeler on 03/10/2020 13:15:53

## 2020-03-24 ENCOUNTER — Other Ambulatory Visit: Payer: Self-pay

## 2020-03-24 ENCOUNTER — Encounter: Payer: Medicare Other | Attending: Physician Assistant | Admitting: Physician Assistant

## 2020-03-24 DIAGNOSIS — L89613 Pressure ulcer of right heel, stage 3: Secondary | ICD-10-CM | POA: Insufficient documentation

## 2020-03-24 DIAGNOSIS — I83024 Varicose veins of left lower extremity with ulcer of heel and midfoot: Secondary | ICD-10-CM | POA: Diagnosis not present

## 2020-03-24 DIAGNOSIS — Z96653 Presence of artificial knee joint, bilateral: Secondary | ICD-10-CM | POA: Insufficient documentation

## 2020-03-24 DIAGNOSIS — D631 Anemia in chronic kidney disease: Secondary | ICD-10-CM | POA: Diagnosis not present

## 2020-03-24 DIAGNOSIS — L89623 Pressure ulcer of left heel, stage 3: Secondary | ICD-10-CM | POA: Insufficient documentation

## 2020-03-24 DIAGNOSIS — I120 Hypertensive chronic kidney disease with stage 5 chronic kidney disease or end stage renal disease: Secondary | ICD-10-CM | POA: Diagnosis not present

## 2020-03-24 DIAGNOSIS — N183 Chronic kidney disease, stage 3 unspecified: Secondary | ICD-10-CM | POA: Insufficient documentation

## 2020-03-24 DIAGNOSIS — Z993 Dependence on wheelchair: Secondary | ICD-10-CM | POA: Insufficient documentation

## 2020-03-24 NOTE — Progress Notes (Addendum)
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 but this is minimal and only with cleansing of the wound. The left is nontender. No fevers, chills, nausea, or vomiting noted at this time. 10/27/17 on evaluation today patient appears to be doing okay in regard to her bilateral lower extremity ulcers. She has been tolerating the dressing changes fairly well. Unfortunately overall even though she is tolerating this her wound has been very slow to heal. We have previously treated her with Affinity grafts and she did have a lot of good improvement prior to having to be admitted to the hospital. Subsequently since that point she has been maintaining but there has not been a dramatic improvement in the overall wound size. She does have discomfort although this does not seem to be terribly uncomfortable for her at this time. Patient does have definite pressure that is getting to the wound sites or least has in the past although now with her offloading boots that is no longer the case. She does have evidence of some venous stasis as well which may be complicating the picture and preventing improvement overall. There is no new injury to indicate additional pressure to the site. 11/10/17 she presents today in follow-up evaluation of bilateral heel ulcers. There is improvement noted and these are close to being completely epithelialized. We will continue with current treatment plan with expectation of follow-up next week. 12/01/17-she presents today in follow-up  evaluation for bilateral heel ulcers. The left heel appears to have newly formed epithelial tissue with no observable moisture or drainage, the right heel has a miniscule amount of partial-thickness opening. There is no signs of infection. There is concern for pressure to the left lateral foot and left lateral malleolus. The left lateral foot has blanchable erythema and the left lateral malleolus appears to have resolving deep tissue injury. She states she is in the offloading boots 24/7. She is unable to follow-up next week secondary to transportation and will  follow up in 2 weeks at which time I anticipate a discharge. 12/22/17-she is here in follow-up evaluation for bilateral heel ulcers. She is currently at [facility] for rehabilitation but will be discharged home tomorrow. She is expressing concerns regarding safety and has been strongly encouraged to speak with the discharge planner about transition to long-term care since her rehabilitation coverage has been met. The right heel has evidence of new/unrelieved pressure; she states that the offloading boots have been on 24/7 at the facility. The left heel is healed. She will follow up next week 01/10/18 on evaluation today patient appears to be doing fairly well in regard to her heels. She has been tolerating the dressing changes without complication. With that being said patient does have ulcerations noted of the bilateral heels at this point. The right does seem to be worse than left though both wounds required sharp debridement today. She is having no significant discomfort which is great news. Readmission: 01/21/2020 patient presents today for follow-up concerning her bilateral heel ulcers. I previously have seen her back in 2019 both in clinic as well as in the skilled nursing facility when I was still doing that. Subsequently she unfortunately still has the wounds on the bilateral heels that I saw her for back at that point. She still is at  [facility] all fields although apparently the name has changed I am not sure what the new name of the facility is. Nonetheless she does still have the Prevalon offloading boots that she is previously utilized and again that does seem to have been beneficial for her. Said she still has obvious signs of pressure injury to the left heel at this time unfortunately. That does have me somewhat concerned to be perfectly honest. Especially in light of the fact that she tells me she wears the boots at all times I am not sure exactly where the pressure injury is coming from. 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 01/28/2020 upon evaluation today patient appears to be doing well in general with regard to her heel ulcers. There is much less necrotic tissue as compared to the last evaluation last week. With that being said there does not appear to be any signs of active infection at this time. No fevers, chills, nausea, vomiting, or diarrhea. With that being said there was somewhat of an odor to the left heel it also appears potentially there could be some infection here I am going to see about looking into a culture for her today. 02/11/2020 upon evaluation today patient appears to be doing better in general with regard to her wounds. Fortunately there is no signs of active infection at this time. Unfortunately she does have some blue-green drainage from the right heel this has me concerned about the possibility of Pseudomonas to be honest. The clindamycin that she is currently on would not cover for this. It would need to be something such as Cipro or Levaquin. Nonetheless I think that something we may need to go ahead and start her today apparently she finishes the clindamycin which for some reason after 2 days of the Augmentin she was  switched to the patient really does not know why but nonetheless the clindamycin should have been appropriate based on the culture results as well. 02/25/2020 upon evaluation today patient's heels actually appear to be doing  well at this point. There is no sign of active infection and overall she seems to be doing quite well which is great news. 03/10/2020 upon evaluation today patient's heels actually do appear to be doing better at this point which is great news. Fortunately there is no evidence of active infection which is also great news. I do not see any signs of worsening and in fact I think that she seems to be doing much better in general in my opinion. 03/24/2020 upon evaluation today patient actually appears to be doing well in regard to her bilateral heels. 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 Even having seen her some in the skilled nursing facility when I was traveling to [place] as well. Nonetheless she fortunately does not seem to have pain at rest though she did have some pain over the open areas of her heels also appears to be some drainage on the lateral aspect of the right foot right around the heel that I did culture today to see if this is any evidence of cellulitis or if it is just lymphedema/edema buildup. Objective Constitutional Well-nourished and well-hydrated in no acute distress. [name], [name] ([id number]) Vitals Time Taken: 10:40 AM, Temperature: 98.0 F, Pulse: 58 bpm, Respiratory Rate: 16 breaths/min, Blood Pressure: 159/66 mmHg. Respiratory normal breathing without difficulty. Psychiatric this patient is able to make decisions and demonstrates good insight into disease process. Alert and Oriented x 3. pleasant and cooperative. General Notes: Upon inspection patient's heels did have some dry skin there still is  wound openings at both locations but they appear to be doing much better which is great news there is no sign of active infection at this time which is also great news. The patient is having some pain with cleaning of the wounds but nothing severe otherwise. Integumentary (Hair, Skin) Wound #11 status is Open. Original cause of wound was Gradually Appeared. The wound is located on the Right Calcaneus. The wound measures 0.5cm length x 0.5cm width x 0.1cm depth; 0.196cm^2 area and 0.02cm^3 volume. The wound is limited to skin breakdown. There is no tunneling or undermining noted. There is a none present amount of drainage noted. The wound margin is flat and intact. There is no granulation within the wound bed. There is no necrotic tissue within the wound bed. Wound #12 status is Open. Original cause of wound was Gradually Appeared. The wound is located on the Left Calcaneus. The wound measures 1.1cm length x 1cm width x 0.3cm depth; 0.864cm^2 area and 0.259cm^3 volume. There is Fat Layer (Subcutaneous Tissue) Exposed exposed. There is no tunneling or undermining noted. There is a medium amount of serosanguineous drainage noted. The wound margin is flat and intact. There is medium (34-66%) pink granulation within the wound bed. There is a medium (34-66%) amount of necrotic tissue within the wound bed including Adherent Slough. Other Condition(s) Patient presents with Rash / Dermatitis located on the Right Foot. 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 Clean wound with Normal Saline. Wound #12 Left Calcaneus:  Clean wound with Normal Saline. Clean wound with Normal  Saline. Primary Wound Dressing: Wound #11 Right Calcaneus: Silver Alginate Wound #12 Left Calcaneus: Silver Alginate Secondary Dressing: Wound #11 Right Calcaneus: ABD and Kerlix/Conform Wound #12 Left Calcaneus: ABD and Kerlix/Conform Dressing Change Frequency: Wound #11 Right Calcaneus: Change dressing every day. Wound #12 Left Calcaneus: Change dressing every day. Follow-up Appointments: Wound #11 Right Calcaneus: Return Appointment in 2 weeks. Wound #12 Left Calcaneus: [name], [name] ([id number]) Return Appointment in 2 weeks. Off-Loading: Wound #11 Right Calcaneus: Heel suspension boot Turn and reposition every 2 hours Wound #12 Left Calcaneus: Heel suspension boot Turn and reposition every 2 hours Laboratory ordered were: Wound culture routine - Right foot 1. I would recommend currently that we going continue with the silver alginate dressing I think that still doing a good job for her. 2. I would also recommend that she continue with the offloading with regard to both heels and I think using the Prevalon offloading boots for continuation of care is also a good thing to do. 3. I would also recommend that she continue to monitor for any signs of infection we are going to obtain a culture today which I did send for evaluation depend on the results of the culture may make adjustments in her regimen going forward. We will see patient back for reevaluation in 2 weeks here in the clinic. If anything worsens or changes patient will contact our office for additional recommendations. Electronic Signature(s) Signed: 03/24/2020 2:09:37 PM By: Worthy Keeler PA-C Entered By: Worthy Keeler on 03/24/2020 14:09:36 [name] ([id number]) -------------------------------------------------------------------------------- SuperBill Details Patient Name: [name] Date of Service: 03/24/2020 Medical Record Number: [id number] Patient Account Number: [id number] Date of  Birth/Sex: 04-30-1931 (86 y.o. 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 Z99.3 Dependence on wheelchair Facility Procedures CPT4 Code: [id number] Description: 99213 - WOUND CARE VISIT-LEV 3 EST PT Modifier: Quantity: 1 Physician Procedures CPT4 Code: [id number] Description: 64403 - WC PHYS LEVEL 4 - EST PT Modifier: Quantity: 1 CPT4 Code: Description: ICD-10 Diagnosis Description L89.623 Pressure ulcer of left heel, stage 3 L89.613 Pressure ulcer of right heel, stage 3 I10 Essential (primary) hypertension N18.30 Chronic kidney disease, stage 3 unspecified Modifier: Quantity: Electronic Signature(s) Signed: 03/24/2020 2:09:53 PM By: Worthy Keeler PA-C Entered By: Worthy Keeler on 03/24/2020 14:09:52

## 2020-03-25 ENCOUNTER — Other Ambulatory Visit
Admission: RE | Admit: 2020-03-25 | Discharge: 2020-03-25 | Disposition: A | Discharge status: Home / Self Care | Payer: Medicare Other | Source: Ambulatory Visit | Attending: Physician Assistant | Admitting: Physician Assistant

## 2020-03-25 DIAGNOSIS — L089 Local infection of the skin and subcutaneous tissue, unspecified: Secondary | ICD-10-CM | POA: Insufficient documentation

## 2020-03-27 LAB — AEROBIC CULTURE W GRAM STAIN (SUPERFICIAL SPECIMEN)

## 2020-04-07 ENCOUNTER — Encounter: Payer: Medicare Other | Admitting: Physician Assistant

## 2020-04-07 ENCOUNTER — Other Ambulatory Visit: Payer: Self-pay

## 2020-04-07 DIAGNOSIS — L89623 Pressure ulcer of left heel, stage 3: Secondary | ICD-10-CM | POA: Diagnosis not present

## 2020-04-08 NOTE — Progress Notes (Signed)
[name] ([id number]) Visit Report for 04/07/2020 Arrival Information Details Patient Name: [name]. Date of Service: 04/07/2020 9:00 AM Medical Record Number: [id number] Patient Account Number: [id number] Date of Birth/Sex: 1931-03-25 (86 y.o. F) Treating RN: Cornell Barman Primary Care Idalee Foxworthy: Marisa Hua Other Clinician: Referring Hridhaan Yohn: Marisa Hua Treating Aydrien Froman/Extender: Melburn Hake, HOYT Weeks in Treatment: 11 Visit Information History Since Last Visit Added or deleted any medications: No Patient Arrived: Wheel Chair Any new allergies or adverse reactions: No Arrival Time: 10:10 Had a fall or experienced change in No Accompanied By: self activities of daily living that may affect Transfer Assistance: None risk of falls: Patient Identification Verified: Yes Signs or symptoms of abuse/neglect since last visito No Secondary Verification Process Completed: Yes Hospitalized since last visit: No Implantable device outside of the clinic excluding No cellular tissue based products placed in the center since last visit: Has Dressing in Place as Prescribed: Yes Pain Present Now: No Electronic Signature(s) Signed: 04/07/2020 10:18:35 AM By: Sandre Kitty Entered By: Sandre Kitty on 04/07/2020 10:11:20 [name] ([id number]) -------------------------------------------------------------------------------- Encounter Discharge Information Details Patient Name: [name]. Date of Service: 04/07/2020 9:00 AM Medical Record Number: [id number] Patient Account Number: [id number] Date of Birth/Sex: Jul 29, 1931 (86 y.o. F) Treating RN: Cornell Barman Primary Care Raelan Burgoon: Marisa Hua Other Clinician: Referring Javoris Star: Marisa Hua Treating Clorene Nerio/Extender: Melburn Hake, HOYT Weeks in Treatment: 11 Encounter Discharge Information Items Post Procedure Vitals Discharge Condition: Stable Unable to obtain vitals Reason: . Ambulatory Status:  Wheelchair Discharge Destination: Skilled Nursing Facility Telephoned: No Orders Sent: Yes Transportation: Private Auto Accompanied By: self Schedule Follow-up Appointment: Yes Clinical Summary of Care: Electronic Signature(s) Signed: 04/07/2020 12:56:11 PM By: Gretta Cool, BSN, RN, CWS, Kim RN, BSN Entered By: Gretta Cool, BSN, RN, CWS, Kim on 04/07/2020 12:56:11 [name] ([id number]) -------------------------------------------------------------------------------- Lower Extremity Assessment Details Patient Name: [name]. Date of Service: 04/07/2020 9:00 AM Medical Record Number: [id number] Patient Account Number: [id number] Date of Birth/Sex: 1930-12-01 (86 y.o. F) Treating RN: Montey Hora Primary Care Trellis Vanoverbeke: Marisa Hua Other Clinician: Referring Judeth Gilles: Marisa Hua Treating Domitila Stetler/Extender: STONE III, HOYT Weeks in Treatment: 11 Edema Assessment Assessed: [Left: No] [Right: No] Edema: [Left: No] [Right: No] Vascular Assessment Pulses: Dorsalis Pedis Palpable: [Left:Yes] [Right:Yes] Electronic Signature(s) Signed: 04/07/2020 10:20:15 AM By: Montey Hora Entered By: Montey Hora on 04/07/2020 10:20:14 [name] ([id number]) -------------------------------------------------------------------------------- Multi Wound Chart Details Patient Name: [name]. Date of Service: 04/07/2020 9:00 AM Medical Record Number: [id number] Patient Account Number: [id number] Date of Birth/Sex: 04/16/1931 (86 y.o. F) Treating RN: Cornell Barman Primary Care Nishant Schrecengost: Marisa Hua Other Clinician: Referring Seiya Silsby: Marisa Hua Treating Teri Legacy/Extender: Melburn Hake, HOYT Weeks in Treatment: 11 Vital Signs Height(in): Pulse(bpm): 56 Weight(lbs): Blood Pressure(mmHg): 155/63 Body Mass Index(BMI): Temperature(F): 98.2 Respiratory Rate(breaths/min): 18 Photos: [N/A:N/A] Wound Location: Right Calcaneus Left Calcaneus N/A Wounding Event: Gradually  Appeared Gradually Appeared N/A Primary Etiology: Pressure Ulcer Pressure Ulcer N/A Comorbid History: Cataracts, Hypertension, Peripheral Cataracts, Hypertension, Peripheral N/A Venous Disease, End Stage Renal Venous Disease, End Stage Renal Disease, History of pressure Disease, History of pressure wounds, Neuropathy wounds, Neuropathy Date Acquired: 12/16/2017 12/16/2017 N/A Weeks of Treatment: 11 11 N/A Wound Status: Open Open N/A Pending Amputation on Yes Yes N/A Presentation: Measurements L x W x D (cm) 0.1x0.1x0.1 2x1.5x0.2 N/A Area (cm) : 0.008 2.356 N/A Volume (cm) : 0.001 0.471 N/A % Reduction in Area: 93.20% 63.60% N/A % Reduction in Volume: 91.70% 75.80% N/A Classification: Category/Stage II Category/Stage III N/A Exudate Amount:  None Present Medium N/A Exudate Type: N/A Serosanguineous N/A Exudate Color: N/A red, brown N/A Wound Margin: Flat and Intact Flat and Intact N/A Granulation Amount: None Present (0%) Medium (34-66%) N/A Granulation Quality: N/A Pink N/A Necrotic Amount: None Present (0%) Medium (34-66%) N/A Exposed Structures: Fascia: No Fat Layer (Subcutaneous Tissue) N/A Fat Layer (Subcutaneous Tissue) Exposed: Yes Exposed: No Fascia: No Tendon: No Tendon: No Muscle: No Muscle: No Joint: No Joint: No Bone: No Bone: No Limited to Skin Breakdown Epithelialization: Large (67-100%) None N/A Treatment Notes Electronic Signature(s) Signed: 04/07/2020 12:43:50 PM By: Gretta Cool, BSN, RN, CWS, Kim RN, BSN Entered By: Gretta Cool, BSN, RN, CWS, Kim on 04/07/2020 12:43:50 [name] ([name] ([id number]) -------------------------------------------------------------------------------- [place] Details Patient Name: [name]. Date of Service: 04/07/2020 9:00 AM Medical Record Number: [id number] Patient Account Number: [id number] Date of Birth/Sex: 10-May-1931 (86 y.o. F) Treating RN: Cornell Barman Primary Care Huzaifa Viney:  Marisa Hua Other Clinician: Referring Nickolus Wadding: Marisa Hua Treating Feras Gardella/Extender: Melburn Hake, HOYT Weeks in Treatment: 11 Active Inactive Orientation to the Wound Care Program Nursing Diagnoses: Knowledge deficit related to the wound healing center program Goals: Patient/caregiver will verbalize understanding of the [facility] Date Initiated: 01/21/2020 Target Resolution Date: 02/22/2020 Goal Status: Active Interventions: Provide education on orientation to the wound center Notes: Pressure Nursing Diagnoses: Knowledge deficit related to causes and risk factors for pressure ulcer development Goals: Patient/caregiver will verbalize risk factors for pressure ulcer development Date Initiated: 01/21/2020 Target Resolution Date: 02/22/2020 Goal Status: Active Interventions: Assess potential for pressure ulcer upon admission and as needed Notes: Wound/Skin Impairment Nursing Diagnoses: Impaired tissue integrity Goals: Ulcer/skin breakdown will have a volume reduction of 30% by week 4 Date Initiated: 01/21/2020 Target Resolution Date: 02/22/2020 Goal Status: Active Interventions: Assess ulceration(s) every visit Notes: Electronic Signature(s) Signed: 04/07/2020 12:43:38 PM By: Gretta Cool, BSN, RN, CWS, Kim RN, BSN Previous Signature: 04/07/2020 12:04:24 PM Version By: Gretta Cool, BSN, RN, CWS, Kim RN, BSN Entered By: Gretta Cool, BSN, RN, CWS, Kim on 04/07/2020 12:43:37 [name] ([id number]) -------------------------------------------------------------------------------- Pain Assessment Details Patient Name: [name]. Date of Service: 04/07/2020 9:00 AM Medical Record Number: [id number] Patient Account Number: [id number] Date of Birth/Sex: 01/29/1931 (86 y.o. F) Treating RN: Cornell Barman Primary Care Dyanna Seiter: Marisa Hua Other Clinician: Referring Gaberiel Youngblood: Marisa Hua Treating Anita Mcadory/Extender: Melburn Hake, HOYT Weeks in Treatment: 11 Active  Problems Location of Pain Severity and Description of Pain Patient Has Paino No Site Locations Pain Management and Medication Current Pain Management: Electronic Signature(s) Signed: 04/07/2020 10:18:35 AM By: Sandre Kitty Signed: 04/07/2020 4:54:26 PM By: Gretta Cool, BSN, RN, CWS, Kim RN, BSN Entered By: Sandre Kitty on 04/07/2020 10:14:30 [name] ([id number]) -------------------------------------------------------------------------------- Patient/Caregiver Education Details Patient Name: [name] Date of Service: 04/07/2020 9:00 AM Medical Record Number: [id number] Patient Account Number: [id number] Date of Birth/Gender: May 20, 1931 (86 y.o. F) Treating RN: Cornell Barman Primary Care Physician: Marisa Hua Other Clinician: Referring Physician: Marisa Hua Treating Physician/Extender: Sharalyn Ink in Treatment: 11 Education Assessment Education Provided To: Patient Education Topics Provided Welcome To [facility]: Wound/Skin Impairment: Handouts: Caring for Your Ulcer Methods: Demonstration, Explain/Verbal Responses: State content correctly Electronic Signature(s) Signed: 04/07/2020 4:54:26 PM By: Gretta Cool, BSN, RN, CWS, Kim RN, BSN Entered By: Gretta Cool, BSN, RN, CWS, Kim on 04/07/2020 12:55:10 [name] ([id number]) -------------------------------------------------------------------------------- Wound Assessment Details Patient Name: [name]. Date of Service: 04/07/2020 9:00 AM Medical Record Number: [id number] Patient Account Number: [id number] Date of  Birth/Sex: 1931/02/21 (86 y.o. F) Treating RN: Cornell Barman Primary Care Jouri Threat: Marisa Hua Other Clinician: Referring Zaydenn Balaguer: Marisa Hua Treating Dorothyann Mourer/Extender: Melburn Hake, HOYT Weeks in Treatment: 11 Wound Status Wound Number: 11 Primary Pressure Ulcer Etiology: Wound Location: Right Calcaneus Wound Open Wounding Event: Gradually  Appeared Status: Date Acquired: 12/16/2017 Comorbid Cataracts, Hypertension, Peripheral Venous Disease, End Weeks Of Treatment: 11 History: Stage Renal Disease, History of pressure wounds, Clustered Wound: No Neuropathy Pending Amputation On Presentation Photos Wound Measurements Length: (cm) 0.1 % Red Width: (cm) 0.1 % Red Depth: (cm) 0.1 Epith Area: (cm) 0.008 Tunn Volume: (cm) 0.001 Unde uction in Area: 93.2% uction in Volume: 91.7% elialization: Large (67-100%) eling: No rmining: No Wound Description Classification: Category/Stage II Foul Wound Margin: Flat and Intact Sloug Exudate Amount: None Present Odor After Cleansing: No h/Fibrino No Wound Bed Granulation Amount: None Present (0%) Exposed Structure Necrotic Amount: None Present (0%) Fascia Exposed: No Fat Layer (Subcutaneous Tissue) Exposed: No Tendon Exposed: No Muscle Exposed: No Joint Exposed: No Bone Exposed: No Limited to Skin Breakdown Treatment Notes Wound #11 (Right Calcaneus) Notes scell, ABD, conform Electronic Signature(s) Signed: 04/07/2020 10:19:28 AM By: Lianne Moris ([id number]) Signed: 04/07/2020 4:54:26 PM By: Gretta Cool, BSN, RN, CWS, Kim RN, BSN Previous Signature: 04/07/2020 10:18:35 AM Version By: Sandre Kitty Entered By: Montey Hora on 04/07/2020 10:19:28 [name] ([id number]) -------------------------------------------------------------------------------- Wound Assessment Details Patient Name: [name]. Date of Service: 04/07/2020 9:00 AM Medical Record Number: [id number] Patient Account Number: [id number] Date of Birth/Sex: 27-May-1931 (86 y.o. F) Treating RN: Cornell Barman Primary Care Silverio Hagan: Marisa Hua Other Clinician: Referring Imagine Nest: Marisa Hua Treating Lillie Bollig/Extender: STONE III, HOYT Weeks in Treatment: 11 Wound Status Wound Number: 12 Primary Pressure Ulcer Etiology: Wound Location: Left Calcaneus Wound Open Wounding  Event: Gradually Appeared Status: Date Acquired: 12/16/2017 Comorbid Cataracts, Hypertension, Peripheral Venous Disease, End Weeks Of Treatment: 11 History: Stage Renal Disease, History of pressure wounds, Clustered Wound: No Neuropathy Pending Amputation On Presentation Photos Wound Measurements Length: (cm) 2 % Redu Width: (cm) 1.5 % Redu Depth: (cm) 0.2 Epithe Area: (cm) 2.356 Tunne Volume: (cm) 0.471 Under ction in Area: 63.6% ction in Volume: 75.8% lialization: None ling: No mining: No Wound Description Classification: Category/Stage III Foul O Wound Margin: Flat and Intact Slough Exudate Amount: Medium Exudate Type: Serosanguineous Exudate Color: red, brown dor After Cleansing: No /Fibrino Yes Wound Bed Granulation Amount: Medium (34-66%) Exposed Structure Granulation Quality: Pink Fascia Exposed: No Necrotic Amount: Medium (34-66%) Fat Layer (Subcutaneous Tissue) Exposed: Yes Necrotic Quality: Adherent Slough Tendon Exposed: No Muscle Exposed: No Joint Exposed: No Bone Exposed: No Treatment Notes Wound #12 (Left Calcaneus) Notes scell, ABD, conform MATHILDA, MAGUIRE ([id number]) Electronic Signature(s) Signed: 04/07/2020 10:19:50 AM By: Montey Hora Signed: 04/07/2020 4:54:26 PM By: Gretta Cool, BSN, RN, CWS, Kim RN, BSN Previous Signature: 04/07/2020 10:18:35 AM Version By: Sandre Kitty Entered By: Montey Hora on 04/07/2020 10:19:49 [name] ([id number]) -------------------------------------------------------------------------------- Vitals Details Patient Name: [name]. Date of Service: 04/07/2020 9:00 AM Medical Record Number: [id number] Patient Account Number: [id number] Date of Birth/Sex: [birth year] (86 y.o. F) Treating RN: Cornell Barman Primary Care Meredith Kilbride: Marisa Hua Other Clinician: Referring Arpita Fentress: Marisa Hua Treating Corry Ihnen/Extender: Melburn Hake, HOYT Weeks in Treatment: 11 Vital Signs Time Taken:  08:15 Temperature (F): 98.2 Pulse (bpm): 56 Respiratory Rate (breaths/min): 18 Blood Pressure (mmHg): 155/63 Reference Range: 80 - 120 mg / dl Electronic Signature(s) Signed: 04/07/2020 10:18:35 AM By: Sandre Kitty Entered By: Sandre Kitty on 04/07/2020  10:14:25 

## 2020-04-08 NOTE — Progress Notes (Signed)
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 but we did not have a vendor supplied sample today to apply to the right heel. We will try for next week. 04/08/17 we did have the affinity sample available for this patient's application today in regard to the right heel. This appears to be healing well and we are going to continue with application at this point. There is no evidence of infection in the left heel is also doing better. 04/14/2017-- the patient had a total of 8 applications of Affinity to her right heel and the vendor samples are done. As far as her left heel goes we will check with the vendor to see if there are any samples available. 04/28/17 on evaluation today patient heels bilaterally appear to be doing okay although there is slough covering both wounds. She has continued to do about the same over several weeks when it comes to her bilateral heels. She is tolerating the dressing changes and has only minimal discomfort. 05/26/17 on evaluation today patient appears to be doing well in regard to her bilateral heal wounds. The right heel wound in particular is doing very well and is much smaller of the left heel wound is slowly progressing. She has been tolerating the dressing changes without complication. No fevers, chills, nausea, or vomiting noted at this time. [name], [name] ([id number]) 06/02/17 on evaluation today patient's wounds appeared to be doing about the same.  She does not have any significant overall improvement of her wounds at this point. They also do not appear to be significantly worse which is good news. She is having some discomfort in regard to the right lower extremity but this is minimal and only with cleansing of the wound. The left is nontender. No fevers, chills, nausea, or vomiting noted at this time. 10/27/17 on evaluation today patient appears to be doing okay in regard to her bilateral lower extremity ulcers. She has been tolerating the dressing changes fairly well. Unfortunately overall even though she is tolerating this her wound has been very slow to heal. We have previously treated her with Affinity grafts and she did have a lot of good improvement prior to having to be admitted to the hospital. Subsequently since that  point she has been maintaining but there has not been a dramatic improvement in the overall wound size. She does have discomfort although this does not seem to be terribly uncomfortable for her at this time. Patient does have definite pressure that is getting to the wound sites or least has in the past although now with her offloading boots that is no longer the case. She does have evidence of some venous stasis as well which may be complicating the picture and preventing improvement overall. There is no new injury to indicate additional pressure to the site. 11/10/17 she presents today in follow-up evaluation of bilateral heel ulcers. There is improvement noted and these are close to being completely epithelialized. We will continue with current treatment plan with expectation of follow-up next week. 12/01/17-she presents today in follow-up evaluation for bilateral heel ulcers. The left heel appears to have newly formed epithelial tissue with no observable moisture or drainage, the right heel has a miniscule amount of partial-thickness opening. There is no signs of infection. There is concern for pressure to the left  lateral foot and left lateral malleolus. The left lateral foot has blanchable erythema and the left lateral malleolus appears to have resolving deep tissue injury. She states she is in the offloading boots 24/7. She is unable to follow-up next week secondary to transportation and will follow up in 2 weeks at which time I anticipate a discharge. 12/22/17-she is here in follow-up evaluation for bilateral heel ulcers. She is currently at [facility] for rehabilitation but will be discharged home tomorrow. She is expressing concerns regarding safety and has been strongly encouraged to speak with the discharge planner about transition to long-term care since her rehabilitation coverage has been met. The right heel has evidence of new/unrelieved pressure; she states that the offloading boots have been on 24/7 at the facility. The left heel is healed. She will follow up next week 01/10/18 on evaluation today patient appears to be doing fairly well in regard to her heels. She has been tolerating the dressing changes without complication. With that being said patient does have ulcerations noted of the bilateral heels at this point. The right does seem to be worse than left though both wounds required sharp debridement today. She is having no significant discomfort which is great news. Readmission: 01/21/2020 patient presents today for follow-up concerning her bilateral heel ulcers. I previously have seen her back in 2019 both in clinic as well as in the skilled nursing facility when I was still doing that. Subsequently she unfortunately still has the wounds on the bilateral heels that I saw her for back at that point. She still is at [facility] although apparently the name has changed I am not sure what the new name of the facility is. Nonetheless she does still have the [employer] offloading boots that she is previously utilized and again that does seem to have been beneficial for her. Said she  still has obvious signs of pressure injury to the left heel at this time unfortunately. That does have me somewhat concerned to be perfectly honest. Especially in light of the fact that she tells me she wears the boots at all times I am not sure exactly where the pressure injury is coming from. 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 01/28/2020 upon evaluation today patient  appears to be doing well in general with regard to her heel ulcers. There is much less necrotic tissue as compared to the last evaluation last week. With that being said there does not appear to be any signs of active infection at this time. No fevers, chills, nausea, vomiting, or diarrhea. With that being said there was somewhat of an odor to the left heel it also appears potentially there could be some infection here I am going to see about looking into a culture for her today. 02/11/2020 upon evaluation today patient appears to be doing better in general with regard to her wounds. Fortunately there is no signs of active infection at this time. Unfortunately she does have some blue-green drainage from the right heel this has me concerned about the possibility of Pseudomonas to be honest. The clindamycin that she is currently on would not cover for this. It would need to be something such as Cipro or Levaquin. Nonetheless I think that something we may need to go ahead and start her today apparently she finishes the clindamycin which for some reason after 2 days of the Augmentin she was switched to the patient really does not know why but nonetheless the clindamycin should have been appropriate based on the culture results as well. 02/25/2020 upon evaluation today patient's heels actually appear to be doing well at this point. There is no sign of active infection and  overall she seems to be doing quite well which is great news. 03/10/2020 upon evaluation today patient's heels actually do appear to be doing better at this point which is great news. Fortunately there is no evidence of active infection which is also great news. I do not see any signs of worsening and in fact I think that she seems to be doing much better in general in my opinion. 03/24/2020 upon evaluation today patient actually appears to be doing well in regard to her bilateral heels. 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 Even having seen her some in the skilled nursing facility when I was traveling to [place] as well. Nonetheless she fortunately does not seem to have pain at rest though she did have some pain over the open areas of her heels also appears to be some drainage on the lateral aspect of the right foot right around the heel that I did culture today to see if this is any evidence of cellulitis or if it is just lymphedema/edema buildup. 04/07/2020 upon evaluation today patient appears to be doing well with regard to her wounds. She still has wounds open on both heels but this is minimal at this point. She also is on the correct antibiotics now for her MRSA infection that she had of the right heel. I did send that in as an order to her facility and they argue to be starting her on that. That is can be doxycycline that I placed her on this was sensitive to that. Fortunately that she do excellent for her as far as try to get this infection under much better control. [name], [name] ([id number]) Objective Constitutional Well-nourished and well-hydrated in no acute distress. Vitals Time Taken: 8:15 AM, Temperature: 98.2 F, Pulse: 56 bpm, Respiratory Rate: 18 breaths/min, Blood Pressure: 155/63 mmHg. Respiratory normal breathing without  difficulty. Psychiatric this patient is able  to make decisions and demonstrates good insight into disease process. Alert and Oriented x 3. pleasant and cooperative. General Notes: Upon inspection patient's wound bed actually showed signs of good granulation at this time. There does not appear to be any evidence of active infection which is great news. Overall very pleased with where things stand today. No fevers, chills, nausea, vomiting, or diarrhea. Integumentary (Hair, Skin) Wound #11 status is Open. Original cause of wound was Gradually Appeared. The wound is located on the Right Calcaneus. The wound measures 0.1cm length x 0.1cm width x 0.1cm depth; 0.008cm^2 area and 0.001cm^3 volume. The wound is limited to skin breakdown. There is no tunneling or undermining noted. There is a none present amount of drainage noted. The wound margin is flat and intact. There is no granulation within the wound bed. There is no necrotic tissue within the wound bed. Wound #12 status is Open. Original cause of wound was Gradually Appeared. The wound is located on the Left Calcaneus. The wound measures 2cm length x 1.5cm width x 0.2cm depth; 2.356cm^2 area and 0.471cm^3 volume. There is Fat Layer (Subcutaneous Tissue) Exposed exposed. There is no tunneling or undermining noted. There is a medium amount of serosanguineous drainage noted. The wound margin is flat and intact. There is medium (34-66%) pink granulation within the wound bed. There is a medium (34-66%) amount of necrotic tissue within the wound bed including Adherent Slough. 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 There was a Selective/Open Wound Skin/Epidermis Debridement with a total area of 3 sq cm performed by STONE III, Amaree Loisel E., PA-C. With the following instrument(s): Curette to remove Non- Viable tissue/material. Material removed includes Eschar and Skin: Epidermis and. No specimens were taken. A time out was conducted at 09:30, prior to the start of the procedure. A Minimum amount of bleeding was controlled with Pressure. The procedure was tolerated well. Post Debridement Measurements: 0.5cm length x 0.5cm width x 0.1cm depth; 0.02cm^3 volume. Post debridement Stage noted as Category/Stage III. Character of Wound/Ulcer Post Debridement is stable. Post procedure Diagnosis Wound #12: Same as Pre-Procedure Plan Wound Cleansing: Wound #11 Right Calcaneus: Clean wound with Normal Saline. [name], [name] ([id number]) Clean wound with Normal Saline. Wound #12 Left Calcaneus: Clean wound with Normal Saline. Clean wound with Normal Saline. Primary Wound Dressing: Wound #11 Right Calcaneus: Silver Alginate Wound #12 Left Calcaneus: Silver Alginate Secondary Dressing: Wound #11 Right Calcaneus: ABD and Kerlix/Conform Wound #12 Left Calcaneus: ABD and Kerlix/Conform Dressing Change Frequency: Wound #11 Right Calcaneus: Change dressing every day. Wound #12 Left Calcaneus: Change dressing every day. Follow-up Appointments: Wound #11 Right Calcaneus: Return Appointment in 2 weeks. Wound #12 Left Calcaneus: Return Appointment in 2 weeks. Off-Loading: Wound #11 Right Calcaneus: Heel suspension boot Turn and reposition every 2 hours Wound #12 Left Calcaneus: Heel suspension boot Turn and reposition every 2 hours 1 I recommend patient continue with the doxycycline at this time I think that still the best option for her. 2. I am also can recommend that she continue to monitor for any signs of infection at the facility worsening although I think she will do well now that she is  on the doxycycline. 3. She  does need to continue with appropriate offloading as well which has been done up to this point. We will see patient back for reevaluation in 1 week here in the clinic. If anything worsens or changes patient will contact our office for additional recommendations. Electronic Signature(s) Signed: 04/07/2020 4:05:34 PM By: Worthy Keeler PA-C Entered By: Worthy Keeler on 04/07/2020 16:05:33 [name], [name] ([id number]) -------------------------------------------------------------------------------- SuperBill Details Patient Name: [name] Date of Service: 04/07/2020 Medical Record Number: [id number] Patient Account Number: [id number] Date of Birth/Sex: 04-13-1931 (86 y.o. 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 Z99.3 Dependence on wheelchair Facility Procedures CPT4 Code: [id number] Description: 97026 - DEBRIDE WOUND 1ST 20 SQ CM OR < Modifier: Quantity: 1 CPT4 Code: Description: ICD-10 Diagnosis Description L89.613 Pressure ulcer of right heel, stage 3 Modifier: Quantity: Physician Procedures CPT4 Code: [id number] Description: 02774 - WC PHYS DEBR WO ANESTH 20 SQ CM Modifier: Quantity: 1 CPT4 Code: Description: ICD-10 Diagnosis Description L89.613 Pressure ulcer of right heel, stage 3 Modifier: Quantity: Electronic Signature(s) Signed: 04/07/2020 4:06:10 PM By: Worthy Keeler PA-C Entered By: Worthy Keeler on 04/07/2020 16:06:10

## 2020-04-22 ENCOUNTER — Ambulatory Visit: Payer: Medicare Other | Admitting: Physician Assistant

## 2020-04-23 ENCOUNTER — Encounter: Payer: Medicare Other | Attending: Internal Medicine | Admitting: Internal Medicine

## 2020-04-23 ENCOUNTER — Other Ambulatory Visit: Payer: Self-pay

## 2020-04-23 DIAGNOSIS — I120 Hypertensive chronic kidney disease with stage 5 chronic kidney disease or end stage renal disease: Secondary | ICD-10-CM | POA: Diagnosis not present

## 2020-04-23 DIAGNOSIS — D631 Anemia in chronic kidney disease: Secondary | ICD-10-CM | POA: Insufficient documentation

## 2020-04-23 DIAGNOSIS — G629 Polyneuropathy, unspecified: Secondary | ICD-10-CM | POA: Insufficient documentation

## 2020-04-23 DIAGNOSIS — L89613 Pressure ulcer of right heel, stage 3: Secondary | ICD-10-CM | POA: Diagnosis not present

## 2020-04-23 DIAGNOSIS — Z7401 Bed confinement status: Secondary | ICD-10-CM | POA: Diagnosis not present

## 2020-04-23 DIAGNOSIS — Z96653 Presence of artificial knee joint, bilateral: Secondary | ICD-10-CM | POA: Insufficient documentation

## 2020-04-23 DIAGNOSIS — N186 End stage renal disease: Secondary | ICD-10-CM | POA: Insufficient documentation

## 2020-04-23 DIAGNOSIS — Z87820 Personal history of traumatic brain injury: Secondary | ICD-10-CM | POA: Diagnosis not present

## 2020-04-23 DIAGNOSIS — L89623 Pressure ulcer of left heel, stage 3: Secondary | ICD-10-CM | POA: Insufficient documentation

## 2020-04-23 DIAGNOSIS — Z993 Dependence on wheelchair: Secondary | ICD-10-CM | POA: Diagnosis not present

## 2020-04-23 NOTE — Progress Notes (Signed)
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 point. They also do not appear to be significantly worse which is good news. She is having some discomfort in regard to the right lower extremity but this is minimal and only with cleansing of the wound. The left is nontender. No fevers, chills, nausea, or vomiting noted at this time. [name], [name] ([id number]) 10/27/17 on evaluation today patient appears to be doing okay in regard to her bilateral lower extremity ulcers. She has been tolerating the dressing changes fairly well. Unfortunately overall even though she is tolerating this her wound has been very slow to heal. We have previously treated her with Affinity grafts and she did have a lot of good improvement prior to having to be admitted to the hospital. Subsequently since that point she has been maintaining but there has not been a dramatic improvement in the overall wound size. She does have discomfort although this does not seem to be terribly uncomfortable for her at this time. Patient does have definite pressure that is getting to the wound sites or least has in the past although now with her offloading boots that is no longer the case. She does have evidence of some venous stasis as well which may be complicating the picture and preventing improvement overall. There is no new injury to indicate additional pressure to the site. 11/10/17 she presents today in  follow-up evaluation of bilateral heel ulcers. There is improvement noted and these are close to being completely epithelialized. We will continue with current treatment plan with expectation of follow-up next week.  12/01/17-she presents today in follow-up evaluation for bilateral heel ulcers. The left heel appears to have newly formed epithelial tissue with no observable moisture or drainage, the right heel has a miniscule amount of partial-thickness opening. There is no signs of infection. There is concern for pressure to the left lateral foot and left lateral malleolus. The left lateral foot has blanchable erythema and the left lateral malleolus appears to have resolving deep tissue injury. She states she is in the offloading boots 24/7. She is unable to follow-up next week secondary to transportation and will follow up in 2 weeks at which time I anticipate a discharge. 12/22/17-she is here in follow-up evaluation for bilateral heel ulcers. She is currently at [facility] for rehabilitation but will be discharged home tomorrow. She is expressing concerns regarding safety and has been strongly encouraged to speak with the discharge planner about transition to long-term care since her rehabilitation coverage has been met. The right heel has evidence of new/unrelieved pressure; she states that the offloading boots have been on 24/7 at the facility. The left heel is healed. She will follow up next week 01/10/18 on evaluation today patient appears to be doing fairly well in regard to her heels. She has been tolerating the dressing changes without complication. With that being said patient does have ulcerations noted of the bilateral heels at this point. The right does seem to be worse than left though both wounds required sharp debridement today. She is having no significant discomfort which is great news. Readmission: 01/21/2020 patient presents today for follow-up concerning her bilateral heel ulcers. I  previously have seen her back in 2019 both in clinic as well as in the skilled nursing facility when I was still doing that. Subsequently she unfortunately still has the wounds on the bilateral heels that I saw her for back at that point. She still is at [facility] although apparently the name has changed I am not sure what the new name of the facility is. Nonetheless she does still have the [employer] offloading boots that she is previously utilized and again that does seem to have been beneficial for her. Said she still has obvious signs of pressure injury to the left heel at this time unfortunately. That does have me somewhat concerned to be perfectly honest. Especially in light of the fact that she tells me she wears the boots at all times I am not sure exactly where the pressure injury is coming from. 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 01/28/2020 upon evaluation today patient appears to be doing well in general with regard to her heel ulcers. There is much less necrotic tissue as compared to the last evaluation last week. With that being said there does not appear to be any signs of active infection at this time. No fevers, chills, nausea, vomiting, or diarrhea. With that being said there was somewhat of an odor to the left heel it also appears potentially there could be some infection here I am going to see about looking into a culture for her today. 02/11/2020 upon evaluation today patient appears to be doing better in general with regard to her wounds. Fortunately there is no signs of active infection at this time. Unfortunately she does have some blue-green drainage from the right heel this has me concerned about the possibility  of Pseudomonas to be honest. The clindamycin that she is currently on would not  cover for this. It would need to be something such as Cipro or Levaquin. Nonetheless I think that something we may need to go ahead and start her today apparently she finishes the clindamycin which for some reason after 2 days of the Augmentin she was switched to the patient really does not know why but nonetheless the clindamycin should have been appropriate based on the culture results as well. 02/25/2020 upon evaluation today patient's heels actually appear to be doing well at this point. There is no sign of active infection and overall she seems to be doing quite well which is great news. 03/10/2020 upon evaluation today patient's heels actually do appear to be doing better at this point which is great news. Fortunately there is no evidence of active infection which is also great news. I do not see any signs of worsening and in fact I think that she seems to be doing much better in general in my opinion. 03/24/2020 upon evaluation today patient actually appears to be doing well in regard to her bilateral heels. 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 Even having seen her some in the skilled nursing facility when I was traveling to [place] as well. Nonetheless she fortunately does not seem to have pain at rest though she did have some pain over the open areas of her heels also appears to be some drainage on the lateral aspect of the right foot right around the heel that I did culture today to see if this is any evidence of cellulitis or if it is just lymphedema/edema buildup. 04/07/2020 upon evaluation today patient appears to be doing well with regard to her wounds. She still has wounds open on both heels but this is minimal at this point. She also is on the correct antibiotics now for her MRSA infection that she had of the right heel. I did send that  in as an order to her facility and they argue to be starting her on that. That is can be doxycycline that I placed her on this was sensitive to that. Fortunately that she do excellent for her as far as try to get this infection under much better control. 7/7; patient I am not really that familiar with. She is wheelchair dependent and per description he has had chronic pressure ulcers on her bilateral heels for the past 2 to 3 years. She has been using silver alginate to the wound. She finished antibiotics that were prescribed for her last month. Today I cannot identify an open area on the right heel. 2 small areas on the Achilles area of the left heel are still open but almost closed. She is wearing big offloading soft boots Objective [name], [name]. ([id number]) Constitutional Patient is hypertensive.. Pulse regular and within target range for patient.Marland Kitchen Respirations regular, non-labored and within target range.. Temperature is normal and within the target range for the patient.Marland Kitchen appears in no distress. Vitals Time Taken: 9:17 AM, Temperature: 98.0 F, Pulse: 66 bpm, Respiratory Rate: 16 breaths/min, Blood Pressure: 169/61 mmHg. Cardiovascular Both posterior tibial and dorsalis pedis pulses are palpable bilaterally. General Notes: Wound exam; I cannot find anything open on the right heel. Skin is very dry flakes off easily but  no open wound On the left side near the tip of the heel 2 small open areas that are not fully epithelialized. Again everything is dry flaking. No evidence of infection Integumentary (Hair, Skin) Wound #11 status is Open. Original cause of wound was Gradually Appeared. The wound is located on the Right Calcaneus. The wound measures 1cm length x 1cm width x 0.1cm depth; 0.785cm^2 area and 0.079cm^3 volume. The wound is limited to skin breakdown. There is a none present amount of drainage noted. The wound margin is flat and intact. There is no granulation within the wound bed.  There is no necrotic tissue within the wound bed. Wound #12 status is Open. Original cause of wound was Gradually Appeared. The wound is located on the Left Calcaneus. The wound measures 0.5cm length x 0.5cm width x 0.2cm depth; 0.196cm^2 area and 0.039cm^3 volume. There is Fat Layer (Subcutaneous Tissue) Exposed exposed. There is a medium amount of serosanguineous drainage noted. The wound margin is flat and intact. There is medium (34-66%) pink granulation within the wound bed. There is a medium (34-66%) amount of necrotic tissue within the wound bed including Adherent Slough. 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 Clean wound with Normal Saline. May Shower, gently pat wound dry prior to applying new dressing. Wound #12 Left Calcaneus: Clean wound with Normal Saline. Clean wound with Normal Saline. May Shower, gently pat wound dry prior to applying new dressing. Skin Barriers/Peri-Wound Care: Wound #11 Right Calcaneus: Moisturizing lotion - Moisturize daily with Cetafil creme Wound #12 Left Calcaneus: Moisturizing lotion - Moisturize daily with Cetafil creme Primary Wound Dressing: Wound #11 Right Calcaneus: Silver Alginate Wound #12 Left Calcaneus: Silver Alginate Secondary Dressing: Wound #11 Right Calcaneus: ABD and Kerlix/Conform Wound #12 Left Calcaneus: ABD and Kerlix/Conform Dressing Change Frequency: Wound #11 Right Calcaneus: Change Dressing Monday, Wednesday, Friday Wound #12 Left Calcaneus: Change Dressing Monday, Wednesday, Friday [name], [name] ([id number]) Follow-up Appointments: Wound #11 Right Calcaneus: Return Appointment in 2  weeks. Wound #12 Left Calcaneus: Return Appointment in 2 weeks. Edema Control: Wound #11 Right Calcaneus: Elevate legs to the level of the heart and pump ankles as often as possible Wound #12 Left Calcaneus: Elevate legs to the level of the heart and pump ankles as often as possible Off-Loading: Wound #11 Right Calcaneus: Heel suspension boot Turn and reposition every 2 hours Wound #12 Left Calcaneus: Heel suspension boot Turn and reposition every 2 hours #1 I will continue with the silver alginate on the left, ABDs Kerlix/conform 2. I think the right heel only requires offloading 3. I saw no evidence of infection or ischemia 4. I think she will be wearing her offloading Velcro boots indefinitely. I would further recommend the facility lotion her skin in her lower legs and feet every night. Exceptionally dry Electronic Signature(s) Signed: 04/23/2020 4:15:00 PM By: Linton Ham MD Entered By: Linton Ham on 04/23/2020 09:46:07 [name] ([id number]) -------------------------------------------------------------------------------- SuperBill Details Patient Name: [name] Date of Service: 04/23/2020 Medical Record Number: [id number] Patient Account Number: [id number] Date of Birth/Sex: 09-06-1931 (86 y.o. 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

## 2020-04-24 NOTE — Progress Notes (Signed)
AYLYN, WENZLER ([id number]) Visit Report for 04/23/2020 Arrival Information Details Patient Name: [name], [name]. Date of Service: 04/23/2020 9:00 AM Medical Record Number: [id number] Patient Account Number: [id number] Date of Birth/Sex: Oct 09, 1931 (86 y.o. F) Treating RN: Army Melia Primary Care Tristan Bramble: Marisa Hua Other Clinician: Referring Saumya Hukill: Marisa Hua Treating Shahil Speegle/Extender: Tito Dine in Treatment: 13 Visit Information History Since Last Visit Added or deleted any medications: No Patient Arrived: Wheel Chair Any new allergies or adverse reactions: No Arrival Time: 09:17 Had a fall or experienced change in No Accompanied By: self activities of daily living that may affect Transfer Assistance: None risk of falls: Signs or symptoms of abuse/neglect since last visito No Hospitalized since last visit: No Has Dressing in Place as Prescribed: Yes Pain Present Now: No Electronic Signature(s) Signed: 04/24/2020 10:39:21 AM By: Army Melia Entered By: Army Melia on 04/23/2020 09:17:26 [name] ([id number]) -------------------------------------------------------------------------------- Clinic Level of Care Assessment Details Patient Name: [name] Date of Service: 04/23/2020 9:00 AM Medical Record Number: [id number] Patient Account Number: [id number] Date of Birth/Sex: 1931/04/27 (86 y.o. F) Treating RN: Cornell Barman Primary Care Miraj Truss: Marisa Hua Other Clinician: Referring Gearl Baratta: Marisa Hua Treating Feliciano Wynter/Extender: Tito Dine in Treatment: 13 Clinic Level of Care Assessment Items TOOL 4 Quantity Score []  - Use when only an EandM is performed on FOLLOW-UP visit 0 ASSESSMENTS - Nursing Assessment / Reassessment X - Reassessment of Co-morbidities (includes updates in patient status) 1 10 X- 1 5 Reassessment of Adherence to Treatment Plan ASSESSMENTS - Wound and Skin Assessment / Reassessment []   - Simple Wound Assessment / Reassessment - one wound 0 X- 2 5 Complex Wound Assessment / Reassessment - multiple wounds []  - 0 Dermatologic / Skin Assessment (not related to wound area) ASSESSMENTS - Focused Assessment []  - Circumferential Edema Measurements - multi extremities 0 []  - 0 Nutritional Assessment / Counseling / Intervention []  - 0 Lower Extremity Assessment (monofilament, tuning fork, pulses) []  - 0 Peripheral Arterial Disease Assessment (using hand held doppler) ASSESSMENTS - Ostomy and/or Continence Assessment and Care []  - Incontinence Assessment and Management 0 []  - 0 Ostomy Care Assessment and Management (repouching, etc.) PROCESS - Coordination of Care X - Simple Patient / Family Education for ongoing care 1 15 []  - 0 Complex (extensive) Patient / Family Education for ongoing care X- 1 10 Staff obtains Consents, Records, Test Results / Process Orders []  - 0 Staff telephones HHA, Nursing Homes / Clarify orders / etc []  - 0 Routine Transfer to another Facility (non-emergent condition) []  - 0 Routine Hospital Admission (non-emergent condition) []  - 0 New Admissions / Biomedical engineer / Ordering NPWT, Apligraf, etc. []  - 0 Emergency Hospital Admission (emergent condition) X- 1 10 Simple Discharge Coordination []  - 0 Complex (extensive) Discharge Coordination PROCESS - Special Needs []  - Pediatric / Minor Patient Management 0 []  - 0 Isolation Patient Management []  - 0 Hearing / Language / Visual special needs []  - 0 Assessment of Community assistance (transportation, D/C planning, etc.) []  - 0 Additional assistance / Altered mentation []  - 0 Support Surface(s) Assessment (bed, cushion, seat, etc.) INTERVENTIONS - Wound Cleansing / Measurement [name], Danielle S. ([id number]) []  - 0 Simple Wound Cleansing - one wound X- 2 5 Complex Wound Cleansing - multiple wounds X- 1 5 Wound Imaging (photographs - any number of wounds) []  - 0 Wound Tracing  (instead of photographs) []  - 0 Simple Wound Measurement - one wound X- 2 5 Complex Wound Measurement -  multiple wounds INTERVENTIONS - Wound Dressings []  - Small Wound Dressing one or multiple wounds 0 []  - 0 Medium Wound Dressing one or multiple wounds X- 2 20 Large Wound Dressing one or multiple wounds []  - 0 Application of Medications - topical []  - 0 Application of Medications - injection INTERVENTIONS - Miscellaneous []  - External ear exam 0 []  - 0 Specimen Collection (cultures, biopsies, blood, body fluids, etc.) []  - 0 Specimen(s) / Culture(s) sent or taken to Lab for analysis []  - 0 Patient Transfer (multiple staff / Civil Service fast streamer / Similar devices) []  - 0 Simple Staple / Suture removal (25 or less) []  - 0 Complex Staple / Suture removal (26 or more) []  - 0 Hypo / Hyperglycemic Management (close monitor of Blood Glucose) []  - 0 Ankle / Brachial Index (ABI) - do not check if billed separately X- 1 5 Vital Signs Has the patient been seen at the hospital within the last three years: Yes Total Score: 130 Level Of Care: New/Established - Level 4 Electronic Signature(s) Signed: 04/23/2020 12:39:12 PM By: Gretta Cool, BSN, RN, CWS, Kim RN, BSN Entered By: Gretta Cool, BSN, RN, CWS, Kim on 04/23/2020 09:38:00 [name] ([id number]) -------------------------------------------------------------------------------- Encounter Discharge Information Details Patient Name: [name]. Date of Service: 04/23/2020 9:00 AM Medical Record Number: [id number] Patient Account Number: [id number] Date of Birth/Sex: [birth year] (86 y.o. F) Treating RN: Cornell Barman Primary Care Roshaunda Starkey: Marisa Hua Other Clinician: Referring Brandee Markin: Marisa Hua Treating Danyela Posas/Extender: Tito Dine in Treatment: 13 Encounter Discharge Information Items Discharge Condition: Stable Ambulatory Status: Wheelchair Discharge Destination: Home Transportation: Private Auto Accompanied  By: self Schedule Follow-up Appointment: Yes Clinical Summary of Care: Electronic Signature(s) Signed: 04/23/2020 12:39:12 PM By: Gretta Cool, BSN, RN, CWS, Kim RN, BSN Entered By: Gretta Cool, BSN, RN, CWS, Kim on 04/23/2020 09:39:33 [name] ([id number]) -------------------------------------------------------------------------------- Lower Extremity Assessment Details Patient Name: [name]. Date of Service: 04/23/2020 9:00 AM Medical Record Number: [id number] Patient Account Number: [id number] Date of Birth/Sex: [birth year] (86 y.o. F) Treating RN: Army Melia Primary Care Jonty Morrical: Marisa Hua Other Clinician: Referring Athziri Freundlich: Marisa Hua Treating Mancel Lardizabal/Extender: Ricard Dillon Weeks in Treatment: 13 Edema Assessment Assessed: [Left: No] [Right: No] Edema: [Left: No] [Right: No] Vascular Assessment Pulses: Dorsalis Pedis Palpable: [Left:Yes] [Right:Yes] Electronic Signature(s) Signed: 04/24/2020 10:39:21 AM By: Army Melia Entered By: Army Melia on 04/23/2020 09:26:48 [name], [name] ([id number]) -------------------------------------------------------------------------------- Multi Wound Chart Details Patient Name: [name]. Date of Service: 04/23/2020 9:00 AM Medical Record Number: [id number] Patient Account Number: [id number] Date of Birth/Sex: 11/18/1930 (86 y.o. F) Treating RN: Cornell Barman Primary Care Lorenzo Arscott: Marisa Hua Other Clinician: Referring Carmilla Granville: Marisa Hua Treating Devi Hopman/Extender: Tito Dine in Treatment: 13 Vital Signs Height(in): Pulse(bpm): 31 Weight(lbs): Blood Pressure(mmHg): 169/61 Body Mass Index(BMI): Temperature(F): 98.0 Respiratory Rate(breaths/min): 16 Photos: [N/A:N/A] Wound Location: Right Calcaneus Left Calcaneus N/A Wounding Event: Gradually Appeared Gradually Appeared N/A Primary Etiology: Pressure Ulcer Pressure Ulcer N/A Comorbid History: Cataracts, Hypertension, Peripheral  Cataracts, Hypertension, Peripheral N/A Venous Disease, End Stage Renal Venous Disease, End Stage Renal Disease, History of pressure Disease, History of pressure wounds, Neuropathy wounds, Neuropathy Date Acquired: 12/16/2017 12/16/2017 N/A Weeks of Treatment: 13 13 N/A Wound Status: Open Open N/A Pending Amputation on Yes Yes N/A Presentation: Measurements L x W x D (cm) 1x1x0.1 0.5x0.5x0.2 N/A Area (cm) : 0.785 0.196 N/A Volume (cm) : 0.079 0.039 N/A % Reduction in Area: -565.30% 97.00% N/A % Reduction in Volume: -558.30% 98.00% N/A Classification: Category/Stage II Category/Stage  III N/A Exudate Amount: None Present Medium N/A Exudate Type: N/A Serosanguineous N/A Exudate Color: N/A red, brown N/A Wound Margin: Flat and Intact Flat and Intact N/A Granulation Amount: None Present (0%) Medium (34-66%) N/A Granulation Quality: N/A Pink N/A Necrotic Amount: None Present (0%) Medium (34-66%) N/A Exposed Structures: Fascia: No Fat Layer (Subcutaneous Tissue) N/A Fat Layer (Subcutaneous Tissue) Exposed: Yes Exposed: No Fascia: No Tendon: No Tendon: No Muscle: No Muscle: No Joint: No Joint: No Bone: No Bone: No Limited to Skin Breakdown Epithelialization: Large (67-100%) None N/A Treatment Notes Wound #11 (Right Calcaneus) Notes silvercell, heel cup, kerlix. [name], [name] ([id number]) Wound #12 (Left Calcaneus) Notes silvercell, heel cup, kerlix. Electronic Signature(s) Signed: 04/23/2020 4:15:00 PM By: Linton Ham MD Entered By: Linton Ham on 04/23/2020 [place], Adrian ([id number]) -------------------------------------------------------------------------------- [place] Details Patient Name: [name] Date of Service: 04/23/2020 9:00 AM Medical Record Number: [id number] Patient Account Number: [id number] Date of Birth/Sex: [birth year] (86 y.o. F) Treating RN: Cornell Barman Primary Care Keandria Berrocal: Marisa Hua Other  Clinician: Referring Brieann Osinski: Marisa Hua Treating Jolinda Pinkstaff/Extender: Tito Dine in Treatment: 13 Active Inactive Orientation to the Wound Care Program Nursing Diagnoses: Knowledge deficit related to the wound healing center program Goals: Patient/caregiver will verbalize understanding of the [place] Program Date Initiated: 01/21/2020 Target Resolution Date: 02/22/2020 Goal Status: Active Interventions: Provide education on orientation to the wound center Notes: Pressure Nursing Diagnoses: Knowledge deficit related to causes and risk factors for pressure ulcer development Goals: Patient/caregiver will verbalize risk factors for pressure ulcer development Date Initiated: 01/21/2020 Target Resolution Date: 02/22/2020 Goal Status: Active Interventions: Assess potential for pressure ulcer upon admission and as needed Notes: Wound/Skin Impairment Nursing Diagnoses: Impaired tissue integrity Goals: Ulcer/skin breakdown will have a volume reduction of 30% by week 4 Date Initiated: 01/21/2020 Target Resolution Date: 02/22/2020 Goal Status: Active Interventions: Assess ulceration(s) every visit Notes: Electronic Signature(s) Signed: 04/23/2020 12:39:12 PM By: Gretta Cool, BSN, RN, CWS, Kim RN, BSN Entered By: Gretta Cool, BSN, RN, CWS, Kim on 04/23/2020 09:33:54 [name] ([id number]) -------------------------------------------------------------------------------- Pain Assessment Details Patient Name: [name]. Date of Service: 04/23/2020 9:00 AM Medical Record Number: [id number] Patient Account Number: [id number] Date of Birth/Sex: 1931-02-19 (86 y.o. F) Treating RN: Army Melia Primary Care Ronie Barnhart: Marisa Hua Other Clinician: Referring Danella Philson: Marisa Hua Treating Nuvia Hileman/Extender: Tito Dine in Treatment: 13 Active Problems Location of Pain Severity and Description of Pain Patient Has Paino No Site Locations Pain  Management and Medication Current Pain Management: Electronic Signature(s) Signed: 04/24/2020 10:39:21 AM By: Army Melia Entered By: Army Melia on 04/23/2020 09:17:58 [name] ([id number]) -------------------------------------------------------------------------------- Patient/Caregiver Education Details Patient Name: [name]. Date of Service: 04/23/2020 9:00 AM Medical Record Number: [id number] Patient Account Number: [id number] Date of Birth/Gender: Nov 12, 1930 (86 y.o. F) Treating RN: Cornell Barman Primary Care Physician: Marisa Hua Other Clinician: Referring Physician: Marisa Hua Treating Physician/Extender: Tito Dine in Treatment: 13 Education Assessment Education Provided To: Patient Education Topics Provided Wound/Skin Impairment: Handouts: Caring for Your Ulcer Methods: Demonstration, Explain/Verbal Responses: State content correctly Electronic Signature(s) Signed: 04/23/2020 12:39:12 PM By: Gretta Cool, BSN, RN, CWS, Kim RN, BSN Entered By: Gretta Cool, BSN, RN, CWS, Kim on 04/23/2020 09:38:31 [name] ([id number]) -------------------------------------------------------------------------------- Wound Assessment Details Patient Name: [name]. Date of Service: 04/23/2020 9:00 AM Medical Record Number: [id number] Patient Account Number: [id number] Date of Birth/Sex: 08/14/1931 (86 y.o. F) Treating RN: Army Melia Primary Care Thelia Tanksley: Marisa Hua Other Clinician: Referring  Eduardo Wurth: Marisa Hua Treating Bevelyn Arriola/Extender: Tito Dine in Treatment: 13 Wound Status Wound Number: 11 Primary Pressure Ulcer Etiology: Wound Location: Right Calcaneus Wound Open Wounding Event: Gradually Appeared Status: Date Acquired: 12/16/2017 Comorbid Cataracts, Hypertension, Peripheral Venous Disease, End Weeks Of Treatment: 13 History: Stage Renal Disease, History of pressure wounds, Clustered Wound: No Neuropathy Pending  Amputation On Presentation Photos Wound Measurements Length: (cm) 1 Width: (cm) 1 Depth: (cm) 0.1 Area: (cm) 0.785 Volume: (cm) 0.079 % Reduction in Area: -565.3% % Reduction in Volume: -558.3% Epithelialization: Large (67-100%) Wound Description Classification: Category/Stage II Fo Wound Margin: Flat and Intact Sl Exudate Amount: None Present ul Odor After Cleansing: No ough/Fibrino No Wound Bed Granulation Amount: None Present (0%) Exposed Structure Necrotic Amount: None Present (0%) Fascia Exposed: No Fat Layer (Subcutaneous Tissue) Exposed: No Tendon Exposed: No Muscle Exposed: No Joint Exposed: No Bone Exposed: No Limited to Skin Breakdown Treatment Notes Wound #11 (Right Calcaneus) Notes silvercell, heel cup, kerlix. Electronic Signature(s) Signed: 04/24/2020 10:39:21 AM By: [name] ([id number]) Entered By: Army Melia on 04/23/2020 09:25:35 [name] ([id number]) -------------------------------------------------------------------------------- Wound Assessment Details Patient Name: [name]. Date of Service: 04/23/2020 9:00 AM Medical Record Number: [id number] Patient Account Number: [id number] Date of Birth/Sex: 03-Jun-1931 (86 y.o. F) Treating RN: Army Melia Primary Care Gardiner Espana: Marisa Hua Other Clinician: Referring Emersen Carroll: Marisa Hua Treating Bela Nyborg/Extender: Tito Dine in Treatment: 13 Wound Status Wound Number: 12 Primary Pressure Ulcer Etiology: Wound Location: Left Calcaneus Wound Open Wounding Event: Gradually Appeared Status: Date Acquired: 12/16/2017 Comorbid Cataracts, Hypertension, Peripheral Venous Disease, End Weeks Of Treatment: 13 History: Stage Renal Disease, History of pressure wounds, Clustered Wound: No Neuropathy Pending Amputation On Presentation Photos Wound Measurements Length: (cm) 0.5 % Width: (cm) 0.5 % Depth: (cm) 0.2 Ep Area: (cm) 0.196 Volume: (cm)  0.039 Reduction in Area: 97% Reduction in Volume: 98% ithelialization: None Wound Description Classification: Category/Stage III Fo Wound Margin: Flat and Intact Sl Exudate Amount: Medium Exudate Type: Serosanguineous Exudate Color: red, brown ul Odor After Cleansing: No ough/Fibrino Yes Wound Bed Granulation Amount: Medium (34-66%) Exposed Structure Granulation Quality: Pink Fascia Exposed: No Necrotic Amount: Medium (34-66%) Fat Layer (Subcutaneous Tissue) Exposed: Yes Necrotic Quality: Adherent Slough Tendon Exposed: No Muscle Exposed: No Joint Exposed: No Bone Exposed: No Treatment Notes Wound #12 (Left Calcaneus) Notes silvercell, heel cup, kerlix. [name], [name] ([id number]) Electronic Signature(s) Signed: 04/24/2020 10:39:21 AM By: Army Melia Entered By: Army Melia on 04/23/2020 44:01:02 [name] ([id number]) -------------------------------------------------------------------------------- [place] Details Patient Name: [name]. Date of Service: 04/23/2020 9:00 AM Medical Record Number: [id number] Patient Account Number: [id number] Date of Birth/Sex: [birth year] (86 y.o. F) Treating RN: Army Melia Primary Care Gian Ybarra: Marisa Hua Other Clinician: Referring Brionna Romanek: Marisa Hua Treating Maximilien Hayashi/Extender: Tito Dine in Treatment: 13 Vital Signs Time Taken: 09:17 Temperature (F): 98.0 Pulse (bpm): 66 Respiratory Rate (breaths/min): 16 Blood Pressure (mmHg): 169/61 Reference Range: 80 - 120 mg / dl Electronic Signature(s) Signed: 04/24/2020 10:39:21 AM By: Army Melia Entered By: Army Melia on 04/23/2020 09:17:47

## 2020-05-06 ENCOUNTER — Encounter: Payer: Medicare Other | Admitting: Physician Assistant

## 2020-05-06 ENCOUNTER — Other Ambulatory Visit: Payer: Self-pay

## 2020-05-06 DIAGNOSIS — L89613 Pressure ulcer of right heel, stage 3: Secondary | ICD-10-CM | POA: Diagnosis not present

## 2020-05-06 NOTE — Progress Notes (Addendum)
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 chills, nausea, or vomiting noted at this time. [name], [name] ([id number]) 06/02/17 on evaluation today patient's wounds appeared to be doing about the same. She does not have any significant overall improvement of her wounds at this point. They also do not appear to be significantly worse which is good news. She is having some discomfort in regard to the right lower extremity but this is minimal and only with cleansing of the wound. The left is nontender. No fevers, chills, nausea, or vomiting noted at this time. 10/27/17 on evaluation today patient appears to be doing okay in regard to her bilateral lower extremity ulcers. She has been tolerating the dressing changes fairly well. Unfortunately overall even though she is tolerating this her wound has been very slow to heal. We have previously treated her with Affinity grafts and she did have a lot of good improvement prior to having to be admitted to the hospital. Subsequently since that point she has been maintaining but there has not been a dramatic improvement in the overall wound size. She does have discomfort although this does not seem to be terribly uncomfortable for her at this time. Patient does have definite pressure that is getting to the wound sites or least has in the past although now with her offloading boots that is no longer the case. She does have evidence of some venous stasis as well which may be complicating the picture and preventing  improvement overall. There is no new injury to indicate additional pressure to the site. 11/10/17 she presents today in follow-up evaluation of bilateral heel ulcers. There is improvement noted and these are close to being completely epithelialized. We will continue with current treatment plan with expectation of follow-up next  week. 12/01/17-she presents today in follow-up evaluation for bilateral heel ulcers. The left heel appears to have newly formed epithelial tissue with no observable moisture or drainage, the right heel has a miniscule amount of partial-thickness opening. There is no signs of infection. There is concern for pressure to the left lateral foot and left lateral malleolus. The left lateral foot has blanchable erythema and the left lateral malleolus appears to have resolving deep tissue injury. She states she is in the offloading boots 24/7. She is unable to follow-up next week secondary to transportation and will follow up in 2 weeks at which time I anticipate a discharge. 12/22/17-she is here in follow-up evaluation for bilateral heel ulcers. She is currently at [facility] for rehabilitation but will be discharged home tomorrow. She is expressing concerns regarding safety and has been strongly encouraged to speak with the discharge planner about transition to long-term care since her rehabilitation coverage has been met. The right heel has evidence of new/unrelieved pressure; she states that the offloading boots have been on 24/7 at the facility. The left heel is healed. She will follow up next week 01/10/18 on evaluation today patient appears to be doing fairly well in regard to her heels. She has been tolerating the dressing changes without complication. With that being said patient does have ulcerations noted of the bilateral heels at this point. The right does seem to be worse than left though both wounds required sharp debridement today. She is having no significant discomfort  which is great news. Readmission: 01/21/2020 patient presents today for follow-up concerning her bilateral heel ulcers. I previously have seen her back in 2019 both in clinic as well as in the skilled nursing facility when I was still doing that. Subsequently she unfortunately still has the wounds on the bilateral heels that I saw her for back at that point. She still is at [facility] all fields although apparently the name has changed I am not sure what the new name of the facility is. Nonetheless she does still have the Prevalon offloading boots that she is previously utilized and again that does seem to have been beneficial for her. Said she still has obvious signs of pressure injury to the left heel at this time unfortunately. That does have me somewhat concerned to be perfectly honest. Especially in light of the fact that she tells me she wears the boots at all times I am not sure exactly where the pressure injury is coming from. 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 01/28/2020 upon evaluation today patient appears to be doing well in general with regard to her heel ulcers. There is much less necrotic tissue as compared to the last evaluation last week. With that being said there does not appear to be any signs of active infection at this time. No fevers, chills, nausea, vomiting, or diarrhea. With that being said there was somewhat of an odor to the left heel it also appears potentially there could be some infection here I am going to see about looking into a culture for her today. 02/11/2020 upon evaluation today patient appears to be doing better in general with regard to her wounds. Fortunately there is no signs of active infection at this time. Unfortunately she does have some blue-green drainage from the right heel this  has me concerned about  the possibility of Pseudomonas to be honest. The clindamycin that she is currently on would not cover for this. It would need to be something such as Cipro or Levaquin. Nonetheless I think that something we may need to go ahead and start her today apparently she finishes the clindamycin which for some reason after 2 days of the Augmentin she was switched to the patient really does not know why but nonetheless the clindamycin should have been appropriate based on the culture results as well. 02/25/2020 upon evaluation today patient's heels actually appear to be doing well at this point. There is no sign of active infection and overall she seems to be doing quite well which is great news. 03/10/2020 upon evaluation today patient's heels actually do appear to be doing better at this point which is great news. Fortunately there is no evidence of active infection which is also great news. I do not see any signs of worsening and in fact I think that she seems to be doing much better in general in my opinion. 03/24/2020 upon evaluation today patient actually appears to be doing well in regard to her bilateral heels. 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 Even having seen her some in the skilled nursing facility when I was traveling to [place] as well. Nonetheless she fortunately does not seem to have pain at rest though she did have some pain over the open areas of her heels also appears to be some drainage on the lateral aspect of the right foot right around the heel that I did culture today to see if this is any evidence of cellulitis or if it is just lymphedema/edema buildup. 04/07/2020 upon evaluation today patient appears to be doing well with regard to her wounds. She still has wounds open on both heels but this is minimal at this  point. She also is on the correct antibiotics now for her MRSA infection that she had of the right heel. I did send that in as an order to her facility and they argue to be starting her on that. That is can be doxycycline that I placed her on this was sensitive to that. Fortunately that she do excellent for her as far as try to get this infection under much better control. 7/7; patient I am not really that familiar with. She is wheelchair dependent and per description he has had chronic pressure ulcers on her bilateral heels for the past 2 to 3 years. She has been using silver alginate to the wound. She finished antibiotics that were prescribed for her last month. Today I cannot identify an open area on the right heel. 2 small areas on the Achilles area of the left heel are still open but almost closed. She is wearing big offloading soft boots 05/06/2020 upon evaluation today patient appears to be doing well with regard to her heel ulcers. Overall very pleased and grateful that she appears to be completely healed this has been a long time coming and honestly I think they have done a great job at the facility taking care of her wounds. She appears to have no signs of infection. [name], [name] ([id number]) Objective Constitutional Well-nourished and well-hydrated in no acute distress. Vitals Time Taken: 10:35 AM, Temperature: 98.0 F, Pulse: 67 bpm, Respiratory Rate: 16 breaths/min,  Blood Pressure: 159/63 mmHg. Respiratory normal breathing without difficulty. Psychiatric this patient is able to make decisions and demonstrates good insight into disease process. Alert and Oriented x 3. pleasant and cooperative. General Notes: Upon inspection patient's wounds currently showed signs of complete epithelization there is no open wound I did evaluate this thoroughly of both heel locations and could not find anything that was open or draining at this point which is great news. Integumentary (Hair,  Skin) Wound #11 status is Open. Original cause of wound was Gradually Appeared. The wound is located on the Right Calcaneus. The wound measures 0cm length x 0cm width x 0cm depth; 0cm^2 area and 0cm^3 volume. The wound is limited to skin breakdown. There is a none present amount of drainage noted. The wound margin is flat and intact. There is no granulation within the wound bed. There is no necrotic tissue within the wound bed. Wound #12 status is Open. Original cause of wound was Gradually Appeared. The wound is located on the Left Calcaneus. The wound measures 0cm length x 0cm width x 0cm depth; 0cm^2 area and 0cm^3 volume. There is Fat Layer (Subcutaneous Tissue) Exposed exposed. There is a medium amount of serosanguineous drainage noted. The wound margin is flat and intact. There is medium (34-66%) pink granulation within the wound bed. There is a medium (34-66%) amount of necrotic tissue within the wound bed including Adherent Slough. Assessment Active Problems ICD-10 Pressure ulcer of left heel, stage 3 Pressure ulcer of right heel, stage 3 Essential (primary) hypertension Chronic kidney disease, stage 3 unspecified Varicose veins of left lower extremity with ulcer of heel and midfoot Dependence on wheelchair Plan Off-Loading: Other: - Wear offloading boots when sitting and in bed. Discharge From [facility] Services: Discharge from Wound Care Center - treatment complete 1. I am going to suggest currently that we go ahead and initiate a continuation of protection for the patient with regard to her Prevalon offloading boots that she has been wearing I think that is good for her to continue at this point for protecting the heels. This should be when sitting and in bed. 2. We will otherwise discontinue wound care services as the patient is completely healed. We will see the patient back for follow-up visit as needed. Electronic Signature(s) [name], [name] ([id number]) Signed: 05/06/2020  11:12:50 AM By: Lenda Kelp PA-C Entered By: Lenda Kelp on 05/06/2020 11:12:49 [name] ([id number]) -------------------------------------------------------------------------------- SuperBill Details Patient Name: [name] Date of Service: 05/06/2020 Medical Record Number: [id number] Patient Account Number: [id number] Date of Birth/Sex: 1931/03/29 (86 y.o. 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 Z99.3 Dependence on wheelchair Facility Procedures CPT4 Code: [id number] Description: 48259 - WOUND CARE VISIT-LEV 2 EST PT Modifier: Quantity: 1 Physician Procedures CPT4 Code: [id number] Description: 99213 - WC PHYS LEVEL 3 - EST PT Modifier: Quantity: 1 CPT4 Code: Description: ICD-10 Diagnosis Description L89.623 Pressure ulcer of left heel, stage 3 L89.613 Pressure ulcer of right heel, stage 3 I10 Essential (primary) hypertension N18.30 Chronic kidney disease, stage 3 unspecified Modifier: Quantity: Electronic Signature(s) Signed: 05/06/2020 11:13:05 AM By: Lenda Kelp PA-C Entered By:  Worthy Keeler on 05/06/2020 11:13:05

## 2020-05-09 NOTE — Progress Notes (Signed)
YARIELIZ, WASSER ([id number]) Visit Report for 05/06/2020 Arrival Information Details Patient Name: KASHARA, BLOCHER. Date of Service: 05/06/2020 10:30 AM Medical Record Number: [id number] Patient Account Number: [id number] Date of Birth/Sex: 01-Mar-1931 (86 y.o. F) Treating RN: Cornell Barman Primary Care Ritvik Mczeal: Marisa Hua Other Clinician: Referring Azyriah Nevins: Marisa Hua Treating Citlally Captain/Extender: Melburn Hake, HOYT Weeks in Treatment: 15 Visit Information History Since Last Visit Added or deleted any medications: No Patient Arrived: Wheel Chair Any new allergies or adverse reactions: No Arrival Time: 10:32 Had a fall or experienced change in No Accompanied By: self activities of daily living that may affect Transfer Assistance: None risk of falls: Patient Identification Verified: Yes Signs or symptoms of abuse/neglect since last visito No Secondary Verification Process Completed: Yes Hospitalized since last visit: No Implantable device outside of the clinic excluding No cellular tissue based products placed in the center since last visit: Has Dressing in Place as Prescribed: Yes Pain Present Now: No Electronic Signature(s) Signed: 05/06/2020 11:27:33 AM By: Sandre Kitty Entered By: Sandre Kitty on 05/06/2020 10:32:37 [name] ([id number]) -------------------------------------------------------------------------------- Clinic Level of Care Assessment Details Patient Name: [name]. Date of Service: 05/06/2020 10:30 AM Medical Record Number: [id number] Patient Account Number: [id number] Date of Birth/Sex: Apr 28, 1931 (86 y.o. F) Treating RN: Cornell Barman Primary Care Conya Ellinwood: Marisa Hua Other Clinician: Referring Wah Sabic: Marisa Hua Treating Penina Reisner/Extender: Melburn Hake, HOYT Weeks in Treatment: 15 Clinic Level of Care Assessment Items TOOL 4 Quantity Score []  - Use when only an EandM is performed on FOLLOW-UP visit 0 ASSESSMENTS -  Nursing Assessment / Reassessment X - Reassessment of Co-morbidities (includes updates in patient status) 1 10 []  - 0 Reassessment of Adherence to Treatment Plan ASSESSMENTS - Wound and Skin Assessment / Reassessment X - Simple Wound Assessment / Reassessment - one wound 1 5 []  - 0 Complex Wound Assessment / Reassessment - multiple wounds []  - 0 Dermatologic / Skin Assessment (not related to wound area) ASSESSMENTS - Focused Assessment []  - Circumferential Edema Measurements - multi extremities 0 []  - 0 Nutritional Assessment / Counseling / Intervention []  - 0 Lower Extremity Assessment (monofilament, tuning fork, pulses) []  - 0 Peripheral Arterial Disease Assessment (using hand held doppler) ASSESSMENTS - Ostomy and/or Continence Assessment and Care []  - Incontinence Assessment and Management 0 []  - 0 Ostomy Care Assessment and Management (repouching, etc.) PROCESS - Coordination of Care X - Simple Patient / Family Education for ongoing care 1 15 []  - 0 Complex (extensive) Patient / Family Education for ongoing care []  - 0 Staff obtains Programmer, systems, Records, Test Results / Process Orders []  - 0 Staff telephones HHA, Nursing Homes / Clarify orders / etc []  - 0 Routine Transfer to another Facility (non-emergent condition) []  - 0 Routine Hospital Admission (non-emergent condition) []  - 0 New Admissions / Biomedical engineer / Ordering NPWT, Apligraf, etc. []  - 0 Emergency Hospital Admission (emergent condition) X- 1 10 Simple Discharge Coordination []  - 0 Complex (extensive) Discharge Coordination PROCESS - Special Needs []  - Pediatric / Minor Patient Management 0 []  - 0 Isolation Patient Management []  - 0 Hearing / Language / Visual special needs []  - 0 Assessment of Community assistance (transportation, D/C planning, etc.) []  - 0 Additional assistance / Altered mentation []  - 0 Support Surface(s) Assessment (bed, cushion, seat, etc.) INTERVENTIONS - Wound  Cleansing / Measurement Kamel, Maryanne S. ([id number]) []  - 0 Simple Wound Cleansing - one wound []  - 0 Complex Wound Cleansing - multiple wounds X- 1 5 Wound Imaging (  photographs - any number of wounds) []  - 0 Wound Tracing (instead of photographs) []  - 0 Simple Wound Measurement - one wound []  - 0 Complex Wound Measurement - multiple wounds INTERVENTIONS - Wound Dressings []  - Small Wound Dressing one or multiple wounds 0 []  - 0 Medium Wound Dressing one or multiple wounds []  - 0 Large Wound Dressing one or multiple wounds []  - 0 Application of Medications - topical []  - 0 Application of Medications - injection INTERVENTIONS - Miscellaneous []  - External ear exam 0 []  - 0 Specimen Collection (cultures, biopsies, blood, body fluids, etc.) []  - 0 Specimen(s) / Culture(s) sent or taken to Lab for analysis []  - 0 Patient Transfer (multiple staff / Civil Service fast streamer / Similar devices) []  - 0 Simple Staple / Suture removal (25 or less) []  - 0 Complex Staple / Suture removal (26 or more) []  - 0 Hypo / Hyperglycemic Management (close monitor of Blood Glucose) []  - 0 Ankle / Brachial Index (ABI) - do not check if billed separately X- 1 5 Vital Signs Has the patient been seen at the hospital within the last three years: Yes Total Score: 50 Level Of Care: New/Established - Level 2 Electronic Signature(s) Signed: 05/08/2020 6:25:29 PM By: Gretta Cool, BSN, RN, CWS, Kim RN, BSN Entered By: Gretta Cool, BSN, RN, CWS, Kim on 05/06/2020 10:54:18 [name] ([id number]) -------------------------------------------------------------------------------- Encounter Discharge Information Details Patient Name: [name]. Date of Service: 05/06/2020 10:30 AM Medical Record Number: [id number] Patient Account Number: [id number] Date of Birth/Sex: 1931-08-13 (86 y.o. F) Treating RN: Cornell Barman Primary Care Timberlee Roblero: Marisa Hua Other Clinician: Referring Antonie Borjon: Marisa Hua Treating  Offie Pickron/Extender: Melburn Hake, HOYT Weeks in Treatment: 15 Encounter Discharge Information Items Discharge Condition: Stable Ambulatory Status: Ambulatory Discharge Destination: Home Transportation: Private Auto Accompanied By: self Schedule Follow-up Appointment: Yes Clinical Summary of Care: Electronic Signature(s) Signed: 05/08/2020 6:25:29 PM By: Gretta Cool, BSN, RN, CWS, Kim RN, BSN Entered By: Gretta Cool, BSN, RN, CWS, Kim on 05/06/2020 10:55:29 [name] ([id number]) -------------------------------------------------------------------------------- Lower Extremity Assessment Details Patient Name: [name]. Date of Service: 05/06/2020 10:30 AM Medical Record Number: [id number] Patient Account Number: [id number] Date of Birth/Sex: [birth year] (86 y.o. F) Treating RN: Cornell Barman Primary Care Rasheida Broden: Marisa Hua Other Clinician: Referring Zyion Leidner: Marisa Hua Treating Davaun Quintela/Extender: Melburn Hake, HOYT Weeks in Treatment: 15 Vascular Assessment Pulses: Dorsalis Pedis Palpable: [Left:Yes] [Right:Yes] Electronic Signature(s) Signed: 05/08/2020 6:25:29 PM By: Gretta Cool, BSN, RN, CWS, Kim RN, BSN Entered By: Gretta Cool, BSN, RN, CWS, Kim on 05/06/2020 10:51:51 [name] ([id number]) -------------------------------------------------------------------------------- Multi Wound Chart Details Patient Name: [name] Date of Service: 05/06/2020 10:30 AM Medical Record Number: [id number] Patient Account Number: [id number] Date of Birth/Sex: 04-24-1931 (86 y.o. F) Treating RN: Cornell Barman Primary Care Bana Borgmeyer: Marisa Hua Other Clinician: Referring Nelsy Madonna: Marisa Hua Treating Nyala Kirchner/Extender: Melburn Hake, HOYT Weeks in Treatment: 15 Vital Signs Height(in): Pulse(bpm): 90 Weight(lbs): Blood Pressure(mmHg): 159/63 Body Mass Index(BMI): Temperature(F): 98.0 Respiratory Rate(breaths/min): 16 Photos: [N/A:N/A] Wound Location: Right Calcaneus Left  Calcaneus N/A Wounding Event: Gradually Appeared Gradually Appeared N/A Primary Etiology: Pressure Ulcer Pressure Ulcer N/A Comorbid History: Cataracts, Hypertension, Peripheral Cataracts, Hypertension, Peripheral N/A Venous Disease, End Stage Renal Venous Disease, End Stage Renal Disease, History of pressure Disease, History of pressure wounds, Neuropathy wounds, Neuropathy Date Acquired: 12/16/2017 12/16/2017 N/A Weeks of Treatment: 15 15 N/A Wound Status: Open Open N/A Pending Amputation on Yes Yes N/A Presentation: Measurements L x W x D (cm) 0x0x0 0x0x0 N/A Area (cm) : 0  0 N/A Volume (cm) : 0 0 N/A % Reduction in Area: 100.00% 100.00% N/A % Reduction in Volume: 100.00% 100.00% N/A Classification: Category/Stage II Category/Stage III N/A Exudate Amount: None Present Medium N/A Exudate Type: N/A Serosanguineous N/A Exudate Color: N/A red, brown N/A Wound Margin: Flat and Intact Flat and Intact N/A Granulation Amount: None Present (0%) Medium (34-66%) N/A Granulation Quality: N/A Pink N/A Necrotic Amount: None Present (0%) Medium (34-66%) N/A Exposed Structures: Fascia: No Fat Layer (Subcutaneous Tissue) N/A Fat Layer (Subcutaneous Tissue) Exposed: Yes Exposed: No Fascia: No Tendon: No Tendon: No Muscle: No Muscle: No Joint: No Joint: No Bone: No Bone: No Limited to Skin Breakdown Epithelialization: Large (67-100%) None N/A Treatment Notes Electronic Signature(s) Signed: 05/08/2020 6:25:29 PM By: Gretta Cool, BSN, RN, CWS, Kim RN, BSN Entered By: Gretta Cool, BSN, RN, CWS, Kim on 05/06/2020 10:52:21 [name] ([name] ([id number]) -------------------------------------------------------------------------------- [place] Details Patient Name: [name]. Date of Service: 05/06/2020 10:30 AM Medical Record Number: [id number] Patient Account Number: [id number] Date of Birth/Sex: 1930/12/09 (86 y.o. F) Treating RN: Cornell Barman Primary Care  Amey Hossain: Marisa Hua Other Clinician: Referring Davey Bergsma: Marisa Hua Treating Shadavia Dampier/Extender: Worthy Keeler Weeks in Treatment: 15 Active Inactive Electronic Signature(s) Signed: 05/08/2020 6:25:29 PM By: Gretta Cool, BSN, RN, CWS, Kim RN, BSN Entered By: Gretta Cool, BSN, RN, CWS, Kim on 05/06/2020 10:52:08 [name] ([id number]) -------------------------------------------------------------------------------- Pain Assessment Details Patient Name: [name] Date of Service: 05/06/2020 10:30 AM Medical Record Number: [id number] Patient Account Number: [id number] Date of Birth/Sex: 01/20/1931 (86 y.o. F) Treating RN: Cornell Barman Primary Care Jenella Craigie: Marisa Hua Other Clinician: Referring Bruce Churilla: Marisa Hua Treating Niccolas Loeper/Extender: Melburn Hake, HOYT Weeks in Treatment: 15 Active Problems Location of Pain Severity and Description of Pain Patient Has Paino No Site Locations Pain Management and Medication Current Pain Management: Electronic Signature(s) Signed: 05/06/2020 11:27:33 AM By: Sandre Kitty Signed: 05/08/2020 6:25:29 PM By: Gretta Cool, BSN, RN, CWS, Kim RN, BSN Entered By: Sandre Kitty on 05/06/2020 10:35:31 [name] ([id number]) -------------------------------------------------------------------------------- Patient/Caregiver Education Details Patient Name: [name] Date of Service: 05/06/2020 10:30 AM Medical Record Number: [id number] Patient Account Number: [id number] Date of Birth/Gender: [birth year] (86 y.o. F) Treating RN: Cornell Barman Primary Care Physician: Marisa Hua Other Clinician: Referring Physician: Marisa Hua Treating Physician/Extender: Sharalyn Ink in Treatment: 15 Education Assessment Education Provided To: Patient Education Topics Provided Wound/Skin Impairment: Handouts: Other: wear prevalon boots when sitting and in bed Methods: Demonstration, Explain/Verbal Responses: State  content correctly Electronic Signature(s) Signed: 05/08/2020 6:25:29 PM By: Gretta Cool, BSN, RN, CWS, Kim RN, BSN Entered By: Gretta Cool, BSN, RN, CWS, Kim on 05/06/2020 10:55:04 [name] ([id number]) -------------------------------------------------------------------------------- Wound Assessment Details Patient Name: [name]. Date of Service: 05/06/2020 10:30 AM Medical Record Number: [id number] Patient Account Number: [id number] Date of Birth/Sex: 1931-04-05 (86 y.o. F) Treating RN: Cornell Barman Primary Care Zyier Dykema: Marisa Hua Other Clinician: Referring Dace Denn: Marisa Hua Treating Aarik Blank/Extender: STONE III, HOYT Weeks in Treatment: 15 Wound Status Wound Number: 11 Primary Pressure Ulcer Etiology: Wound Location: Right Calcaneus Wound Open Wounding Event: Gradually Appeared Status: Date Acquired: 12/16/2017 Comorbid Cataracts, Hypertension, Peripheral Venous Disease, End Weeks Of Treatment: 15 History: Stage Renal Disease, History of pressure wounds, Clustered Wound: No Neuropathy Pending Amputation On Presentation Photos Wound Measurements Length: (cm) 0 Width: (cm) 0 Depth: (cm) 0 Area: (cm) Volume: (cm) % Reduction in Area: 100% % Reduction in Volume: 100% Epithelialization: Large (67-100%) 0 0 Wound Description Classification: Category/Stage II Wound  Margin: Flat and Intact Exudate Amount: None Present Foul Odor After Cleansing: No Slough/Fibrino No Wound Bed Granulation Amount: None Present (0%) Exposed Structure Necrotic Amount: None Present (0%) Fascia Exposed: No Fat Layer (Subcutaneous Tissue) Exposed: No Tendon Exposed: No Muscle Exposed: No Joint Exposed: No Bone Exposed: No Limited to Skin Breakdown Electronic Signature(s) Signed: 05/06/2020 11:27:33 AM By: Sandre Kitty Signed: 05/08/2020 6:25:29 PM By: Gretta Cool, BSN, RN, CWS, Kim RN, BSN Entered By: Sandre Kitty on 05/06/2020 10:48:02 [name]  ([id number]) -------------------------------------------------------------------------------- Wound Assessment Details Patient Name: [name]. Date of Service: 05/06/2020 10:30 AM Medical Record Number: [id number] Patient Account Number: [id number] Date of Birth/Sex: 07-11-1931 (86 y.o. F) Treating RN: Cornell Barman Primary Care Sani Loiseau: Marisa Hua Other Clinician: Referring Brown Dunlap: Marisa Hua Treating Aleyza Salmi/Extender: STONE III, HOYT Weeks in Treatment: 15 Wound Status Wound Number: 12 Primary Pressure Ulcer Etiology: Wound Location: Left Calcaneus Wound Open Wounding Event: Gradually Appeared Status: Date Acquired: 12/16/2017 Comorbid Cataracts, Hypertension, Peripheral Venous Disease, End Weeks Of Treatment: 15 History: Stage Renal Disease, History of pressure wounds, Clustered Wound: No Neuropathy Pending Amputation On Presentation Photos Wound Measurements Length: (cm) 0 Width: (cm) 0 Depth: (cm) 0 Area: (cm) Volume: (cm) % Reduction in Area: 100% % Reduction in Volume: 100% Epithelialization: None 0 0 Wound Description Classification: Category/Stage III Wound Margin: Flat and Intact Exudate Amount: Medium Exudate Type: Serosanguineous Exudate Color: red, brown Foul Odor After Cleansing: No Slough/Fibrino Yes Wound Bed Granulation Amount: Medium (34-66%) Exposed Structure Granulation Quality: Pink Fascia Exposed: No Necrotic Amount: Medium (34-66%) Fat Layer (Subcutaneous Tissue) Exposed: Yes Necrotic Quality: Adherent Slough Tendon Exposed: No Muscle Exposed: No Joint Exposed: No Bone Exposed: No Electronic Signature(s) Signed: 05/06/2020 11:27:33 AM By: Sandre Kitty Signed: 05/08/2020 6:25:29 PM By: Gretta Cool, BSN, RN, CWS, Kim RN, BSN Entered By: Sandre Kitty on 05/06/2020 10:48:26 [name] ([id number]) -------------------------------------------------------------------------------- [place] Details Patient Name: [name]. Date of Service: 05/06/2020 10:30 AM Medical Record Number: [id number] Patient Account Number: [id number] Date of Birth/Sex: 1931-02-24 (86 y.o. F) Treating RN: Cornell Barman Primary Care Dimitriy Carreras: Marisa Hua Other Clinician: Referring Kaysha Parsell: Marisa Hua Treating Gaelle Adriance/Extender: Melburn Hake, HOYT Weeks in Treatment: 15 Vital Signs Time Taken: 10:35 Temperature (F): 98.0 Pulse (bpm): 67 Respiratory Rate (breaths/min): 16 Blood Pressure (mmHg): 159/63 Reference Range: 80 - 120 mg / dl Electronic Signature(s) Signed: 05/06/2020 11:27:33 AM By: Sandre Kitty Entered By: Sandre Kitty on 05/06/2020 10:35:26

## 2020-08-12 ENCOUNTER — Other Ambulatory Visit: Payer: Self-pay

## 2020-08-14 NOTE — Progress Notes (Signed)
   08/15/2020  CC:  Chief Complaint  Patient presents with  . Cysto    [id number] [name] is a 86 y.o. female who returns for an annual cystoscopy.   The patient has a history of chronic retention and indwelling suprapubic tube. Her suprapubic tube was originally placed on 07/2016.   Cystoscopy on 08/06/2019 noted normal urothelium with mild cystitis, presumably catheter related on posterior bladder wall. Mild trabeculation and bladder neck in open confirmation with small amount of whitish plaque-like material consistent with squamous metaplasia.   Creatinine is 1.1 as of 10/30/2019.  The patient denies any infections this past year.   Blood pressure (!) 165/79, pulse 96. NED. A&Ox3.   No respiratory distress   Abd soft, NT, ND Normal external genitalia with patent urethral meatus  Cystoscopy Procedure Note  Patient identification was confirmed, informed consent was obtained, and patient was prepped using Betadine solution.  Lidocaine jelly was administered per urethral meatus.    Procedure: - Flexible cystoscope introduced via SPT tract without any difficulty.   - Thorough search of the bladder revealed:    normal urothelium with mild cystitis, presumably catheter related on posterior bladder wall    Open bladder neck    no stones    no ulcers     no tumors    Mild trabeculation  - Ureteral orifices were normal in position and appearance.  Post-Procedure: - Patient tolerated the procedure well  Following the procedure, her SPT site was reprepped. A 16 FR foley catheter was then inserted without difficulty with return of clear irrigant fluid. The balloon was filled with 10 cc of sterile water.    Assessment/ Plan:  1. Urinary retention  Managed by chronic indwelling SPT Per new guideline suggestions the patient no longer requires annual cystoscopy unless symptomatic.  Will continue to monitor upper tracts  Follow up with Zara Council, PA-C in 1 year for symptoms  recheck.    Fransico Him, am acting as a scribe for Dr. Hollice Espy.  I have seen and examined the patient, labs/ imaging reviewed and discussed  management with Debroah Loop. I reviewed the PA'Danielle note and agree with the documented findings and plan of care.

## 2020-08-15 ENCOUNTER — Ambulatory Visit (INDEPENDENT_AMBULATORY_CARE_PROVIDER_SITE_OTHER): Payer: Medicare Other | Admitting: Urology

## 2020-08-15 ENCOUNTER — Encounter: Payer: Self-pay | Admitting: Urology

## 2020-08-15 ENCOUNTER — Other Ambulatory Visit: Payer: Self-pay

## 2020-08-15 VITALS — BP 165/79 | HR 96

## 2020-08-15 DIAGNOSIS — N390 Urinary tract infection, site not specified: Secondary | ICD-10-CM

## 2020-08-15 DIAGNOSIS — Z9359 Other cystostomy status: Secondary | ICD-10-CM | POA: Diagnosis not present

## 2023-03-18 ENCOUNTER — Ambulatory Visit: Payer: Medicare Other | Admitting: Urology

## 2023-04-01 ENCOUNTER — Ambulatory Visit: Payer: Medicare Other | Admitting: Urology

## 2023-04-03 NOTE — Progress Notes (Deleted)
04/04/2023 8:56 PM   [name] 1931-07-04 [id number]  Referring provider: Drue Flirt, MD [address] [place],  [place] [address]  Urological history: 1. Urinary retention -SPT placed (2017) -cysto (2021) NED  No chief complaint on file.   HPI: [name] is a 89 y.o. female who presents today for follow up.    Previous records reviewed.   She was last seen in our office in 2021.    PMH: Past Medical History:  Diagnosis Date   Anemia    Chronic kidney disease, stage 3 (HCC)    Degenerative joint disease (DJD) of lumbar spine    Diastolic dysfunction    Dizziness    Edema    ETD (eustachian tube dysfunction)    Heartburn    History of rectal bleeding    HTN (hypertension)    Hypercalcemia    Intracranial aneurysm    Low vision, one eye    Nephritis and nephropathy    Osteoporosis    Pyuria    Urge incontinence    Urinary frequency    Urinary retention     Surgical History: Past Surgical History:  Procedure Laterality Date   dilation of esophageal web     ESOPHAGOGASTRODUODENOSCOPY (EGD) WITH PROPOFOL N/A 12/06/2017   Procedure: ESOPHAGOGASTRODUODENOSCOPY (EGD) WITH PROPOFOL;  Surgeon: Midge Minium, MD;  Location: [facility];  Service: Endoscopy;  Laterality: N/A;   FRACTURE SURGERY Left    femur   HIP CLOSED REDUCTION Left 09/24/2018   Procedure: CLOSED MANIPULATION HIP unsuccessful;  Surgeon: Lyndle Herrlich, MD;  Location: [facility];  Service: Orthopedics;  Laterality: Left;   IR GENERIC HISTORICAL  08/05/2016   IR CATHETER TUBE CHANGE 08/05/2016 Irish Lack, MD [facility]   IR GENERIC HISTORICAL  09/17/2016   IR CATHETER TUBE CHANGE 09/17/2016 [facility]   IR GENERIC HISTORICAL  11/19/2016   IR CATHETER TUBE CHANGE 11/19/2016 Richarda Overlie, MD [facility]   IR GENERIC HISTORICAL  11/29/2016   IR CATHETER TUBE CHANGE 11/29/2016 Gilmer Mor, DO [facility]   JOINT REPLACEMENT Bilateral    TOTAL HIP ARTHROPLASTY WITH  HARDWARE REMOVAL Left 07/24/2018   Procedure: TOTAL HIP ARTHROPLASTY WITH HARDWARE REMOVAL-2 screws;  Surgeon: Christena Flake, MD;  Location: [facility];  Service: Orthopedics;  Laterality: Left;  picture taken, and placed on chart, of 2 screws that were removed    TOTAL HIP REVISION Left 09/25/2018   Procedure: TOTAL HIP REVISION-REMOVING FEMORAL COMPONENT;  Surgeon: Kennedy Bucker, MD;  Location: [facility];  Service: Orthopedics;  Laterality: Left;   TOTAL KNEE ARTHROPLASTY Bilateral    VAGINAL HYSTERECTOMY  1967   ovaries also removed because of fibroida    Home Medications:  Allergies as of 04/04/2023   No Known Allergies      Medication List        Accurate as of April 03, 2023  8:56 PM. If you have any questions, ask your nurse or doctor.          acetaminophen 500 MG tablet Commonly known as: TYLENOL Take 1,000 mg by mouth every 8 (eight) hours as needed for mild pain.   amLODipine 10 MG tablet Commonly known as: NORVASC Take 10 mg by mouth daily.   atenolol 25 MG tablet Commonly known as: TENORMIN Take 1 tablet (25 mg total) by mouth daily.   bisacodyl 10 MG suppository Commonly known as: DULCOLAX Place 10 mg rectally daily as needed for moderate constipation.   cholecalciferol  25 MCG (1000 UNIT) tablet Commonly known as: VITAMIN D3 Take 1,000 Units by mouth daily.   docusate sodium 100 MG capsule Commonly known as: COLACE Take 1 capsule (100 mg total) by mouth 2 (two) times daily.   FeroSul 325 (65 FE) MG tablet Generic drug: ferrous sulfate Take 325 mg by mouth 2 (two) times daily.   furosemide 20 MG tablet Commonly known as: LASIX Take 20 mg by mouth daily.   gabapentin 300 MG capsule Commonly known as: NEURONTIN Take 300 mg by mouth at bedtime.   gabapentin 100 MG capsule Commonly known as: NEURONTIN Take 100 mg by mouth daily.   HYDROcodone-acetaminophen 5-325 MG tablet Commonly known as: NORCO/VICODIN Take 1-2 tablets by mouth every 6 (six)  hours as needed for moderate pain or severe pain.   ipratropium-albuterol 0.5-2.5 (3) MG/3ML Soln Commonly known as: DUONEB Take 3 mLs by nebulization every 6 (six) hours as needed.   losartan 50 MG tablet Commonly known as: COZAAR Take 1 tablet (50 mg total) by mouth daily. Hold if SBP <110   losartan 25 MG tablet Commonly known as: COZAAR Take 25 mg by mouth daily.   multivitamin tablet Take 1 tablet by mouth daily.   oxybutynin 5 MG tablet Commonly known as: DITROPAN Take 5 mg by mouth as directed. 30 minutes prior to monthly catheter replacemeent   pantoprazole 40 MG tablet Commonly known as: PROTONIX Take 40 mg by mouth daily.   polyethylene glycol 17 g packet Commonly known as: MIRALAX / GLYCOLAX Take 17 g by mouth daily.        Allergies: No Known Allergies  Family History: Family History  Problem Relation Age of Onset   Kidney disease Neg Hx    Bladder Cancer Neg Hx    Kidney cancer Neg Hx     Social History:  reports that she quit smoking about 58 years ago. She has never used smokeless tobacco. She reports that she does not drink alcohol and does not use drugs.  ROS: Pertinent ROS in HPI  Physical Exam: There were no vitals taken for this visit.  Constitutional:  Well nourished. Alert and oriented, No acute distress. HEENT: [place] AT, moist mucus membranes.  Trachea midline, no masses. Cardiovascular: No clubbing, cyanosis, or edema. Respiratory: Normal respiratory effort, no increased work of breathing. GI: Abdomen is soft, non tender, non distended, no abdominal masses. Liver and spleen not palpable.  No hernias appreciated.  Stool sample for occult testing is not indicated.   GU: No CVA tenderness.  No bladder fullness or masses.  Patient with circumcised/uncircumcised phallus. ***Foreskin easily retracted***  Urethral meatus is patent.  No penile discharge. No penile lesions or rashes. Scrotum without lesions, cysts, rashes and/or edema.  Testicles are  located scrotally bilaterally. No masses are appreciated in the testicles. Left and right epididymis are normal. Rectal: Patient with  normal sphincter tone. Anus and perineum without scarring or rashes. No rectal masses are appreciated. Prostate is approximately *** grams, *** nodules are appreciated. Seminal vesicles are normal. Skin: No rashes, bruises or suspicious lesions. Lymph: No cervical or inguinal adenopathy. Neurologic: Grossly intact, no focal deficits, moving all 4 extremities. Psychiatric: Normal mood and affect.  Laboratory Data: Lab Results  Component Value Date   WBC 7.1 09/29/2018   HGB 7.9 (L) 09/29/2018   HCT 24.8 (L) 09/29/2018   MCV 84.4 09/29/2018   PLT 198 09/29/2018    Lab Results  Component Value Date   CREATININE 0.77 09/29/2018  No results found for: "PSA"  No results found for: "TESTOSTERONE"  No results found for: "HGBA1C"  Lab Results  Component Value Date   TSH 2.446 07/24/2018    No results found for: "CHOL", "HDL", "CHOLHDL", "VLDL", "LDLCALC"  Lab Results  Component Value Date   AST 15 09/28/2018   Lab Results  Component Value Date   ALT 6 09/28/2018   No components found for: "ALKALINEPHOPHATASE" No components found for: "BILIRUBINTOTAL"  No results found for: "ESTRADIOL"  Urinalysis    Component Value Date/Time   COLORURINE YELLOW (A) 09/27/2018 1225   APPEARANCEUR CLOUDY (A) 09/27/2018 1225   APPEARANCEUR Cloudy (A) 04/23/2016 1451   LABSPEC 1.013 09/27/2018 1225   LABSPEC 1.011 11/25/2011 1204   PHURINE 5.0 09/27/2018 1225   GLUCOSEU NEGATIVE 09/27/2018 1225   GLUCOSEU Negative 11/25/2011 1204   HGBUR MODERATE (A) 09/27/2018 1225   BILIRUBINUR NEGATIVE 09/27/2018 1225   BILIRUBINUR Negative 04/23/2016 1451   BILIRUBINUR Negative 11/25/2011 1204   KETONESUR NEGATIVE 09/27/2018 1225   PROTEINUR 30 (A) 09/27/2018 1225   NITRITE NEGATIVE 09/27/2018 1225   LEUKOCYTESUR LARGE (A) 09/27/2018 1225   LEUKOCYTESUR 3+  (A) 04/23/2016 1451   LEUKOCYTESUR Negative 11/25/2011 1204    I have reviewed the labs.   Pertinent Imaging: @CT @ @ultrasound @ @KUB @ I have independently reviewed the films.    Assessment & Plan:  ***  There are no diagnoses linked to this encounter.  No follow-ups on file.  These notes generated with voice recognition software. I apologize for typographical errors.  Cloretta Ned  [facility] [address]  [address] [place], [place] [address] [phone]

## 2023-04-04 ENCOUNTER — Ambulatory Visit: Payer: Medicare Other | Admitting: Urology

## 2023-06-19 DEATH — deceased
